# Patient Record
Sex: Female | Born: 1965 | Race: White | Hispanic: No | Marital: Married | Smoking: Current every day smoker
Health system: Southern US, Community
[De-identification: ages and names within clinical notes are randomized; demographics above are authoritative.]

## PROBLEM LIST (undated history)

## (undated) DIAGNOSIS — R011 Cardiac murmur, unspecified: Secondary | ICD-10-CM

## (undated) DIAGNOSIS — M545 Low back pain, unspecified: Secondary | ICD-10-CM

## (undated) DIAGNOSIS — N390 Urinary tract infection, site not specified: Secondary | ICD-10-CM

## (undated) DIAGNOSIS — Z9889 Other specified postprocedural states: Secondary | ICD-10-CM

## (undated) DIAGNOSIS — E119 Type 2 diabetes mellitus without complications: Secondary | ICD-10-CM

## (undated) DIAGNOSIS — F319 Bipolar disorder, unspecified: Secondary | ICD-10-CM

## (undated) DIAGNOSIS — R87619 Unspecified abnormal cytological findings in specimens from cervix uteri: Secondary | ICD-10-CM

## (undated) DIAGNOSIS — N183 Chronic kidney disease, stage 3 unspecified: Secondary | ICD-10-CM

## (undated) DIAGNOSIS — N200 Calculus of kidney: Secondary | ICD-10-CM

## (undated) DIAGNOSIS — K219 Gastro-esophageal reflux disease without esophagitis: Secondary | ICD-10-CM

## (undated) DIAGNOSIS — R531 Weakness: Secondary | ICD-10-CM

## (undated) DIAGNOSIS — K8020 Calculus of gallbladder without cholecystitis without obstruction: Secondary | ICD-10-CM

## (undated) DIAGNOSIS — Z8759 Personal history of other complications of pregnancy, childbirth and the puerperium: Secondary | ICD-10-CM

## (undated) DIAGNOSIS — G609 Hereditary and idiopathic neuropathy, unspecified: Secondary | ICD-10-CM

## (undated) DIAGNOSIS — Z8632 Personal history of gestational diabetes: Secondary | ICD-10-CM

## (undated) DIAGNOSIS — G43909 Migraine, unspecified, not intractable, without status migrainosus: Secondary | ICD-10-CM

## (undated) DIAGNOSIS — L97929 Non-pressure chronic ulcer of unspecified part of left lower leg with unspecified severity: Secondary | ICD-10-CM

## (undated) DIAGNOSIS — M1990 Unspecified osteoarthritis, unspecified site: Secondary | ICD-10-CM

## (undated) DIAGNOSIS — I10 Essential (primary) hypertension: Secondary | ICD-10-CM

## (undated) DIAGNOSIS — F419 Anxiety disorder, unspecified: Secondary | ICD-10-CM

## (undated) DIAGNOSIS — L97509 Non-pressure chronic ulcer of other part of unspecified foot with unspecified severity: Secondary | ICD-10-CM

## (undated) DIAGNOSIS — N3090 Cystitis, unspecified without hematuria: Secondary | ICD-10-CM

## (undated) DIAGNOSIS — B370 Candidal stomatitis: Secondary | ICD-10-CM

## (undated) DIAGNOSIS — F329 Major depressive disorder, single episode, unspecified: Secondary | ICD-10-CM

## (undated) DIAGNOSIS — R319 Hematuria, unspecified: Secondary | ICD-10-CM

## (undated) DIAGNOSIS — H269 Unspecified cataract: Secondary | ICD-10-CM

## (undated) DIAGNOSIS — Z8619 Personal history of other infectious and parasitic diseases: Secondary | ICD-10-CM

## (undated) DIAGNOSIS — E669 Obesity, unspecified: Secondary | ICD-10-CM

## (undated) DIAGNOSIS — R112 Nausea with vomiting, unspecified: Secondary | ICD-10-CM

## (undated) DIAGNOSIS — IMO0002 Reserved for concepts with insufficient information to code with codable children: Secondary | ICD-10-CM

## (undated) DIAGNOSIS — E785 Hyperlipidemia, unspecified: Secondary | ICD-10-CM

## (undated) DIAGNOSIS — Z87442 Personal history of urinary calculi: Secondary | ICD-10-CM

## (undated) DIAGNOSIS — K5730 Diverticulosis of large intestine without perforation or abscess without bleeding: Secondary | ICD-10-CM

## (undated) DIAGNOSIS — Z9289 Personal history of other medical treatment: Secondary | ICD-10-CM

## (undated) DIAGNOSIS — G4730 Sleep apnea, unspecified: Secondary | ICD-10-CM

## (undated) DIAGNOSIS — F32A Depression, unspecified: Secondary | ICD-10-CM

## (undated) DIAGNOSIS — I639 Cerebral infarction, unspecified: Secondary | ICD-10-CM

## (undated) DIAGNOSIS — A630 Anogenital (venereal) warts: Secondary | ICD-10-CM

## (undated) DIAGNOSIS — N179 Acute kidney failure, unspecified: Secondary | ICD-10-CM

## (undated) DIAGNOSIS — J189 Pneumonia, unspecified organism: Secondary | ICD-10-CM

## (undated) HISTORY — PX: CERVICAL CONE BIOPSY: SUR198

## (undated) HISTORY — PX: WISDOM TOOTH EXTRACTION: SHX21

## (undated) HISTORY — DX: Anogenital (venereal) warts: A63.0

## (undated) HISTORY — PX: ECTOPIC PREGNANCY SURGERY: SHX613

## (undated) HISTORY — DX: Unspecified abnormal cytological findings in specimens from cervix uteri: R87.619

## (undated) HISTORY — DX: Anxiety disorder, unspecified: F41.9

## (undated) HISTORY — PX: OVARIAN CYST REMOVAL: SHX89

## (undated) HISTORY — DX: Essential (primary) hypertension: I10

## (undated) HISTORY — PX: DILATION AND CURETTAGE OF UTERUS: SHX78

## (undated) HISTORY — PX: UNILATERAL SALPINGECTOMY: SHX6160

## (undated) HISTORY — DX: Hyperlipidemia, unspecified: E78.5

## (undated) HISTORY — DX: Reserved for concepts with insufficient information to code with codable children: IMO0002

---
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
## 1997-05-07 ENCOUNTER — Ambulatory Visit (HOSPITAL_COMMUNITY): Admission: RE | Admit: 1997-05-07 | Discharge: 1997-05-07 | Payer: Self-pay | Admitting: Obstetrics and Gynecology

## 1997-06-08 ENCOUNTER — Other Ambulatory Visit: Admission: RE | Admit: 1997-06-08 | Discharge: 1997-06-08 | Payer: Self-pay | Admitting: Obstetrics and Gynecology

## 1997-07-27 ENCOUNTER — Observation Stay (HOSPITAL_COMMUNITY): Admission: AD | Admit: 1997-07-27 | Discharge: 1997-07-27 | Payer: Self-pay | Admitting: Obstetrics and Gynecology

## 1997-10-11 ENCOUNTER — Ambulatory Visit (HOSPITAL_COMMUNITY): Admission: RE | Admit: 1997-10-11 | Discharge: 1997-10-11 | Payer: Self-pay | Admitting: Obstetrics and Gynecology

## 1997-10-23 ENCOUNTER — Encounter: Admission: RE | Admit: 1997-10-23 | Discharge: 1998-01-21 | Payer: Self-pay | Admitting: Obstetrics and Gynecology

## 1997-12-22 ENCOUNTER — Inpatient Hospital Stay: Admission: AD | Admit: 1997-12-22 | Discharge: 1997-12-22 | Payer: Self-pay | Admitting: Obstetrics and Gynecology

## 1997-12-25 ENCOUNTER — Inpatient Hospital Stay (HOSPITAL_COMMUNITY): Admission: AD | Admit: 1997-12-25 | Discharge: 1997-12-28 | Payer: Self-pay | Admitting: Obstetrics and Gynecology

## 1997-12-28 ENCOUNTER — Encounter (HOSPITAL_COMMUNITY): Admission: RE | Admit: 1997-12-28 | Discharge: 1998-03-28 | Payer: Self-pay | Admitting: Obstetrics and Gynecology

## 1998-02-11 ENCOUNTER — Other Ambulatory Visit: Admission: RE | Admit: 1998-02-11 | Discharge: 1998-02-11 | Payer: Self-pay | Admitting: Obstetrics and Gynecology

## 2000-02-25 ENCOUNTER — Other Ambulatory Visit: Admission: RE | Admit: 2000-02-25 | Discharge: 2000-02-25 | Payer: Self-pay | Admitting: Obstetrics and Gynecology

## 2000-04-08 ENCOUNTER — Inpatient Hospital Stay (HOSPITAL_COMMUNITY): Admission: AD | Admit: 2000-04-08 | Discharge: 2000-04-12 | Payer: Self-pay | Admitting: Obstetrics and Gynecology

## 2000-04-10 ENCOUNTER — Encounter: Payer: Self-pay | Admitting: Obstetrics and Gynecology

## 2000-04-11 ENCOUNTER — Encounter: Payer: Self-pay | Admitting: Pulmonary Disease

## 2000-10-28 ENCOUNTER — Other Ambulatory Visit: Admission: RE | Admit: 2000-10-28 | Discharge: 2000-10-28 | Payer: Self-pay | Admitting: Obstetrics and Gynecology

## 2001-03-08 ENCOUNTER — Encounter: Admission: RE | Admit: 2001-03-08 | Discharge: 2001-06-06 | Payer: Self-pay | Admitting: Obstetrics and Gynecology

## 2001-04-05 ENCOUNTER — Encounter: Payer: Self-pay | Admitting: Obstetrics and Gynecology

## 2001-04-05 ENCOUNTER — Inpatient Hospital Stay (HOSPITAL_COMMUNITY): Admission: AD | Admit: 2001-04-05 | Discharge: 2001-04-05 | Payer: Self-pay | Admitting: Obstetrics and Gynecology

## 2001-04-21 ENCOUNTER — Inpatient Hospital Stay (HOSPITAL_COMMUNITY): Admission: AD | Admit: 2001-04-21 | Discharge: 2001-04-24 | Payer: Self-pay | Admitting: Obstetrics and Gynecology

## 2001-04-21 ENCOUNTER — Encounter (INDEPENDENT_AMBULATORY_CARE_PROVIDER_SITE_OTHER): Payer: Self-pay

## 2001-06-14 ENCOUNTER — Other Ambulatory Visit: Admission: RE | Admit: 2001-06-14 | Discharge: 2001-06-14 | Payer: Self-pay | Admitting: Obstetrics and Gynecology

## 2001-06-17 ENCOUNTER — Inpatient Hospital Stay (HOSPITAL_COMMUNITY): Admission: AD | Admit: 2001-06-17 | Discharge: 2001-06-17 | Payer: Self-pay | Admitting: Obstetrics & Gynecology

## 2003-05-28 ENCOUNTER — Ambulatory Visit (HOSPITAL_COMMUNITY): Admission: RE | Admit: 2003-05-28 | Discharge: 2003-05-28 | Payer: Self-pay | Admitting: Family Medicine

## 2003-05-28 IMAGING — CR DG ANKLE COMPLETE 3+V*L*
2 series · 2 of 2 positions shown · non-contrast
Comparison: none

CLINICAL DATA: Status post fall with pain along dorsal surface of foot and ankle.
 LEFT FOOT 3 VIEWS  - [DATE]
 No acute bony abnormality.  Specifically, no evidence for fracture, subluxation, or dislocation.  Soft tissues are intact.
 IMPRESSION
 No acute bony abnormality.
 LEFT ANKLE 3 VIEWS ? [DATE]
 There is an area of cortical thickening noted within the distal lateral aspect of the left tibia.  This could represent a stress reaction or possibly an ossifying fibroma.  This has a benign appearance.  No acute bony abnormality.  Specifically no evidence of fracture, subluxation, or dislocation about the ankle.
 1.  Sclerotic area with cortical thickening in the distal lateral left tibia most compatible with a benign process such as stress reaction or ossifying fibroma or other benign condition.
 2.  No acute bony abnormality.

[view not recorded (1 of 2)]
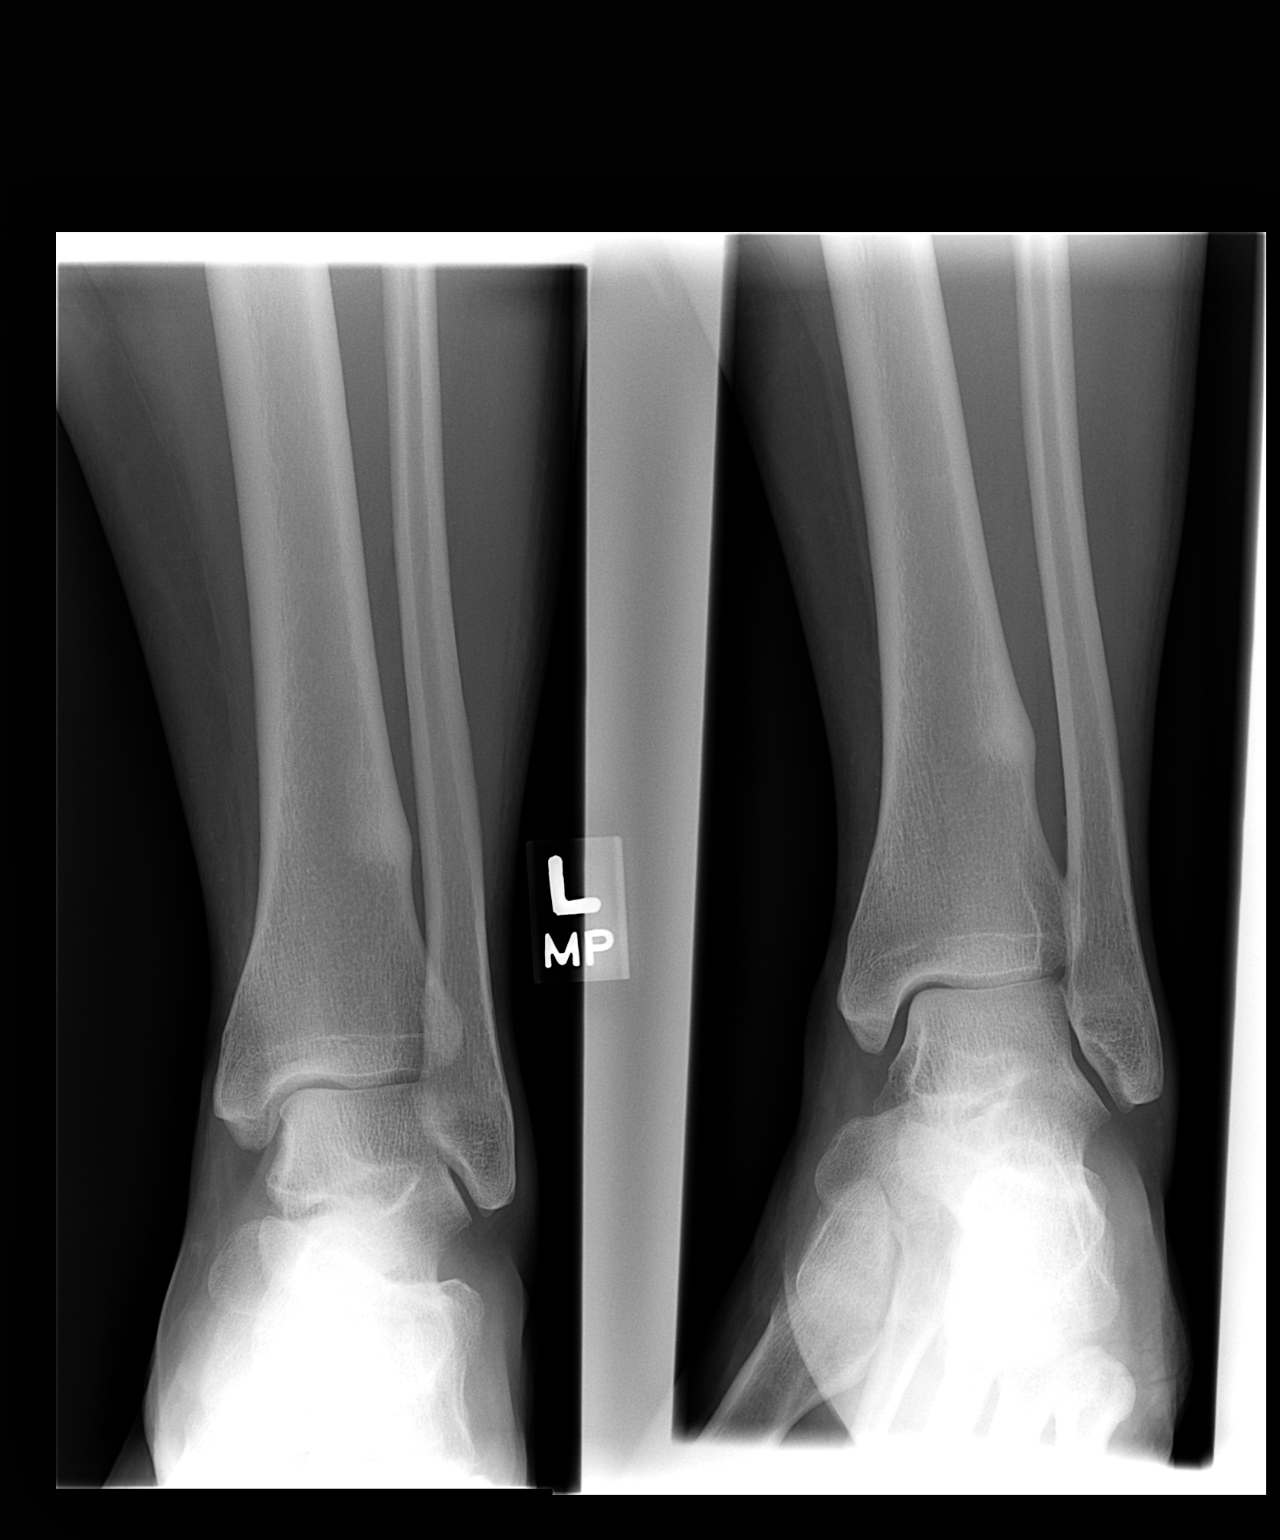

[view not recorded (2 of 2)]
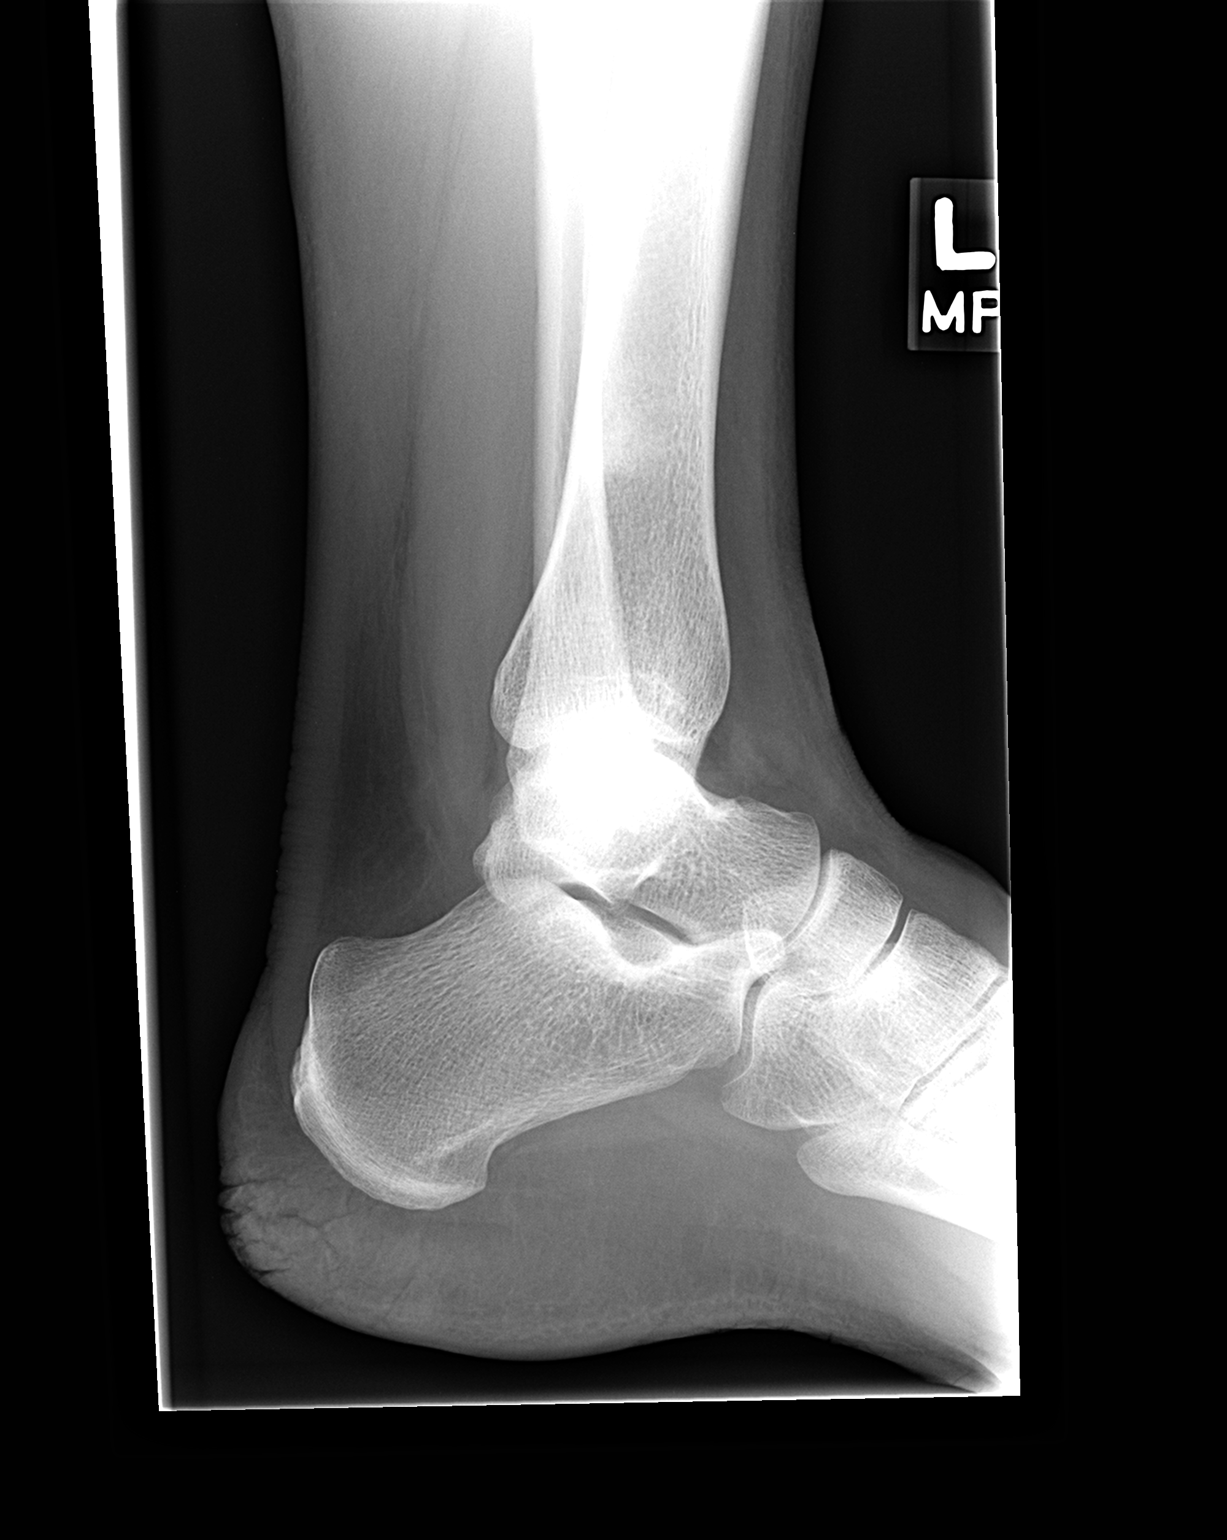

[2 of 2 positions shown; findings below may reference images not displayed]

## 2003-05-28 IMAGING — CR DG FOOT COMPLETE 3+V*L*
2 series · 2 of 2 positions shown · non-contrast
Comparison: none

CLINICAL DATA: Status post fall with pain along dorsal surface of foot and ankle.
 LEFT FOOT 3 VIEWS  - [DATE]
 No acute bony abnormality.  Specifically, no evidence for fracture, subluxation, or dislocation.  Soft tissues are intact.
 IMPRESSION
 No acute bony abnormality.
 LEFT ANKLE 3 VIEWS ? [DATE]
 There is an area of cortical thickening noted within the distal lateral aspect of the left tibia.  This could represent a stress reaction or possibly an ossifying fibroma.  This has a benign appearance.  No acute bony abnormality.  Specifically no evidence of fracture, subluxation, or dislocation about the ankle.
 1.  Sclerotic area with cortical thickening in the distal lateral left tibia most compatible with a benign process such as stress reaction or ossifying fibroma or other benign condition.
 2.  No acute bony abnormality.

[view not recorded (1 of 2)]
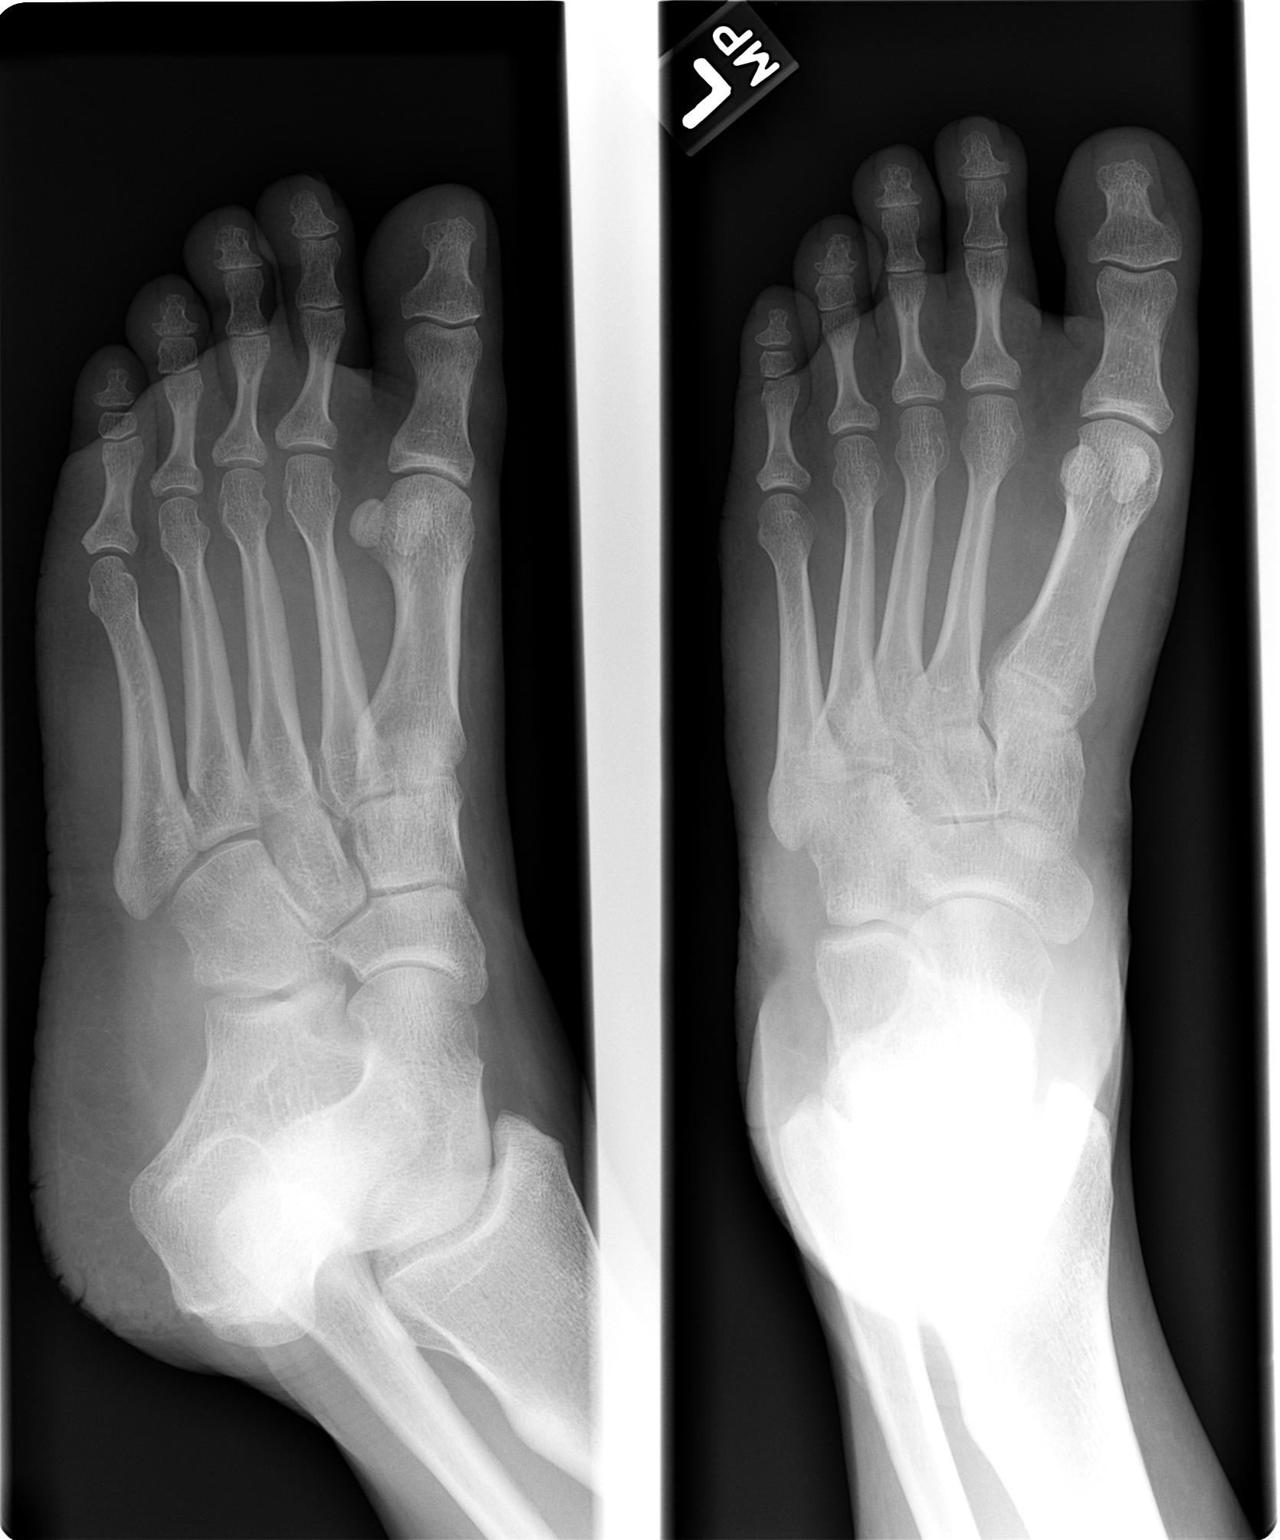

[view not recorded (2 of 2)]
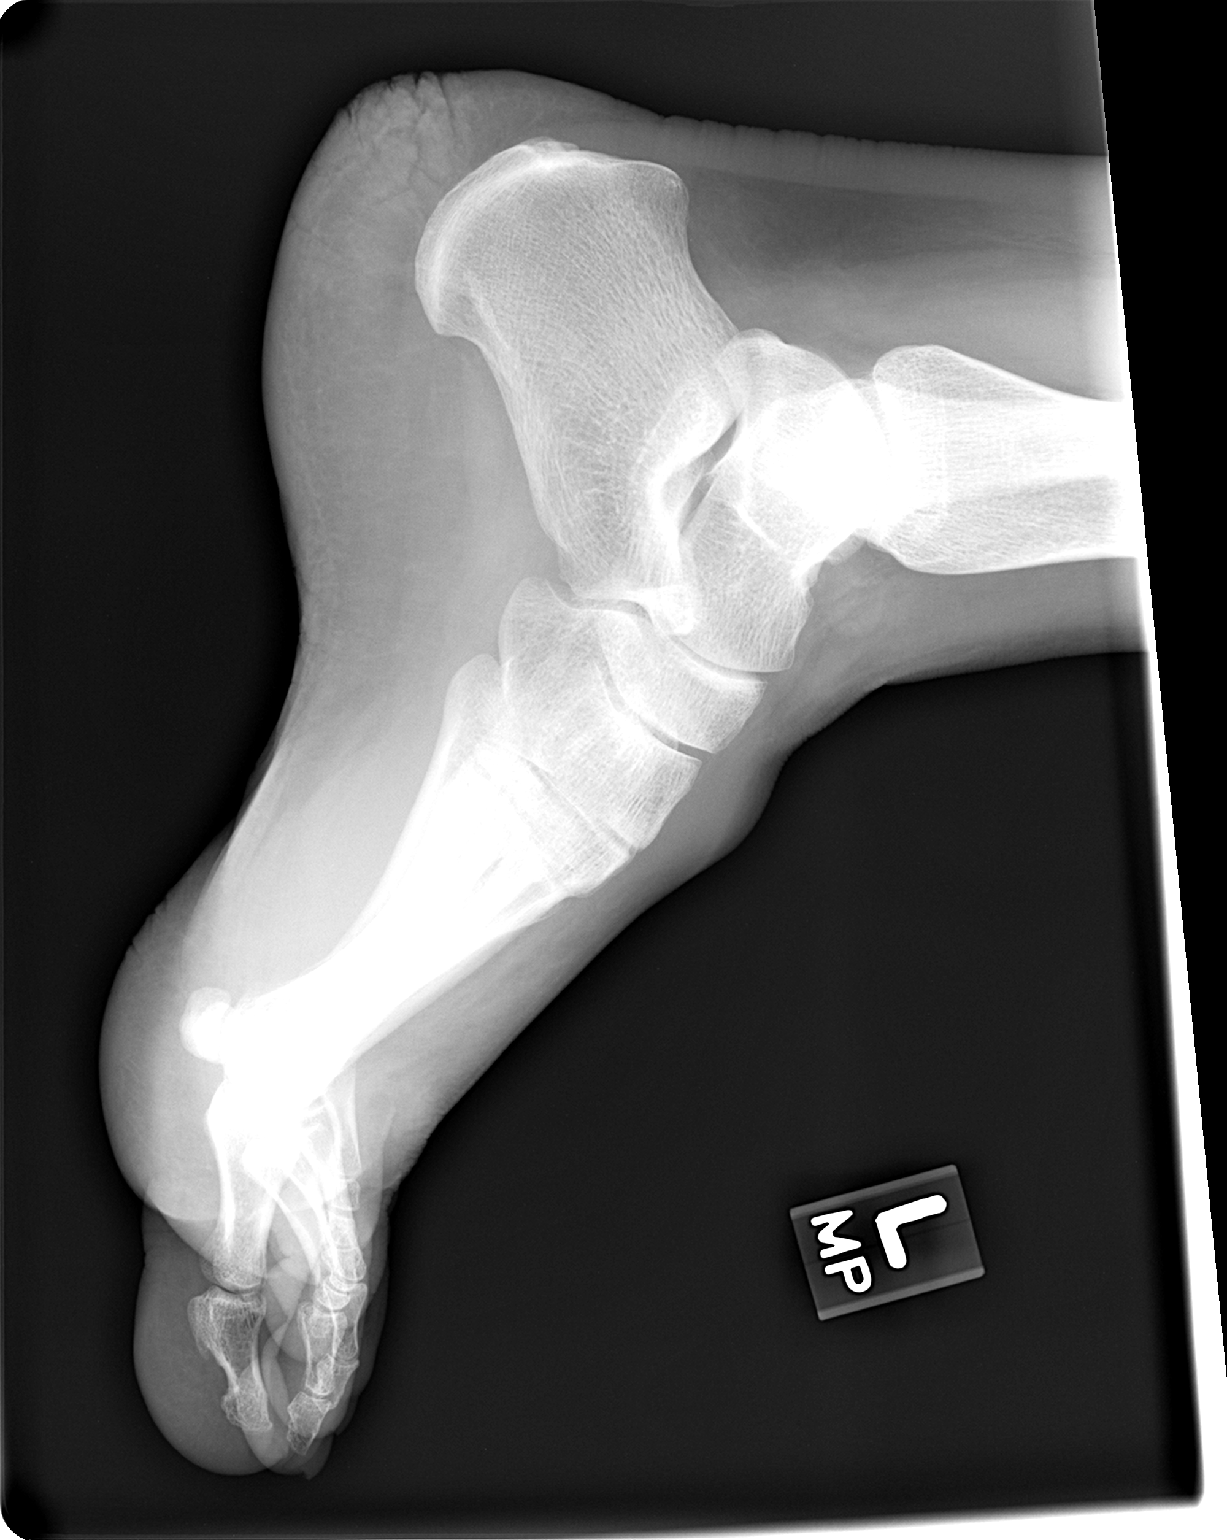

[2 of 2 positions shown; findings below may reference images not displayed]

## 2003-10-17 ENCOUNTER — Ambulatory Visit (HOSPITAL_COMMUNITY): Admission: RE | Admit: 2003-10-17 | Discharge: 2003-10-17 | Payer: Self-pay | Admitting: Obstetrics and Gynecology

## 2003-10-17 IMAGING — US US OB TRANSVAGINAL MODIFY
1 series · 14 of 26 positions shown · non-contrast
Comparison: none

CLINICAL DATA: LMP [DATE] P3 AB2 Ectopic 1.  Assess for fetal life.
 EARLY OBSTETRICAL ULTRASOUND WITH TRANSVAGINAL:
 Transabdominal and transvaginal scanning of the pelvis was performed. Study is difficult due to patient?s large size.
 There is an intrauterine gestational sac containing a single living embryo with a regular heart rate of 147 bpm.  By crown rump length, the gestation is estimated at 7 weeks 3 days.  This yields sonographic EDC of [DATE].  It was difficult to clearly define the yolk sac.  No subchorionic hemorrhage is noted.
 Left ovary is normal.  Right ovary could not be seen by either scanning route.  
 IMPRESSION
 Single living intrauterine embryo with mean gestational age of 7 weeks 3 days and sonographic EDC of [DATE].  
 Normal left ovary.  Right ovary not seen.

[Series 1: unknown · 0.41mm/px · 14 of 26 slices shown]
[im 1/26]
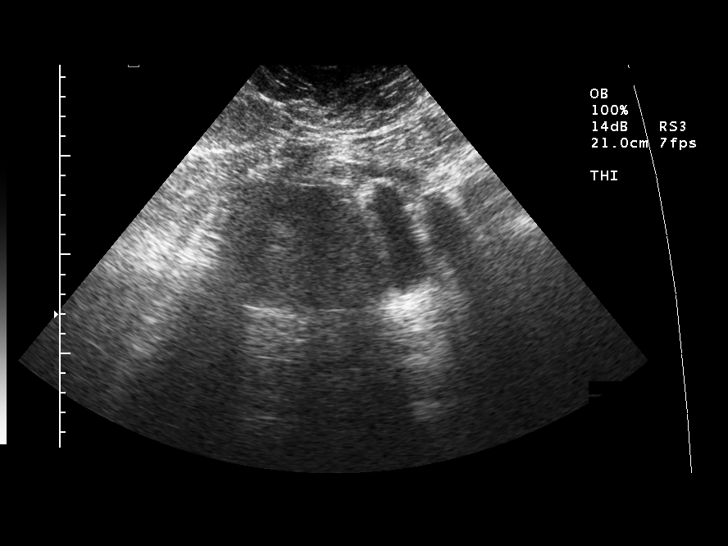
[im 3/26]
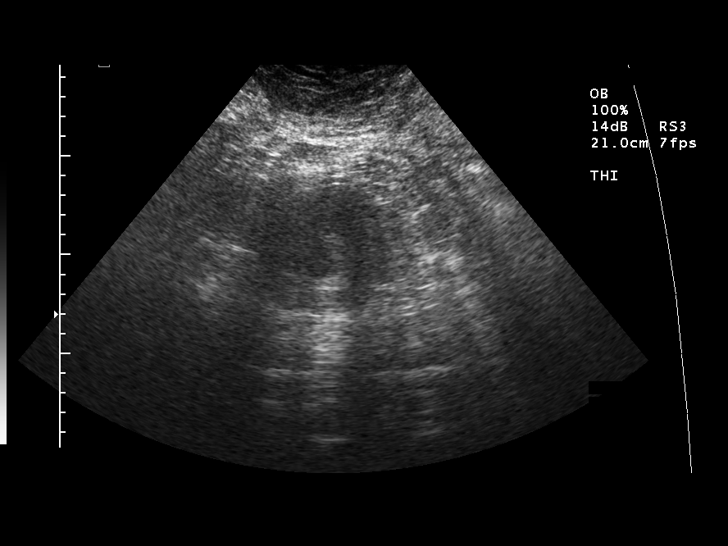
[im 5/26]
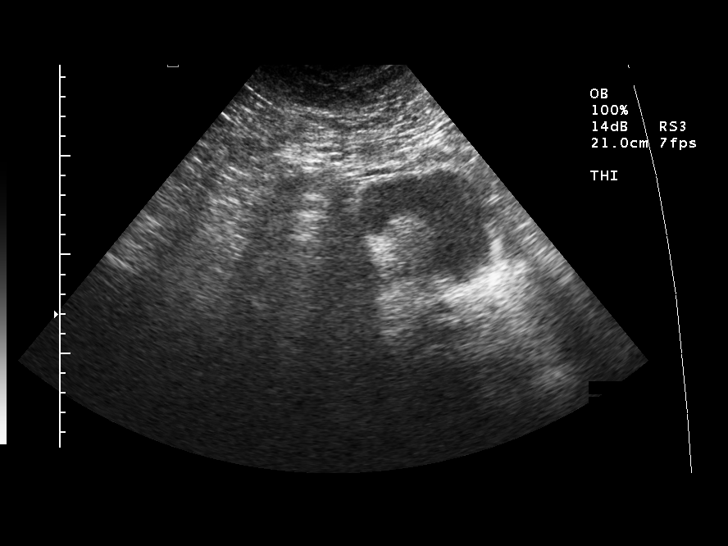
[im 7/26]
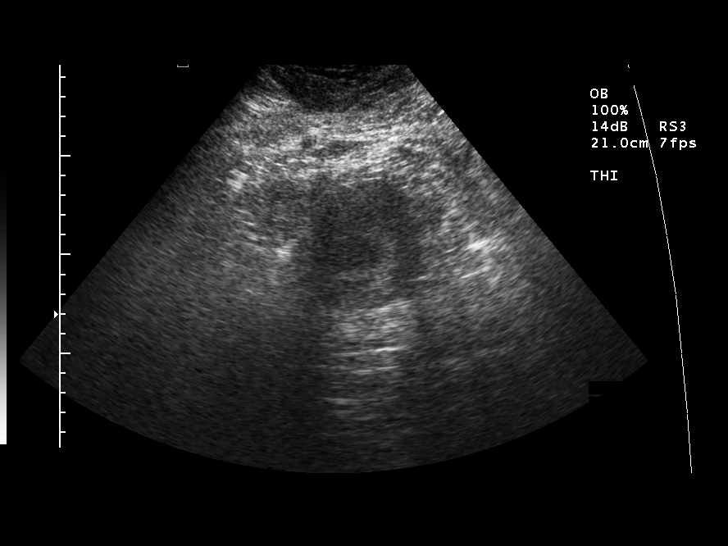
[im 9/26]
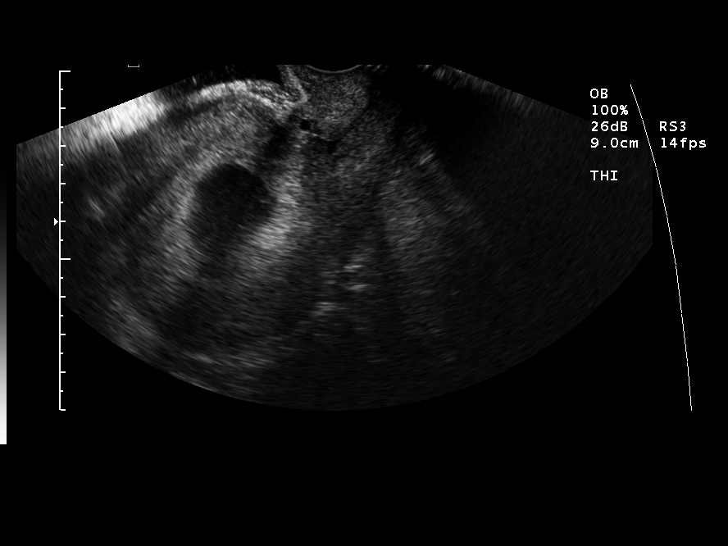
[im 11/26]
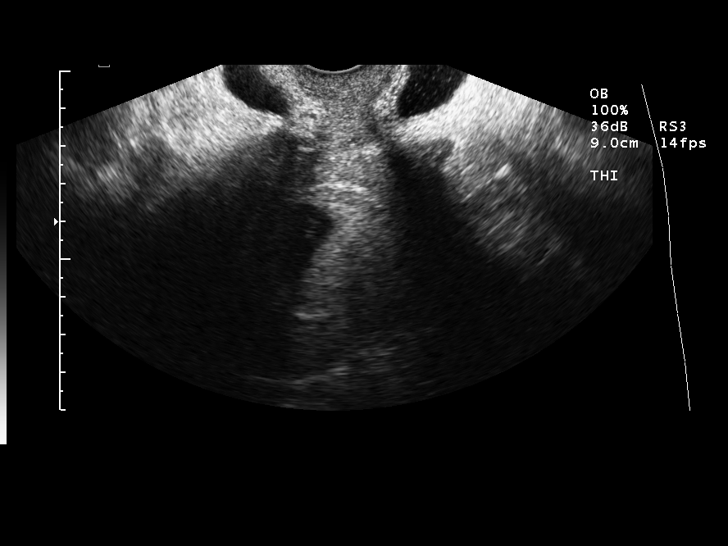
[im 13/26]
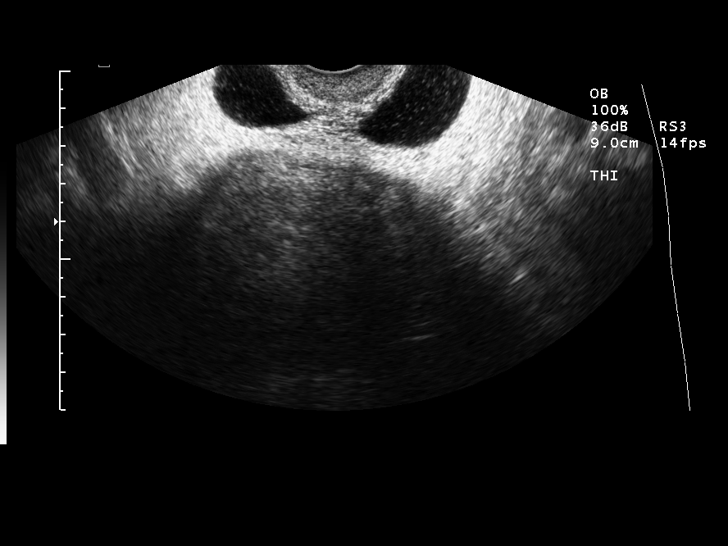
[im 14/26]
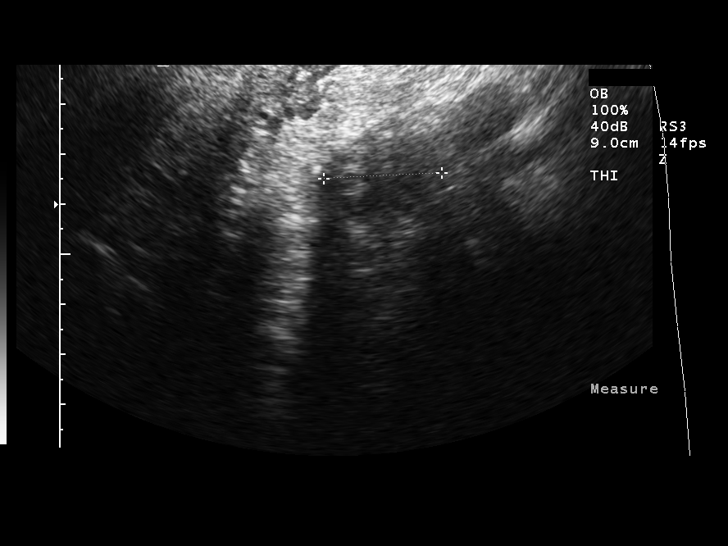
[im 16/26]
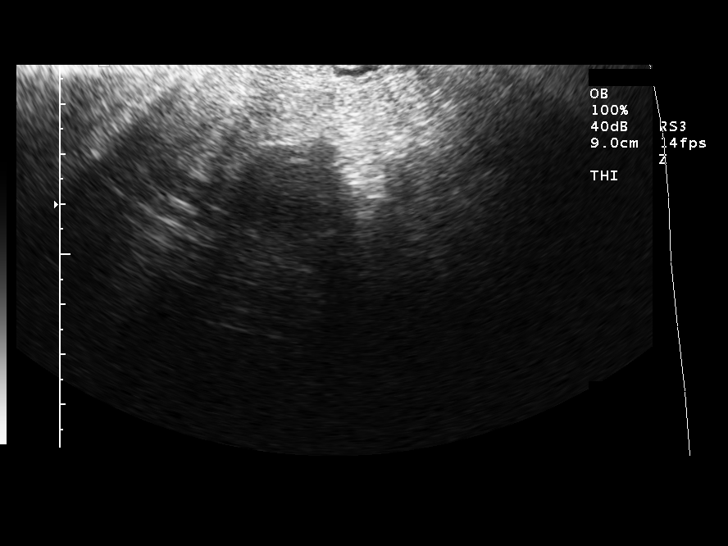
[im 18/26]
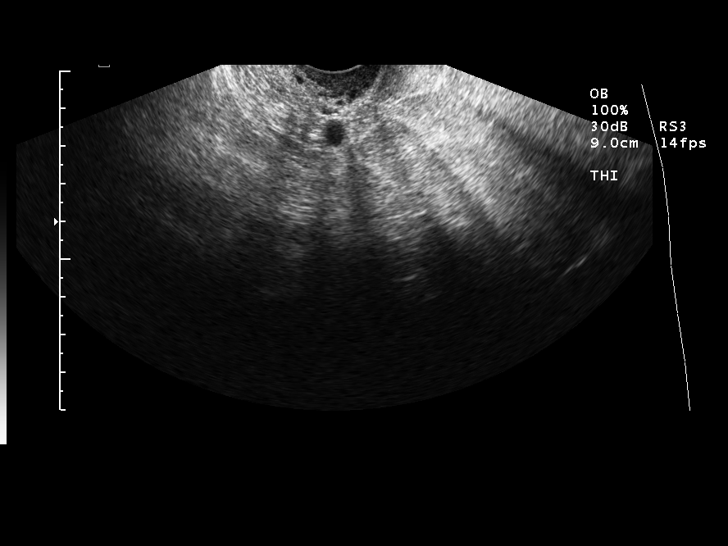
[im 20/26]
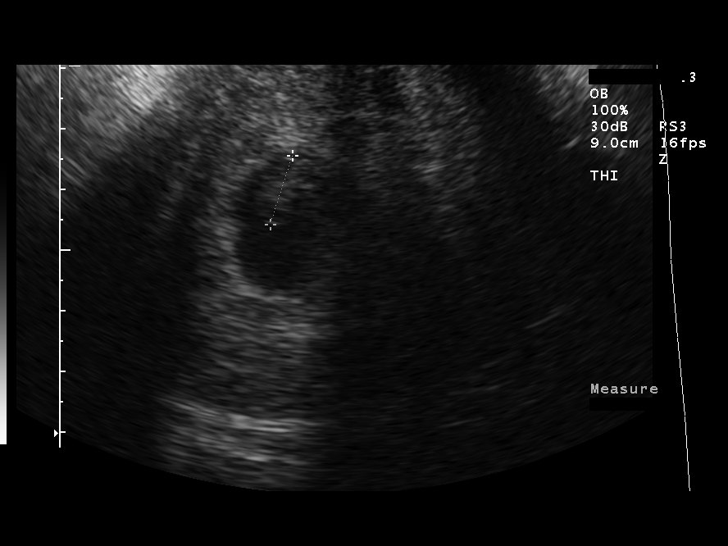
[im 22/26]
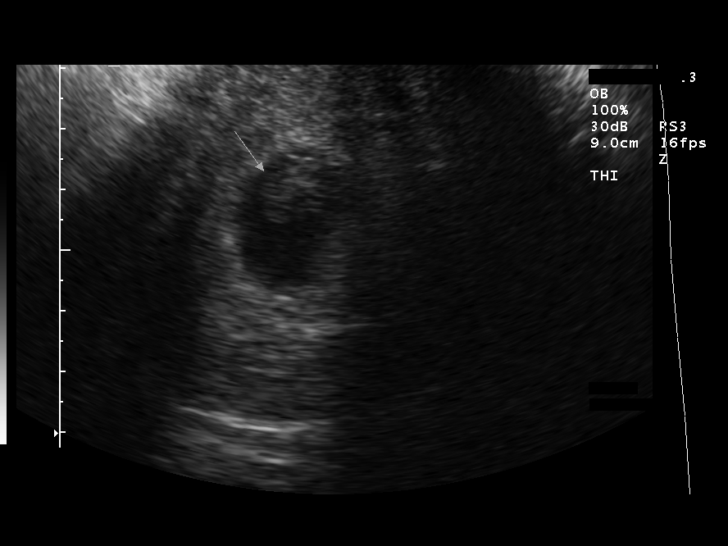
[im 24/26]
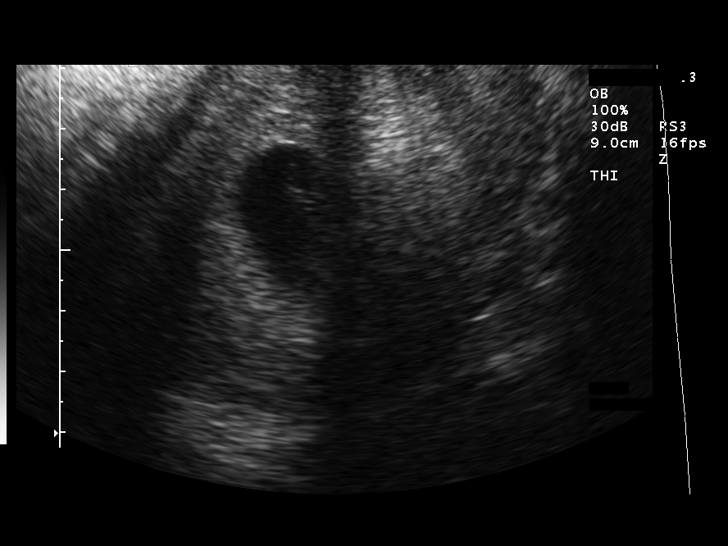
[im 26/26]
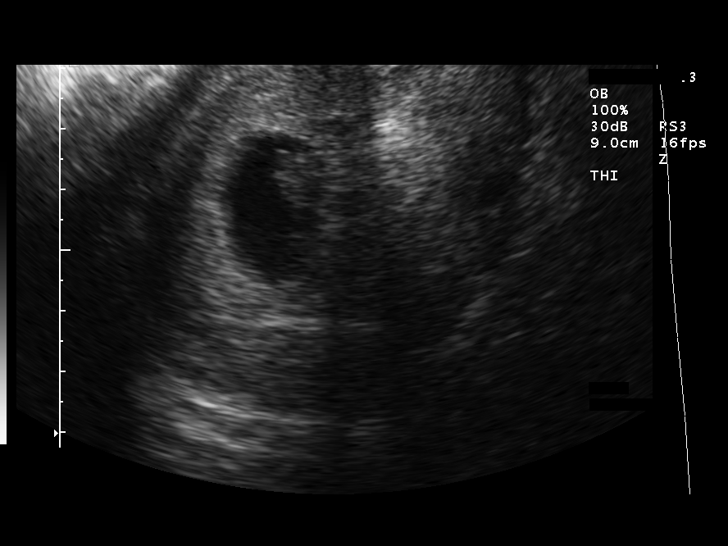

[14 of 26 positions shown; findings below may reference images not displayed]

## 2003-10-31 ENCOUNTER — Encounter: Admission: RE | Admit: 2003-10-31 | Discharge: 2003-10-31 | Payer: Self-pay | Admitting: *Deleted

## 2003-11-14 ENCOUNTER — Encounter: Admission: RE | Admit: 2003-11-14 | Discharge: 2003-11-14 | Payer: Self-pay | Admitting: *Deleted

## 2003-11-14 ENCOUNTER — Encounter (INDEPENDENT_AMBULATORY_CARE_PROVIDER_SITE_OTHER): Payer: Self-pay | Admitting: *Deleted

## 2003-11-15 ENCOUNTER — Encounter: Admission: RE | Admit: 2003-11-15 | Discharge: 2003-11-15 | Payer: Self-pay | Admitting: Family Medicine

## 2003-11-28 ENCOUNTER — Ambulatory Visit: Payer: Self-pay | Admitting: *Deleted

## 2003-12-05 ENCOUNTER — Ambulatory Visit: Payer: Self-pay | Admitting: Family Medicine

## 2003-12-19 ENCOUNTER — Ambulatory Visit: Payer: Self-pay | Admitting: Obstetrics & Gynecology

## 2003-12-24 ENCOUNTER — Inpatient Hospital Stay (HOSPITAL_COMMUNITY): Admission: AD | Admit: 2003-12-24 | Discharge: 2003-12-24 | Payer: Self-pay | Admitting: Family Medicine

## 2003-12-31 ENCOUNTER — Inpatient Hospital Stay (HOSPITAL_COMMUNITY): Admission: AD | Admit: 2003-12-31 | Discharge: 2003-12-31 | Payer: Self-pay | Admitting: *Deleted

## 2004-01-01 ENCOUNTER — Encounter: Admission: RE | Admit: 2004-01-01 | Discharge: 2004-01-01 | Payer: Self-pay | Admitting: *Deleted

## 2004-01-02 ENCOUNTER — Inpatient Hospital Stay (HOSPITAL_COMMUNITY): Admission: RE | Admit: 2004-01-02 | Discharge: 2004-01-06 | Payer: Self-pay | Admitting: Family Medicine

## 2004-01-02 ENCOUNTER — Ambulatory Visit: Payer: Self-pay | Admitting: Obstetrics and Gynecology

## 2004-01-02 IMAGING — US US OB DETAIL+14 WK
1 series · 13 of 28 positions shown · non-contrast
Comparison: none

CLINICAL DATA: 18 week 3 day assigned gestational age by early ultrasound.  Advanced maternal age.  History of cervical incompetence.

[Series 1: us ob detail+14 wk · 0.47mm/px · 13 of 68 slices shown]
[im 3/68]
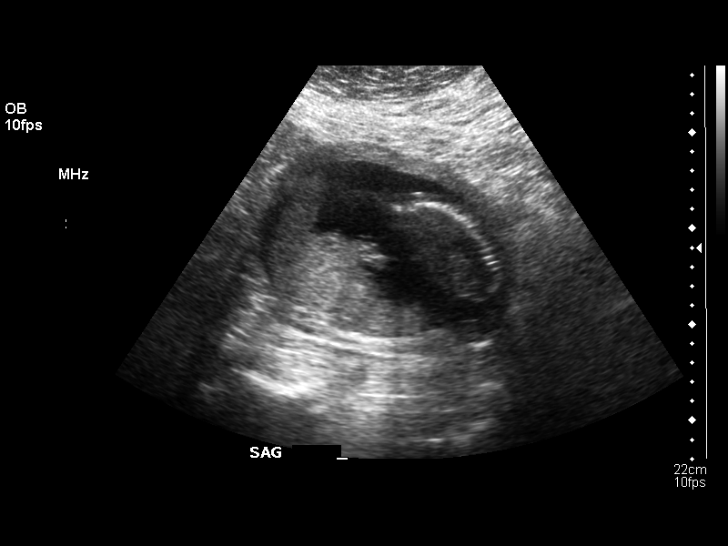
[im 8/68]
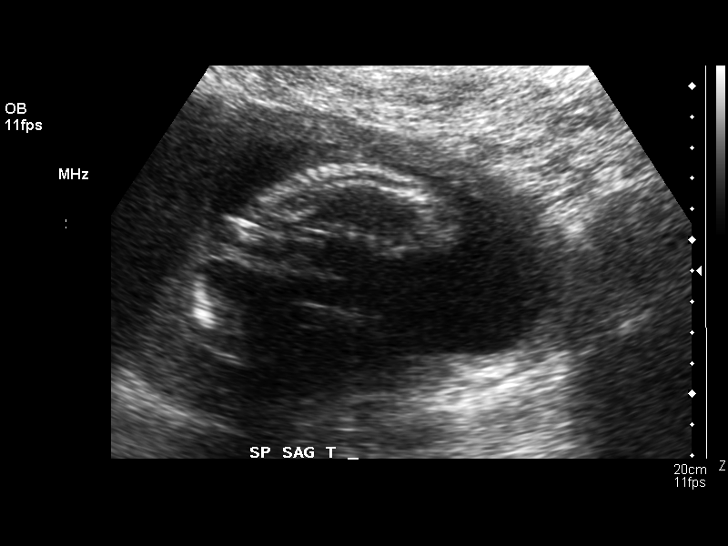
[im 13/68]
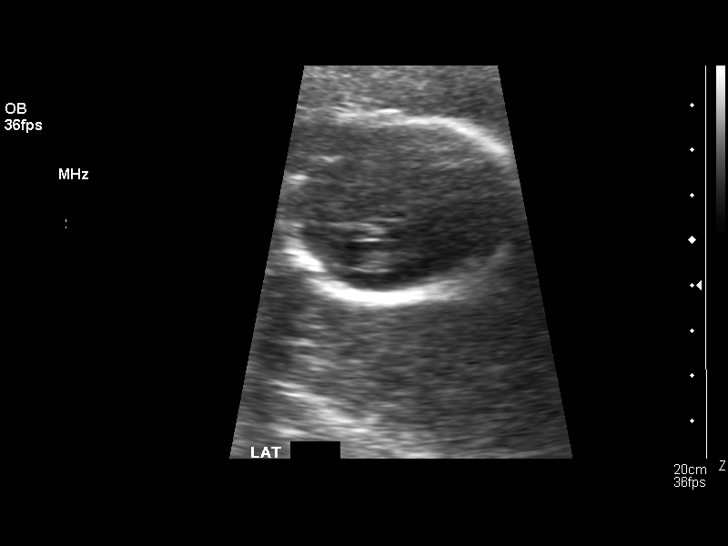
[im 18/68]
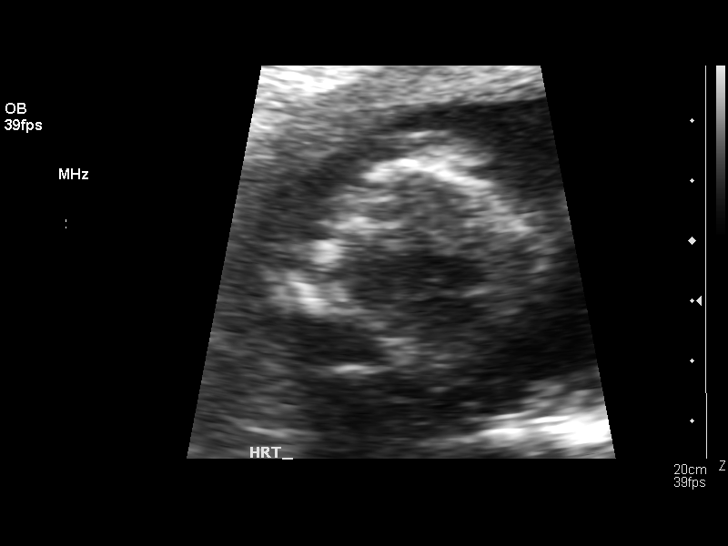
[im 23/68]
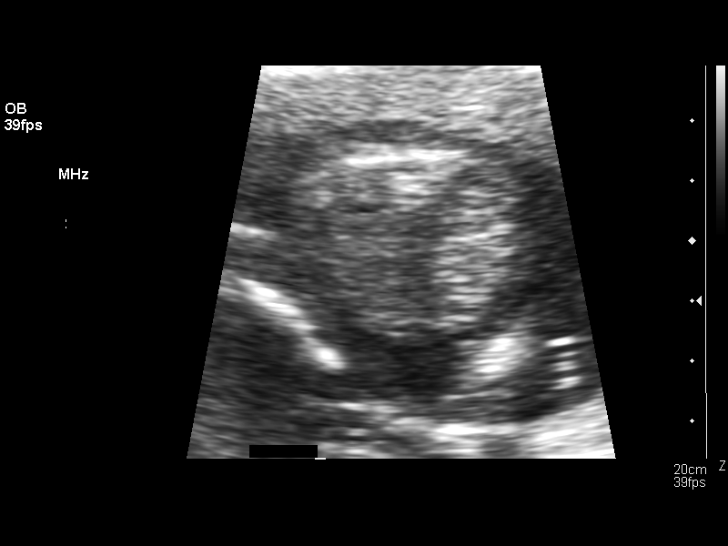
[im 28/68]
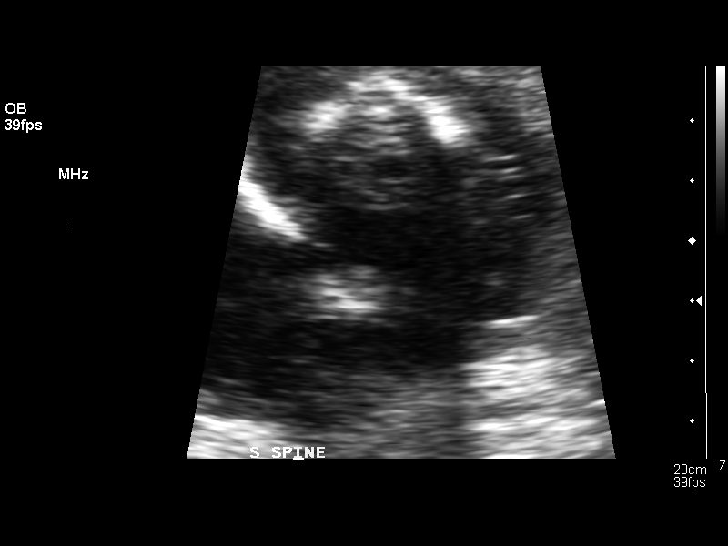
[im 35/68]
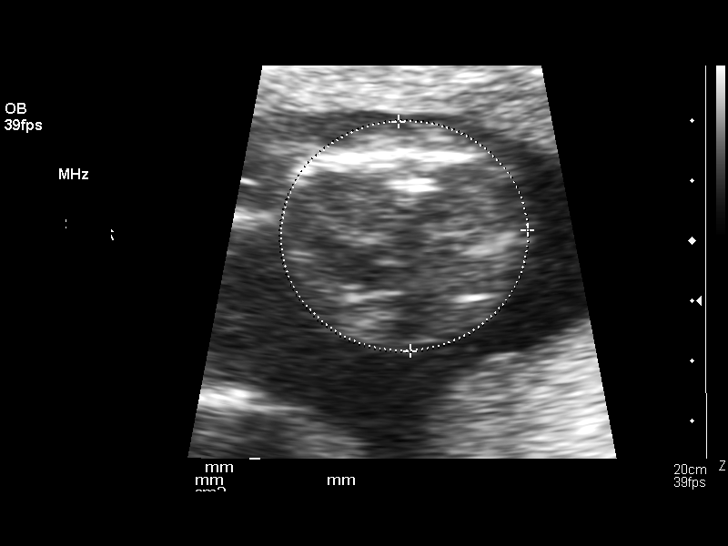
[im 40/68]
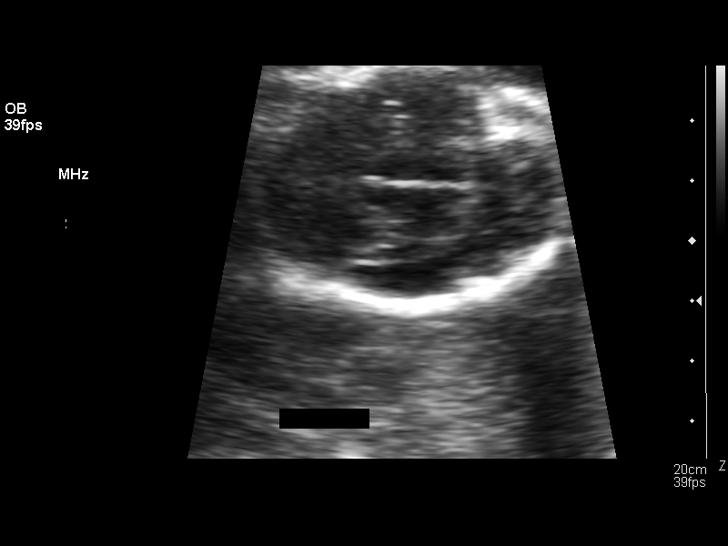
[im 45/68]
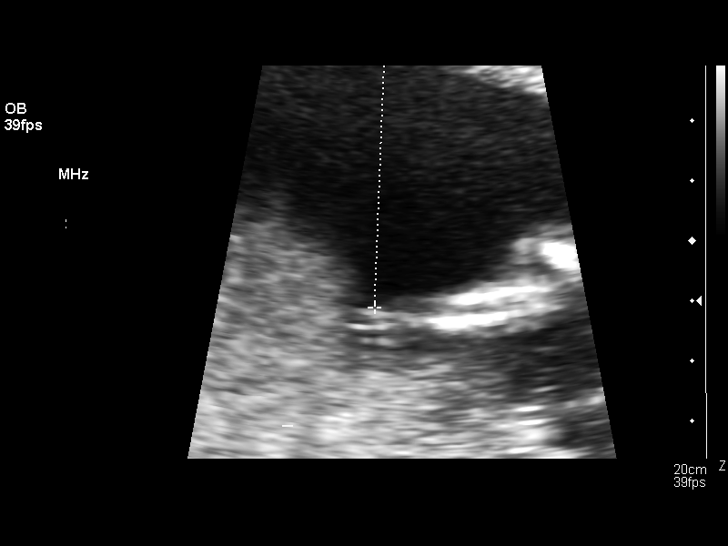
[im 50/68]
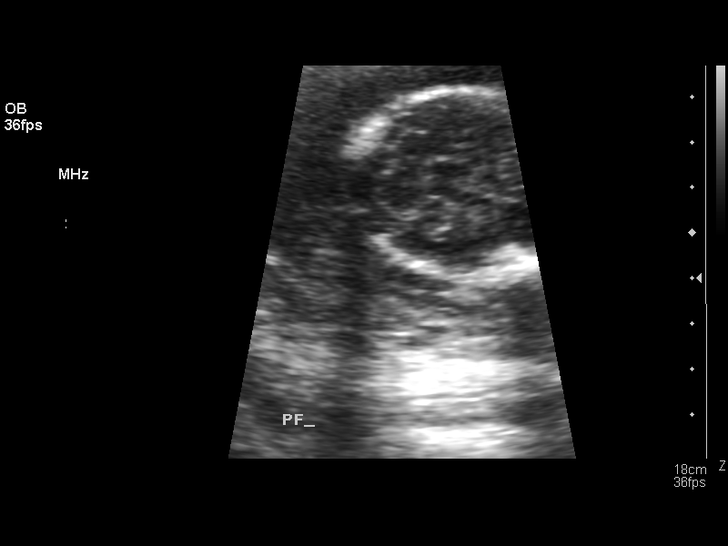
[im 55/68]
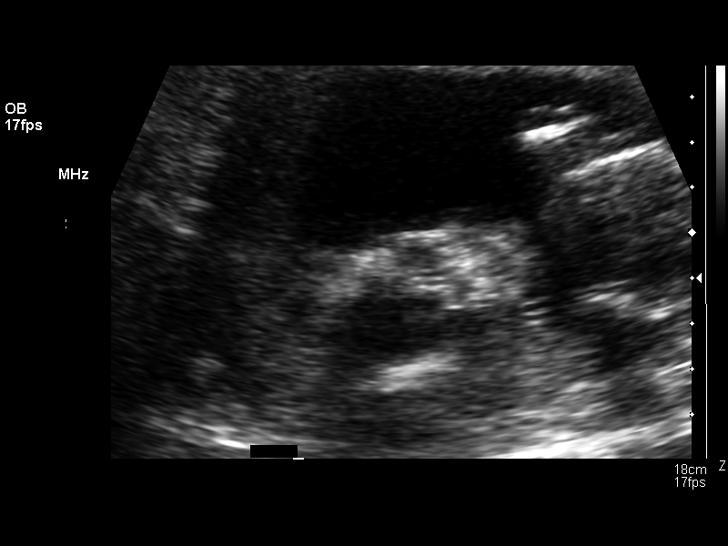
[im 60/68]
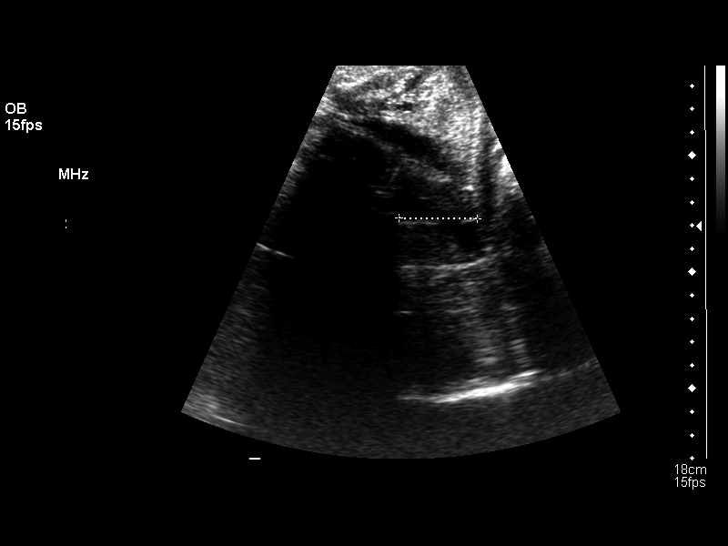
[im 65/68]
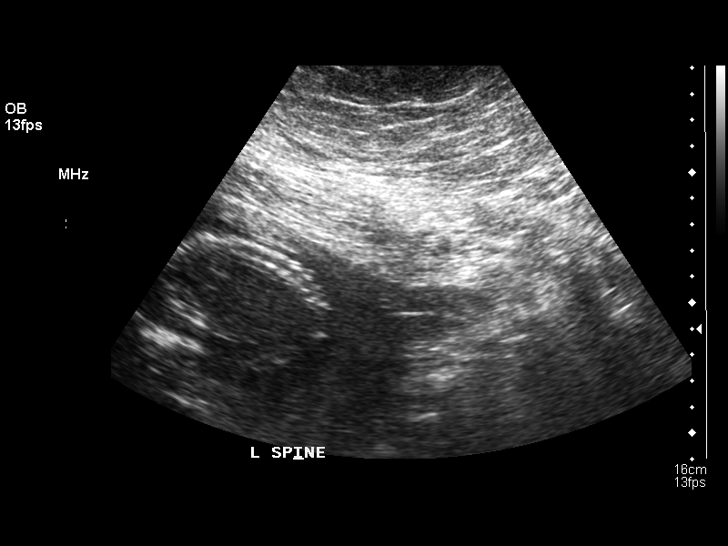

[13 of 28 positions shown; findings below may reference images not displayed]

DETAILED OBSTETRICAL ULTRASOUND WITH TRANSVAGINAL:

 Number of Fetuses:  1
 Heart Rate:  168
 Movement:  Yes
 Breathing:  No
 Presentation:  Breech
 Placental Location:  Fundal, posterior
 Grade:  I
 Previa:  No
 Amniotic Fluid (Subjective):  Normal
 Amniotic Fluid (Objective):  4.3 cm Vertical pocket 

 FETAL BIOMETRY
 BPD:  4.1 cm   18 w 3 d
 HC:  15.6 cm   18 w 4 d
 AC:  12.7 cm  18 w 2 d
 FL:   3.4 cm  20 w 5 d
 HL:   2.9 cm   19 w 2 d

 MEAN GA:  19 w 0 d

 FETAL ANATOMY
 Lateral Ventricles:  Visualized 
 Thalami/CSP:  Visualized 
 Posterior Fossa:  Visualized 
 Nuchal Region:  Visualized 
 Spine:  Limited
 4 Chamber Heart on Left:  Not visualized 
 Stomach on Left:  Visualized 
 3 Vessel Cord:  Visualized 
 Cord Insertion Site:  Visualized 
 Kidneys:  Visualized 
 Bladder:  Visualized 
 Extremities:  Visualized 

 ADDITIONAL ANATOMY VISUALIZED:  Orbits and diaphragm.  

 Evaluation limited by:  Maternal habitus and fetal position.

 MATERNAL FINDINGS
 Cervix:  Cervix is open, with bulging membranes extending into the vagina.
IMPRESSION: Assigned gestational age by early ultrasound is currently 18 weeks 3 days.  Appropriate fetal growth.  
 Limited anatomic evaluation possible due to maternal habitus.  No fetal anatomic abnormality identified.
 Cervix is open with bulging membranes noted.  This report was called to the clinic, and patient was taken to [facility] immediately following this study.  

 </u12:p>

## 2004-01-08 ENCOUNTER — Inpatient Hospital Stay (HOSPITAL_COMMUNITY): Admission: AD | Admit: 2004-01-08 | Discharge: 2004-01-08 | Payer: Self-pay | Admitting: *Deleted

## 2004-01-10 ENCOUNTER — Ambulatory Visit: Payer: Self-pay | Admitting: Family Medicine

## 2004-01-15 ENCOUNTER — Ambulatory Visit: Payer: Self-pay | Admitting: *Deleted

## 2004-01-15 ENCOUNTER — Ambulatory Visit (HOSPITAL_COMMUNITY): Admission: RE | Admit: 2004-01-15 | Discharge: 2004-01-15 | Payer: Self-pay | Admitting: *Deleted

## 2004-01-15 IMAGING — US US OB FOLLOW-UP
1 series · 13 of 28 positions shown · non-contrast
Comparison: [DATE].

CLINICAL DATA: Reassess anatomy.  

 OBSTETRICAL ULTRASOUND RE-EVALUATION WITH TRANSVAGINAL:

[Series 1: us ob follow-up · 0.41mm/px · 13 of 32 slices shown]
[im 2/32]
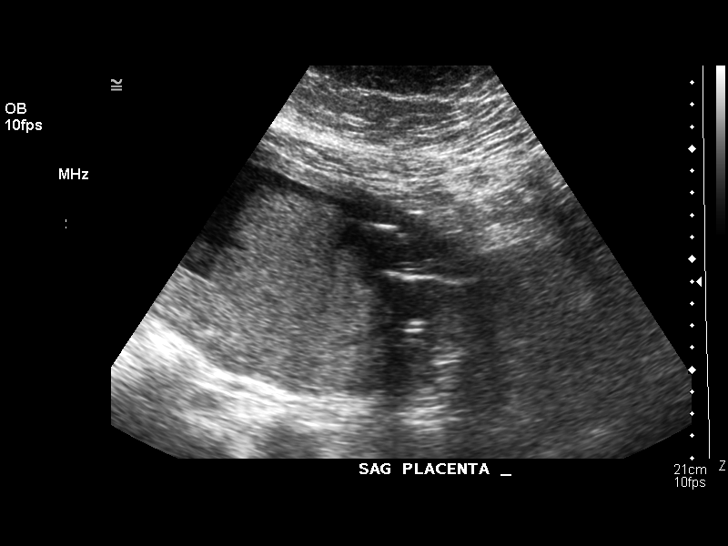
[im 4/32]
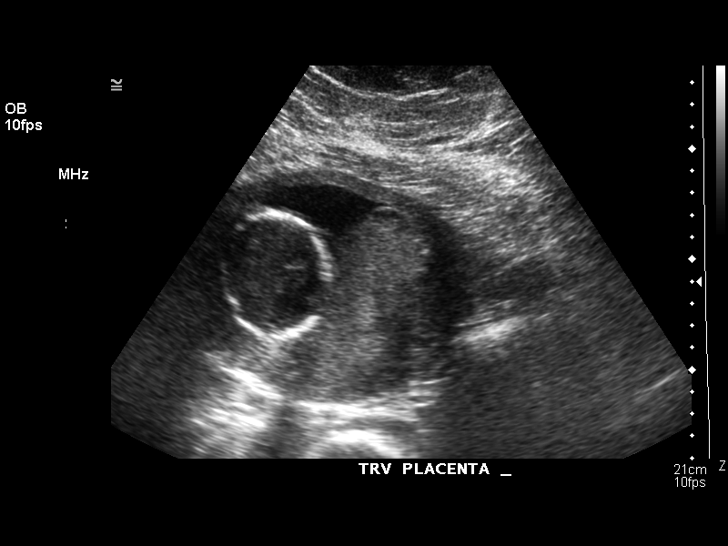
[im 6/32]
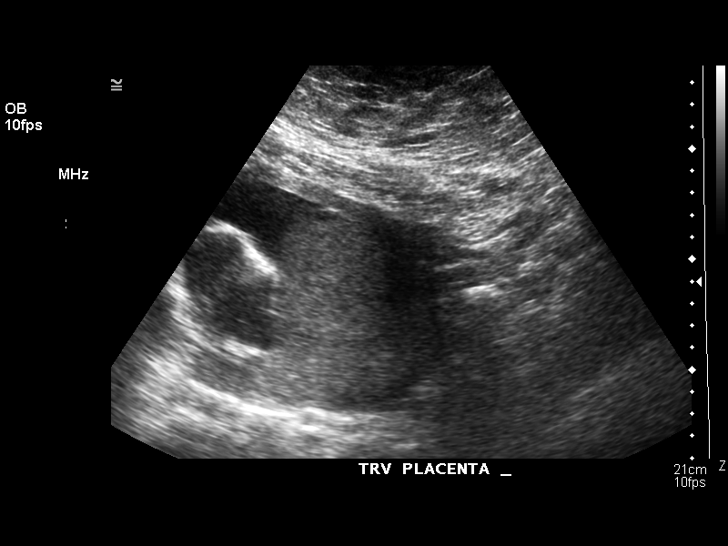
[im 9/32]
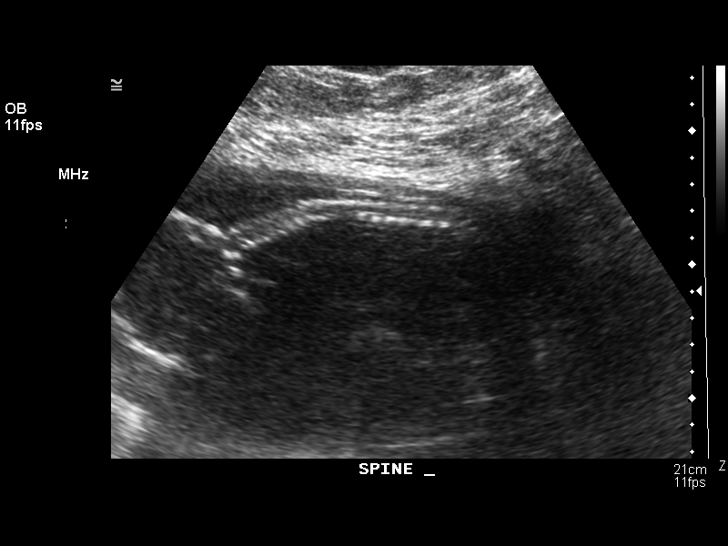
[im 11/32]
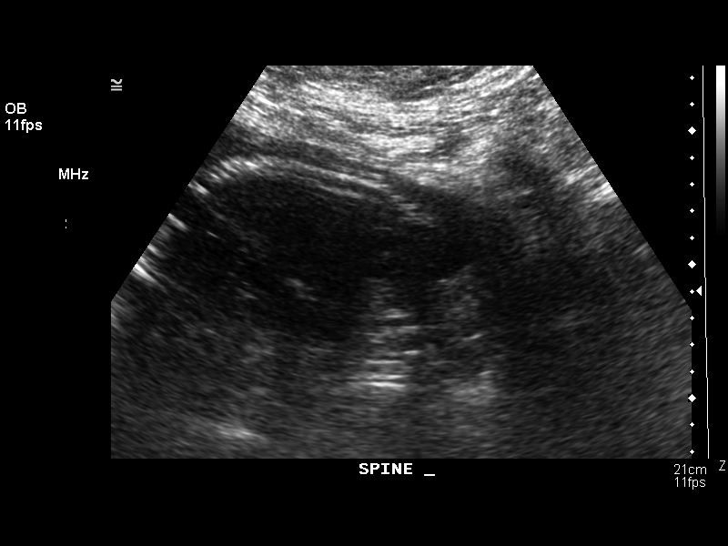
[im 13/32]
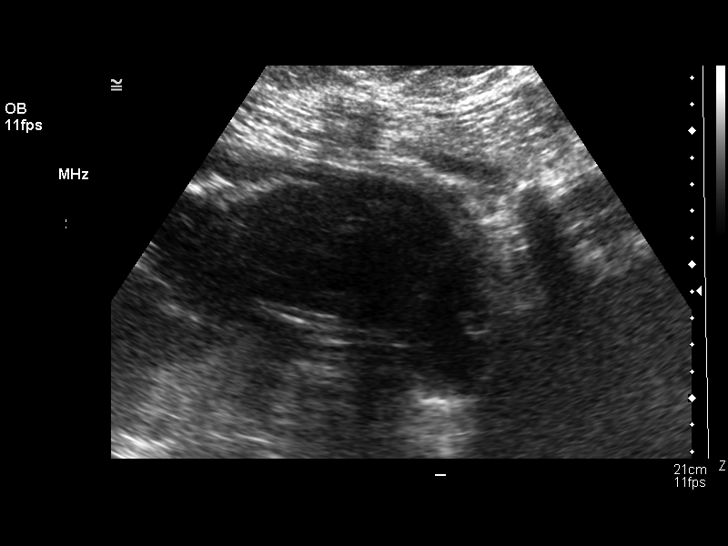
[im 17/32]
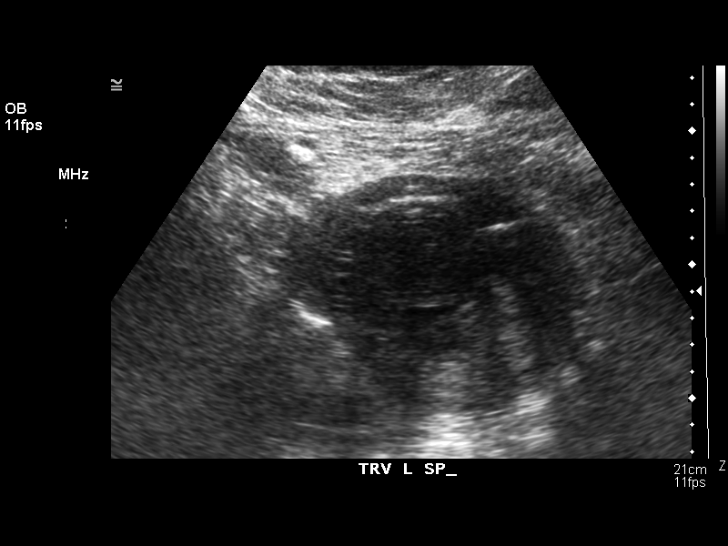
[im 19/32]
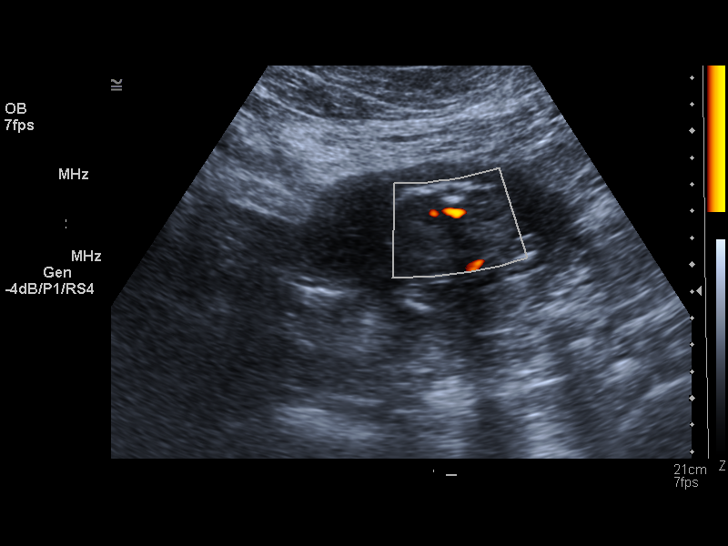
[im 21/32]
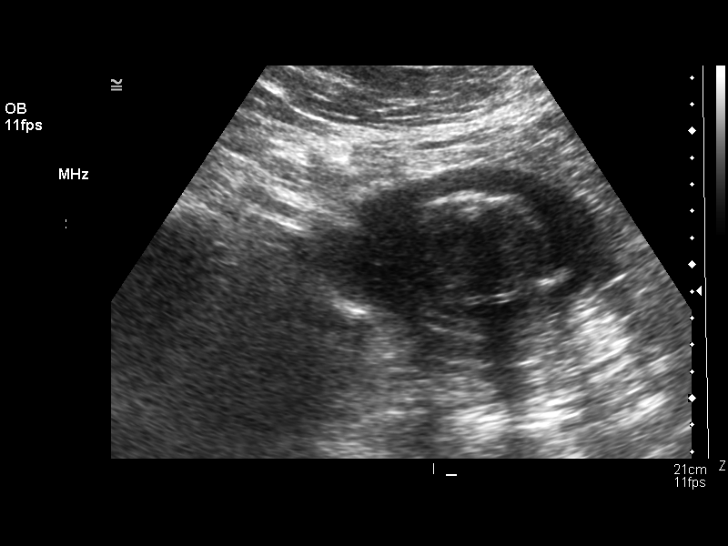
[im 23/32]
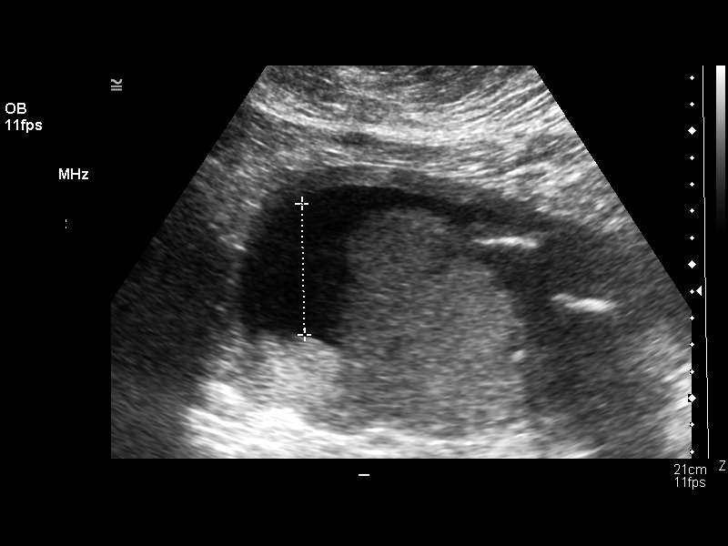
[im 26/32]
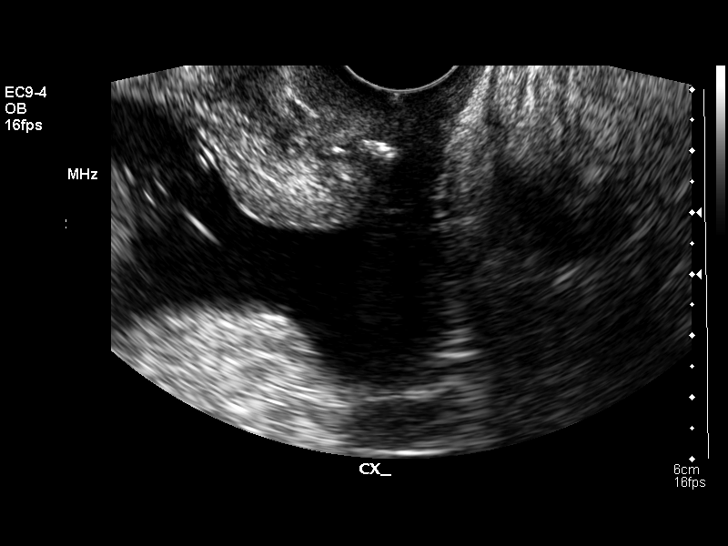
[im 28/32]
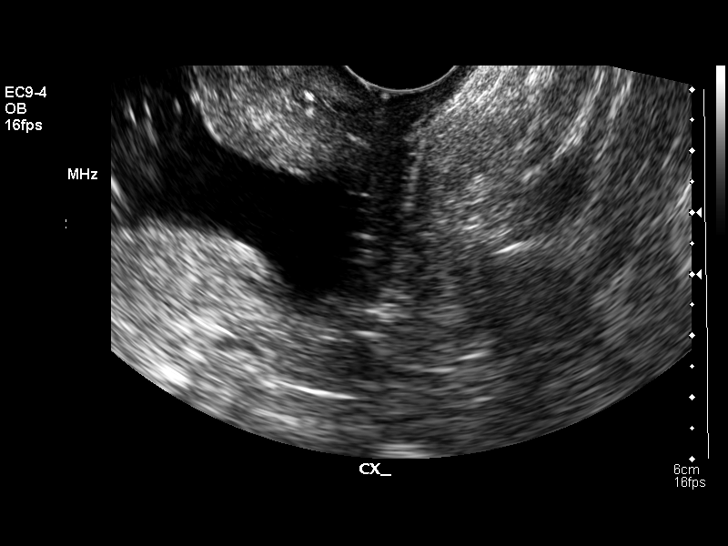
[im 30/32]
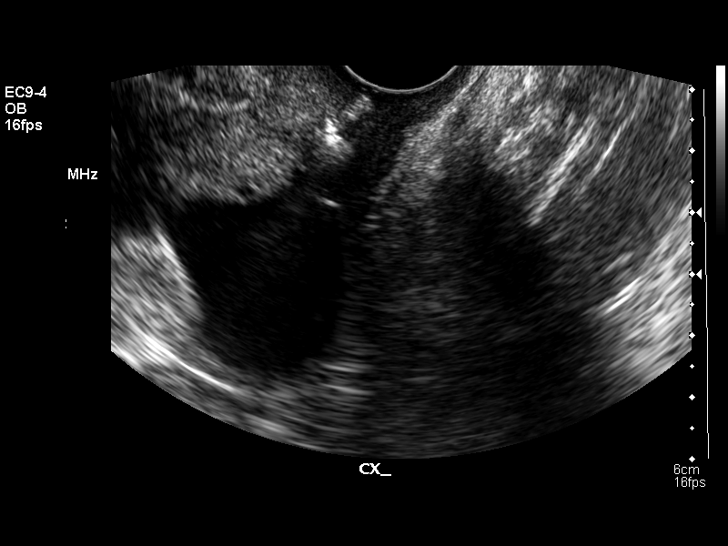

[13 of 28 positions shown; findings below may reference images not displayed]

Number of Fetuses:  1
 Heart Rate:  139
 Movement:  Yes
 Breathing:  No
 Presentation:  Breech
 Placental Location:  Fundal, posterior
 Grade:  I
 Previa:  No
 Amniotic Fluid (subjective):  Normal
 Amniotic Fluid (objective):  4.9 cm Vertical pocket 
 Fetal measurements were not requested on today’s exam.
 FETAL ANATOMY
 Lateral Ventricles:  Previously seen 
 Thalami/CSP:  Previously seen 
 Posterior Fossa:  Previously seen 
 Nuchal Region:  Previously seen 
 Spine:  Limited/previously limited
 4 Chamber Heart on Left:  Not visualized 
 Stomach on Left:  Visualized 
 3 Vessel Cord:  Previously seen 
 Cord Insertion Site:  Previously seen 
 Kidneys:  Visualized 
 Bladder:  Visualized 
 Extremities:  Previously seen 

 MATERNAL FINDINGS
 Cervix:  0 cm Transvaginally
IMPRESSION: Single living intrauterine fetus in breech presentation with subjectively normal amniotic fluid volume.  
 Assessment of fetal anatomy is limited by the maternal habitus.  
 Cervix is again noted to be open with membranes bulging into the vagina. 
 A preliminary copy of the report was returned with the patient to the clinic immediately after the study was completed.

## 2004-01-16 ENCOUNTER — Observation Stay (HOSPITAL_COMMUNITY): Admission: AD | Admit: 2004-01-16 | Discharge: 2004-01-16 | Payer: Self-pay | Admitting: *Deleted

## 2004-01-16 ENCOUNTER — Ambulatory Visit: Payer: Self-pay | Admitting: Family Medicine

## 2004-01-16 ENCOUNTER — Encounter (INDEPENDENT_AMBULATORY_CARE_PROVIDER_SITE_OTHER): Payer: Self-pay | Admitting: Specialist

## 2004-03-13 ENCOUNTER — Ambulatory Visit: Payer: Self-pay | Admitting: Obstetrics and Gynecology

## 2004-03-20 ENCOUNTER — Ambulatory Visit: Payer: Self-pay | Admitting: Obstetrics and Gynecology

## 2004-08-01 ENCOUNTER — Ambulatory Visit: Payer: Self-pay | Admitting: Family Medicine

## 2004-08-05 ENCOUNTER — Ambulatory Visit: Payer: Self-pay | Admitting: Family Medicine

## 2007-05-17 ENCOUNTER — Ambulatory Visit (HOSPITAL_COMMUNITY): Admission: RE | Admit: 2007-05-17 | Discharge: 2007-05-17 | Payer: Self-pay | Admitting: Obstetrics & Gynecology

## 2007-05-17 IMAGING — US US TRANSVAGINAL NON-OB
1 series · 18 of 25 positions shown · non-contrast
Comparison: none

CLINICAL DATA: Spotting; IUD placed four years ago.
TRANSVAGINAL PELVIC ULTRASOUND:
TECHNIQUE: Transvaginal ultrasound examination of the pelvis was performed including evaluation of the uterus, ovaries, adnexal regions, and pelvic cul-de-sac.

[Series 1: us transvaginal non-ob · 18 of 42 slices shown]
[im 1/42]
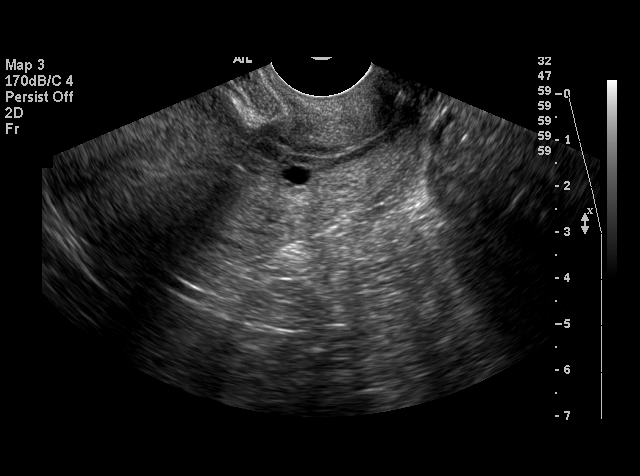
[im 4/42]
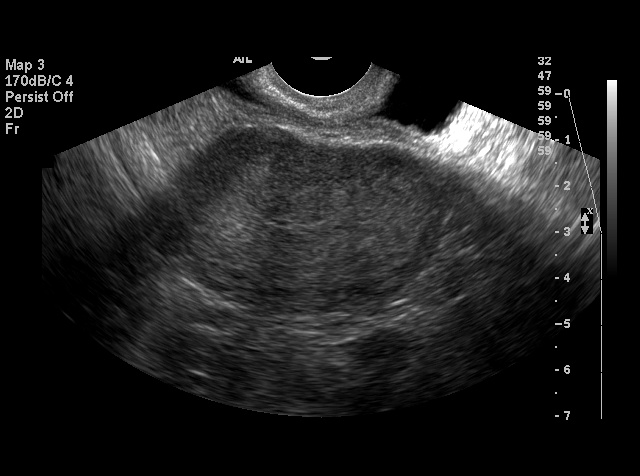
[im 6/42]
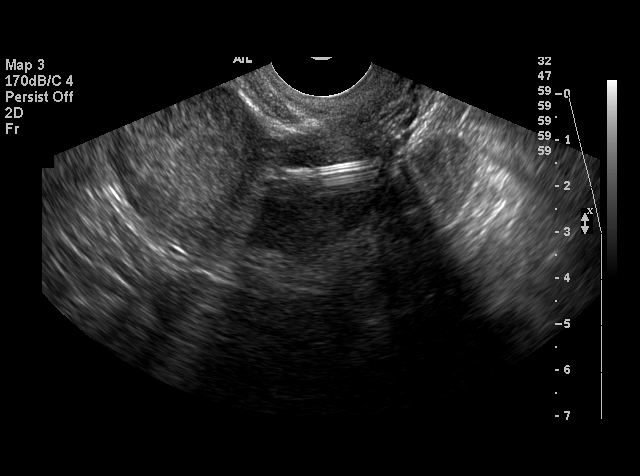
[im 7/42]
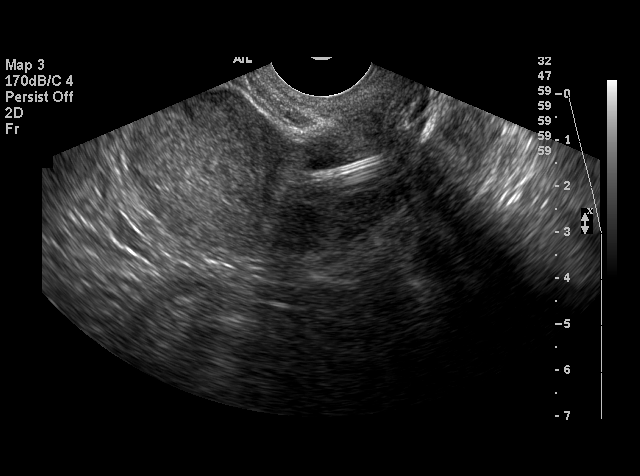
[im 11/42]
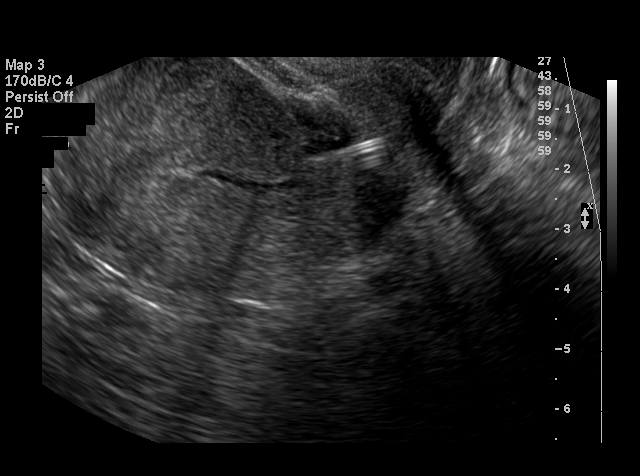
[im 12/42]
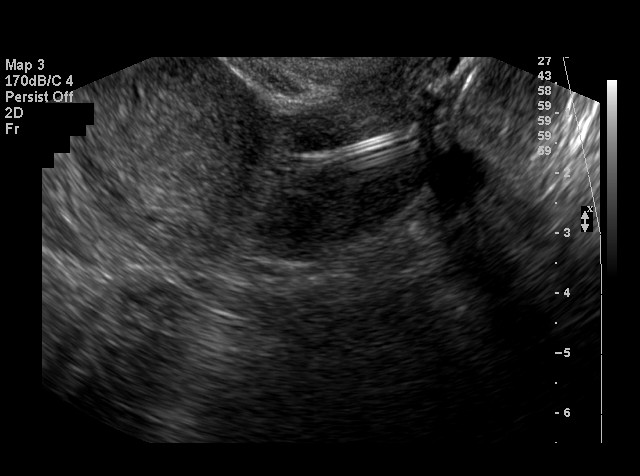
[im 16/42]
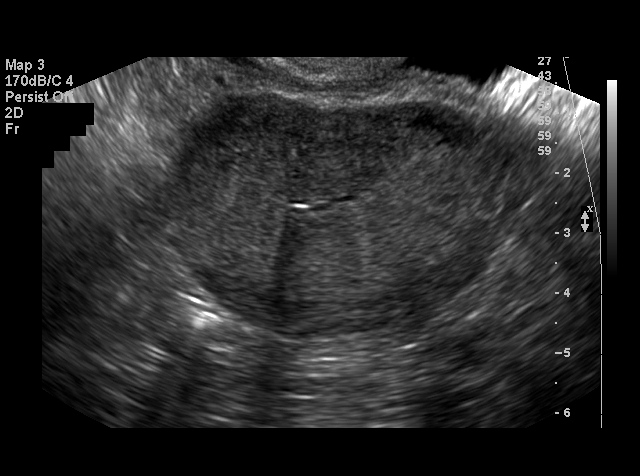
[im 18/42]
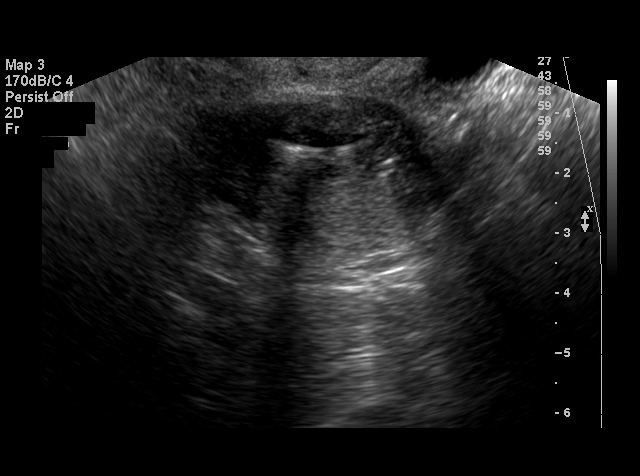
[im 19/42]
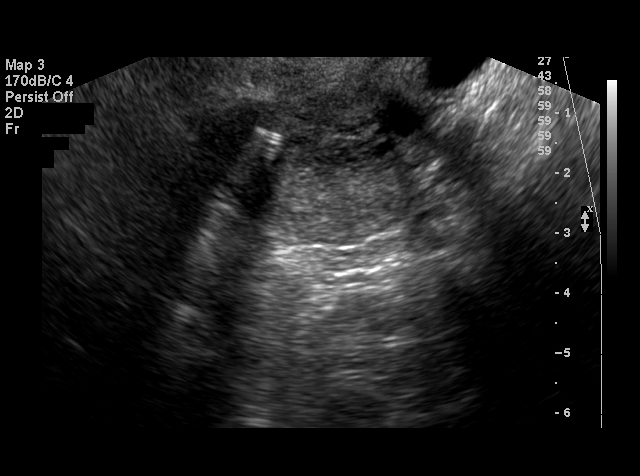
[im 23/42]
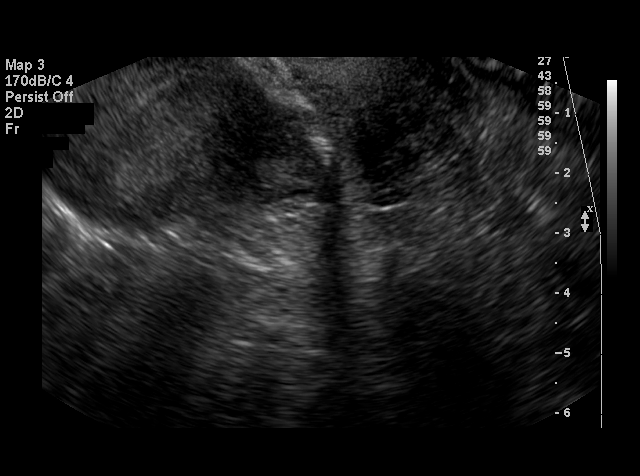
[im 24/42]
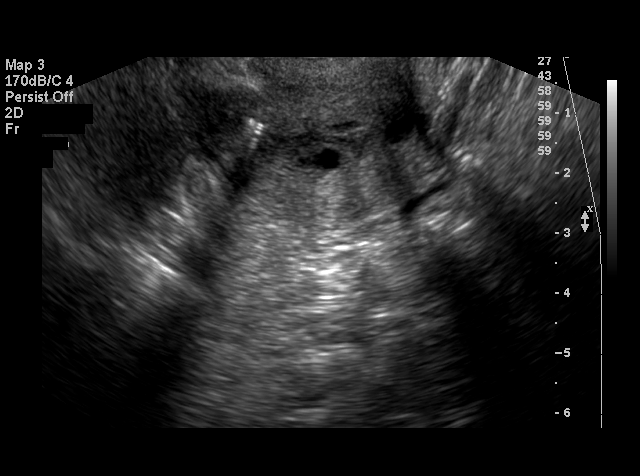
[im 26/42]
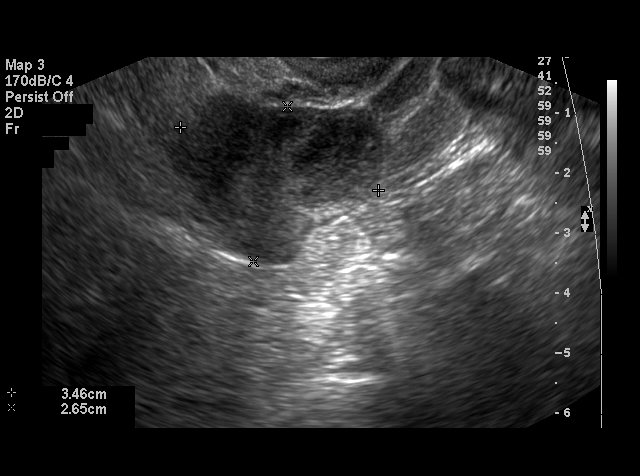
[im 30/42]
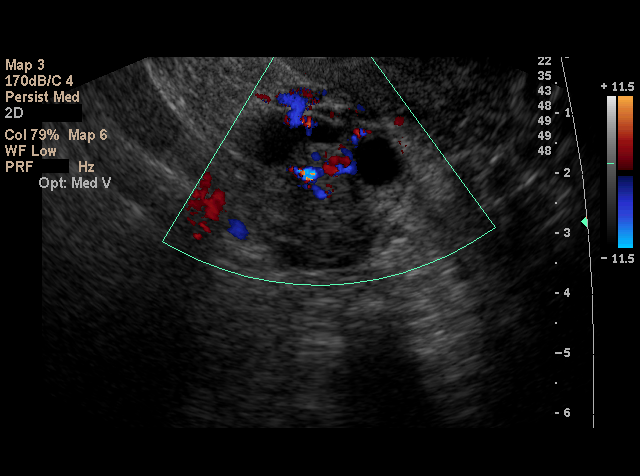
[im 31/42]
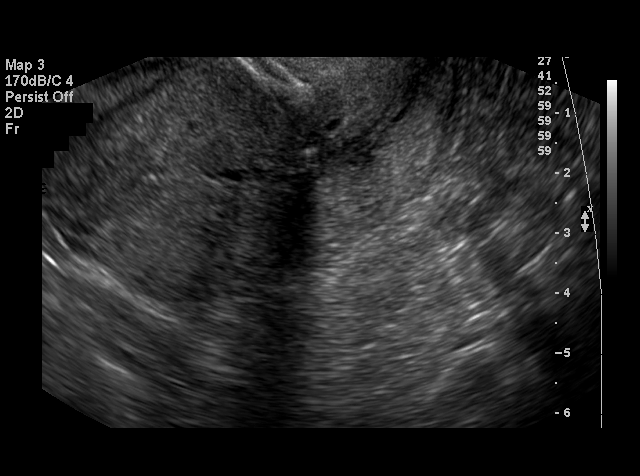
[im 35/42]
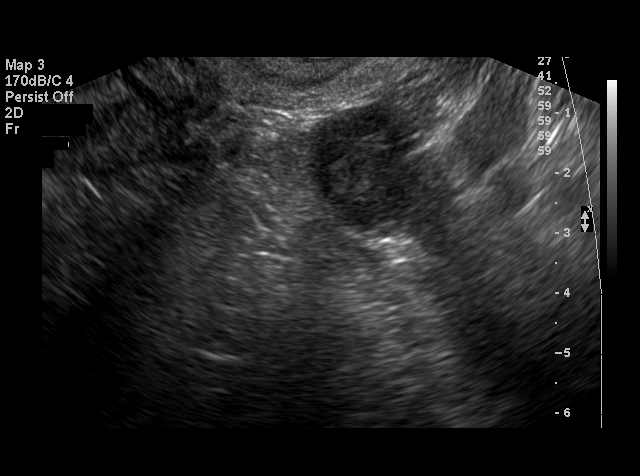
[im 36/42]
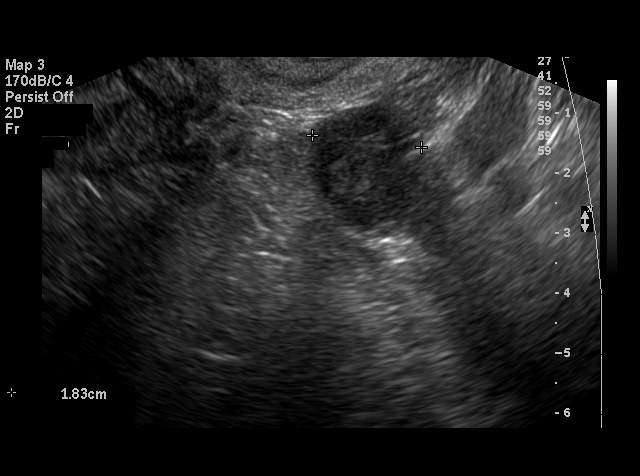
[im 38/42]
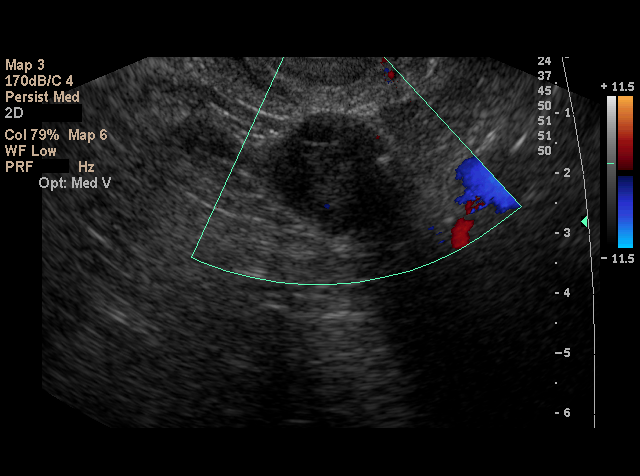
[im 42/42]
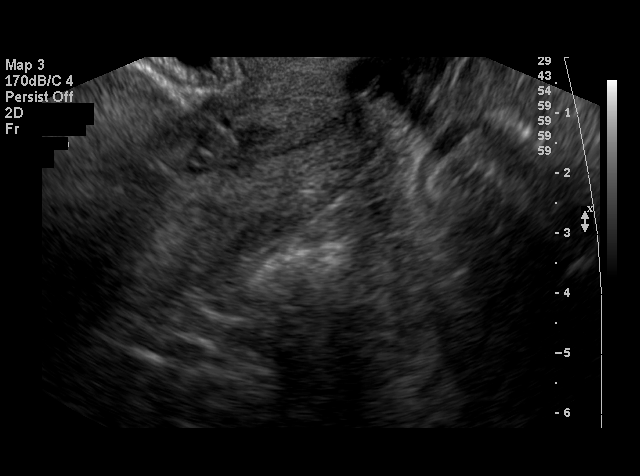

[18 of 25 positions shown; findings below may reference images not displayed]

FINDINGS: The uterus is normal in size and echotexture.  The uterine dimensions are 8.2 x 4.1 x 6.1 cm.  The endometrial stripe is thin and homogeneous measuring 2-3 mm in width.  The IUD is located at the lower uterine segment to the right of midline and is significantly indenting the endometrium or may possibly be within the myometrium of the right lower uterine segment.  The IUD does not appear to be perforated through the entire myometrium, however.
Both ovaries have a normal size and appearance.  The right ovary measures 3.5 x 2.7 x 2.8 cm, and the left ovary measures 2.6 x 1.8 x 1.8 cm.  No adnexal masses or free pelvic fluid are identified.
IMPRESSION: The IUD is at the lower uterine segment to the right of midline.  The inferior tip of the IUD is either indenting the endometrium or may possibly be within the myometrium but is not perforated through the subserosal myometrium.

## 2007-08-19 IMAGING — CT CT HEAD W/O CM
2 of 7 series · 7 of 40 positions shown, 8 images · non-contrast
Comparison: NONE

CLINICAL DATA: Persistent dizziness times 2 weeks.  Nausea. 

CT HEAD WITHOUT INTRAVENOUS CONTRAST
TECHNIQUE: Axial 5-mm-thick slices were obtained through the 
posterior fossa and 5-mm-thick slices were obtained through the 
remaining portion of the head without intravenous contrast.

[Series 4: — · axial · 0.16mm/px · z∈[-453,-417]mm · 4 of 176 slices shown, 5 images]
[im 22/176  brain]
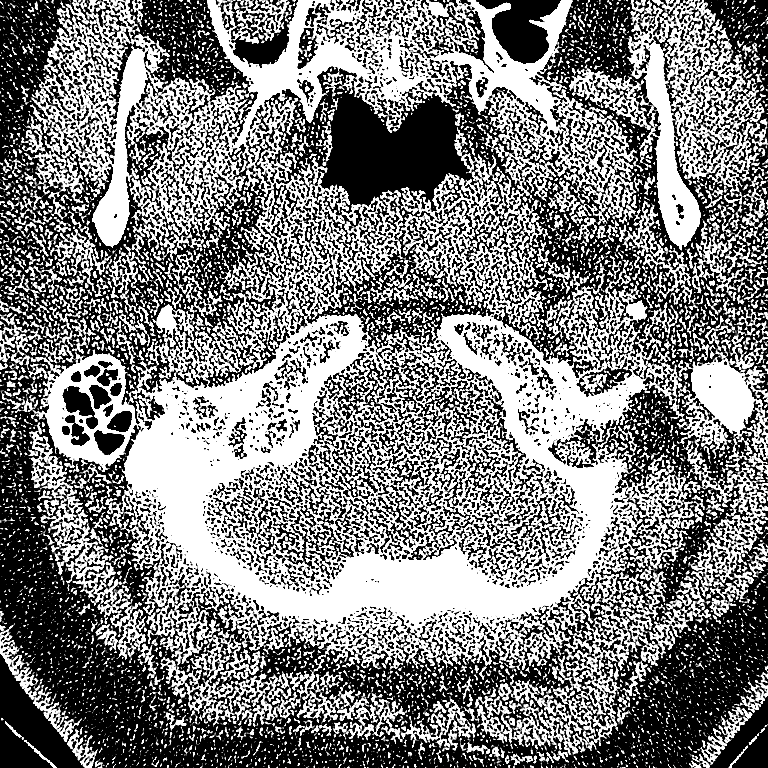
[im 22/176  bone]
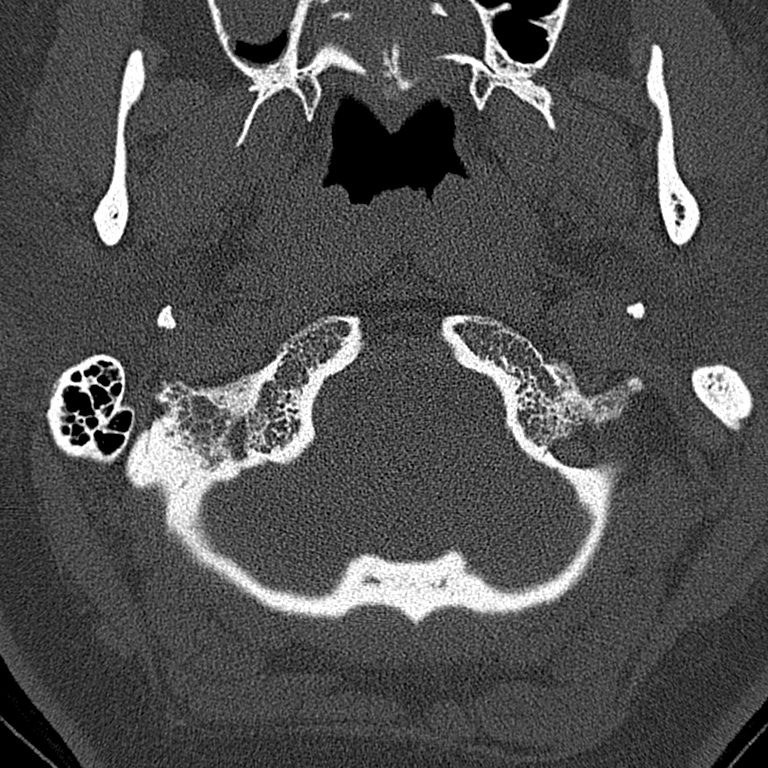
[im 66/176  brain]
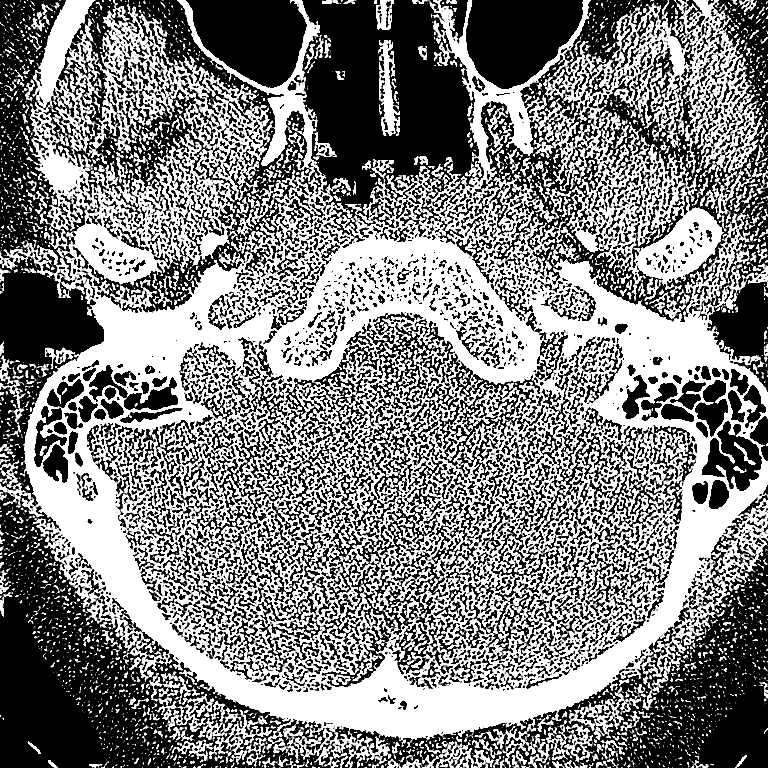
[im 110/176  brain]
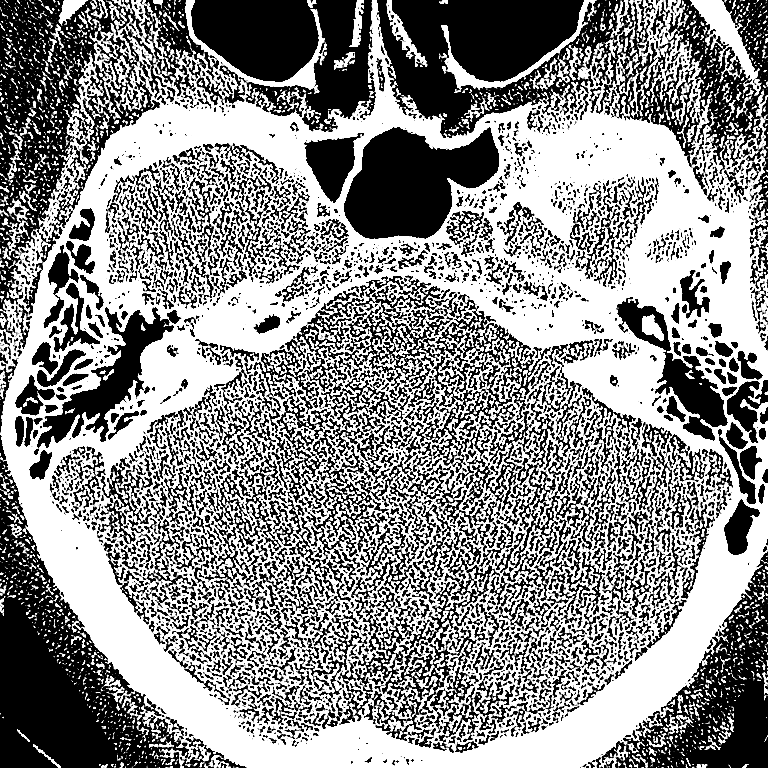
[im 154/176  brain]
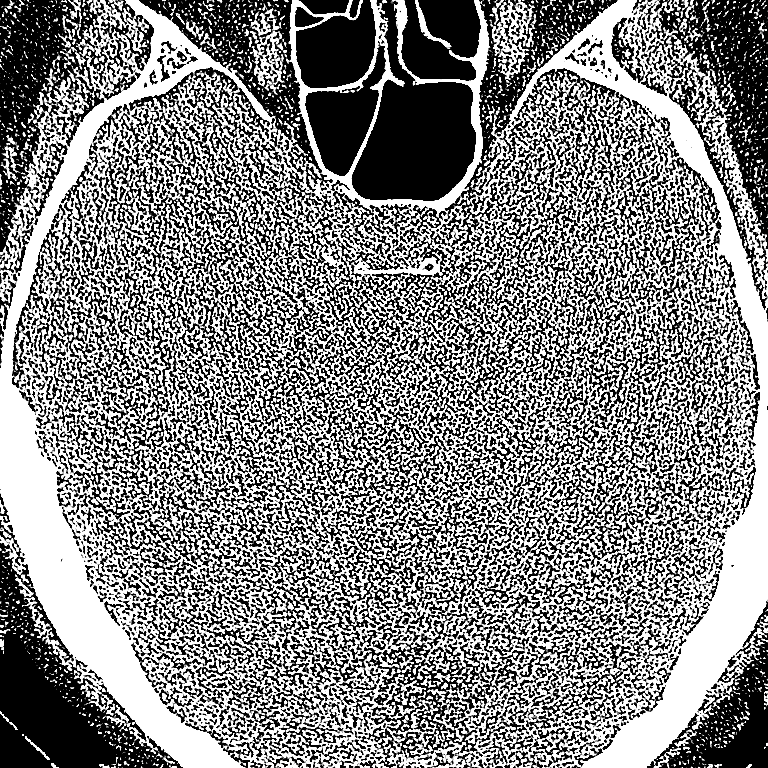

[right coronal · coronal · 0.13mm/px · 3 of 52 slices shown]
[im 11/52  brain]
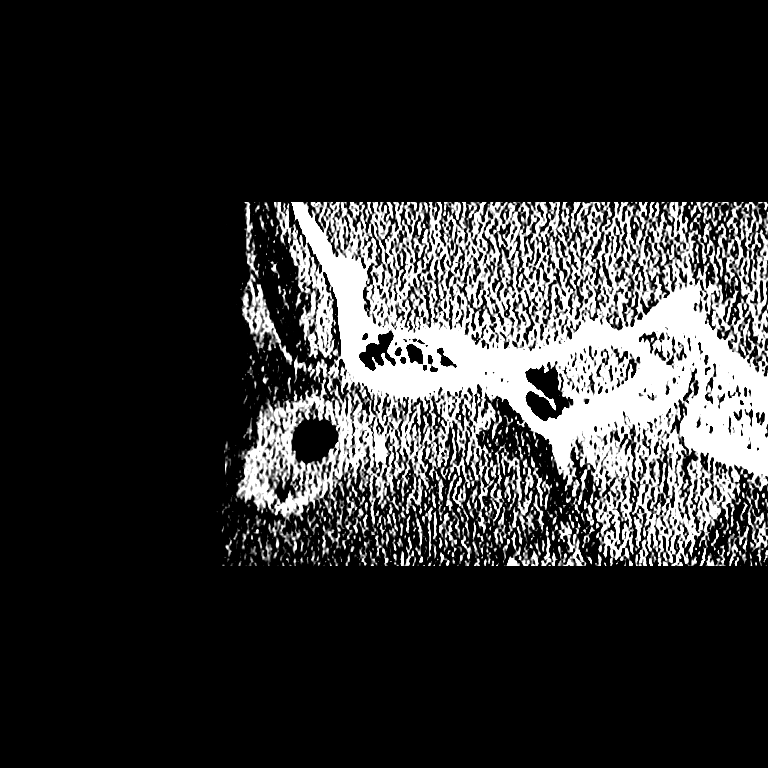
[im 21/52  brain]
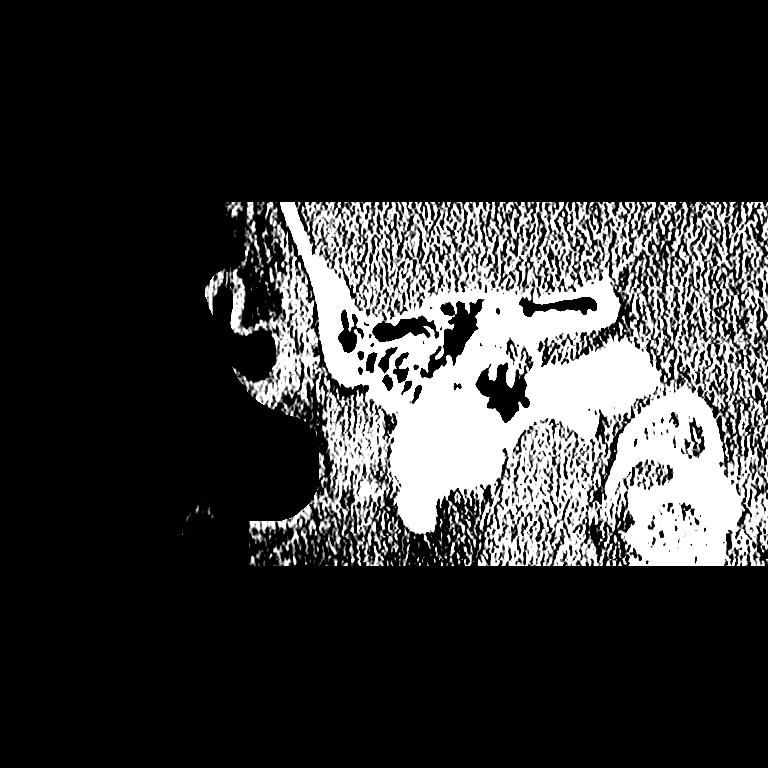
[im 31/52  brain]
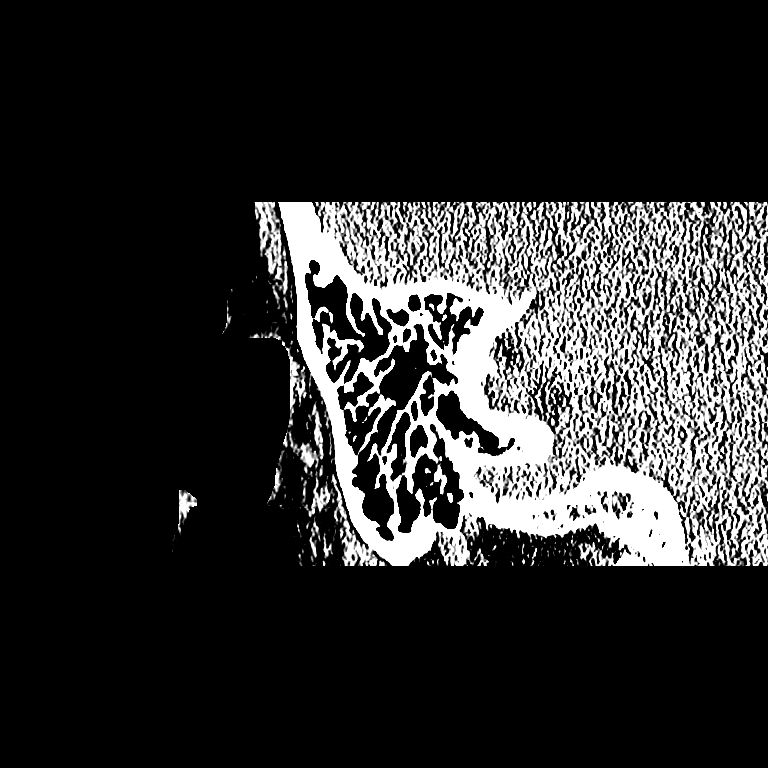

[7 of 40 positions shown; findings below may reference images not displayed]

FINDINGS: The visualized portions of the paranasal sinuses, 
orbits, and mastoids are unremarkable in appearance. The fourth, 
third and both lateral ventricles are identified.  There is no 
evidence of midline shift or mass effect infratentorially or 
supratentorially. No areas of abnormal radiolucency or 
radiodensity are identified. There is no evidence of subarachnoid 
or intracerebral hemorrhage.  There is no evidence of subdural or 
epidural hematoma. The calvarium is intact.
IMPRESSION: Normal unenhanced CT of the head. KLEVER Date:  [DATE] DAS  JLM

## 2010-03-31 ENCOUNTER — Emergency Department (HOSPITAL_COMMUNITY)
Admission: EM | Admit: 2010-03-31 | Discharge: 2010-03-31 | Payer: Self-pay | Source: Home / Self Care | Admitting: Emergency Medicine

## 2010-04-07 LAB — CBC
HCT: 47.8 % — ABNORMAL HIGH (ref 36.0–46.0)
Hemoglobin: 16.6 g/dL — ABNORMAL HIGH (ref 12.0–15.0)
MCH: 33.6 pg (ref 26.0–34.0)
MCHC: 34.7 g/dL (ref 30.0–36.0)
MCV: 96.8 fL (ref 78.0–100.0)
Platelets: 321 10*3/uL (ref 150–400)
RBC: 4.94 MIL/uL (ref 3.87–5.11)
RDW: 13.9 % (ref 11.5–15.5)
WBC: 14.7 10*3/uL — ABNORMAL HIGH (ref 4.0–10.5)

## 2010-04-07 LAB — URINALYSIS, ROUTINE W REFLEX MICROSCOPIC
Bilirubin Urine: NEGATIVE
Ketones, ur: NEGATIVE mg/dL
Nitrite: POSITIVE — AB
Protein, ur: 30 mg/dL — AB
Specific Gravity, Urine: 1.019 (ref 1.005–1.030)
Urine Glucose, Fasting: NEGATIVE mg/dL
Urobilinogen, UA: 0.2 mg/dL (ref 0.0–1.0)
pH: 6 (ref 5.0–8.0)

## 2010-04-07 LAB — POCT CARDIAC MARKERS
CKMB, poc: <1 ng/mL — ABNORMAL LOW (ref 1.0–8.0)
Myoglobin, poc: 59.6 ng/mL (ref 12–200)
Troponin i, poc: <0.05 ng/mL (ref 0.00–0.09)

## 2010-04-07 LAB — POCT I-STAT, CHEM 8
BUN: 16 mg/dL (ref 6–23)
Calcium, Ion: 1.13 mmol/L (ref 1.12–1.32)
Chloride: 105 mEq/L (ref 96–112)
Creatinine, Ser: 1 mg/dL (ref 0.4–1.2)
Glucose, Bld: 159 mg/dL — ABNORMAL HIGH (ref 70–99)
HCT: 50 % — ABNORMAL HIGH (ref 36.0–46.0)
Hemoglobin: 17 g/dL — ABNORMAL HIGH (ref 12.0–15.0)
Potassium: 3.2 mEq/L — ABNORMAL LOW (ref 3.5–5.1)
Sodium: 138 mEq/L (ref 135–145)
TCO2: 25 mmol/L (ref 0–100)

## 2010-04-07 LAB — DIFFERENTIAL
Basophils Absolute: 0 10*3/uL (ref 0.0–0.1)
Basophils Relative: 0 % (ref 0–1)
Eosinophils Absolute: 0.1 10*3/uL (ref 0.0–0.7)
Eosinophils Relative: 1 % (ref 0–5)
Lymphocytes Relative: 12 % (ref 12–46)
Lymphs Abs: 1.7 10*3/uL (ref 0.7–4.0)
Monocytes Absolute: 0.8 10*3/uL (ref 0.1–1.0)
Monocytes Relative: 5 % (ref 3–12)
Neutro Abs: 12.1 10*3/uL — ABNORMAL HIGH (ref 1.7–7.7)
Neutrophils Relative %: 82 % — ABNORMAL HIGH (ref 43–77)

## 2010-04-07 LAB — URINE MICROSCOPIC-ADD ON

## 2010-04-12 ENCOUNTER — Encounter: Payer: Self-pay | Admitting: *Deleted
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
## 2010-08-08 NOTE — Discharge Summary (Signed)
[facility]  Patient:    [name], Pamela Carney Visit Number: [id number] MRN: [id number]          Service Type: OBS Location: 910A 9108 01 Attending Physician:  Frederich Balding Dictated by:   Danie Chandler, R.N. Admit Date:  04/21/2001 Discharge Date: 04/24/2001                             Discharge Summary  ADMITTING DIAGNOSES:          1. Intrauterine pregnancy at [redacted] weeks gestation.                               2. Spontaneous rupture of membranes.                               3. Prior cesarean section x2, for repeat.                               4. History of positive group B beta strep.                               5. Insulin-dependent diabetes.  DISCHARGE DIAGNOSES:          1. Intrauterine pregnancy at [redacted] weeks gestation.                               2. Spontaneous rupture of membranes.                               3. Prior cesarean section x2, for repeat.                               4. History of positive group B strep.                               5. Insulin-dependent diabetes.  PROCEDURE:                    On April 21, 2001, repeat low transverse cesarean section.  REASON FOR ADMISSION:         Please see dictated H&P.  HOSPITAL COURSE:              The patient was taken to the operating room and underwent the above-named procedure without complication.  This was productive of a viable female infant with Apgars of 8 at one minute and 9 at 5 minutes and an arterial cord pH of 7.31.  Postoperatively, on day #1, the patient had good return of bowel function and was tolerating a regular diet.  Her hemoglobin was 9.9, hematocrit 28.9, and white blood cell count 12.4.  The patients blood sugars were within normal limits.  On postoperative day #2, she was ambulating well without difficulty and had good pain control.  She was discharged home on postoperative day #3.  CONDITION ON DISCHARGE:       Good.  DIET:  Regular as tolerated.  ACTIVITY:                     No heavy lifting, no driving, no vaginal entry. She is to follow up in the office in 1-2 weeks for incision check and she is to call for temperature greater than 100.0 degrees, persistent nausea or vomiting, heavy vaginal bleeding, and/or redness or drainage from the incision site.  DISCHARGE MEDICATIONS:        1. Prenatal vitamin 1 p.o. q.d.                               2. Darvocet-N 100 one to two every 6 hours                                  as needed for pain.                               3. Keflex 500 mg 1 four times a day for                                  7 days with no refill. Dictated by:   Danie Chandler, R.N. Attending Physician:  Frederich Balding DD:  05/10/01 TD:  05/10/01 Job: [id number]

## 2010-08-08 NOTE — Op Note (Signed)
NAME:  Pamela Carney, Pamela Carney              ACCOUNT NO.:  [id number]   MEDICAL RECORD NO.:  [id number]          PATIENT TYPE:  WOC   LOCATION:  WOC                          FACILITY:  [facility]   PHYSICIAN:  Phil D. Okey Dupre, M.D.     DATE OF BIRTH:  [birth year]   DATE OF PROCEDURE:  01/04/2004  DATE OF DISCHARGE:                                 OPERATIVE REPORT   PROCEDURE:  Cervical cerclage (werm procedure).   PREOPERATIVE DIAGNOSES:  Rescue cerclage, [redacted] weeks gestation.   POSTOPERATIVE DIAGNOSES:  Rescue cerclage, [redacted] weeks gestation.   ANESTHESIA:  Spinal.   SURGEON:  Phil D. Rose, M.D.   ESTIMATED BLOOD LOSS:  10 mL.   POSTOPERATIVE CONDITION:  Satisfactory.   FINDINGS:  It was extremely difficult to visualize the cervix in this  patient no matter what retractors we used.  The anterior lip of the cervix  was able to be grasped with ring forceps and the membranes were easily seen  up in the cervix for an expanse of about 2 cm. The posterior lip of the  cervix was very difficult to visualize and grab and we had to manipulate  enormously with retractors in order to be able to see what looked like the  posterior lip which also grasped with the ring forceps.  Because of the  difficulty in visualization, I could not clearly grasp the lateral areas of  the cervix in order to do either a McDonald or a Shirodkar so elected to do  a werm procedure.   DESCRIPTION OF PROCEDURE:  Under satisfactory spinal anesthesia, the patient  in the dorsal lithotomy position, perineum and vagina prepped and draped in  the usual sterile manner. Findings were as above. A weighted speculum was  placed in the posterior fourchette of the vagina and Deaver's were used on  the left and right side of the vagina in order to try and best visualize the  cervix which was very difficult. The anterior lip of the cervix was able to  be clearly seen when the anterior bladder was held up by a Deaver retractor  and grasped with a  ring forceps. Because of our difficulty in visualization  and manipulation because of the small amount of space to work in, I decided  to do a werm procedure in which we took a #1 double silk suture, came  through the lateral wall at 3 o'clock approximately 1 to 1 1/2 cm from the  distal end of the cervix and completely crossed and out at approximately 9  o'clock. The needle was reset and we went in at 9 o'clock and out at 3  o'clock. These were held along and the same type of procedure was done with  the same suture going from 6 o'clock to 12 just at the reflection of the  bladder, back in at 12 and out at 6.  The lateral sutures were tied first,  the posteriors were then tied.  Palpation revealed a  closed cervix, however, could not visually determine the length of the  cervix. There was no bleeding during the procedure. The  patient was  transferred to the recovery room in satisfactory condition having tolerated  the procedure well.      PDR/MEDQ  D:  01/04/2004  T:  01/04/2004  Job:  [id number]

## 2010-08-08 NOTE — H&P (Signed)
[facility]  Patient:    Pamela Carney, Pamela Carney Visit Number: [id number] MRN: [id number]          Service Type: OBS Location: 910B 9198 01 Attending Physician:  Frederich Balding Dictated by:   Juluis Mire, M.D. Admit Date:  04/21/2001                           History and Physical  HISTORY OF PRESENT ILLNESS:   The patient is a 45 year old gravida 6, para 2-1-2-2, married white female, whose estimated date of confinement is May 26, 2001, by ultrasounds.  This gives her an estimated gestational age of [redacted] weeks.  The patient is admitted at the present time with spontaneous rupture of membranes.  In relation to the present admission, the patient reported the onset of vaginal leakage today.  She presented to triage, where exam confirmed gross rupture of membranes.  She has had two prior cesarean sections and is for repeat cesarean section.  She has also had a history of positive group B Strep and previous chorioamnionitis.  Finally, her prenatal course was complicated by insulin dependent diabetes.  She is on 30 units of NPH each evening with fairly good control.  With the rupture of membranes because of our concern about infection at 35 weeks being greater than the risk of lung immaturity, we are going to proceed with delivery in the form of a repeat cesarean section at her request at this time.  She was desirous of tubal, but we are going to forego this at this point.  ALLERGIES:                    The patient has a LATEX sensitivity that causes a rash but no known drug allergies.  MEDICATIONS:                  1. Prenatal vitamins.                               2. NPH insulin 30 units each evening.  For past medical history, family history, and social history, please see prenatal records.  REVIEW OF SYSTEMS:            Noncontributory.  PHYSICAL EXAMINATION:  VITAL SIGNS:                  The patient is afebrile, stable vital  signs.  LUNGS:                        Clear.  CARDIAC:                      Regular rate, grade 2/6 systolic ejection murmur.  No clicks or gallops.  ABDOMEN:                      Exam confirms a gravid uterus.  PELVIC:                       Exam by speculum revealed gross rupture of membranes.  Cervix looks to be closed.  EXTREMITIES:                  Trace edema.  NEUROLOGIC:  Grossly within normal limits.  Deep tendon reflexes 2+, no clonus.  IMPRESSION:                   1. Intrauterine pregnancy at 35 weeks with                                  spontaneous rupture of membranes.                               2. Prior cesarean sections, desirous of repeat.                               3. History of positive group B Strep screen with                                  previous history of chorioamnionitis early                                  in pregnancy.                               4. Insulin dependent diabetes.  PLAN:                         After discussions and options we will proceed with the cesarean section at this point.  She will be given antibiotics preoperatively.  She does understand the potential risk of fetal pulmonary immaturity and the need to go to the neonatal intensive care unit for management.  We have explained to her that we believe that this risk is potentially less than the risk of subsequent infection from ruptured membranes, particularly in view of her history and group B Strep.  She does understand the risks of surgery, having discussed, including the risk of anesthesia, the risk of infection, the risk of hemorrhage could necessitate transfusion with the risk of _________ hepatitis, risk of injury to adjacent organs, including bladder, bowel, or ureters, could require further exploratory surgery, and the risk of being _________ and pulmonary embolus. The patient expressed understanding of indications and risks. Dictated by:   Juluis Mire, M.D. Attending Physician:  Frederich Balding DD:  04/21/01 TD:  04/21/01 Job: [id number]

## 2010-08-08 NOTE — Discharge Summary (Signed)
NAME:  Pamela Carney, Pamela Carney              ACCOUNT NO.:  [id number]   MEDICAL RECORD NO.:  [id number]          PATIENT TYPE:  INP   LOCATION:  9315                          FACILITY:  [facility]   PHYSICIAN:  Phil D. Okey Dupre, M.D.     DATE OF BIRTH:  [birth year]   DATE OF ADMISSION:  01/02/2004  DATE OF DISCHARGE:  01/06/2004                                 DISCHARGE SUMMARY   HISTORY OF PRESENT ILLNESS:  This is a 45 year old gravida 7 para 2-3-1-3  who was admitted on January 02, 2004 after routine ultrasound revealed a  shortened cervix that was indeed so short it was unable to be measured.  The  patient does have a history of an early loss without any contractions.  She  also has a history of several preterm deliveries.  She was admitted and  placed on antibiotics for several days, and on January 04, 2004 it was  planned to do a cerclage.   HOSPITAL COURSE:  The patient did undergo a cerclage by Elinor Dodge, M.D. on  January 04, 2004.  It was uncomplicated.  She stayed in the hospital on  bedrest until January 06, 2004.  She is having no complications and no pain,  there is no bleeding, and on postoperative day #2 she is eating and drinking  and ambulating to the bathroom without any difficulty.  She is discharged  home in good condition.   DISCHARGE INSTRUCTIONS:  1.  Rest about the house as much as possible.  2.  She is to continue her prenatal vitamins.  3.  She is also to be seen in the high risk clinic this week - she is to      call to make an appointment.      [place]/MEDQ  D:  01/06/2004  T:  01/07/2004  Job:  [id number]

## 2010-08-08 NOTE — Op Note (Signed)
[facility]  Patient:    Pamela Carney, Pamela Carney Visit Number: [id number] MRN: [id number]          Service Type: OBS Location: *N Attending Physician:  Soledad Gerlach Dictated by:   Juluis Mire, M.D. Proc. Date: 04/21/01                             Operative Report  PREOPERATIVE DIAGNOSES:       1. Intrauterine pregnancy at 35 weeks.                               2. Spontaneous rupture of membranes.                               3. Prior cesarean sections times two for repeat.                               4. History of positive group B beta                                  streptococcus.                               5. Insulin-dependent diabetes.  POSTOPERATIVE DIAGNOSES:      1. Intrauterine pregnancy at 35 weeks.                               2. Spontaneous rupture of membranes.                               3. Prior cesarean sections times two for repeat.                               4. History of positive group B beta                                  streptococcus.                               5. Insulin-dependent diabetes.  OPERATION:                    Low transverse cesarean section.  SURGEON:                      Juluis Mire, M.D.  ANESTHESIA:                   Epidural.  ESTIMATED BLOOD LOSS:         800 cc.  PACKS AND DRAINS:             None.  INTRAOPERATIVE BLOOD REPLACEMENT:                  None.  COMPLICATIONS:  None.  INDICATIONS:                  Are noted in the History and Physical.  DESCRIPTION OF PROCEDURE:     The patient was taken to the OR and placed in the supine position with a left lateral tilt.  After a satisfactory level of epidural anesthesia was obtained, the abdomen was prepped out with Betadine, and draped as a sterile field.  A low transverse skin incision was made with the knife and carried through the subcutaneous tissue.  The fascia was entered sharply __________ and the  fascia entered laterally.  The fascia was taken off the muscles superiorly and inferiorly.  The rectus muscles were separated in the midline.  The omentum was densely adherent to the upper part of the incision.  We were able to identify an area of preperitoneum.  We incised through that and extended it superiorly and inferiorly, not including the omentum.  A Balfour retractor was put in place.  A bladder flap was then developed.  A low transverse uterine incision was developed with the knife and extended laterally using ___________.  The infant was presenting in the vertex presentation.  Fluid was clear.  The infant was delivered with elevation and fundal pressure.  The infant was a viable female who weighed 4 pounds and 15 ounces.  Apgars were 8/9.  Umbilical cord pH was 7.31.  The placenta was then delivered manually.  At this point in time, we exteriorized the uterus.  The left tube and ovary was surgically absent.  The right tube and ovary were unremarkable.  The uterus was returned to the abdominal cavity.  At this point in time, the uterus was closed with a running locking suture of 0 chromic using two layer closure technique.  Hemostasis was excellent.  We thoroughly irrigated the pelvis.  We had good hemostasis.  Some of the omental adhesions were taken down.  However, these continued to extend superiorly.  We freed them up enough to make sure there was no loops, and that we were able to bring the muscles together.  At this point in time, the muscles were reapproximated in the midline with a running suture of 3-0 Vicryl.  The fascia was closed with a running suture of 0 PDS.  The subcutaneous was closed with a running suture of 3-0 Vicryl.  The skin was closed with staples.  Sponge, instrument, and needle count was correct by the circulating nurse x2.  Urine output was clear at the time of closure.  The patient did tolerate the procedure well and was returned to the  recovery room in excellent condition. Dictated by:   Juluis Mire, M.D. Attending Physician:  Soledad Gerlach DD:  04/21/01 TD:  04/22/01 Job: [id number]

## 2012-12-01 ENCOUNTER — Ambulatory Visit: Payer: Self-pay | Admitting: Obstetrics

## 2012-12-12 ENCOUNTER — Ambulatory Visit: Payer: Self-pay | Admitting: Obstetrics

## 2012-12-27 ENCOUNTER — Encounter: Payer: Self-pay | Admitting: Obstetrics

## 2012-12-27 ENCOUNTER — Ambulatory Visit (INDEPENDENT_AMBULATORY_CARE_PROVIDER_SITE_OTHER): Payer: 59

## 2012-12-27 ENCOUNTER — Other Ambulatory Visit: Payer: Self-pay | Admitting: Obstetrics

## 2012-12-27 ENCOUNTER — Ambulatory Visit (INDEPENDENT_AMBULATORY_CARE_PROVIDER_SITE_OTHER): Payer: 59 | Admitting: Obstetrics

## 2012-12-27 VITALS — BP 189/104 | HR 76 | Temp 97.1°F | Wt 252.0 lb

## 2012-12-27 DIAGNOSIS — Z30431 Encounter for routine checking of intrauterine contraceptive device: Secondary | ICD-10-CM

## 2012-12-27 DIAGNOSIS — R52 Pain, unspecified: Secondary | ICD-10-CM

## 2012-12-27 DIAGNOSIS — IMO0002 Reserved for concepts with insufficient information to code with codable children: Secondary | ICD-10-CM

## 2012-12-27 MED ORDER — IBUPROFEN 800 MG PO TABS: 800.0000 mg | ORAL_TABLET | Freq: Three times a day (TID) | ORAL | Status: DC | PRN

## 2012-12-27 NOTE — Progress Notes (Signed)
IUD Removal Procedure Note  Pre-operative Diagnosis: IUD Removal  Post-operative Diagnosis: Failed Attempted IUD Removal.  IUD String Not Assessable   Indications: 5 year expiration  Procedure Details  Urine pregnancy test was not done.  The risks (including infection, bleeding, pain, and uterine perforation) and benefits of the procedure were explained to the patient and Written informed consent was obtained.    Cervix cleansed with Betadine. String not visible.  Attempted to grasp string with IUD Hook and was not successful.    Condition: Stable  Complications: None  Plan:  Ultrasound was ordered and the patient will follow up with Dr. Tamela Oddi.

## 2012-12-28 ENCOUNTER — Encounter: Payer: Self-pay | Admitting: Obstetrics & Gynecology

## 2012-12-28 ENCOUNTER — Ambulatory Visit (INDEPENDENT_AMBULATORY_CARE_PROVIDER_SITE_OTHER): Payer: 59 | Admitting: Obstetrics & Gynecology

## 2012-12-28 VITALS — BP 152/99 | HR 88 | Temp 96.7°F | Wt 252.0 lb

## 2012-12-28 DIAGNOSIS — Z30430 Encounter for insertion of intrauterine contraceptive device: Secondary | ICD-10-CM

## 2012-12-28 DIAGNOSIS — IMO0001 Reserved for inherently not codable concepts without codable children: Secondary | ICD-10-CM | POA: Insufficient documentation

## 2012-12-28 DIAGNOSIS — N951 Menopausal and female climacteric states: Secondary | ICD-10-CM

## 2012-12-28 DIAGNOSIS — Z30432 Encounter for removal of intrauterine contraceptive device: Secondary | ICD-10-CM

## 2012-12-28 DIAGNOSIS — Z30433 Encounter for removal and reinsertion of intrauterine contraceptive device: Secondary | ICD-10-CM

## 2012-12-28 DIAGNOSIS — Z30431 Encounter for routine checking of intrauterine contraceptive device: Secondary | ICD-10-CM | POA: Insufficient documentation

## 2012-12-28 MED ORDER — ESTRADIOL 0.1 MG/24HR TD PTWK: 1.0000 | MEDICATED_PATCH | TRANSDERMAL | Status: DC

## 2012-12-28 MED ORDER — LEVONORGESTREL 20 MCG/24HR IU IUD
INTRAUTERINE_SYSTEM | Freq: Once | INTRAUTERINE | Status: AC
  Administered 2012-12-28: 17:00:00 via INTRAUTERINE

## 2012-12-28 NOTE — Progress Notes (Deleted)
Subjective:     Pamela Carney is a 47 y.o. female here for a routine exam.  Current complaints: problem visit for IUD. Pt was in office yesterday for an IUD removal. Pt was unable to have it removed. Pt had an ultrasound to check for placement.  Personal health questionnaire reviewed: yes.   Gynecologic History No LMP recorded. Patient is not currently having periods (Reason: IUD). Contraception: IUD  Obstetric History OB History  Gravida Para Term Preterm AB SAB TAB Ectopic Multiple Living  7 3  3 2 1  1  3     # Outcome Date GA Lbr Len/2nd Weight Sex Delivery Anes PTL Lv  7 PRE 01/16/04 [redacted]w[redacted]d    SVD   N     Comments: Still Birth  6 PRE 04/21/01 [redacted]w[redacted]d    LTCS   Y  5 PRE 03/2000 [redacted]w[redacted]d    SVD   N     Comments: Still Birth  4 GRA 12/25/97     LTCS   Y  3 ECT 1999        N     Comments: Salipingectomy  2 SAB 1997        N  1 GRA 07/12/89     LTCS   Y       The following portions of the patient's history were reviewed and updated as appropriate: allergies, current medications, past family history, past medical history, past social history, past surgical history and problem list.  Review of Systems {ros; complete:30496}    Objective:    {exam; complete:18323}    Assessment:    Healthy female exam.    Plan:    {plan:19193}

## 2012-12-28 NOTE — Progress Notes (Signed)
IUD Insertion Procedure Note  Pre-operative Diagnosis: Retained IUD  Post-operative Diagnosis: same  Indications: contraception  Procedure Details  Urine pregnancy test was not done.  The risks (including infection, bleeding, pain, and uterine perforation) and benefits of the procedure were explained to the patient and Written informed consent was obtained.    Cervix cleansed with Betadine. A paracervical block was performed with 1% lidocaine.  The cervix was dilated with Prat dilators.  The IUD was removed with an IUD hook intact.  Uterus sounded to 7 cm. IUD inserted without difficulty. String visible and trimmed. Patient tolerated procedure well.  IUD Information: Mirena.  Condition: Stable  Complications: None  Plan:  The patient was advised to call for any fever or for prolonged or severe pain or bleeding. She was advised to use OTC analgesics as needed for mild to moderate pain.

## 2012-12-29 ENCOUNTER — Encounter: Payer: Self-pay | Admitting: Obstetrics

## 2012-12-29 DIAGNOSIS — N951 Menopausal and female climacteric states: Secondary | ICD-10-CM | POA: Insufficient documentation

## 2012-12-29 NOTE — Patient Instructions (Signed)
Perimenopause Perimenopause is the time when your body begins to move into the menopause (no menstrual period for 12 straight months). It is a natural process. Perimenopause can begin 2 to 8 years before the menopause and usually lasts for one year after the menopause. During this time, your ovaries may or may not produce an egg. The ovaries vary in their production of estrogen and progesterone hormones each month. This can cause irregular menstrual periods, difficulty in getting pregnant, vaginal bleeding between periods and uncomfortable symptoms. CAUSES  Irregular production of the ovarian hormones, estrogen and progesterone, and not ovulating every month.  Other causes include:  Tumor of the pituitary gland in the brain.  Medical disease that affects the ovaries.  Radiation treatment.  Chemotherapy.  Unknown causes.  Heavy smoking and excessive alcohol intake can bring on perimenopause sooner. SYMPTOMS   Hot flashes.  Night sweats.  Irregular menstrual periods.  Decrease sex drive.  Vaginal dryness.  Headaches.  Mood swings.  Depression.  Memory problems.  Irritability.  Tiredness.  Weight gain.  Trouble getting pregnant.  The beginning of losing bone cells (osteoporosis).  The beginning of hardening of the arteries (atherosclerosis). DIAGNOSIS  Your caregiver will make a diagnosis by analyzing your age, menstrual history and your symptoms. They will do a physical exam noting any changes in your body, especially your female organs. Female hormone tests may or may not be helpful depending on the amount and when you produce the female hormones. However, other hormone tests may be helpful (ex. thyroid hormone) to rule out other problems. TREATMENT  The decision to treat during the perimenopause should be made by you and your caregiver depending on how the symptoms are affecting you and your life style. There are various treatments available such as:  Treating  individual symptoms with a specific medication for that symptom (ex. tranquilizer for depression).  Herbal medications that can help specific symptoms.  Counseling.  Group therapy.  No treatment. HOME CARE INSTRUCTIONS   Before seeing your caregiver, make a list of your menstrual periods (when the occur, how heavy they are, how long between periods and how long they last), your symptoms and when they started.  Take the medication as recommended by your caregiver.  Sleep and rest.  Exercise.  Eat a diet that contains calcium (good for your bones) and soy (acts like estrogen hormone).  Do not smoke.  Avoid alcoholic beverages.  Taking vitamin E may help in certain cases.  Take calcium and vitamin D supplements to help prevent bone loss.  Group therapy is sometimes helpful.  Acupuncture may help in some cases. SEEK MEDICAL CARE IF:   You have any of the above and want to know if it is perimenopause.  You want advice and treatment for any of your symptoms mentioned above.  You need a referral to a specialist (gynecologist, psychiatrist or psychologist). SEEK IMMEDIATE MEDICAL CARE IF:   You have vaginal bleeding.  Your period lasts longer than 8 days.  You periods are recurring sooner than 21 days.  You have bleeding after intercourse.  You have severe depression.  You have pain when you urinate.  You have severe headaches.  You develop vision problems. Document Released: 04/16/2004 Document Revised: 06/01/2011 Document Reviewed: 01/05/2008 [employer] Patient Information 2014 [employer].  

## 2013-01-10 ENCOUNTER — Ambulatory Visit: Payer: 59 | Admitting: Family Medicine

## 2013-08-20 ENCOUNTER — Emergency Department (HOSPITAL_BASED_OUTPATIENT_CLINIC_OR_DEPARTMENT_OTHER)
Admission: EM | Admit: 2013-08-20 | Discharge: 2013-08-20 | Disposition: A | Discharge status: Home / Self Care | Payer: 59 | Attending: Emergency Medicine | Admitting: Emergency Medicine

## 2013-08-20 ENCOUNTER — Encounter (HOSPITAL_BASED_OUTPATIENT_CLINIC_OR_DEPARTMENT_OTHER): Payer: Self-pay | Admitting: Emergency Medicine

## 2013-08-20 DIAGNOSIS — Z79899 Other long term (current) drug therapy: Secondary | ICD-10-CM | POA: Insufficient documentation

## 2013-08-20 DIAGNOSIS — B353 Tinea pedis: Secondary | ICD-10-CM | POA: Insufficient documentation

## 2013-08-20 DIAGNOSIS — L089 Local infection of the skin and subcutaneous tissue, unspecified: Secondary | ICD-10-CM

## 2013-08-20 DIAGNOSIS — F411 Generalized anxiety disorder: Secondary | ICD-10-CM | POA: Insufficient documentation

## 2013-08-20 DIAGNOSIS — I10 Essential (primary) hypertension: Secondary | ICD-10-CM | POA: Insufficient documentation

## 2013-08-20 DIAGNOSIS — F319 Bipolar disorder, unspecified: Secondary | ICD-10-CM | POA: Insufficient documentation

## 2013-08-20 DIAGNOSIS — F17200 Nicotine dependence, unspecified, uncomplicated: Secondary | ICD-10-CM | POA: Insufficient documentation

## 2013-08-20 HISTORY — DX: Bipolar disorder, unspecified: F31.9

## 2013-08-20 LAB — CBG MONITORING, ED: Glucose-Capillary: 105 mg/dL — ABNORMAL HIGH (ref 70–99)

## 2013-08-20 MED ORDER — FLUCONAZOLE 150 MG PO TABS: 150.0000 mg | ORAL_TABLET | Freq: Every day | ORAL | Status: DC

## 2013-08-20 MED ORDER — HYDROCODONE-ACETAMINOPHEN 5-325 MG PO TABS: 2.0000 | ORAL_TABLET | ORAL | Status: DC | PRN

## 2013-08-20 MED ORDER — CEPHALEXIN 500 MG PO CAPS: 500.0000 mg | ORAL_CAPSULE | Freq: Four times a day (QID) | ORAL | Status: DC

## 2013-08-20 MED ORDER — CLOTRIMAZOLE 1 % EX CREA: TOPICAL_CREAM | CUTANEOUS | Status: DC

## 2013-08-20 NOTE — ED Provider Notes (Signed)
Medical screening examination/treatment/procedure(s) were performed by non-physician practitioner and as supervising physician I was immediately available for consultation/collaboration.   EKG Interpretation None        [place], DO 08/20/13 1516

## 2013-08-20 NOTE — ED Notes (Signed)
Past week noticed that her lower legs were feeling numbness and tingling. States that she can't walk more than a few minutes without her legs becoming painful. Swollen, worse on left than the right

## 2013-08-20 NOTE — ED Provider Notes (Signed)
CSN: [id number]     Arrival date & time 08/20/13  1425 History   First MD Initiated Contact with Patient 08/20/13 1448     Chief Complaint  Patient presents with  . Leg Swelling     (Consider location/radiation/quality/duration/timing/severity/associated sxs/prior Treatment) Patient is a 48 y.o. female presenting with leg pain. The history is provided by the patient. No language interpreter was used.  Leg Pain Location:  Leg Time since incident:  1 week Injury: no   Leg location:  L lower leg and R lower leg Pain details:    Quality:  Aching   Radiates to:  Does not radiate   Severity:  Moderate Dislocation: no   Prior injury to area:  No Relieved by:  Nothing Worsened by:  Nothing tried Ineffective treatments:  None tried Associated symptoms: itching   Risk factors: no recent illness   Pt complains of redness to foot, rash, swelling and tingling to both feet.    Past Medical History  Diagnosis Date  . Hypertension   . Abnormal Pap smear   . Genital warts     Hx of genital  . Anxiety   . Bipolar 1 disorder    Past Surgical History  Procedure Laterality Date  . Cesarean section    . Cervical cone biopsy    . Ovarian cyst removal    . Dilation and curettage of uterus    . Unilateral salpingectomy      Pt unsure of which fallopian tube was removed   Family History  Problem Relation Age of Onset  . Diabetes Paternal Grandfather   . COPD Paternal Grandfather   . Heart disease Paternal Grandfather   . Cancer Paternal Grandmother   . Heart disease Paternal Grandmother    History  Substance Use Topics  . Smoking status: Current Every Day Smoker -- 1.00 packs/day for 32 years  . Smokeless tobacco: Never Used  . Alcohol Use: No   OB History   Grav Para Term Preterm Abortions TAB SAB Ect Mult Living   7 3  3 2  1 1  3      Review of Systems  Musculoskeletal: Positive for joint swelling.  Skin: Positive for itching, rash and wound.  All other systems reviewed  and are negative.     Allergies  Sulfa antibiotics  Home Medications   Prior to Admission medications   Medication Sig Start Date End Date Taking? Authorizing Provider  divalproex (DEPAKOTE ER) 500 MG 24 hr tablet Take 2,000 mg by mouth daily.   Yes Historical Provider, MD  lamoTRIgine (LAMICTAL) 100 MG tablet Take 100 mg by mouth daily.   Yes Historical Provider, MD  ALPRAZolam Duanne Moron) 0.5 MG tablet Take 0.5 mg by mouth 4 (four) times daily.    Historical Provider, MD  bisoprolol-hydrochlorothiazide [facility]) 10-6.25 MG per tablet Take 1 tablet by mouth daily.    Historical Provider, MD  butalbital-acetaminophen-caffeine (FIORICET WITH CODEINE) 50-325-40-30 MG per capsule Take 2 capsules by mouth 3 (three) times daily.    Historical Provider, MD  estradiol (CLIMARA) 0.1 mg/24hr patch Place 1 patch (0.1 mg total) onto the skin once a week. 12/28/12   Lahoma Crocker, MD  ibuprofen (ADVIL,MOTRIN) 800 MG tablet Take 1 tablet (800 mg total) by mouth every 8 (eight) hours as needed for pain. 12/27/12   Shelly Bombard, MD  losartan (COZAAR) 25 MG tablet Take 25 mg by mouth daily.    Historical Provider, MD  PARoxetine (PAXIL) 10 MG tablet Take  5 mg by mouth every morning.    Historical Provider, MD   Pulse 78  Temp(Src) 97.5 F (36.4 C) (Oral)  Resp 18  Ht 5\' 2"  (1.575 m)  Wt 250 lb (113.399 kg)  BMI 45.71 kg/m2  SpO2 97% Physical Exam  Vitals reviewed. Constitutional: She is oriented to person, place, and time. She appears well-developed and well-nourished.  Musculoskeletal: She exhibits edema.  Neurological: She is alert and oriented to person, place, and time. She has normal reflexes.  Skin: Skin is warm. Rash noted.  Fungal infection between toes, erythema up legs,    Psychiatric: She has a normal mood and affect.    ED Course  Procedures (including critical care time) Labs Review Labs Reviewed  CBG MONITORING, ED - Abnormal; Notable for the following:    Glucose-Capillary  105 (*)    All other components within normal limits    Imaging Review No results found.   EKG Interpretation None      MDM   Final diagnoses:  Fungal infection of foot  Skin infection    Pt appears to have a fungal infection with secondary bacterial infection.  Fungal infection also to toes.   I will treat with keflex and lotrimin.   Pt referred to her primary care and to Dr. Paulla Dolly for evaluation/    Fransico Meadow, PA-C 08/20/13 1511

## 2013-08-20 NOTE — Discharge Instructions (Signed)
Athlete's Foot  Athlete's foot is a skin infection caused by a fungus. Athlete's foot is often seen between or under the toes. It can also be seen on the bottom of the foot. Athlete's foot can spread to other people by sharing towels or shower stalls. HOME CARE  Only take medicines as told by your doctor. Do not use steroid creams.  Wash your feet daily. Dry your feet well, especially between the toes.  Change your socks every day. Wear cotton or wool socks.  Change your socks 2 to 3 times a day in hot weather.  Wear sandals or canvas tennis shoes with good airflow.  If you have blisters, soak your feet in a solution as told by your doctor. Do this for 20 to 30 minutes, 2 times a day. Dry your feet well after you soak them.  Do not share towels.  Wear sandals when you use shared locker rooms or showers. GET HELP RIGHT AWAY IF:   You have a fever.  Your foot is puffy (swollen), sore, warm, or red.  You are not getting better after 7 days of treatment.  You still have athlete's foot after 30 days.  You have problems caused by your medicine. MAKE SURE YOU:   Understand these instructions.  Will watch your condition.  Will get help right away if you are not doing well or get worse. Document Released: 08/26/2007 Document Revised: 06/01/2011 Document Reviewed: 12/26/2010 [facility] Patient Information 2014 [place]. Cellulitis Cellulitis is an infection of the skin and the tissue beneath it. The infected area is usually red and tender. Cellulitis occurs most often in the arms and lower legs.  CAUSES  Cellulitis is caused by bacteria that enter the skin through cracks or cuts in the skin. The most common types of bacteria that cause cellulitis are Staphylococcus and Streptococcus. SYMPTOMS   Redness and warmth.  Swelling.  Tenderness or pain.  Fever. DIAGNOSIS  Your caregiver can usually determine what is wrong based on a physical exam. Blood tests may also be  done. TREATMENT  Treatment usually involves taking an antibiotic medicine. HOME CARE INSTRUCTIONS   Take your antibiotics as directed. Finish them even if you start to feel better.  Keep the infected arm or leg elevated to reduce swelling.  Apply a warm cloth to the affected area up to 4 times per day to relieve pain.  Only take over-the-counter or prescription medicines for pain, discomfort, or fever as directed by your caregiver.  Keep all follow-up appointments as directed by your caregiver. SEEK MEDICAL CARE IF:   You notice red streaks coming from the infected area.  Your red area gets larger or turns dark in color.  Your bone or joint underneath the infected area becomes painful after the skin has healed.  Your infection returns in the same area or another area.  You notice a swollen bump in the infected area.  You develop new symptoms. SEEK IMMEDIATE MEDICAL CARE IF:   You have a fever.  You feel very sleepy.  You develop vomiting or diarrhea.  You have a general ill feeling (malaise) with muscle aches and pains. MAKE SURE YOU:   Understand these instructions.  Will watch your condition.  Will get help right away if you are not doing well or get worse. Document Released: 12/17/2004 Document Revised: 09/08/2011 Document Reviewed: 05/25/2011 [facility] Patient Information 2014 [place].

## 2013-10-05 ENCOUNTER — Encounter (INDEPENDENT_AMBULATORY_CARE_PROVIDER_SITE_OTHER): Payer: Self-pay

## 2013-10-05 ENCOUNTER — Ambulatory Visit (INDEPENDENT_AMBULATORY_CARE_PROVIDER_SITE_OTHER): Payer: 59 | Admitting: Neurology

## 2013-10-05 ENCOUNTER — Encounter: Payer: Self-pay | Admitting: Neurology

## 2013-10-05 VITALS — BP 126/74 | HR 71 | Temp 97.3°F | Ht 62.0 in | Wt 258.0 lb

## 2013-10-05 DIAGNOSIS — G2581 Restless legs syndrome: Secondary | ICD-10-CM

## 2013-10-05 DIAGNOSIS — R0683 Snoring: Secondary | ICD-10-CM

## 2013-10-05 DIAGNOSIS — G4761 Periodic limb movement disorder: Secondary | ICD-10-CM

## 2013-10-05 DIAGNOSIS — G4440 Drug-induced headache, not elsewhere classified, not intractable: Secondary | ICD-10-CM

## 2013-10-05 DIAGNOSIS — G44209 Tension-type headache, unspecified, not intractable: Secondary | ICD-10-CM

## 2013-10-05 DIAGNOSIS — F1920 Other psychoactive substance dependence, uncomplicated: Secondary | ICD-10-CM

## 2013-10-05 DIAGNOSIS — R0609 Other forms of dyspnea: Secondary | ICD-10-CM

## 2013-10-05 DIAGNOSIS — R400 Somnolence: Secondary | ICD-10-CM

## 2013-10-05 DIAGNOSIS — G4710 Hypersomnia, unspecified: Secondary | ICD-10-CM

## 2013-10-05 DIAGNOSIS — R0989 Other specified symptoms and signs involving the circulatory and respiratory systems: Secondary | ICD-10-CM

## 2013-10-05 DIAGNOSIS — G43109 Migraine with aura, not intractable, without status migrainosus: Secondary | ICD-10-CM

## 2013-10-05 NOTE — Progress Notes (Signed)
Subjective:    Patient ID: Pamela Carney is a 48 y.o. female.  HPI    Star Age, MD, PhD [facility] [address], [address] [place], [place] [address]  Dear Dr. Marin Comment,   I saw your patient, Pamela Carney, upon your kind request in my neurologic clinic today for initial consultation of her headaches. The patient is unaccompanied today. As you know, Pamela Carney is a 48 year old right-handed woman with an underlying medical history bipolar, anxiety, hypertension, and obesity, who reports recurrent headaches for the past many years. She recently saw you on 08/25/2013 and eye examination showed mild cataracts in both eyes and visual acuity of 20/25 bilaterally with correction. She has had recurrent HAs since age 74. She has EEG. She seen Dr. Candis Schatz for her Bipolar and per Dr. Rex Kras has been taking Xanax 0.5 mg 4 times. She has been taking fioricet w codeine 2 pills 2-3 times a day. She reports a visual aura at times. She has a stabbing, burning and throbbing HA, she also has a pressure like sensation in the head, and often a tightness in her neck. She has been on Depakote ER 1500 mg qHS alternating with 2000 mg qHS.  She takes Motrin 200 mg 12 to 16 pills. She has never had a brain MRI or CTH. Of note, she reports snoring, morning HAs, EDS and her ESS is 20/24. She has never had a sleep study. She has never fallen asleep while driving. She wakes up from her own snoring. Her husband snores loudly. She has nocturia once at least. She takes vicodin 1 pill 2-3 times a day. She has leg swelling and went to the ER on 5/31 for L leg swelling and was diagnosed with a fungal infection of her leg. She has LBP and her L knee pops. She has been overweight for years.  The patient denies prior TIA or stroke symptoms, such as sudden onset of one sided weakness, numbness, tingling, slurring of speech or droopy face, hearing loss, tinnitus, diplopia or visual field cut or  monocular loss of vision.  She drinks 3-4 cans of soda per day. She smokes, she does not drink alcohol.  She is a restless sleeper, endorses some RLS symptoms and cannot lay still and twitches in her sleep and moves her legs.  Her mother died at 72. She had DM, COPD, cancer, heart d/s, kidney failure, and OSA (had CPAP).  Her MGM had addition to pain pills and drank alcohol excessively, and died of a stroke at 31.  Her father died at 40 from cancer.    Her Past Medical History Is Significant For: Past Medical History  Diagnosis Date  . Hypertension   . Abnormal Pap smear   . Genital warts     Hx of genital  . Anxiety   . Bipolar 1 disorder     Her Past Surgical History Is Significant For: Past Surgical History  Procedure Laterality Date  . Cesarean section    . Cervical cone biopsy    . Ovarian cyst removal    . Dilation and curettage of uterus    . Unilateral salpingectomy      Pt unsure of which fallopian tube was removed    Her Family History Is Significant For: Family History  Problem Relation Age of Onset  . Diabetes Paternal Grandfather   . COPD Paternal Grandfather   . Heart disease Paternal Grandfather   . Cancer Paternal Grandmother   . Heart  disease Paternal Grandmother   . Cancer Father   . Heart failure Mother     Her Social History Is Significant For: History   Social History  . Marital Status: Married    Spouse Name: N/A    Number of Children: N/A  . Years of Education: N/A   Social History Main Topics  . Smoking status: Current Every Day Smoker -- 1.00 packs/day for 32 years  . Smokeless tobacco: Never Used  . Alcohol Use: No  . Drug Use: No  . Sexual Activity: Yes    Birth Control/ Protection: IUD   Other Topics Concern  . None   Social History Narrative  . None    Her Allergies Are:  Allergies  Allergen Reactions  . Sulfa Antibiotics Hives and Swelling  :   Her Current Medications Are:  Outpatient Encounter Prescriptions as of  10/05/2013  Medication Sig  . ALPRAZolam (XANAX) 0.5 MG tablet Take 0.5 mg by mouth 4 (four) times daily.  . bisoprolol-hydrochlorothiazide (ZIAC) 10-6.25 MG per tablet Take 1 tablet by mouth daily.  . butalbital-acetaminophen-caffeine (FIORICET WITH CODEINE) 50-325-40-30 MG per capsule Take 2 capsules by mouth 3 (three) times daily.  . divalproex (DEPAKOTE ER) 500 MG 24 hr tablet Take 2,000 mg by mouth daily.  . fluconazole (DIFLUCAN) 150 MG tablet Take 1 tablet (150 mg total) by mouth daily.  Marland Kitchen HYDROcodone-acetaminophen (NORCO/VICODIN) 5-325 MG per tablet Take 2 tablets by mouth every 4 (four) hours as needed.  Marland Kitchen ibuprofen (ADVIL,MOTRIN) 800 MG tablet Take 1 tablet (800 mg total) by mouth every 8 (eight) hours as needed for pain.  Marland Kitchen lamoTRIgine (LAMICTAL) 100 MG tablet Take 100 mg by mouth daily.  Marland Kitchen losartan (COZAAR) 25 MG tablet Take 25 mg by mouth daily.  . [DISCONTINUED] cephALEXin (KEFLEX) 500 MG capsule Take 1 capsule (500 mg total) by mouth 4 (four) times daily.  . [DISCONTINUED] clotrimazole (LOTRIMIN) 1 % cream Apply to affected area 2 times daily  . [DISCONTINUED] estradiol (CLIMARA) 0.1 mg/24hr patch Place 1 patch (0.1 mg total) onto the skin once a week.  . [DISCONTINUED] PARoxetine (PAXIL) 10 MG tablet Take 5 mg by mouth every morning.  :  Review of Systems:  Out of a complete 14 point review of systems, all are reviewed and negative with the exception of these symptoms as listed below:  Review of Systems  Constitutional: Positive for activity change (disinterest) and fatigue.  HENT: Positive for tinnitus.   Eyes: Negative.   Respiratory: Positive for shortness of breath.   Cardiovascular: Positive for palpitations and leg swelling.  Gastrointestinal: Positive for diarrhea.  Endocrine: Positive for cold intolerance, heat intolerance and polydipsia.       Flushing  Genitourinary: Positive for difficulty urinating.  Musculoskeletal: Positive for arthralgias and myalgias.        Muscle cramps  Skin: Positive for rash (feet).  Allergic/Immunologic: Positive for immunocompromised state (frequent infections).  Neurological: Positive for dizziness, tremors, weakness, numbness and headaches.  Hematological: Bruises/bleeds easily.  Psychiatric/Behavioral: Positive for sleep disturbance (insomnia, snoring, eds) and dysphoric mood. The patient is nervous/anxious.     Objective:  Neurologic Exam  Physical Exam Physical Examination:   Filed Vitals:   10/05/13 1411  BP: 126/74  Pulse: 71  Temp: 97.3 F (36.3 C)    General Examination: The patient is a very pleasant 48 y.o. female in no acute distress. She is morbidly obese.    HEENT: Normocephalic, atraumatic, pupils are equal, round and reactive to light and  accommodation. Funduscopic exam is normal with sharp disc margins noted. Extraocular tracking is good without limitation to gaze excursion or nystagmus noted. Normal smooth pursuit is noted. Hearing is grossly intact. Tympanic membranes are clear bilaterally. Face is symmetric with normal facial animation and normal facial sensation. Speech is clear with no dysarthria noted. There is no hypophonia. There is no lip, neck/head, jaw or voice tremor. Neck is supple with full range of passive and active motion. There are no carotid bruits on auscultation. Oropharynx exam reveals: moderate mouth dryness, no teeth in the upper jaw and adequate dental hygiene in the lower jaw. There is marked airway crowding, due to narrow airway, swollen uvula and tonsils of 2+, R>L. Mallampati is class II. Tongue protrudes centrally and palate elevates symmetrically. Neck size is 17.25 inches.  Chest: Clear to auscultation without wheezing, rhonchi or crackles noted.  Heart: S1+S2+0, regular and normal without murmurs, rubs or gallops noted.   Abdomen: Soft, non-tender and non-distended with normal bowel sounds appreciated on auscultation.  Extremities: There is 2+ pitting edema in the  left distal lower extremity and trace in the R ankle. Pedal pulses are intact.  Skin: Warm and dry without trophic changes noted. There are no varicose veins. She has a patchy rash in the distal L leg  Musculoskeletal: exam reveals no obvious joint deformities, tenderness or joint swelling or erythema.   Neurologically:  Mental status: The patient is awake, alert and oriented in all 4 spheres. Her immediate and remote memory, attention, language skills and fund of knowledge are appropriate. There is no evidence of aphasia, agnosia, apraxia or anomia. Speech is clear with normal prosody and enunciation. Thought process is linear. Mood is normal and affect is normal.  Cranial nerves II - XII are as described above under HEENT exam. In addition: shoulder shrug is normal with equal shoulder height noted. Motor exam: Normal bulk, strength and tone is noted. There is no drift, tremor or rebound. Romberg is negative. Reflexes are trace throughout. Babinski: Toes are flexor bilaterally. Fine motor skills and coordination: intact with normal finger taps, normal hand movements, normal rapid alternating patting, normal foot taps and normal foot agility.  Cerebellar testing: No dysmetria or intention tremor on finger to nose testing. Heel to shin is unremarkable bilaterally. There is no truncal or gait ataxia.  Sensory exam: intact to light touch, pinprick, vibration, temperature sense in the upper and lower extremities with mild decrease to PP in the L dorsum of foot and R lateral distal leg.  Gait, station and balance: She stands easily. No veering to one side is noted. No leaning to one side is noted. Posture is age-appropriate and stance is narrow based. Gait shows normal stride length and normal pace. No problems turning are noted. She turns en bloc. Tandem walk is unremarkable. Intact toe and heel stance is noted.               Assessment and Plan:   In summary, Pamela Carney is a very pleasant 48  y.o.-year old female underlying medical history bipolar, anxiety, hypertension, and obesity, who reports recurrent headaches for the past many years. She likely has a combination of migrainous HAs, tension HAs and medication overuse HAs. I am particularly concerned about the amount of ibuprofen she is taking and her daily Fiorecet with Codeine use and daily vicodin use. She sees Dr. Rex Kras every 3 months for a UDS, as I understand.  I had a long chat with the patient about my  findings and the diagnosis of mixed headaches, the prognosis and treatment options. We talked about medical treatments and non-pharmacological approaches. We talked about smoking cessation and maintaining a healthy lifestyle in general. I encouraged the patient to eat healthy, exercise daily and keep well hydrated, to keep a scheduled bedtime and wake time routine, to not skip any meals and eat healthy snacks in between meals and to have protein with every meal. I advised the patient about common headache triggers: sleep deprivation, dehydration, overheating, stress, hypoglycemia or skipping meals and blood sugar fluctuations, excessive pain medications or excessive alcohol use or caffeine withdrawal. Some people have food triggers such as aged cheese, orange juice or chocolate, especially dark chocolate, or MSG (monosodium glutamate). She is to try to avoid these headache triggers as much possible. It may be helpful to keep a headache diary to figure out what makes Her headaches worse or brings them on and what alleviates them. Some people report headache onset after exercise but studies have shown that regular exercise may actually prevent headaches from coming. If She has exercise-induced headaches, She is advised to drink plenty of fluid before and after exercising and that to not overdo it and to not overheat. In addition, her history and physical exam is highly concerning for obstructive sleep apnea (OSA), due to snoring, EDS and  recurrent HAs, morbid obesity and tight airway. I also talked to the patient about the diagnosis of OSA, its prognosis and treatment options. We talked about medical treatments, surgical interventions and non-pharmacological approaches. I explained in particular the risks and ramifications of untreated moderate to severe OSA, especially with respect to developing cardiovascular disease down the Road, including congestive heart failure, difficult to treat hypertension, cardiac arrhythmias, or stroke. Even type 2 diabetes has, in part, been linked to untreated OSA. Symptoms of untreated OSA include daytime sleepiness, memory problems, mood irritability and mood disorder such as depression and anxiety, lack of energy, as well as recurrent headaches, especially morning headaches. We talked about smoking cessation and trying to maintain a healthy lifestyle in general, as well as the importance of weight control. I encouraged the patient to eat healthy, exercise daily and keep well hydrated, to keep a scheduled bedtime and wake time routine, to not skip any meals and eat healthy snacks in between meals. I advised the patient not to drive when feeling sleepy. I recommended the following at this time: sleep study with potential positive airway pressure titration and MRI brain and blood work. I explained the sleep test procedure to the patient and also outlined possible surgical and non-surgical treatment options of OSA, including the use of a custom-made dental device (which would require a referral to a specialist dentist or oral surgeon), upper airway surgical options, such as pillar implants, radiofrequency surgery, tongue base surgery, and UPPP (which would involve a referral to an ENT surgeon). Rarely, jaw surgery such as mandibular advancement may be considered.  I also explained the CPAP treatment option to the patient, who indicated that she would be willing to try CPAP if the need arises. I explained the  importance of being compliant with PAP treatment, not only for insurance purposes but primarily to improve Her symptoms, and for the patient's long term health benefit, including to reduce Her cardiovascular risks. I answered all her questions today and the patient was in agreement. I would like to see her back after the sleep study is completed and encouraged her to call with any interim questions, concerns, problems or updates.  Thank you very much for allowing me to participate in the care of this nice patient. If I can be of any further assistance to you please do not hesitate to call me at [phone].  Sincerely,   Star Age, MD, PhD

## 2013-10-05 NOTE — Patient Instructions (Signed)
I am particularly concerned about your daily use of pain killers in particular the overuse of ibuprofen. Talk to your primary care physician about ways to reduce her pain medications. We will do a sleep study to look for signs of obstructive sleep apnea as it can be a contributor to daytime sleepiness and recurrent headaches. Based on your symptoms and your exam I believe you are at risk for obstructive sleep apnea or OSA, and I think we should proceed with a sleep study to determine whether you do or do not have OSA and how severe it is. If you have more than mild OSA, I want you to consider treatment with CPAP. Please remember, the risks and ramifications of moderate to severe obstructive sleep apnea or OSA are: Cardiovascular disease, including congestive heart failure, stroke, difficult to control hypertension, arrhythmias, and even type 2 diabetes has been linked to untreated OSA. Sleep apnea causes disruption of sleep and sleep deprivation in most cases, which, in turn, can cause recurrent headaches, problems with memory, mood, concentration, focus, and vigilance. Most people with untreated sleep apnea report excessive daytime sleepiness, which can affect their ability to drive. Please do not drive if you feel sleepy.  I will see you back after your sleep study to go over the test results and where to go from there. We will call you after your sleep study and to set up an appointment at the time.  I will also order a brain MRI without contrast.  Please remember, common headache triggers are: sleep deprivation, dehydration, overheating, stress, hypoglycemia or skipping meals and blood sugar fluctuations, excessive pain medications or excessive alcohol use or caffeine withdrawal. Some people have food triggers such as aged cheese, orange juice or chocolate, especially dark chocolate, or MSG (monosodium glutamate). Try to avoid these headache triggers as much possible. It may be helpful to keep a headache  diary to figure out what makes your headaches worse or brings them on and what alleviates them. Some people report headache onset after exercise but studies have shown that regular exercise may actually prevent headaches from coming. If you have exercise-induced headaches, please make sure that you drink plenty of fluid before and after exercising and that you do not over do it and do not overheat.

## 2013-10-06 LAB — CBC WITH DIFFERENTIAL
Basophils Absolute: 0 10*3/uL (ref 0.0–0.2)
Basos: 0 %
EOS ABS: 0.1 10*3/uL (ref 0.0–0.4)
Eos: 1 %
HCT: 46.1 % (ref 34.0–46.6)
Hemoglobin: 16 g/dL — ABNORMAL HIGH (ref 11.1–15.9)
IMMATURE GRANS (ABS): 0 10*3/uL (ref 0.0–0.1)
Immature Granulocytes: 0 %
LYMPHS ABS: 2.8 10*3/uL (ref 0.7–3.1)
Lymphs: 37 %
MCH: 33.6 pg — AB (ref 26.6–33.0)
MCHC: 34.7 g/dL (ref 31.5–35.7)
MCV: 97 fL (ref 79–97)
MONOS ABS: 0.6 10*3/uL (ref 0.1–0.9)
Monocytes: 8 %
Neutrophils Absolute: 4.2 10*3/uL (ref 1.4–7.0)
Neutrophils Relative %: 54 %
PLATELETS: 262 10*3/uL (ref 150–379)
RBC: 4.76 x10E6/uL (ref 3.77–5.28)
RDW: 13.8 % (ref 12.3–15.4)
WBC: 7.8 10*3/uL (ref 3.4–10.8)

## 2013-10-06 LAB — COMPREHENSIVE METABOLIC PANEL
A/G RATIO: 1.6 (ref 1.1–2.5)
ALK PHOS: 93 IU/L (ref 39–117)
ALT: 41 IU/L — ABNORMAL HIGH (ref 0–32)
AST: 55 IU/L — ABNORMAL HIGH (ref 0–40)
Albumin: 4.4 g/dL (ref 3.5–5.5)
BUN/Creatinine Ratio: 18 (ref 9–23)
BUN: 23 mg/dL (ref 6–24)
CO2: 23 mmol/L (ref 18–29)
CREATININE: 1.28 mg/dL — AB (ref 0.57–1.00)
Calcium: 9.9 mg/dL (ref 8.7–10.2)
Chloride: 96 mmol/L — ABNORMAL LOW (ref 97–108)
GFR, EST AFRICAN AMERICAN: 58 mL/min/{1.73_m2} — AB (ref >59)
GFR, EST NON AFRICAN AMERICAN: 50 mL/min/{1.73_m2} — AB (ref >59)
GLOBULIN, TOTAL: 2.8 g/dL (ref 1.5–4.5)
Glucose: 98 mg/dL (ref 65–99)
Potassium: 5.2 mmol/L (ref 3.5–5.2)
SODIUM: 139 mmol/L (ref 134–144)
Total Protein: 7.2 g/dL (ref 6.0–8.5)

## 2013-10-09 NOTE — Progress Notes (Signed)
Quick Note:  Please advise Pamela Carney that there were some mild abnormalities in her blood work. Her kidney function shows mild or borderline degree of impairment. This may need to be followed by her primary care physician. In addition, her liver enzymes were mildly elevated which again is probably best followed by her primary care physician or her liver doctor. She had a borderline increase in hemoglobin and all in all I wouldn't recommend that she followup with her primary care physician on these tests. Star Age, MD, PhD [facility] ([facility]  ______

## 2013-10-17 ENCOUNTER — Encounter: Payer: Self-pay | Admitting: *Deleted

## 2013-10-17 NOTE — Progress Notes (Signed)
Quick Note:  I have attempted to call this pt 10-11-13 and then again 10-17-13. I have LM both times. I will send letter/ with copy of results of her labs and Dr. Guadelupe Sabin comments. ______

## 2013-12-27 ENCOUNTER — Encounter (HOSPITAL_BASED_OUTPATIENT_CLINIC_OR_DEPARTMENT_OTHER): Payer: 59 | Attending: General Surgery

## 2013-12-27 DIAGNOSIS — L97529 Non-pressure chronic ulcer of other part of left foot with unspecified severity: Secondary | ICD-10-CM | POA: Diagnosis not present

## 2013-12-27 DIAGNOSIS — L97929 Non-pressure chronic ulcer of unspecified part of left lower leg with unspecified severity: Secondary | ICD-10-CM | POA: Diagnosis not present

## 2013-12-27 DIAGNOSIS — I87332 Chronic venous hypertension (idiopathic) with ulcer and inflammation of left lower extremity: Secondary | ICD-10-CM | POA: Diagnosis not present

## 2013-12-28 NOTE — Progress Notes (Signed)
[facility]  NAME:  Pamela Carney, Pamela Carney              ACCOUNT NO.:  [id number]  MEDICAL RECORD NO.:  [id number]      DATE OF BIRTH:  Jan 10, 1966  PHYSICIAN:  Judene Companion, M.D.      VISIT DATE:  12/27/2013                                  OFFICE VISIT   A 48 year old morbidly obese female who comes to [place] because of venous ulcers on her left leg.  These are involved with venous hypertension and inflammation.  They are of significant size being about, one of them 7 cm in diameter and the other 1 cm in diameter.  The smaller one is on the left anterior and a larger one is left medial on her left lower extremity.  The patient has a history of being bipolar.  Her medicines include , Xanax, Lasix, Depakote, Diflucan, Cozaar, and   She will be treated with an Unna boot, and we will put silver alginate on the wound and see her back here in a week.  DIAGNOSIS:  Venous stasis ulcers with hypertension and inflammation, left leg; morbid obesity; hypertension; and bipolar state.     Judene Companion, M.D.     PP/MEDQ  D:  12/27/2013  T:  12/28/2013  Job:  [id number]

## 2014-01-02 ENCOUNTER — Ambulatory Visit: Payer: 59 | Admitting: Family

## 2014-01-03 DIAGNOSIS — L97929 Non-pressure chronic ulcer of unspecified part of left lower leg with unspecified severity: Secondary | ICD-10-CM | POA: Diagnosis not present

## 2014-01-03 DIAGNOSIS — L97529 Non-pressure chronic ulcer of other part of left foot with unspecified severity: Secondary | ICD-10-CM | POA: Diagnosis not present

## 2014-01-03 DIAGNOSIS — I87332 Chronic venous hypertension (idiopathic) with ulcer and inflammation of left lower extremity: Secondary | ICD-10-CM | POA: Diagnosis not present

## 2014-01-09 ENCOUNTER — Ambulatory Visit: Payer: 59 | Admitting: Family

## 2014-01-10 DIAGNOSIS — L97529 Non-pressure chronic ulcer of other part of left foot with unspecified severity: Secondary | ICD-10-CM | POA: Diagnosis not present

## 2014-01-10 DIAGNOSIS — I87332 Chronic venous hypertension (idiopathic) with ulcer and inflammation of left lower extremity: Secondary | ICD-10-CM | POA: Diagnosis not present

## 2014-01-10 DIAGNOSIS — L97929 Non-pressure chronic ulcer of unspecified part of left lower leg with unspecified severity: Secondary | ICD-10-CM | POA: Diagnosis not present

## 2014-01-11 ENCOUNTER — Other Ambulatory Visit (HOSPITAL_BASED_OUTPATIENT_CLINIC_OR_DEPARTMENT_OTHER): Payer: Self-pay | Admitting: General Surgery

## 2014-01-11 ENCOUNTER — Ambulatory Visit (HOSPITAL_COMMUNITY)
Admission: RE | Admit: 2014-01-11 | Discharge: 2014-01-11 | Disposition: A | Discharge status: Home / Self Care | Payer: 59 | Source: Ambulatory Visit | Attending: Vascular Surgery | Admitting: Vascular Surgery

## 2014-01-11 ENCOUNTER — Ambulatory Visit (INDEPENDENT_AMBULATORY_CARE_PROVIDER_SITE_OTHER)
Admission: RE | Admit: 2014-01-11 | Discharge: 2014-01-11 | Disposition: A | Discharge status: Home / Self Care | Payer: 59 | Source: Ambulatory Visit | Attending: Vascular Surgery | Admitting: Vascular Surgery

## 2014-01-11 DIAGNOSIS — L97929 Non-pressure chronic ulcer of unspecified part of left lower leg with unspecified severity: Secondary | ICD-10-CM | POA: Insufficient documentation

## 2014-01-11 DIAGNOSIS — Z720 Tobacco use: Secondary | ICD-10-CM | POA: Insufficient documentation

## 2014-01-11 DIAGNOSIS — I10 Essential (primary) hypertension: Secondary | ICD-10-CM | POA: Diagnosis not present

## 2014-01-12 DIAGNOSIS — I87332 Chronic venous hypertension (idiopathic) with ulcer and inflammation of left lower extremity: Secondary | ICD-10-CM | POA: Diagnosis not present

## 2014-01-16 ENCOUNTER — Ambulatory Visit: Payer: 59 | Admitting: Family

## 2014-01-16 DIAGNOSIS — Z0289 Encounter for other administrative examinations: Secondary | ICD-10-CM

## 2014-01-19 DIAGNOSIS — I87332 Chronic venous hypertension (idiopathic) with ulcer and inflammation of left lower extremity: Secondary | ICD-10-CM | POA: Diagnosis not present

## 2014-01-19 DIAGNOSIS — L97929 Non-pressure chronic ulcer of unspecified part of left lower leg with unspecified severity: Secondary | ICD-10-CM | POA: Diagnosis not present

## 2014-01-19 DIAGNOSIS — L97529 Non-pressure chronic ulcer of other part of left foot with unspecified severity: Secondary | ICD-10-CM | POA: Diagnosis not present

## 2014-01-22 ENCOUNTER — Encounter: Payer: Self-pay | Admitting: Neurology

## 2014-01-30 ENCOUNTER — Encounter: Payer: Self-pay | Admitting: Vascular Surgery

## 2014-01-31 ENCOUNTER — Encounter: Payer: Self-pay | Admitting: Vascular Surgery

## 2014-01-31 ENCOUNTER — Ambulatory Visit (INDEPENDENT_AMBULATORY_CARE_PROVIDER_SITE_OTHER): Payer: 59 | Admitting: Vascular Surgery

## 2014-01-31 VITALS — BP 204/86 | HR 56 | Ht 62.0 in | Wt 262.0 lb

## 2014-01-31 DIAGNOSIS — G5793 Unspecified mononeuropathy of bilateral lower limbs: Secondary | ICD-10-CM | POA: Insufficient documentation

## 2014-01-31 DIAGNOSIS — G5791 Unspecified mononeuropathy of right lower limb: Secondary | ICD-10-CM

## 2014-01-31 DIAGNOSIS — G5792 Unspecified mononeuropathy of left lower limb: Secondary | ICD-10-CM

## 2014-01-31 NOTE — Progress Notes (Signed)
[facility] NOTE   MRN : [id number]  Reason for Consult: Left foot ulcers Referring Physician: Dr. Judene Companion  History of Present Illness: this 48 y/o female is here secondary to chronic left foot and leg ulcers of unknown cause.  She states it took over 6 months for these wounds to heal.  She has burning and pain in both lower extremities.  She denies any intermittent claudication or rest pain.  She denies sx consistent with chronic venous insufficiency.  Past medical history includes: Hypertension treated with Beta blocker and Cozaar.  She denise hyperlipidemia and DM.   She has a 35 year history of tobacco use.       Current Outpatient Prescriptions  Medication Sig Dispense Refill  . ALPRAZolam (XANAX) 0.5 MG tablet Take 0.5 mg by mouth 4 (four) times daily.    . bisoprolol-hydrochlorothiazide (ZIAC) 10-6.25 MG per tablet Take 1 tablet by mouth daily.    . butalbital-acetaminophen-caffeine (FIORICET WITH CODEINE) 50-325-40-30 MG per capsule Take 2 capsules by mouth 3 (three) times daily.    . divalproex (DEPAKOTE ER) 500 MG 24 hr tablet Take 2,000 mg by mouth daily.    . fluconazole (DIFLUCAN) 150 MG tablet Take 1 tablet (150 mg total) by mouth daily. 2 tablet 1  . HYDROcodone-acetaminophen (NORCO/VICODIN) 5-325 MG per tablet Take 2 tablets by mouth every 4 (four) hours as needed. 20 tablet 0  . ibuprofen (ADVIL,MOTRIN) 800 MG tablet Take 1 tablet (800 mg total) by mouth every 8 (eight) hours as needed for pain. 30 tablet 5  . lamoTRIgine (LAMICTAL) 100 MG tablet Take 100 mg by mouth daily.    Marland Kitchen losartan (COZAAR) 25 MG tablet Take 25 mg by mouth daily.     No current facility-administered medications for this visit.    Pt meds include: Statin :No Betablocker: Yes ASA: No Other anticoagulants/antiplatelets: none  Past Medical History  Diagnosis Date  . Hypertension   . Abnormal Pap smear   . Genital warts     Hx of genital  .  Anxiety   . Bipolar 1 disorder   . Atrial fibrillation     Past Surgical History  Procedure Laterality Date  . Cesarean section    . Cervical cone biopsy    . Ovarian cyst removal    . Dilation and curettage of uterus    . Unilateral salpingectomy      Pt unsure of which fallopian tube was removed    Social History History  Substance Use Topics  . Smoking status: Current Every Day Smoker -- 1.00 packs/day for 32 years    Types: Cigarettes  . Smokeless tobacco: Never Used  . Alcohol Use: No    Family History Family History  Problem Relation Age of Onset  . Diabetes Paternal Grandfather   . COPD Paternal Grandfather   . Heart disease Paternal Grandfather   . Cancer Paternal Grandmother   . Heart disease Paternal Grandmother   . Cancer Father   . Hypertension Father   . Heart attack Father   . Heart failure Mother   . Diabetes Mother   . Heart disease Mother   . Hyperlipidemia Mother   . Hypertension Mother   . Heart attack Mother   . Peripheral vascular disease Mother   . Hypertension Sister   . Heart attack Sister     Allergies  Allergen Reactions  . Sulfa Antibiotics Hives and Swelling     REVIEW OF SYSTEMS  General: [ ]  Weight loss, [ ]  Fever, [ ]  chills [x] obese Neurologic: [ ]  Dizziness, [ ]  Blackouts, [ ]  Seizure [ ]  Stroke, [ ]  "Mini stroke", [ ]  Slurred speech, [ ]  Temporary blindness; [ ]  weakness in arms or legs, [ ]  Hoarseness [ ]  Dysphagia Cardiac: [ ]  Chest pain/pressure, [ ]  Shortness of breath at rest [ ]  Shortness of breath with exertion, [ ]  Atrial fibrillation or irregular heartbeat  Vascular: [ ]  Pain in legs with walking, [ ]  Pain in legs at rest, [ ]  Pain in legs at night,  [ ]  Non-healing ulcer, [ ]  Blood clot in vein/DVT,   Pulmonary: [ ]  Home oxygen, [ ]  Productive cough, [ ]  Coughing up blood, [ ]  Asthma,  [ ]  Wheezing [ ]  COPD Musculoskeletal:  [ ]  Arthritis, [ ]  Low back pain, [ ]  Joint pain Hematologic: [ ]  Easy Bruising, [ ]   Anemia; [ ]  Hepatitis Gastrointestinal: [ ]  Blood in stool, [ ]  Gastroesophageal Reflux/heartburn, Urinary: [ ]  chronic Kidney disease, [ ]  on HD - [ ]  MWF or [ ]  TTHS, [ ]  Burning with urination, [ ]  Difficulty urinating Skin: [x ] Rashes, [ ]  Wounds Psychological: [x ] Anxiety, [x ] Depression[x]  bipolar   Physical Examination Filed Vitals:   01/31/14 1515  BP: 204/86  Pulse: 56  Height: 5\' 2"  (1.575 m)  Weight: 262 lb (118.842 kg)  SpO2: 95%   Body mass index is 47.91 kg/(m^2).  General:  WDWN in NAD Obese  Gait: Normal HENT: WNL Eyes: Pupils equal Pulmonary: normal non-labored breathing , without Rales, rhonchi,  wheezing Cardiac: RRR, without  Murmurs, rubs or gallops;No carotid bruits Abdomen: soft, NT, no masses Skin: no rashes, ulcers noted;  no Gangrene , no cellulitis; no open wounds; areas of hyperpigmentation is distal L lower leg Vascular Exam/Pulses:Palpable radial, femoral, Dp/PT bilaterally Musculoskeletal: no muscle wasting or atrophy; no edema  Neurologic: A&O X 3; Appropriate Affect ; SENSATION: normal; MOTOR FUNCTION: 5/5 Symmetric; Speech is fluent/normal Psychiatric: Judgment intact, Mood & affect appropriate for pt's clinical situation Lymph : No Cervical, Axillary, or Inguinal lymphadenopathy     Non-Invasive Vascular Imaging:  ABI right triphasic flow 0.92 Left triphasic flow 0.85  Venous duplex no reflux, normal return without DVT  ASSESSMENT/PLAN:  Idiopathic Peripheral Neuropathy  Her arterial and venous systems are normal.  She may benefit from Neurontin or Lyrica to help with the symptoms of burning and pain in her legs.  She will f/u PRN.   She was seen in our office toad in conjunction with Dr. Cloyde Reams, [facility] 01/31/2014 3:39 PM  Addendum  I have independently interviewed and examined the patient, and I agree with the physician assistant's findings.  BLE venous insufficiency duplex revealed normal venous valvular function.   Her BLE ABI are also completely normal.  She has neuropathic sx in both legs but her arterial and venous structures do not account her sx.  Adele Barthel, MD [facility] [place] Office: [phone] Pager: [phone]  01/31/2014, 4:02 PM

## 2014-02-09 ENCOUNTER — Encounter: Payer: 59 | Admitting: Vascular Surgery

## 2017-12-23 ENCOUNTER — Inpatient Hospital Stay (HOSPITAL_COMMUNITY)
Admission: EM | Admit: 2017-12-23 | Discharge: 2017-12-29 | DRG: 871 | Disposition: A | Discharge status: Home Health | Payer: 59 | Attending: Internal Medicine | Admitting: Internal Medicine

## 2017-12-23 ENCOUNTER — Other Ambulatory Visit: Payer: Self-pay

## 2017-12-23 ENCOUNTER — Emergency Department (HOSPITAL_COMMUNITY): Payer: 59

## 2017-12-23 ENCOUNTER — Encounter (HOSPITAL_COMMUNITY): Payer: Self-pay

## 2017-12-23 DIAGNOSIS — R6521 Severe sepsis with septic shock: Secondary | ICD-10-CM | POA: Diagnosis not present

## 2017-12-23 DIAGNOSIS — N3001 Acute cystitis with hematuria: Secondary | ICD-10-CM | POA: Diagnosis present

## 2017-12-23 DIAGNOSIS — Z91040 Latex allergy status: Secondary | ICD-10-CM

## 2017-12-23 DIAGNOSIS — Z791 Long term (current) use of non-steroidal anti-inflammatories (NSAID): Secondary | ICD-10-CM

## 2017-12-23 DIAGNOSIS — K5730 Diverticulosis of large intestine without perforation or abscess without bleeding: Secondary | ICD-10-CM

## 2017-12-23 DIAGNOSIS — F319 Bipolar disorder, unspecified: Secondary | ICD-10-CM | POA: Diagnosis present

## 2017-12-23 DIAGNOSIS — G43909 Migraine, unspecified, not intractable, without status migrainosus: Secondary | ICD-10-CM | POA: Diagnosis present

## 2017-12-23 DIAGNOSIS — Z882 Allergy status to sulfonamides status: Secondary | ICD-10-CM

## 2017-12-23 DIAGNOSIS — T502X5A Adverse effect of carbonic-anhydrase inhibitors, benzothiadiazides and other diuretics, initial encounter: Secondary | ICD-10-CM | POA: Diagnosis present

## 2017-12-23 DIAGNOSIS — R6520 Severe sepsis without septic shock: Secondary | ICD-10-CM

## 2017-12-23 DIAGNOSIS — F17210 Nicotine dependence, cigarettes, uncomplicated: Secondary | ICD-10-CM | POA: Diagnosis present

## 2017-12-23 DIAGNOSIS — Z809 Family history of malignant neoplasm, unspecified: Secondary | ICD-10-CM

## 2017-12-23 DIAGNOSIS — A4151 Sepsis due to Escherichia coli [E. coli]: Secondary | ICD-10-CM | POA: Diagnosis not present

## 2017-12-23 DIAGNOSIS — K8020 Calculus of gallbladder without cholecystitis without obstruction: Secondary | ICD-10-CM | POA: Diagnosis present

## 2017-12-23 DIAGNOSIS — T8389XA Other specified complication of genitourinary prosthetic devices, implants and grafts, initial encounter: Secondary | ICD-10-CM | POA: Diagnosis present

## 2017-12-23 DIAGNOSIS — E872 Acidosis, unspecified: Secondary | ICD-10-CM | POA: Diagnosis present

## 2017-12-23 DIAGNOSIS — F419 Anxiety disorder, unspecified: Secondary | ICD-10-CM | POA: Diagnosis present

## 2017-12-23 DIAGNOSIS — N200 Calculus of kidney: Secondary | ICD-10-CM | POA: Diagnosis present

## 2017-12-23 DIAGNOSIS — A419 Sepsis, unspecified organism: Secondary | ICD-10-CM

## 2017-12-23 DIAGNOSIS — N390 Urinary tract infection, site not specified: Secondary | ICD-10-CM | POA: Diagnosis present

## 2017-12-23 DIAGNOSIS — Z90721 Acquired absence of ovaries, unilateral: Secondary | ICD-10-CM

## 2017-12-23 DIAGNOSIS — N179 Acute kidney failure, unspecified: Secondary | ICD-10-CM | POA: Diagnosis present

## 2017-12-23 DIAGNOSIS — E162 Hypoglycemia, unspecified: Secondary | ICD-10-CM | POA: Diagnosis not present

## 2017-12-23 DIAGNOSIS — T465X5A Adverse effect of other antihypertensive drugs, initial encounter: Secondary | ICD-10-CM | POA: Diagnosis present

## 2017-12-23 DIAGNOSIS — T39391A Poisoning by other nonsteroidal anti-inflammatory drugs [NSAID], accidental (unintentional), initial encounter: Secondary | ICD-10-CM | POA: Diagnosis present

## 2017-12-23 DIAGNOSIS — N183 Chronic kidney disease, stage 3 unspecified: Secondary | ICD-10-CM

## 2017-12-23 DIAGNOSIS — Y768 Miscellaneous obstetric and gynecological devices associated with adverse incidents, not elsewhere classified: Secondary | ICD-10-CM | POA: Diagnosis present

## 2017-12-23 DIAGNOSIS — Z825 Family history of asthma and other chronic lower respiratory diseases: Secondary | ICD-10-CM

## 2017-12-23 DIAGNOSIS — Z833 Family history of diabetes mellitus: Secondary | ICD-10-CM

## 2017-12-23 DIAGNOSIS — Z91048 Other nonmedicinal substance allergy status: Secondary | ICD-10-CM

## 2017-12-23 DIAGNOSIS — Z6841 Body Mass Index (BMI) 40.0 and over, adult: Secondary | ICD-10-CM

## 2017-12-23 DIAGNOSIS — X58XXXA Exposure to other specified factors, initial encounter: Secondary | ICD-10-CM | POA: Diagnosis present

## 2017-12-23 DIAGNOSIS — I4891 Unspecified atrial fibrillation: Secondary | ICD-10-CM | POA: Diagnosis present

## 2017-12-23 DIAGNOSIS — E876 Hypokalemia: Secondary | ICD-10-CM | POA: Diagnosis not present

## 2017-12-23 DIAGNOSIS — R011 Cardiac murmur, unspecified: Secondary | ICD-10-CM | POA: Diagnosis present

## 2017-12-23 DIAGNOSIS — N119 Chronic tubulo-interstitial nephritis, unspecified: Secondary | ICD-10-CM | POA: Diagnosis present

## 2017-12-23 DIAGNOSIS — B370 Candidal stomatitis: Secondary | ICD-10-CM | POA: Diagnosis present

## 2017-12-23 DIAGNOSIS — I129 Hypertensive chronic kidney disease with stage 1 through stage 4 chronic kidney disease, or unspecified chronic kidney disease: Secondary | ICD-10-CM | POA: Diagnosis present

## 2017-12-23 DIAGNOSIS — R531 Weakness: Secondary | ICD-10-CM

## 2017-12-23 DIAGNOSIS — Z79899 Other long term (current) drug therapy: Secondary | ICD-10-CM

## 2017-12-23 DIAGNOSIS — N139 Obstructive and reflux uropathy, unspecified: Secondary | ICD-10-CM | POA: Diagnosis present

## 2017-12-23 DIAGNOSIS — Z8249 Family history of ischemic heart disease and other diseases of the circulatory system: Secondary | ICD-10-CM

## 2017-12-23 DIAGNOSIS — Z8349 Family history of other endocrine, nutritional and metabolic diseases: Secondary | ICD-10-CM

## 2017-12-23 DIAGNOSIS — G629 Polyneuropathy, unspecified: Secondary | ICD-10-CM | POA: Diagnosis present

## 2017-12-23 HISTORY — DX: Calculus of kidney: N20.0

## 2017-12-23 HISTORY — DX: Diverticulosis of large intestine without perforation or abscess without bleeding: K57.30

## 2017-12-23 HISTORY — DX: Calculus of gallbladder without cholecystitis without obstruction: K80.20

## 2017-12-23 LAB — CBC WITH DIFFERENTIAL/PLATELET
Abs Immature Granulocytes: 0.5 10*3/uL — ABNORMAL HIGH (ref 0.0–0.1)
BASOS ABS: 0.1 10*3/uL (ref 0.0–0.1)
BASOS PCT: 1 %
EOS PCT: 1 %
Eosinophils Absolute: 0.1 10*3/uL (ref 0.0–0.7)
HCT: 45.9 % (ref 36.0–46.0)
Hemoglobin: 15.2 g/dL — ABNORMAL HIGH (ref 12.0–15.0)
Immature Granulocytes: 2 %
Lymphocytes Relative: 7 %
Lymphs Abs: 1.3 10*3/uL (ref 0.7–4.0)
MCH: 31.1 pg (ref 26.0–34.0)
MCHC: 33.1 g/dL (ref 30.0–36.0)
MCV: 94.1 fL (ref 78.0–100.0)
MONO ABS: 0.8 10*3/uL (ref 0.1–1.0)
Monocytes Relative: 4 %
NEUTROS ABS: 17.3 10*3/uL — AB (ref 1.7–7.7)
Neutrophils Relative %: 85 %
Platelets: 381 10*3/uL (ref 150–400)
RBC: 4.88 MIL/uL (ref 3.87–5.11)
RDW: 15.7 % — AB (ref 11.5–15.5)
WBC: 20.2 10*3/uL — ABNORMAL HIGH (ref 4.0–10.5)

## 2017-12-23 LAB — COMPREHENSIVE METABOLIC PANEL
ALT: 17 U/L (ref 0–44)
AST: 19 U/L (ref 15–41)
Albumin: 3.1 g/dL — ABNORMAL LOW (ref 3.5–5.0)
Alkaline Phosphatase: 89 U/L (ref 38–126)
BILIRUBIN TOTAL: 1.5 mg/dL — AB (ref 0.3–1.2)
BUN: 84 mg/dL — AB (ref 6–20)
CO2: <7 mmol/L — ABNORMAL LOW (ref 22–32)
Calcium: 8 mg/dL — ABNORMAL LOW (ref 8.9–10.3)
Chloride: 100 mmol/L (ref 98–111)
Creatinine, Ser: 5.15 mg/dL — ABNORMAL HIGH (ref 0.44–1.00)
GFR calc Af Amer: 10 mL/min — ABNORMAL LOW (ref >60)
GFR calc non Af Amer: 9 mL/min — ABNORMAL LOW (ref >60)
Glucose, Bld: 129 mg/dL — ABNORMAL HIGH (ref 70–99)
POTASSIUM: 4.5 mmol/L (ref 3.5–5.1)
Sodium: 130 mmol/L — ABNORMAL LOW (ref 135–145)
TOTAL PROTEIN: 6.6 g/dL (ref 6.5–8.1)

## 2017-12-23 LAB — URINALYSIS, ROUTINE W REFLEX MICROSCOPIC
Bilirubin Urine: NEGATIVE
Glucose, UA: 50 mg/dL — AB
Ketones, ur: NEGATIVE mg/dL
Nitrite: NEGATIVE
PROTEIN: 100 mg/dL — AB
RBC / HPF: >50 RBC/hpf — ABNORMAL HIGH (ref 0–5)
Specific Gravity, Urine: 1.014 (ref 1.005–1.030)
pH: 5 (ref 5.0–8.0)

## 2017-12-23 LAB — PROTIME-INR
INR: 1.38
Prothrombin Time: 16.9 seconds — ABNORMAL HIGH (ref 11.4–15.2)

## 2017-12-23 LAB — I-STAT CG4 LACTIC ACID, ED: LACTIC ACID, VENOUS: 0.73 mmol/L (ref 0.5–1.9)

## 2017-12-23 IMAGING — CT CT RENAL STONE PROTOCOL
2 of 4 series · 16 of 46 positions shown, 18 images · non-contrast
Comparison: [DATE]

CLINICAL DATA: Dysuria, weakness

EXAM:
CT ABDOMEN AND PELVIS WITHOUT CONTRAST
TECHNIQUE: Multidetector CT imaging of the abdomen and pelvis was performed
following the standard protocol without IV contrast.

[Series 3: stone study 5.0 i30f 2 · axial · 0.98mm/px · z∈[+786,+1206]mm · 13 of 92 slices shown, 15 images]
[im 4/92  soft-tissue]
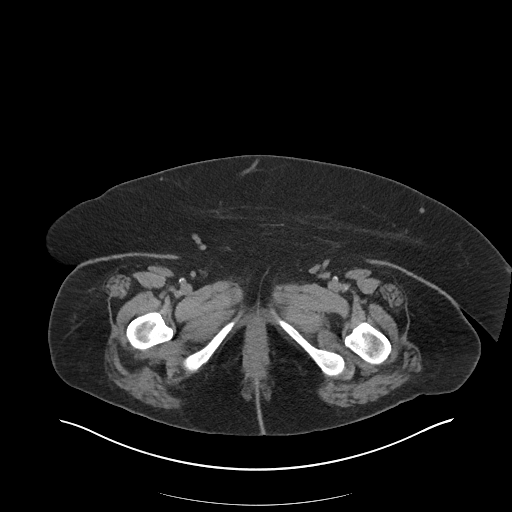
[im 4/92  bone]
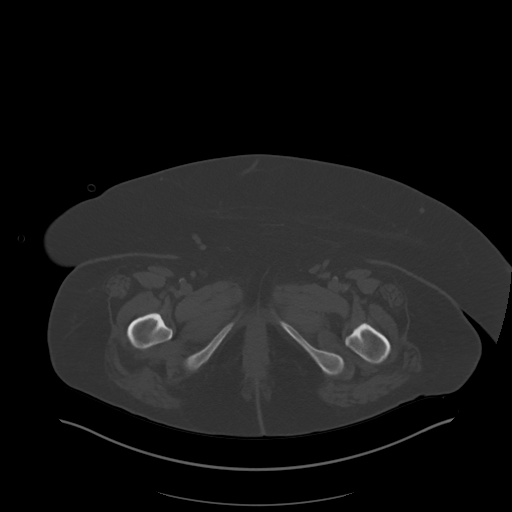
[im 12/92  soft-tissue]
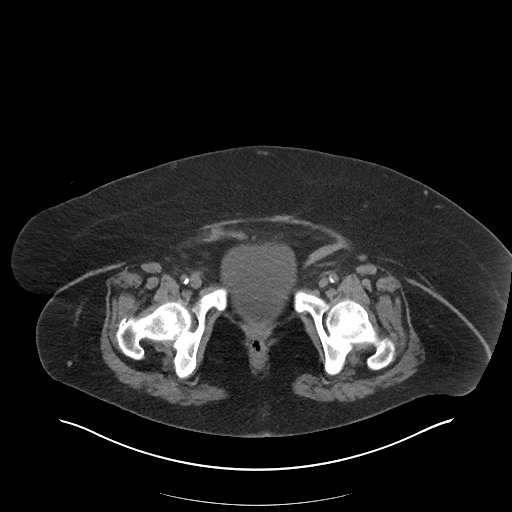
[im 19/92  soft-tissue]
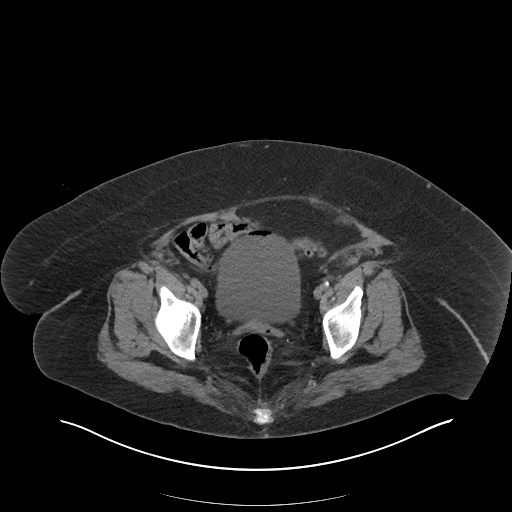
[im 27/92  soft-tissue]
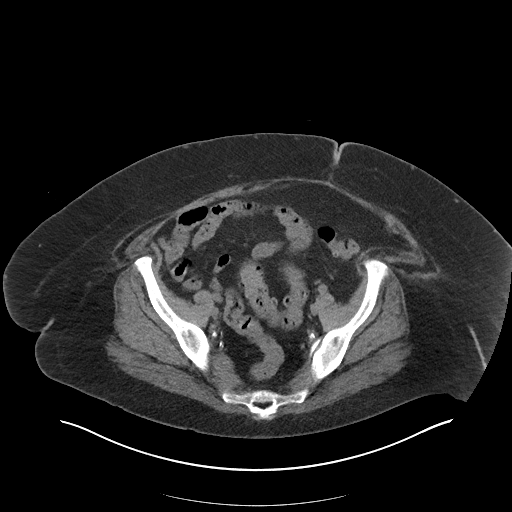
[im 31/92  soft-tissue]
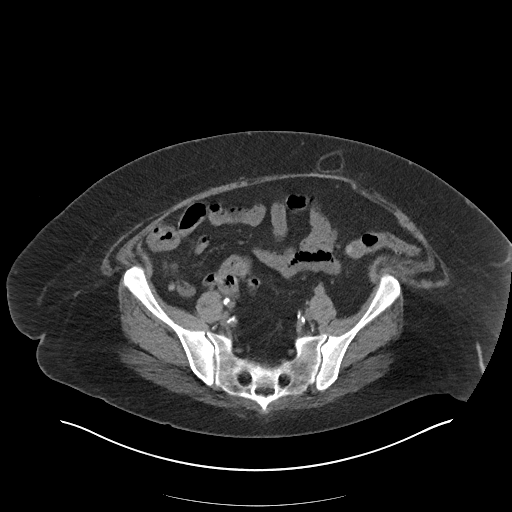
[im 38/92  soft-tissue]
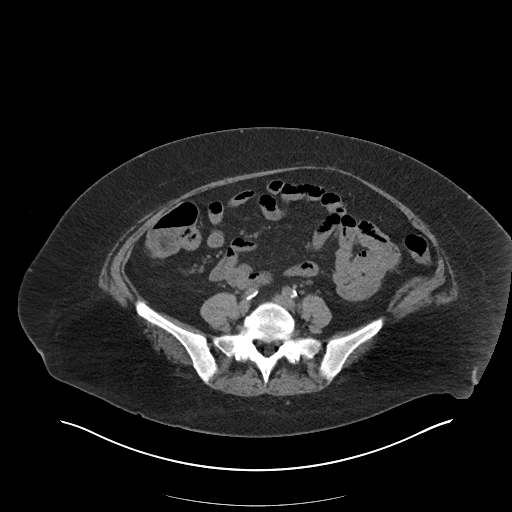
[im 46/92  soft-tissue]
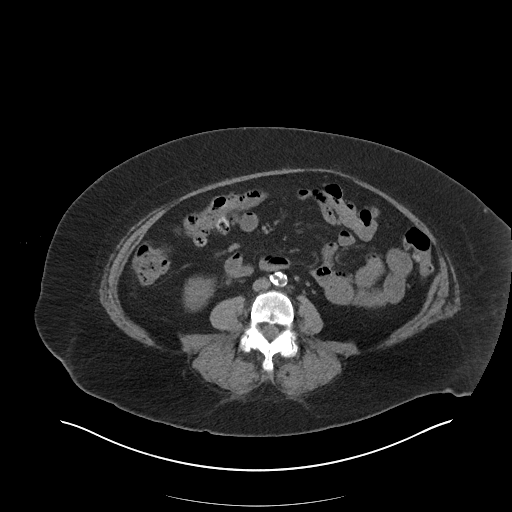
[im 54/92  soft-tissue]
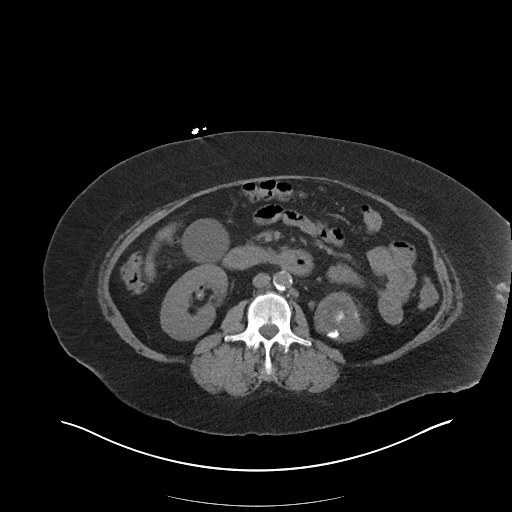
[im 61/92  soft-tissue]
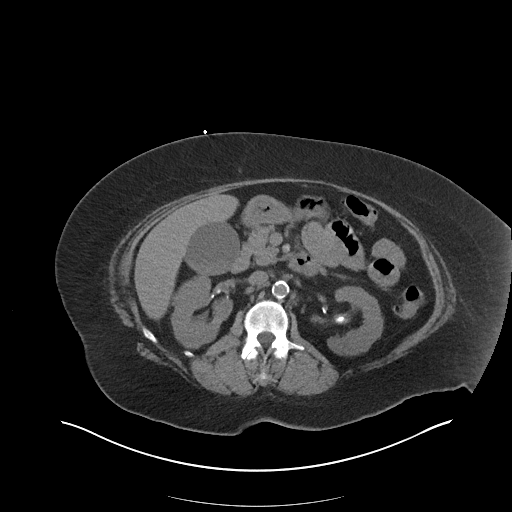
[im 61/92  bone]
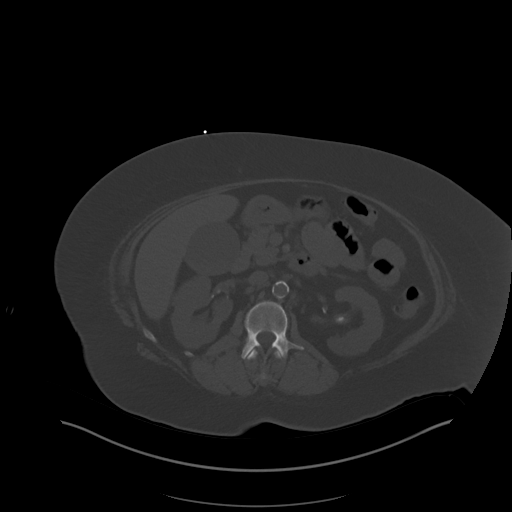
[im 65/92  soft-tissue]
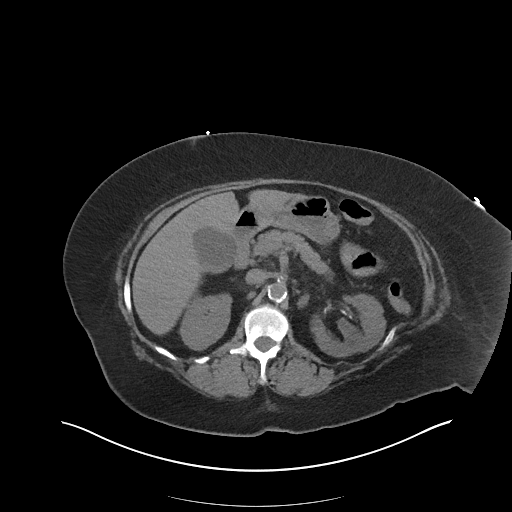
[im 73/92  soft-tissue]
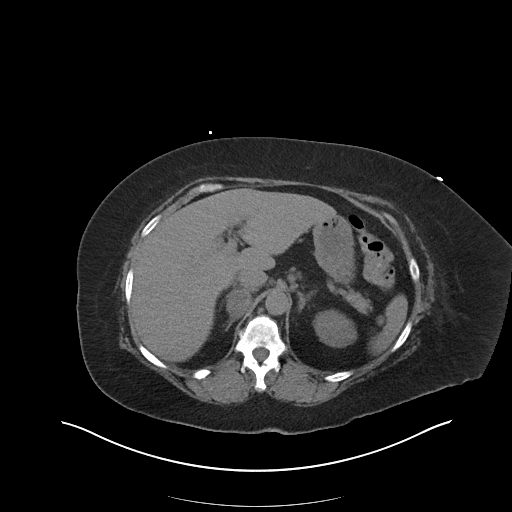
[im 80/92  soft-tissue]
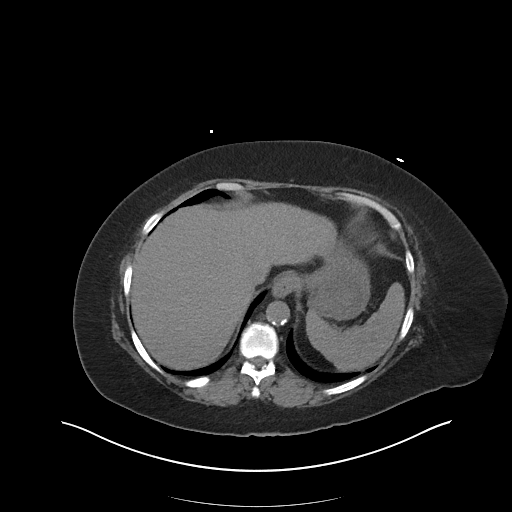
[im 88/92  soft-tissue]
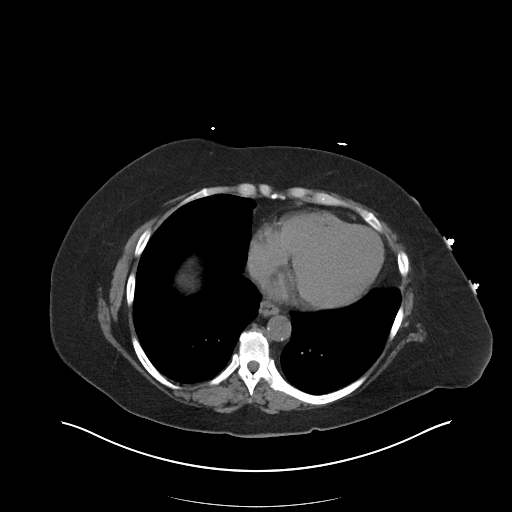

[Series 6: coronal soft tissue · coronal · 0.90mm/px · 3 of 101 slices shown]
[im 34/101  soft-tissue]
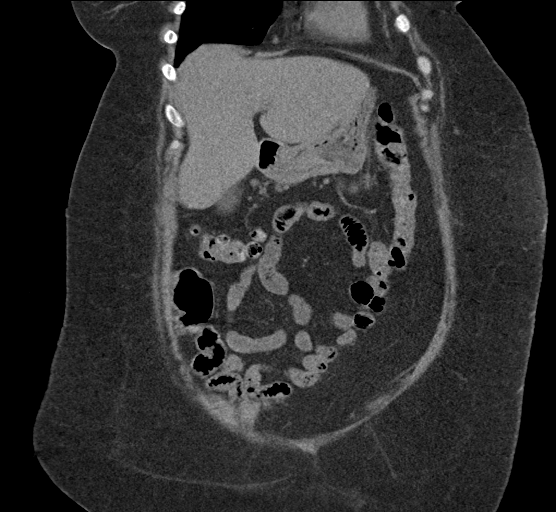
[im 45/101  soft-tissue]
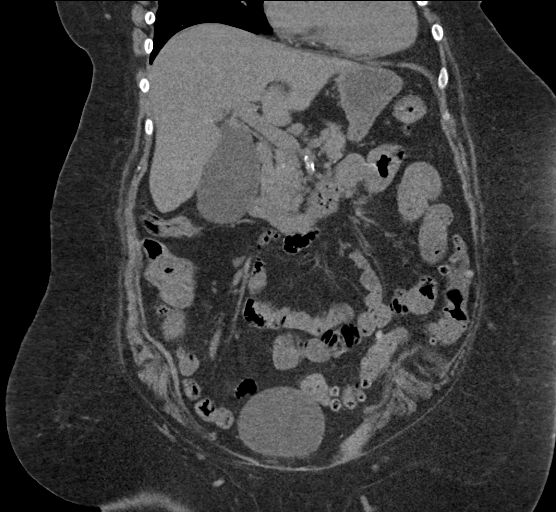
[im 56/101  soft-tissue]
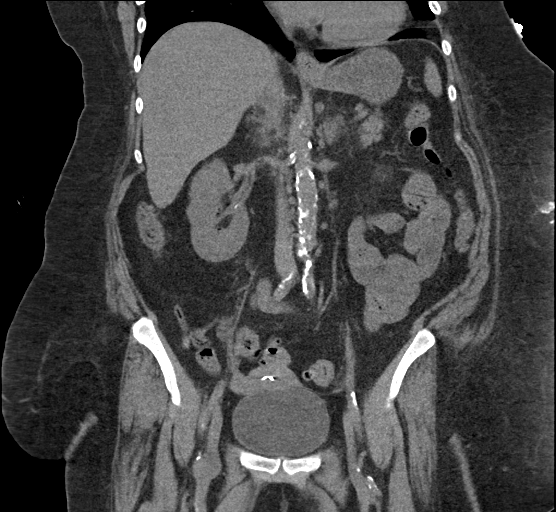

[16 of 46 positions shown; findings below may reference images not displayed]

FINDINGS: Lower chest: Lung bases are clear.

Hepatobiliary: Mild hepatic steatosis.

Layering tiny gallstones (series 3/image 29), without associated
inflammatory changes. No intrahepatic or extrahepatic ductal
dilatation.

Pancreas: Within normal limits.

Spleen: Within normal limits.

Adrenals/Urinary Tract: 2.9 cm right adrenal adenoma (series 3/image
19), previously 2.2 cm. Left adrenal gland is within normal limits.

Right kidney is notable for a 5 mm nonobstructing right lower pole
renal calculus (series 3/image 41). Vascular calcifications. No
hydronephrosis.

Left kidney is notable for multiple calculi, including a 2.2 cm
lower pole partial staghorn calculus and a 12 mm calculus in the
left proximal collecting system. Layering calcifications in a left
lower pole calyceal diverticulum (series 3/image 39). No
hydronephrosis.

Bladder is notable for nondependent gas.

Stomach/Bowel: Stomach is within normal limits.

No evidence of bowel obstruction.

Normal appendix (series 3/image 58).

Sigmoid diverticulosis, without evidence of diverticulitis.

Vascular/Lymphatic: No evidence of abdominal aortic aneurysm.

Prominent para-aortic nodes measuring up to 10 mm short axis (series
3/image 34), at the upper limits of normal.

Reproductive: Uterine IUD, mildly obliqued, with the right arm
possibly embedded in the myometrium (series 3/image 69).

Bilateral ovaries are unremarkable.

Other: No abdominopelvic ascites.

Tiny fat containing periumbilical hernia (series 3/image 62).

Musculoskeletal: Mild degenerative changes of the visualized
thoracolumbar spine.
IMPRESSION: Multiple bilateral nonobstructing renal calculi, including a
dominant 2.2 cm lower pole partial staghorn calculus on the left. No
hydronephrosis.

Nondependent gas in the bladder, correlate for cystitis.

No evidence of bowel obstruction. Normal appendix. Sigmoid
diverticulosis, without evidence of diverticulitis.

Cholelithiasis, without associated inflammatory changes.

Uterine IUD may be mildly obliqued, with its right arm possibly
embedded in the myometrium.

## 2017-12-23 IMAGING — CR DG CHEST 2V
2 series · 2 of 2 positions shown · non-contrast
Comparison: None.

CLINICAL DATA: Dysuria, weakness

EXAM:
CHEST - 2 VIEW

[chest lat]
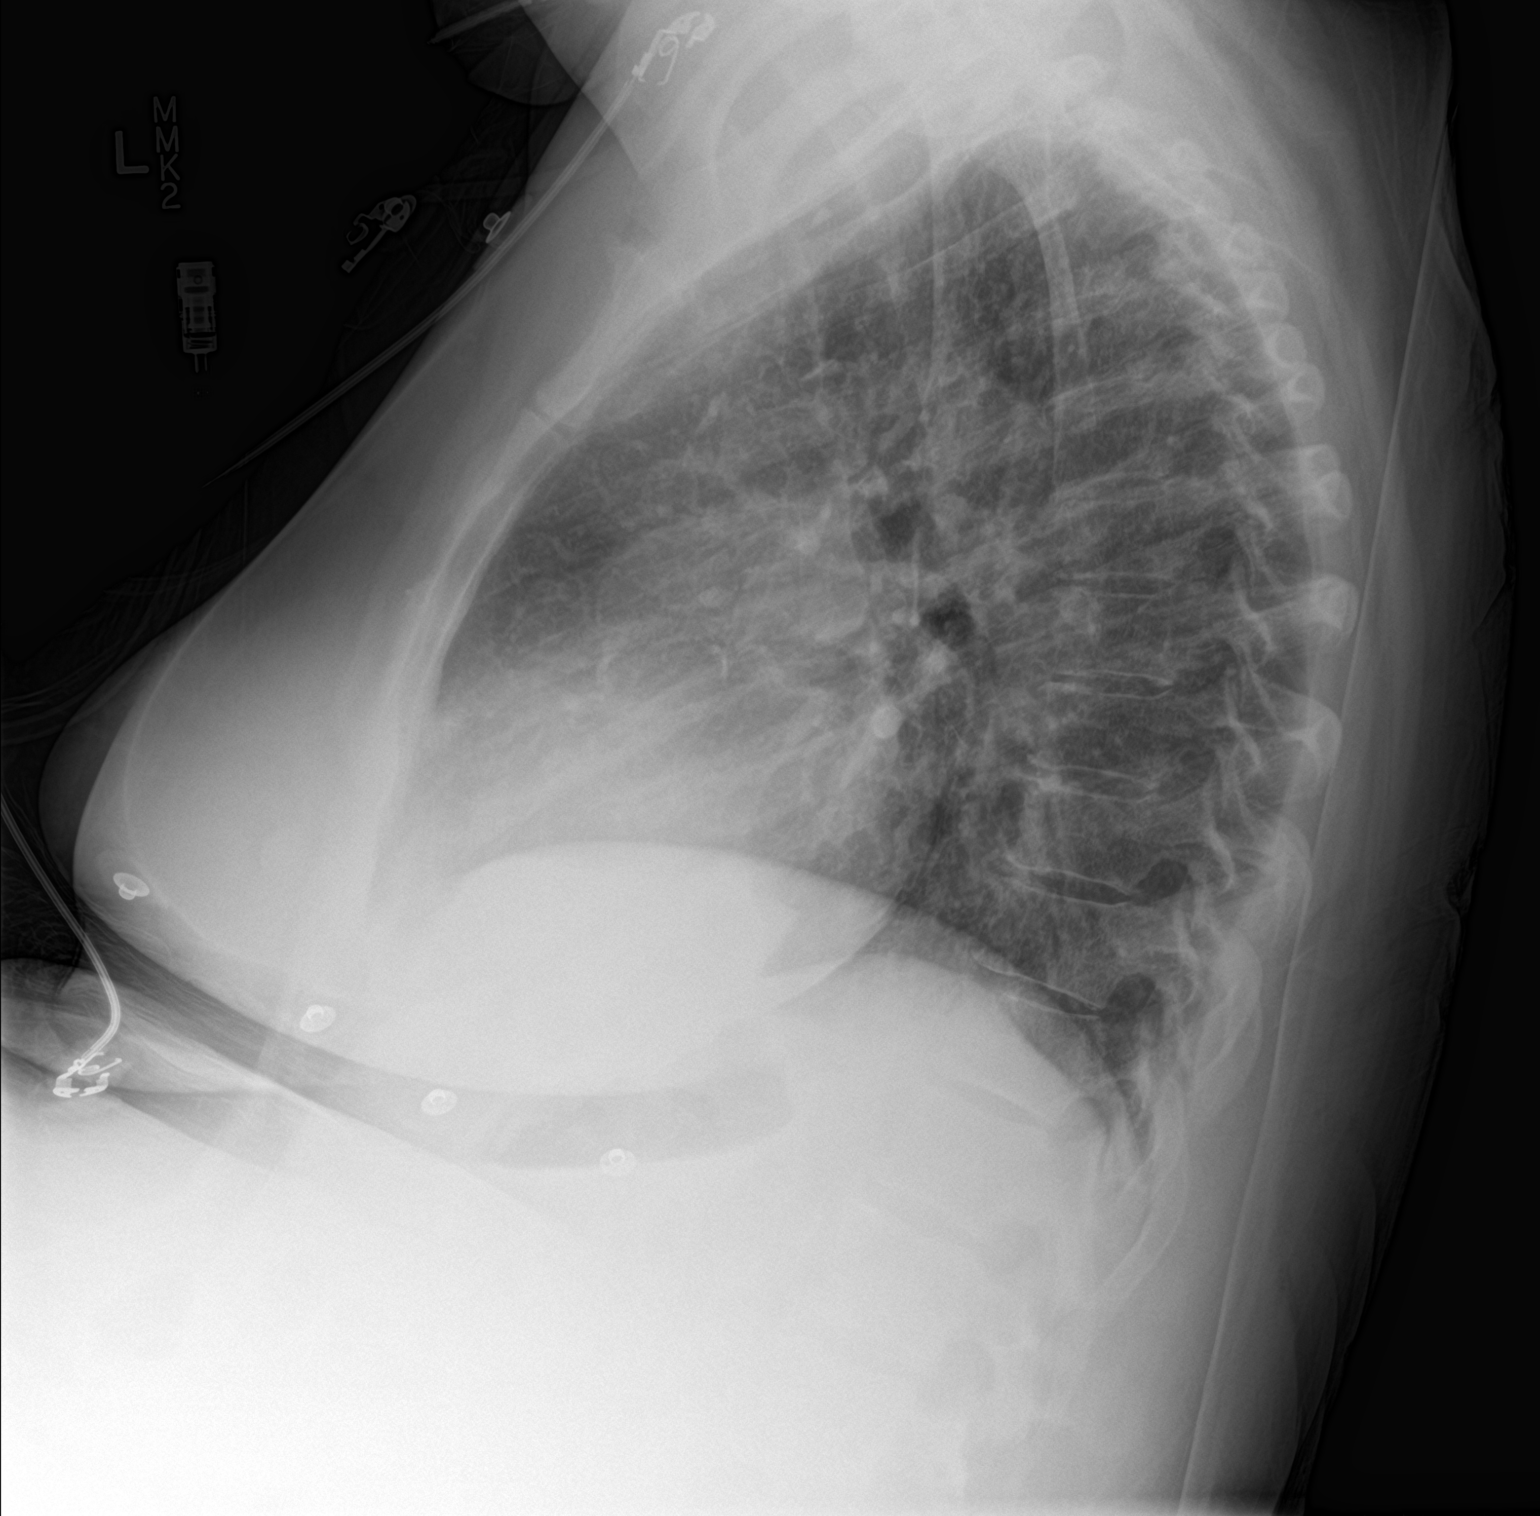

[chest ap]
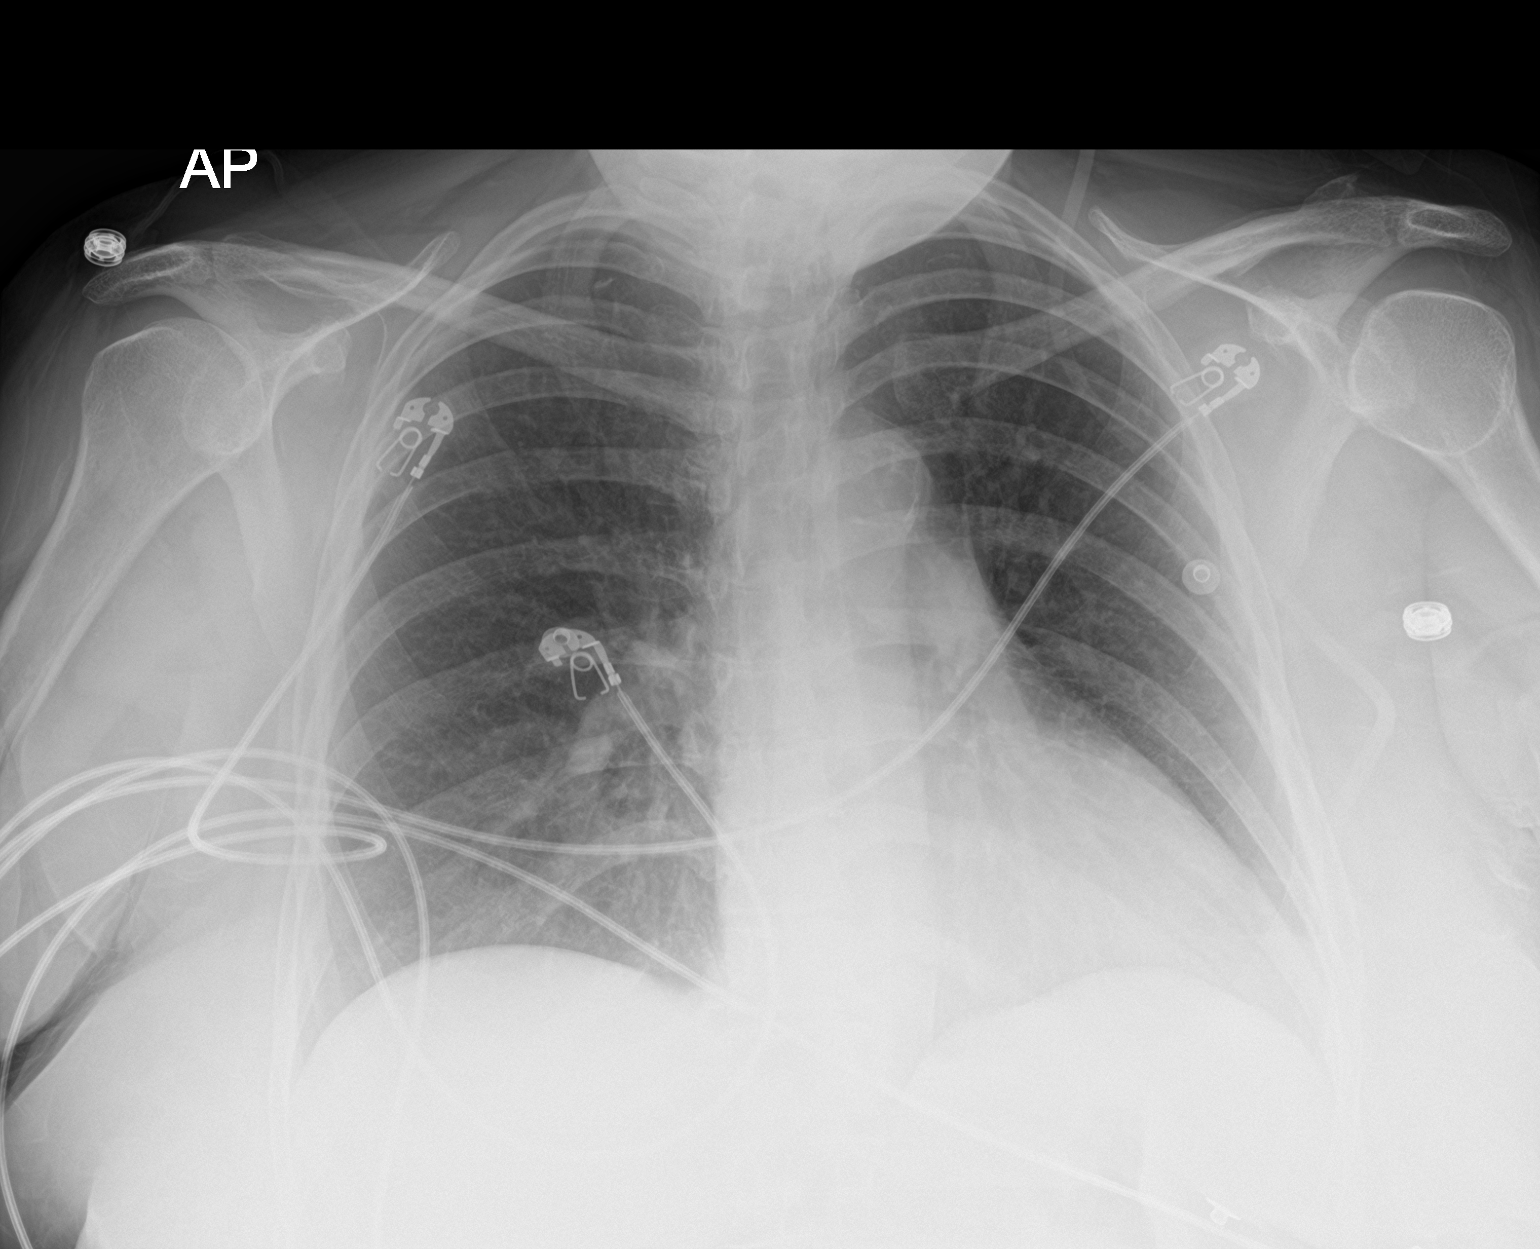

[2 of 2 positions shown; findings below may reference images not displayed]

FINDINGS: Lungs are clear.  No pleural effusion or pneumothorax.

The heart is normal in size.

Visualized osseous structures are within normal limits.
IMPRESSION: Normal chest radiographs.

## 2017-12-23 MED ORDER — SODIUM CHLORIDE 0.9 % IV BOLUS (SEPSIS): 1000.0000 mL | Freq: Once | INTRAVENOUS | Status: DC

## 2017-12-23 MED ORDER — SODIUM CHLORIDE 0.9 % IV SOLN
1.0000 g | Freq: Once | INTRAVENOUS | Status: AC
  Administered 2017-12-24: 1 g via INTRAVENOUS
  Filled 2017-12-23: qty 10

## 2017-12-23 MED ORDER — SODIUM CHLORIDE 0.9 % IV BOLUS (SEPSIS)
1000.0000 mL | Freq: Once | INTRAVENOUS | Status: AC
  Administered 2017-12-24: 1000 mL via INTRAVENOUS

## 2017-12-23 MED ORDER — SODIUM CHLORIDE 0.9 % IV BOLUS
1000.0000 mL | Freq: Once | INTRAVENOUS | Status: AC
  Administered 2017-12-23: 1000 mL via INTRAVENOUS

## 2017-12-23 MED ORDER — MORPHINE SULFATE (PF) 4 MG/ML IV SOLN
4.0000 mg | Freq: Once | INTRAVENOUS | Status: DC
Start: 2017-12-23 — End: 2017-12-25

## 2017-12-23 MED ORDER — ONDANSETRON HCL 4 MG/2ML IJ SOLN: 4.0000 mg | Freq: Once | INTRAMUSCULAR | Status: DC

## 2017-12-23 NOTE — ED Triage Notes (Signed)
Pt BIB [facility] for eval of dysuria x 2 months. Pt reports she called 911 today d/t weakness. States she has not been evaluated for dysuria- has hx of UTI states this feels the same. Pt reports 1 month ago she started passing blood "down there" but is unsure of where it is coming from. Pt reports she feels too weak to walk at this time. States she was note evaluated for above sx because "she can take a lot"

## 2017-12-23 NOTE — ED Provider Notes (Signed)
[facility] EMERGENCY DEPARTMENT Provider Note   CSN: [id number] Arrival date & time: 12/23/17  2113     History   Chief Complaint Chief Complaint  Patient presents with  . Vaginal Bleeding  . Urinary Tract Infection    HPI Pamela Carney is a 52 y.o. female.  Pt presents to the ED today with dysuria.  Pt said sx have been going on for 2 months.  She said she's been losing blood in her urine.  She has been very weak.  No fever.  EMS gave 1L NS.     Past Medical History:  Diagnosis Date  . Abnormal Pap smear   . Anxiety   . Atrial fibrillation ([place])   . Bipolar 1 disorder ([place])   . Genital warts    Hx of genital  . Hypertension     Patient Active Problem List   Diagnosis Date Noted  . Acute lower UTI 12/24/2017  . Neuropathy involving both lower extremities 01/31/2014  . Perimenopausal 12/29/2012  . Family planning, IUD (intrauterine device) check/reinsertion/removal 12/28/2012  . Pain 12/27/2012    Past Surgical History:  Procedure Laterality Date  . CERVICAL CONE BIOPSY    . CESAREAN SECTION    . DILATION AND CURETTAGE OF UTERUS    . OVARIAN CYST REMOVAL    . UNILATERAL SALPINGECTOMY     Pt unsure of which fallopian tube was removed     OB History    Gravida  7   Para  3   Term      Preterm  3   AB  2   Living  3     SAB  1   TAB      Ectopic  1   Multiple      Live Births  5            Home Medications    Prior to Admission medications   Medication Sig Start Date End Date Taking? Authorizing Provider  ibuprofen (ADVIL,MOTRIN) 200 MG tablet Take 1,000 mg by mouth every 4 (four) hours as needed (FOR PAIN).    Yes [provider]  Multiple Vitamins-Minerals (ONE-A-DAY WOMENS 50+ ADVANTAGE) TABS Take 1 tablet by mouth daily.   Yes [provider]  ALPRAZolam Duanne Moron) 0.5 MG tablet Take 0.5 mg by mouth 4 (four) times daily.    [provider]  bisoprolol-hydrochlorothiazide (ZIAC)  10-6.25 MG per tablet Take 1 tablet by mouth daily.    [provider]  butalbital-acetaminophen-caffeine (FIORICET WITH CODEINE) 50-325-40-30 MG per capsule Take 2 capsules by mouth 3 (three) times daily.    [provider]  divalproex (DEPAKOTE ER) 500 MG 24 hr tablet Take 2,000 mg by mouth daily.    [provider]  fluconazole (DIFLUCAN) 150 MG tablet Take 1 tablet (150 mg total) by mouth daily. Patient not taking: Reported on 12/23/2017 08/20/13   Fransico Meadow, PA-C  HYDROcodone-acetaminophen (NORCO/VICODIN) 5-325 MG per tablet Take 2 tablets by mouth every 4 (four) hours as needed. Patient not taking: Reported on 12/23/2017 08/20/13   Fransico Meadow, PA-C  ibuprofen (ADVIL,MOTRIN) 800 MG tablet Take 1 tablet (800 mg total) by mouth every 8 (eight) hours as needed for pain. Patient not taking: Reported on 12/23/2017 12/27/12   Shelly Bombard, MD  lamoTRIgine (LAMICTAL) 100 MG tablet Take 100 mg by mouth daily.    [provider]  losartan (COZAAR) 25 MG tablet Take 25 mg by mouth daily.  [provider]    Family History Family History  Problem Relation Age of Onset  . Diabetes Paternal Grandfather   . COPD Paternal Grandfather   . Heart disease Paternal Grandfather   . Cancer Paternal Grandmother   . Heart disease Paternal Grandmother   . Cancer Father   . Hypertension Father   . Heart attack Father   . Heart failure Mother   . Diabetes Mother   . Heart disease Mother   . Hyperlipidemia Mother   . Hypertension Mother   . Heart attack Mother   . Peripheral vascular disease Mother   . Hypertension Sister   . Heart attack Sister     Social History Social History   Tobacco Use  . Smoking status: Current Every Day Smoker    Packs/day: 1.00    Years: 32.00    Pack years: 32.00    Types: Cigarettes  . Smokeless tobacco: Never Used  Substance Use Topics  . Alcohol use: No    Alcohol/week: 0.0 standard drinks  . Drug use: No      Allergies   Sulfa antibiotics; Latex; and Tape   Review of Systems Review of Systems  Genitourinary: Positive for dysuria and hematuria.  All other systems reviewed and are negative.    Physical Exam Updated Vital Signs BP 103/79   Pulse 76   Temp 98.4 F (36.9 C) (Oral)   Resp 17   Ht 5\' 3"  (1.6 m)   Wt 124.7 kg   SpO2 100%   BMI 48.71 kg/m   Physical Exam  Constitutional: She is oriented to person, place, and time. She appears well-developed and well-nourished.  HENT:  Head: Normocephalic and atraumatic.  Right Ear: External ear normal.  Left Ear: External ear normal.  Nose: Nose normal.  Mouth/Throat: Oropharynx is clear and moist.  Eyes: Pupils are equal, round, and reactive to light. Conjunctivae and EOM are normal.  Neck: Normal range of motion. Neck supple.  Cardiovascular: Normal rate, regular rhythm, normal heart sounds and intact distal pulses.  Pulmonary/Chest: Effort normal and breath sounds normal.  Abdominal: Soft. Bowel sounds are normal.  Musculoskeletal: Normal range of motion.  Neurological: She is alert and oriented to person, place, and time.  Skin: Skin is warm and dry. Capillary refill takes less than 2 seconds.  Psychiatric: She has a normal mood and affect. Her behavior is normal. Judgment and thought content normal.  Nursing note and vitals reviewed.    ED Treatments / Results  Labs (all labs ordered are listed, but only abnormal results are displayed) Labs Reviewed  COMPREHENSIVE METABOLIC PANEL - Abnormal; Notable for the following components:      Result Value   Sodium 130 (*)    CO2 <7 (*)    Glucose, Bld 129 (*)    BUN 84 (*)    Creatinine, Ser 5.15 (*)    Calcium 8.0 (*)    Albumin 3.1 (*)    Total Bilirubin 1.5 (*)    GFR calc non Af Amer 9 (*)    GFR calc Af Amer 10 (*)    All other components within normal limits  CBC WITH DIFFERENTIAL/PLATELET - Abnormal; Notable for the following components:   WBC 20.2 (*)     Hemoglobin 15.2 (*)    RDW 15.7 (*)    Neutro Abs 17.3 (*)    Abs Immature Granulocytes 0.5 (*)    All other components within normal limits  PROTIME-INR - Abnormal; Notable for the following  components:   Prothrombin Time 16.9 (*)    All other components within normal limits  URINALYSIS, ROUTINE W REFLEX MICROSCOPIC - Abnormal; Notable for the following components:   APPearance TURBID (*)    Glucose, UA 50 (*)    Hgb urine dipstick LARGE (*)    Protein, ur 100 (*)    Leukocytes, UA LARGE (*)    RBC / HPF >50 (*)    WBC, UA >50 (*)    Bacteria, UA MANY (*)    All other components within normal limits  CULTURE, BLOOD (ROUTINE X 2)  CULTURE, BLOOD (ROUTINE X 2)  URINE CULTURE  CULTURE, BLOOD (ROUTINE X 2)  BLOOD GAS, ARTERIAL  I-STAT CG4 LACTIC ACID, ED  I-STAT BETA HCG BLOOD, ED ([facility], [facility], [facility] ONLY)  I-STAT CG4 LACTIC ACID, ED  I-STAT ARTERIAL BLOOD GAS, ED    EKG None  Radiology Dg Chest 2 View  Result Date: 12/23/2017 CLINICAL DATA:  Dysuria, weakness EXAM: CHEST - 2 VIEW COMPARISON:  None. FINDINGS: Lungs are clear.  No pleural effusion or pneumothorax. The heart is normal in size. Visualized osseous structures are within normal limits. IMPRESSION: Normal chest radiographs. Electronically Signed   By: Julian Hy M.D.   On: 12/23/2017 22:06   Ct Renal Stone Study  Result Date: 12/23/2017 CLINICAL DATA:  Dysuria, weakness EXAM: CT ABDOMEN AND PELVIS WITHOUT CONTRAST TECHNIQUE: Multidetector CT imaging of the abdomen and pelvis was performed following the standard protocol without IV contrast. COMPARISON:  12/18/2013 FINDINGS: Lower chest: Lung bases are clear. Hepatobiliary: Mild hepatic steatosis. Layering tiny gallstones (series 3/image 29), without associated inflammatory changes. No intrahepatic or extrahepatic ductal dilatation. Pancreas: Within normal limits. Spleen: Within normal limits. Adrenals/Urinary Tract: 2.9 cm right adrenal adenoma (series 3/image 19),  previously 2.2 cm. Left adrenal gland is within normal limits. Right kidney is notable for a 5 mm nonobstructing right lower pole renal calculus (series 3/image 41). Vascular calcifications. No hydronephrosis. 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 Layering calcifications in a left lower pole calyceal diverticulum (series 3/image 39). No hydronephrosis. Bladder is notable for nondependent gas. Stomach/Bowel: Stomach is within normal limits. No evidence of bowel obstruction. Normal appendix (series 3/image 58). Sigmoid diverticulosis, without evidence of diverticulitis. Vascular/Lymphatic: No evidence of abdominal aortic aneurysm. Prominent para-aortic nodes measuring up to 10 mm short axis (series 3/image 34), at the upper limits of normal. Reproductive: Uterine IUD, mildly obliqued, with the right arm possibly embedded in the myometrium (series 3/image 69). Bilateral ovaries are unremarkable. Other: No abdominopelvic ascites. Tiny fat containing periumbilical hernia (series 3/image 62). Musculoskeletal: Mild degenerative changes of the visualized thoracolumbar spine. 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 No hydronephrosis. Nondependent gas in the bladder, correlate for cystitis. No evidence of bowel obstruction. Normal appendix. Sigmoid diverticulosis, without evidence of diverticulitis. Cholelithiasis, without associated inflammatory changes. Uterine IUD may be mildly obliqued, with its right arm possibly embedded in the myometrium. Electronically Signed   By: Julian Hy M.D.   On: 12/23/2017 23:57    Procedures Procedures (including critical care time)  Medications Ordered in ED Medications  morphine 4 MG/ML injection 4 mg (0 mg Intravenous Hold 12/23/17 2234)  ondansetron  (ZOFRAN) injection 4 mg (has no administration in time range)  cefTRIAXone (ROCEPHIN) 1 g in sodium chloride 0.9 %  100 mL IVPB (1 g Intravenous New Bag/Given 12/24/17 0002)  sodium chloride 0.9 % bolus 1,000 mL (1,000 mLs Intravenous New Bag/Given 12/24/17 0001)    And  sodium chloride 0.9 % bolus 1,000 mL (1,000 mLs Intravenous New Bag/Given 12/24/17 0004)  sodium bicarbonate 150 mEq in sterile water 1,000 mL infusion (has no administration in time range)  sodium chloride 0.9 % bolus 1,000 mL (0 mLs Intravenous Stopped 12/23/17 2316)     Initial Impression / Assessment and Plan / ED Course  I have reviewed the triage vital signs and the nursing notes.  Pertinent labs & imaging results that were available during my care of the patient were reviewed by me and considered in my medical decision making (see chart for details).  CRITICAL CARE Performed by: Isla Pence   Total critical care time: 30 minutes  Critical care time was exclusive of separately billable procedures and treating other patients.  Critical care was necessary to treat or prevent imminent or life-threatening deterioration.  Critical care was time spent personally by me on the following activities: development of treatment plan with patient and/or surrogate as well as nursing, discussions with consultants, evaluation of patient's response to treatment, examination of patient, obtaining history from patient or surrogate, ordering and performing treatments and interventions, ordering and review of laboratory studies, ordering and review of radiographic studies, pulse oximetry and re-evaluation of patient's condition.     Code sepsis called.  Pt given sepsis fluids and IV rocephin.  BP improved, but is still a little low.  CT scan does not show any obstructive stones.  Pt does have aki as well.  Pt requested foley, so this is placed temporarily.  Etiology of low bicarb is unclear, abg ordered.  Dr. Alcario Drought asked to do  bicarb drip, so that was ordered.  Pt d/w Dr. Alcario Drought (triad) who will admit.     Final Clinical Impressions(s) / ED Diagnoses   Final diagnoses:  AKI (acute kidney injury) ([place])  Acute cystitis with hematuria  Sepsis with acute renal failure without septic shock, due to unspecified organism, unspecified acute renal failure type [facility])    ED Discharge Orders    None       Isla Pence, MD 12/24/17 0017

## 2017-12-24 ENCOUNTER — Encounter (HOSPITAL_COMMUNITY): Payer: Self-pay | Admitting: *Deleted

## 2017-12-24 ENCOUNTER — Other Ambulatory Visit: Payer: Self-pay

## 2017-12-24 DIAGNOSIS — N390 Urinary tract infection, site not specified: Secondary | ICD-10-CM

## 2017-12-24 DIAGNOSIS — R531 Weakness: Secondary | ICD-10-CM

## 2017-12-24 DIAGNOSIS — N183 Chronic kidney disease, stage 3 (moderate): Secondary | ICD-10-CM | POA: Diagnosis present

## 2017-12-24 DIAGNOSIS — E872 Acidosis, unspecified: Secondary | ICD-10-CM | POA: Diagnosis present

## 2017-12-24 DIAGNOSIS — Y768 Miscellaneous obstetric and gynecological devices associated with adverse incidents, not elsewhere classified: Secondary | ICD-10-CM | POA: Diagnosis present

## 2017-12-24 DIAGNOSIS — E876 Hypokalemia: Secondary | ICD-10-CM

## 2017-12-24 DIAGNOSIS — T465X5A Adverse effect of other antihypertensive drugs, initial encounter: Secondary | ICD-10-CM | POA: Diagnosis present

## 2017-12-24 DIAGNOSIS — R6521 Severe sepsis with septic shock: Secondary | ICD-10-CM | POA: Diagnosis not present

## 2017-12-24 DIAGNOSIS — N119 Chronic tubulo-interstitial nephritis, unspecified: Secondary | ICD-10-CM | POA: Diagnosis present

## 2017-12-24 DIAGNOSIS — Z90721 Acquired absence of ovaries, unilateral: Secondary | ICD-10-CM | POA: Diagnosis not present

## 2017-12-24 DIAGNOSIS — A419 Sepsis, unspecified organism: Secondary | ICD-10-CM

## 2017-12-24 DIAGNOSIS — N200 Calculus of kidney: Secondary | ICD-10-CM | POA: Diagnosis present

## 2017-12-24 DIAGNOSIS — A4151 Sepsis due to Escherichia coli [E. coli]: Secondary | ICD-10-CM | POA: Diagnosis present

## 2017-12-24 DIAGNOSIS — B370 Candidal stomatitis: Secondary | ICD-10-CM | POA: Diagnosis present

## 2017-12-24 DIAGNOSIS — Z6841 Body Mass Index (BMI) 40.0 and over, adult: Secondary | ICD-10-CM | POA: Diagnosis not present

## 2017-12-24 DIAGNOSIS — G629 Polyneuropathy, unspecified: Secondary | ICD-10-CM | POA: Diagnosis present

## 2017-12-24 DIAGNOSIS — I10 Essential (primary) hypertension: Secondary | ICD-10-CM | POA: Diagnosis not present

## 2017-12-24 DIAGNOSIS — R6520 Severe sepsis without septic shock: Secondary | ICD-10-CM

## 2017-12-24 DIAGNOSIS — F419 Anxiety disorder, unspecified: Secondary | ICD-10-CM | POA: Diagnosis present

## 2017-12-24 DIAGNOSIS — N179 Acute kidney failure, unspecified: Secondary | ICD-10-CM | POA: Diagnosis present

## 2017-12-24 DIAGNOSIS — Z882 Allergy status to sulfonamides status: Secondary | ICD-10-CM | POA: Diagnosis not present

## 2017-12-24 DIAGNOSIS — G43909 Migraine, unspecified, not intractable, without status migrainosus: Secondary | ICD-10-CM | POA: Diagnosis present

## 2017-12-24 DIAGNOSIS — F319 Bipolar disorder, unspecified: Secondary | ICD-10-CM | POA: Diagnosis present

## 2017-12-24 DIAGNOSIS — Z91040 Latex allergy status: Secondary | ICD-10-CM | POA: Diagnosis not present

## 2017-12-24 DIAGNOSIS — N3001 Acute cystitis with hematuria: Secondary | ICD-10-CM

## 2017-12-24 DIAGNOSIS — T8389XA Other specified complication of genitourinary prosthetic devices, implants and grafts, initial encounter: Secondary | ICD-10-CM | POA: Diagnosis present

## 2017-12-24 DIAGNOSIS — Z91048 Other nonmedicinal substance allergy status: Secondary | ICD-10-CM | POA: Diagnosis not present

## 2017-12-24 DIAGNOSIS — X58XXXA Exposure to other specified factors, initial encounter: Secondary | ICD-10-CM | POA: Diagnosis present

## 2017-12-24 DIAGNOSIS — T502X5A Adverse effect of carbonic-anhydrase inhibitors, benzothiadiazides and other diuretics, initial encounter: Secondary | ICD-10-CM | POA: Diagnosis present

## 2017-12-24 DIAGNOSIS — I4891 Unspecified atrial fibrillation: Secondary | ICD-10-CM | POA: Diagnosis present

## 2017-12-24 DIAGNOSIS — I129 Hypertensive chronic kidney disease with stage 1 through stage 4 chronic kidney disease, or unspecified chronic kidney disease: Secondary | ICD-10-CM | POA: Diagnosis present

## 2017-12-24 LAB — BASIC METABOLIC PANEL
ANION GAP: 19 — AB (ref 5–15)
Anion gap: 25 — ABNORMAL HIGH (ref 5–15)
Anion gap: 21 — ABNORMAL HIGH (ref 5–15)
Anion gap: 19 — ABNORMAL HIGH (ref 5–15)
BUN: 74 mg/dL — AB (ref 6–20)
BUN: 76 mg/dL — AB (ref 6–20)
BUN: 74 mg/dL — AB (ref 6–20)
BUN: 70 mg/dL — AB (ref 6–20)
CALCIUM: 6.8 mg/dL — AB (ref 8.9–10.3)
CALCIUM: 6.8 mg/dL — AB (ref 8.9–10.3)
CALCIUM: 6 mg/dL — AB (ref 8.9–10.3)
CO2: 8 mmol/L — ABNORMAL LOW (ref 22–32)
CO2: 9 mmol/L — ABNORMAL LOW (ref 22–32)
CO2: 8 mmol/L — ABNORMAL LOW (ref 22–32)
CO2: 9 mmol/L — ABNORMAL LOW (ref 22–32)
CREATININE: 3.6 mg/dL — AB (ref 0.44–1.00)
CREATININE: 3.3 mg/dL — AB (ref 0.44–1.00)
Calcium: 6.6 mg/dL — ABNORMAL LOW (ref 8.9–10.3)
Chloride: 110 mmol/L (ref 98–111)
Chloride: 104 mmol/L (ref 98–111)
Chloride: 108 mmol/L (ref 98–111)
Chloride: 112 mmol/L — ABNORMAL HIGH (ref 98–111)
Creatinine, Ser: 3.89 mg/dL — ABNORMAL HIGH (ref 0.44–1.00)
Creatinine, Ser: 3.92 mg/dL — ABNORMAL HIGH (ref 0.44–1.00)
GFR calc Af Amer: 14 mL/min — ABNORMAL LOW (ref >60)
GFR calc Af Amer: 14 mL/min — ABNORMAL LOW (ref >60)
GFR calc Af Amer: 16 mL/min — ABNORMAL LOW (ref >60)
GFR calc Af Amer: 18 mL/min — ABNORMAL LOW (ref >60)
GFR calc non Af Amer: 12 mL/min — ABNORMAL LOW (ref >60)
GFR calc non Af Amer: 12 mL/min — ABNORMAL LOW (ref >60)
GFR, EST NON AFRICAN AMERICAN: 14 mL/min — AB (ref >60)
GFR, EST NON AFRICAN AMERICAN: 15 mL/min — AB (ref >60)
GLUCOSE: 74 mg/dL (ref 70–99)
GLUCOSE: 61 mg/dL — AB (ref 70–99)
Glucose, Bld: 83 mg/dL (ref 70–99)
Glucose, Bld: 79 mg/dL (ref 70–99)
POTASSIUM: 2.3 mmol/L — AB (ref 3.5–5.1)
POTASSIUM: 2.5 mmol/L — AB (ref 3.5–5.1)
POTASSIUM: 2.7 mmol/L — AB (ref 3.5–5.1)
POTASSIUM: 2.7 mmol/L — AB (ref 3.5–5.1)
SODIUM: 137 mmol/L (ref 135–145)
SODIUM: 138 mmol/L (ref 135–145)
SODIUM: 137 mmol/L (ref 135–145)
SODIUM: 140 mmol/L (ref 135–145)

## 2017-12-24 LAB — BLOOD GAS, ARTERIAL
ACID-BASE DEFICIT: 26.7 mmol/L — AB (ref 0.0–2.0)
Acid-base deficit: 19.3 mmol/L — ABNORMAL HIGH (ref 0.0–2.0)
BICARBONATE: 7.4 mmol/L — AB (ref 20.0–28.0)
Bicarbonate: 2.4 mmol/L — ABNORMAL LOW (ref 20.0–28.0)
Drawn by: 406621
FIO2: 21
O2 SAT: 98.3 %
O2 SAT: 97.3 %
PATIENT TEMPERATURE: 98.6
PATIENT TEMPERATURE: 98.6
PO2 ART: 104 mmHg (ref 83.0–108.0)
pCO2 arterial: 19.6 mmHg — CL (ref 32.0–48.0)
pH, Arterial: 7.066 — CL (ref 7.350–7.450)
pH, Arterial: 7.201 — ABNORMAL LOW (ref 7.350–7.450)
pO2, Arterial: 196 mmHg — ABNORMAL HIGH (ref 83.0–108.0)

## 2017-12-24 LAB — BETA-HYDROXYBUTYRIC ACID: Beta-Hydroxybutyric Acid: 1.16 mmol/L — ABNORMAL HIGH (ref 0.05–0.27)

## 2017-12-24 LAB — PROCALCITONIN: Procalcitonin: 0.24 ng/mL

## 2017-12-24 LAB — GLUCOSE, CAPILLARY
GLUCOSE-CAPILLARY: 153 mg/dL — AB (ref 70–99)
GLUCOSE-CAPILLARY: 66 mg/dL — AB (ref 70–99)
Glucose-Capillary: 90 mg/dL (ref 70–99)
Glucose-Capillary: 108 mg/dL — ABNORMAL HIGH (ref 70–99)

## 2017-12-24 LAB — VOLATILES,BLD-ACETONE,ETHANOL,ISOPROP,METHANOL
Acetone, blood: NEGATIVE % (ref 0.000–0.010)
Ethanol, blood: NEGATIVE % (ref 0.000–0.010)
ISOPROPANOL, BLOOD: NEGATIVE % (ref 0.000–0.010)
METHANOL, BLOOD: NEGATIVE % (ref 0.000–0.010)

## 2017-12-24 LAB — CBC
HEMATOCRIT: 36.3 % (ref 36.0–46.0)
HEMOGLOBIN: 12.3 g/dL (ref 12.0–15.0)
MCH: 30.9 pg (ref 26.0–34.0)
MCHC: 33.9 g/dL (ref 30.0–36.0)
MCV: 91.2 fL (ref 78.0–100.0)
Platelets: 304 10*3/uL (ref 150–400)
RBC: 3.98 MIL/uL (ref 3.87–5.11)
RDW: 15.2 % (ref 11.5–15.5)
WBC: 17.2 10*3/uL — ABNORMAL HIGH (ref 4.0–10.5)

## 2017-12-24 LAB — MRSA PCR SCREENING: MRSA by PCR: NEGATIVE

## 2017-12-24 LAB — CORTISOL: CORTISOL PLASMA: 17.8 ug/dL

## 2017-12-24 LAB — T4, FREE: FREE T4: 0.6 ng/dL — AB (ref 0.82–1.77)

## 2017-12-24 LAB — MAGNESIUM: Magnesium: 2 mg/dL (ref 1.7–2.4)

## 2017-12-24 LAB — SALICYLATE LEVEL: Salicylate Lvl: <7 mg/dL (ref 2.8–30.0)

## 2017-12-24 LAB — HIV ANTIBODY (ROUTINE TESTING W REFLEX): HIV Screen 4th Generation wRfx: NONREACTIVE

## 2017-12-24 LAB — LACTIC ACID, PLASMA: Lactic Acid, Venous: 0.8 mmol/L (ref 0.5–1.9)

## 2017-12-24 LAB — TSH: TSH: 0.21 u[IU]/mL — ABNORMAL LOW (ref 0.350–4.500)

## 2017-12-24 MED ORDER — NYSTATIN 100000 UNIT/ML MT SUSP
5.0000 mL | Freq: Four times a day (QID) | OROMUCOSAL | Status: DC
  Administered 2017-12-24 – 2017-12-29 (×21): 500000 [IU] via ORAL
  Filled 2017-12-24 (×24): qty 5

## 2017-12-24 MED ORDER — SODIUM CHLORIDE 0.9 % IV BOLUS
500.0000 mL | Freq: Once | INTRAVENOUS | Status: AC
  Administered 2017-12-24: 500 mL via INTRAVENOUS

## 2017-12-24 MED ORDER — SODIUM CHLORIDE 0.9 % IV BOLUS
1000.0000 mL | Freq: Once | INTRAVENOUS | Status: AC
  Administered 2017-12-24: 1000 mL via INTRAVENOUS

## 2017-12-24 MED ORDER — SODIUM CHLORIDE 0.9 % IV SOLN: 1.0000 g | INTRAVENOUS | Status: DC

## 2017-12-24 MED ORDER — ONDANSETRON HCL 4 MG/2ML IJ SOLN
4.0000 mg | Freq: Four times a day (QID) | INTRAMUSCULAR | Status: DC | PRN
  Administered 2017-12-24 – 2017-12-26 (×2): 4 mg via INTRAVENOUS
  Filled 2017-12-24 (×2): qty 2

## 2017-12-24 MED ORDER — DEXTROSE 50 % IV SOLN: 25.0000 mL | Freq: Once | INTRAVENOUS | Status: AC

## 2017-12-24 MED ORDER — DEXTROSE 50 % IV SOLN
INTRAVENOUS | Status: AC
  Administered 2017-12-24: 25 mL
  Filled 2017-12-24: qty 50

## 2017-12-24 MED ORDER — SODIUM CHLORIDE 0.9 % IV SOLN
1.0000 g | INTRAVENOUS | Status: DC
  Administered 2017-12-24 – 2017-12-25 (×2): 1 g via INTRAVENOUS
  Filled 2017-12-24 (×2): qty 1

## 2017-12-24 MED ORDER — STERILE WATER FOR INJECTION IV SOLN
INTRAVENOUS | Status: DC
  Administered 2017-12-24 (×3): via INTRAVENOUS
  Filled 2017-12-24 (×4): qty 850

## 2017-12-24 MED ORDER — CIPROFLOXACIN IN D5W 400 MG/200ML IV SOLN
400.0000 mg | INTRAVENOUS | Status: AC
  Administered 2017-12-25 (×2): 400 mg via INTRAVENOUS
  Filled 2017-12-24: qty 200

## 2017-12-24 MED ORDER — SODIUM CHLORIDE 0.9 % IV SOLN
10.0000 mg | INTRAVENOUS | Status: DC
  Administered 2017-12-24: 10 mg via INTRAVENOUS
  Filled 2017-12-24: qty 1

## 2017-12-24 MED ORDER — ONDANSETRON HCL 4 MG PO TABS: 4.0000 mg | ORAL_TABLET | Freq: Four times a day (QID) | ORAL | Status: DC | PRN

## 2017-12-24 MED ORDER — ACETAMINOPHEN 650 MG RE SUPP: 650.0000 mg | Freq: Four times a day (QID) | RECTAL | Status: DC | PRN

## 2017-12-24 MED ORDER — STERILE WATER FOR INJECTION IV SOLN: INTRAVENOUS | Status: DC

## 2017-12-24 MED ORDER — HEPARIN SODIUM (PORCINE) 5000 UNIT/ML IJ SOLN
5000.0000 [IU] | Freq: Three times a day (TID) | INTRAMUSCULAR | Status: DC
  Administered 2017-12-24: 5000 [IU] via SUBCUTANEOUS

## 2017-12-24 MED ORDER — ORAL CARE MOUTH RINSE
15.0000 mL | Freq: Two times a day (BID) | OROMUCOSAL | Status: DC
  Administered 2017-12-25 – 2017-12-26 (×2): 15 mL via OROMUCOSAL

## 2017-12-24 MED ORDER — POTASSIUM CHLORIDE 10 MEQ/100ML IV SOLN: 10.0000 meq | INTRAVENOUS | Status: DC

## 2017-12-24 MED ORDER — SODIUM BICARBONATE 8.4 % IV SOLN
INTRAVENOUS | Status: DC
  Administered 2017-12-24 – 2017-12-25 (×2): via INTRAVENOUS
  Filled 2017-12-24 (×3): qty 150

## 2017-12-24 MED ORDER — POTASSIUM CHLORIDE 10 MEQ/100ML IV SOLN
10.0000 meq | Freq: Once | INTRAVENOUS | Status: AC
  Administered 2017-12-24: 10 meq via INTRAVENOUS
  Filled 2017-12-24: qty 100

## 2017-12-24 MED ORDER — HYDROCORTISONE NA SUCCINATE PF 100 MG IJ SOLR
50.0000 mg | Freq: Four times a day (QID) | INTRAMUSCULAR | Status: DC
  Administered 2017-12-24 – 2017-12-25 (×4): 50 mg via INTRAVENOUS
  Filled 2017-12-24 (×5): qty 2

## 2017-12-24 MED ORDER — INSULIN ASPART 100 UNIT/ML ~~LOC~~ SOLN
1.0000 [IU] | SUBCUTANEOUS | Status: DC
  Administered 2017-12-25 (×2): 1 [IU] via SUBCUTANEOUS
  Administered 2017-12-25: 2 [IU] via SUBCUTANEOUS

## 2017-12-24 MED ORDER — DEXTROSE 50 % IV SOLN
25.0000 mL | Freq: Once | INTRAVENOUS | Status: AC
  Administered 2017-12-24: 25 mL via INTRAVENOUS

## 2017-12-24 MED ORDER — ACETAMINOPHEN 325 MG PO TABS: 650.0000 mg | ORAL_TABLET | Freq: Four times a day (QID) | ORAL | Status: DC | PRN

## 2017-12-24 MED ORDER — POTASSIUM CHLORIDE CRYS ER 20 MEQ PO TBCR
40.0000 meq | EXTENDED_RELEASE_TABLET | ORAL | Status: AC
  Administered 2017-12-24 (×2): 40 meq via ORAL
  Filled 2017-12-24 (×2): qty 2

## 2017-12-24 MED ORDER — POTASSIUM CHLORIDE 10 MEQ/100ML IV SOLN
10.0000 meq | INTRAVENOUS | Status: AC
  Administered 2017-12-24 (×5): 10 meq via INTRAVENOUS
  Filled 2017-12-24 (×5): qty 100

## 2017-12-24 MED ORDER — SODIUM CHLORIDE 0.9 % IV SOLN
2.0000 g | INTRAVENOUS | Status: DC
  Filled 2017-12-24: qty 20

## 2017-12-24 MED ORDER — CALCIUM CARBONATE ANTACID 500 MG PO CHEW
800.0000 mg | CHEWABLE_TABLET | Freq: Three times a day (TID) | ORAL | Status: AC
  Administered 2017-12-24 – 2017-12-26 (×6): 800 mg via ORAL
  Filled 2017-12-24 (×6): qty 4

## 2017-12-24 MED ORDER — SODIUM BICARBONATE 8.4 % IV SOLN
50.0000 meq | Freq: Once | INTRAVENOUS | Status: AC
  Administered 2017-12-24: 50 meq via INTRAVENOUS
  Filled 2017-12-24: qty 50

## 2017-12-24 MED ORDER — CHLORHEXIDINE GLUCONATE 0.12 % MT SOLN
15.0000 mL | Freq: Two times a day (BID) | OROMUCOSAL | Status: DC
  Administered 2017-12-24 – 2017-12-27 (×4): 15 mL via OROMUCOSAL
  Filled 2017-12-24 (×3): qty 15

## 2017-12-24 NOTE — Progress Notes (Addendum)
Report given to [facility]. Patient to transfer to [phone]

## 2017-12-24 NOTE — Progress Notes (Signed)
   12/24/17 0857  Provider Notification  Provider Name/Title Dr. Sarajane Jews  Date Provider Notified 12/24/17  Time Provider Notified [phone]  Notification Type Page  Notification Reason Other (Comment);Change in status (K = 2.5, B/P = 78/52, N.O for bolus per renal. ABG Pending)  Response Other (Comment) (MD to eval)  Date of Provider Response 12/24/17  Time of Provider Response [phone]

## 2017-12-24 NOTE — Progress Notes (Signed)
Patient transferred to room 2M13.  Attempt to call spouse for notification, but the line is busy x 2. Will try again later. Dorthey Sawyer, RN

## 2017-12-24 NOTE — Progress Notes (Signed)
1448 Upon transfer to the floor PT pressure was 71/57 map of 64. Dr Nelda Marseille notified. No new orders placed. PT alert and oriented, closely monitoring.

## 2017-12-24 NOTE — Consult Note (Signed)
Reason for Consult: Acute Renal Failure, Left > Right Bilateral Renal Stones, Bacteruria.   Referring Physician: Murray Hodgkins MD  Pamela Carney is an 52 y.o. female.   HPI:   1 -  Acute Renal Failure  - Cr 5.1 up from baseline <1.5 by ER labs. Admits to supratheraputic NSAID use for migraines.   2 -  Left > Right Bilateral Renal Stones - RLP 61m non-obstrucing and left 139mupper infundibulum (some upper pole hydro), 3cm LLP partial staghorn by CT 12/2017 on eval abdominal pain and hematuria.   3 - Bacteruria - many bacteria on UA from ER 12/2017, some left upper pole focal obstruction from stone. WBC 20k. UCX 10/4 penidng. Placed on empiric rocephin.  4 - Hematuria -  New hematuria 2019. Large bilateral stones as per above.   PMH sig for biplor, migrain, morbid obesity. No PCP in years.   Today "ShHaneefahis seen in consultation for above.    Past Medical History:  Diagnosis Date  . Abnormal Pap smear   . Anxiety   . Atrial fibrillation ([place]  . Bipolar 1 disorder (HCG[place]  . Genital warts    Hx of genital  . Hypertension     Past Surgical History:  Procedure Laterality Date  . CERVICAL CONE BIOPSY    . CESAREAN SECTION    . DILATION AND CURETTAGE OF UTERUS    . OVARIAN CYST REMOVAL    . UNILATERAL SALPINGECTOMY     Pt unsure of which fallopian tube was removed    Family History  Problem Relation Age of Onset  . Diabetes Paternal Grandfather   . COPD Paternal Grandfather   . Heart disease Paternal Grandfather   . Cancer Paternal Grandmother   . Heart disease Paternal Grandmother   . Cancer Father   . Hypertension Father   . Heart attack Father   . Heart failure Mother   . Diabetes Mother   . Heart disease Mother   . Hyperlipidemia Mother   . Hypertension Mother   . Heart attack Mother   . Peripheral vascular disease Mother   . Hypertension Sister   . Heart attack Sister     Social History:  reports that she has been smoking cigarettes. She has a  32.00 pack-year smoking history. She has never used smokeless tobacco. She reports that she does not drink alcohol or use drugs.  Allergies:  Allergies  Allergen Reactions  . Sulfa Antibiotics Hives and Swelling    Swelling site not recalled  . Latex Rash  . Tape Rash    Not tolerated well    Medications: I have reviewed the patient's current medications.  Results for orders placed or performed during the hospital encounter of 12/23/17 (from the past 48 hour(s))  Comprehensive metabolic panel     Status: Abnormal   Collection Time: 12/23/17  9:23 PM  Result Value Ref Range   Sodium 130 (L) 135 - 145 mmol/L   Potassium 4.5 3.5 - 5.1 mmol/L    Comment: SPECIMEN HEMOLYZED. HEMOLYSIS MAY AFFECT INTEGRITY OF RESULTS.   Chloride 100 98 - 111 mmol/L   CO2 <7 (L) 22 - 32 mmol/L   Glucose, Bld 129 (H) 70 - 99 mg/dL   BUN 84 (H) 6 - 20 mg/dL   Creatinine, Ser 5.15 (H) 0.44 - 1.00 mg/dL   Calcium 8.0 (L) 8.9 - 10.3 mg/dL   Total Protein 6.6 6.5 - 8.1 g/dL   Albumin 3.1 (L) 3.5 - 5.0  g/dL   AST 19 15 - 41 U/L   ALT 17 0 - 44 U/L   Alkaline Phosphatase 89 38 - 126 U/L   Total Bilirubin 1.5 (H) 0.3 - 1.2 mg/dL   GFR calc non Af Amer 9 (L) >60 mL/min   GFR calc Af Amer 10 (L) >60 mL/min    Comment: (NOTE) The eGFR has been calculated using the CKD EPI equation. This calculation has not been validated in all clinical situations. eGFR's persistently <60 mL/min signify possible Chronic Kidney Disease.    Anion gap NOT CALCULATED 5 - 15    Comment: Performed at [facility] [address] [place], [place] [address]  CBC with Differential     Status: Abnormal   Collection Time: 12/23/17  9:23 PM  Result Value Ref Range   WBC 20.2 (H) 4.0 - 10.5 K/uL   RBC 4.88 3.87 - 5.11 MIL/uL   Hemoglobin 15.2 (H) 12.0 - 15.0 g/dL   HCT 45.9 36.0 - 46.0 %   MCV 94.1 78.0 - 100.0 fL   MCH 31.1 26.0 - 34.0 pg   MCHC 33.1 30.0 - 36.0 g/dL   RDW 15.7 (H) 11.5 - 15.5 %   Platelets 381 150 -  400 K/uL   Neutrophils Relative % 85 %   Neutro Abs 17.3 (H) 1.7 - 7.7 K/uL   Lymphocytes Relative 7 %   Lymphs Abs 1.3 0.7 - 4.0 K/uL   Monocytes Relative 4 %   Monocytes Absolute 0.8 0.1 - 1.0 K/uL   Eosinophils Relative 1 %   Eosinophils Absolute 0.1 0.0 - 0.7 K/uL   Basophils Relative 1 %   Basophils Absolute 0.1 0.0 - 0.1 K/uL   Immature Granulocytes 2 %   Abs Immature Granulocytes 0.5 (H) 0.0 - 0.1 K/uL    Comment: Performed at [facility] [address] [place], [place] [address]  Protime-INR     Status: Abnormal   Collection Time: 12/23/17  9:23 PM  Result Value Ref Range   Prothrombin Time 16.9 (H) 11.4 - 15.2 seconds   INR 1.38     Comment: Performed at [facility] [address], [place], [place] [address]  Urinalysis, Routine w reflex microscopic     Status: Abnormal   Collection Time: 12/23/17  9:45 PM  Result Value Ref Range   Color, Urine YELLOW YELLOW   APPearance TURBID (A) CLEAR   Specific Gravity, Urine 1.014 1.005 - 1.030   pH 5.0 5.0 - 8.0   Glucose, UA 50 (A) NEGATIVE mg/dL   Hgb urine dipstick LARGE (A) NEGATIVE   Bilirubin Urine NEGATIVE NEGATIVE   Ketones, ur NEGATIVE NEGATIVE mg/dL   Protein, ur 100 (A) NEGATIVE mg/dL   Nitrite NEGATIVE NEGATIVE   Leukocytes, UA LARGE (A) NEGATIVE   RBC / HPF >50 (H) 0 - 5 RBC/hpf   WBC, UA >50 (H) 0 - 5 WBC/hpf   Bacteria, UA MANY (A) NONE SEEN   WBC Clumps PRESENT    Mucus PRESENT     Comment: Performed at [facility] [address] [place], [place] [address]  Culture, blood (Routine x 2)     Status: None (Preliminary result)   Collection Time: 12/23/17  9:46 PM  Result Value Ref Range   Specimen Description BLOOD BLOOD RIGHT FOREARM    Special Requests      BOTTLES DRAWN AEROBIC AND ANAEROBIC Blood Culture results may not be optimal due to an  inadequate volume of blood received in culture bottles   Culture      NO GROWTH < 12 HOURS Performed at [facility] [address], [place], [place] [address]    Report Status PENDING   I-Stat CG4 Lactic Acid, ED     Status: None   Collection Time: 12/23/17  9:56 PM  Result Value Ref Range   Lactic Acid, Venous 0.73 0.5 - 1.9 mmol/L  Culture, blood (Routine x 2)     Status: None (Preliminary result)   Collection Time: 12/23/17 10:55 PM  Result Value Ref Range   Specimen Description BLOOD RIGHT HAND    Special Requests      BOTTLES DRAWN AEROBIC ONLY Blood Culture results may not be optimal due to an inadequate volume of blood received in culture bottles   Culture      NO GROWTH < 12 HOURS Performed at [facility] [address] [place], [place] [address]    Report Status PENDING   Blood gas, arterial     Status: Abnormal   Collection Time: 12/24/17  1:26 AM  Result Value Ref Range   pH, Arterial 7.066 (LL) 7.350 - 7.450    Comment: CRITICAL RESULT CALLED TO, READ BACK BY AND VERIFIED WITH: WHITLEY STONENAUER RRT AT 0136 BY TIM SNIDER RCP,RRT ON 12/24/2017    pCO2 arterial BELOW REPORTABLE RANGE 32.0 - 48.0 mmHg    Comment: CRITICAL RESULT CALLED TO, READ BACK BY AND VERIFIED WITH: WHITLEY STOCKENAUER RRT AT 0137 BY TIM SNIDER RCP,RRT ON 12/24/2017    pO2, Arterial 196 (H) 83.0 - 108.0 mmHg   Bicarbonate 2.4 (L) 20.0 - 28.0 mmol/L   Acid-base deficit 26.7 (H) 0.0 - 2.0 mmol/L   O2 Saturation 98.3 %   Patient temperature 98.6    Collection site RIGHT RADIAL    Drawn by [facility] STOCKENAUER    Sample type ARTERIAL DRAW    Allens test (pass/fail) PASS PASS  Blood culture (routine x 2)     Status: None (Preliminary result)   Collection Time: 12/24/17  5:00 AM  Result Value Ref Range   Specimen Description BLOOD LEFT ARM    Special Requests      BOTTLES DRAWN AEROBIC ONLY Blood Culture results may not be optimal due to an inadequate volume of blood received in culture bottles   Culture      NO GROWTH <12 HOURS Performed at [facility] [address], [place], [place] [address]    Report  Status PENDING   Beta-hydroxybutyric acid     Status: Abnormal   Collection Time: 12/24/17  5:00 AM  Result Value Ref Range   Beta-Hydroxybutyric Acid 1.16 (H) 0.05 - 0.27 mmol/L    Comment: Performed at [facility] [address] [place], [place] [address]  CBC     Status: Abnormal   Collection Time: 12/24/17  5:00 AM  Result Value Ref Range   WBC 17.2 (H) 4.0 - 10.5 K/uL   RBC 3.98 3.87 - 5.11 MIL/uL   Hemoglobin 12.3 12.0 - 15.0 g/dL   HCT 36.3 36.0 - 46.0 %   MCV 91.2 78.0 - 100.0 fL   MCH 30.9 26.0 - 34.0 pg   MCHC 33.9 30.0 - 36.0 g/dL   RDW 15.2 11.5 - 15.5 %   Platelets 304 150 - 400 K/uL    Comment: Performed at [facility], [place] [address], [place], [place] [address]  Salicylate  level     Status: None   Collection Time: 12/24/17  5:00 AM  Result Value Ref Range   Salicylate Lvl <3.2 2.8 - 30.0 mg/dL    Comment: Performed at [facility], [place] [address], [place], [place] [address]  Volatiles,Blood (acetone,ethanol,isoprop,methanol)     Status: None   Collection Time: 12/24/17  5:00 AM  Result Value Ref Range   Acetone, blood Negative 0.000 - 0.010 %    Comment: (NOTE)                                Detection Limit = 0.010 This test was developed and its performance characteristics determined by [facility]. It has not been cleared or approved by the Food and Drug Administration.    Ethanol, blood Negative 0.000 - 0.010 %    Comment:                                 Detection Limit = 0.010   Isopropanol, blood Negative 0.000 - 0.010 %    Comment:                                 Detection Limit = 0.010   Methanol, blood Negative 0.000 - 0.010 %    Comment: (NOTE)                                Detection Limit = 0.010 Performed At: [facility] [place], [place] [id number] Rush Farmer MD SN:[id number]   Basic metabolic panel     Status: Abnormal   Collection Time: 12/24/17  5:00 AM  Result Value Ref Range   Sodium 137 135  - 145 mmol/L    Comment: DELTA CHECK NOTED   Potassium 2.3 (LL) 3.5 - 5.1 mmol/L    Comment: CRITICAL RESULT CALLED TO, READ BACK BY AND VERIFIED WITH: CASTRO,E RN 12/24/2017 0620 JORDANS    Chloride 110 98 - 111 mmol/L   CO2 8 (L) 22 - 32 mmol/L   Glucose, Bld 83 70 - 99 mg/dL   BUN 74 (H) 6 - 20 mg/dL   Creatinine, Ser 3.89 (H) 0.44 - 1.00 mg/dL   Calcium 6.8 (L) 8.9 - 10.3 mg/dL   GFR calc non Af Amer 12 (L) >60 mL/min   GFR calc Af Amer 14 (L) >60 mL/min    Comment: (NOTE) The eGFR has been calculated using the CKD EPI equation. This calculation has not been validated in all clinical situations. eGFR's persistently <60 mL/min signify possible Chronic Kidney Disease.    Anion gap 19 (H) 5 - 15    Comment: Performed at [facility], [place] [address] [place], [place] [address]  Basic metabolic panel     Status: Abnormal   Collection Time: 12/24/17  7:08 AM  Result Value Ref Range   Sodium 138 135 - 145 mmol/L   Potassium 2.5 (LL) 3.5 - 5.1 mmol/L    Comment: CRITICAL RESULT CALLED TO, READ BACK BY AND VERIFIED WITH: NARAMDAS,J RN @ 2409 12/24/17 LEONARD,A    Chloride 104 98 - 111 mmol/L   CO2 9 (L) 22 - 32 mmol/L   Glucose, Bld 79 70 - 99 mg/dL   BUN 76 (  H) 6 - 20 mg/dL   Creatinine, Ser 3.92 (H) 0.44 - 1.00 mg/dL   Calcium 6.8 (L) 8.9 - 10.3 mg/dL   GFR calc non Af Amer 12 (L) >60 mL/min   GFR calc Af Amer 14 (L) >60 mL/min    Comment: (NOTE) The eGFR has been calculated using the CKD EPI equation. This calculation has not been validated in all clinical situations. eGFR's persistently <60 mL/min signify possible Chronic Kidney Disease.    Anion gap 25 (H) 5 - 15    Comment: Performed at [facility], [place] [address] [place], [place] [address]  Blood gas, arterial     Status: Abnormal   Collection Time: 12/24/17  9:01 AM  Result Value Ref Range   FIO2 21.00    pH, Arterial 7.201 (L) 7.350 - 7.450   pCO2 arterial 19.6 (LL) 32.0 - 48.0 mmHg    Comment:  CRITICAL RESULT CALLED TO, READ BACK BY AND VERIFIED WITH:  ASHLEY DICKENS, RRT AT 0906 BY L KELLEY RRT RCP ON 12/24/17    pO2, Arterial 104 83.0 - 108.0 mmHg   Bicarbonate 7.4 (L) 20.0 - 28.0 mmol/L   Acid-base deficit 19.3 (H) 0.0 - 2.0 mmol/L   O2 Saturation 97.3 %   Patient temperature 98.6    Collection site RIGHT RADIAL    Drawn by [id number]    Sample type ARTERIAL DRAW    Allens test (pass/fail) PASS PASS  Basic metabolic panel     Status: Abnormal   Collection Time: 12/24/17  9:42 AM  Result Value Ref Range   Sodium 137 135 - 145 mmol/L   Potassium 2.7 (LL) 3.5 - 5.1 mmol/L    Comment: CRITICAL RESULT CALLED TO, READ BACK BY AND VERIFIED WITH: K.MOORE,RN 12/24/17 1033 DAVISB    Chloride 108 98 - 111 mmol/L   CO2 8 (L) 22 - 32 mmol/L   Glucose, Bld 74 70 - 99 mg/dL   BUN 74 (H) 6 - 20 mg/dL   Creatinine, Ser 3.60 (H) 0.44 - 1.00 mg/dL   Calcium 6.6 (L) 8.9 - 10.3 mg/dL   GFR calc non Af Amer 14 (L) >60 mL/min   GFR calc Af Amer 16 (L) >60 mL/min    Comment: (NOTE) The eGFR has been calculated using the CKD EPI equation. This calculation has not been validated in all clinical situations. eGFR's persistently <60 mL/min signify possible Chronic Kidney Disease.    Anion gap 21 (H) 5 - 15    Comment: Performed at [facility], [place] [address], [place], [place] [address]    Dg Chest 2 View  Result Date: 12/23/2017 CLINICAL DATA:  Dysuria, weakness EXAM: CHEST - 2 VIEW COMPARISON:  None. FINDINGS: Lungs are clear.  No pleural effusion or pneumothorax. The heart is normal in size. Visualized osseous structures are within normal limits. IMPRESSION: Normal chest radiographs. Electronically Signed   By: Julian Hy M.D.   On: 12/23/2017 22:06   Ct Renal Stone Study  Result Date: 12/23/2017 CLINICAL DATA:  Dysuria, weakness EXAM: CT ABDOMEN AND PELVIS WITHOUT CONTRAST TECHNIQUE: Multidetector CT imaging of the abdomen and pelvis was performed following the standard  protocol without IV contrast. COMPARISON:  12/18/2013 FINDINGS: Lower chest: Lung bases are clear. Hepatobiliary: Mild hepatic steatosis. Layering tiny gallstones (series 3/image 29), without associated inflammatory changes. No intrahepatic or extrahepatic ductal dilatation. Pancreas: Within normal limits. Spleen: Within normal limits. Adrenals/Urinary Tract: 2.9 cm right adrenal adenoma (series 3/image 19), previously 2.2  cm. Left adrenal gland is within normal limits. Right kidney is notable for a 5 mm nonobstructing right lower pole renal calculus (series 3/image 41). Vascular calcifications. No hydronephrosis. 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 Layering calcifications in a left lower pole calyceal diverticulum (series 3/image 39). No hydronephrosis. Bladder is notable for nondependent gas. Stomach/Bowel: Stomach is within normal limits. No evidence of bowel obstruction. Normal appendix (series 3/image 58). Sigmoid diverticulosis, without evidence of diverticulitis. Vascular/Lymphatic: No evidence of abdominal aortic aneurysm. Prominent para-aortic nodes measuring up to 10 mm short axis (series 3/image 34), at the upper limits of normal. Reproductive: Uterine IUD, mildly obliqued, with the right arm possibly embedded in the myometrium (series 3/image 69). Bilateral ovaries are unremarkable. Other: No abdominopelvic ascites. Tiny fat containing periumbilical hernia (series 3/image 62). Musculoskeletal: Mild degenerative changes of the visualized thoracolumbar spine. 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 No hydronephrosis. Nondependent gas in the bladder, correlate for cystitis. No evidence of bowel obstruction. Normal appendix. Sigmoid diverticulosis, without evidence of diverticulitis.  Cholelithiasis, without associated inflammatory changes. Uterine IUD may be mildly obliqued, with its right arm possibly embedded in the myometrium. Electronically Signed   By: Julian Hy M.D.   On: 12/23/2017 23:57    Review of Systems  Constitutional: Positive for malaise/fatigue. Negative for fever.  HENT: Negative.   Respiratory: Negative.   Cardiovascular: Negative.   Gastrointestinal: Positive for abdominal pain and nausea.  Genitourinary: Positive for hematuria and urgency.  Musculoskeletal: Negative.   Skin: Negative.   Neurological: Negative.   Endo/Heme/Allergies: Negative.   Psychiatric/Behavioral: Negative.    Blood pressure (!) 54/17, pulse 72, temperature 97.9 F (36.6 C), temperature source Oral, resp. rate 18, height 5' 3" (1.6 m), weight 124.7 kg, SpO2 100 %. Physical Exam  Constitutional: She appears well-developed.  Some moon fascies  HENT:  Head: Normocephalic.  Eyes: Pupils are equal, round, and reactive to light.  Neck: Normal range of motion.  Cardiovascular: Normal rate.  Respiratory: Effort normal.  GI: Soft.  Morbid truncal obesity.   Genitourinary:  Genitourinary Comments: No CVAT at present  Musculoskeletal: Normal range of motion.  Neurological: She is alert.  Skin: Skin is warm.  Psychiatric: She has a normal mood and affect.    Assessment/Plan:  1 -  Acute Renal Failure  - multifactorial but mostly likely NSAID toxicity + some mild pre-renal hypotension. Minimal overall nephron obstruction by imaging, but may be playing minor role.   2 -  Left > Right Bilateral Renal Stones - Very large stones, will need staged bilateral combination left percutaneous / right retrograde surgeyr for goal of stone free in leective setting after clears infecitous parameters.  Rec left upper pole nephrostomy tube this admission to allow for renal decompression in setting of ARF, focal upper pole hydro / infection, and this will allow site for future renal  access for stone surgery.   3 - Bacteruria - many bacteria on UA from ER 12/2017, some left upper pole focal obstruction from stone. WBC 20k. UCX 10/4 penidng. Placed on empiric rocephin.  4 - Hematuria - likely from stones. She will get elective cysto as part of stone surgery to complete eval.  Please call me directly with questions anytime.   Paytan Recine 12/24/2017, 1:03 PM

## 2017-12-24 NOTE — Progress Notes (Addendum)
PROGRESS NOTE  Pamela Carney MOQ:[id number] DOB: 11-15-1965 DOA: 12/23/2017 PCP: Tamsen Roers, MD  Brief Narrative: 52 year old woman PMH migraine headaches with excessive and daily ibuprofen use presented with dysuria onset 2 months prior, intermittent hematuria, low back pain, generalized weakness.  Found to have acute kidney injury, anion gap metabolic acidosis, bilateral nephrolithiasis, cystitis  Assessment/Plan Acute kidney injury on CKD stage III with severe anion gap metabolic acidosis, complicated by excessive daily ibuprofen use, hydrochlorothiazide, losartan.  Nonoliguric.  Beta hydroxybutyrate was slightly elevated but nondiagnostic.  Euglycemic.  No evidence of DKA. --Acidosis thought to be multifactorial including starvation ketoacidosis and acute kidney injury.  Continue IV bicarbonate --Keep n.p.o. in case patient needs dialysis catheter, however urine output is adequate, ABG has improved, and hopefully can be managed medically, I discussed with Dr. Johnney Ou. --Every 4 BMP  Hypotension, sepsis secondary to UTI, however may be more related to hypovolemia as she does not clinically have features to suggest sepsis and lactic acid was within normal limits. --Bolus normal saline, upgrade to stepdown level of care for closer hemodynamic monitoring --Continue empiric antibiotics, follow-up culture data  UTI, sepsis with hematuria complicated by staghorn calculus, nonobstructing --continue ceftriaxone --discussed with Dr. Tresa Moore urology, he suggests left-sided nephrostomy tube, she will need this also for eventual percutaneous intervention as well. He does not believe that this is urgent/emergent, but suggests it be done when able this admission--he will see in consult and order  Severe hypokalemia --Check magnesium level.  Replete potassium.  Serial BMP.  Oral candidiasis --nystatin --screen HIV  Generalized weakness secondary to metabolic derangement as above.  There is no  reason to suspect cord syndrome.  Her strength in her lower extremities is intact and she has no reported sensory changes.  There is no indication to proceed with further evaluation. --PT eval tomorrow  Atrial fibrillation?  Not on anticoagulation.  Continue bisoprolol.  Not clear whether she truly has this diagnosis.  Anxiety, bipolar 1 disorder --Appears stable.  Resume Depakote and Lamictal when condition is stabilized  Essential hypertension --Hold bisoprolol, hydrochlorothiazide, losartan  Possible oblique IUD, this can be followed up in the outpatient setting  EARLY SKIN breakdown sacrum, present on admission --wound care consult  Critical care time 55 minutes, discussion with specialist as above, management of metabolic acidosis, renal failure, sepsis.  ADDENDUM SBP 60-70s, confirmed with manual, both arms; pt awake and alert, follows commands. She does not appear toxic. Other vitals stable. Patient with no complaints. Lungs clear. Perfusion hands and feet intact, but somewhat pale with sluggish capillary refill. Currently on 7th liter IVF. Good UOP. I've consulted PCCM, I think pt will require vasopressor and transfer to ICU.  DVT prophylaxis: SCDs Code Status: Full Family Communication: none Disposition Plan: home    Murray Hodgkins, MD  [facility] Direct contact: [phone] --Via amion app OR  --[email or web address]; password [email or web address]  7PM-7AM contact night coverage as above 12/24/2017, 10:20 AM  LOS: 0 days   Consultants:  Nephrology   Urology   Procedures:    Antimicrobials:  Ceftriaxone 10/3 >  Interval history/Subjective: Feels pain around catheter site. No other complaints except nausea.  Objective: Vitals:  Vitals:   12/24/17 0833 12/24/17 0841  BP: (!) 75/54 (!) 78/52  Pulse: 82   Resp: 18   Temp: 98.2 F (36.8 C)   SpO2: 98%     Exam:  Constitutional:  . Appears calm and mildly uncomfortable Eyes:  . pupils and irises appear  normal . Normal lids  ENMT:  . grossly normal hearing  . Oromucosa appears to have thrush, there is a white exudate on the tongue and buccal mucosa. Respiratory:  . CTA bilaterally, no w/r/r.  . Respiratory effort normal.  Cardiovascular:  . RRR, no m/r/g . No LE extremity edema   Abdomen:  . Soft, mild generalized tenderness Musculoskeletal:  . Digits/nails BUE: no clubbing, cyanosis, petechiae, infection . RUE, LUE, RLE, LLE   o strength and tone normal, no atrophy, no abnormal movements Skin:  . There is some early skin breakdown perirectally, examined with RN Kami at bedside Psychiatric:  . Mental status o Mood, affect appropriate   I have personally reviewed the following:   Data: . Urine output 2150 . Repeat ABG seven-point 2/19/104 . Potassium 2.5, BUN 76, creatinine 3.92 . WBC 17.2 . Salicylate level negative, beta hydroxybutyrate level equivocal . Urinalysis with large hemoglobin, leukocyte esterase . Chest x-ray independently reviewed no acute disease . CT abdomen pelvis reviewed, multiple bilateral nonobstructing renal calculi including 2.2 cm partial staghorn calculus on the left no hydronephrosis.  Scheduled Meds: .  morphine injection  4 mg Intravenous Once  . nystatin  5 mL Oral QID  . ondansetron (ZOFRAN) IV  4 mg Intravenous Once  . sodium bicarbonate  50 mEq Intravenous Once   Continuous Infusions: . [START ON 12/25/2017] cefTRIAXone (ROCEPHIN)  IV    . potassium chloride 10 mEq (12/24/17 1009)  . potassium chloride    .  sodium bicarbonate (isotonic) infusion in sterile water Stopped (12/24/17 0908)    Principal Problem:   Acute kidney failure (HCC) Active Problems:   Acute lower UTI   Metabolic acidosis   Chronic interstitial nephritis   Generalized weakness   Sepsis (HCC)   Hypokalemia   Oral candidiasis   LOS: 0 days

## 2017-12-24 NOTE — Consult Note (Signed)
[place] Nurse wound consult note Reason for Consult:Buttocks and perineal area moisture damage, sub pannus moisture, bilateral groin moisture left greater than right,dried peeling skin partial thickness area benealth right breast. Wound type: MASD, Intertriginous  Pressure Injury POA: NA Measurement:Groin skin damage 25cm x 10cm, sub pannus 10cm x 5cm and below right breast 3cm x 5cm x 0.1cm. Perineal and buttocks area not measured.  Wound bed: skin intact, just discolored from moisture. Drainage (amount, consistency, odor) none Periwound:moist but intact Dressing procedure/placement/frequency:I have provided nurses with orders for InterDry Ag+ for bilateral groins, and sub abdominal pannus. Purple Top Barrier ointment to perineal area BID, and foam to area beneath right breast. We will not follow, but will remain available to this patient, to nursing, and the medical and/or surgical teams.  Please re-consult if we need to assist further.  Fara Olden, RN-C, WTA-C, [place] [facility]

## 2017-12-24 NOTE — Progress Notes (Signed)
Received report from ED RN,Annie. Room ready for patient. Muhannad Bignell, Wonda Cheng, Therapist, sports

## 2017-12-24 NOTE — Consult Note (Addendum)
NAME:  Pamela Carney, MRN:  [id number], DOB:  May 07, 1965, LOS: 0 ADMISSION DATE:  12/23/2017, CONSULTATION DATE:  10/4 REFERRING MD:  Sarajane Jews, CHIEF COMPLAINT:  hypotension   Brief History   52 year old female admitted 10/4 for renal failure, UTI, and metabolic acidosis. Course complicated by profound hypotension.   Past Medical History  Migraine  Significant Hospital Events   10/4 admit.  Consults: date of consult/date signed off & final recs:  Urology 10/4 > for nephrolithiasis recommend nephrostomy and eventual stent.  PCCM 10/4   Procedures (surgical and bedside):  IR nephrostomy 10/5 >  Significant Diagnostic Tests:  CT renal 10/4 > Multiple bilateral nonobstructing renal calculi, including a dominant 2.2 cm lower pole partial staghorn calculus on the left. No Hydronephrosis. Nondependent gas in the bladder, correlate for cystitis. Cholelithiasis, without associated inflammatory changes. Uterine IUD may be mildly obliqued, with its right arm possibly embedded in the myometrium.  Micro Data:  Blood 10/3 > Urine 10/3 >  Antimicrobials:  CTX 10/3 >  Subjective:  Generally feels bad. No specific complaints.   Objective   Blood pressure (!) 74/55, pulse 76, temperature 97.9 F (36.6 C), temperature source Oral, resp. rate 18, height 5\' 3"  (1.6 m), weight 124.7 kg, SpO2 100 %.        Intake/Output Summary (Last 24 hours) at 12/24/2017 1353 Last data filed at 12/24/2017 1130 Gross per 24 hour  Intake 3826.07 ml  Output 3325 ml  Net 501.07 ml   Filed Weights   12/23/17 2121  Weight: 124.7 kg    Examination: General: obese middle aged female in NAD HENT: [place]/AT, PERRL, no JVD Lungs: Clear bilateral breath sounds Cardiovascular: RRR, no MRG Abdomen: Soft, diffusely tender. Non-distended Extremities: No acute deformity or ROM limitation Neuro: alert, oriented, non-focal.  GU: Clear yellow urine in foley bag.   Resolved Hospital Problem list     Assessment &  Plan:   Hypotension: etiology not entirely clear. Likely secondary to sepsis in the setting of urinary tract infection, however, lactic acid has been normal. She is making adequate urine and is mentating well.   - s/p 7L IVF in the last 16 hours. Hold further IVF resuscitation - Transfer to ICU for closer monitoring - Repeat lactic acid pending.  - Monitor urine output.   Sepsis: secondary to urinary tract infection.  - Continue empiric antibiotics.  - Follow cultures  Acute renal failure: multifactorial. She has been consuming a significant amount of NSAIDs for abdominal pain. She has also had no appetite and has had very poor PO intake for several weeks by her own account. CT renal demonstrates bilateral nephrolithiasis, with no clear obstruction and no hydronephrosis.  - s/p 7 L IVF hydration - Urology consulted and requesting nephrostomy tube placement - IR consulted for nephrostomy - Follow BMP - Hope she can ultimately avoid HD  Anion gap acidosis: ddx includes ketoacidosis from poor PO intake, lactic acidosis (repeat pending), acute renal failure. Urine Ketones negative on admission. Beta-hydroxybutyrate mildly elevated. Lactic normal on admission.  - Sodium bicarbonate infusion due to severity of acidosis (continue) - Follow BMP  Hypokalemia Hypocalcemia - replace K - replace Ca - check mag  Hypertension - holding bisoprolol, HCTZ, losartan    Disposition / Summary of Today's Plan 12/24/17   Transfer to ICU May need pressors if mental status declines.  Continue antibiotics For nephrostomy tube placement.     Diet: NPO Pain/Anxiety/Delirium protocol (if indicated): na VAP protocol (if indicated): na  DVT prophylaxis: SCDs GI prophylaxis: now dose pepcid with respect to renal failure.  Hyperglycemia protocol: on chemistry Mobility:BR Code Status: Full Family Communication: Patient updated.   Labs   CBC: Recent Labs  Lab 12/23/17 2123 12/24/17 0500  WBC  20.2* 17.2*  NEUTROABS 17.3*  --   HGB 15.2* 12.3  HCT 45.9 36.3  MCV 94.1 91.2  PLT 381 546    Basic Metabolic Panel: Recent Labs  Lab 12/23/17 2123 12/24/17 0500 12/24/17 0708 12/24/17 0942  NA 130* 137 138 137  K 4.5 2.3* 2.5* 2.7*  CL 100 110 104 108  CO2 <7* 8* 9* 8*  GLUCOSE 129* 83 79 74  BUN 84* 74* 76* 74*  CREATININE 5.15* 3.89* 3.92* 3.60*  CALCIUM 8.0* 6.8* 6.8* 6.6*   GFR: Estimated Creatinine Clearance: 23.7 mL/min (A) (by C-G formula based on SCr of 3.6 mg/dL (H)). Recent Labs  Lab 12/23/17 2123 12/23/17 2156 12/24/17 0500  WBC 20.2*  --  17.2*  LATICACIDVEN  --  0.73  --     Liver Function Tests: Recent Labs  Lab 12/23/17 2123  AST 19  ALT 17  ALKPHOS 89  BILITOT 1.5*  PROT 6.6  ALBUMIN 3.1*   No results for input(s): LIPASE, AMYLASE in the last 168 hours. No results for input(s): AMMONIA in the last 168 hours.  ABG    Component Value Date/Time   PHART 7.201 (L) 12/24/2017 0901   PCO2ART 19.6 (LL) 12/24/2017 0901   PO2ART 104 12/24/2017 0901   HCO3 7.4 (L) 12/24/2017 0901   TCO2 25 03/31/2010 0259   ACIDBASEDEF 19.3 (H) 12/24/2017 0901   O2SAT 97.3 12/24/2017 0901     Coagulation Profile: Recent Labs  Lab 12/23/17 2123  INR 1.38    Cardiac Enzymes: No results for input(s): CKTOTAL, CKMB, CKMBINDEX, TROPONINI in the last 168 hours.  HbA1C: No results found for: HGBA1C  CBG: No results for input(s): GLUCAP in the last 168 hours.  Admitting History of Present Illness.   52 year old female with past medical history as below, which is significant for hypertension, atrial fibrillation(not on anticoagulation), bipolar disorder, and anxiety.  She was in her usual state of health until about 2 months prior to presentation when she started to develop dysuria.  She then began to notice intermittent hematuria accompanied with lower abdominal pain and low back pain, for which she would take excessive amounts of NSAIDs. Then on 10/4 she  developed generalized weakness and had difficulty walking.  Work-up in the emergency department discovered kidney injury and elevated white count.  She was started on empiric antibiotics for suspected UTI.  Renal CT demonstrated bilateral nephrolithiasis, however, no obstruction or hydronephrosis was described.  She was also hypotensive requiring 3 L of normal saline in the emergency department.  Blood pressure remained borderline and she was admitted to hospitalist service.  However, over the course of the day she became progressively hypotensive despite a total of 7 L of crystalloid resuscitation.  PCCM was consulted for shock.  Review of Systems:   Bolds are positive  Constitutional: weight loss, gain, night sweats, Fevers, chills, fatigue .  HEENT: headaches, Sore throat, sneezing, nasal congestion, post nasal drip, Difficulty swallowing, Tooth/dental problems, visual complaints visual changes, ear ache CV:  chest pain, radiates:,Orthopnea, PND, swelling in lower extremities, dizziness, palpitations, syncope.  GI  heartburn, indigestion, abdominal pain, nausea, vomiting, diarrhea, change in bowel habits, loss of appetite, bloody stools.   Resp: cough, productive: , hemoptysis, dyspnea, chest pain, pleuritic.  Skin: rash or itching or icterus GU: dysuria, change in color of urine, urgency or frequency. flank pain, hematuria  MS: joint pain or swelling. decreased range of motion  Psych: change in mood or affect. depression or anxiety.  Neuro: difficulty with speech, weakness, numbness, ataxia    Past Medical History  She,  has a past medical history of Abnormal Pap smear, Anxiety, Atrial fibrillation ([place]), Bipolar 1 disorder ([place]), Genital warts, and Hypertension.   Surgical History    Past Surgical History:  Procedure Laterality Date  . CERVICAL CONE BIOPSY    . CESAREAN SECTION    . DILATION AND CURETTAGE OF UTERUS    . OVARIAN CYST REMOVAL    . UNILATERAL SALPINGECTOMY     Pt unsure  of which fallopian tube was removed     Social History   Social History   Socioeconomic History  . Marital status: Married    Spouse name: Not on file  . Number of children: Not on file  . Years of education: Not on file  . Highest education level: Not on file  Occupational History  . Not on file  Social Needs  . Financial resource strain: Not on file  . Food insecurity:    Worry: Not on file    Inability: Not on file  . Transportation needs:    Medical: Not on file    Non-medical: Not on file  Tobacco Use  . Smoking status: Current Every Day Smoker    Packs/day: 1.00    Years: 32.00    Pack years: 32.00    Types: Cigarettes  . Smokeless tobacco: Never Used  Substance and Sexual Activity  . Alcohol use: No    Alcohol/week: 0.0 standard drinks  . Drug use: No  . Sexual activity: Yes    Birth control/protection: IUD  Lifestyle  . Physical activity:    Days per week: Not on file    Minutes per session: Not on file  . Stress: Not on file  Relationships  . Social connections:    Talks on phone: Not on file    Gets together: Not on file    Attends religious service: Not on file    Active member of club or organization: Not on file    Attends meetings of clubs or organizations: Not on file    Relationship status: Not on file  . Intimate partner violence:    Fear of current or ex partner: Not on file    Emotionally abused: Not on file    Physically abused: Not on file    Forced sexual activity: Not on file  Other Topics Concern  . Not on file  Social History Narrative  . Not on file  ,  reports that she has been smoking cigarettes. She has a 32.00 pack-year smoking history. She has never used smokeless tobacco. She reports that she does not drink alcohol or use drugs.   Family History   Her family history includes COPD in her paternal grandfather; Cancer in her father and paternal grandmother; Diabetes in her mother and paternal grandfather; Heart attack in her  father, mother, and sister; Heart disease in her mother, paternal grandfather, and paternal grandmother; Heart failure in her mother; Hyperlipidemia in her mother; Hypertension in her father, mother, and sister; Peripheral vascular disease in her mother.   Allergies Allergies  Allergen Reactions  . Sulfa Antibiotics Hives and Swelling    Swelling site not recalled  . Latex Rash  . Tape  Rash    Not tolerated well     Home Medications  Prior to Admission medications   Medication Sig Start Date End Date Taking? Authorizing Provider  ibuprofen (ADVIL,MOTRIN) 200 MG tablet Take 1,000 mg by mouth every 4 (four) hours as needed (FOR PAIN).    Yes [provider]  Multiple Vitamins-Minerals (ONE-A-DAY WOMENS 50+ ADVANTAGE) TABS Take 1 tablet by mouth daily.   Yes [provider]  ALPRAZolam Duanne Moron) 0.5 MG tablet Take 0.5 mg by mouth 4 (four) times daily.    [provider]  bisoprolol-hydrochlorothiazide (ZIAC) 10-6.25 MG per tablet Take 1 tablet by mouth daily.    [provider]  butalbital-acetaminophen-caffeine (FIORICET WITH CODEINE) 50-325-40-30 MG per capsule Take 2 capsules by mouth 3 (three) times daily.    [provider]  divalproex (DEPAKOTE ER) 500 MG 24 hr tablet Take 2,000 mg by mouth daily.    [provider]  lamoTRIgine (LAMICTAL) 100 MG tablet Take 100 mg by mouth daily.    [provider]  losartan (COZAAR) 25 MG tablet Take 25 mg by mouth daily.    [provider]      Georgann Housekeeper, AGACNP-BC [place] Pager [phone] or [phone]  12/24/2017 1:53 PM  Attending Note:  52 year old female presenting in septic shock from a urinary source with stones.  PCCM called for patient being hypotensive.  On exam, she is alert and oriented but SBP is in the 70's with clear lungs.  I reviewed CXR myself, no acute disease noted.  Discussed with [facility]-MD.  Will transfer patient to the ICU,  start peripheral neo, change to zosyn with adjustment for renal dose, check cortisol level and start stress dose steroids.  Hold of central line placement for now.  PCCM will assume care.  The patient is critically ill with multiple organ systems failure and requires high complexity decision making for assessment and support, frequent evaluation and titration of therapies, application of advanced monitoring technologies and extensive interpretation of multiple databases.   Critical Care Time devoted to patient care services described in this note is  45  Minutes. This time reflects time of care of this signee Dr Jennet Maduro. This critical care time does not reflect procedure time, or teaching time or supervisory time of PA/NP/Med student/Med Resident etc but could involve care discussion time.  Rush Farmer, M.D. [facility] Pulmonary/Critical Care Medicine. Pager: [phone]. After hours pager: [phone].

## 2017-12-24 NOTE — Progress Notes (Signed)
Pharmacy Antibiotic Note  Pamela Carney is a 52 y.o. female admitted on 12/23/2017 with UTI.  Pharmacy has been consulted for Ceftazidime dosing.  Plan: Ceftazidime 1 gram IV q24hr Follow Scr, cultures and sensitivities  Height: 5' 2.99" (160 cm) Weight: 230 lb 13.2 oz (104.7 kg) IBW/kg (Calculated) : 52.38  Temp (24hrs), Avg:98.1 F (36.7 C), Min:97.8 F (36.6 C), Max:98.6 F (37 C)  Recent Labs  Lab 12/23/17 2123 12/23/17 2156 12/24/17 0500 12/24/17 0708 12/24/17 0942 12/24/17 1324  WBC 20.2*  --  17.2*  --   --   --   CREATININE 5.15*  --  3.89* 3.92* 3.60* 3.30*  LATICACIDVEN  --  0.73  --   --   --  0.8    Estimated Creatinine Clearance: 23.3 mL/min (A) (by C-G formula based on SCr of 3.3 mg/dL (H)).    Allergies  Allergen Reactions  . Sulfa Antibiotics Hives and Swelling    Swelling site not recalled  . Latex Rash  . Tape Rash    Not tolerated well    Antimicrobials this admission: 10/4 Ceftaz >>  10/4 Rocephin >> 10/4 10/4 Cipro >> 10/4   Microbiology results: 10/3 BCx: pending 10/3 UCx: pending    Thank you for allowing pharmacy to be a part of this patient's care.  Alanda Slim, PharmD, [facility] Clinical Pharmacist Please see AMION for all Pharmacists' Contact Phone Numbers 12/24/2017, 4:16 PM

## 2017-12-24 NOTE — Progress Notes (Signed)
Patient's Critical ABG values were RBV by Murray Hodgkins, MD at [place] on 12/24/2017 by Otho Ket, RRT

## 2017-12-24 NOTE — H&P (Signed)
History and Physical    Pamela Carney:[id number] DOB: 1965-05-05 DOA: 12/23/2017  PCP: Tamsen Roers, MD  Patient coming from: Home  I have personally briefly reviewed patient's old medical records in [place]  Chief Complaint: Dysuria, hematuria  HPI: Pamela Carney is a 52 y.o. female with medical history significant of chronic migraine headaches.  Patient reports she began to develop dysuria 2 months ago.  1 month ago she began to have intermittent hematuria, B low back pain, and generalized weakness described as "unsteady gait" when she walked. Today she developed generalized weakness to the point where she couldn't walk and called 911.  She specifically denies pain in legs, denies saddle anesthesia.  Does have some distal "neuropathy" in toes.  She does have urinary hesitancy when she starts urinating but doesn't describe severe retention.   ED Course: WBC 20.2k, Creat 5.1 up from 1.2 in 2015.  Sodium 130, Cl 100, CO2 < 7, BUN 84.  BGL 129.  Lactic acid 0.73.  UA shows 50 glucose, 100 protein, no keytones, large HGB and LE.  Many bacteria, >50 RBC and WBC both per HPF.  CT scan (non-contrast of course) showed B nephrolithiasis, no obstruction nor hydronephrosis.  Did show gas in bladder and suggested cystitis.  400cc UOP with foley placement.  Patient put on rocephin, got 3L NS thus far in ED.  BP initially on the soft side but is now running 269S systolic.  Patient admits to taking "too much" ibuprofen.  Specifically she has been taking ibuprofen for migraines since age 73, 1-2 years ago she stopped all other migraine medications and has been taking 1gm ibuprofen Q4H she reports up to recently.   Review of Systems: As per HPI otherwise 10 point review of systems negative.   Past Medical History:  Diagnosis Date  . Abnormal Pap smear   . Anxiety   . Atrial fibrillation ([place])   . Bipolar 1 disorder ([place])   . Genital warts    Hx of genital  . Hypertension      Past Surgical History:  Procedure Laterality Date  . CERVICAL CONE BIOPSY    . CESAREAN SECTION    . DILATION AND CURETTAGE OF UTERUS    . OVARIAN CYST REMOVAL    . UNILATERAL SALPINGECTOMY     Pt unsure of which fallopian tube was removed     reports that she has been smoking cigarettes. She has a 32.00 pack-year smoking history. She has never used smokeless tobacco. She reports that she does not drink alcohol or use drugs.  Allergies  Allergen Reactions  . Sulfa Antibiotics Hives and Swelling    Swelling site not recalled  . Latex Rash  . Tape Rash    Not tolerated well    Family History  Problem Relation Age of Onset  . Diabetes Paternal Grandfather   . COPD Paternal Grandfather   . Heart disease Paternal Grandfather   . Cancer Paternal Grandmother   . Heart disease Paternal Grandmother   . Cancer Father   . Hypertension Father   . Heart attack Father   . Heart failure Mother   . Diabetes Mother   . Heart disease Mother   . Hyperlipidemia Mother   . Hypertension Mother   . Heart attack Mother   . Peripheral vascular disease Mother   . Hypertension Sister   . Heart attack Sister      Prior to Admission medications   Medication Sig Start Date End  Date Taking? Authorizing Provider  ibuprofen (ADVIL,MOTRIN) 200 MG tablet Take 1,000 mg by mouth every 4 (four) hours as needed (FOR PAIN).    Yes [provider]  Multiple Vitamins-Minerals (ONE-A-DAY WOMENS 50+ ADVANTAGE) TABS Take 1 tablet by mouth daily.   Yes [provider]  ALPRAZolam Duanne Moron) 0.5 MG tablet Take 0.5 mg by mouth 4 (four) times daily.    [provider]  bisoprolol-hydrochlorothiazide (ZIAC) 10-6.25 MG per tablet Take 1 tablet by mouth daily.    [provider]  butalbital-acetaminophen-caffeine (FIORICET WITH CODEINE) 50-325-40-30 MG per capsule Take 2 capsules by mouth 3 (three) times daily.    [provider]  divalproex (DEPAKOTE ER) 500 MG 24 hr  tablet Take 2,000 mg by mouth daily.    [provider]  lamoTRIgine (LAMICTAL) 100 MG tablet Take 100 mg by mouth daily.    [provider]  losartan (COZAAR) 25 MG tablet Take 25 mg by mouth daily.    [provider]    Physical Exam: Vitals:   12/23/17 2230 12/24/17 0000 12/24/17 0015 12/24/17 0030  BP: 103/79 (!) 137/96 113/73 (!) 104/58  Pulse: 76 85 86 80  Resp: 17 19 18 16   Temp:      TempSrc:      SpO2: 100% 99% 97% 99%  Weight:      Height:        Constitutional: NAD, calm, comfortable Eyes: PERRL, lids and conjunctivae normal ENMT: Mucous membranes are moist. Posterior pharynx clear of any exudate or lesions.Normal dentition.  Neck: normal, supple, no masses, no thyromegaly Respiratory: clear to auscultation bilaterally, no wheezing, no crackles. Normal respiratory effort. No accessory muscle use.  Cardiovascular: Regular rate and rhythm, no murmurs / rubs / gallops. No extremity edema. 2+ pedal pulses. No carotid bruits.  Abdomen: no tenderness, no masses palpated. No hepatosplenomegaly. Bowel sounds positive.  Musculoskeletal: no clubbing / cyanosis. No joint deformity upper and lower extremities. Good ROM, no contractures. Normal muscle tone.  Skin: no rashes, lesions, ulcers. No induration Neurologic: CN 2-12 grossly intact. Sensation intact, DTR normal. Strength 5/5 in all 4.  Psychiatric: Normal judgment and insight. Alert and oriented x 3. Normal mood.    Labs on Admission: I have personally reviewed following labs and imaging studies  CBC: Recent Labs  Lab 12/23/17 2123  WBC 20.2*  NEUTROABS 17.3*  HGB 15.2*  HCT 45.9  MCV 94.1  PLT 681   Basic Metabolic Panel: Recent Labs  Lab 12/23/17 2123  NA 130*  K 4.5  CL 100  CO2 <7*  GLUCOSE 129*  BUN 84*  CREATININE 5.15*  CALCIUM 8.0*   GFR: Estimated Creatinine Clearance: 16.6 mL/min (A) (by C-G formula based on SCr of 5.15 mg/dL (H)). Liver Function Tests: Recent Labs   Lab 12/23/17 2123  AST 19  ALT 17  ALKPHOS 89  BILITOT 1.5*  PROT 6.6  ALBUMIN 3.1*   No results for input(s): LIPASE, AMYLASE in the last 168 hours. No results for input(s): AMMONIA in the last 168 hours. Coagulation Profile: Recent Labs  Lab 12/23/17 2123  INR 1.38   Cardiac Enzymes: No results for input(s): CKTOTAL, CKMB, CKMBINDEX, TROPONINI in the last 168 hours. BNP (last 3 results) No results for input(s): PROBNP in the last 8760 hours. HbA1C: No results for input(s): HGBA1C in the last 72 hours. CBG: No results for input(s): GLUCAP in the last 168 hours. Lipid Profile: No results for input(s): CHOL, HDL, LDLCALC, TRIG, CHOLHDL, LDLDIRECT in  the last 72 hours. Thyroid Function Tests: No results for input(s): TSH, T4TOTAL, FREET4, T3FREE, THYROIDAB in the last 72 hours. Anemia Panel: No results for input(s): VITAMINB12, FOLATE, FERRITIN, TIBC, IRON, RETICCTPCT in the last 72 hours. Urine analysis:    Component Value Date/Time   COLORURINE YELLOW 12/23/2017 2145   APPEARANCEUR TURBID (A) 12/23/2017 2145   LABSPEC 1.014 12/23/2017 2145   PHURINE 5.0 12/23/2017 2145   GLUCOSEU 50 (A) 12/23/2017 2145   HGBUR LARGE (A) 12/23/2017 2145   BILIRUBINUR NEGATIVE 12/23/2017 2145   [place] NEGATIVE 12/23/2017 2145   PROTEINUR 100 (A) 12/23/2017 2145   UROBILINOGEN 0.2 03/31/2010 0246   NITRITE NEGATIVE 12/23/2017 2145   LEUKOCYTESUR LARGE (A) 12/23/2017 2145    Radiological Exams on Admission: Dg Chest 2 View  Result Date: 12/23/2017 CLINICAL DATA:  Dysuria, weakness EXAM: CHEST - 2 VIEW COMPARISON:  None. FINDINGS: Lungs are clear.  No pleural effusion or pneumothorax. The heart is normal in size. Visualized osseous structures are within normal limits. IMPRESSION: Normal chest radiographs. Electronically Signed   By: Julian Hy M.D.   On: 12/23/2017 22:06   Ct Renal Stone Study  Result Date: 12/23/2017 CLINICAL DATA:  Dysuria, weakness EXAM: CT ABDOMEN AND  PELVIS WITHOUT CONTRAST TECHNIQUE: Multidetector CT imaging of the abdomen and pelvis was performed following the standard protocol without IV contrast. COMPARISON:  12/18/2013 FINDINGS: Lower chest: Lung bases are clear. Hepatobiliary: Mild hepatic steatosis. Layering tiny gallstones (series 3/image 29), without associated inflammatory changes. No intrahepatic or extrahepatic ductal dilatation. Pancreas: Within normal limits. Spleen: Within normal limits. Adrenals/Urinary Tract: 2.9 cm right adrenal adenoma (series 3/image 19), previously 2.2 cm. Left adrenal gland is within normal limits. Right kidney is notable for a 5 mm nonobstructing right lower pole renal calculus (series 3/image 41). Vascular calcifications. No hydronephrosis. 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 Layering calcifications in a left lower pole calyceal diverticulum (series 3/image 39). No hydronephrosis. Bladder is notable for nondependent gas. Stomach/Bowel: Stomach is within normal limits. No evidence of bowel obstruction. Normal appendix (series 3/image 58). Sigmoid diverticulosis, without evidence of diverticulitis. Vascular/Lymphatic: No evidence of abdominal aortic aneurysm. Prominent para-aortic nodes measuring up to 10 mm short axis (series 3/image 34), at the upper limits of normal. Reproductive: Uterine IUD, mildly obliqued, with the right arm possibly embedded in the myometrium (series 3/image 69). Bilateral ovaries are unremarkable. Other: No abdominopelvic ascites. Tiny fat containing periumbilical hernia (series 3/image 62). Musculoskeletal: Mild degenerative changes of the visualized thoracolumbar spine. 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 No hydronephrosis. Nondependent gas in the bladder, correlate for  cystitis. No evidence of bowel obstruction. Normal appendix. Sigmoid diverticulosis, without evidence of diverticulitis. Cholelithiasis, without associated inflammatory changes. Uterine IUD may be mildly obliqued, with its right arm possibly embedded in the myometrium. Electronically Signed   By: Julian Hy M.D.   On: 12/23/2017 23:57    EKG: Independently reviewed.  Assessment/Plan Principal Problem:   Acute kidney failure (HCC) Active Problems:   Acute lower UTI   Metabolic acidosis   Chronic interstitial nephritis   Generalized weakness    1. AKF - worried that we are looking at large component of chronic interstitial nephritis rather than the usual ATN here given the lack of lactic acidosis, the low bicarb, etc 1. Spoke with Dr.  Sanford, he is apparently going to come and consult on patient tonight. 2. Urine lytes 3. Strict intake and output 4. IVF: 3L NS bolus in ED, will switch to isotonic bicarb at 125 cc/hr 5. Repeat BMP in AM 6. Stop Ibuprofen 2. UTI - 1. Rocephin 2. Culture pending 3. Metabolic acidosis - 1. ABG pending 2. Lactic acid level only 0.7 so this has been ruled out 3. No keytones in urine, BGL 129, doesn't smell like keytones, ketoacidosis seems unlikely; will check beta-hydroxybuteric acid level to exclude this however. 4. In essence, I am concerned that I am looking at an RTA due to chronic interstitial nephritis (nephrology consulting as above). 4. Generalized weakness - 1. Suspect this secondary to metabolic issues as described above 2. Cord syndromes such as cauda equina were considered, but seem less likely given: 1. Lack of urinary retention / neurogenic bladder with only 400cc UOP on foley placement 2. Lack of BLE pain  3. Lack of BLE sensory changes or saddle anesthesia 4. None the less, if no improvement with treatment of metabolic derangements overnight, may wish to consider MRI tomorrow to prove this. 5. Oblique IUD - 1. Curb side OB/GYN  in AM to see if theres anything they need to do for this.  DVT prophylaxis: Heparin [place] Code Status: Full Family Communication: Family at bedside Disposition Plan: Home after admit Consults called: Dr. Joelyn Oms Admission status: Admit to inpatient - IP status for acute kidney failure with creat nearly 5 times baseline in this patient who cannot ambulate!   Etta Quill DO [facility] Pager [phone] Only works nights!  If 7AM-7PM, please contact the primary day team physician taking care of patient  [email or web address] Password [email or web address]  12/24/2017, 12:49 AM

## 2017-12-24 NOTE — Consult Note (Signed)
Chief Complaint: Patient was seen in consultation today for Left percutaneous nephrostomy tube placement Chief Complaint  Patient presents with  . Vaginal Bleeding  . Urinary Tract Infection   at the request of Dr Tresa Moore  Supervising Physician: Jacqulynn Cadet  Patient Status: [facility] - In-pt  History of Present Illness: ITSEL OPFER is a 52 y.o. 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 No hydronephrosis. Nondependent gas in the bladder, correlate for cystitis.  Urology has reviewed imaging and chart-- requesting L PCN- non emergently   Dr Vernard Gambles has reviewed imaging and has approved procedure for 10/5  Past Medical History:  Diagnosis Date  . Abnormal Pap smear   . Anxiety   . Atrial fibrillation ([place])   . Bipolar 1 disorder ([place])   . Genital warts    Hx of genital  . Hypertension     Past Surgical History:  Procedure Laterality Date  . CERVICAL CONE BIOPSY    . CESAREAN SECTION    . DILATION AND CURETTAGE OF UTERUS    . OVARIAN CYST REMOVAL    . UNILATERAL SALPINGECTOMY     Pt unsure of which fallopian tube was removed    Allergies: Sulfa antibiotics; Latex; and Tape  Medications: Prior to Admission medications   Medication Sig Start Date End Date Taking? Authorizing Provider  ibuprofen (ADVIL,MOTRIN) 200 MG tablet Take 1,000 mg by mouth every 4 (four) hours as needed (FOR PAIN).    Yes [provider]  Multiple Vitamins-Minerals (ONE-A-DAY WOMENS 50+ ADVANTAGE) TABS Take 1 tablet by mouth daily.   Yes [provider]  ALPRAZolam Duanne Moron) 0.5 MG tablet Take 0.5 mg by mouth 4 (four) times daily.    [provider]  bisoprolol-hydrochlorothiazide (ZIAC)  10-6.25 MG per tablet Take 1 tablet by mouth daily.    [provider]  butalbital-acetaminophen-caffeine (FIORICET WITH CODEINE) 50-325-40-30 MG per capsule Take 2 capsules by mouth 3 (three) times daily.    [provider]  divalproex (DEPAKOTE ER) 500 MG 24 hr tablet Take 2,000 mg by mouth daily.    [provider]  lamoTRIgine (LAMICTAL) 100 MG tablet Take 100 mg by mouth daily.    [provider]  losartan (COZAAR) 25 MG tablet Take 25 mg by mouth daily.    [provider]     Family History  Problem Relation Age of Onset  . Diabetes Paternal Grandfather   . COPD Paternal Grandfather   . Heart disease Paternal Grandfather   . Cancer Paternal Grandmother   . Heart disease Paternal Grandmother   . Cancer Father   . Hypertension Father   . Heart attack Father   . Heart failure Mother   . Diabetes Mother   . Heart disease Mother   . Hyperlipidemia Mother   . Hypertension Mother   . Heart attack Mother   . Peripheral vascular disease Mother   . Hypertension Sister   . Heart attack Sister     Social History   Socioeconomic History  . Marital status: Married    Spouse name: Not on file  . Number of children: Not on file  . Years of education: Not on file  . Highest education level: Not on file  Occupational History  . Not on file  Social Needs  . Financial resource strain: Not on file  . Food insecurity:    Worry: Not on file    Inability: Not on file  . Transportation needs:    Medical: Not on file    Non-medical: Not on file  Tobacco Use  . Smoking status: Current Every Day Smoker    Packs/day: 1.00    Years: 32.00    Pack years: 32.00    Types: Cigarettes  . Smokeless tobacco: Never Used  Substance and Sexual Activity  . Alcohol use: No    Alcohol/week: 0.0 standard drinks  . Drug use: No  . Sexual activity: Yes    Birth control/protection: IUD  Lifestyle  . Physical activity:    Days per week: Not on file     Minutes per session: Not on file  . Stress: Not on file  Relationships  . Social connections:    Talks on phone: Not on file    Gets together: Not on file    Attends religious service: Not on file    Active member of club or organization: Not on file    Attends meetings of clubs or organizations: Not on file    Relationship status: Not on file  Other Topics Concern  . Not on file  Social History Narrative  . Not on file    Review of Systems: A 12 point ROS discussed and pertinent positives are indicated in the HPI above.  All other systems are negative.  Review of Systems  Constitutional: Positive for activity change, appetite change and fatigue. Negative for fever.  Respiratory: Positive for shortness of breath.   Cardiovascular: Negative for chest pain.  Gastrointestinal: Positive for abdominal pain.  Skin: Positive for pallor.  Neurological: Positive for weakness.  Psychiatric/Behavioral: Negative for behavioral problems and confusion.    Vital Signs: BP (!) 74/55   Pulse 76   Temp 97.9 F (36.6 C) (Oral)   Resp 18   Ht 5' 3"  (1.6 m)   Wt 275 lb (124.7 kg)   SpO2 100%   BMI 48.71 kg/m   Physical Exam  Constitutional: She is oriented to person, place, and time.  Cardiovascular: Normal rate, regular rhythm and normal heart sounds.  Pulmonary/Chest: Effort normal and breath sounds normal.  Abdominal: Soft. Bowel sounds are normal.  Musculoskeletal: Normal range of motion.  Neurological: She is alert and oriented to person, place, and time.  Skin: Skin is warm. There is pallor.  Psychiatric: She has a normal mood and affect. Her behavior is normal. Judgment and thought content normal.  Vitals reviewed.   Imaging: Dg Chest 2 View  Result Date: 12/23/2017 CLINICAL DATA:  Dysuria, weakness EXAM: CHEST - 2 VIEW COMPARISON:  None. FINDINGS: Lungs are clear.  No pleural effusion or pneumothorax. The heart is normal in size. Visualized osseous structures are within  normal limits. IMPRESSION: Normal chest radiographs. Electronically Signed   By: Julian Hy M.D.   On: 12/23/2017 22:06   Ct Renal Stone Study  Result Date: 12/23/2017 CLINICAL DATA:  Dysuria, weakness EXAM: CT ABDOMEN AND PELVIS WITHOUT CONTRAST TECHNIQUE: Multidetector CT imaging of the abdomen and pelvis was performed following the standard protocol without IV contrast. COMPARISON:  12/18/2013 FINDINGS: Lower chest: Lung bases are clear. Hepatobiliary: Mild hepatic steatosis. Layering tiny gallstones (series 3/image 29), without associated inflammatory changes. No intrahepatic or extrahepatic ductal dilatation. Pancreas: Within normal limits. Spleen: Within normal limits. Adrenals/Urinary Tract: 2.9 cm right adrenal adenoma (series 3/image 19), previously 2.2  cm. Left adrenal gland is within normal limits. Right kidney is notable for a 5 mm nonobstructing right lower pole renal calculus (series 3/image 41). Vascular calcifications. No hydronephrosis. 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 Layering calcifications in a left lower pole calyceal diverticulum (series 3/image 39). No hydronephrosis. Bladder is notable for nondependent gas. Stomach/Bowel: Stomach is within normal limits. No evidence of bowel obstruction. Normal appendix (series 3/image 58). Sigmoid diverticulosis, without evidence of diverticulitis. Vascular/Lymphatic: No evidence of abdominal aortic aneurysm. Prominent para-aortic nodes measuring up to 10 mm short axis (series 3/image 34), at the upper limits of normal. Reproductive: Uterine IUD, mildly obliqued, with the right arm possibly embedded in the myometrium (series 3/image 69). Bilateral ovaries are unremarkable. Other: No abdominopelvic ascites. Tiny fat containing periumbilical hernia (series 3/image 62). Musculoskeletal: Mild degenerative changes of the  visualized thoracolumbar spine. 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 No hydronephrosis. Nondependent gas in the bladder, correlate for cystitis. No evidence of bowel obstruction. Normal appendix. Sigmoid diverticulosis, without evidence of diverticulitis. Cholelithiasis, without associated inflammatory changes. Uterine IUD may be mildly obliqued, with its right arm possibly embedded in the myometrium. Electronically Signed   By: Julian Hy M.D.   On: 12/23/2017 23:57    Labs:  CBC: Recent Labs    12/23/17 2123 12/24/17 0500  WBC 20.2* 17.2*  HGB 15.2* 12.3  HCT 45.9 36.3  PLT 381 304    COAGS: Recent Labs    12/23/17 2123  INR 1.38    BMP: Recent Labs    12/23/17 2123 12/24/17 0500 12/24/17 0708 12/24/17 0942  NA 130* 137 138 137  K 4.5 2.3* 2.5* 2.7*  CL 100 110 104 108  CO2 <7* 8* 9* 8*  GLUCOSE 129* 83 79 74  BUN 84* 74* 76* 74*  CALCIUM 8.0* 6.8* 6.8* 6.6*  CREATININE 5.15* 3.89* 3.92* 3.60*  GFRNONAA 9* 12* 12* 14*  GFRAA 10* 14* 14* 16*    LIVER FUNCTION TESTS: Recent Labs    12/23/17 2123  BILITOT 1.5*  AST 19  ALT 17  ALKPHOS 89  PROT 6.6  ALBUMIN 3.1*    TUMOR MARKERS: No results for input(s): AFPTM, CEA, CA199, CHROMGRNA in the last 8760 hours.  Assessment and Plan:  Nephrolithiasis Hypotension Sepsis Request for Left Percutaneous nephrostomy placement for possible future intervention Risks and benefits of Left PCN were discussed with the patient including, but not limited to, infection, bleeding, significant bleeding causing loss or decrease in renal function or damage to adjacent structures.   All of the patient's questions were answered, patient is agreeable to proceed.  Consent signed and in chart.  Thank you for this interesting consult.  I greatly enjoyed meeting ZYRA PARRILLO and look forward to participating in their  care.  A copy of this report was sent to the requesting provider on this date.  Electronically Signed: Estevan Kersh A, PA-C 12/24/2017, 1:50 PM   I spent a total of 40 Minutes    in face to face in clinical consultation, greater than 50% of which was counseling/coordinating care for L PCN

## 2017-12-24 NOTE — Progress Notes (Signed)
New Admission Note:  Arrival Method: Stretcher Mental Orientation: Alert and oriented x 4 Telemetry: Box 22 NSR Assessment: Completed Skin: Warm and dry. MSAD to buttocks and folds, BLE dry and flaky  IV: NSL X 2 Pain: Denies  Tubes: Foley cath  Safety Measures: Safety Fall Prevention Plan initiated.  Admission: Completed 5 M  Orientation: Patient has been orientated to the room, unit and the staff. Welcome booklet given.  Family: Dtr  Orders have been reviewed and implemented. Will continue to monitor the patient. Call light has been placed within reach and bed alarm has been activated.   Sima Matas BSN, RN  Phone Number: [phone]

## 2017-12-24 NOTE — Consult Note (Signed)
Pamela Carney Admit Date: 12/23/2017 12/24/2017 Pamela Carney Requesting Physician: Alcario Drought  Reason for Consult: Acute on chronic renal failure, acidosis HPI:  52 year old female who presented to the emergency room with chief complaints diffuse pain, fatigue, malaise, headaches,  chronic dysuria and hematuria with progressive weakness and unsteady gait.  PMH Incudes:  Chronic migraine headaches   Bipolar disorder on lamotrigine, divalproex  Hypertension on bisoprolol/HCTZ, losartan  Hx/o nephrolithiasis, saw urology in past and told needs procedure but pt did not f/u  To treat her headaches, patient endorses taking heavy doses of ibuprofen for years, reporting 1 g 4 times a day.  Says in past week she took entire bottle 500 200mg  pills.   Emergency work-up revealed acute on chronic renal failure with a creatinine of 5.1 compared to 1.3 approximately 4 years ago.  Significant metabolic acidosis with arterial pH of 7.066, PaCO2 below reportable range, and serum bicarbonate less than 7 with an uncalculable but elevated anion gap.  Urine analysis with 2+ proteinuria, heavy hematuria hematuria and pyuria.  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  No hydronephrosis bilaterally.  Gas was present in the bladder. WBC elevated, no eosinophilia.   UA without ketones.  Venous lactic acid is not elevated. Denies use of aspirin, tylenol.  Denies taking in antifreeze or other liquids.    Treatment thus far consist of ceftriaxone to treat potential GU infection, initiation of sodium bicarbonate 150 mEq at 125 mL/h, and patient was given 2 L of normal saline bolus.   Creatinine, Ser (mg/dL)  Date Value  12/23/2017 5.15 (H)  10/05/2013 1.28 (H)  03/31/2010 1.0  ] I/Os:  ROS Balance of 12 systems is negative w/ exceptions as above  PMH  Past Medical  History:  Diagnosis Date  . Abnormal Pap smear   . Anxiety   . Atrial fibrillation ([place])   . Bipolar 1 disorder ([place])   . Genital warts    Hx of genital  . Hypertension    PSH  Past Surgical History:  Procedure Laterality Date  . CERVICAL CONE BIOPSY    . CESAREAN SECTION    . DILATION AND CURETTAGE OF UTERUS    . OVARIAN CYST REMOVAL    . UNILATERAL SALPINGECTOMY     Pt unsure of which fallopian tube was removed   FH  Family History  Problem Relation Age of Onset  . Diabetes Paternal Grandfather   . COPD Paternal Grandfather   . Heart disease Paternal Grandfather   . Cancer Paternal Grandmother   . Heart disease Paternal Grandmother   . Cancer Father   . Hypertension Father   . Heart attack Father   . Heart failure Mother   . Diabetes Mother   . Heart disease Mother   . Hyperlipidemia Mother   . Hypertension Mother   . Heart attack Mother   . Peripheral vascular disease Mother   . Hypertension Sister   . Heart attack Sister    [name]  reports that she has been smoking cigarettes. She has a 32.00 pack-year smoking history. She has never used smokeless tobacco. She reports that she does not drink alcohol or use drugs. Allergies  Allergies  Allergen Reactions  . Sulfa Antibiotics Hives and Swelling    Swelling site not recalled  . Latex Rash  . Tape Rash    Not tolerated well  Home medications Prior to Admission medications   Medication Sig Start Date End Date Taking? Authorizing Provider  ibuprofen (ADVIL,MOTRIN) 200 MG tablet Take 1,000 mg by mouth every 4 (four) hours as needed (FOR PAIN).    Yes [provider]  Multiple Vitamins-Minerals (ONE-A-DAY WOMENS 50+ ADVANTAGE) TABS Take 1 tablet by mouth daily.   Yes [provider]  ALPRAZolam Duanne Moron) 0.5 MG tablet Take 0.5 mg by mouth 4 (four) times daily.    [provider]  bisoprolol-hydrochlorothiazide (ZIAC) 10-6.25 MG per tablet Take 1 tablet by mouth daily.    [provider]  butalbital-acetaminophen-caffeine (FIORICET WITH CODEINE) 50-325-40-30 MG per capsule Take 2 capsules by mouth 3 (three) times daily.    [provider]  divalproex (DEPAKOTE ER) 500 MG 24 hr tablet Take 2,000 mg by mouth daily.    [provider]  lamoTRIgine (LAMICTAL) 100 MG tablet Take 100 mg by mouth daily.    [provider]  losartan (COZAAR) 25 MG tablet Take 25 mg by mouth daily.    [provider]    Current Medications Scheduled Meds: . heparin  5,000 Units Subcutaneous Q8H  .  morphine injection  4 mg Intravenous Once  . ondansetron (ZOFRAN) IV  4 mg Intravenous Once   Continuous Infusions: .  sodium bicarbonate (isotonic) infusion in sterile water 125 mL/hr at 12/24/17 0103   PRN Meds:.acetaminophen **OR** acetaminophen, ondansetron **OR** ondansetron (ZOFRAN) IV  CBC Recent Labs  Lab 12/23/17 2123  WBC 20.2*  NEUTROABS 17.3*  HGB 15.2*  HCT 45.9  MCV 94.1  PLT 621   Basic Metabolic Panel Recent Labs  Lab 12/23/17 2123  NA 130*  K 4.5  CL 100  CO2 <7*  GLUCOSE 129*  BUN 84*  CREATININE 5.15*  CALCIUM 8.0*    Physical Exam  Blood pressure 101/73, pulse 77, temperature 98.4 F (36.9 C), temperature source Oral, resp. rate 15, height 5\' 3"  (1.6 m), weight 124.7 kg, SpO2 99 %. GEN: chronically ill appearing, NAD ENT: NCAT, poor dentition EYES: EOMI CV: RRR PULM: CTAB ABD: protuberant, s/nt, quiet BS SKIN: no rashes/lesions, several tattos EXT:No LEE NEURO: AAO x3, no asterixus   Assessment 80F with AoCKD3 and severe inc AG metabolic acidosis  1. AoCKD3 likely from acute and chronic NSAID use (either hemodynamic ATN and/or AIN type)   2. Severe metabolic acidosis with inc AG, lactae WNL, urine ketones negative. ? If related #1 (but worry about another cause as well) 3. B/l nephrolithiasis including large L sided stones 4. Chronic heavy NSAID use 5. Migraines 6. BPAD  Plan 1. Cont NaHCO3 159mEq @  166mL/hr 2. Give 1 amp NaHCO3 as well 3. Check salicylates, serum osmolality, toxic alcohols 4. q4h BMP for now 5. If does not improve rapidly will need HD to correct acidosis 6. NPO for possible HD cath placement 7. Stop all NSAIDs 8. Likely will need urology involvement for staghorn stone, with infection 9. Hold ARB, all BP meds for now   Pearson Grippe MD [phone] pgr 12/24/2017, 1:54 AM

## 2017-12-24 NOTE — Progress Notes (Signed)
eLink Physician-Brief Progress Note Patient Name: Pamela Carney DOB: Feb 12, 1966 MRN: [id number]   Date of Service  12/24/2017  HPI/Events of Note  Hypotension with BP of 76/57 (65) in the setting of AKI but UOP of greater than 2 liters since 701 this AM. Also with hypoglycemia on sterile water bicarb infusion.  eICU Interventions  Plan: 500 cc bolus for NS for BP support Change base of bicarb infusion to D5W Continue to monitor BP and Blood sugar     Intervention Category Intermediate Interventions: Hypotension - evaluation and management;Other:  [name] 12/24/2017, 8:35 PM

## 2017-12-24 NOTE — Progress Notes (Signed)
[facility] Progress Note   Subjective:   C/o pain at Foley site, thirst and mild dizziness.   I/Os since admission 3.8 / 2.1 UOP 2.05L  Objective Vitals:   12/24/17 0157 12/24/17 0549 12/24/17 0833 12/24/17 0841  BP: (!) 117/52 (!) 82/58 (!) 75/54 (!) 78/52  Pulse: 75 77 82   Resp: 20 20 18    Temp: 97.8 F (36.6 C) 98.6 F (37 C) 98.2 F (36.8 C)   TempSrc: Oral Oral Oral   SpO2: 100% 98% 98%   Weight:      Height:       Physical Exam General: chronically ill appearing, nontoxic but a bit lethargic CV: RRR, II/VI syst murmur, thready radial pulse; Manual BP during exam 78/52.  Lungs: clear  Abdomen: soft, nontender Extremities: no edema Skin: dry, no rashes, warm and not mottled Neuro: oriented, conversant GU: foley draining cloudy urine  Additional Objective Labs: Basic Metabolic Panel: Recent Labs  Lab 12/23/17 2123 12/24/17 0500 12/24/17 0708  NA 130* 137 138  K 4.5 2.3* 2.5*  CL 100 110 104  CO2 <7* 8* 9*  GLUCOSE 129* 83 79  BUN 84* 74* 76*  CREATININE 5.15* 3.89* 3.92*  CALCIUM 8.0* 6.8* 6.8*   Liver Function Tests: Recent Labs  Lab 12/23/17 2123  AST 19  ALT 17  ALKPHOS 89  BILITOT 1.5*  PROT 6.6  ALBUMIN 3.1*   No results for input(s): LIPASE, AMYLASE in the last 168 hours. CBC: Recent Labs  Lab 12/23/17 2123 12/24/17 0500  WBC 20.2* 17.2*  NEUTROABS 17.3*  --   HGB 15.2* 12.3  HCT 45.9 36.3  MCV 94.1 91.2  PLT 381 304   Blood Culture    Component Value Date/Time   SDES BLOOD LEFT ARM 12/24/2017 0500   SPECREQUEST  12/24/2017 0500    BOTTLES DRAWN AEROBIC ONLY Blood Culture results may not be optimal due to an inadequate volume of blood received in culture bottles   CULT  12/24/2017 0500    NO GROWTH <12 HOURS Performed at [facility] [address] [place], [place] [address]    REPTSTATUS PENDING 12/24/2017 0500    Cardiac Enzymes: No results for input(s): CKTOTAL, CKMB, CKMBINDEX, TROPONINI in  the last 168 hours. CBG: No results for input(s): GLUCAP in the last 168 hours. Iron Studies: No results for input(s): IRON, TIBC, TRANSFERRIN, FERRITIN in the last 72 hours. @lablastinr3 @ Studies/Results: Dg Chest 2 View  Result Date: 12/23/2017 CLINICAL DATA:  Dysuria, weakness EXAM: CHEST - 2 VIEW COMPARISON:  None. FINDINGS: Lungs are clear.  No pleural effusion or pneumothorax. The heart is normal in size. Visualized osseous structures are within normal limits. IMPRESSION: Normal chest radiographs. Electronically Signed   By: Julian Hy M.D.   On: 12/23/2017 22:06   Ct Renal Stone Study  Result Date: 12/23/2017 CLINICAL DATA:  Dysuria, weakness EXAM: CT ABDOMEN AND PELVIS WITHOUT CONTRAST TECHNIQUE: Multidetector CT imaging of the abdomen and pelvis was performed following the standard protocol without IV contrast. COMPARISON:  12/18/2013 FINDINGS: Lower chest: Lung bases are clear. Hepatobiliary: Mild hepatic steatosis. Layering tiny gallstones (series 3/image 29), without associated inflammatory changes. No intrahepatic or extrahepatic ductal dilatation. Pancreas: Within normal limits. Spleen: Within normal limits. Adrenals/Urinary Tract: 2.9 cm right adrenal adenoma (series 3/image 19), previously 2.2 cm. Left adrenal gland is within normal limits. Right kidney is notable for a 5 mm nonobstructing right lower pole renal calculus (series 3/image 41). Vascular calcifications. No hydronephrosis. Left  kidney is notable for multiple calculi, including a 2.2 cm lower pole partial staghorn calculus and a 12 mm calculus in the left proximal collecting system. Layering calcifications in a left lower pole calyceal diverticulum (series 3/image 39). No hydronephrosis. Bladder is notable for nondependent gas. Stomach/Bowel: Stomach is within normal limits. No evidence of bowel obstruction. Normal appendix (series 3/image 58). Sigmoid diverticulosis, without evidence of diverticulitis.  Vascular/Lymphatic: No evidence of abdominal aortic aneurysm. Prominent para-aortic nodes measuring up to 10 mm short axis (series 3/image 34), at the upper limits of normal. Reproductive: Uterine IUD, mildly obliqued, with the right arm possibly embedded in the myometrium (series 3/image 69). Bilateral ovaries are unremarkable. Other: No abdominopelvic ascites. Tiny fat containing periumbilical hernia (series 3/image 62). Musculoskeletal: Mild degenerative changes of the visualized thoracolumbar spine. 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 No hydronephrosis. Nondependent gas in the bladder, correlate for cystitis. No evidence of bowel obstruction. Normal appendix. Sigmoid diverticulosis, without evidence of diverticulitis. Cholelithiasis, without associated inflammatory changes. Uterine IUD may be mildly obliqued, with its right arm possibly embedded in the myometrium. Electronically Signed   By: Julian Hy M.D.   On: 12/23/2017 23:57   Medications: . potassium chloride 10 mEq (12/24/17 0639)  . potassium chloride    .  sodium bicarbonate (isotonic) infusion in sterile water 125 mL/hr at 12/24/17 0103  . sodium chloride     . heparin  5,000 Units Subcutaneous Q8H  .  morphine injection  4 mg Intravenous Once  . ondansetron (ZOFRAN) IV  4 mg Intravenous Once    Dialysis Orders:  Assessment/Plan: 51F with AoCKD3 and severe inc AG metabolic acidosis  1. AoCKD3 likely from acute and chronic NSAID use (either hemodynamic ATN and/or AIN type), nonoliguric, cr improved from 5.15 on admission 21:30 to 3.9 07:00.  2. Hypotension suspect hypovolemia + sepsis 3. Severe metabolic acidosis with inc AG, lactae WNL, urine ketones negative. Suspect AG multifactorial with starvation ketoacidosis (elevated beta hydroxybutyric acid) + AKI.  Improving slowly with aggressive inteventions (bicarb up from  undetectable to 9) 4. B/l nephrolithiasis including large L sided stones 5. Chronic heavy NSAID use 6. Migraines 7. BPAD 8. Severe hypokalemia  Discussed in detail with RN at bedside this morning.  Will follow up with primary MD after ABG returns.   1.  NS 1L bolus now for hypotension 2.  ABG now - if still profound acidosis will give another amp bicarb 3.  Continue NaHCO3 185mEq @ 1100mL/hr 4.  Consider broadening antibiotics in light of worsening clinical status 5.  Continue NPO in case dialysis catheter required, but with her maintaining UOP at this point I think it's reasonable to continue to medically manage this issue.  Will follow closely.  6.  Continue q4h BMPs for now 7.  Likely will need urology involvement for staghorn stone, with infection 8.  Hold ARB, all BP meds for now   9.  Agree with KCl repletion IV for now  Jannifer Hick MD 12/24/2017, 8:45 AM  [place] Pager: [phone]

## 2017-12-24 NOTE — Significant Event (Signed)
Rapid Response Event Note  Overview: Time Called: 1101 Arrival Time: 1106 Event Type: Hypotension  Initial Focused Assessment: Per notes AKI and UTI Per patient she has been feeling poorly for weeks and hasn't been eating or drinking much.  She also states she stopped taking prescribed medication about 3 years ago.  Takes ibuprofen daily for pain. She is alert and oriented, but feels very weak and is dizzy when sitting upright in the bed, some nausea.  She endorsed abdominal pain Pink tinged urine BP 82/44  SR 71  RR 18  O2 sats 100% on RA  Interventions: Bicarb gtt 164meq/1L at 125cc/hr 1 amp Bicarb given IVP Replacing Potassium  (10 Meq/100cc) per hour 1L NS bolus Foley with uop 925cc over last 2 1/5 hours  BP rechecked post 500cc  72/47 SR 71   BP checked automatic and manual bilaterally: correlates.  Dr Sarajane Jews notified of pt status and VS  1230: BP post 1L NS 73/24 1245: BP 54/17  Pt sleeping 1300: BP 68/26  Pt awake and talking,  Continues to be alert and oriented, feeling weak and tired.  1L NS bolus  BP 74/55  SR 73  1400 BP 60/50  HR 67   1L NS bolus Foley with 350cc uop  Dr Nelda Marseille at bedside to assess patient.  Plan transfer to ICU  1440  Patient transported to 2M10 staff at bedside. Total 4L NS given since 1130 am    Plan of Care (if not transferred):  Event Summary: Name of Physician Notified: Sarajane Jews at      at    Outcome: [phone])  Event End Time: [place]  Raliegh Ip

## 2017-12-25 ENCOUNTER — Encounter (HOSPITAL_COMMUNITY): Payer: Self-pay | Admitting: Interventional Radiology

## 2017-12-25 ENCOUNTER — Inpatient Hospital Stay (HOSPITAL_COMMUNITY): Payer: 59

## 2017-12-25 DIAGNOSIS — N3001 Acute cystitis with hematuria: Secondary | ICD-10-CM

## 2017-12-25 HISTORY — PX: IR NEPHROSTOMY PLACEMENT LEFT: IMG6063

## 2017-12-25 LAB — BASIC METABOLIC PANEL
ANION GAP: 15 (ref 5–15)
ANION GAP: 10 (ref 5–15)
BUN: 57 mg/dL — ABNORMAL HIGH (ref 6–20)
BUN: 47 mg/dL — AB (ref 6–20)
CO2: 15 mmol/L — AB (ref 22–32)
CO2: 19 mmol/L — ABNORMAL LOW (ref 22–32)
Calcium: 6.3 mg/dL — CL (ref 8.9–10.3)
Calcium: 6.4 mg/dL — CL (ref 8.9–10.3)
Chloride: 111 mmol/L (ref 98–111)
Chloride: 111 mmol/L (ref 98–111)
Creatinine, Ser: 2.35 mg/dL — ABNORMAL HIGH (ref 0.44–1.00)
Creatinine, Ser: 1.86 mg/dL — ABNORMAL HIGH (ref 0.44–1.00)
GFR calc Af Amer: 26 mL/min — ABNORMAL LOW (ref >60)
GFR calc non Af Amer: 23 mL/min — ABNORMAL LOW (ref >60)
GFR, EST AFRICAN AMERICAN: 35 mL/min — AB (ref >60)
GFR, EST NON AFRICAN AMERICAN: 30 mL/min — AB (ref >60)
GLUCOSE: 170 mg/dL — AB (ref 70–99)
Glucose, Bld: 263 mg/dL — ABNORMAL HIGH (ref 70–99)
POTASSIUM: 2.4 mmol/L — AB (ref 3.5–5.1)
POTASSIUM: 2.3 mmol/L — AB (ref 3.5–5.1)
SODIUM: 140 mmol/L (ref 135–145)
Sodium: 141 mmol/L (ref 135–145)

## 2017-12-25 LAB — CBC
HEMATOCRIT: 30.5 % — AB (ref 36.0–46.0)
Hemoglobin: 10.6 g/dL — ABNORMAL LOW (ref 12.0–15.0)
MCH: 30.9 pg (ref 26.0–34.0)
MCHC: 34.8 g/dL (ref 30.0–36.0)
MCV: 88.9 fL (ref 78.0–100.0)
Platelets: 315 10*3/uL (ref 150–400)
RBC: 3.43 MIL/uL — AB (ref 3.87–5.11)
RDW: 15.2 % (ref 11.5–15.5)
WBC: 9.5 10*3/uL (ref 4.0–10.5)

## 2017-12-25 LAB — GLUCOSE, CAPILLARY
GLUCOSE-CAPILLARY: 160 mg/dL — AB (ref 70–99)
GLUCOSE-CAPILLARY: 148 mg/dL — AB (ref 70–99)
GLUCOSE-CAPILLARY: 281 mg/dL — AB (ref 70–99)
GLUCOSE-CAPILLARY: 143 mg/dL — AB (ref 70–99)
Glucose-Capillary: 121 mg/dL — ABNORMAL HIGH (ref 70–99)
Glucose-Capillary: 137 mg/dL — ABNORMAL HIGH (ref 70–99)

## 2017-12-25 LAB — PROCALCITONIN: Procalcitonin: 0.17 ng/mL

## 2017-12-25 LAB — MAGNESIUM: Magnesium: 1.9 mg/dL (ref 1.7–2.4)

## 2017-12-25 IMAGING — US IR NEPHROSTOMY PLACEMENT LEFT
5 series · 8 of 8 positions shown · non-contrast
Comparison: none

CLINICAL DATA: Left nephrolithiasis

EXAM:
LEFT PERCUTANEOUS NEPHROSTOMY CATHETER PLACEMENT UNDER ULTRASOUND
AND FLUOROSCOPIC GUIDANCE
FLUOROSCOPY TIME:  2.6 minute; 327 [UN] DAP
TECHNIQUE: The procedure, risks (including but not limited to bleeding,
infection, organ damage ), Benefits, and alternatives were explained
to the patient. Questions regarding the procedure were encouraged
and answered. The patient understands and consents to the procedure.

[Series 1: ir nephrostomy placement left · 3 of 3 slices shown]
[im 1/3]
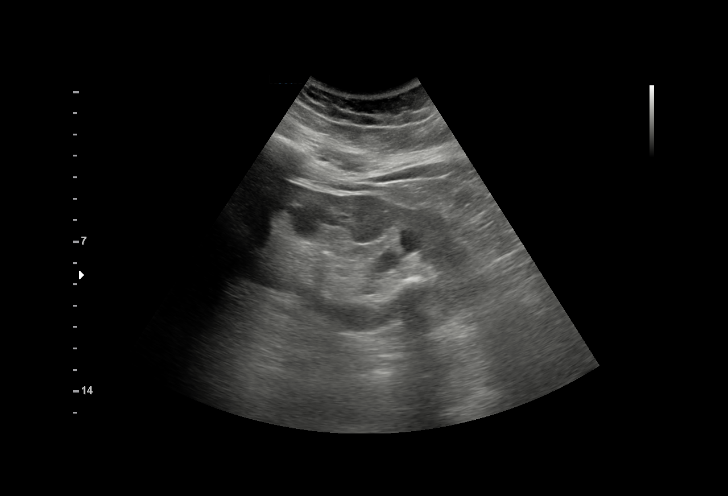
[im 2/3]
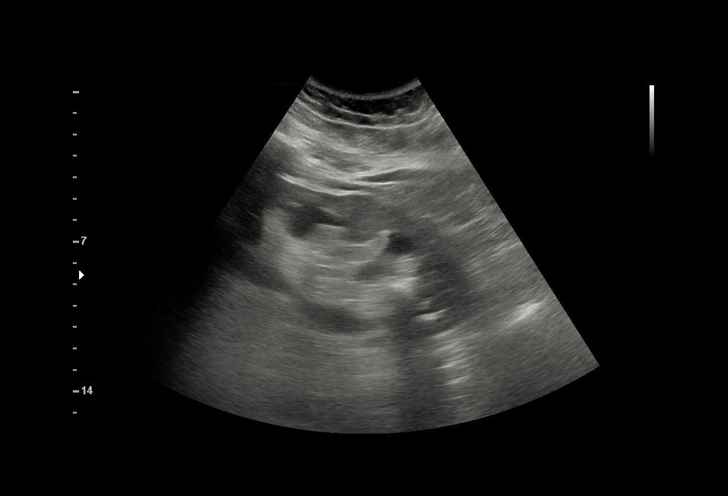
[im 3/3]
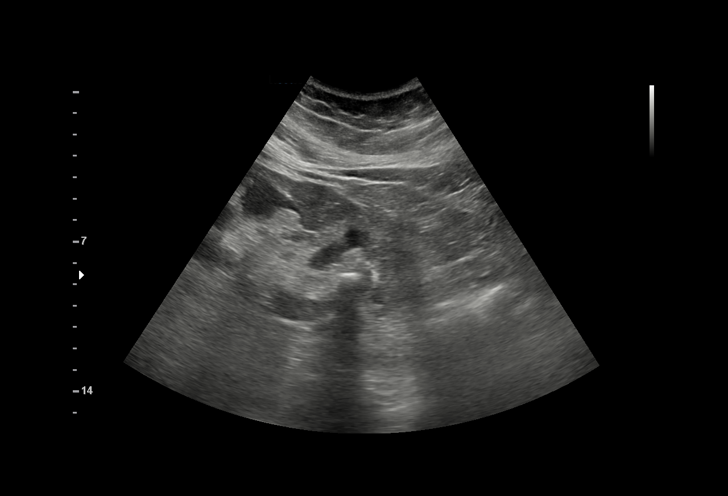

[Series 1: fl (-) angio · 1 of 1 slices shown (1 of 4)]
[im 1/1]
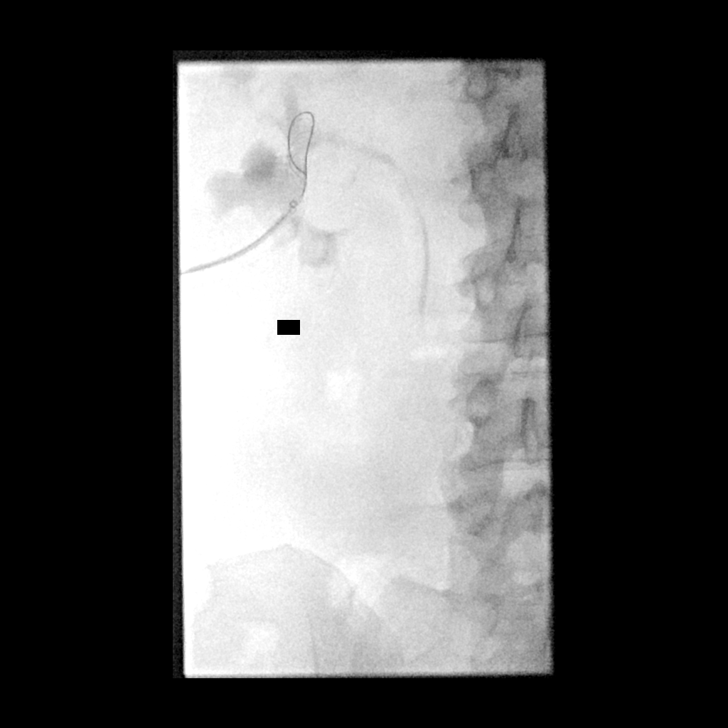

[Series 2: fl (-) angio · 1 of 1 slices shown (2 of 4)]
[im 1/1]
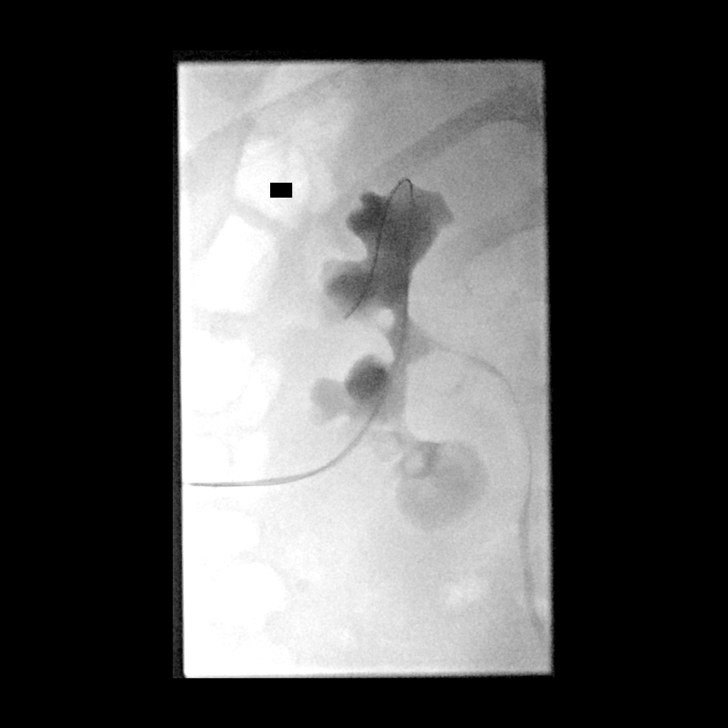

[Series 3: fl (-) angio · 1 of 1 slices shown (3 of 4)]
[im 1/1]
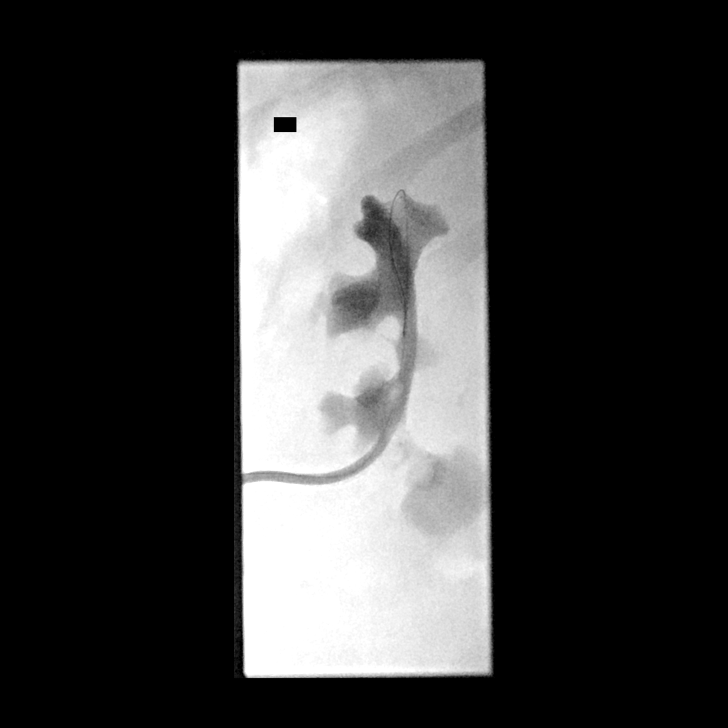

[Series 4: fl (-) angio · 2 of 2 slices shown (4 of 4)]
[im 1/2]
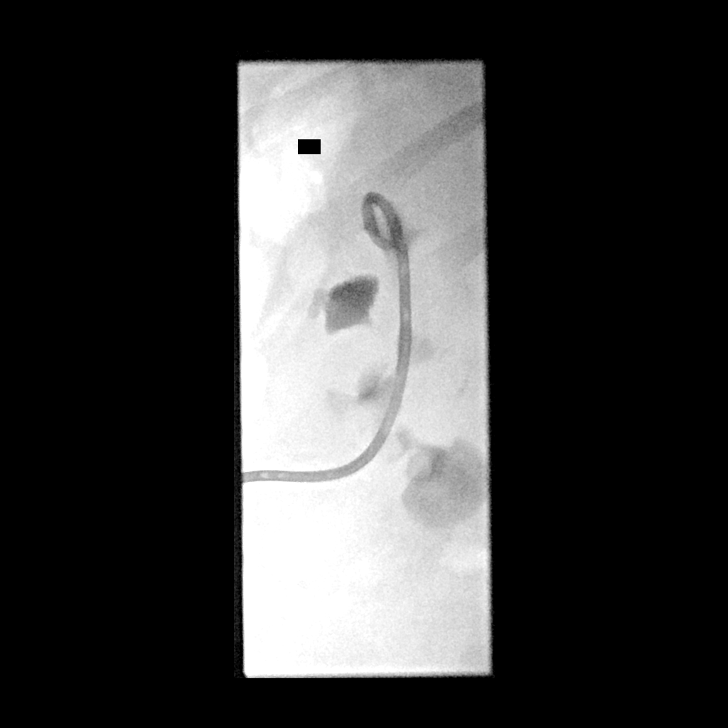
[im 2/2]
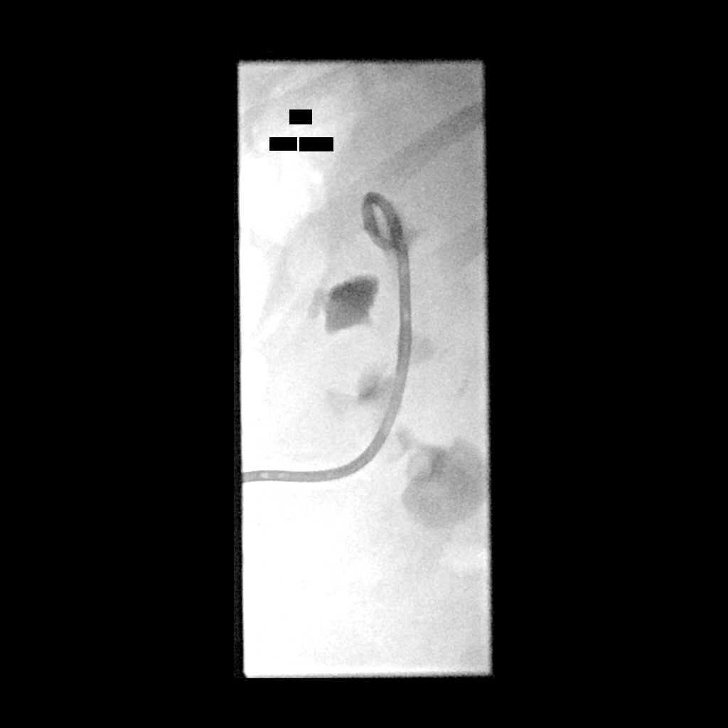

[8 of 8 positions shown; findings below may reference images not displayed]

Survey ultrasound of the left flank was performed, the kidney
localized, and an appropriate skin entry site determined. Leftflank
region prepped with Betadine, draped in usual sterile fashion,
infiltrated locally with 1% lidocaine.

Intravenous Fentanyl and Versed were administered as conscious
sedation during continuous monitoring of the patient's level of
consciousness and physiological / cardiorespiratory status by the
radiology RN, with a total moderate sedation time of 17 minutes.
Under real-time ultrasound guidance, a 21-gauge trocar needle was
advanced into a posterior lower pole calyx. Ultrasound image
documentation was saved. Cloudy urine spontaneously returned through
the needle. Needle was exchanged over a guidewire for transitional
dilator. Contrast injection confirmed appropriate positioning. A
small central renal collecting system was identified. Large lower
pole calyceal diverticulum corresponding to CT findings, containing
calculi. Proximal left ureter patent and nondilated. Catheter was
exchanged over a guidewire for a 10 AMIR SHAH pigtail
catheter, formed centrally within the upper pole moiety of the renal
collecting system. Contrast injection confirms appropriate
positioning and patency. Catheter secured externally with 0 Prolene
suture and StatLock, and placed to external drain bag. Urine sample
sent for Gram stain and culture.

COMPLICATIONS:
COMPLICATIONS
none
IMPRESSION: 1. Technically successful left percutaneous nephrostomy catheter
placement.
2. Left nephrolithiasis with attenuated central renal collecting
system.

## 2017-12-25 MED ORDER — POTASSIUM CHLORIDE 10 MEQ/100ML IV SOLN
10.0000 meq | INTRAVENOUS | Status: AC
  Administered 2017-12-25 – 2017-12-26 (×3): 10 meq via INTRAVENOUS
  Filled 2017-12-25 (×4): qty 100

## 2017-12-25 MED ORDER — AMLODIPINE BESYLATE 5 MG PO TABS
5.0000 mg | ORAL_TABLET | Freq: Every day | ORAL | Status: DC
  Administered 2017-12-25 – 2017-12-26 (×2): 5 mg via ORAL
  Filled 2017-12-25 (×2): qty 1

## 2017-12-25 MED ORDER — POTASSIUM CHLORIDE CRYS ER 20 MEQ PO TBCR
40.0000 meq | EXTENDED_RELEASE_TABLET | ORAL | Status: AC
  Administered 2017-12-25 (×2): 40 meq via ORAL
  Filled 2017-12-25 (×2): qty 2

## 2017-12-25 MED ORDER — SODIUM CHLORIDE 0.9 % IV SOLN: INTRAVENOUS | Status: DC | PRN

## 2017-12-25 MED ORDER — INSULIN ASPART 100 UNIT/ML ~~LOC~~ SOLN
0.0000 [IU] | Freq: Three times a day (TID) | SUBCUTANEOUS | Status: DC
  Administered 2017-12-25: 11 [IU] via SUBCUTANEOUS
  Administered 2017-12-26: 4 [IU] via SUBCUTANEOUS
  Administered 2017-12-26: 3 [IU] via SUBCUTANEOUS
  Administered 2017-12-26: 4 [IU] via SUBCUTANEOUS

## 2017-12-25 MED ORDER — HYDROCORTISONE NA SUCCINATE PF 100 MG IJ SOLR
50.0000 mg | Freq: Three times a day (TID) | INTRAMUSCULAR | Status: DC
  Administered 2017-12-25 – 2017-12-26 (×2): 50 mg via INTRAVENOUS
  Filled 2017-12-25 (×2): qty 2

## 2017-12-25 MED ORDER — MAGNESIUM SULFATE 2 GM/50ML IV SOLN
2.0000 g | Freq: Once | INTRAVENOUS | Status: AC
  Administered 2017-12-25: 2 g via INTRAVENOUS
  Filled 2017-12-25: qty 50

## 2017-12-25 MED ORDER — SODIUM CHLORIDE 0.9% FLUSH
5.0000 mL | Freq: Three times a day (TID) | INTRAVENOUS | Status: DC
  Administered 2017-12-27 – 2017-12-29 (×5): 5 mL

## 2017-12-25 MED ORDER — MIDAZOLAM HCL 2 MG/2ML IJ SOLN
INTRAMUSCULAR | Status: AC
  Filled 2017-12-25: qty 4

## 2017-12-25 MED ORDER — LIDOCAINE HCL 1 % IJ SOLN
INTRAMUSCULAR | Status: AC
  Filled 2017-12-25: qty 20

## 2017-12-25 MED ORDER — CIPROFLOXACIN IN D5W 400 MG/200ML IV SOLN
INTRAVENOUS | Status: AC
  Administered 2017-12-25: 400 mg via INTRAVENOUS
  Filled 2017-12-25: qty 200

## 2017-12-25 MED ORDER — POTASSIUM CHLORIDE 20 MEQ/15ML (10%) PO SOLN
40.0000 meq | Freq: Once | ORAL | Status: DC
  Filled 2017-12-25: qty 30

## 2017-12-25 MED ORDER — FENTANYL CITRATE (PF) 100 MCG/2ML IJ SOLN
INTRAMUSCULAR | Status: AC
  Filled 2017-12-25: qty 2

## 2017-12-25 MED ORDER — POTASSIUM CHLORIDE 10 MEQ/100ML IV SOLN
INTRAVENOUS | Status: AC
  Administered 2017-12-25: 10 meq
  Filled 2017-12-25: qty 100

## 2017-12-25 MED ORDER — SODIUM CHLORIDE 0.9 % IV SOLN
INTRAVENOUS | Status: DC
  Administered 2017-12-25 – 2017-12-26 (×3): via INTRAVENOUS

## 2017-12-25 MED ORDER — HYDROCODONE-ACETAMINOPHEN 5-325 MG PO TABS
1.0000 | ORAL_TABLET | ORAL | Status: DC | PRN
  Administered 2017-12-25: 2 via ORAL
  Administered 2017-12-25: 1 via ORAL
  Administered 2017-12-26 – 2017-12-28 (×4): 2 via ORAL
  Filled 2017-12-25: qty 2
  Filled 2017-12-25: qty 1
  Filled 2017-12-25 (×2): qty 2
  Filled 2017-12-25 (×2): qty 1
  Filled 2017-12-25 (×2): qty 2

## 2017-12-25 MED ORDER — IOPAMIDOL (ISOVUE-300) INJECTION 61%
INTRAVENOUS | Status: AC
  Administered 2017-12-25: 15 mL
  Filled 2017-12-25: qty 50

## 2017-12-25 MED ORDER — LIDOCAINE HCL (PF) 1 % IJ SOLN
INTRAMUSCULAR | Status: AC | PRN
  Administered 2017-12-25: 5 mL

## 2017-12-25 MED ORDER — MIDAZOLAM HCL 2 MG/2ML IJ SOLN
INTRAMUSCULAR | Status: AC | PRN
  Administered 2017-12-25 (×2): 1 mg via INTRAVENOUS

## 2017-12-25 MED ORDER — SODIUM CHLORIDE 0.9 % IV SOLN
1.0000 g | Freq: Two times a day (BID) | INTRAVENOUS | Status: DC
  Administered 2017-12-26: 1 g via INTRAVENOUS
  Filled 2017-12-25 (×3): qty 1

## 2017-12-25 MED ORDER — SODIUM BICARBONATE 650 MG PO TABS
650.0000 mg | ORAL_TABLET | Freq: Three times a day (TID) | ORAL | Status: DC
  Administered 2017-12-25 – 2017-12-26 (×2): 650 mg via ORAL
  Filled 2017-12-25 (×2): qty 1

## 2017-12-25 MED ORDER — INSULIN ASPART 100 UNIT/ML ~~LOC~~ SOLN: 0.0000 [IU] | Freq: Every day | SUBCUTANEOUS | Status: DC

## 2017-12-25 MED ORDER — FENTANYL CITRATE (PF) 100 MCG/2ML IJ SOLN
INTRAMUSCULAR | Status: AC | PRN
  Administered 2017-12-25 (×2): 50 ug via INTRAVENOUS

## 2017-12-25 MED ORDER — HEPARIN SODIUM (PORCINE) 5000 UNIT/ML IJ SOLN
5000.0000 [IU] | Freq: Three times a day (TID) | INTRAMUSCULAR | Status: DC
  Administered 2017-12-25 – 2017-12-29 (×13): 5000 [IU] via SUBCUTANEOUS
  Filled 2017-12-25 (×11): qty 1

## 2017-12-25 NOTE — Procedures (Signed)
  Procedure: Left perc nephrostomy 15f    EBL:   minimal Complications:  none immediate  See full dictation in [facility].  Dillard Cannon MD Main # [phone] Pager  [phone]

## 2017-12-25 NOTE — Progress Notes (Signed)
RT unable to placed arterial line.  2 RTS attempted twice on each side and was unsuccessful.  RN made aware.  RT will continue to monitor.

## 2017-12-25 NOTE — Procedures (Signed)
Arterial Catheter Insertion Procedure Note SHIRELL STRUTHERS [id number] 1966/01/12  Procedure: Insertion of Arterial Catheter  Indications: Blood pressure monitoring and Frequent blood sampling  Procedure Details Consent: Risks of procedure as well as the alternatives and risks of each were explained to the (patient/caregiver).  Consent for procedure obtained. Time Out: Verified patient identification, verified procedure, site/side was marked, verified correct patient position, special equipment/implants available, medications/allergies/relevent history reviewed, required imaging and test results available.  Performed  Maximum sterile technique was used including antiseptics, cap, gloves, gown, hand hygiene, mask and sheet. Skin prep: Chlorhexidine; local anesthetic administered Attempt made to left radial with ultrasound without success, proceeded to femoral approach.  53 gauge catheter was inserted into right femoral artery using the Seldinger technique. ULTRASOUND GUIDANCE USED: YES Evaluation Blood flow good; BP tracing good. Complications: No apparent complications.  Hayden Pedro, AGACNP-BC [facility]  Pgr: [phone]  PCCM Pgr: [phone]

## 2017-12-25 NOTE — Progress Notes (Addendum)
[facility] Progress Note   Subjective:  Moved to ICU yesterday due to refractory hypotension but lactate never elevated.  Did not require pressor therapy and now hypertensive on A line this morning.   She had perc nephrostomy tube placed L kidney this AM.   Urine culture with E coli. Blood cultures NGTD.   Objective Vitals:   12/25/17 1130 12/25/17 1200 12/25/17 1230 12/25/17 1300  BP:      Pulse: 60 (!) 56 (!) 58 (!) 55  Resp:   13 17  Temp:      TempSrc:      SpO2: 94% 93% 96% 96%  Weight:      Height:       Physical Exam General: chronically ill appearing, nontoxic but a bit lethargic CV: RRR, II/VI syst murmur Lungs: clear  Abdomen: soft, nontender Extremities: no edema Skin: dry, no rashes, warm and not mottled Neuro: oriented, conversant GU: foley draining cloudy urine  Additional Objective Labs: Basic Metabolic Panel: Recent Labs  Lab 12/24/17 0942 12/24/17 1324 12/25/17 0322  NA 137 140 141  K 2.7* 2.7* 2.4*  CL 108 112* 111  CO2 8* 9* 15*  GLUCOSE 74 61* 170*  BUN 74* 70* 57*  CREATININE 3.60* 3.30* 2.35*  CALCIUM 6.6* 6.0* 6.3*   Liver Function Tests: Recent Labs  Lab 12/23/17 2123  AST 19  ALT 17  ALKPHOS 89  BILITOT 1.5*  PROT 6.6  ALBUMIN 3.1*   No results for input(s): LIPASE, AMYLASE in the last 168 hours. CBC: Recent Labs  Lab 12/23/17 2123 12/24/17 0500 12/25/17 0322  WBC 20.2* 17.2* 9.5  NEUTROABS 17.3*  --   --   HGB 15.2* 12.3 10.6*  HCT 45.9 36.3 30.5*  MCV 94.1 91.2 88.9  PLT 381 304 315   Blood Culture    Component Value Date/Time   SDES BLOOD LEFT ARM 12/24/2017 0500   SPECREQUEST  12/24/2017 0500    BOTTLES DRAWN AEROBIC ONLY Blood Culture results may not be optimal due to an inadequate volume of blood received in culture bottles   CULT  12/24/2017 0500    NO GROWTH < 12 HOURS Performed at [facility] [address] [place], [place] [address]    REPTSTATUS PENDING 12/24/2017 0500     Cardiac Enzymes: No results for input(s): CKTOTAL, CKMB, CKMBINDEX, TROPONINI in the last 168 hours. CBG: Recent Labs  Lab 12/24/17 2037 12/24/17 2331 12/25/17 0329 12/25/17 0719 12/25/17 1123  GLUCAP 108* 153* 160* 148* 121*   Iron Studies: No results for input(s): IRON, TIBC, TRANSFERRIN, FERRITIN in the last 72 hours. @lablastinr3 @ Studies/Results: Dg Chest 2 View  Result Date: 12/23/2017 CLINICAL DATA:  Dysuria, weakness EXAM: CHEST - 2 VIEW COMPARISON:  None. FINDINGS: Lungs are clear.  No pleural effusion or pneumothorax. The heart is normal in size. Visualized osseous structures are within normal limits. IMPRESSION: Normal chest radiographs. Electronically Signed   By: Julian Hy M.D.   On: 12/23/2017 22:06   Ct Renal Stone Study  Result Date: 12/23/2017 CLINICAL DATA:  Dysuria, weakness EXAM: CT ABDOMEN AND PELVIS WITHOUT CONTRAST TECHNIQUE: Multidetector CT imaging of the abdomen and pelvis was performed following the standard protocol without IV contrast. COMPARISON:  12/18/2013 FINDINGS: Lower chest: Lung bases are clear. Hepatobiliary: Mild hepatic steatosis. Layering tiny gallstones (series 3/image 29), without associated inflammatory changes. No intrahepatic or extrahepatic ductal dilatation. Pancreas: Within normal limits. Spleen: Within normal limits. Adrenals/Urinary Tract: 2.9 cm right adrenal adenoma (series  3/image 19), previously 2.2 cm. Left adrenal gland is within normal limits. Right kidney is notable for a 5 mm nonobstructing right lower pole renal calculus (series 3/image 41). Vascular calcifications. No hydronephrosis. 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 Layering calcifications in a left lower pole calyceal diverticulum (series 3/image 39). No hydronephrosis. Bladder is notable for nondependent gas. Stomach/Bowel: Stomach is within  normal limits. No evidence of bowel obstruction. Normal appendix (series 3/image 58). Sigmoid diverticulosis, without evidence of diverticulitis. Vascular/Lymphatic: No evidence of abdominal aortic aneurysm. Prominent para-aortic nodes measuring up to 10 mm short axis (series 3/image 34), at the upper limits of normal. Reproductive: Uterine IUD, mildly obliqued, with the right arm possibly embedded in the myometrium (series 3/image 69). Bilateral ovaries are unremarkable. Other: No abdominopelvic ascites. Tiny fat containing periumbilical hernia (series 3/image 62). Musculoskeletal: Mild degenerative changes of the visualized thoracolumbar spine. 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 No hydronephrosis. Nondependent gas in the bladder, correlate for cystitis. No evidence of bowel obstruction. Normal appendix. Sigmoid diverticulosis, without evidence of diverticulitis. Cholelithiasis, without associated inflammatory changes. Uterine IUD may be mildly obliqued, with its right arm possibly embedded in the myometrium. Electronically Signed   By: Julian Hy M.D.   On: 12/23/2017 23:57   Ir Nephrostomy Placement Left  Result Date: 12/25/2017 CLINICAL DATA:  Left nephrolithiasis EXAM: LEFT PERCUTANEOUS NEPHROSTOMY CATHETER PLACEMENT UNDER ULTRASOUND AND FLUOROSCOPIC GUIDANCE FLUOROSCOPY TIME:  2.6 minute; 327 uGym2 DAP TECHNIQUE: The procedure, risks (including but not limited to bleeding, infection, organ damage ), Benefits, and alternatives were explained to the patient. Questions regarding the procedure were encouraged and answered. The patient understands and consents to the procedure. Survey ultrasound of the left flank was performed, the kidney localized, and an appropriate skin entry site determined. Leftflank region prepped with Betadine, draped in usual sterile fashion, infiltrated locally with 1%  lidocaine. Intravenous Fentanyl and Versed were administered as conscious sedation during continuous monitoring of the patient's level of consciousness and physiological / cardiorespiratory status by the radiology RN, with a total moderate sedation time of 17 minutes. Under real-time ultrasound guidance, a 21-gauge trocar needle was advanced into a posterior lower pole calyx. Ultrasound image documentation was saved. Cloudy urine spontaneously returned through the needle. Needle was exchanged over a guidewire for transitional dilator. Contrast injection confirmed appropriate positioning. A small central renal collecting system was identified. Large lower pole calyceal diverticulum corresponding to CT findings, containing calculi. Proximal left ureter patent and nondilated. Catheter was exchanged over a guidewire for a 10 French Dawson Mueller pigtail catheter, formed centrally within the upper pole moiety of the renal collecting system. Contrast injection confirms appropriate positioning and patency. Catheter secured externally with 0 Prolene suture and StatLock, and placed to external drain bag. Urine sample sent for Gram stain and culture. COMPLICATIONS: COMPLICATIONS none IMPRESSION: 1. Technically successful left percutaneous nephrostomy catheter placement. 2. Left nephrolithiasis with attenuated central renal collecting system. Electronically Signed   By: Lucrezia Europe M.D.   On: 12/25/2017 10:30   Medications: . sodium chloride    . sodium chloride 125 mL/hr at 12/25/17 1001  . cefTAZidime (FORTAZ)  IV Stopped (12/24/17 1804)   . amLODipine  5 mg Oral Daily  . calcium carbonate  800 mg of elemental calcium Oral TID  . chlorhexidine  15 mL  Mouth Rinse BID  . heparin injection (subcutaneous)  5,000 Units Subcutaneous Q8H  . hydrocortisone sodium succinate  50 mg Intravenous Q6H  . insulin aspart  1-3 Units Subcutaneous Q4H  . lidocaine      . mouth rinse  15 mL Mouth Rinse q12n4p  .  morphine  injection  4 mg Intravenous Once  . nystatin  5 mL Oral QID  . ondansetron (ZOFRAN) IV  4 mg Intravenous Once  . potassium chloride  40 mEq Oral Once  . sodium chloride flush  5 mL Intracatheter Q8H    Dialysis Orders:  Assessment:  67F with AoCKD3 and severe inc AG metabolic acidosis  1. AoCKD3 likely from acute and chronic NSAID use (either hemodynamic ATN and/or AIN type), nonoliguric, cr improved from 5.15 on admission to 2.35 today 2. Hypotension suspect hypovolemia + sepsis (E coli UTI) 3. Severe metabolic acidosis with inc AG, lactae WNL, urine ketones negative. Suspect AG multifactorial with starvation ketoacidosis (elevated beta hydroxybutyric acid) + AKI.  Improving slowly with aggressive inteventions (bicarb up from undetectable to 15) 4. B/l nephrolithiasis including large L sided stones s/p perc neph drain today 5. Chronic heavy NSAID use (1g q4h) 6. Migraines 7. BPAD 8. Severe hypokalemia   Plan:  1.  No indications for dialysis  2.  KCl being repleted, she will likely require a good bit more to fully replete.  3.  Ok with D/C IV bicarb, started na bicarb 1300 TID for now.   4.  Continue to hold RAAS inhibition in setting of AKI.  (losartan 25 daily home med) 5.  Will need migraine management plan prior to discharge that does not include NSAIDs.   Jannifer Hick MD 12/25/2017, 1:04 PM  [facility] Pager: [phone]

## 2017-12-25 NOTE — Progress Notes (Signed)
Pt w/ questionable EKG (lead placement vs electrolytes vs cardiac changes). 12 lead assessed at bs by MD; changes viewed to be consistent w/ electrolyte levels. Interventions in place. IR RN informed of all before tx for px. VS remain stable.

## 2017-12-25 NOTE — Progress Notes (Signed)
[facility] Progress Note Patient Name: Pamela Carney DOB: May 26, 1965 MRN: [id number]   Date of Service  12/25/2017  HPI/Events of Note  Hypkalemia  eICU Interventions  Potassium replaced     Intervention Category Intermediate Interventions: Electrolyte abnormality - evaluation and management  Reginaldo Hazard 12/25/2017, 6:00 AM

## 2017-12-25 NOTE — Progress Notes (Signed)
Subjective/Chief Complaint:  1 -  Acute Renal Failure  - Cr 5.1 up from baseline <1.5 by ER labs. Admits to supratheraputic NSAID use for migraines. Trending dwon with hydration / hold NSAIDS.   2 -  Left > Right Bilateral Renal Stones - RLP 69mm non-obstrucing and left 64mm upper infundibulum (some upper pole hydro), 3cm LLP partial staghorn by CT 12/2017 on eval abdominal pain and hematuria. Left neph tube placed 12/2017.   3 - Bacteruria - many bacteria on UA from ER 12/2017, some left upper pole focal obstruction from stone. WBC 20k. UCX 10/4 e. Coli / penidng. Placed on empiric rocephin.  4 - Hematuria -  New hematuria 2019. Large bilateral stones as per above.   PMH sig for biplor, migrain, morbid obesity. No PCP in years.   Today "Aaliyha" is seen is stable. Recived left neph tube earlier which appears in good position with curl traversing area of obstructing stone.     Objective: Vital signs in last 24 hours: Temp:  [97.8 F (36.6 C)-98.3 F (36.8 C)] 97.8 F (36.6 C) (10/05 1124) Pulse Rate:  [53-76] 55 (10/05 1300) Resp:  [11-20] 17 (10/05 1300) BP: (60-165)/(31-74) 165/74 (10/05 0954) SpO2:  [93 %-100 %] 96 % (10/05 1300) Arterial Line BP: (110-169)/(32-64) 110/32 (10/05 1300) Weight:  [104.7 kg] 104.7 kg (10/04 1448) Last BM Date: 12/24/17  Intake/Output from previous day: 10/04 0701 - 10/05 0700 In: 2962.9 [I.V.:2569.5; IV Piggyback:393.4] Out: 3580 [Urine:3580] Intake/Output this shift: Total I/O In: 742.1 [P.O.:60; I.V.:232.2; Other:250; IV Piggyback:199.9] Out: -   Physical Exam  Constitutional: She appears well-developed.  Some moon fascies  HENT:  Head: Normocephalic.  Eyes: Pupils are equal, round, and reactive to light.  Neck: Normal range of motion.  Cardiovascular: Normal rate.  Respiratory: Effort normal.  GI: Soft.  Morbid truncal obesity.   Genitourinary:  Genitourinary Comments: No CVAT at present. Left neph tube in place with  light pink urine. No hematomas.  Musculoskeletal: Normal range of motion.  Neurological: She is alert.  Skin: Skin is warm.  Psychiatric: She has a normal mood and affect.   Lab Results:  Recent Labs    12/24/17 0500 12/25/17 0322  WBC 17.2* 9.5  HGB 12.3 10.6*  HCT 36.3 30.5*  PLT 304 315   BMET Recent Labs    12/24/17 1324 12/25/17 0322  NA 140 141  K 2.7* 2.4*  CL 112* 111  CO2 9* 15*  GLUCOSE 61* 170*  BUN 70* 57*  CREATININE 3.30* 2.35*  CALCIUM 6.0* 6.3*   PT/INR Recent Labs    12/23/17 2123  LABPROT 16.9*  INR 1.38   ABG Recent Labs    12/24/17 0126 12/24/17 0901  PHART 7.066* 7.201*  HCO3 2.4* 7.4*    Studies/Results: Dg Chest 2 View  Result Date: 12/23/2017 CLINICAL DATA:  Dysuria, weakness EXAM: CHEST - 2 VIEW COMPARISON:  None. FINDINGS: Lungs are clear.  No pleural effusion or pneumothorax. The heart is normal in size. Visualized osseous structures are within normal limits. IMPRESSION: Normal chest radiographs. Electronically Signed   By: Julian Hy M.D.   On: 12/23/2017 22:06   Ct Renal Stone Study  Result Date: 12/23/2017 CLINICAL DATA:  Dysuria, weakness EXAM: CT ABDOMEN AND PELVIS WITHOUT CONTRAST TECHNIQUE: Multidetector CT imaging of the abdomen and pelvis was performed following the standard protocol without IV contrast. COMPARISON:  12/18/2013 FINDINGS: Lower chest: Lung bases are clear. Hepatobiliary: Mild hepatic steatosis. Layering tiny gallstones (series 3/image 29), without  associated inflammatory changes. No intrahepatic or extrahepatic ductal dilatation. Pancreas: Within normal limits. Spleen: Within normal limits. Adrenals/Urinary Tract: 2.9 cm right adrenal adenoma (series 3/image 19), previously 2.2 cm. Left adrenal gland is within normal limits. Right kidney is notable for a 5 mm nonobstructing right lower pole renal calculus (series 3/image 41). Vascular calcifications. No hydronephrosis. 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 Layering calcifications in a left lower pole calyceal diverticulum (series 3/image 39). No hydronephrosis. Bladder is notable for nondependent gas. Stomach/Bowel: Stomach is within normal limits. No evidence of bowel obstruction. Normal appendix (series 3/image 58). Sigmoid diverticulosis, without evidence of diverticulitis. Vascular/Lymphatic: No evidence of abdominal aortic aneurysm. Prominent para-aortic nodes measuring up to 10 mm short axis (series 3/image 34), at the upper limits of normal. Reproductive: Uterine IUD, mildly obliqued, with the right arm possibly embedded in the myometrium (series 3/image 69). Bilateral ovaries are unremarkable. Other: No abdominopelvic ascites. Tiny fat containing periumbilical hernia (series 3/image 62). Musculoskeletal: Mild degenerative changes of the visualized thoracolumbar spine. 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 No hydronephrosis. Nondependent gas in the bladder, correlate for cystitis. No evidence of bowel obstruction. Normal appendix. Sigmoid diverticulosis, without evidence of diverticulitis. Cholelithiasis, without associated inflammatory changes. Uterine IUD may be mildly obliqued, with its right arm possibly embedded in the myometrium. Electronically Signed   By: Julian Hy M.D.   On: 12/23/2017 23:57   Ir Nephrostomy Placement Left  Result Date: 12/25/2017 CLINICAL DATA:  Left nephrolithiasis EXAM: LEFT PERCUTANEOUS NEPHROSTOMY CATHETER PLACEMENT UNDER ULTRASOUND AND FLUOROSCOPIC GUIDANCE FLUOROSCOPY TIME:  2.6 minute; 327 uGym2 DAP TECHNIQUE: The procedure, risks (including but not limited to bleeding, infection, organ damage ), Benefits, and alternatives were explained to the patient. Questions regarding the procedure were  encouraged and answered. The patient understands and consents to the procedure. Survey ultrasound of the left flank was performed, the kidney localized, and an appropriate skin entry site determined. Leftflank region prepped with Betadine, draped in usual sterile fashion, infiltrated locally with 1% lidocaine. Intravenous Fentanyl and Versed were administered as conscious sedation during continuous monitoring of the patient's level of consciousness and physiological / cardiorespiratory status by the radiology RN, with a total moderate sedation time of 17 minutes. Under real-time ultrasound guidance, a 21-gauge trocar needle was advanced into a posterior lower pole calyx. Ultrasound image documentation was saved. Cloudy urine spontaneously returned through the needle. Needle was exchanged over a guidewire for transitional dilator. Contrast injection confirmed appropriate positioning. A small central renal collecting system was identified. Large lower pole calyceal diverticulum corresponding to CT findings, containing calculi. Proximal left ureter patent and nondilated. Catheter was exchanged over a guidewire for a 10 French Dawson Mueller pigtail catheter, formed centrally within the upper pole moiety of the renal collecting system. Contrast injection confirms appropriate positioning and patency. Catheter secured externally with 0 Prolene suture and StatLock, and placed to external drain bag. Urine sample sent for Gram stain and culture. COMPLICATIONS: COMPLICATIONS none IMPRESSION: 1. Technically successful left percutaneous nephrostomy catheter placement. 2. Left nephrolithiasis with attenuated central renal collecting system. Electronically Signed   By: Lucrezia Europe M.D.   On: 12/25/2017 10:30    Anti-infectives: Anti-infectives (From admission, onward)   Start     Dose/Rate Route Frequency Ordered Stop   12/25/17 0600  ciprofloxacin (CIPRO) IVPB  400 mg     400 mg 200 mL/hr over 60 Minutes Intravenous To  Radiology 12/24/17 1409 12/25/17 1010   12/25/17 0000  cefTRIAXone (ROCEPHIN) 1 g in sodium chloride 0.9 % 100 mL IVPB  Status:  Discontinued     1 g 200 mL/hr over 30 Minutes Intravenous Every 24 hours 12/24/17 0957 12/24/17 1017   12/25/17 0000  cefTRIAXone (ROCEPHIN) 2 g in sodium chloride 0.9 % 100 mL IVPB  Status:  Discontinued     2 g 200 mL/hr over 30 Minutes Intravenous Every 24 hours 12/24/17 1017 12/24/17 1550   12/24/17 1630  cefTAZidime (FORTAZ) 1 g in sodium chloride 0.9 % 100 mL IVPB     1 g 200 mL/hr over 30 Minutes Intravenous Every 24 hours 12/24/17 1613     12/23/17 2245  cefTRIAXone (ROCEPHIN) 1 g in sodium chloride 0.9 % 100 mL IVPB     1 g 200 mL/hr over 30 Minutes Intravenous  Once 12/23/17 2235 12/24/17 0032      Assessment/Plan:  1 -  Acute Renal Failure  - multifactorial but mostly likely NSAID toxicity + some mild pre-renal hypotension. Minimal overall nephron obstruction by imaging, but may be playing minor role. This is rapidly improving.   2 -  Left > Right Bilateral Renal Stones - Very large stones, will need staged bilateral combination left percutaneous / right retrograde surgeyr for goal of stone free in leective setting after clears infecitous parameters. Now s/p neph tube this admission.   3 - Bacteruria - agree with rocephin pending C/S data.   4 - Hematuria - likely from stones. She will get elective cysto as part of stone surgery to complete eval.  Please call me directly with questions anytime.   [facility], Coen Miyasato 12/25/2017

## 2017-12-25 NOTE — Progress Notes (Signed)
Follow up - Critical Care Medicine Note  Patient Details:    52 year old female admitted 10/4 for renal failure, UTI, and metabolic acidosis. Course complicated by profound hypotension.   Lines, Airways, Drains: Arterial Line 12/25/17 Right Femoral (Active)  Site Assessment Clean;Dry 12/25/2017  8:00 AM  Line Status Pulsatile blood flow 12/25/2017  8:00 AM  Art Line Waveform Appropriate;Square wave test performed 12/25/2017  8:00 AM  Art Line Interventions Zeroed and calibrated;Leveled;Connections checked and tightened;Flushed per protocol 12/25/2017  6:00 AM  Color/Movement/Sensation Capillary refill less than 3 sec 12/25/2017  8:00 AM  Dressing Type Transparent;Occlusive 12/25/2017  8:00 AM  Dressing Status Clean;Dry;Intact;Antimicrobial disc in place 12/25/2017  8:00 AM     Nephrostomy Left 10.2 Fr. (Active)  Dressing Status Clean;Dry;Intact 12/25/2017  9:00 AM  Dressing Type Dry dressing;Split gauze 12/25/2017  9:00 AM     Urethral Catheter A OLEARY RN Straight-tip;Non-latex 16 Fr. (Active)  Indication for Insertion or Continuance of Catheter Bladder outlet obstruction / other urologic reason 12/25/2017  4:00 AM  Site Assessment Clean;Intact 12/25/2017  4:00 AM  Catheter Maintenance Bag below level of bladder 12/25/2017  4:00 AM  Collection Container Standard drainage bag 12/25/2017  4:00 AM  Securement Method Securing device (Describe) 12/25/2017  4:00 AM  Urinary Catheter Interventions Unclamped 12/24/2017  2:48 PM  Input (mL) 250 mL 12/25/2017  9:10 AM  Output (mL) 125 mL 12/25/2017  6:00 AM    Anti-infectives:  Anti-infectives (From admission, onward)   Start     Dose/Rate Route Frequency Ordered Stop   12/25/17 0600  ciprofloxacin (CIPRO) IVPB 400 mg     400 mg 200 mL/hr over 60 Minutes Intravenous To Radiology 12/24/17 1409 12/25/17 1010   12/25/17 0000  cefTRIAXone (ROCEPHIN) 1 g in sodium chloride 0.9 % 100 mL IVPB  Status:  Discontinued     1 g 200 mL/hr over 30 Minutes Intravenous  Every 24 hours 12/24/17 0957 12/24/17 1017   12/25/17 0000  cefTRIAXone (ROCEPHIN) 2 g in sodium chloride 0.9 % 100 mL IVPB  Status:  Discontinued     2 g 200 mL/hr over 30 Minutes Intravenous Every 24 hours 12/24/17 1017 12/24/17 1550   12/24/17 1630  cefTAZidime (FORTAZ) 1 g in sodium chloride 0.9 % 100 mL IVPB     1 g 200 mL/hr over 30 Minutes Intravenous Every 24 hours 12/24/17 1613     12/23/17 2245  cefTRIAXone (ROCEPHIN) 1 g in sodium chloride 0.9 % 100 mL IVPB     1 g 200 mL/hr over 30 Minutes Intravenous  Once 12/23/17 2235 12/24/17 0032      Microbiology: Results for orders placed or performed during the hospital encounter of 12/23/17  Urine culture     Status: Abnormal (Preliminary result)   Collection Time: 12/23/17  9:28 PM  Result Value Ref Range Status   Specimen Description URINE, RANDOM  Final   Special Requests   Final    NONE Performed at [facility], [address], [place], [place] [address]    Culture >=100,000 COLONIES/mL GRAM NEGATIVE RODS (A)  Final   Report Status PENDING  Incomplete  Culture, blood (Routine x 2)     Status: None (Preliminary result)   Collection Time: 12/23/17  9:46 PM  Result Value Ref Range Status   Specimen Description BLOOD BLOOD RIGHT FOREARM  Final   Special Requests   Final    BOTTLES DRAWN AEROBIC AND ANAEROBIC Blood Culture results may not be optimal  due to an inadequate volume of blood received in culture bottles   Culture   Final    NO GROWTH < 24 HOURS Performed at [facility] [address] [place], [place] [address]    Report Status PENDING  Incomplete  Culture, blood (Routine x 2)     Status: None (Preliminary result)   Collection Time: 12/23/17 10:55 PM  Result Value Ref Range Status   Specimen Description BLOOD RIGHT HAND  Final   Special Requests   Final    BOTTLES DRAWN AEROBIC ONLY Blood Culture results may not be optimal due to an inadequate volume of blood received in culture bottles   Culture    Final    NO GROWTH < 24 HOURS Performed at [facility], [place] [address] [place], [place] [address]    Report Status PENDING  Incomplete  Blood culture (routine x 2)     Status: None (Preliminary result)   Collection Time: 12/24/17  5:00 AM  Result Value Ref Range Status   Specimen Description BLOOD LEFT ARM  Final   Special Requests   Final    BOTTLES DRAWN AEROBIC ONLY Blood Culture results may not be optimal due to an inadequate volume of blood received in culture bottles   Culture   Final    NO GROWTH < 12 HOURS Performed at [facility], [place] [address] [place], [place] [address]    Report Status PENDING  Incomplete  MRSA PCR Screening     Status: None   Collection Time: 12/24/17  3:10 PM  Result Value Ref Range Status   MRSA by PCR NEGATIVE NEGATIVE Final    Comment:        The GeneXpert MRSA Assay (FDA approved for NASAL specimens only), is one component of a comprehensive MRSA colonization surveillance program. It is not intended to diagnose MRSA infection nor to guide or monitor treatment for MRSA infections. 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 No Hydronephrosis. Nondependent gas in the bladder, correlate for cystitis. Cholelithiasis, without associated inflammatory changes. Uterine IUD may be mildly obliqued, with its right arm possibly embedded in the myometrium.  Consults: CCM IR Urology  Subjective:    Overnight Issues:  No acute events overnight. Pt mentation well. Says she feels better overall, but still feels "funny." Denies CP, SOB. Endorses  some abdominal back pain.   Objective:  Vital signs for last 24 hours: Temp:  [97.9 F (36.6 C)-98.3 F (36.8 C)] 98.3 F (36.8 C) (10/05 0722) Pulse Rate:  [53-76] 65 (10/05 0954) Resp:  [11-20] 16 (10/05 0954) BP: (54-165)/(17-74) 165/74 (10/05 0954) SpO2:  [95 %-100 %] 99 % (10/05 0954) Arterial Line BP: (134-154)/(42-55) 136/45 (10/05 0800) Weight:  [104.7 kg] 104.7 kg (10/04 1448)  Hemodynamic parameters for last 24 hours:    Intake/Output from previous day: 10/04 0701 - 10/05 0700 In: 2962.9 [I.V.:2569.5; IV Piggyback:393.4] Out: 3580 [Urine:3580]  Intake/Output this shift: Total I/O In: 742.1 [P.O.:60; I.V.:232.2; Other:250; IV Piggyback:199.9] Out: -   Vent settings for last 24 hours:    Physical Exam:  General: alert and no  respiratory distress Neuro: alert, oriented and nonfocal exam HEENT/Neck: no JVD Resp: clear to auscultation bilaterally and NWOB CVS: regular rate and rhythm, S1, S2 normal, no murmur, click, rub or gallop GI: soft, nontender, BS WNL, no r/g Skin: no rash Extremities: no edema, no erythema, pulses WNL  Assessment/Plan:   Sepsis: secondary to urinary tract infection.  - Day 2 Ceftazidime, Cipro IV  Hypotension (resolved).  Hypertension.   Difficulty reading pressures with cuffAline shows hypertension. Reliable wavefrom - hypotenson secondary to sepsis - start amlodipine 5 mg  Acute renal failure: Multifactorial to NSAID overdose, poor PO intake, bilateral nephrolithiasis, sepsis. S/p L nephrostomy tube - Cr improving s/p aggressive hydration - Urology recommendations - Follow BMP  Anion gap acidosis (improved): ddx includes ketoacidosis from poor PO intake, AKI - Follow BMP - d/c HCO3-  Hypokalemia - replete - recheck BMP @ 1500   LOS: 1 day   Bonnita Hollow 12/25/2017

## 2017-12-25 NOTE — Progress Notes (Signed)
Contacted [facility], made [place], RN aware that pt will require Femoral a-line as peripheral attempts by multiple RRT's unsuccessful.  Awaiting new order, will continue to monitor.

## 2017-12-26 DIAGNOSIS — I10 Essential (primary) hypertension: Secondary | ICD-10-CM

## 2017-12-26 LAB — GLUCOSE, CAPILLARY
GLUCOSE-CAPILLARY: 168 mg/dL — AB (ref 70–99)
Glucose-Capillary: 131 mg/dL — ABNORMAL HIGH (ref 70–99)
Glucose-Capillary: 117 mg/dL — ABNORMAL HIGH (ref 70–99)
Glucose-Capillary: 148 mg/dL — ABNORMAL HIGH (ref 70–99)

## 2017-12-26 LAB — BASIC METABOLIC PANEL
ANION GAP: 7 (ref 5–15)
Anion gap: 8 (ref 5–15)
BUN: 40 mg/dL — AB (ref 6–20)
BUN: 32 mg/dL — ABNORMAL HIGH (ref 6–20)
CALCIUM: 7.4 mg/dL — AB (ref 8.9–10.3)
CHLORIDE: 111 mmol/L (ref 98–111)
CO2: 19 mmol/L — ABNORMAL LOW (ref 22–32)
CO2: 22 mmol/L (ref 22–32)
CREATININE: 1.14 mg/dL — AB (ref 0.44–1.00)
Calcium: 8.1 mg/dL — ABNORMAL LOW (ref 8.9–10.3)
Chloride: 114 mmol/L — ABNORMAL HIGH (ref 98–111)
Creatinine, Ser: 1.35 mg/dL — ABNORMAL HIGH (ref 0.44–1.00)
GFR calc Af Amer: 52 mL/min — ABNORMAL LOW (ref >60)
GFR calc Af Amer: >60 mL/min (ref >60)
GFR calc non Af Amer: 55 mL/min — ABNORMAL LOW (ref >60)
GFR, EST NON AFRICAN AMERICAN: 45 mL/min — AB (ref >60)
GLUCOSE: 147 mg/dL — AB (ref 70–99)
Glucose, Bld: 111 mg/dL — ABNORMAL HIGH (ref 70–99)
Potassium: 2.8 mmol/L — ABNORMAL LOW (ref 3.5–5.1)
Potassium: 2.2 mmol/L — CL (ref 3.5–5.1)
Sodium: 141 mmol/L (ref 135–145)
Sodium: 140 mmol/L (ref 135–145)

## 2017-12-26 LAB — URINE CULTURE

## 2017-12-26 LAB — CBC
HEMATOCRIT: 28.7 % — AB (ref 36.0–46.0)
Hemoglobin: 10.1 g/dL — ABNORMAL LOW (ref 12.0–15.0)
MCH: 31.6 pg (ref 26.0–34.0)
MCHC: 35.2 g/dL (ref 30.0–36.0)
MCV: 89.7 fL (ref 78.0–100.0)
Platelets: 300 10*3/uL (ref 150–400)
RBC: 3.2 MIL/uL — ABNORMAL LOW (ref 3.87–5.11)
RDW: 15.5 % (ref 11.5–15.5)
WBC: 11.7 10*3/uL — ABNORMAL HIGH (ref 4.0–10.5)

## 2017-12-26 LAB — PROCALCITONIN: Procalcitonin: <0.1 ng/mL

## 2017-12-26 LAB — MAGNESIUM: MAGNESIUM: 2.4 mg/dL (ref 1.7–2.4)

## 2017-12-26 LAB — PHOSPHORUS: PHOSPHORUS: 2.1 mg/dL — AB (ref 2.5–4.6)

## 2017-12-26 MED ORDER — POTASSIUM CHLORIDE CRYS ER 10 MEQ PO TBCR
EXTENDED_RELEASE_TABLET | ORAL | Status: AC
  Filled 2017-12-26: qty 4

## 2017-12-26 MED ORDER — FUROSEMIDE 10 MG/ML IJ SOLN
40.0000 mg | Freq: Once | INTRAMUSCULAR | Status: AC
  Administered 2017-12-26: 40 mg via INTRAVENOUS

## 2017-12-26 MED ORDER — LABETALOL HCL 5 MG/ML IV SOLN
INTRAVENOUS | Status: AC
  Filled 2017-12-26: qty 4

## 2017-12-26 MED ORDER — FUROSEMIDE 10 MG/ML IJ SOLN
INTRAMUSCULAR | Status: AC
  Filled 2017-12-26: qty 4

## 2017-12-26 MED ORDER — SODIUM BICARBONATE 650 MG PO TABS
1300.0000 mg | ORAL_TABLET | Freq: Three times a day (TID) | ORAL | Status: DC
Start: 2017-12-26 — End: 2017-12-29
  Administered 2017-12-26 – 2017-12-29 (×10): 1300 mg via ORAL
  Filled 2017-12-26 (×10): qty 2

## 2017-12-26 MED ORDER — POTASSIUM CHLORIDE CRYS ER 10 MEQ PO TBCR
40.0000 meq | EXTENDED_RELEASE_TABLET | ORAL | Status: AC
  Administered 2017-12-26 (×2): 40 meq via ORAL
  Filled 2017-12-26: qty 4

## 2017-12-26 MED ORDER — SODIUM CHLORIDE 0.9 % IV SOLN
1.0000 g | INTRAVENOUS | Status: DC
  Administered 2017-12-26 – 2017-12-29 (×3): 1 g via INTRAVENOUS
  Filled 2017-12-26 (×5): qty 10

## 2017-12-26 MED ORDER — POTASSIUM CHLORIDE CRYS ER 20 MEQ PO TBCR
40.0000 meq | EXTENDED_RELEASE_TABLET | ORAL | Status: AC
  Administered 2017-12-26 (×2): 40 meq via ORAL
  Filled 2017-12-26 (×2): qty 2

## 2017-12-26 MED ORDER — POTASSIUM CHLORIDE 10 MEQ/100ML IV SOLN
10.0000 meq | INTRAVENOUS | Status: AC
  Administered 2017-12-26 (×4): 10 meq via INTRAVENOUS
  Filled 2017-12-26 (×4): qty 100

## 2017-12-26 MED ORDER — AMLODIPINE BESYLATE 5 MG PO TABS
5.0000 mg | ORAL_TABLET | Freq: Once | ORAL | Status: AC
  Administered 2017-12-26: 5 mg via ORAL
  Filled 2017-12-26: qty 1

## 2017-12-26 MED ORDER — POTASSIUM CHLORIDE CRYS ER 10 MEQ PO TBCR
EXTENDED_RELEASE_TABLET | ORAL | Status: AC
  Filled 2017-12-26: qty 1

## 2017-12-26 MED ORDER — POTASSIUM CHLORIDE 10 MEQ/100ML IV SOLN
10.0000 meq | INTRAVENOUS | Status: AC
  Administered 2017-12-26 (×2): 10 meq via INTRAVENOUS
  Filled 2017-12-26: qty 100

## 2017-12-26 MED ORDER — AMLODIPINE BESYLATE 10 MG PO TABS
10.0000 mg | ORAL_TABLET | Freq: Every day | ORAL | Status: DC
  Administered 2017-12-26: 5 mg via ORAL
  Filled 2017-12-26 (×2): qty 1

## 2017-12-26 MED ORDER — LABETALOL HCL 5 MG/ML IV SOLN
10.0000 mg | INTRAVENOUS | Status: DC | PRN
  Administered 2017-12-26 – 2017-12-27 (×4): 10 mg via INTRAVENOUS
  Filled 2017-12-26 (×2): qty 4

## 2017-12-26 MED ORDER — HYDRALAZINE HCL 20 MG/ML IJ SOLN
10.0000 mg | INTRAMUSCULAR | Status: DC | PRN
  Administered 2017-12-26 (×2): 10 mg via INTRAVENOUS
  Filled 2017-12-26 (×3): qty 1

## 2017-12-26 NOTE — Progress Notes (Signed)
[facility] Progress Note   Subjective:   Remains in ICU.  Now hypertensive per femoral A line.    Objective Vitals:   12/26/17 0730 12/26/17 0745 12/26/17 0800 12/26/17 0815  BP:      Pulse: 67 65 66 (!) 59  Resp: 13 12 13 15   Temp:      TempSrc:      SpO2: 93% 94% 93% 92%  Weight:      Height:       Physical Exam General: chronically ill appearing, comfortable CV: RRR, II/VI syst murmur Lungs: clear  Abdomen: soft, nontender Extremities: no edema Skin: dry, no rashes, warm Neuro: oriented, conversant GU: foley draining bloody urine.  L urostomy dressing c/d/i  Additional Objective Labs: Basic Metabolic Panel: Recent Labs  Lab 12/25/17 0322 12/25/17 1639 12/26/17 0538  NA 141 140 141  K 2.4* 2.3* 2.8*  CL 111 111 114*  CO2 15* 19* 19*  GLUCOSE 170* 263* 111*  BUN 57* 47* 40*  CREATININE 2.35* 1.86* 1.35*  CALCIUM 6.3* 6.4* 7.4*  PHOS  --   --  2.1*   Liver Function Tests: Recent Labs  Lab 12/23/17 2123  AST 19  ALT 17  ALKPHOS 89  BILITOT 1.5*  PROT 6.6  ALBUMIN 3.1*   No results for input(s): LIPASE, AMYLASE in the last 168 hours. CBC: Recent Labs  Lab 12/23/17 2123 12/24/17 0500 12/25/17 0322 12/26/17 0538  WBC 20.2* 17.2* 9.5 11.7*  NEUTROABS 17.3*  --   --   --   HGB 15.2* 12.3 10.6* 10.1*  HCT 45.9 36.3 30.5* 28.7*  MCV 94.1 91.2 88.9 89.7  PLT 381 304 315 300   Blood Culture    Component Value Date/Time   SDES BLOOD LEFT ARM 12/24/2017 0500   SPECREQUEST  12/24/2017 0500    BOTTLES DRAWN AEROBIC ONLY Blood Culture results may not be optimal due to an inadequate volume of blood received in culture bottles   CULT  12/24/2017 0500    NO GROWTH 1 DAY Performed at [facility] [address], [place], [place] [address]    REPTSTATUS PENDING 12/24/2017 0500    Cardiac Enzymes: No results for input(s): CKTOTAL, CKMB, CKMBINDEX, TROPONINI in the last 168 hours. CBG: Recent Labs  Lab 12/25/17 1123  12/25/17 1516 12/25/17 1800 12/25/17 2204 12/26/17 0820  GLUCAP 121* 137* 281* 143* 131*   Iron Studies: No results for input(s): IRON, TIBC, TRANSFERRIN, FERRITIN in the last 72 hours. @lablastinr3 @ Studies/Results: Ir Nephrostomy Placement Left  Result Date: 12/25/2017 CLINICAL DATA:  Left nephrolithiasis EXAM: LEFT PERCUTANEOUS NEPHROSTOMY CATHETER PLACEMENT UNDER ULTRASOUND AND FLUOROSCOPIC GUIDANCE FLUOROSCOPY TIME:  2.6 minute; 327 uGym2 DAP TECHNIQUE: The procedure, risks (including but not limited to bleeding, infection, organ damage ), Benefits, and alternatives were explained to the patient. Questions regarding the procedure were encouraged and answered. The patient understands and consents to the procedure. Survey ultrasound of the left flank was performed, the kidney localized, and an appropriate skin entry site determined. Leftflank region prepped with Betadine, draped in usual sterile fashion, infiltrated locally with 1% lidocaine. Intravenous Fentanyl and Versed were administered as conscious sedation during continuous monitoring of the patient's level of consciousness and physiological / cardiorespiratory status by the radiology RN, with a total moderate sedation time of 17 minutes. Under real-time ultrasound guidance, a 21-gauge trocar needle was advanced into a posterior lower pole calyx. Ultrasound image documentation was saved. Cloudy urine spontaneously returned through the needle. Needle was exchanged  over a guidewire for transitional dilator. Contrast injection confirmed appropriate positioning. A small central renal collecting system was identified. Large lower pole calyceal diverticulum corresponding to CT findings, containing calculi. Proximal left ureter patent and nondilated. Catheter was exchanged over a guidewire for a 10 French Dawson Mueller pigtail catheter, formed centrally within the upper pole moiety of the renal collecting system. Contrast injection confirms  appropriate positioning and patency. Catheter secured externally with 0 Prolene suture and StatLock, and placed to external drain bag. Urine sample sent for Gram stain and culture. COMPLICATIONS: COMPLICATIONS none IMPRESSION: 1. Technically successful left percutaneous nephrostomy catheter placement. 2. Left nephrolithiasis with attenuated central renal collecting system. Electronically Signed   By: Lucrezia Europe M.D.   On: 12/25/2017 10:30   Medications: . sodium chloride 30 mL/hr (12/26/17 0820)  . cefTAZidime (FORTAZ)  IV 1 g (12/26/17 0540)   . [START ON 12/27/2017] amLODipine  10 mg Oral Daily  . calcium carbonate  800 mg of elemental calcium Oral TID  . chlorhexidine  15 mL Mouth Rinse BID  . heparin injection (subcutaneous)  5,000 Units Subcutaneous Q8H  . insulin aspart  0-20 Units Subcutaneous TID WC  . insulin aspart  0-5 Units Subcutaneous QHS  . labetalol      . mouth rinse  15 mL Mouth Rinse q12n4p  . nystatin  5 mL Oral QID  . potassium chloride  40 mEq Oral Once  . potassium chloride  40 mEq Oral Q4H  . sodium bicarbonate  650 mg Oral TID  . sodium chloride flush  5 mL Intracatheter Q8H    Dialysis Orders:  Assessment:  71F with AoCKD3 and severe inc AG metabolic acidosis  1. AoCKD3 likely from acute and chronic NSAID use (either hemodynamic ATN and/or AIN type), nonoliguric, cr improved from 5.15 on admission to 1.4 today 2. Hypotension suspect hypovolemia + sepsis (E coli UTI) now corrected with hypertension 3. Severe metabolic acidosis with inc AG, lactae WNL, urine ketones negative. Suspect AG multifactorial with starvation ketoacidosis (elevated beta hydroxybutyric acid) + AKI.  Improving slowly with aggressive inteventions.  Query RTA but cannot make diagnosis in acute setting 4. B/l nephrolithiasis including large L sided stones s/p perc neph drain 10/5 5. Chronic heavy NSAID use (1g q4h) 6. Migraines 7. BPAD 8. Severe hypokalemia   Plan:  1.  Maintaining  volume status with oral intake, hold IVF 2.  KCl being repleted, she will likely require a good bit more to fully replete.  3.  Continue oral na bicarb 1300 TID for now.   4.  Continue to hold RAAS inhibition in setting of AKI.  (losartan 25 daily home med).  Amlodipine 10mg  was initiated today.  5.  Will need migraine management plan prior to discharge that does not include NSAIDs.  6.  Urology following for definitive stone management - planned in elective setting later once acute issues have resolved. 6.  Nephrology outpatient follow up may be needed, will follow clinically inpatient for now.  Jannifer Hick MD 12/26/2017, 8:28 AM  [facility] Pager: [phone]

## 2017-12-26 NOTE — Progress Notes (Signed)
Follow up - Critical Care Medicine Note  Patient Details:    52 year old female admitted 10/4 for renal failure, UTI, and metabolic acidosis. Course complicated by profound hypotension.   Lines, Airways, Drains: Arterial Line 12/25/17 Right Femoral (Active)  Site Assessment Clean;Dry 12/25/2017  8:00 AM  Line Status Pulsatile blood flow 12/25/2017  8:00 AM  Art Line Waveform Appropriate;Square wave test performed 12/25/2017  8:00 AM  Art Line Interventions Zeroed and calibrated;Leveled;Connections checked and tightened;Flushed per protocol 12/25/2017  6:00 AM  Color/Movement/Sensation Capillary refill less than 3 sec 12/25/2017  8:00 AM  Dressing Type Transparent;Occlusive 12/25/2017  8:00 AM  Dressing Status Clean;Dry;Intact;Antimicrobial disc in place 12/25/2017  8:00 AM     Nephrostomy Left 10.2 Fr. (Active)  Dressing Status Clean;Dry;Intact 12/25/2017  9:00 AM  Dressing Type Dry dressing;Split gauze 12/25/2017  9:00 AM     Urethral Catheter A OLEARY RN Straight-tip;Non-latex 16 Fr. (Active)  Indication for Insertion or Continuance of Catheter Bladder outlet obstruction / other urologic reason 12/25/2017  4:00 AM  Site Assessment Clean;Intact 12/25/2017  4:00 AM  Catheter Maintenance Bag below level of bladder 12/25/2017  4:00 AM  Collection Container Standard drainage bag 12/25/2017  4:00 AM  Securement Method Securing device (Describe) 12/25/2017  4:00 AM  Urinary Catheter Interventions Unclamped 12/24/2017  2:48 PM  Input (mL) 250 mL 12/25/2017  9:10 AM  Output (mL) 125 mL 12/25/2017  6:00 AM    Anti-infectives:  Anti-infectives (From admission, onward)   Start     Dose/Rate Route Frequency Ordered Stop   12/26/17 1700  cefTRIAXone (ROCEPHIN) 1 g in sodium chloride 0.9 % 100 mL IVPB     1 g 200 mL/hr over 30 Minutes Intravenous Every 24 hours 12/26/17 1023 12/31/17 1659   12/26/17 0400  cefTAZidime (FORTAZ) 1 g in sodium chloride 0.9 % 100 mL IVPB  Status:  Discontinued     1 g 200 mL/hr  over 30 Minutes Intravenous Every 12 hours 12/25/17 2011 12/26/17 1023   12/25/17 0600  ciprofloxacin (CIPRO) IVPB 400 mg     400 mg 200 mL/hr over 60 Minutes Intravenous To Radiology 12/24/17 1409 12/25/17 1010   12/25/17 0000  cefTRIAXone (ROCEPHIN) 1 g in sodium chloride 0.9 % 100 mL IVPB  Status:  Discontinued     1 g 200 mL/hr over 30 Minutes Intravenous Every 24 hours 12/24/17 0957 12/24/17 1017   12/25/17 0000  cefTRIAXone (ROCEPHIN) 2 g in sodium chloride 0.9 % 100 mL IVPB  Status:  Discontinued     2 g 200 mL/hr over 30 Minutes Intravenous Every 24 hours 12/24/17 1017 12/24/17 1550   12/24/17 1630  cefTAZidime (FORTAZ) 1 g in sodium chloride 0.9 % 100 mL IVPB  Status:  Discontinued     1 g 200 mL/hr over 30 Minutes Intravenous Every 24 hours 12/24/17 1613 12/25/17 2011   12/23/17 2245  cefTRIAXone (ROCEPHIN) 1 g in sodium chloride 0.9 % 100 mL IVPB     1 g 200 mL/hr over 30 Minutes Intravenous  Once 12/23/17 2235 12/24/17 0032      Microbiology: Results for orders placed or performed during the hospital encounter of 12/23/17  Urine culture     Status: Abnormal   Collection Time: 12/23/17  9:28 PM  Result Value Ref Range Status   Specimen Description URINE, RANDOM  Final   Special Requests   Final    NONE Performed at [facility], [place] [address] [place], [place] [address]  Culture >=100,000 COLONIES/mL ESCHERICHIA COLI (A)  Final   Report Status 12/26/2017 FINAL  Final   Organism ID, Bacteria ESCHERICHIA COLI (A)  Final      Susceptibility   Escherichia coli - MIC*    AMPICILLIN >=32 RESISTANT Resistant     CEFAZOLIN <=4 SENSITIVE Sensitive     CEFTRIAXONE <=1 SENSITIVE Sensitive     CIPROFLOXACIN >=4 RESISTANT Resistant     GENTAMICIN <=1 SENSITIVE Sensitive     IMIPENEM <=0.25 SENSITIVE Sensitive     NITROFURANTOIN <=16 SENSITIVE Sensitive     TRIMETH/SULFA <=20 SENSITIVE Sensitive     AMPICILLIN/SULBACTAM 16 INTERMEDIATE Intermediate     PIP/TAZO <=4  SENSITIVE Sensitive     Extended ESBL NEGATIVE Sensitive     * >=100,000 COLONIES/mL ESCHERICHIA COLI  Culture, blood (Routine x 2)     Status: None (Preliminary result)   Collection Time: 12/23/17  9:46 PM  Result Value Ref Range Status   Specimen Description BLOOD BLOOD RIGHT FOREARM  Final   Special Requests   Final    BOTTLES DRAWN AEROBIC AND ANAEROBIC Blood Culture results may not be optimal due to an inadequate volume of blood received in culture bottles   Culture   Final    NO GROWTH 2 DAYS Performed at [facility] [address] [place], [place] [address]    Report Status PENDING  Incomplete  Culture, blood (Routine x 2)     Status: None (Preliminary result)   Collection Time: 12/23/17 10:55 PM  Result Value Ref Range Status   Specimen Description BLOOD RIGHT HAND  Final   Special Requests   Final    BOTTLES DRAWN AEROBIC ONLY Blood Culture results may not be optimal due to an inadequate volume of blood received in culture bottles   Culture   Final    NO GROWTH 1 DAY Performed at [facility], [place] [address] [place], [place] [address]    Report Status PENDING  Incomplete  Blood culture (routine x 2)     Status: None (Preliminary result)   Collection Time: 12/24/17  5:00 AM  Result Value Ref Range Status   Specimen Description BLOOD LEFT ARM  Final   Special Requests   Final    BOTTLES DRAWN AEROBIC ONLY Blood Culture results may not be optimal due to an inadequate volume of blood received in culture bottles   Culture   Final    NO GROWTH 1 DAY Performed at [facility], [place] [address], [place], [place] [address]    Report Status PENDING  Incomplete  MRSA PCR Screening     Status: None   Collection Time: 12/24/17  3:10 PM  Result Value Ref Range Status   MRSA by PCR NEGATIVE NEGATIVE Final    Comment:        The GeneXpert MRSA Assay (FDA approved for NASAL specimens only), is one component of a comprehensive MRSA colonization surveillance  program. It is not intended to diagnose MRSA infection nor to guide or monitor treatment for MRSA infections. Performed at [facility], [place] [address], [place], [place] [address]   Urine Culture     Status: Abnormal (Preliminary result)   Collection Time: 12/25/17  9:44 AM  Result Value Ref Range Status   Specimen Description KIDNEY LEFT NEPHROSTOMY  Final   Special Requests   Final    NONE Performed at [facility], [place] [address], [place], [place] [address]  Culture >=100,000 COLONIES/mL GRAM NEGATIVE RODS (A)  Final   Report Status PENDING  Incomplete    Best Practice/Protocols:  VTE Prophylaxis: Heparin (SQ) Sepsis (date>>>10/05)  Events: 10/04 > admitted for sepsis 10/05 IR nephrostomy   Procedure: 10/04 Aline placed 10/05 Left Nephrostomy tube   Studies: CT renal 10/4 > Multiple bilateral nonobstructing renal calculi, including a 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 No Hydronephrosis. Nondependent gas in the bladder, correlate for cystitis. Cholelithiasis, without associated inflammatory changes. Uterine IUD may be mildly obliqued, with its right arm possibly embedded in the myometrium.  Consults: CCM IR Urology  Subjective:    Overnight Issues:  No acute events overnight. Patient states that she is hungry. No other complaints.   Objective:  Vital signs for last 24 hours: Temp:  [97.2 F (36.2 C)-98.3 F (36.8 C)] 98.2 F (36.8 C) (10/06 1121) Pulse Rate:  [51-74] 64 (10/06 1120) Resp:  [11-28] 12 (10/06 1120) BP: (101-206)/(67-85) 195/75 (10/06 1100) SpO2:  [91 %-98 %] 94 % (10/06 1120) Arterial Line BP: (110-225)/(32-76) 186/58 (10/06 1120)  Hemodynamic parameters for last 24 hours:    Intake/Output from previous day: 10/05 0701 - 10/06 0700 In: 1636.8 [P.O.:480; I.V.:356.9; IV Piggyback:799.9] Out: 2015 [Urine:2015]  Intake/Output this shift: Total I/O In: 455 [P.O.:300; I.V.:125; Other:30] Out:  4431 [Urine:1650]  Vent settings for last 24 hours:    Physical Exam:  General: alert and no respiratory distress Neuro: alert, oriented and nonfocal exam HEENT/Neck: no JVD Resp: clear to auscultation bilaterally and NWOB CVS: regular rate and rhythm, S1, S2 normal, no murmur, click, rub or gallop GI: soft, nontender, BS WNL, no r/g Skin: no rash Extremities: no edema, no erythema, pulses WNL  Assessment/Plan:   Sepsis: secondary to urinary tract infection.  - narrow abx to Ceftriaxone, Cipro  Hypertension.   Aline shows hypertension. Reliable wavefrom.  - increase amlodipone 10 mg - IV Lasix 40 mg x1 - prn labetalol, hydral  Acute renal failure: Multifactorial to NSAID overdose, poor PO intake, bilateral nephrolithiasis, sepsis. S/p L nephrostomy tube - Cr improving s/p aggressive hydration - Urology recommendations - Follow BMP  Anion gap acidosis (improved): ddx includes ketoacidosis from poor PO intake, AKI - Follow BMP - d/c HCO3-  Hypokalemia Persistent. Mg wnl.  - replete Kdur 40 mgx2 - recheck BMP @ 1600  Dispo: Stable for transfer to floor.    LOS: 2 days   Bonnita Hollow 12/26/2017

## 2017-12-26 NOTE — Progress Notes (Addendum)
Called [facility], informed nurse Gretchen pt sbp remains 170s by femoral a-line, pt does not appear anxious. Gretchen to inform Dr Deterding.

## 2017-12-26 NOTE — Progress Notes (Addendum)
Elzie Rings, [facility] nurse informed pt reports "decreased sensation hurting and numbness both feet". BDPs absent by doppler confirmed with staff nurse Anderson [place], 1+ bilateral popliteal pulses palpable, lt popliteal pulse  > rt popliteal pulse, equal color and warmth BLE. Femoral a-line unremarkable. Will continue to monitor vascular pulses.

## 2017-12-26 NOTE — Progress Notes (Signed)
Chief Complaint: Patient was seen today for follow up (L)PCN   Supervising Physician: Arne Cleveland  Patient Status: [facility] - In-pt  Subjective: S/p (L)PCN yesterday for obstructive uropathy and (L)hydro She's feeling a little better this am. C/o mild soreness at PCN site  Objective: Physical Exam: BP 133/79   Pulse (!) 59   Temp 97.7 F (36.5 C) (Oral)   Resp 15   Ht 5' 2.99" (1.6 m)   Wt 104.7 kg   SpO2 92%   BMI 40.90 kg/m  (L)PCN intact, site clean, dry, minimally tender. Good clear UOP   Current Facility-Administered Medications:  .  0.9 %  sodium chloride infusion, , Intravenous, Continuous, Bonnita Hollow, MD, Last Rate: 30 mL/hr at 12/26/17 0820, 30 mL/hr at 12/26/17 0820 .  acetaminophen (TYLENOL) tablet 650 mg, 650 mg, Oral, Q6H PRN **OR** acetaminophen (TYLENOL) suppository 650 mg, 650 mg, Rectal, Q6H PRN, Samuella Cota, MD .  Derrill Memo ON 12/27/2017] amLODipine (NORVASC) tablet 10 mg, 10 mg, Oral, Daily, Sood, Vineet, MD .  amLODipine (NORVASC) tablet 5 mg, 5 mg, Oral, Once, Chesley Mires, MD .  calcium carbonate (TUMS - dosed in mg elemental calcium) chewable tablet 800 mg of elemental calcium, 800 mg of elemental calcium, Oral, TID, Samuella Cota, MD, 800 mg of elemental calcium at 12/25/17 2259 .  cefTRIAXone (ROCEPHIN) 1 g in sodium chloride 0.9 % 100 mL IVPB, 1 g, Intravenous, Q24H, Josephine Igo B, MD .  chlorhexidine (PERIDEX) 0.12 % solution 15 mL, 15 mL, Mouth Rinse, BID, Rush Farmer, MD, 15 mL at 12/25/17 1045 .  heparin injection 5,000 Units, 5,000 Units, Subcutaneous, Q8H, Rush Farmer, MD, 5,000 Units at 12/26/17 0555 .  hydrALAZINE (APRESOLINE) injection 10 mg, 10 mg, Intravenous, Q4H PRN, Deterding, Guadelupe Sabin, MD, 10 mg at 12/26/17 0610 .  HYDROcodone-acetaminophen (NORCO/VICODIN) 5-325 MG per tablet 1-2 tablet, 1-2 tablet, Oral, Q4H PRN, Arne Cleveland, MD, 2 tablet at 12/26/17 0704 .  insulin aspart (novoLOG) injection 0-20  Units, 0-20 Units, Subcutaneous, TID WC, Chesley Mires, MD, 3 Units at 12/26/17 0854 .  insulin aspart (novoLOG) injection 0-5 Units, 0-5 Units, Subcutaneous, QHS, Sood, Vineet, MD .  labetalol (NORMODYNE,TRANDATE) injection 10 mg, 10 mg, Intravenous, Q4H PRN, Chesley Mires, MD, 10 mg at 12/26/17 0807 .  MEDLINE mouth rinse, 15 mL, Mouth Rinse, q12n4p, Rush Farmer, MD, 15 mL at 12/25/17 1642 .  nystatin (MYCOSTATIN) 100000 UNIT/ML suspension 500,000 Units, 5 mL, Oral, QID, Samuella Cota, MD, 500,000 Units at 12/25/17 2301 .  ondansetron (ZOFRAN) tablet 4 mg, 4 mg, Oral, Q6H PRN **OR** ondansetron (ZOFRAN) injection 4 mg, 4 mg, Intravenous, Q6H PRN, Samuella Cota, MD, 4 mg at 12/24/17 0911 .  potassium chloride (K-DUR,KLOR-CON) CR tablet 40 mEq, 40 mEq, Oral, Q4H, Sood, Vineet, MD, 40 mEq at 12/26/17 0808 .  sodium bicarbonate tablet 1,300 mg, 1,300 mg, Oral, TID, Justin Mend, MD .  sodium chloride flush (NS) 0.9 % injection 5 mL, 5 mL, Intracatheter, Q8H, Arne Cleveland, MD  Labs: CBC Recent Labs    12/25/17 0322 12/26/17 0538  WBC 9.5 11.7*  HGB 10.6* 10.1*  HCT 30.5* 28.7*  PLT 315 300   BMET Recent Labs    12/25/17 1639 12/26/17 0538  NA 140 141  K 2.3* 2.8*  CL 111 114*  CO2 19* 19*  GLUCOSE 263* 111*  BUN 47* 40*  CREATININE 1.86* 1.35*  CALCIUM 6.4* 7.4*   LFT Recent Labs  12/23/17 2123  PROT 6.6  ALBUMIN 3.1*  AST 19  ALT 17  ALKPHOS 89  BILITOT 1.5*   PT/INR Recent Labs    12/23/17 2123  LABPROT 16.9*  INR 1.38     Studies/Results: Ir Nephrostomy Placement Left  Result Date: 12/25/2017 CLINICAL DATA:  Left nephrolithiasis EXAM: LEFT PERCUTANEOUS NEPHROSTOMY CATHETER PLACEMENT UNDER ULTRASOUND AND FLUOROSCOPIC GUIDANCE FLUOROSCOPY TIME:  2.6 minute; 327 uGym2 DAP TECHNIQUE: The procedure, risks (including but not limited to bleeding, infection, organ damage ), Benefits, and alternatives were explained to the patient. Questions  regarding the procedure were encouraged and answered. The patient understands and consents to the procedure. Survey ultrasound of the left flank was performed, the kidney localized, and an appropriate skin entry site determined. Leftflank region prepped with Betadine, draped in usual sterile fashion, infiltrated locally with 1% lidocaine. Intravenous Fentanyl and Versed were administered as conscious sedation during continuous monitoring of the patient's level of consciousness and physiological / cardiorespiratory status by the radiology RN, with a total moderate sedation time of 17 minutes. Under real-time ultrasound guidance, a 21-gauge trocar needle was advanced into a posterior lower pole calyx. Ultrasound image documentation was saved. Cloudy urine spontaneously returned through the needle. Needle was exchanged over a guidewire for transitional dilator. Contrast injection confirmed appropriate positioning. A small central renal collecting system was identified. Large lower pole calyceal diverticulum corresponding to CT findings, containing calculi. Proximal left ureter patent and nondilated. Catheter was exchanged over a guidewire for a 10 French Dawson Mueller pigtail catheter, formed centrally within the upper pole moiety of the renal collecting system. Contrast injection confirms appropriate positioning and patency. Catheter secured externally with 0 Prolene suture and StatLock, and placed to external drain bag. Urine sample sent for Gram stain and culture. COMPLICATIONS: COMPLICATIONS none IMPRESSION: 1. Technically successful left percutaneous nephrostomy catheter placement. 2. Left nephrolithiasis with attenuated central renal collecting system. Electronically Signed   By: Lucrezia Europe M.D.   On: 12/25/2017 10:30    Assessment/Plan: (L)hydro from obstructing stone. S/p (L)PCN Good clear UOP, Cr down to 1.35    LOS: 2 days   I spent a total of 15 minutes in face to face in clinical consultation,  greater than 50% of which was counseling/coordinating care for (L)PCN  Ascencion Dike PA-C 12/26/2017 10:28 AM

## 2017-12-26 NOTE — Progress Notes (Addendum)
Louretta Parma, NP in department, informed NP pt reports "decreased sensation numbness and hurting in both feet". Vascular assessment by this rn unchanged from earlier assessment. NP to bedside to assess sensation, pt full sensation remains intact. Femoral a-line remains unremarkable. NP aware sbp 170s by a-line, NP states will order prn med for bp.

## 2017-12-27 LAB — BASIC METABOLIC PANEL
Anion gap: 9 (ref 5–15)
BUN: 29 mg/dL — ABNORMAL HIGH (ref 6–20)
CALCIUM: 8.6 mg/dL — AB (ref 8.9–10.3)
CO2: 21 mmol/L — ABNORMAL LOW (ref 22–32)
Chloride: 113 mmol/L — ABNORMAL HIGH (ref 98–111)
Creatinine, Ser: 1.11 mg/dL — ABNORMAL HIGH (ref 0.44–1.00)
GFR calc Af Amer: >60 mL/min (ref >60)
GFR, EST NON AFRICAN AMERICAN: 56 mL/min — AB (ref >60)
GLUCOSE: 120 mg/dL — AB (ref 70–99)
Potassium: 3 mmol/L — ABNORMAL LOW (ref 3.5–5.1)
Sodium: 143 mmol/L (ref 135–145)

## 2017-12-27 LAB — CBC
HCT: 29.4 % — ABNORMAL LOW (ref 36.0–46.0)
Hemoglobin: 10 g/dL — ABNORMAL LOW (ref 12.0–15.0)
MCH: 31.2 pg (ref 26.0–34.0)
MCHC: 34 g/dL (ref 30.0–36.0)
MCV: 91.6 fL (ref 78.0–100.0)
PLATELETS: 306 10*3/uL (ref 150–400)
RBC: 3.21 MIL/uL — AB (ref 3.87–5.11)
RDW: 15.9 % — ABNORMAL HIGH (ref 11.5–15.5)
WBC: 10.9 10*3/uL — AB (ref 4.0–10.5)

## 2017-12-27 LAB — GLUCOSE, CAPILLARY
GLUCOSE-CAPILLARY: 59 mg/dL — AB (ref 70–99)
GLUCOSE-CAPILLARY: 114 mg/dL — AB (ref 70–99)

## 2017-12-27 MED ORDER — AMLODIPINE BESYLATE 5 MG PO TABS: 5.0000 mg | ORAL_TABLET | Freq: Every day | ORAL | Status: DC

## 2017-12-27 MED ORDER — AMLODIPINE BESYLATE 5 MG PO TABS
5.0000 mg | ORAL_TABLET | Freq: Every day | ORAL | Status: DC
  Administered 2017-12-28 – 2017-12-29 (×2): 5 mg via ORAL
  Filled 2017-12-27 (×2): qty 1

## 2017-12-27 MED ORDER — POTASSIUM CHLORIDE CRYS ER 20 MEQ PO TBCR
30.0000 meq | EXTENDED_RELEASE_TABLET | ORAL | Status: DC
  Administered 2017-12-27: 30 meq via ORAL
  Filled 2017-12-27: qty 1

## 2017-12-27 MED ORDER — POTASSIUM CHLORIDE CRYS ER 20 MEQ PO TBCR
30.0000 meq | EXTENDED_RELEASE_TABLET | ORAL | Status: AC
  Administered 2017-12-27 (×2): 30 meq via ORAL
  Filled 2017-12-27 (×2): qty 1

## 2017-12-27 NOTE — Progress Notes (Signed)
[place] TEAM 1 - Stepdown/ICU TEAM  NIKKO QUAST  FXT:[id number] DOB: 02/26/1966 DOA: 12/23/2017 PCP: Tamsen Roers, MD    Brief Narrative:  52 year old female admitted 10/4 for renal failure, UTI, and metabolic acidosis. Course complicated by profound hypotension.  Significant Events: 10/4 admitted for sepsis 10/5 IR nephrostomy   Subjective: Flat affect. Denies nausea, vomiting, cp, sob, abdom pain. Reports very poor appetite. Has not yet been up/out of bed to a signif degree.   Assessment & Plan:  Sepsis secondary to complicated E coli urinary tract infection Cont rocephin IV for now - clinically making progress   Uncontrolled Hypertension  Adjust medical tx - follow trend - further titration of meds likely to be required   Acute renal failure on CKD3 Multifactorial: NSAID overdose, poor PO intake, bilateral nephrolithiasis, sepsis - s/p L nephrostomy tube - crt steadily improving and now near normal - will f/u w/ Nephrology as outpt  Recent Labs  Lab 12/25/17 0322 12/25/17 1639 12/26/17 0538 12/26/17 1509 12/27/17 0440  CREATININE 2.35* 1.86* 1.35* 1.14* 1.11*    B nephrolithiasis w/ large L stone  S/p perc neph drain 10/5 - definitive stone tx planned in elective setting later once acute issues have resolved - Urology following   Chronic heavy NSAID use (1g Q4hrs) - Migraines  Will need new tx regimen to avoid abuse of NSAIDs - no HA at this time   Refractory Hypokalemia Mg has been normal - cont to replace and follow   Obesity - Body mass index is 40.63 kg/m.   DVT prophylaxis: SQ heparin  Code Status: FULL CODE Family Communication: no family present at time of exam  Disposition Plan: stable for tele bed - begin PT/OT - follow lytes   Consultants:  PCCM Urology  Nephrology   Antimicrobials:  Rocephin 10/6 > Ceftaz 10/4 > 10/5 Cipro 10/4 > 10/5   Objective: Blood pressure 118/86, pulse 61, temperature 98.2 F (36.8 C), temperature  source Oral, resp. rate 14, height 5' 2.99" (1.6 m), weight 104 kg, SpO2 90 %.  Intake/Output Summary (Last 24 hours) at 12/27/2017 0843 Last data filed at 12/27/2017 0800 Gross per 24 hour  Intake 540 ml  Output 5975 ml  Net -5435 ml   Filed Weights   12/23/17 2121 12/24/17 1448 12/26/17 0204  Weight: 124.7 kg 104.7 kg 104 kg    Examination: General: No acute respiratory distress Lungs: Clear to auscultation bilaterally without wheezes or crackles Cardiovascular: Regular rate and rhythm without murmur gallop or rub normal S1 and S2 Abdomen: Nontender, nondistended, soft, bowel sounds positive, no rebound, no ascites, no appreciable mass Extremities: No significant cyanosis, clubbing, or edema bilateral lower extremities  CBC: Recent Labs  Lab 12/23/17 2123  12/25/17 0322 12/26/17 0538 12/27/17 0440  WBC 20.2*   < > 9.5 11.7* 10.9*  NEUTROABS 17.3*  --   --   --   --   HGB 15.2*   < > 10.6* 10.1* 10.0*  HCT 45.9   < > 30.5* 28.7* 29.4*  MCV 94.1   < > 88.9 89.7 91.6  PLT 381   < > 315 300 306   < > = values in this interval not displayed.   Basic Metabolic Panel: Recent Labs  Lab 12/24/17 1324 12/25/17 0322  12/26/17 0538 12/26/17 1509 12/27/17 0440  NA 140 141   < > 141 140 143  K 2.7* 2.4*   < > 2.8* 2.2* 3.0*  CL 112* 111   < >  114* 111 113*  CO2 9* 15*   < > 19* 22 21*  GLUCOSE 61* 170*   < > 111* 147* 120*  BUN 70* 57*   < > 40* 32* 29*  CREATININE 3.30* 2.35*   < > 1.35* 1.14* 1.11*  CALCIUM 6.0* 6.3*   < > 7.4* 8.1* 8.6*  MG 2.0 1.9  --  2.4  --   --   PHOS  --   --   --  2.1*  --   --    < > = values in this interval not displayed.   GFR: Estimated Creatinine Clearance: 69.1 mL/min (A) (by C-G formula based on SCr of 1.11 mg/dL (H)).  Liver Function Tests: Recent Labs  Lab 12/23/17 2123  AST 19  ALT 17  ALKPHOS 89  BILITOT 1.5*  PROT 6.6  ALBUMIN 3.1*    Coagulation Profile: Recent Labs  Lab 12/23/17 2123  INR 1.38    CBG: Recent  Labs  Lab 12/26/17 0820 12/26/17 1208 12/26/17 1744 12/26/17 2233 12/27/17 0835  GLUCAP 131* 168* 117* 148* 114*    Recent Results (from the past 240 hour(s))  Urine culture     Status: Abnormal   Collection Time: 12/23/17  9:28 PM  Result Value Ref Range Status   Specimen Description URINE, RANDOM  Final   Special Requests   Final    NONE Performed at [facility], [place] [address] [place], [place] [address]    Culture >=100,000 COLONIES/mL ESCHERICHIA COLI (A)  Final   Report Status 12/26/2017 FINAL  Final   Organism ID, Bacteria ESCHERICHIA COLI (A)  Final      Susceptibility   Escherichia coli - MIC*    AMPICILLIN >=32 RESISTANT Resistant     CEFAZOLIN <=4 SENSITIVE Sensitive     CEFTRIAXONE <=1 SENSITIVE Sensitive     CIPROFLOXACIN >=4 RESISTANT Resistant     GENTAMICIN <=1 SENSITIVE Sensitive     IMIPENEM <=0.25 SENSITIVE Sensitive     NITROFURANTOIN <=16 SENSITIVE Sensitive     TRIMETH/SULFA <=20 SENSITIVE Sensitive     AMPICILLIN/SULBACTAM 16 INTERMEDIATE Intermediate     PIP/TAZO <=4 SENSITIVE Sensitive     Extended ESBL NEGATIVE Sensitive     * >=100,000 COLONIES/mL ESCHERICHIA COLI  Culture, blood (Routine x 2)     Status: None (Preliminary result)   Collection Time: 12/23/17  9:46 PM  Result Value Ref Range Status   Specimen Description BLOOD BLOOD RIGHT FOREARM  Final   Special Requests   Final    BOTTLES DRAWN AEROBIC AND ANAEROBIC Blood Culture results may not be optimal due to an inadequate volume of blood received in culture bottles   Culture   Final    NO GROWTH 4 DAYS Performed at [facility] [address], [place], [place] [address]    Report Status PENDING  Incomplete  Culture, blood (Routine x 2)     Status: None (Preliminary result)   Collection Time: 12/23/17 10:55 PM  Result Value Ref Range Status   Specimen Description BLOOD RIGHT HAND  Final   Special Requests   Final    BOTTLES DRAWN AEROBIC ONLY Blood Culture results may  not be optimal due to an inadequate volume of blood received in culture bottles   Culture   Final    NO GROWTH 3 DAYS Performed at [facility], [place] [address] [place], [place] [address]    Report Status PENDING  Incomplete  Blood  culture (routine x 2)     Status: None (Preliminary result)   Collection Time: 12/24/17  5:00 AM  Result Value Ref Range Status   Specimen Description BLOOD LEFT ARM  Final   Special Requests   Final    BOTTLES DRAWN AEROBIC ONLY Blood Culture results may not be optimal due to an inadequate volume of blood received in culture bottles   Culture   Final    NO GROWTH 3 DAYS Performed at [facility], [place] [address], [place], [place] [address]    Report Status PENDING  Incomplete  MRSA PCR Screening     Status: None   Collection Time: 12/24/17  3:10 PM  Result Value Ref Range Status   MRSA by PCR NEGATIVE NEGATIVE Final    Comment:        The GeneXpert MRSA Assay (FDA approved for NASAL specimens only), is one component of a comprehensive MRSA colonization surveillance program. It is not intended to diagnose MRSA infection nor to guide or monitor treatment for MRSA infections. Performed at [facility], [place] [address] [place], [place] [address]   Urine Culture     Status: Abnormal (Preliminary result)   Collection Time: 12/25/17  9:44 AM  Result Value Ref Range Status   Specimen Description KIDNEY LEFT NEPHROSTOMY  Final   Special Requests NONE  Final   Culture (A)  Final    >=100,000 COLONIES/mL ESCHERICHIA COLI CULTURE REINCUBATED FOR BETTER GROWTH Performed at [facility], [place] [address] [place], [place] [address]    Report Status PENDING  Incomplete     Scheduled Meds: . amLODipine  10 mg Oral Daily  . chlorhexidine  15 mL Mouth Rinse BID  . heparin injection (subcutaneous)  5,000 Units Subcutaneous Q8H  . insulin aspart  0-20 Units Subcutaneous TID WC  . insulin aspart  0-5 Units Subcutaneous QHS  . mouth  rinse  15 mL Mouth Rinse q12n4p  . nystatin  5 mL Oral QID  . potassium chloride  30 mEq Oral Q4H  . sodium bicarbonate  1,300 mg Oral TID  . sodium chloride flush  5 mL Intracatheter Q8H   Continuous Infusions: . sodium chloride 10 mL/hr at 12/26/17 1800  . cefTRIAXone (ROCEPHIN)  IV 1 g (12/26/17 1708)     LOS: 3 days   Cherene Altes, MD [facility] Office  [phone] Pager - Text Page per Amion  If 7PM-7AM, please contact night-coverage per Amion 12/27/2017, 8:43 AM

## 2017-12-27 NOTE — Progress Notes (Signed)
[facility] Progress Note   Subjective:   BPs up Afebrile 5.9L UOP yesterda UCx was E coli on Ceftriaxone Hb 10 K 3.0, replaced already Pt no c/o  Objective Vitals:   12/27/17 0546 12/27/17 0600 12/27/17 0700 12/27/17 0744  BP: 118/86     Pulse: 61 64 61   Resp: 18 16 14    Temp:    98.2 F (36.8 C)  TempSrc:    Oral  SpO2: 90% 91% 90%   Weight:      Height:       Physical Exam General: chronically ill appearing, comfortable CV: RRR, II/VI syst murmur Lungs: clear  Abdomen: soft, nontender Extremities: no edema Skin: dry, no rashes, warm Neuro: oriented, conversant GU: foley draining bloody urine.  L urostomy dressing c/d/i  Additional Objective Labs: Basic Metabolic Panel: Recent Labs  Lab 12/26/17 0538 12/26/17 1509 12/27/17 0440  NA 141 140 143  K 2.8* 2.2* 3.0*  CL 114* 111 113*  CO2 19* 22 21*  GLUCOSE 111* 147* 120*  BUN 40* 32* 29*  CREATININE 1.35* 1.14* 1.11*  CALCIUM 7.4* 8.1* 8.6*  PHOS 2.1*  --   --    Liver Function Tests: Recent Labs  Lab 12/23/17 2123  AST 19  ALT 17  ALKPHOS 89  BILITOT 1.5*  PROT 6.6  ALBUMIN 3.1*   No results for input(s): LIPASE, AMYLASE in the last 168 hours. CBC: Recent Labs  Lab 12/23/17 2123 12/24/17 0500 12/25/17 0322 12/26/17 0538 12/27/17 0440  WBC 20.2* 17.2* 9.5 11.7* 10.9*  NEUTROABS 17.3*  --   --   --   --   HGB 15.2* 12.3 10.6* 10.1* 10.0*  HCT 45.9 36.3 30.5* 28.7* 29.4*  MCV 94.1 91.2 88.9 89.7 91.6  PLT 381 304 315 300 306   Blood Culture    Component Value Date/Time   SDES KIDNEY LEFT NEPHROSTOMY 12/25/2017 0944   SPECREQUEST NONE 12/25/2017 0944   CULT (A) 12/25/2017 0944    >=100,000 COLONIES/mL ESCHERICHIA COLI CULTURE REINCUBATED FOR BETTER GROWTH Performed at [facility] [address], [place], [place] [address]    REPTSTATUS PENDING 12/25/2017 5427    Cardiac Enzymes: No results for input(s): CKTOTAL, CKMB, CKMBINDEX, TROPONINI in the last  168 hours. CBG: Recent Labs  Lab 12/26/17 0820 12/26/17 1208 12/26/17 1744 12/26/17 2233 12/27/17 0835  GLUCAP 131* 168* 117* 148* 114*   Iron Studies: No results for input(s): IRON, TIBC, TRANSFERRIN, FERRITIN in the last 72 hours. @lablastinr3 @ Studies/Results: Ir Nephrostomy Placement Left  Result Date: 12/25/2017 CLINICAL DATA:  Left nephrolithiasis EXAM: LEFT PERCUTANEOUS NEPHROSTOMY CATHETER PLACEMENT UNDER ULTRASOUND AND FLUOROSCOPIC GUIDANCE FLUOROSCOPY TIME:  2.6 minute; 327 uGym2 DAP TECHNIQUE: The procedure, risks (including but not limited to bleeding, infection, organ damage ), Benefits, and alternatives were explained to the patient. Questions regarding the procedure were encouraged and answered. The patient understands and consents to the procedure. Survey ultrasound of the left flank was performed, the kidney localized, and an appropriate skin entry site determined. Leftflank region prepped with Betadine, draped in usual sterile fashion, infiltrated locally with 1% lidocaine. Intravenous Fentanyl and Versed were administered as conscious sedation during continuous monitoring of the patient's level of consciousness and physiological / cardiorespiratory status by the radiology RN, with a total moderate sedation time of 17 minutes. Under real-time ultrasound guidance, a 21-gauge trocar needle was advanced into a posterior lower pole calyx. Ultrasound image documentation was saved. Cloudy urine spontaneously returned through the needle. Needle was  exchanged over a guidewire for transitional dilator. Contrast injection confirmed appropriate positioning. A small central renal collecting system was identified. Large lower pole calyceal diverticulum corresponding to CT findings, containing calculi. Proximal left ureter patent and nondilated. Catheter was exchanged over a guidewire for a 10 French Dawson Mueller pigtail catheter, formed centrally within the upper pole moiety of the renal  collecting system. Contrast injection confirms appropriate positioning and patency. Catheter secured externally with 0 Prolene suture and StatLock, and placed to external drain bag. Urine sample sent for Gram stain and culture. COMPLICATIONS: COMPLICATIONS none IMPRESSION: 1. Technically successful left percutaneous nephrostomy catheter placement. 2. Left nephrolithiasis with attenuated central renal collecting system. Electronically Signed   By: Lucrezia Europe M.D.   On: 12/25/2017 10:30   Medications: . sodium chloride 10 mL/hr at 12/26/17 1800  . cefTRIAXone (ROCEPHIN)  IV 1 g (12/26/17 1708)   . amLODipine  10 mg Oral Daily  . chlorhexidine  15 mL Mouth Rinse BID  . heparin injection (subcutaneous)  5,000 Units Subcutaneous Q8H  . insulin aspart  0-20 Units Subcutaneous TID WC  . insulin aspart  0-5 Units Subcutaneous QHS  . mouth rinse  15 mL Mouth Rinse q12n4p  . nystatin  5 mL Oral QID  . potassium chloride  30 mEq Oral Q4H  . sodium bicarbonate  1,300 mg Oral TID  . sodium chloride flush  5 mL Intracatheter Q8H    Dialysis Orders:  Assessment:  80F with AoCKD3 and severe inc AG metabolic acidosis  1. Resolved AoCKD3 likely from acute and chronic NSAID use (either hemodynamic ATN and/or AIN type), 2. Hypotension suspect hypovolemia + sepsis (E coli UTI); resolved 3. Resolved severe metabolic acidosis with inc AG, lactae WNL, urine ketones negative. Suspect AG multifactorial with starvation ketoacidosis (elevated beta hydroxybutyric acid) + AKI. Serum HCO3 > 20 past 2 days 4. B/l nephrolithiasis including large L sided stones s/p perc neph drain 10/5 5. E coli UTI with sepsis 6. Chronic heavy NSAID use (1g q4h) 7. Migraines 8. BPAD 9. Severe hypokalemia, K improved to 3.0 this AM   Plan:  1.  Continue oral na bicarb 1300 TID for now.   2.  Cont K repletion 3. Will need migraine management plan prior to discharge that does not include NSAIDs.  4. Urology following for  definitive stone management - planned in elective setting later once acute issues have resolved. 5.  Will sign off for now.  Please call with any questions or concerns.  Pt does need follow up with nephrology and I will arrange to follw up with me and have labs   Doran Stabler 12/27/2017, 8:47 AM  [employer]

## 2017-12-27 NOTE — Evaluation (Signed)
Physical Therapy Evaluation Patient Details Name: Pamela Carney MRN: [id number] DOB: [birth year] Today's Date: 12/27/2017   History of Present Illness  52 yo admitted with sepsis due to complicated UTI with AKF, metabolic acidosis, 26/8 nephrostomy. PMHx: chronic migraine with NSAID use, HTN, AFib, bipolar  Clinical Impression  Pt pleasant on arrival, reporting has not moved much over last two months since all of this has started. Pt with increased time for processing and responses with trouble recalling home layout. Question accuracy of historian account, may benefit to discuss with family home set up and what cognitive baseline is. Pt with min guard for all mobility, with ambulation of 11ft. Pt with decreased strength, endurance, balance (see PT problem list below). Pt will benefit acutely from skilled therapy to maximize functional mobility, independence, safety and activity tolerance. After pt mobilized, nurse discussed pt was suppose to be on bed rest due to femoral artery line pull for at least an hour, but nurse unaware of what time line was pulled. No active orders or notes in file alerting to bedrest status. Pt returned to bed after session, bandaging check with no active bleeding observed.       Follow Up Recommendations Supervision/Assistance - 24 hour;SNF    Equipment Recommendations  Rolling walker with 5" wheels    Recommendations for Other Services       Precautions / Restrictions Precautions Precautions: Fall      Mobility  Bed Mobility Overal bed mobility: Needs Assistance Bed Mobility: Supine to Sit;Sit to Supine     Supine to sit: Supervision Sit to supine: Min guard   General bed mobility comments: with rail, increased time and cues for use of rail with HOB 20degrees  Transfers Overall transfer level: Needs assistance   Transfers: Sit to/from Stand;Stand Pivot Transfers Sit to Stand: Min guard Stand pivot transfers: Min assist       General transfer  comment: cues for hand placement with increased time. min assist without AD to pivot from chair to bed  Ambulation/Gait Ambulation/Gait assistance: Min guard Gait Distance (Feet): 18 Feet Assistive device: Rolling walker (2 wheeled) Gait Pattern/deviations: Step-through pattern;Decreased stride length;Trunk flexed   Gait velocity interpretation: <1.8 ft/sec, indicate of risk for recurrent falls General Gait Details: chair follow with pt fatiguing quickly with encouragement to maximize distance  Stairs            Wheelchair Mobility    Modified Rankin (Stroke Patients Only)       Balance Overall balance assessment: Needs assistance   Sitting balance-Leahy Scale: Fair     Standing balance support: Bilateral upper extremity supported Standing balance-Leahy Scale: Poor                               Pertinent Vitals/Pain Pain Assessment: 0-10 Pain Score: 8  Pain Location: drain insertion  Pain Descriptors / Indicators: Sharp;Discomfort;Grimacing Pain Intervention(s): Limited activity within patient's tolerance;Repositioned;Monitored during session    Home Living Family/patient expects to be discharged to:: Private residence Living Arrangements: Spouse/significant other;Children Available Help at Discharge: Family;Available PRN/intermittently Type of Home: House Home Access: Stairs to enter Entrance Stairs-Rails: (cannot recall, thinks has 1 ) Entrance Stairs-Number of Steps: 3 Home Layout: Two level;Bed/bath upstairs;Able to live on main level with bedroom/bathroom(sleeping in recliner downstairs ) Home Equipment: None Additional Comments: pt sleeps in recliner, for the last 2 months has not been moving more than a few feet in the house. She states before  that she was walking in the community. Spouse works 12 hrs shifts, oldest daughter works, youngest is 32 and in school    Prior Function Level of Independence: Independent         Comments: limited  last two months, pt's daughter does grocery shopping, but is started new job and will be gone for long periods of time.      Hand Dominance        Extremity/Trunk Assessment   Upper Extremity Assessment Upper Extremity Assessment: Generalized weakness    Lower Extremity Assessment Lower Extremity Assessment: Generalized weakness    Cervical / Trunk Assessment Cervical / Trunk Assessment: Kyphotic  Communication   Communication: No difficulties  Cognition Arousal/Alertness: Awake/alert Behavior During Therapy: Flat affect Overall Cognitive Status: Impaired/Different from baseline Area of Impairment: Memory;Problem solving                     Memory: Decreased short-term memory       Problem Solving: Slow processing;Requires verbal cues General Comments: some response to questions were not appropraite, trouble remembering home layout during intake.       General Comments      Exercises     Assessment/Plan    PT Assessment Patient needs continued PT services  PT Problem List Decreased strength;Decreased mobility;Decreased safety awareness;Decreased activity tolerance;Decreased balance;Decreased knowledge of use of DME;Decreased knowledge of precautions;Decreased cognition       PT Treatment Interventions Gait training;Therapeutic activities;Stair training;Therapeutic exercise;DME instruction;Functional mobility training;Balance training;Patient/family education    PT Goals (Current goals can be found in the Care Plan section)  Acute Rehab PT Goals Patient Stated Goal: go to the bathroom and move more PT Goal Formulation: With patient Time For Goal Achievement: 01/10/18 Potential to Achieve Goals: Fair    Frequency Min 3X/week   Barriers to discharge Decreased caregiver support      Co-evaluation               AM-PAC PT "6 Clicks" Daily Activity  Outcome Measure Difficulty turning over in bed (including adjusting bedclothes, sheets and  blankets)?: A Little Difficulty moving from lying on back to sitting on the side of the bed? : Unable Difficulty sitting down on and standing up from a chair with arms (e.g., wheelchair, bedside commode, etc,.)?: A Little Help needed moving to and from a bed to chair (including a wheelchair)?: A Little Help needed walking in hospital room?: A Little Help needed climbing 3-5 steps with a railing? : A Lot 6 Click Score: 15    End of Session Equipment Utilized During Treatment: Gait belt Activity Tolerance: Patient limited by fatigue Patient left: in bed;with call bell/phone within reach;with bed alarm set Nurse Communication: Mobility status PT Visit Diagnosis: Other abnormalities of gait and mobility (R26.89);Muscle weakness (generalized) (M62.81)    Time: 6301-6010 PT Time Calculation (min) (ACUTE ONLY): 37 min   Charges:   PT Evaluation $PT Eval Moderate Complexity: 1 Mod PT Treatments $Therapeutic Activity: 8-22 mins        Samuella Bruin, [place]  Acute Rehab [phone]   Samuella Bruin 12/27/2017, 2:03 PM

## 2017-12-27 NOTE — Progress Notes (Signed)
Admission note:  Arrival Method: Patient arrived from 19M. Mental Orientation: Alert and oriented x 3, disoriented to situation. Telemetry: 5M21, NSR Assessment: see doc flow sheets. IV: Right forearm, SL. Pain: 8/10, vicodin given Tubes: Foley and nephrostomy stent. Safety Measures: bed in low position, call bell and phone within reach. Fall Prevention Safety Plan: reviewed the plan, verbalized understanding. Admission Screening: completed. 6700 Orientation: Patient has been oriented to the unit, staff and to the room.

## 2017-12-27 NOTE — Progress Notes (Signed)
[facility] ADULT ICU REPLACEMENT PROTOCOL FOR AM LAB REPLACEMENT ONLY  The patient does apply for the [facility] Adult ICU Electrolyte Replacment Protocol based on the criteria listed below:   1. Is GFR >/= 40 ml/min? Yes.    Patient's GFR today is >60 2. Is urine output >/= 0.5 ml/kg/hr for the last 6 hours? Yes.   Patient's UOP is 1.2 ml/kg/hr 3. Is BUN < 60 mg/dL? Yes.    Patient's BUN today is 29 4. Abnormal electrolyte(s): k 3.0 5. Ordered repletion with: protocol 6. If a panic level lab has been reported, has the CCM MD in charge been notified? No..   Physician:    Ronda Fairly A 12/27/2017 6:01 AM

## 2017-12-27 NOTE — Progress Notes (Signed)
Referring Physician(s): Dr. Tresa Moore  Supervising Physician: Marybelle Killings  Patient Status:  [name] - In-pt  Chief Complaint: Follow-up left nephrostomy placed 10/5 by Dr. Vernard Gambles  Subjective: 52 y/o F with past medical history significant for migraines presented to [facility] ED on 10/3 with complaints of dysuria and hematuria x 2 months as well as new onset generalized weakness. 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 She was found to have a UTI, renal failure and metabolic acidosis for which she was admitted for further evaluation and management.  Urology was consulted on 10/4 who requested left percutaneous nephrostomy placement in IR for renal decompression as well as future access for stone surgery. Left PCN was placed by Dr. Vernard Gambles on 09/2 without complication.  Patient reports she feels ok, states she is somewhat better but still feels "weird." She reports poor appetite, no BMs, denies abdominal pain, flank pain, n/v. She states she constantly feels like she needs to urinate.  Allergies: Sulfa antibiotics; Latex; and Tape  Medications: Prior to Admission medications   Medication Sig Start Date End Date Taking? Authorizing Provider  ibuprofen (ADVIL,MOTRIN) 200 MG tablet Take 1,000 mg by mouth every 4 (four) hours as needed (FOR PAIN).    Yes [provider]  Multiple Vitamins-Minerals (ONE-A-DAY WOMENS 50+ ADVANTAGE) TABS Take 1 tablet by mouth daily.   Yes [provider]  ALPRAZolam Duanne Moron) 0.5 MG tablet Take 0.5 mg by mouth 4 (four) times daily.    [provider]  bisoprolol-hydrochlorothiazide (ZIAC) 10-6.25 MG per tablet Take 1 tablet by mouth daily.    [provider]  butalbital-acetaminophen-caffeine (FIORICET WITH CODEINE) 50-325-40-30 MG per capsule Take 2 capsules by mouth 3 (three) times daily.    [provider]    divalproex (DEPAKOTE ER) 500 MG 24 hr tablet Take 2,000 mg by mouth daily.    [provider]  lamoTRIgine (LAMICTAL) 100 MG tablet Take 100 mg by mouth daily.    [provider]  losartan (COZAAR) 25 MG tablet Take 25 mg by mouth daily.    [provider]     Vital Signs: BP (!) 145/86 (BP Location: Right Arm)   Pulse 67   Temp 98.3 F (36.8 C) (Oral)   Resp 18   Ht 5' 2.99" (1.6 m)   Wt 229 lb 4.5 oz (104 kg)   SpO2 (!) 88%   BMI 40.63 kg/m   Physical Exam  Constitutional: No distress.  HENT:  Head: Normocephalic.  Cardiovascular: Normal rate, regular rhythm and normal heart sounds.  Pulmonary/Chest: Effort normal and breath sounds normal.  Abdominal: Soft. She exhibits no distension. There is no tenderness.  Neurological: She is alert.  Skin: Skin is warm and dry. She is not diaphoretic.  Left PCN insertion site clean, dry, intact - no pain on palpation, erythema, edema, bleeding or discharge. Clear light yellow urine in gravity bag.  Psychiatric: She has a normal mood and affect. Her behavior is normal. Judgment and thought content normal.  Nursing note and vitals reviewed.   Imaging: Dg Chest 2 View  Result Date: 12/23/2017 CLINICAL DATA:  Dysuria, weakness EXAM: CHEST - 2 VIEW COMPARISON:  None. FINDINGS: Lungs are clear.  No pleural effusion or pneumothorax. The heart is normal in size. Visualized osseous structures are within normal limits. IMPRESSION: Normal chest radiographs. Electronically Signed   By: Henderson Newcomer.D.  On: 12/23/2017 22:06   Ct Renal Stone Study  Result Date: 12/23/2017 CLINICAL DATA:  Dysuria, weakness EXAM: CT ABDOMEN AND PELVIS WITHOUT CONTRAST TECHNIQUE: Multidetector CT imaging of the abdomen and pelvis was performed following the standard protocol without IV contrast. COMPARISON:  12/18/2013 FINDINGS: Lower chest: Lung bases are clear. Hepatobiliary: Mild hepatic steatosis. Layering tiny gallstones (series  3/image 29), without associated inflammatory changes. No intrahepatic or extrahepatic ductal dilatation. Pancreas: Within normal limits. Spleen: Within normal limits. Adrenals/Urinary Tract: 2.9 cm right adrenal adenoma (series 3/image 19), previously 2.2 cm. Left adrenal gland is within normal limits. Right kidney is notable for a 5 mm nonobstructing right lower pole renal calculus (series 3/image 41). Vascular calcifications. No hydronephrosis. 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 Layering calcifications in a left lower pole calyceal diverticulum (series 3/image 39). No hydronephrosis. Bladder is notable for nondependent gas. Stomach/Bowel: Stomach is within normal limits. No evidence of bowel obstruction. Normal appendix (series 3/image 58). Sigmoid diverticulosis, without evidence of diverticulitis. Vascular/Lymphatic: No evidence of abdominal aortic aneurysm. Prominent para-aortic nodes measuring up to 10 mm short axis (series 3/image 34), at the upper limits of normal. Reproductive: Uterine IUD, mildly obliqued, with the right arm possibly embedded in the myometrium (series 3/image 69). Bilateral ovaries are unremarkable. Other: No abdominopelvic ascites. Tiny fat containing periumbilical hernia (series 3/image 62). Musculoskeletal: Mild degenerative changes of the visualized thoracolumbar spine. 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 No hydronephrosis. Nondependent gas in the bladder, correlate for cystitis. No evidence of bowel obstruction. Normal appendix. Sigmoid diverticulosis, without evidence of diverticulitis. Cholelithiasis, without associated inflammatory changes. Uterine IUD may be mildly obliqued, with its right arm possibly embedded in the myometrium. Electronically Signed    By: Julian Hy M.D.   On: 12/23/2017 23:57   Ir Nephrostomy Placement Left  Result Date: 12/25/2017 CLINICAL DATA:  Left nephrolithiasis EXAM: LEFT PERCUTANEOUS NEPHROSTOMY CATHETER PLACEMENT UNDER ULTRASOUND AND FLUOROSCOPIC GUIDANCE FLUOROSCOPY TIME:  2.6 minute; 327 uGym2 DAP TECHNIQUE: The procedure, risks (including but not limited to bleeding, infection, organ damage ), Benefits, and alternatives were explained to the patient. Questions regarding the procedure were encouraged and answered. The patient understands and consents to the procedure. Survey ultrasound of the left flank was performed, the kidney localized, and an appropriate skin entry site determined. Leftflank region prepped with Betadine, draped in usual sterile fashion, infiltrated locally with 1% lidocaine. Intravenous Fentanyl and Versed were administered as conscious sedation during continuous monitoring of the patient's level of consciousness and physiological / cardiorespiratory status by the radiology RN, with a total moderate sedation time of 17 minutes. Under real-time ultrasound guidance, a 21-gauge trocar needle was advanced into a posterior lower pole calyx. Ultrasound image documentation was saved. Cloudy urine spontaneously returned through the needle. Needle was exchanged over a guidewire for transitional dilator. Contrast injection confirmed appropriate positioning. A small central renal collecting system was identified. Large lower pole calyceal diverticulum corresponding to CT findings, containing calculi. Proximal left ureter patent and nondilated. Catheter was exchanged over a guidewire for a 10 French Dawson Mueller pigtail catheter, formed centrally within the upper pole moiety of the renal collecting system. Contrast injection confirms appropriate positioning and patency. Catheter secured externally with 0 Prolene suture and StatLock, and placed to external drain bag. Urine sample sent for Gram stain and  culture.  COMPLICATIONS: COMPLICATIONS none IMPRESSION: 1. Technically successful left percutaneous nephrostomy catheter placement. 2. Left nephrolithiasis with attenuated central renal collecting system. Electronically Signed   By: Lucrezia Europe M.D.   On: 12/25/2017 10:30    Labs:  CBC: Recent Labs    12/24/17 0500 12/25/17 0322 12/26/17 0538 12/27/17 0440  WBC 17.2* 9.5 11.7* 10.9*  HGB 12.3 10.6* 10.1* 10.0*  HCT 36.3 30.5* 28.7* 29.4*  PLT 304 315 300 306    COAGS: Recent Labs    12/23/17 2123  INR 1.38    BMP: Recent Labs    12/25/17 1639 12/26/17 0538 12/26/17 1509 12/27/17 0440  NA 140 141 140 143  K 2.3* 2.8* 2.2* 3.0*  CL 111 114* 111 113*  CO2 19* 19* 22 21*  GLUCOSE 263* 111* 147* 120*  BUN 47* 40* 32* 29*  CALCIUM 6.4* 7.4* 8.1* 8.6*  CREATININE 1.86* 1.35* 1.14* 1.11*  GFRNONAA 30* 45* 55* 56*  GFRAA 35* 52* >60 >60    LIVER FUNCTION TESTS: Recent Labs    12/23/17 2123  BILITOT 1.5*  AST 19  ALT 17  ALKPHOS 89  PROT 6.6  ALBUMIN 3.1*    Assessment and Plan:  AKI with UTI, sepsis and metabolic acidosis - request from urology for left PCN placement for decompression and future access for stone surgery which patient is to follow up with as an outpatient. Patient reports some improvement in symptoms, denies pain at PCN insertion site. 1900 cc OP from PCN recorded in last 24 hours, clear urine in gravity bag on exam today. She is afebrile, WBC improving now 10.9, creatinine improved to 1.11 as well.   IR to follow. Continue current management - d/c per admitting service.  Please call IR with questions or concerns.   Electronically Signed: Joaquim Nam, PA-C 12/27/2017, 2:32 PM   I spent a total of 25 Minutes at the the patient's bedside AND on the patient's hospital floor or unit, greater than 50% of which was counseling/coordinating care for left PCN.

## 2017-12-28 DIAGNOSIS — N183 Chronic kidney disease, stage 3 unspecified: Secondary | ICD-10-CM

## 2017-12-28 DIAGNOSIS — N200 Calculus of kidney: Secondary | ICD-10-CM

## 2017-12-28 DIAGNOSIS — N179 Acute kidney failure, unspecified: Secondary | ICD-10-CM

## 2017-12-28 HISTORY — DX: Acute kidney failure, unspecified: N17.9

## 2017-12-28 LAB — CBC
HCT: 32.4 % — ABNORMAL LOW (ref 36.0–46.0)
Hemoglobin: 10.6 g/dL — ABNORMAL LOW (ref 12.0–15.0)
MCH: 30.4 pg (ref 26.0–34.0)
MCHC: 32.7 g/dL (ref 30.0–36.0)
MCV: 92.8 fL (ref 80.0–100.0)
Platelets: 317 10*3/uL (ref 150–400)
RBC: 3.49 MIL/uL — ABNORMAL LOW (ref 3.87–5.11)
RDW: 16.1 % — ABNORMAL HIGH (ref 11.5–15.5)
WBC: 13.2 10*3/uL — ABNORMAL HIGH (ref 4.0–10.5)

## 2017-12-28 LAB — BASIC METABOLIC PANEL
Anion gap: 7 (ref 5–15)
BUN: 18 mg/dL (ref 6–20)
CALCIUM: 8.9 mg/dL (ref 8.9–10.3)
CO2: 25 mmol/L (ref 22–32)
Chloride: 109 mmol/L (ref 98–111)
Creatinine, Ser: 0.84 mg/dL (ref 0.44–1.00)
GFR calc Af Amer: >60 mL/min (ref >60)
GLUCOSE: 116 mg/dL — AB (ref 70–99)
Potassium: 3 mmol/L — ABNORMAL LOW (ref 3.5–5.1)
Sodium: 141 mmol/L (ref 135–145)

## 2017-12-28 LAB — HEMOGLOBIN A1C
HEMOGLOBIN A1C: 11.3 % — AB (ref 4.8–5.6)
Mean Plasma Glucose: 277.61 mg/dL

## 2017-12-28 LAB — CULTURE, BLOOD (ROUTINE X 2): Culture: NO GROWTH

## 2017-12-28 LAB — URINE CULTURE: Culture: >=100000 — AB

## 2017-12-28 LAB — MAGNESIUM: Magnesium: 1.6 mg/dL — ABNORMAL LOW (ref 1.7–2.4)

## 2017-12-28 MED ORDER — LAMOTRIGINE 100 MG PO TABS
100.0000 mg | ORAL_TABLET | Freq: Every day | ORAL | Status: DC
  Administered 2017-12-28 – 2017-12-29 (×2): 100 mg via ORAL
  Filled 2017-12-28 (×2): qty 1

## 2017-12-28 MED ORDER — SODIUM CHLORIDE 0.9 % IV SOLN
INTRAVENOUS | Status: DC | PRN
  Administered 2017-12-28: 10 mL via INTRAVENOUS

## 2017-12-28 MED ORDER — MAGNESIUM SULFATE 2 GM/50ML IV SOLN
2.0000 g | Freq: Once | INTRAVENOUS | Status: AC
  Administered 2017-12-28: 2 g via INTRAVENOUS
  Filled 2017-12-28: qty 50

## 2017-12-28 MED ORDER — DIVALPROEX SODIUM ER 500 MG PO TB24
2000.0000 mg | ORAL_TABLET | Freq: Every day | ORAL | Status: DC
  Administered 2017-12-28 – 2017-12-29 (×2): 2000 mg via ORAL
  Filled 2017-12-28 (×2): qty 4

## 2017-12-28 MED ORDER — POTASSIUM CHLORIDE CRYS ER 20 MEQ PO TBCR: 30.0000 meq | EXTENDED_RELEASE_TABLET | ORAL | Status: DC

## 2017-12-28 MED ORDER — POTASSIUM CHLORIDE CRYS ER 20 MEQ PO TBCR
40.0000 meq | EXTENDED_RELEASE_TABLET | ORAL | Status: AC
  Administered 2017-12-28 (×2): 40 meq via ORAL
  Filled 2017-12-28 (×2): qty 2

## 2017-12-28 MED ORDER — BISOPROLOL FUMARATE 5 MG PO TABS
10.0000 mg | ORAL_TABLET | Freq: Every day | ORAL | Status: DC
  Administered 2017-12-28 – 2017-12-29 (×2): 10 mg via ORAL
  Filled 2017-12-28 (×2): qty 2

## 2017-12-28 NOTE — Care Management Note (Addendum)
Case Management Note  Patient Details  Name: Pamela Carney MRN: [id number] Date of Birth: Nov 19, 1965  Subjective/Objective:  Pt presented for AKI- PTA from home with support of husband. Per patient husband works. OT recommendations for home with OT services. Pt states she can utilize [facility] RN for medication management. Pt will need orders for [facility] RN, OT- Agency List provided and patient chose [facility]. Referral made to [name] with [facility] to see what co pay will be for services.  [name] will make patient aware to see if patient can afford co pay.  Pt will need DME 3n1, RW and tub bench. [facility] will make patient aware of cost.         Action/Plan: CM will continue to monitor for additional needs.    Expected Discharge Date:              Expected Discharge Plan:  [place]  In-House Referral:  NA  Discharge planning Services  CM Consult  Post Acute Care Choice:  Durable Medical Equipment, Home Health Choice offered to:  Patient  DME Arranged:  3-N-1, Tub bench, Walker rolling DME Agency:  [place] Arranged:  RN, Disease Management, OT [place] Agency:  [place]  Status of Service:  Completed, signed off  If discussed at [place] of Stay Meetings, dates discussed:    Additional Comments: 1459 12-29-17 [name], RN,BSN [phone] CM did make the patient aware that she can call the 1-800 number on the [place] and they can get her a PCP in network. No further needs from CM at this time.    [place] 12-29-17 [name], RN, BSN [phone] Per [name] with [facility] patient will not have a co pay for [facility]. CM will continue to monitor for additional needs.  [name], RN 12/28/2017, 3:43 PM

## 2017-12-28 NOTE — Evaluation (Signed)
Occupational Therapy Evaluation Patient Details Name: Pamela Carney MRN: [id number] DOB: May 26, 1965 Today's Date: 12/28/2017    History of Present Illness 52 yo admitted with sepsis due to complicated UTI with AKF, metabolic acidosis, 17/5 nephrostomy. PMHx: chronic migraine with NSAID use, HTN, AFib, bipolar   Clinical Impression   Pt admitted with above. She demonstrates the below listed deficits and will benefit from continued OT to maximize safety and independence with BADLs.  Pt presents to OT with generalized weakness, decreased activity tolerance, impaired balance, impaired safety awareness,impaired judgement, as well as impaired problem solving (unsure of pt baseline).  She currently requires min - mod A for UB ADLs, and mod - max A for LB ADLs.  Min A required for functional transfers.  Pt reports she lives with spouse and 2 teenage daughters who will be at work and/or school during day at time of discharge.  Recommend she have 24 hour assist initially, and recommend HHOT.  Will follow acutely.       Follow Up Recommendations  Home health OT;Supervision/Assistance - 24 hour    Equipment Recommendations  3 in 1 bedside commode;Tub/shower bench    Recommendations for Other Services       Precautions / Restrictions Precautions Precautions: Fall      Mobility Bed Mobility Overal bed mobility: Needs Assistance Bed Mobility: Sit to Supine     Supine to sit: Min guard     General bed mobility comments: increased time   Transfers Overall transfer level: Needs assistance Equipment used: Rolling walker (2 wheeled) Transfers: Sit to/from [facility] Sit to Stand: Min guard Stand pivot transfers: Min assist       General transfer comment: assist to maneuver RW and assist to steady     Balance Overall balance assessment: Needs assistance Sitting-balance support: Feet supported Sitting balance-Leahy Scale: Fair     Standing balance support:  Bilateral upper extremity supported Standing balance-Leahy Scale: Poor Standing balance comment: reliant on UE support                            ADL either performed or assessed with clinical judgement   ADL Overall ADL's : Needs assistance/impaired Eating/Feeding: Independent   Grooming: Wash/dry hands;Wash/dry face;Oral care;Brushing hair;Set up;Sitting   Upper Body Bathing: Moderate assistance;Sitting Upper Body Bathing Details (indicate cue type and reason): assist to bathe under pannus and lower abdomen  Lower Body Bathing: Maximal assistance;Sit to/from stand   Upper Body Dressing : Minimal assistance;Sitting   Lower Body Dressing: Total assistance;Sit to/from stand   Toilet Transfer: Minimal Interior and spatial designer Details (indicate cue type and reason): verbal cues for hand placement and walker safety as well as assist maneuvering RW  Toileting- Clothing Manipulation and Hygiene: Total assistance;Sit to/from stand       Functional mobility during ADLs: Minimal assistance;Rolling walker       Vision         Perception     Praxis      Pertinent Vitals/Pain Pain Assessment: Faces Faces Pain Scale: Hurts little more Pain Location: drain insertion; reports groin pain with bed mobiltiy (groin pain better with standing), and nausea Pain Descriptors / Indicators: Discomfort;Grimacing Pain Intervention(s): Monitored during session     Hand Dominance     Extremity/Trunk Assessment Upper Extremity Assessment Upper Extremity Assessment: Generalized weakness   Lower Extremity Assessment Lower Extremity Assessment: Defer to PT evaluation   Cervical / Trunk Assessment Cervical / Trunk Assessment:  Other exceptions(pt with large pannus, flexed trunk )   Communication Communication Communication: No difficulties   Cognition Arousal/Alertness: Awake/alert Behavior During Therapy: WFL for tasks assessed/performed;Flat affect Overall Cognitive  Status: Impaired/Different from baseline Area of Impairment: Attention;Problem solving                   Current Attention Level: Selective         Problem Solving: Slow processing;Difficulty sequencing;Requires verbal cues General Comments: Pt is slow to process.  requires min verbal cues for problem solving.  Self distracts frequently    General Comments       Exercises     Shoulder Instructions      Home Living Family/patient expects to be discharged to:: Private residence Living Arrangements: Spouse/significant other;Children Available Help at Discharge: Family;Available PRN/intermittently Type of Home: House Home Access: Stairs to enter Entrance Stairs-Number of Steps: 3   Home Layout: Two level;Bed/bath upstairs;Able to live on main level with bedroom/bathroom Alternate Level Stairs-Number of Steps: flight  Alternate Level Stairs-Rails: None Bathroom Shower/Tub: Teacher, early years/pre: Standard     Home Equipment: None   Additional Comments: pt sleeps in recliner, for the last 2 months has not been moving more than a few feet in the house. She states before that she was walking in the community. Spouse works 12 hrs shifts, oldest daughter works, youngest is 71 and in school      Prior Functioning/Environment Level of Independence: Independent        Comments: limited last two months, pt's daughter does grocery shopping, but is started new job and will be gone for long periods of time.         OT Problem List: Decreased strength;Decreased activity tolerance;Impaired balance (sitting and/or standing);Decreased cognition;Decreased safety awareness;Decreased knowledge of use of DME or AE;Obesity;Pain      OT Treatment/Interventions: Self-care/ADL training;Therapeutic exercise;Energy conservation;DME and/or AE instruction;Therapeutic activities;Cognitive remediation/compensation;Patient/family education;Balance training    OT Goals(Current  goals can be found in the care plan section) Acute Rehab OT Goals Patient Stated Goal: to be able to take care of self  OT Goal Formulation: With patient Time For Goal Achievement: 01/11/18 Potential to Achieve Goals: Good ADL Goals Pt Will Perform Grooming: with supervision;standing Pt Will Perform Upper Body Bathing: with set-up;with supervision;sitting Pt Will Perform Lower Body Bathing: with min guard assist;sit to/from stand;with adaptive equipment Pt Will Perform Upper Body Dressing: with set-up;with supervision;sitting Pt Will Perform Lower Body Dressing: with min guard assist;sit to/from stand;with adaptive equipment Pt Will Transfer to Toilet: with min guard assist;ambulating;regular height toilet;bedside commode;grab bars Pt Will Perform Toileting - Clothing Manipulation and hygiene: with min guard assist;sit to/from stand Pt Will Perform Tub/Shower Transfer: Tub transfer;with min guard assist;ambulating;tub bench;rolling walker Pt/caregiver will Perform Home Exercise Program: Increased strength;Right Upper extremity;Left upper extremity;With theraband;With written HEP provided;Independently  OT Frequency: Min 2X/week   Barriers to D/C: Decreased caregiver support          Co-evaluation              AM-PAC PT "6 Clicks" Daily Activity     Outcome Measure Help from another person eating meals?: None Help from another person taking care of personal grooming?: A Little Help from another person toileting, which includes using toliet, bedpan, or urinal?: A Lot Help from another person bathing (including washing, rinsing, drying)?: A Lot Help from another person to put on and taking off regular upper body clothing?: A Little Help from another person to  put on and taking off regular lower body clothing?: Total 6 Click Score: 15   End of Session Equipment Utilized During Treatment: Rolling walker Nurse Communication: Mobility status  Activity Tolerance: Patient limited by  fatigue Patient left: in bed;with call bell/phone within reach  OT Visit Diagnosis: Unsteadiness on feet (R26.81)                Time: 1349-1406 OT Time Calculation (min): 17 min Charges:  OT General Charges $OT Visit: 1 Visit OT Evaluation $OT Eval Moderate Complexity: 1 Mod  Lucille Passy, OTR/L [place] Pager [phone] Office [phone]   Lucille Passy M 12/28/2017, 2:21 PM

## 2017-12-28 NOTE — Progress Notes (Signed)
PROGRESS NOTE  DOT SPLINTER BJY:[id number] DOB: [birth year] DOA: 12/23/2017 PCP: Tamsen Roers, MD  Brief Narrative: 52 year old woman PMH migraine headaches with excessive and daily ibuprofen use presented with dysuria onset 2 months prior, intermittent hematuria, low back pain, generalized weakness.  Found to have acute kidney injury, anion gap metabolic acidosis, bilateral nephrolithiasis, cystitis.  She became hypotensive and was transferred to the ICU.  Fortunately she responded to conservative management with IV fluids and antibiotics, she underwent percutaneous placement of a nephrostomy tube managed by IR.  Plan is for outpatient follow-up with urology for definitive treatment of stone.  Acute kidney injury resolved and she will follow-up with nephrology as an outpatient.  She continues to be treated for complicated UTI with hematuria.  She has been evaluated by physical therapy, she may need skilled nurse facility or may be on the go home in the next 48 hours.  Assessment/Plan Sepsis secondary to complicated UTI E. coli, with hematuria. --Sensitive to cephalosporins.  Acute kidney injury on CKD stage III with severe anion gap metabolic acidosis on admission, complicated by excessive daily ibuprofen use, hydrochlorothiazide, losartan.   --Resolved, secondary to acute and chronic NSAID use.  Nephrology has signed off but has arranged outpatient follow-up with Dr. Joelyn Oms.  Complicated staghorn calculus, nonobstructing, in the left --Status post percutaneous nephrostomy 10/5 --We will need outpatient follow-up with urology for definitive stone treatment  Migraine headaches.  Absolutely no NSAIDs on discharge.  Generalized weakness secondary to metabolic derangement as above --Continue PT may need skilled nursing facility versus home with home health.  Anxiety, bipolar 1 disorder --Stable.  Resume Depakote and Lamictal.  Essential hypertension --Hold hydrochlorothiazide, losartan.   Can resume bisoprolol 10 mg daily.  Possible oblique IUD, this can be followed up in the outpatient setting  EARLY SKIN breakdown sacrum, present on admission --wound care consulted  Essential hypertension.  Cuff measurements unreliable in the past.  DVT prophylaxis: heparin SQ Code Status: Full Family Communication: none Disposition Plan: home    Murray Hodgkins, MD  [facility] Direct contact: [phone] --Via amion app OR  --[email or web address]; password [email or web address]  7PM-7AM contact night coverage as above 12/28/2017, 2:52 PM  LOS: 4 days   Consultants:  Nephrology   Urology   Procedures:  10/5 percutaneous nephrostomy tube placement  Antimicrobials:  Ceftriaxone 10/3 >  Interval history/Subjective: Feels a lot better.  Breathing fine.  No complaints other than nausea.  Able to eat.  Objective: Vitals:  Vitals:   12/28/17 0506 12/28/17 0834  BP: (!) 152/89 (!) 156/101  Pulse: 77 74  Resp: 18 18  Temp: 98.3 F (36.8 C) 98.1 F (36.7 C)  SpO2: 91% 91%    Exam: Constitutional:   . Appears calm and comfortable Respiratory:  . CTA bilaterally, no w/r/r.  . Respiratory effort normal.  Cardiovascular:  . RRR, no m/r/g . No LE extremity edema   Psychiatric:  . Mental status o Mood, affect appropriate  I have personally reviewed the following:   Data: . I/O o UOP: 3650  CBG: CBG stable, no lows  Labs: Potassium 3.0, remainder BMP unremarkable.  Magnesium 1.6.  WBC slightly elevated, 13.2.  Hemoglobin stable 10.6.  Hemoglobin A1c 11.3.  Scheduled Meds: . amLODipine  5 mg Oral Daily  . bisoprolol  10 mg Oral Daily  . divalproex  2,000 mg Oral Daily  . heparin injection (subcutaneous)  5,000 Units Subcutaneous Q8H  . lamoTRIgine  100 mg Oral Daily  . nystatin  5 mL Oral  QID  . potassium chloride  40 mEq Oral Q4H  . sodium bicarbonate  1,300 mg Oral TID  . sodium chloride flush  5 mL Intracatheter Q8H   Continuous Infusions: . sodium chloride  10 mL/hr at 12/26/17 1800  . sodium chloride 10 mL (12/28/17 0821)  . cefTRIAXone (ROCEPHIN)  IV 1 g (12/28/17 0823)  . magnesium sulfate 1 - 4 g bolus IVPB      Principal Problem:   Sepsis ([place]) Active Problems:   Chronic interstitial nephritis   Generalized weakness   Hypokalemia   Oral candidiasis   Acute cystitis with hematuria   CKD (chronic kidney disease), stage III ([facility])   Staghorn calculus   LOS: 4 days

## 2017-12-28 NOTE — Progress Notes (Signed)
Physical Therapy Treatment Patient Details Name: Pamela Carney MRN: [id number] DOB: 1965-06-16 Today's Date: 12/28/2017    History of Present Illness 52 yo admitted with sepsis due to complicated UTI with AKF, metabolic acidosis, 44/3 nephrostomy. PMHx: chronic migraine with NSAID use, HTN, AFib, bipolar    PT Comments    Continuing work on functional mobility and activity tolerance;  Limited by dizziness and nausea this session, likely due to being in bed majority of the day; Checked BPs which were sable sitting EOB and post walk; Will continue to follow to work on functional mobility and activity tolerance; Encouraged pt to eat meals out of bed and in the chair   Follow Up Recommendations  Supervision/Assistance - 24 hour;SNF     Equipment Recommendations  Rolling walker with 5" wheels    Recommendations for Other Services       Precautions / Restrictions Precautions Precautions: Fall    Mobility  Bed Mobility Overal bed mobility: Needs Assistance Bed Mobility: Supine to Sit     Supine to sit: Min assist     General bed mobility comments: Min handheld assist to pull to sit; slow moving, adn reported discomfort in groin  Transfers Overall transfer level: Needs assistance   Transfers: Sit to/from Stand Sit to Stand: Min guard         General transfer comment: cues for hand placement with increased time.   Ambulation/Gait Ambulation/Gait assistance: Min guard(second person for chair push) Gait Distance (Feet): 12 Feet Assistive device: Rolling walker (2 wheeled) Gait Pattern/deviations: Step-through pattern;Decreased stride length;Trunk flexed     General Gait Details: chair follow with pt fatiguing quickly with encouragement to maximize distance; Amb distance limited by nausea   Stairs             Wheelchair Mobility    Modified Rankin (Stroke Patients Only)       Balance     Sitting balance-Leahy Scale: Fair       Standing  balance-Leahy Scale: Poor                              Cognition Arousal/Alertness: Awake/alert Behavior During Therapy: Flat affect Overall Cognitive Status: Within Functional Limits for tasks assessed(for simple mobility tasks)                                 General Comments: Seems improved from last PT session      Exercises      General Comments        Pertinent Vitals/Pain Pain Assessment: Faces Faces Pain Scale: Hurts even more Pain Location: drain insertion; reports groin pain with bed mobiltiy (groin pain better with standing), and nausea Pain Descriptors / Indicators: Discomfort;Grimacing Pain Intervention(s): Monitored during session;Other (comment)(Notified RN of nausea)    Home Living                      Prior Function            PT Goals (current goals can now be found in the care plan section) Acute Rehab PT Goals Patient Stated Goal: Did not state, but agreeable to to try to walk PT Goal Formulation: With patient Time For Goal Achievement: 01/10/18 Potential to Achieve Goals: Fair Progress towards PT goals: Not progressing toward goals - comment(limited by nausea today)    Frequency    Min 3X/week  PT Plan Current plan remains appropriate    Co-evaluation              AM-PAC PT "6 Clicks" Daily Activity  Outcome Measure  Difficulty turning over in bed (including adjusting bedclothes, sheets and blankets)?: A Little Difficulty moving from lying on back to sitting on the side of the bed? : Unable Difficulty sitting down on and standing up from a chair with arms (e.g., wheelchair, bedside commode, etc,.)?: A Little Help needed moving to and from a bed to chair (including a wheelchair)?: A Little Help needed walking in hospital room?: A Little Help needed climbing 3-5 steps with a railing? : A Lot 6 Click Score: 15    End of Session Equipment Utilized During Treatment: Gait belt Activity  Tolerance: Patient limited by fatigue;Other (comment)(and nausea) Patient left: in chair;with call bell/phone within reach Nurse Communication: Mobility status PT Visit Diagnosis: Other abnormalities of gait and mobility (R26.89);Muscle weakness (generalized) (M62.81)     Time: 4158-3094 PT Time Calculation (min) (ACUTE ONLY): 14 min  Charges:  $Gait Training: 8-22 mins                     Roney Marion, [place] Pager [phone] Office [place] 12/28/2017, 1:48 PM

## 2017-12-29 LAB — CBC
HEMATOCRIT: 33 % — AB (ref 36.0–46.0)
HEMOGLOBIN: 10.6 g/dL — AB (ref 12.0–15.0)
MCH: 30.2 pg (ref 26.0–34.0)
MCHC: 32.1 g/dL (ref 30.0–36.0)
MCV: 94 fL (ref 80.0–100.0)
NRBC: 0.4 % — AB (ref 0.0–0.2)
Platelets: 315 10*3/uL (ref 150–400)
RBC: 3.51 MIL/uL — ABNORMAL LOW (ref 3.87–5.11)
RDW: 15.7 % — ABNORMAL HIGH (ref 11.5–15.5)
WBC: 12.6 10*3/uL — AB (ref 4.0–10.5)

## 2017-12-29 LAB — CULTURE, BLOOD (ROUTINE X 2)
CULTURE: NO GROWTH
Culture: NO GROWTH

## 2017-12-29 LAB — BASIC METABOLIC PANEL
Anion gap: 7 (ref 5–15)
BUN: 14 mg/dL (ref 6–20)
CHLORIDE: 104 mmol/L (ref 98–111)
CO2: 29 mmol/L (ref 22–32)
Calcium: 8.8 mg/dL — ABNORMAL LOW (ref 8.9–10.3)
Creatinine, Ser: 0.79 mg/dL (ref 0.44–1.00)
GFR calc non Af Amer: >60 mL/min (ref >60)
Glucose, Bld: 129 mg/dL — ABNORMAL HIGH (ref 70–99)
POTASSIUM: 3.1 mmol/L — AB (ref 3.5–5.1)
SODIUM: 140 mmol/L (ref 135–145)

## 2017-12-29 LAB — MAGNESIUM: Magnesium: 1.8 mg/dL (ref 1.7–2.4)

## 2017-12-29 MED ORDER — POLYETHYLENE GLYCOL 3350 17 G PO PACK
17.0000 g | PACK | Freq: Every day | ORAL | Status: DC
  Administered 2017-12-29: 17 g via ORAL
  Filled 2017-12-29: qty 1

## 2017-12-29 MED ORDER — LAMOTRIGINE 100 MG PO TABS: 100.0000 mg | ORAL_TABLET | Freq: Every day | ORAL | 0 refills | Status: DC

## 2017-12-29 MED ORDER — CEPHALEXIN 500 MG PO CAPS: 500.0000 mg | ORAL_CAPSULE | Freq: Two times a day (BID) | ORAL | 0 refills | Status: AC

## 2017-12-29 MED ORDER — CEPHALEXIN 500 MG PO CAPS
500.0000 mg | ORAL_CAPSULE | Freq: Two times a day (BID) | ORAL | Status: DC
  Administered 2017-12-29: 500 mg via ORAL
  Filled 2017-12-29: qty 1

## 2017-12-29 MED ORDER — DIVALPROEX SODIUM ER 500 MG PO TB24: 1000.0000 mg | ORAL_TABLET | Freq: Every day | ORAL | 0 refills | Status: DC

## 2017-12-29 MED ORDER — DIVALPROEX SODIUM ER 500 MG PO TB24: 2000.0000 mg | ORAL_TABLET | Freq: Every day | ORAL | 0 refills | Status: DC

## 2017-12-29 MED ORDER — POTASSIUM CHLORIDE CRYS ER 20 MEQ PO TBCR
40.0000 meq | EXTENDED_RELEASE_TABLET | Freq: Once | ORAL | Status: AC
  Administered 2017-12-29: 40 meq via ORAL
  Filled 2017-12-29: qty 2

## 2017-12-29 MED ORDER — POLYETHYLENE GLYCOL 3350 17 G PO PACK: 17.0000 g | PACK | Freq: Every day | ORAL | 0 refills | Status: DC

## 2017-12-29 MED ORDER — SODIUM BICARBONATE 650 MG PO TABS: 650.0000 mg | ORAL_TABLET | Freq: Two times a day (BID) | ORAL | 0 refills | Status: DC

## 2017-12-29 MED ORDER — NYSTATIN 100000 UNIT/ML MT SUSP: 5.0000 mL | Freq: Four times a day (QID) | OROMUCOSAL | 0 refills | Status: DC

## 2017-12-29 MED ORDER — AMLODIPINE BESYLATE 5 MG PO TABS: 5.0000 mg | ORAL_TABLET | Freq: Every day | ORAL | 0 refills | Status: DC

## 2017-12-29 MED ORDER — BISOPROLOL FUMARATE 10 MG PO TABS: 10.0000 mg | ORAL_TABLET | Freq: Every day | ORAL | 0 refills | Status: DC

## 2017-12-29 NOTE — Discharge Summary (Signed)
Physician Discharge Summary  Pamela Carney TFT:[id number] DOB: 10/06/1965 DOA: 12/23/2017  PCP: Tamsen Roers, MD  Admit date: 12/23/2017 Discharge date: 12/29/2017  Admitted From: Home  Disposition:  Home   Recommendations for Outpatient Follow-up:  1. Follow up with PCP in 1-2 weeks 2. Please obtain BMP/CBC in one week 3. Follow up with Dr Tresa Moore for further care of kidney stone.  4. Follow up with nephrology   Home Health: yes  Discharge Condition: stable.  CODE STATUS: full code.  Diet recommendation: Heart Healthy  Brief/Interim Summary: Brief Narrative: 52 year old woman PMH migraine headaches with excessive and daily ibuprofen use presented with dysuria onset 2 months prior, intermittent hematuria, low back pain, generalized weakness.  Found to have acute kidney injury, anion gap metabolic acidosis, bilateral nephrolithiasis, cystitis.  She became hypotensive and was transferred to the ICU.  Fortunately she responded to conservative management with IV fluids and antibiotics, she underwent percutaneous placement of a nephrostomy tube managed by IR.  Plan is for outpatient follow-up with urology for definitive treatment of stone.  Acute kidney injury resolved and she will follow-up with nephrology as an outpatient.  She continues to be treated for complicated UTI with hematuria.  She has been evaluated by physical therapy, she may need skilled nurse facility or may be on the go home in the next 48 hours.  Assessment/Plan Sepsis secondary to complicated UTI E. coli, with hematuria. --Sensitive to cephalosporins. --discussed with Dr Tresa Moore will treat for total of 14 days.  Discharge on keflex.   Acute kidney injury on CKD stage III with severe anion gap metabolic acidosis on admission, complicated by excessive daily ibuprofen use, hydrochlorothiazide, losartan.   --Resolved, secondary to acute and chronic NSAID use.  Nephrology has signed off but has arranged outpatient follow-up  with Dr. Joelyn Oms.  Complicated staghorn calculus, nonobstructing, in the left --Status post percutaneous nephrostomy 10/5 --We will need outpatient follow-up with urology for definitive stone treatment  Migraine headaches.  Absolutely no NSAIDs on discharge.  Generalized weakness secondary to metabolic derangement as above --Continue PT may need skilled nursing facility versus home with home health.  Anxiety, bipolar 1 disorder --Stable.  Resume Depakote and Lamictal.  Essential hypertension --Hold hydrochlorothiazide, losartan.  Can resume bisoprolol 10 mg daily.  Possible oblique IUD, this can be followed up in the outpatient setting  EARLY SKIN breakdown sacrum, present on admission --wound care consulted   Hypokalemia; replete k   Discharge Diagnoses:  Principal Problem:   Sepsis ([place]) Active Problems:   Chronic interstitial nephritis   Generalized weakness   Hypokalemia   Oral candidiasis   Acute cystitis with hematuria   CKD (chronic kidney disease), stage III ([place])   Staghorn calculus    Discharge Instructions  Discharge Instructions    Diet - low sodium heart healthy   Complete by:  As directed    Increase activity slowly   Complete by:  As directed      Allergies as of 12/29/2017      Reactions   Sulfa Antibiotics Hives, Swelling   Swelling site not recalled   Latex Rash   Tape Rash   Not tolerated well      Medication List    STOP taking these medications   butalbital-acetaminophen-caffeine 50-325-40-30 MG capsule Commonly known as:  FIORICET WITH CODEINE   ibuprofen 200 MG tablet Commonly known as:  ADVIL,MOTRIN   losartan 25 MG tablet Commonly known as:  COZAAR   XANAX 0.5 MG tablet Generic drug:  ALPRAZolam   ZIAC 10-6.25 MG tablet Generic drug:  bisoprolol-hydrochlorothiazide     TAKE these medications   amLODipine 5 MG tablet Commonly known as:  NORVASC Take 1 tablet (5 mg total) by mouth daily. Start taking on:   12/30/2017   bisoprolol 10 MG tablet Commonly known as:  ZEBETA Take 1 tablet (10 mg total) by mouth daily. Start taking on:  12/30/2017   cephALEXin 500 MG capsule Commonly known as:  KEFLEX Take 1 capsule (500 mg total) by mouth every 12 (twelve) hours for 7 days.   divalproex 500 MG 24 hr tablet Commonly known as:  DEPAKOTE ER Take 4 tablets (2,000 mg total) by mouth daily.   lamoTRIgine 100 MG tablet Commonly known as:  LAMICTAL Take 1 tablet (100 mg total) by mouth daily.   nystatin 100000 UNIT/ML suspension Commonly known as:  MYCOSTATIN Take 5 mLs (500,000 Units total) by mouth 4 (four) times daily.   ONE-A-DAY WOMENS 50+ ADVANTAGE Tabs Take 1 tablet by mouth daily.   polyethylene glycol packet Commonly known as:  MIRALAX / GLYCOLAX Take 17 g by mouth daily. Start taking on:  12/30/2017   sodium bicarbonate 650 MG tablet Take 1 tablet (650 mg total) by mouth 2 (two) times daily.            Durable Medical Equipment  (From admission, onward)         Start     Ordered   12/29/17 1452  For home use only DME Tub bench  Once     12/29/17 1451   12/29/17 1451  For home use only DME 3 n 1  Once     12/29/17 1451         Follow-up Information    Rexene Agent, MD Follow up.   Specialty:  Nephrology Why:  We will call with appointment Contact information: [place] [id number] [phone]        [facility], [place] Follow up.   Specialty:  Home Health Services Why:  Registered Nurse, Occupational Therapy Contact information: [address] [place] [address] [place] Follow up.   Why:  3n1, Tub bench, [place] Contact information: [place] [address] [phone]          Allergies  Allergen Reactions  . Sulfa Antibiotics Hives and Swelling    Swelling site not recalled  . Latex Rash  . Tape Rash    Not tolerated well     Consultations: Nephrology Urology   Procedures/Studies: Dg Chest 2 View  Result Date: 12/23/2017 CLINICAL DATA:  Dysuria, weakness EXAM: CHEST - 2 VIEW COMPARISON:  None. FINDINGS: Lungs are clear.  No pleural effusion or pneumothorax. The heart is normal in size. Visualized osseous structures are within normal limits. IMPRESSION: Normal chest radiographs. Electronically Signed   By: Julian Hy M.D.   On: 12/23/2017 22:06   Ct Renal Stone Study  Result Date: 12/23/2017 CLINICAL DATA:  Dysuria, weakness EXAM: CT ABDOMEN AND PELVIS WITHOUT CONTRAST TECHNIQUE: Multidetector CT imaging of the abdomen and pelvis was performed following the standard protocol without IV contrast. COMPARISON:  12/18/2013 FINDINGS: Lower chest: Lung bases are clear. Hepatobiliary: Mild hepatic steatosis. Layering tiny gallstones (series 3/image 29), without associated inflammatory changes. No intrahepatic or extrahepatic ductal dilatation. Pancreas: Within normal limits. Spleen: Within normal limits. Adrenals/Urinary Tract: 2.9 cm right adrenal adenoma (series 3/image 19),  previously 2.2 cm. Left adrenal gland is within normal limits. Right kidney is notable for a 5 mm nonobstructing right lower pole renal calculus (series 3/image 41). Vascular calcifications. No hydronephrosis. 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 Layering calcifications in a left lower pole calyceal diverticulum (series 3/image 39). No hydronephrosis. Bladder is notable for nondependent gas. Stomach/Bowel: Stomach is within normal limits. No evidence of bowel obstruction. Normal appendix (series 3/image 58). Sigmoid diverticulosis, without evidence of diverticulitis. Vascular/Lymphatic: No evidence of abdominal aortic aneurysm. Prominent para-aortic nodes measuring up to 10 mm short axis (series 3/image 34), at the upper  limits of normal. Reproductive: Uterine IUD, mildly obliqued, with the right arm possibly embedded in the myometrium (series 3/image 69). Bilateral ovaries are unremarkable. Other: No abdominopelvic ascites. Tiny fat containing periumbilical hernia (series 3/image 62). Musculoskeletal: Mild degenerative changes of the visualized thoracolumbar spine. 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 No hydronephrosis. Nondependent gas in the bladder, correlate for cystitis. No evidence of bowel obstruction. Normal appendix. Sigmoid diverticulosis, without evidence of diverticulitis. Cholelithiasis, without associated inflammatory changes. Uterine IUD may be mildly obliqued, with its right arm possibly embedded in the myometrium. Electronically Signed   By: Julian Hy M.D.   On: 12/23/2017 23:57   Ir Nephrostomy Placement Left  Result Date: 12/25/2017 CLINICAL DATA:  Left nephrolithiasis EXAM: LEFT PERCUTANEOUS NEPHROSTOMY CATHETER PLACEMENT UNDER ULTRASOUND AND FLUOROSCOPIC GUIDANCE FLUOROSCOPY TIME:  2.6 minute; 327 uGym2 DAP TECHNIQUE: The procedure, risks (including but not limited to bleeding, infection, organ damage ), Benefits, and alternatives were explained to the patient. Questions regarding the procedure were encouraged and answered. The patient understands and consents to the procedure. Survey ultrasound of the left flank was performed, the kidney localized, and an appropriate skin entry site determined. Leftflank region prepped with Betadine, draped in usual sterile fashion, infiltrated locally with 1% lidocaine. Intravenous Fentanyl and Versed were administered as conscious sedation during continuous monitoring of the patient's level of consciousness and physiological / cardiorespiratory status by the radiology RN, with a total moderate sedation time of 17 minutes. Under real-time ultrasound guidance, a  21-gauge trocar needle was advanced into a posterior lower pole calyx. Ultrasound image documentation was saved. Cloudy urine spontaneously returned through the needle. Needle was exchanged over a guidewire for transitional dilator. Contrast injection confirmed appropriate positioning. A small central renal collecting system was identified. Large lower pole calyceal diverticulum corresponding to CT findings, containing calculi. Proximal left ureter patent and nondilated. Catheter was exchanged over a guidewire for a 10 French Dawson Mueller pigtail catheter, formed centrally within the upper pole moiety of the renal collecting system. Contrast injection confirms appropriate positioning and patency. Catheter secured externally with 0 Prolene suture and StatLock, and placed to external drain bag. Urine sample sent for Gram stain and culture. COMPLICATIONS: COMPLICATIONS none IMPRESSION: 1. Technically successful left percutaneous nephrostomy catheter placement. 2. Left nephrolithiasis with attenuated central renal collecting system. Electronically Signed   By: Lucrezia Europe M.D.   On: 12/25/2017 10:30     Subjective: Feeling well, no new complaints   Discharge Exam: Vitals:   12/29/17 0518 12/29/17 0808  BP: 126/73 117/81  Pulse: 62 65  Resp: 18 18  Temp: 98.4 F (36.9 C) 98.2 F (36.8 C)  SpO2: 92% 92%   Vitals:   12/28/17 1702 12/28/17  2033 12/29/17 0518 12/29/17 0808  BP: 120/81 107/65 126/73 117/81  Pulse: 79 66 62 65  Resp: 18 19 18 18   Temp: 98.1 F (36.7 C) 98.4 F (36.9 C) 98.4 F (36.9 C) 98.2 F (36.8 C)  TempSrc: Oral Oral Oral Oral  SpO2: 92% 93% 92% 92%  Weight:      Height:        General: Pt is alert, awake, not in acute distress Cardiovascular: RRR, S1/S2 +, no rubs, no gallops Respiratory: CTA bilaterally, no wheezing, no rhonchi Abdominal: Soft, NT, ND, bowel sounds + Extremities: no edema, no cyanosis    The results of significant diagnostics from this  hospitalization (including imaging, microbiology, ancillary and laboratory) are listed below for reference.     Microbiology: Recent Results (from the past 240 hour(s))  Urine culture     Status: Abnormal   Collection Time: 12/23/17  9:28 PM  Result Value Ref Range Status   Specimen Description URINE, RANDOM  Final   Special Requests   Final    NONE Performed at [facility], [place] [address], [place], [place] [address]    Culture >=100,000 COLONIES/mL ESCHERICHIA COLI (A)  Final   Report Status 12/26/2017 FINAL  Final   Organism ID, Bacteria ESCHERICHIA COLI (A)  Final      Susceptibility   Escherichia coli - MIC*    AMPICILLIN >=32 RESISTANT Resistant     CEFAZOLIN <=4 SENSITIVE Sensitive     CEFTRIAXONE <=1 SENSITIVE Sensitive     CIPROFLOXACIN >=4 RESISTANT Resistant     GENTAMICIN <=1 SENSITIVE Sensitive     IMIPENEM <=0.25 SENSITIVE Sensitive     NITROFURANTOIN <=16 SENSITIVE Sensitive     TRIMETH/SULFA <=20 SENSITIVE Sensitive     AMPICILLIN/SULBACTAM 16 INTERMEDIATE Intermediate     PIP/TAZO <=4 SENSITIVE Sensitive     Extended ESBL NEGATIVE Sensitive     * >=100,000 COLONIES/mL ESCHERICHIA COLI  Culture, blood (Routine x 2)     Status: None   Collection Time: 12/23/17  9:46 PM  Result Value Ref Range Status   Specimen Description BLOOD BLOOD RIGHT FOREARM  Final   Special Requests   Final    BOTTLES DRAWN AEROBIC AND ANAEROBIC Blood Culture results may not be optimal due to an inadequate volume of blood received in culture bottles   Culture   Final    NO GROWTH 5 DAYS Performed at [place] [address] [place], [place] [address]    Report Status 12/28/2017 FINAL  Final  Culture, blood (Routine x 2)     Status: None   Collection Time: 12/23/17 10:55 PM  Result Value Ref Range Status   Specimen Description BLOOD RIGHT HAND  Final   Special Requests   Final    BOTTLES DRAWN AEROBIC ONLY Blood Culture results may not be optimal due to an inadequate  volume of blood received in culture bottles   Culture   Final    NO GROWTH 5 DAYS Performed at [facility], [place] [address] [place], [place] [address]    Report Status 12/29/2017 FINAL  Final  Blood culture (routine x 2)     Status: None   Collection Time: 12/24/17  5:00 AM  Result Value Ref Range Status   Specimen Description BLOOD LEFT ARM  Final   Special Requests   Final    BOTTLES DRAWN AEROBIC ONLY Blood Culture results may not be optimal due to an inadequate volume of blood  received in culture bottles   Culture   Final    NO GROWTH 5 DAYS Performed at [facility], [place] [address], [place], [place] [address]    Report Status 12/29/2017 FINAL  Final  MRSA PCR Screening     Status: None   Collection Time: 12/24/17  3:10 PM  Result Value Ref Range Status   MRSA by PCR NEGATIVE NEGATIVE Final    Comment:        The GeneXpert MRSA Assay (FDA approved for NASAL specimens only), is one component of a comprehensive MRSA colonization surveillance program. It is not intended to diagnose MRSA infection nor to guide or monitor treatment for MRSA infections. Performed at [facility], [place] [address], [place], [place] [address]   Urine Culture     Status: Abnormal   Collection Time: 12/25/17  9:44 AM  Result Value Ref Range Status   Specimen Description KIDNEY LEFT NEPHROSTOMY  Final   Special Requests   Final    NONE Performed at [facility], [place] [address], [place], [place] [address]    Culture >=100,000 COLONIES/mL ESCHERICHIA COLI (A)  Final   Report Status 12/28/2017 FINAL  Final   Organism ID, Bacteria ESCHERICHIA COLI (A)  Final      Susceptibility   Escherichia coli - MIC*    AMPICILLIN >=32 RESISTANT Resistant     CEFAZOLIN <=4 SENSITIVE Sensitive     CEFEPIME <=1 SENSITIVE Sensitive     CEFTAZIDIME <=1 SENSITIVE Sensitive     CEFTRIAXONE <=1 SENSITIVE Sensitive     CIPROFLOXACIN >=4 RESISTANT Resistant     GENTAMICIN <=1 SENSITIVE  Sensitive     IMIPENEM <=0.25 SENSITIVE Sensitive     TRIMETH/SULFA <=20 SENSITIVE Sensitive     AMPICILLIN/SULBACTAM 16 INTERMEDIATE Intermediate     PIP/TAZO <=4 SENSITIVE Sensitive     Extended ESBL NEGATIVE Sensitive     * >=100,000 COLONIES/mL ESCHERICHIA COLI     Labs: BNP (last 3 results) No results for input(s): BNP in the last 8760 hours. Basic Metabolic Panel: Recent Labs  Lab 12/24/17 1324 12/25/17 0322  12/26/17 0538 12/26/17 1509 12/27/17 0440 12/28/17 0533 12/29/17 0809  NA 140 141   < > 141 140 143 141 140  K 2.7* 2.4*   < > 2.8* 2.2* 3.0* 3.0* 3.1*  CL 112* 111   < > 114* 111 113* 109 104  CO2 9* 15*   < > 19* 22 21* 25 29  GLUCOSE 61* 170*   < > 111* 147* 120* 116* 129*  BUN 70* 57*   < > 40* 32* 29* 18 14  CREATININE 3.30* 2.35*   < > 1.35* 1.14* 1.11* 0.84 0.79  CALCIUM 6.0* 6.3*   < > 7.4* 8.1* 8.6* 8.9 8.8*  MG 2.0 1.9  --  2.4  --   --  1.6* 1.8  PHOS  --   --   --  2.1*  --   --   --   --    < > = values in this interval not displayed.   Liver Function Tests: Recent Labs  Lab 12/23/17 2123  AST 19  ALT 17  ALKPHOS 89  BILITOT 1.5*  PROT 6.6  ALBUMIN 3.1*   No results for input(s): LIPASE, AMYLASE in the last 168 hours. No results for input(s): AMMONIA in the last 168 hours. CBC: Recent Labs  Lab 12/23/17 2123  12/25/17 0322 12/26/17 0538 12/27/17 0440 12/28/17 0533 12/29/17  0809  WBC 20.2*   < > 9.5 11.7* 10.9* 13.2* 12.6*  NEUTROABS 17.3*  --   --   --   --   --   --   HGB 15.2*   < > 10.6* 10.1* 10.0* 10.6* 10.6*  HCT 45.9   < > 30.5* 28.7* 29.4* 32.4* 33.0*  MCV 94.1   < > 88.9 89.7 91.6 92.8 94.0  PLT 381   < > 315 300 306 317 315   < > = values in this interval not displayed.   Cardiac Enzymes: No results for input(s): CKTOTAL, CKMB, CKMBINDEX, TROPONINI in the last 168 hours. BNP: Invalid input(s): POCBNP CBG: Recent Labs  Lab 12/26/17 0820 12/26/17 1208 12/26/17 1744 12/26/17 2233 12/27/17 0835  GLUCAP 131* 168*  117* 148* 114*   D-Dimer No results for input(s): DDIMER in the last 72 hours. Hgb A1c Recent Labs    12/28/17 0533  HGBA1C 11.3*   Lipid Profile No results for input(s): CHOL, HDL, LDLCALC, TRIG, CHOLHDL, LDLDIRECT in the last 72 hours. Thyroid function studies No results for input(s): TSH, T4TOTAL, T3FREE, THYROIDAB in the last 72 hours.  Invalid input(s): FREET3 Anemia work up No results for input(s): VITAMINB12, FOLATE, FERRITIN, TIBC, IRON, RETICCTPCT in the last 72 hours. Urinalysis    Component Value Date/Time   COLORURINE YELLOW 12/23/2017 2145   APPEARANCEUR TURBID (A) 12/23/2017 2145   LABSPEC 1.014 12/23/2017 2145   PHURINE 5.0 12/23/2017 2145   GLUCOSEU 50 (A) 12/23/2017 2145   HGBUR LARGE (A) 12/23/2017 2145   BILIRUBINUR NEGATIVE 12/23/2017 2145   [place] NEGATIVE 12/23/2017 2145   PROTEINUR 100 (A) 12/23/2017 2145   UROBILINOGEN 0.2 03/31/2010 0246   NITRITE NEGATIVE 12/23/2017 2145   LEUKOCYTESUR LARGE (A) 12/23/2017 2145   Sepsis Labs Invalid input(s): PROCALCITONIN,  WBC,  LACTICIDVEN Microbiology Recent Results (from the past 240 hour(s))  Urine culture     Status: Abnormal   Collection Time: 12/23/17  9:28 PM  Result Value Ref Range Status   Specimen Description URINE, RANDOM  Final   Special Requests   Final    NONE Performed at [facility], [place] [address], [place], [place] [address]    Culture >=100,000 COLONIES/mL ESCHERICHIA COLI (A)  Final   Report Status 12/26/2017 FINAL  Final   Organism ID, Bacteria ESCHERICHIA COLI (A)  Final      Susceptibility   Escherichia coli - MIC*    AMPICILLIN >=32 RESISTANT Resistant     CEFAZOLIN <=4 SENSITIVE Sensitive     CEFTRIAXONE <=1 SENSITIVE Sensitive     CIPROFLOXACIN >=4 RESISTANT Resistant     GENTAMICIN <=1 SENSITIVE Sensitive     IMIPENEM <=0.25 SENSITIVE Sensitive     NITROFURANTOIN <=16 SENSITIVE Sensitive     TRIMETH/SULFA <=20 SENSITIVE Sensitive     AMPICILLIN/SULBACTAM 16  INTERMEDIATE Intermediate     PIP/TAZO <=4 SENSITIVE Sensitive     Extended ESBL NEGATIVE Sensitive     * >=100,000 COLONIES/mL ESCHERICHIA COLI  Culture, blood (Routine x 2)     Status: None   Collection Time: 12/23/17  9:46 PM  Result Value Ref Range Status   Specimen Description BLOOD BLOOD RIGHT FOREARM  Final   Special Requests   Final    BOTTLES DRAWN AEROBIC AND ANAEROBIC Blood Culture results may not be optimal due to an inadequate volume of blood received in culture bottles   Culture   Final    NO GROWTH 5 DAYS Performed at [facility], [place] [address], [place], [place] [address]    Report Status 12/28/2017 FINAL  Final  Culture, blood (Routine x 2)     Status: None   Collection Time: 12/23/17 10:55 PM  Result Value Ref Range Status   Specimen Description BLOOD RIGHT HAND  Final   Special Requests   Final    BOTTLES DRAWN AEROBIC ONLY Blood Culture results may not be optimal due to an inadequate volume of blood received in culture bottles   Culture   Final    NO GROWTH 5 DAYS Performed at [facility], [place] [address] [place], [place] [address]    Report Status 12/29/2017 FINAL  Final  Blood culture (routine x 2)     Status: None   Collection Time: 12/24/17  5:00 AM  Result Value Ref Range Status   Specimen Description BLOOD LEFT ARM  Final   Special Requests   Final    BOTTLES DRAWN AEROBIC ONLY Blood Culture results may not be optimal due to an inadequate volume of blood received in culture bottles   Culture   Final    NO GROWTH 5 DAYS Performed at [facility], [place] [address], [place], [place] [address]    Report Status 12/29/2017 FINAL  Final  MRSA PCR Screening     Status: None   Collection Time: 12/24/17  3:10 PM  Result Value Ref Range Status   MRSA by PCR NEGATIVE NEGATIVE Final    Comment:        The GeneXpert MRSA Assay (FDA approved for NASAL specimens only), is one component of a comprehensive MRSA colonization surveillance program.  It is not intended to diagnose MRSA infection nor to guide or monitor treatment for MRSA infections. Performed at [facility], [place] [address], [place], [place] [address]   Urine Culture     Status: Abnormal   Collection Time: 12/25/17  9:44 AM  Result Value Ref Range Status   Specimen Description KIDNEY LEFT NEPHROSTOMY  Final   Special Requests   Final    NONE Performed at [facility], [place] [address] [place], [place] [address]    Culture >=100,000 COLONIES/mL ESCHERICHIA COLI (A)  Final   Report Status 12/28/2017 FINAL  Final   Organism ID, Bacteria ESCHERICHIA COLI (A)  Final      Susceptibility   Escherichia coli - MIC*    AMPICILLIN >=32 RESISTANT Resistant     CEFAZOLIN <=4 SENSITIVE Sensitive     CEFEPIME <=1 SENSITIVE Sensitive     CEFTAZIDIME <=1 SENSITIVE Sensitive     CEFTRIAXONE <=1 SENSITIVE Sensitive     CIPROFLOXACIN >=4 RESISTANT Resistant     GENTAMICIN <=1 SENSITIVE Sensitive     IMIPENEM <=0.25 SENSITIVE Sensitive     TRIMETH/SULFA <=20 SENSITIVE Sensitive     AMPICILLIN/SULBACTAM 16 INTERMEDIATE Intermediate     PIP/TAZO <=4 SENSITIVE Sensitive     Extended ESBL NEGATIVE Sensitive     * >=100,000 COLONIES/mL ESCHERICHIA COLI     Time coordinating discharge: 35 minutes   SIGNED:   Elmarie Shiley, MD  [facility] 12/29/2017, 4:10 PM Pager [phone]  If 7PM-7AM, please contact night-coverage [email or web address] Password [email or web address]

## 2017-12-29 NOTE — Progress Notes (Signed)
Patient Discharge: Disposition: Patient discharged to home. Education: Reviewed medications, prescriptions, follow-up appointments and discharge instructions, verbalized understanding. IV: Discontinued IV before discharge. Telemetry: Discontinued Tele before discharge. Transportation: Patient escorted out of the unit in w/c accompanied by the husband. Belongings: Patient took all her belongings with her.

## 2017-12-29 NOTE — Clinical Social Work Note (Signed)
CSW received consult for nursing home placement. CSW reviewed nurse case managers note completed today and also talked with nurse case manager and patient will discharge home with [facility] services. CSW signing off as no SW intervention services needed as d/c plan is home.  Dorene Bruni Givens, MSW, LCSW Licensed Clinical Social Worker [place] [phone]

## 2017-12-29 NOTE — Progress Notes (Signed)
Inpatient Diabetes Program Recommendations  AACE/ADA: New Consensus Statement on Inpatient Glycemic Control (2015)  Target Ranges:  Prepandial:   less than 140 mg/dL      Peak postprandial:   less than 180 mg/dL (1-2 hours)      Critically ill patients:  140 - 180 mg/dL   Lab Results  Component Value Date   GLUCAP 114 (H) 12/27/2017   HGBA1C 11.3 (H) 12/28/2017    Review of Glycemic Control Results for Pamela Carney, Pamela Carney (MRN [id number]) as of 12/29/2017 11:00  Ref. Range 12/27/2017 04:40 12/28/2017 05:33 12/29/2017 08:09  Glucose Latest Ref Range: 70 - 99 mg/dL 120 (H) 116 (H) 129 (H)   Diabetes history: none Outpatient Diabetes medications: none Current orders for Inpatient glycemic control: none  Inpatient Diabetes Program Recommendations:    Noted HgBA1C of 11.3% on 12/28/17. This does not correlate with current blood sugars even in the setting of Solucortef during inpatient admission, as an average blood glucose with an 11.3% A1C typically averages 260 mg/dL. Hemoglobin has been stable, indicating accuracy.  Could consider repeating A1C or ordering a Fructosamine for comparison. Would definitely suggest patient follow up with PCP regarding A1C. No additional recommendations at this time.   Thanks, Bronson Curb, MSN, RNC-OB Diabetes Coordinator [phone] (8a-5p)

## 2018-01-07 ENCOUNTER — Other Ambulatory Visit: Payer: Self-pay | Admitting: Urology

## 2018-01-11 ENCOUNTER — Ambulatory Visit: Payer: 59 | Admitting: Family

## 2018-01-25 NOTE — Progress Notes (Deleted)
Subjective:    Patient ID: Pamela Carney, female    DOB: [birth year], 52 y.o.   MRN: [id number]  HPI:  Pamela Carney is here to establish as a new pt.  She is a pleasant 52 year old female. PMH: CKD,   Patient Care Team    Relationship Specialty Notifications Start End  Tamsen Roers, MD PCP - General Family Medicine  10/05/13     Patient Active Problem List   Diagnosis Date Noted  . CKD (chronic kidney disease), stage III ([place]) 12/28/2017  . 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 Acute cystitis with hematuria   . Chronic interstitial nephritis 12/24/2017  . Generalized weakness 12/24/2017  . Sepsis ([place]) 12/24/2017  . Hypokalemia 12/24/2017  . Oral candidiasis 12/24/2017  . Neuropathy involving both lower extremities 01/31/2014  . Perimenopausal 12/29/2012  . Family planning, IUD (intrauterine device) check/reinsertion/removal 12/28/2012  . Pain 12/27/2012     Past Medical History:  Diagnosis Date  . Abnormal Pap smear   . Anxiety   . Atrial fibrillation ([place])   . Bipolar 1 disorder ([place])   . Genital warts    Hx of genital  . Hypertension      Past Surgical History:  Procedure Laterality Date  . CERVICAL CONE BIOPSY    . CESAREAN SECTION    . DILATION AND CURETTAGE OF UTERUS    . IR NEPHROSTOMY PLACEMENT LEFT  12/25/2017  . OVARIAN CYST REMOVAL    . UNILATERAL SALPINGECTOMY     Pt unsure of which fallopian tube was removed     Family History  Problem Relation Age of Onset  . Diabetes Paternal Grandfather   . COPD Paternal Grandfather   . Heart disease Paternal Grandfather   . Cancer Paternal Grandmother   . Heart disease Paternal Grandmother   . Cancer Father   . Hypertension Father   . Heart attack Father   . Heart failure Mother   . Diabetes Mother   . Heart disease Mother   . Hyperlipidemia Mother   . Hypertension Mother   . Heart attack Mother   . Peripheral vascular disease Mother   . Hypertension Sister   . Heart attack Sister      Social  History   Substance and Sexual Activity  Drug Use No     Social History   Substance and Sexual Activity  Alcohol Use No  . Alcohol/week: 0.0 standard drinks     Social History   Tobacco Use  Smoking Status Current Every Day Smoker  . Packs/day: 1.00  . Years: 32.00  . Pack years: 32.00  . Types: Cigarettes  Smokeless Tobacco Never Used     Outpatient Encounter Medications as of 01/27/2018  Medication Sig  . amLODipine (NORVASC) 5 MG tablet Take 1 tablet (5 mg total) by mouth daily.  . bisoprolol (ZEBETA) 10 MG tablet Take 1 tablet (10 mg total) by mouth daily.  . divalproex (DEPAKOTE ER) 500 MG 24 hr tablet Take 4 tablets (2,000 mg total) by mouth daily.  Marland Kitchen lamoTRIgine (LAMICTAL) 100 MG tablet Take 1 tablet (100 mg total) by mouth daily.  . Multiple Vitamins-Minerals (ONE-A-DAY WOMENS 50+ ADVANTAGE) TABS Take 1 tablet by mouth daily.  Marland Kitchen nystatin (MYCOSTATIN) 100000 UNIT/ML suspension Take 5 mLs (500,000 Units total) by mouth 4 (four) times daily.  . polyethylene glycol (MIRALAX / GLYCOLAX) packet Take 17 g by mouth daily.  . sodium bicarbonate 650 MG tablet Take 1 tablet (650 mg total) by mouth 2 (two)  times daily.   No facility-administered encounter medications on file as of 01/27/2018.     Allergies: Sulfa antibiotics; Latex; and Tape  There is no height or weight on file to calculate BMI.  There were no vitals taken for this visit.     Review of Systems     Objective:   Physical Exam        Assessment & Plan:  No diagnosis found.  No problem-specific Assessment & Plan notes found for this encounter.    FOLLOW-UP:  No follow-ups on file.

## 2018-01-27 ENCOUNTER — Ambulatory Visit: Payer: 59 | Admitting: Adult Health

## 2018-01-27 ENCOUNTER — Encounter (HOSPITAL_COMMUNITY): Payer: Self-pay

## 2018-01-27 NOTE — Patient Instructions (Addendum)
Your procedure is scheduled on: Friday, Nov. 15, 2019   Surgery Time:  11:30AM-2:30PM   Report to [facility]  Entrance    Report to admitting at 9:30AM   Call this number if you have problems the morning of surgery [phone]   Bring CPAP mask and tubing day of surgery   Do not eat food or drink liquids :After Midnight.   Brush your teeth the morning of surgery.   Do NOT smoke after Midnight   Take these medicines the morning of surgery with A SIP OF WATER: Amlodipine                              You may not have any metal on your body including hair pins, jewelry, and body piercings             Do not wear make-up, lotions, powders, perfumes/cologne, or deodorant             Do not wear nail polish.  Do not shave  48 hours prior to surgery.                Do not bring valuables to the hospital. [place].   Contacts, dentures or bridgework may not be worn into surgery.   Leave suitcase in the car. After surgery it may be brought to your room.    Special Instructions: Bring a copy of your healthcare power of attorney and living will documents         the day of surgery if you haven't scanned them in before.              Please read over the following fact sheets you were given:  [facility] - Preparing for Surgery Before surgery, you can play an important role.  Because skin is not sterile, your skin needs to be as free of germs as possible.  You can reduce the number of germs on your skin by washing with CHG (chlorahexidine gluconate) soap before surgery.  CHG is an antiseptic cleaner which kills germs and bonds with the skin to continue killing germs even after washing. Please DO NOT use if you have an allergy to CHG or antibacterial soaps.  If your skin becomes reddened/irritated stop using the CHG and inform your nurse when you arrive at Short Stay. Do not shave (including legs and underarms) for at  least 48 hours prior to the first CHG shower.  You may shave your face/neck.  Please follow these instructions carefully:  1.  Shower with CHG Soap the night before surgery and the  morning of surgery.  2.  If you choose to wash your hair, wash your hair first as usual with your normal  shampoo.  3.  After you shampoo, rinse your hair and body thoroughly to remove the shampoo.                             4.  Use CHG as you would any other liquid soap.  You can apply chg directly to the skin and wash.  Gently with a scrungie or clean washcloth.  5.  Apply the CHG Soap to your body ONLY FROM THE NECK DOWN.   Do   not use on face/ open  Wound or open sores. Avoid contact with eyes, ears mouth and   genitals (private parts).                       Wash face,  Genitals (private parts) with your normal soap.             6.  Wash thoroughly, paying special attention to the area where your    surgery  will be performed.  7.  Thoroughly rinse your body with warm water from the neck down.  8.  DO NOT shower/wash with your normal soap after using and rinsing off the CHG Soap.                9.  Pat yourself dry with a clean towel.            10.  Wear clean pajamas.            11.  Place clean sheets on your bed the night of your first shower and do not  sleep with pets. Day of Surgery : Do not apply any lotions/deodorants the morning of surgery.  Please wear clean clothes to the hospital/surgery center.  FAILURE TO FOLLOW THESE INSTRUCTIONS MAY RESULT IN THE CANCELLATION OF YOUR SURGERY  PATIENT SIGNATURE_________________________________  NURSE SIGNATURE__________________________________  ________________________________________________________________________   Adam Phenix  An incentive spirometer is a tool that can help keep your lungs clear and active. This tool measures how well you are filling your lungs with each breath. Taking long deep breaths may help  reverse or decrease the chance of developing breathing (pulmonary) problems (especially infection) following:  A long period of time when you are unable to move or be active. BEFORE THE PROCEDURE   If the spirometer includes an indicator to show your best effort, your nurse or respiratory therapist will set it to a desired goal.  If possible, sit up straight or lean slightly forward. Try not to slouch.  Hold the incentive spirometer in an upright position. INSTRUCTIONS FOR USE  1. Sit on the edge of your bed if possible, or sit up as far as you can in bed or on a chair. 2. Hold the incentive spirometer in an upright position. 3. Breathe out normally. 4. Place the mouthpiece in your mouth and seal your lips tightly around it. 5. Breathe in slowly and as deeply as possible, raising the piston or the ball toward the top of the column. 6. Hold your breath for 3-5 seconds or for as long as possible. Allow the piston or ball to fall to the bottom of the column. 7. Remove the mouthpiece from your mouth and breathe out normally. 8. Rest for a few seconds and repeat Steps 1 through 7 at least 10 times every 1-2 hours when you are awake. Take your time and take a few normal breaths between deep breaths. 9. The spirometer may include an indicator to show your best effort. Use the indicator as a goal to work toward during each repetition. 10. After each set of 10 deep breaths, practice coughing to be sure your lungs are clear. If you have an incision (the cut made at the time of surgery), support your incision when coughing by placing a pillow or rolled up towels firmly against it. Once you are able to get out of bed, walk around indoors and cough well. You may stop using the incentive spirometer when instructed by your caregiver.  RISKS AND COMPLICATIONS  Take your time  so you do not get dizzy or light-headed.  If you are in pain, you may need to take or ask for pain medication before doing incentive  spirometry. It is harder to take a deep breath if you are having pain. AFTER USE  Rest and breathe slowly and easily.  It can be helpful to keep track of a log of your progress. Your caregiver can provide you with a simple table to help with this. If you are using the spirometer at home, follow these instructions: [place] IF:   You are having difficultly using the spirometer.  You have trouble using the spirometer as often as instructed.  Your pain medication is not giving enough relief while using the spirometer.  You develop fever of 100.5 F (38.1 C) or higher. SEEK IMMEDIATE MEDICAL CARE IF:   You cough up bloody sputum that had not been present before.  You develop fever of 102 F (38.9 C) or greater.  You develop worsening pain at or near the incision site. MAKE SURE YOU:   Understand these instructions.  Will watch your condition.  Will get help right away if you are not doing well or get worse. Document Released: 07/20/2006 Document Revised: 06/01/2011 Document Reviewed: 09/20/2006 [facility] Patient Information 2014 [place], [place].   ________________________________________________________________________

## 2018-01-27 NOTE — Pre-Procedure Instructions (Signed)
The following are in [place] 12/30/2017 Hgb A1C (11.3) 12/28/2017 CXR 12/23/2017

## 2018-01-28 ENCOUNTER — Other Ambulatory Visit: Payer: Self-pay

## 2018-01-28 ENCOUNTER — Encounter (HOSPITAL_COMMUNITY)
Admission: RE | Admit: 2018-01-28 | Discharge: 2018-01-28 | Disposition: A | Discharge status: Home / Self Care | Payer: 59 | Source: Ambulatory Visit | Attending: Urology | Admitting: Urology

## 2018-01-28 ENCOUNTER — Encounter (HOSPITAL_COMMUNITY): Payer: Self-pay

## 2018-01-28 DIAGNOSIS — Z01812 Encounter for preprocedural laboratory examination: Secondary | ICD-10-CM | POA: Insufficient documentation

## 2018-01-28 HISTORY — DX: Chronic kidney disease, stage 3 unspecified: N18.30

## 2018-01-28 HISTORY — DX: Cardiac murmur, unspecified: R01.1

## 2018-01-28 HISTORY — DX: Other specified postprocedural states: Z98.890

## 2018-01-28 HISTORY — DX: Personal history of urinary calculi: Z87.442

## 2018-01-28 HISTORY — DX: Hereditary and idiopathic neuropathy, unspecified: G60.9

## 2018-01-28 HISTORY — DX: Urinary tract infection, site not specified: N39.0

## 2018-01-28 HISTORY — DX: Calculus of gallbladder without cholecystitis without obstruction: K80.20

## 2018-01-28 HISTORY — DX: Candidal stomatitis: B37.0

## 2018-01-28 HISTORY — DX: Weakness: R53.1

## 2018-01-28 HISTORY — DX: Unspecified cataract: H26.9

## 2018-01-28 HISTORY — DX: Personal history of other complications of pregnancy, childbirth and the puerperium: Z87.59

## 2018-01-28 HISTORY — DX: Personal history of other medical treatment: Z92.89

## 2018-01-28 HISTORY — DX: Low back pain, unspecified: M54.50

## 2018-01-28 HISTORY — DX: Non-pressure chronic ulcer of other part of unspecified foot with unspecified severity: L97.509

## 2018-01-28 HISTORY — DX: Chronic kidney disease, stage 3 (moderate): N18.3

## 2018-01-28 HISTORY — DX: Acute kidney failure, unspecified: N17.9

## 2018-01-28 HISTORY — DX: Migraine, unspecified, not intractable, without status migrainosus: G43.909

## 2018-01-28 HISTORY — DX: Diverticulosis of large intestine without perforation or abscess without bleeding: K57.30

## 2018-01-28 HISTORY — DX: Obesity, unspecified: E66.9

## 2018-01-28 HISTORY — DX: Non-pressure chronic ulcer of unspecified part of left lower leg with unspecified severity: L97.929

## 2018-01-28 HISTORY — DX: Calculus of kidney: N20.0

## 2018-01-28 HISTORY — DX: Gastro-esophageal reflux disease without esophagitis: K21.9

## 2018-01-28 HISTORY — DX: Cystitis, unspecified without hematuria: N30.90

## 2018-01-28 HISTORY — DX: Nausea with vomiting, unspecified: R11.2

## 2018-01-28 HISTORY — DX: Personal history of gestational diabetes: Z86.32

## 2018-01-28 HISTORY — DX: Pneumonia, unspecified organism: J18.9

## 2018-01-28 HISTORY — DX: Unspecified osteoarthritis, unspecified site: M19.90

## 2018-01-28 HISTORY — DX: Personal history of other infectious and parasitic diseases: Z86.19

## 2018-01-28 HISTORY — DX: Low back pain: M54.5

## 2018-01-28 HISTORY — DX: Hematuria, unspecified: R31.9

## 2018-01-28 LAB — CBC
HEMATOCRIT: 40 % (ref 36.0–46.0)
HEMOGLOBIN: 12.4 g/dL (ref 12.0–15.0)
MCH: 32.1 pg (ref 26.0–34.0)
MCHC: 31 g/dL (ref 30.0–36.0)
MCV: 103.6 fL — AB (ref 80.0–100.0)
PLATELETS: 303 10*3/uL (ref 150–400)
RBC: 3.86 MIL/uL — AB (ref 3.87–5.11)
RDW: 14.9 % (ref 11.5–15.5)
WBC: 11.5 10*3/uL — ABNORMAL HIGH (ref 4.0–10.5)
nRBC: 0 % (ref 0.0–0.2)

## 2018-01-28 LAB — BASIC METABOLIC PANEL
Anion gap: 10 (ref 5–15)
BUN: 18 mg/dL (ref 6–20)
CHLORIDE: 107 mmol/L (ref 98–111)
CO2: 24 mmol/L (ref 22–32)
Calcium: 9.6 mg/dL (ref 8.9–10.3)
Creatinine, Ser: 0.95 mg/dL (ref 0.44–1.00)
GFR calc Af Amer: >60 mL/min (ref >60)
GFR calc non Af Amer: >60 mL/min (ref >60)
GLUCOSE: 149 mg/dL — AB (ref 70–99)
POTASSIUM: 3.7 mmol/L (ref 3.5–5.1)
Sodium: 141 mmol/L (ref 135–145)

## 2018-01-28 NOTE — Pre-Procedure Instructions (Signed)
CBC and BMP results 01/28/2018 sent to Dr. Tresa Moore via epic.

## 2018-02-01 ENCOUNTER — Telehealth: Payer: Self-pay | Admitting: Family Medicine

## 2018-02-01 ENCOUNTER — Encounter: Payer: Self-pay | Admitting: Adult Health

## 2018-02-01 ENCOUNTER — Ambulatory Visit (INDEPENDENT_AMBULATORY_CARE_PROVIDER_SITE_OTHER): Payer: 59 | Admitting: Adult Health

## 2018-02-01 VITALS — BP 106/74 | HR 80 | Temp 98.3°F | Ht 62.0 in | Wt 227.0 lb

## 2018-02-01 DIAGNOSIS — F319 Bipolar disorder, unspecified: Secondary | ICD-10-CM

## 2018-02-01 DIAGNOSIS — Z1239 Encounter for other screening for malignant neoplasm of breast: Secondary | ICD-10-CM | POA: Diagnosis not present

## 2018-02-01 DIAGNOSIS — E1169 Type 2 diabetes mellitus with other specified complication: Secondary | ICD-10-CM | POA: Insufficient documentation

## 2018-02-01 DIAGNOSIS — Z1159 Encounter for screening for other viral diseases: Secondary | ICD-10-CM | POA: Diagnosis not present

## 2018-02-01 DIAGNOSIS — Z9189 Other specified personal risk factors, not elsewhere classified: Secondary | ICD-10-CM

## 2018-02-01 DIAGNOSIS — Z975 Presence of (intrauterine) contraceptive device: Secondary | ICD-10-CM | POA: Diagnosis not present

## 2018-02-01 DIAGNOSIS — E119 Type 2 diabetes mellitus without complications: Secondary | ICD-10-CM

## 2018-02-01 DIAGNOSIS — Z0000 Encounter for general adult medical examination without abnormal findings: Secondary | ICD-10-CM

## 2018-02-01 DIAGNOSIS — E669 Obesity, unspecified: Secondary | ICD-10-CM | POA: Insufficient documentation

## 2018-02-01 MED ORDER — INSULIN GLARGINE 100 UNIT/ML ~~LOC~~ SOLN: 20.0000 [IU] | Freq: Every day | SUBCUTANEOUS | 2 refills | Status: DC

## 2018-02-01 MED ORDER — METFORMIN HCL 500 MG PO TABS: ORAL_TABLET | ORAL | 1 refills | Status: DC

## 2018-02-01 NOTE — Assessment & Plan Note (Signed)
Referral to Psychiatry placed.

## 2018-02-01 NOTE — Assessment & Plan Note (Signed)
OB/GYB referral placed

## 2018-02-01 NOTE — Telephone Encounter (Signed)
Patient called back states Provider wanted her to check w/ [employer] to see which glucose meter they approve -- per patient ( One touch or Contour ) .  --Forwarding message to medical assistant.  -glh

## 2018-02-01 NOTE — Patient Instructions (Addendum)
Diabetes Mellitus and Nutrition When you have diabetes (diabetes mellitus), it is very important to have healthy eating habits because your blood sugar (glucose) levels are greatly affected by what you eat and drink. Eating healthy foods in the appropriate amounts, at about the same times every day, can help you:  Control your blood glucose.  Lower your risk of heart disease.  Improve your blood pressure.  Reach or maintain a healthy weight.  Every person with diabetes is different, and each person has different needs for a meal plan. Your health care provider may recommend that you work with a diet and nutrition specialist (dietitian) to make a meal plan that is best for you. Your meal plan may vary depending on factors such as:  The calories you need.  The medicines you take.  Your weight.  Your blood glucose, blood pressure, and cholesterol levels.  Your activity level.  Other health conditions you have, such as heart or kidney disease.  How do carbohydrates affect me? Carbohydrates affect your blood glucose level more than any other type of food. Eating carbohydrates naturally increases the amount of glucose in your blood. Carbohydrate counting is a method for keeping track of how many carbohydrates you eat. Counting carbohydrates is important to keep your blood glucose at a healthy level, especially if you use insulin or take certain oral diabetes medicines. It is important to know how many carbohydrates you can safely have in each meal. This is different for every person. Your dietitian can help you calculate how many carbohydrates you should have at each meal and for snack. Foods that contain carbohydrates include:  Bread, cereal, rice, pasta, and crackers.  Potatoes and corn.  Peas, beans, and lentils.  Milk and yogurt.  Fruit and juice.  Desserts, such as cakes, cookies, ice cream, and candy.  How does alcohol affect me? Alcohol can cause a sudden decrease in blood  glucose (hypoglycemia), especially if you use insulin or take certain oral diabetes medicines. Hypoglycemia can be a life-threatening condition. Symptoms of hypoglycemia (sleepiness, dizziness, and confusion) are similar to symptoms of having too much alcohol. If your health care provider says that alcohol is safe for you, follow these guidelines:  Limit alcohol intake to no more than 1 drink per day for nonpregnant women and 2 drinks per day for men. One drink equals 12 oz of beer, 5 oz of wine, or 1 oz of hard liquor.  Do not drink on an empty stomach.  Keep yourself hydrated with water, diet soda, or unsweetened iced tea.  Keep in mind that regular soda, juice, and other mixers may contain a lot of sugar and must be counted as carbohydrates.  What are tips for following this plan? Reading food labels  Start by checking the serving size on the label. The amount of calories, carbohydrates, fats, and other nutrients listed on the label are based on one serving of the food. Many foods contain more than one serving per package.  Check the total grams (g) of carbohydrates in one serving. You can calculate the number of servings of carbohydrates in one serving by dividing the total carbohydrates by 15. For example, if a food has 30 g of total carbohydrates, it would be equal to 2 servings of carbohydrates.  Check the number of grams (g) of saturated and trans fats in one serving. Choose foods that have low or no amount of these fats.  Check the number of milligrams (mg) of sodium in one serving. Most people   should limit total sodium intake to less than 2,300 mg per day.  Always check the nutrition information of foods labeled as "low-fat" or "nonfat". These foods may be higher in added sugar or refined carbohydrates and should be avoided.  Talk to your dietitian to identify your daily goals for nutrients listed on the label. Shopping  Avoid buying canned, premade, or processed foods. These  foods tend to be high in fat, sodium, and added sugar.  Shop around the outside edge of the grocery store. This includes fresh fruits and vegetables, bulk grains, fresh meats, and fresh dairy. Cooking  Use low-heat cooking methods, such as baking, instead of high-heat cooking methods like deep frying.  Cook using healthy oils, such as olive, canola, or sunflower oil.  Avoid cooking with butter, cream, or high-fat meats. Meal planning  Eat meals and snacks regularly, preferably at the same times every day. Avoid going long periods of time without eating.  Eat foods high in fiber, such as fresh fruits, vegetables, beans, and whole grains. Talk to your dietitian about how many servings of carbohydrates you can eat at each meal.  Eat 4-6 ounces of lean protein each day, such as lean meat, chicken, fish, eggs, or tofu. 1 ounce is equal to 1 ounce of meat, chicken, or fish, 1 egg, or 1/4 cup of tofu.  Eat some foods each day that contain healthy fats, such as avocado, nuts, seeds, and fish. Lifestyle   Check your blood glucose regularly.  Exercise at least 30 minutes 5 or more days each week, or as told by your health care provider.  Take medicines as told by your health care provider.  Do not use any products that contain nicotine or tobacco, such as cigarettes and e-cigarettes. If you need help quitting, ask your health care provider.  Work with a Social worker or diabetes educator to identify strategies to manage stress and any emotional and social challenges. What are some questions to ask my health care provider?  Do I need to meet with a diabetes educator?  Do I need to meet with a dietitian?  What number can I call if I have questions?  When are the best times to check my blood glucose? Where to find more information:  American Diabetes Association: diabetes.org/food-and-fitness/food  Academy of Nutrition and Dietetics:  PokerClues.dk  [employer] of Diabetes and Digestive and Kidney Diseases (NIH): ContactWire.be Summary  A healthy meal plan will help you control your blood glucose and maintain a healthy lifestyle.  Working with a diet and nutrition specialist (dietitian) can help you make a meal plan that is best for you.  Keep in mind that carbohydrates and alcohol have immediate effects on your blood glucose levels. It is important to count carbohydrates and to use alcohol carefully. This information is not intended to replace advice given to you by your health care provider. Make sure you discuss any questions you have with your health care provider. Document Released: 12/04/2004 Document Revised: 04/13/2016 Document Reviewed: 04/13/2016 Elsevier Interactive Patient Education  2018 [employer].   Carbohydrate Counting for Diabetes Mellitus, Adult Carbohydrate counting is a method for keeping track of how many carbohydrates you eat. Eating carbohydrates naturally increases the amount of sugar (glucose) in the blood. Counting how many carbohydrates you eat helps keep your blood glucose within normal limits, which helps you manage your diabetes (diabetes mellitus). It is important to know how many carbohydrates you can safely have in each meal. This is different for every person.  A diet and nutrition specialist (registered dietitian) can help you make a meal plan and calculate how many carbohydrates you should have at each meal and snack. Carbohydrates are found in the following foods: Grains, such as breads and cereals. Dried beans and soy products. Starchy vegetables, such as potatoes, peas, and corn. Fruit and fruit juices. Milk and yogurt. Sweets and snack foods, such as cake, cookies, candy, chips, and soft drinks.  How do I count carbohydrates? There are two ways to  count carbohydrates in food. You can use either of the methods or a combination of both. Reading "Nutrition Facts" on packaged food The "Nutrition Facts" list is included on the labels of almost all packaged foods and beverages in the [place] It includes: The serving size. Information about nutrients in each serving, including the grams (g) of carbohydrate per serving.  To use the "Nutrition Facts": Decide how many servings you will have. Multiply the number of servings by the number of carbohydrates per serving. The resulting number is the total amount of carbohydrates that you will be having.  Learning standard serving sizes of other foods When you eat foods containing carbohydrates that are not packaged or do not include "Nutrition Facts" on the label, you need to measure the servings in order to count the amount of carbohydrates: Measure the foods that you will eat with a food scale or measuring cup, if needed. Decide how many standard-size servings you will eat. Multiply the number of servings by 15. Most carbohydrate-rich foods have about 15 g of carbohydrates per serving. For example, if you eat 8 oz (170 g) of strawberries, you will have eaten 2 servings and 30 g of carbohydrates (2 servings x 15 g = 30 g). For foods that have more than one food mixed, such as soups and casseroles, you must count the carbohydrates in each food that is included.  The following list contains standard serving sizes of common carbohydrate-rich foods. Each of these servings has about 15 g of carbohydrates:  hamburger bun or  English muffin.  oz (15 mL) syrup.  oz (14 g) jelly. 1 slice of bread. 1 six-inch tortilla. 3 oz (85 g) cooked rice or pasta. 4 oz (113 g) cooked dried beans. 4 oz (113 g) starchy vegetable, such as peas, corn, or potatoes. 4 oz (113 g) hot cereal. 4 oz (113 g) mashed potatoes or  of a large baked potato. 4 oz (113 g) canned or frozen fruit. 4 oz (120 mL) fruit juice. 4-6  crackers. 6 chicken nuggets. 6 oz (170 g) unsweetened dry cereal. 6 oz (170 g) plain fat-free yogurt or yogurt sweetened with artificial sweeteners. 8 oz (240 mL) milk. 8 oz (170 g) fresh fruit or one small piece of fruit. 24 oz (680 g) popped popcorn.  Example of carbohydrate counting Sample meal 3 oz (85 g) chicken breast. 6 oz (170 g) brown rice. 4 oz (113 g) corn. 8 oz (240 mL) milk. 8 oz (170 g) strawberries with sugar-free whipped topping. Carbohydrate calculation Identify the foods that contain carbohydrates: Rice. Corn. Milk. Strawberries. Calculate how many servings you have of each food: 2 servings rice. 1 serving corn. 1 serving milk. 1 serving strawberries. Multiply each number of servings by 15 g: 2 servings rice x 15 g = 30 g. 1 serving corn x 15 g = 15 g. 1 serving milk x 15 g = 15 g. 1 serving strawberries x 15 g = 15 g. Add together all of the amounts to find  the total grams of carbohydrates eaten: 30 g + 15 g + 15 g + 15 g = 75 g of carbohydrates total. This information is not intended to replace advice given to you by your health care provider. Make sure you discuss any questions you have with your health care provider. Document Released: 03/09/2005 Document Revised: 09/27/2015 Document Reviewed: 08/21/2015 Elsevier Interactive Patient Education  2018 [employer].  Type 2 Diabetes Mellitus, Self Care, Adult When you have type 2 diabetes (type 2 diabetes mellitus), you must keep your blood sugar (glucose) under control. You can do this with:  Nutrition.  Exercise.  Lifestyle changes.  Medicines or insulin, if needed.  Support from your doctors and others.  How do I manage my blood sugar?  Check your blood sugar level every day, as often as told.  Call your doctor if your blood sugar is above your goal numbers for 2 tests in a row.  Have your A1c (hemoglobin A1c) level checked at least two times a year. Have it checked more often if your  doctor tells you to. Your doctor will set treatment goals for you. Generally, you should have these blood sugar levels:  Before meals (preprandial): 80-130 mg/dL (4.4-7.2 mmol/L).  After meals (postprandial): lower than 180 mg/dL (10 mmol/L).  A1c level: less than 7%.  What do I need to know about high blood sugar? High blood sugar is called hyperglycemia. Know the signs of high blood sugar. Signs may include:  Feeling: ? Thirsty. ? Hungry. ? Very tired.  Needing to pee (urinate) more than usual.  Blurry vision.  What do I need to know about low blood sugar? Low blood sugar is called hypoglycemia. This is when blood sugar is at or below 70 mg/dL (3.9 mmol/L). Symptoms may include:  Feeling: ? Hungry. ? Worried or nervous (anxious). ? Sweaty and clammy. ? Confused. ? Dizzy. ? Sleepy. ? Sick to your stomach (nauseous).  Having: ? A fast heartbeat (palpitations). ? A headache. ? A change in your vision. ? Jerky movements that you cannot control (seizure). ? Nightmares. ? Tingling or no feeling (numbness) around the mouth, lips, or tongue.  Having trouble with: ? Talking. ? Paying attention (concentrating). ? Moving (coordination). ? Sleeping.  Shaking.  Passing out (fainting).  Getting upset easily (irritability).  Treating low blood sugar  To treat low blood sugar, eat or drink something sugary right away. If you can think clearly and swallow safely, follow the 15:15 rule:  Take 15 grams of a fast-acting carb (carbohydrate). Some fast-acting carbs are: ? 1 tube of glucose gel. ? 3 sugar tablets (glucose pills). ? 6-8 pieces of hard candy. ? 4 oz (120 mL) of fruit juice. ? 4 oz (120 mL) regular (not diet) soda.  Check your blood sugar 15 minutes after you take the carb.  If your blood sugar is still at or below 70 mg/dL (3.9 mmol/L), take 15 grams of a carb again.  If your blood sugar does not go above 70 mg/dL (3.9 mmol/L) after 3 tries, get help  right away.  After your blood sugar goes back to normal, eat a meal or a snack within 1 hour.  Treating very low blood sugar If your blood sugar is at or below 54 mg/dL (3 mmol/L), you have very low blood sugar (severe hypoglycemia). This is an emergency. Do not wait to see if the symptoms will go away. Get medical help right away. Call your local emergency services (911 in the [place]  Do not drive yourself to the hospital. If you have very low blood sugar and you cannot eat or drink, you may need a glucagon shot (injection). A family member or friend should learn how to check your blood sugar and how to give you a glucagon shot. Ask your doctor if you need to have a glucagon shot kit at home. What else is important to manage my diabetes? Medicine Follow these instructions about insulin and diabetes medicines:  Take them as told by your doctor.  Adjust them as told by your doctor.  Do not run out of them.  Having diabetes can raise your risk for other long-term conditions. These include heart or kidney disease. Your doctor may prescribe medicines to help prevent problems from diabetes. Food   Make healthy food choices. These include: ? Chicken, fish, egg whites, and beans. ? Oats, whole wheat, bulgur, brown rice, quinoa, and millet. ? Fresh fruits and vegetables. ? Low-fat dairy products. ? Nuts, avocado, olive oil, and canola oil.  Make a food plan with a specialist (dietitian).  Follow instructions from your doctor about what you cannot eat or drink.  Drink enough fluid to keep your pee (urine) clear or pale yellow.  Eat healthy snacks between healthy meals.  Keep track of carbs that you eat. Read food labels. Learn food serving sizes.  Follow your sick day plan when you cannot eat or drink normally. Make this plan with your doctor so it is ready to use. Activity  Exercise at least 3 times a week.  Do not go more than 2 days without exercising.  Talk with your doctor  before you start a new exercise. Your doctor may need to adjust your insulin, medicines, or food. Lifestyle   Do not use any tobacco products. These include cigarettes, chewing tobacco, and e-cigarettes.If you need help quitting, ask your doctor.  Ask your doctor how much alcohol is safe for you.  Learn to deal with stress. If you need help with this, ask your doctor. Body care  Stay up to date with your shots (immunizations).  Have your eyes and feet checked by a doctor as often as told.  Check your skin and feet every day. Check for cuts, bruises, redness, blisters, or sores.  Brush your teeth and gums two times a day.  Floss at least one time a day.  Go to the dentist least one time every 6 months.  Stay at a healthy weight. General instructions   Take over-the-counter and prescription medicines only as told by your doctor.  Share your diabetes care plan with: ? Your work or school. ? People you live with.  Check your pee (urine) for ketones: ? When you are sick. ? As told by your doctor.  Carry a card or wear jewelry that says that you have diabetes.  Ask your doctor: ? Do I need to meet with a diabetes educator? ? Where can I find a support group for people with diabetes?  Keep all follow-up visits as told by your doctor. This is important. Where to find more information: To learn more about diabetes, visit:  American Diabetes Association: www.diabetes.org  American Association of Diabetes Educators: www.diabeteseducator.org/patient-resources  This information is not intended to replace advice given to you by your health care provider. Make sure you discuss any questions you have with your health care provider. Document Released: 07/01/2015 Document Revised: 08/15/2015 Document Reviewed: 04/12/2015 Elsevier Interactive Patient Education  2018 [employer].  Stop Amlodipine. Your A1c is  11.3, which means you have uncontrolled diabetes. Please start  Metformin 544m- 1/2 tablet with dinner for one week 1 full tablet with dinner for one week 1 full tablet with breakfast and with dinner- hold at this dose Start once daily Lantus 20 U injection Please call your insurance company and ask which Glucometer is covered your plan, then please call [facility] we can send in the correct order. Please check you blood sugar fasting each morning and each afternoon. If you experience any low readings <100, please call clinic. Please call clinic with any questions/concerns. Follow-up in 3 weeks, please return fasting so we can obtain additional. [place]

## 2018-02-01 NOTE — Assessment & Plan Note (Signed)
Lab Results  Component Value Date   HGBA1C 11.3 (H) 12/28/2017   Please start Metformin 500mg - 1/2 tablet with dinner for one week 1 full tablet with dinner for one week 1 full tablet with breakfast and with dinner- hold at this dose Start once daily Lantus 20 U injection Please call your insurance company and ask which Glucometer is covered your plan, then please call us so we can send in the correct order. Please check you blood sugar fasting each morning and each afternoon. If you experience any low readings <100, please call clinic. Please call clinic with any questions/concerns.

## 2018-02-01 NOTE — Assessment & Plan Note (Addendum)
Stop Amlodipine- since BP 106/74 and pt exp dizziness with position change  Please call your insurance company and ask which Glucometer is covered your plan, then please call us so we can send in the correct order. Please check you blood sugar fasting each morning and each afternoon. If you experience any low readings <100, please call clinic. Please call clinic with any questions/concerns. Follow-up in 3 weeks, please return fasting so we can obtain additional.

## 2018-02-01 NOTE — Progress Notes (Addendum)
Subjective:    Patient ID: Pamela Carney, female    DOB: 05-24-1965, 52 y.o.   MRN: [id number]  HPI:  Pamela Carney is here to establish as a new pt.  Pamela Carney is a pleasant 52 year old female. PMH: Migraine HA with excessive NSAID use Bipolar- previously on Depakote and Lamictal- urgent referral to Psychiatry placed 12/29/17- Presented to ED with dysuria, found to have acute kidney injury with CKD III, transferred treated in ICU- fluids, IV ABX,  underwent percutaneous placement of a nephrostomy tube managed by IR. Plan is for outpatient follow-up with urology for definitive treatment of stone. Acute kidney injury resolved and Pamela Carney will follow-up with nephrology as an outpatient, to f/u with Dr. Joelyn Oms.  HTN- previously on HCTZ, losartan, bisoprolol Pamela Carney completed course of oral Keflex Pamela Carney has percutaneous nephrolithotomy ureteroscopy/lithitripsy with stent scheduled this Friday 1115/19 and stent exchange 02/07/18 Upon chart review, it was discovered that her A1c was sig elevated Pamela Carney also reports polyuria/polydipsia and 40 lb weight loss over the last yr Pamela Carney was not started on anti-diabetic medications at discharge Pamela Carney reports some dizziness with position changes, BP today 106/74  Lab Results  Component Value Date   HGBA1C 11.3 (H) 12/28/2017   Pamela Carney has not had primary care in >2 years Pamela Carney has not been seen by Behavioral Health >2 years Pamela Carney denies thoughts of harming herself/others and had previously been off all mental health medications for years. Pamela Carney smokes pack/day, >37 years Pamela Carney is married  Patient Care Team    Relationship Specialty Notifications Start End  Angeleena Dueitt, Valetta Fuller D, NP PCP - General Family Medicine  02/01/18   Alexis Frock, MD Consulting Physician Urology  02/01/18     Patient Active Problem List   Diagnosis Date Noted  . New onset type 2 diabetes mellitus ([place]) 02/01/2018  . Bipolar 1 disorder ([place]) 02/01/2018  . IUD (intrauterine device) in place 02/01/2018  .  Obesity 02/01/2018  . Healthcare maintenance 02/01/2018  . CKD (chronic kidney disease), stage III ([place]) 12/28/2017  . 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 Acute cystitis with hematuria   . Chronic interstitial nephritis 12/24/2017  . Generalized weakness 12/24/2017  . Sepsis ([place]) 12/24/2017  . Hypokalemia 12/24/2017  . Oral candidiasis 12/24/2017  . Neuropathy involving both lower extremities 01/31/2014  . Perimenopausal 12/29/2012  . Family planning, IUD (intrauterine device) check/reinsertion/removal 12/28/2012  . Pain 12/27/2012     Past Medical History:  Diagnosis Date  . Abnormal Pap smear   . AKI (acute kidney injury) ([place]) 12/28/2017  . Anxiety   . Arthritis    bil knees, neck  . Bipolar 1 disorder ([place])   . Cataract    Mild  . Cholelithiasis 12/23/2017   noted on CT renal, pt unaware  . Chronic kidney disease (CKD), stage III (moderate) ([facility])   . Cystitis   . Genital warts    Hx of genital  . GERD (gastroesophageal reflux disease)   . Heart murmur    childhood  . Hematuria   . History of blood transfusion   . History of ectopic pregnancy   . History of gestational diabetes   . History of kidney stones   . History of sepsis    after ectopic pregnancy  . Hypertension   . Idiopathic peripheral neuropathy    both feet  . Leg ulcer, left ([place])   . Low back pain   . Migraines   . Obesity   . Oral candidiasis   . Pneumonia   .  PONV (postoperative nausea and vomiting)    prolonged sedation  . Recurrent UTI   . 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 Sigmoid diverticulosis 12/23/2017   noted on CT renal, pt unaware  . Ulcer of foot ([place])    Left  . Weakness      Past Surgical History:  Procedure Laterality Date  . CERVICAL CONE BIOPSY    . CESAREAN SECTION     x3  . DILATION AND CURETTAGE OF UTERUS    . ECTOPIC PREGNANCY SURGERY    . IR NEPHROSTOMY PLACEMENT LEFT   12/25/2017  . OVARIAN CYST REMOVAL    . UNILATERAL SALPINGECTOMY     Pt unsure of which fallopian tube was removed  . WISDOM TOOTH EXTRACTION       Family History  Problem Relation Age of Onset  . Diabetes Paternal Grandfather   . COPD Paternal Grandfather   . Heart disease Paternal Grandfather   . Cancer Paternal Grandfather        bone  . Cancer Paternal Grandmother   . Heart disease Paternal Grandmother   . Cancer Father   . Hypertension Father   . Heart attack Father   . Alcohol abuse Father   . Depression Father   . Heart failure Mother   . Diabetes Mother   . Heart disease Mother   . Hyperlipidemia Mother   . Hypertension Mother   . Heart attack Mother   . Peripheral vascular disease Mother   . Depression Mother   . COPD Mother   . Hypertension Sister   . Heart attack Sister   . Stroke Maternal Grandmother      Social History   Substance and Sexual Activity  Drug Use No     Social History   Substance and Sexual Activity  Alcohol Use No  . Alcohol/week: 0.0 standard drinks     Social History   Tobacco Use  Smoking Status Current Every Day Smoker  . Packs/day: 1.00  . Years: 37.00  . Pack years: 37.00  . Types: Cigarettes  Smokeless Tobacco Never Used     Outpatient Encounter Medications as of 02/01/2018  Medication Sig  . levonorgestrel (MIRENA) 20 MCG/24HR IUD 1 each by Intrauterine route once.  . Multiple Vitamins-Minerals (ONE-A-DAY WOMENS 50+ ADVANTAGE) TABS Take 1 tablet by mouth daily.  . [DISCONTINUED] amLODipine (NORVASC) 5 MG tablet Take 1 tablet (5 mg total) by mouth daily.  . insulin glargine (LANTUS) 100 UNIT/ML injection Inject 0.2 mLs (20 Units total) into the skin daily.  . metFORMIN (GLUCOPHAGE) 500 MG tablet 1/2 tablet with dinner for one week.  1 take one tablet with dinner for one week. 1 tablet with breakfast, one tablet with dinner.  . [DISCONTINUED] acetaminophen (TYLENOL) 500 MG tablet Take 1,000 mg by mouth every 8  (eight) hours as needed for mild pain.  . [DISCONTINUED] bisoprolol (ZEBETA) 10 MG tablet Take 1 tablet (10 mg total) by mouth daily. (Patient not taking: Reported on 01/28/2018)  . [DISCONTINUED] divalproex (DEPAKOTE ER) 500 MG 24 hr tablet Take 4 tablets (2,000 mg total) by mouth daily. (Patient not taking: Reported on 01/28/2018)  . [DISCONTINUED] lamoTRIgine (LAMICTAL) 100 MG tablet Take 1 tablet (100 mg total) by mouth daily. (Patient not taking: Reported on 01/28/2018)  . [DISCONTINUED] nystatin (MYCOSTATIN) 100000 UNIT/ML suspension Take 5 mLs (500,000 Units total) by mouth 4 (four) times daily. (Patient not taking:  Reported on 01/28/2018)  . [DISCONTINUED] OVER THE COUNTER MEDICATION Take 0.5 drops by mouth daily as needed (pain).  . [DISCONTINUED] polyethylene glycol (MIRALAX / GLYCOLAX) packet Take 17 g by mouth daily. (Patient taking differently: Take 17 g by mouth every other day. )  . [DISCONTINUED] sodium bicarbonate 650 MG tablet Take 1 tablet (650 mg total) by mouth 2 (two) times daily. (Patient not taking: Reported on 01/28/2018)   No facility-administered encounter medications on file as of 02/01/2018.     Allergies: Sulfa antibiotics; Latex; and Tape  Body mass index is 41.52 kg/m.  Blood pressure 106/74, pulse 80, temperature 98.3 F (36.8 C), temperature source Oral, height 5\' 2"  (1.575 m), weight 227 lb (103 kg), SpO2 96 %.   Review of Systems  Constitutional: Positive for activity change, appetite change and fatigue. Negative for chills, diaphoresis, fever and unexpected weight change.  Respiratory: Negative for cough, chest tightness, shortness of breath, wheezing and stridor.   Cardiovascular: Negative for chest pain, palpitations and leg swelling.  Gastrointestinal: Negative for abdominal distention, abdominal pain, blood in stool, constipation, diarrhea, nausea and vomiting.  Genitourinary: Positive for difficulty urinating, dysuria and flank pain.  Musculoskeletal:  Positive for arthralgias and back pain.  Skin: Positive for rash and wound. Negative for color change.  Neurological: Positive for dizziness and headaches.       Dizziness with position changes   Hematological: Does not bruise/bleed easily.  Psychiatric/Behavioral: Positive for sleep disturbance. Negative for agitation, behavioral problems, confusion, decreased concentration, dysphoric mood, hallucinations, self-injury and suicidal ideas. The patient is not nervous/anxious and is not hyperactive.        Objective:   Physical Exam  Constitutional: Pamela Carney is oriented to person, place, and time. Pamela Carney appears well-developed and well-nourished. No distress.  HENT:  Head: Normocephalic and atraumatic.  Right Ear: External ear normal.  Left Ear: External ear normal.  Nose: Nose normal.  Mouth/Throat: Oropharynx is clear and moist.  Eyes: Pupils are equal, round, and reactive to light. Conjunctivae and EOM are normal.  Cardiovascular: Normal rate, regular rhythm, normal heart sounds and intact distal pulses.  Pulmonary/Chest: Effort normal and breath sounds normal. No stridor. No respiratory distress. Pamela Carney has no wheezes. Pamela Carney has no rales. Pamela Carney exhibits no tenderness.  Abdominal: Pamela Carney exhibits no distension and no mass. There is no tenderness. There is no rebound and no guarding. No hernia.  Neurological: Pamela Carney is alert and oriented to person, place, and time.  Skin: Skin is warm and dry. Capillary refill takes less than 2 seconds. Pamela Carney is not diaphoretic.     Intact L nephrostomy tube  Psychiatric: Pamela Carney has a normal mood and affect. Her behavior is normal. Judgment and thought content normal.  Nursing note and vitals reviewed.     Assessment & Plan:   1. Screening for breast cancer   2. IUD (intrauterine device) in place   3. Bipolar 1 disorder ([place])   4. Encounter for hepatitis C virus screening test for high risk patient   5. New onset type 2 diabetes mellitus ([place])   6. Healthcare maintenance      New onset type 2 diabetes mellitus ([place]) Lab Results  Component Value Date   HGBA1C 11.3 (H) 12/28/2017   Please start Metformin 500mg - 1/2 tablet with dinner for one week 1 full tablet with dinner for one week 1 full tablet with breakfast and with dinner- hold at this dose Start once daily Lantus 20 U injection Please call your insurance company and ask  which Glucometer is covered your plan, then please call us so we can send in the correct order. Please check you blood sugar fasting each morning and each afternoon. If you experience any low readings <100, please call clinic. Please call clinic with any questions/concerns.   Bipolar 1 disorder [facility]) Referral to Psychiatry placed  IUD (intrauterine device) in place OB/GYB referral placed  Healthcare maintenance Stop Amlodipine- since BP 106/74 and pt exp dizziness with position change  Please call your insurance company and ask which Glucometer is covered your plan, then please call us so we can send in the correct order. Please check you blood sugar fasting each morning and each afternoon. If you experience any low readings <100, please call clinic. Please call clinic with any questions/concerns. Follow-up in 3 weeks, please return fasting so we can obtain additional.  Pt was in the office today for 60+ minutes, I spent >75% of time  in face to face counseling of various medical concerns and in coordination of care  FOLLOW-UP:  Return in about 3 weeks (around 02/22/2018) for Regular Follow Up, Diabetes, Obesity.

## 2018-02-02 MED ORDER — GLUCOSE BLOOD VI STRP: ORAL_STRIP | 3 refills | Status: DC

## 2018-02-02 MED ORDER — ONETOUCH LANCETS MISC: 3 refills | Status: AC

## 2018-02-02 MED ORDER — ONETOUCH VERIO W/DEVICE KIT: PACK | 0 refills | Status: AC

## 2018-02-02 MED ORDER — INSULIN DEGLUDEC 100 UNIT/ML ~~LOC~~ SOPN: 20.0000 [IU] | PEN_INJECTOR | Freq: Every day | SUBCUTANEOUS | 3 refills | Status: DC

## 2018-02-02 NOTE — Addendum Note (Signed)
Addended by: Mina Marble D on: 02/02/2018 04:07 PM   Modules accepted: Orders

## 2018-02-02 NOTE — Telephone Encounter (Signed)
RXs sent to pharmacy.  Pt informed.  Charyl Bigger, CMA

## 2018-02-04 ENCOUNTER — Encounter (HOSPITAL_COMMUNITY)
Admission: RE | Disposition: A | Discharge status: Home / Self Care | Payer: Self-pay | Source: Ambulatory Visit | Attending: Urology

## 2018-02-04 ENCOUNTER — Inpatient Hospital Stay (HOSPITAL_COMMUNITY): Payer: 59

## 2018-02-04 ENCOUNTER — Inpatient Hospital Stay (HOSPITAL_COMMUNITY)
Admission: RE | Admit: 2018-02-04 | Discharge: 2018-02-08 | DRG: 660 | Disposition: A | Discharge status: Home / Self Care | Payer: 59 | Source: Ambulatory Visit | Attending: Urology | Admitting: Urology

## 2018-02-04 ENCOUNTER — Encounter (HOSPITAL_COMMUNITY): Payer: Self-pay

## 2018-02-04 ENCOUNTER — Inpatient Hospital Stay (HOSPITAL_COMMUNITY): Payer: 59 | Admitting: Anesthesiology

## 2018-02-04 ENCOUNTER — Other Ambulatory Visit: Payer: Self-pay

## 2018-02-04 DIAGNOSIS — F419 Anxiety disorder, unspecified: Secondary | ICD-10-CM | POA: Diagnosis present

## 2018-02-04 DIAGNOSIS — N200 Calculus of kidney: Principal | ICD-10-CM | POA: Diagnosis present

## 2018-02-04 DIAGNOSIS — Z9079 Acquired absence of other genital organ(s): Secondary | ICD-10-CM

## 2018-02-04 DIAGNOSIS — G609 Hereditary and idiopathic neuropathy, unspecified: Secondary | ICD-10-CM | POA: Diagnosis present

## 2018-02-04 DIAGNOSIS — Z825 Family history of asthma and other chronic lower respiratory diseases: Secondary | ICD-10-CM

## 2018-02-04 DIAGNOSIS — K8020 Calculus of gallbladder without cholecystitis without obstruction: Secondary | ICD-10-CM | POA: Diagnosis present

## 2018-02-04 DIAGNOSIS — Z833 Family history of diabetes mellitus: Secondary | ICD-10-CM | POA: Diagnosis not present

## 2018-02-04 DIAGNOSIS — K219 Gastro-esophageal reflux disease without esophagitis: Secondary | ICD-10-CM | POA: Diagnosis present

## 2018-02-04 DIAGNOSIS — F17210 Nicotine dependence, cigarettes, uncomplicated: Secondary | ICD-10-CM | POA: Diagnosis present

## 2018-02-04 DIAGNOSIS — Z91040 Latex allergy status: Secondary | ICD-10-CM | POA: Diagnosis not present

## 2018-02-04 DIAGNOSIS — Z87440 Personal history of urinary (tract) infections: Secondary | ICD-10-CM

## 2018-02-04 DIAGNOSIS — Z23 Encounter for immunization: Secondary | ICD-10-CM | POA: Diagnosis not present

## 2018-02-04 DIAGNOSIS — I129 Hypertensive chronic kidney disease with stage 1 through stage 4 chronic kidney disease, or unspecified chronic kidney disease: Secondary | ICD-10-CM | POA: Diagnosis present

## 2018-02-04 DIAGNOSIS — Z882 Allergy status to sulfonamides status: Secondary | ICD-10-CM | POA: Diagnosis not present

## 2018-02-04 DIAGNOSIS — N183 Chronic kidney disease, stage 3 (moderate): Secondary | ICD-10-CM | POA: Diagnosis present

## 2018-02-04 DIAGNOSIS — Z8249 Family history of ischemic heart disease and other diseases of the circulatory system: Secondary | ICD-10-CM

## 2018-02-04 DIAGNOSIS — Z6841 Body Mass Index (BMI) 40.0 and over, adult: Secondary | ICD-10-CM | POA: Diagnosis not present

## 2018-02-04 DIAGNOSIS — F319 Bipolar disorder, unspecified: Secondary | ICD-10-CM | POA: Diagnosis present

## 2018-02-04 DIAGNOSIS — Z91048 Other nonmedicinal substance allergy status: Secondary | ICD-10-CM

## 2018-02-04 DIAGNOSIS — Z87442 Personal history of urinary calculi: Secondary | ICD-10-CM | POA: Diagnosis not present

## 2018-02-04 HISTORY — PX: NEPHROLITHOTOMY: SHX5134

## 2018-02-04 HISTORY — PX: CYSTOSCOPY/URETEROSCOPY/HOLMIUM LASER/STENT PLACEMENT: SHX6546

## 2018-02-04 LAB — HEMOGLOBIN AND HEMATOCRIT, BLOOD
HCT: 41.5 % (ref 36.0–46.0)
Hemoglobin: 12.7 g/dL (ref 12.0–15.0)

## 2018-02-04 LAB — GLUCOSE, CAPILLARY
Glucose-Capillary: 153 mg/dL — ABNORMAL HIGH (ref 70–99)
Glucose-Capillary: 210 mg/dL — ABNORMAL HIGH (ref 70–99)
Glucose-Capillary: 216 mg/dL — ABNORMAL HIGH (ref 70–99)
Glucose-Capillary: 234 mg/dL — ABNORMAL HIGH (ref 70–99)

## 2018-02-04 SURGERY — NEPHROLITHOTOMY PERCUTANEOUS
Anesthesia: General | Laterality: Right

## 2018-02-04 MED ORDER — ROCURONIUM BROMIDE 10 MG/ML (PF) SYRINGE
PREFILLED_SYRINGE | INTRAVENOUS | Status: DC | PRN
  Administered 2018-02-04: 10 mg via INTRAVENOUS
  Administered 2018-02-04: 30 mg via INTRAVENOUS
  Administered 2018-02-04: 50 mg via INTRAVENOUS

## 2018-02-04 MED ORDER — PROMETHAZINE HCL 25 MG/ML IJ SOLN: 6.2500 mg | INTRAMUSCULAR | Status: DC | PRN

## 2018-02-04 MED ORDER — ACETAMINOPHEN 500 MG PO TABS
1000.0000 mg | ORAL_TABLET | Freq: Three times a day (TID) | ORAL | Status: DC
  Administered 2018-02-04 – 2018-02-08 (×11): 1000 mg via ORAL
  Filled 2018-02-04 (×11): qty 2

## 2018-02-04 MED ORDER — SUGAMMADEX SODIUM 500 MG/5ML IV SOLN
INTRAVENOUS | Status: DC | PRN
  Administered 2018-02-04: 300 mg via INTRAVENOUS

## 2018-02-04 MED ORDER — HYDROMORPHONE HCL 1 MG/ML IJ SOLN
0.2500 mg | INTRAMUSCULAR | Status: DC | PRN
  Administered 2018-02-04: 0.5 mg via INTRAVENOUS

## 2018-02-04 MED ORDER — LACTATED RINGERS IV SOLN
INTRAVENOUS | Status: DC
  Administered 2018-02-04: 1000 mL via INTRAVENOUS
  Administered 2018-02-04: 14:00:00 via INTRAVENOUS

## 2018-02-04 MED ORDER — OXYCODONE HCL 5 MG/5ML PO SOLN
5.0000 mg | Freq: Once | ORAL | Status: DC | PRN
  Filled 2018-02-04: qty 5

## 2018-02-04 MED ORDER — ACETAMINOPHEN 10 MG/ML IV SOLN
1000.0000 mg | Freq: Once | INTRAVENOUS | Status: AC
  Administered 2018-02-04: 1000 mg via INTRAVENOUS

## 2018-02-04 MED ORDER — DEXAMETHASONE SODIUM PHOSPHATE 10 MG/ML IJ SOLN
INTRAMUSCULAR | Status: AC
  Filled 2018-02-04: qty 1

## 2018-02-04 MED ORDER — INSULIN ASPART 100 UNIT/ML ~~LOC~~ SOLN
0.0000 [IU] | Freq: Three times a day (TID) | SUBCUTANEOUS | Status: DC
  Administered 2018-02-04: 5 [IU] via SUBCUTANEOUS
  Administered 2018-02-05 (×2): 2 [IU] via SUBCUTANEOUS
  Administered 2018-02-05: 5 [IU] via SUBCUTANEOUS
  Administered 2018-02-06 – 2018-02-08 (×5): 2 [IU] via SUBCUTANEOUS

## 2018-02-04 MED ORDER — MIDAZOLAM HCL 5 MG/5ML IJ SOLN
INTRAMUSCULAR | Status: DC | PRN
  Administered 2018-02-04: 2 mg via INTRAVENOUS

## 2018-02-04 MED ORDER — OXYCODONE HCL 5 MG PO TABS: 5.0000 mg | ORAL_TABLET | Freq: Once | ORAL | Status: DC | PRN

## 2018-02-04 MED ORDER — INSULIN ASPART 100 UNIT/ML ~~LOC~~ SOLN
4.0000 [IU] | Freq: Three times a day (TID) | SUBCUTANEOUS | Status: DC
  Administered 2018-02-05 – 2018-02-08 (×6): 4 [IU] via SUBCUTANEOUS

## 2018-02-04 MED ORDER — ONDANSETRON HCL 4 MG/2ML IJ SOLN
INTRAMUSCULAR | Status: DC | PRN
  Administered 2018-02-04: 4 mg via INTRAVENOUS

## 2018-02-04 MED ORDER — INFLUENZA VAC SPLIT QUAD 0.5 ML IM SUSY
0.5000 mL | PREFILLED_SYRINGE | INTRAMUSCULAR | Status: AC
  Administered 2018-02-05: 0.5 mL via INTRAMUSCULAR
  Filled 2018-02-04: qty 0.5

## 2018-02-04 MED ORDER — ONDANSETRON HCL 4 MG/2ML IJ SOLN
INTRAMUSCULAR | Status: AC
  Filled 2018-02-04: qty 2

## 2018-02-04 MED ORDER — LIDOCAINE 2% (20 MG/ML) 5 ML SYRINGE
INTRAMUSCULAR | Status: DC | PRN
  Administered 2018-02-04: 100 mg via INTRAVENOUS

## 2018-02-04 MED ORDER — ROCURONIUM BROMIDE 10 MG/ML (PF) SYRINGE
PREFILLED_SYRINGE | INTRAVENOUS | Status: AC
  Filled 2018-02-04: qty 10

## 2018-02-04 MED ORDER — HYDROMORPHONE HCL 1 MG/ML IJ SOLN
INTRAMUSCULAR | Status: AC
  Administered 2018-02-04: 0.5 mg via INTRAVENOUS
  Filled 2018-02-04: qty 1

## 2018-02-04 MED ORDER — PHENYLEPHRINE 40 MCG/ML (10ML) SYRINGE FOR IV PUSH (FOR BLOOD PRESSURE SUPPORT)
PREFILLED_SYRINGE | INTRAVENOUS | Status: AC
  Filled 2018-02-04: qty 10

## 2018-02-04 MED ORDER — EPHEDRINE 5 MG/ML INJ
INTRAVENOUS | Status: AC
  Filled 2018-02-04: qty 10

## 2018-02-04 MED ORDER — ENSURE ENLIVE PO LIQD
237.0000 mL | Freq: Two times a day (BID) | ORAL | Status: DC
  Administered 2018-02-05: 237 mL via ORAL

## 2018-02-04 MED ORDER — SODIUM CHLORIDE 0.9 % IV SOLN
INTRAVENOUS | Status: DC
  Administered 2018-02-04 – 2018-02-07 (×5): via INTRAVENOUS

## 2018-02-04 MED ORDER — FENTANYL CITRATE (PF) 100 MCG/2ML IJ SOLN
INTRAMUSCULAR | Status: AC
  Filled 2018-02-04: qty 2

## 2018-02-04 MED ORDER — OXYCODONE HCL 5 MG PO TABS
5.0000 mg | ORAL_TABLET | ORAL | Status: DC | PRN
  Administered 2018-02-05 – 2018-02-08 (×8): 5 mg via ORAL
  Filled 2018-02-04 (×8): qty 1

## 2018-02-04 MED ORDER — 0.9 % SODIUM CHLORIDE (POUR BTL) OPTIME
TOPICAL | Status: DC | PRN
  Administered 2018-02-04: 1000 mL

## 2018-02-04 MED ORDER — SUCCINYLCHOLINE CHLORIDE 200 MG/10ML IV SOSY
PREFILLED_SYRINGE | INTRAVENOUS | Status: AC
  Filled 2018-02-04: qty 10

## 2018-02-04 MED ORDER — SODIUM CHLORIDE 0.9 % IR SOLN
Status: DC | PRN
  Administered 2018-02-04: 21000 mL

## 2018-02-04 MED ORDER — INSULIN ASPART 100 UNIT/ML ~~LOC~~ SOLN: 5.0000 [IU] | Freq: Once | SUBCUTANEOUS | Status: DC

## 2018-02-04 MED ORDER — IOHEXOL 300 MG/ML  SOLN
INTRAMUSCULAR | Status: DC | PRN
  Administered 2018-02-04: 56 mL

## 2018-02-04 MED ORDER — LIDOCAINE 2% (20 MG/ML) 5 ML SYRINGE
INTRAMUSCULAR | Status: AC
  Filled 2018-02-04: qty 5

## 2018-02-04 MED ORDER — PNEUMOCOCCAL VAC POLYVALENT 25 MCG/0.5ML IJ INJ
0.5000 mL | INJECTION | INTRAMUSCULAR | Status: AC
  Administered 2018-02-05: 0.5 mL via INTRAMUSCULAR
  Filled 2018-02-04: qty 0.5

## 2018-02-04 MED ORDER — FENTANYL CITRATE (PF) 100 MCG/2ML IJ SOLN
INTRAMUSCULAR | Status: DC | PRN
  Administered 2018-02-04: 100 ug via INTRAVENOUS
  Administered 2018-02-04 (×4): 50 ug via INTRAVENOUS

## 2018-02-04 MED ORDER — EPHEDRINE SULFATE-NACL 50-0.9 MG/10ML-% IV SOSY
PREFILLED_SYRINGE | INTRAVENOUS | Status: DC | PRN
  Administered 2018-02-04: 15 mg via INTRAVENOUS
  Administered 2018-02-04: 10 mg via INTRAVENOUS
  Administered 2018-02-04: 5 mg via INTRAVENOUS

## 2018-02-04 MED ORDER — INSULIN ASPART 100 UNIT/ML ~~LOC~~ SOLN
SUBCUTANEOUS | Status: AC
  Filled 2018-02-04: qty 1

## 2018-02-04 MED ORDER — INSULIN ASPART 100 UNIT/ML ~~LOC~~ SOLN
0.0000 [IU] | Freq: Every day | SUBCUTANEOUS | Status: DC
  Administered 2018-02-04 – 2018-02-05 (×2): 2 [IU] via SUBCUTANEOUS
  Administered 2018-02-07: 3 [IU] via SUBCUTANEOUS

## 2018-02-04 MED ORDER — SUGAMMADEX SODIUM 500 MG/5ML IV SOLN
INTRAVENOUS | Status: AC
  Filled 2018-02-04: qty 5

## 2018-02-04 MED ORDER — HYDROMORPHONE HCL 1 MG/ML IJ SOLN
0.5000 mg | INTRAMUSCULAR | Status: DC | PRN
  Administered 2018-02-05 – 2018-02-08 (×3): 0.5 mg via INTRAVENOUS
  Filled 2018-02-04 (×3): qty 0.5

## 2018-02-04 MED ORDER — MIDAZOLAM HCL 2 MG/2ML IJ SOLN
INTRAMUSCULAR | Status: AC
  Filled 2018-02-04: qty 2

## 2018-02-04 MED ORDER — GENTAMICIN SULFATE 40 MG/ML IJ SOLN
5.0000 mg/kg | INTRAVENOUS | Status: AC
  Administered 2018-02-04: 360 mg via INTRAVENOUS
  Filled 2018-02-04: qty 9

## 2018-02-04 MED ORDER — PROPOFOL 10 MG/ML IV BOLUS
INTRAVENOUS | Status: DC | PRN
  Administered 2018-02-04: 150 mg via INTRAVENOUS

## 2018-02-04 MED ORDER — DEXAMETHASONE SODIUM PHOSPHATE 10 MG/ML IJ SOLN
INTRAMUSCULAR | Status: DC | PRN
  Administered 2018-02-04: 8 mg via INTRAVENOUS

## 2018-02-04 MED ORDER — ACETAMINOPHEN 10 MG/ML IV SOLN
INTRAVENOUS | Status: AC
  Filled 2018-02-04: qty 100

## 2018-02-04 MED ORDER — SUCCINYLCHOLINE CHLORIDE 200 MG/10ML IV SOSY
PREFILLED_SYRINGE | INTRAVENOUS | Status: DC | PRN
  Administered 2018-02-04: 120 mg via INTRAVENOUS

## 2018-02-04 MED ORDER — HYDROMORPHONE HCL 1 MG/ML IJ SOLN
0.2500 mg | INTRAMUSCULAR | Status: DC | PRN
  Administered 2018-02-04 (×2): 0.5 mg via INTRAVENOUS

## 2018-02-04 MED ORDER — SODIUM CHLORIDE 0.9 % IR SOLN
Status: DC | PRN
  Administered 2018-02-04: 3000 mL via INTRAVESICAL

## 2018-02-04 SURGICAL SUPPLY — 72 items
BAG URINE DRAINAGE (UROLOGICAL SUPPLIES) ×4 IMPLANT
BAG URO CATCHER STRL LF (MISCELLANEOUS) ×4 IMPLANT
BASKET LASER NITINOL 1.9FR (BASKET) ×8 IMPLANT
BASKET ZERO TIP NITINOL 2.4FR (BASKET) IMPLANT
BENZOIN TINCTURE PRP APPL 2/3 (GAUZE/BANDAGES/DRESSINGS) ×4 IMPLANT
BLADE SURG 15 STRL LF DISP TIS (BLADE) ×2 IMPLANT
BLADE SURG 15 STRL SS (BLADE) ×2
CATH FOLEY 2W COUNCIL 20FR 5CC (CATHETERS) IMPLANT
CATH FOLEY 2WAY SLVR  5CC 16FR (CATHETERS)
CATH FOLEY 2WAY SLVR 5CC 16FR (CATHETERS) IMPLANT
CATH IMAGER II 65CM (CATHETERS) ×4 IMPLANT
CATH INTERMIT  6FR 70CM (CATHETERS) ×4 IMPLANT
CATH MULTI PURPOSE 16FR DRAIN (CATHETERS) IMPLANT
CATH ROBINSON RED A/P 20FR (CATHETERS) IMPLANT
CATH ULTRATHANE 14FR (CATHETERS) ×4 IMPLANT
CATH X-FORCE N30 NEPHROSTOMY (TUBING) ×4 IMPLANT
CHLORAPREP W/TINT 26ML (MISCELLANEOUS) ×4 IMPLANT
CLOTH BEACON ORANGE TIMEOUT ST (SAFETY) IMPLANT
COVER SURGICAL LIGHT HANDLE (MISCELLANEOUS) ×4 IMPLANT
COVER WAND RF STERILE (DRAPES) ×4 IMPLANT
DRAPE C-ARM 42X120 X-RAY (DRAPES) ×4 IMPLANT
DRAPE LINGEMAN PERC (DRAPES) ×4 IMPLANT
DRAPE SHEET LG 3/4 BI-LAMINATE (DRAPES) ×4 IMPLANT
DRAPE SURG IRRIG POUCH 19X23 (DRAPES) ×4 IMPLANT
DRSG PAD ABDOMINAL 8X10 ST (GAUZE/BANDAGES/DRESSINGS) IMPLANT
DRSG TEGADERM 8X12 (GAUZE/BANDAGES/DRESSINGS) ×4 IMPLANT
EXTRACTOR STONE 1.7FRX115CM (UROLOGICAL SUPPLIES) IMPLANT
FIBER LASER FLEXIVA 1000 (UROLOGICAL SUPPLIES) IMPLANT
FIBER LASER FLEXIVA 365 (UROLOGICAL SUPPLIES) IMPLANT
FIBER LASER FLEXIVA 550 (UROLOGICAL SUPPLIES) IMPLANT
FIBER LASER TRAC TIP (UROLOGICAL SUPPLIES) ×4 IMPLANT
GAUZE 4X4 16PLY RFD (DISPOSABLE) ×4 IMPLANT
GAUZE SPONGE 4X4 12PLY STRL (GAUZE/BANDAGES/DRESSINGS) IMPLANT
GLOVE BIOGEL M STRL SZ7.5 (GLOVE) IMPLANT
GLOVE SURG SS PI 7.5 STRL IVOR (GLOVE) ×8 IMPLANT
GOWN STRL REUS W/TWL LRG LVL3 (GOWN DISPOSABLE) ×8 IMPLANT
GUIDEWIRE AMPLAZ .035X145 (WIRE) ×8 IMPLANT
GUIDEWIRE ANG ZIPWIRE 038X150 (WIRE) ×12 IMPLANT
GUIDEWIRE STR DUAL SENSOR (WIRE) ×8 IMPLANT
IV NS 1000ML (IV SOLUTION)
IV NS 1000ML BAXH (IV SOLUTION) IMPLANT
IV SET EXTENSION CATH 6 NF (IV SETS) ×4 IMPLANT
KIT BASIN OR (CUSTOM PROCEDURE TRAY) ×4 IMPLANT
MANIFOLD NEPTUNE II (INSTRUMENTS) ×4 IMPLANT
NEEDLE TROCAR 18X15 ECHO (NEEDLE) IMPLANT
NEEDLE TROCAR 18X20 (NEEDLE) ×4 IMPLANT
NS IRRIG 1000ML POUR BTL (IV SOLUTION) ×4 IMPLANT
PACK CYSTO (CUSTOM PROCEDURE TRAY) ×4 IMPLANT
PAD ABD 8X10 STRL (GAUZE/BANDAGES/DRESSINGS) ×8 IMPLANT
PROBE LITHOCLAST ULTRA 3.8X403 (UROLOGICAL SUPPLIES) ×4 IMPLANT
PROBE PNEUMATIC 1.0MMX570MM (UROLOGICAL SUPPLIES) IMPLANT
SET IRRIG Y TYPE TUR BLADDER L (SET/KITS/TRAYS/PACK) ×4 IMPLANT
SHEATH PEELAWAY SET 9 (SHEATH) ×4 IMPLANT
SHEATH URETERAL 12FRX28CM (UROLOGICAL SUPPLIES) ×4 IMPLANT
SHEATH URETERAL 12FRX35CM (MISCELLANEOUS) IMPLANT
SPONGE LAP 4X18 RFD (DISPOSABLE) ×4 IMPLANT
STENT URET 6FRX24 CONTOUR (STENTS) ×4 IMPLANT
SUT SILK 2 0 30  PSL (SUTURE) ×4
SUT SILK 2 0 30 PSL (SUTURE) ×4 IMPLANT
SYR 10ML LL (SYRINGE) ×4 IMPLANT
SYR 20CC LL (SYRINGE) ×8 IMPLANT
SYR 50ML LL SCALE MARK (SYRINGE) ×4 IMPLANT
SYR CONTROL 10ML LL (SYRINGE) ×4 IMPLANT
TOWEL OR 17X26 10 PK STRL BLUE (TOWEL DISPOSABLE) ×4 IMPLANT
TRAY FOLEY METER SIL LF 16FR (CATHETERS) ×4 IMPLANT
TRAY FOLEY MTR SLVR 16FR STAT (SET/KITS/TRAYS/PACK) IMPLANT
TUBE CONNECTING VINYL 14FR 30C (TUBING) ×4 IMPLANT
TUBE FEEDING 8FR 16IN STR KANG (MISCELLANEOUS) ×4 IMPLANT
TUBING CONNECTING 10 (TUBING) ×6 IMPLANT
TUBING CONNECTING 10' (TUBING) ×2
WATER STERILE IRR 1000ML POUR (IV SOLUTION) IMPLANT
WATER STERILE IRR 3000ML UROMA (IV SOLUTION) IMPLANT

## 2018-02-04 NOTE — Transfer of Care (Signed)
Immediate Anesthesia Transfer of Care Note  Patient: Pamela Carney  Procedure(s) Performed: NEPHROLITHOTOMY PERCUTANEOUS (Left ) CYSTOSCOPY/URETEROSCOPY/RETROGRADE PYELOGRAM/HOLMIUM LASER/STENT PLACEMENT (Right )  Patient Location: PACU  Anesthesia Type:General  Level of Consciousness: sedated  Airway & Oxygen Therapy: Patient Spontanous Breathing and Patient connected to face mask oxygen  Post-op Assessment: Report given to RN and Post -op Vital signs reviewed and stable  Post vital signs: Reviewed and stable  Last Vitals:  Vitals Value Taken Time  BP    Temp    Pulse 86 02/04/2018  2:53 PM  Resp 13 02/04/2018  2:53 PM  SpO2 100 % 02/04/2018  2:53 PM  Vitals shown include unvalidated device data.  Last Pain:  Vitals:   02/04/18 0942  TempSrc:   PainSc: 4       Patients Stated Pain Goal: 5 (16/10/96 0454)  Complications: No apparent anesthesia complications

## 2018-02-04 NOTE — Anesthesia Procedure Notes (Signed)
Procedure Name: Intubation Date/Time: 02/04/2018 11:53 AM Performed by: Lind Covert, CRNA Pre-anesthesia Checklist: Patient identified, Emergency Drugs available, Patient being monitored, Timeout performed and Suction available Patient Re-evaluated:Patient Re-evaluated prior to induction Oxygen Delivery Method: Circle system utilized Preoxygenation: Pre-oxygenation with 100% oxygen Induction Type: IV induction Laryngoscope Size: Mac and 3 Grade View: Grade I Tube type: Oral Tube size: 7.0 mm Number of attempts: 1 Airway Equipment and Method: Stylet Placement Confirmation: ETT inserted through vocal cords under direct vision,  positive ETCO2 and breath sounds checked- equal and bilateral Secured at: 22 cm Tube secured with: Tape Dental Injury: Teeth and Oropharynx as per pre-operative assessment

## 2018-02-04 NOTE — H&P (Signed)
Pamela Carney is an 52 y.o. female.    Chief Complaint: Pre-OP 1st stage LEFT PCNL and RIGHT Ureteroscopy  HPI:   1 - Acute Renal Failure - Cr 5.1 up from baseline <1.5 by ER labs. Admits to supratheraputic NSAID use for migraines. Most recent Cr <1.   2 - Left > Right Bilateral Renal Stones - RLP 86mm non-obstrucing and left 39mm upper infundibulum (some upper pole hydro), 3cm LLP partial staghorn by CT 12/2017 on eval abdominal pain and hematuria. Per Dr. Zettie Pho recommendations, she has very large stones and will need staged bilateral combination left percutaneous / right retrograde surgeyr for goal of stone free in elective setting after clears infectious parameters. She did undergo left percutaneous nephrostomy tube placement in the left upper pole during her admission to allow for renal decompression in the setting of ARF with focal upper pole hydro/infection.    3 - Bacteruria - many bacteria on UA from ER 12/2017, some left upper pole focal obstruction from stone. WBC 20k. UCX 10/4 positive for E. Coli. Placed on empiric rocephin, discharged home on Keflex. Most recetn FU UCX negative.   4 - Hematuria - New hematuria 2019. Large bilateral stones as per above. She will get elective cysto as part of stone surgery to complete eval.    PMH sig for bipolar, migraine, morbid obesity. No PCP in years. Hgb A1C during hospital admission noted to be 11.3. She is scheduled to establish care with Dr. Jodi Mourning on 10/22.    Today "Pamela Carney" is seen to proceed with bilateral 1st stage surgery for large stones. Plan is PCNL on Left via existing neph tube access and Right ureteroscopy. NO interval fevers.    Past Medical History:  Diagnosis Date  . Abnormal Pap smear   . AKI (acute kidney injury) ([place]) 12/28/2017  . Anxiety   . Arthritis    bil knees, neck  . Bipolar 1 disorder ([place])   . Cataract    Mild  . Cholelithiasis 12/23/2017   noted on CT renal, pt unaware  . Chronic kidney  disease (CKD), stage III (moderate) (H[facility])   . Cystitis   . Genital warts    Hx of genital  . GERD (gastroesophageal reflux disease)   . Heart murmur    childhood  . Hematuria   . History of blood transfusion   . History of ectopic pregnancy   . History of gestational diabetes   . History of kidney stones   . History of sepsis    after ectopic pregnancy  . Hypertension   . Idiopathic peripheral neuropathy    both feet  . Leg ulcer, left ([place])   . Low back pain   . Migraines   . Obesity   . Oral candidiasis   . Pneumonia   . PONV (postoperative nausea and vomiting)    prolonged sedation  . Recurrent UTI   . 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 Sigmoid diverticulosis 12/23/2017   noted on CT renal, pt unaware  . Ulcer of foot ([place])    Left  . Weakness     Past Surgical History:  Procedure Laterality Date  . CERVICAL CONE BIOPSY    . CESAREAN SECTION     x3  . DILATION AND CURETTAGE OF UTERUS    . ECTOPIC PREGNANCY SURGERY    . IR NEPHROSTOMY PLACEMENT LEFT  12/25/2017  . OVARIAN CYST  REMOVAL    . UNILATERAL SALPINGECTOMY     Pt unsure of which fallopian tube was removed  . WISDOM TOOTH EXTRACTION      Family History  Problem Relation Age of Onset  . Diabetes Paternal Grandfather   . COPD Paternal Grandfather   . Heart disease Paternal Grandfather   . Cancer Paternal Grandfather        bone  . Cancer Paternal Grandmother   . Heart disease Paternal Grandmother   . Cancer Father   . Hypertension Father   . Heart attack Father   . Alcohol abuse Father   . Depression Father   . Heart failure Mother   . Diabetes Mother   . Heart disease Mother   . Hyperlipidemia Mother   . Hypertension Mother   . Heart attack Mother   . Peripheral vascular disease Mother   . Depression Mother   . COPD Mother   . Hypertension Sister   . Heart attack Sister   . Stroke Maternal  Grandmother    Social History:  reports that she has been smoking cigarettes. She has a 37.00 pack-year smoking history. She has never used smokeless tobacco. She reports that she does not drink alcohol or use drugs.  Allergies:  Allergies  Allergen Reactions  . Sulfa Antibiotics Hives and Swelling    Swelling site not recalled  . Latex Rash  . Tape Rash    Not tolerated well    No medications prior to admission.    No results found for this or any previous visit (from the past 48 hour(s)). No results found.  Review of Systems  Constitutional: Negative.  Negative for chills and fever.  HENT: Negative.   Eyes: Negative.   Respiratory: Negative.   Cardiovascular: Negative.   Gastrointestinal: Negative for nausea and vomiting.  Genitourinary: Positive for frequency and urgency.  Musculoskeletal: Negative.   Skin: Negative.   Neurological: Negative.   Endo/Heme/Allergies: Negative.   Psychiatric/Behavioral: Negative.     There were no vitals taken for this visit. Physical Exam  Constitutional: She appears well-developed.  HENT:  Head: Normocephalic.  Eyes: Pupils are equal, round, and reactive to light.  Neck: Normal range of motion.  Cardiovascular: Normal rate.  Respiratory: Effort normal.  GI:  Stable large truncal obesity.   Genitourinary:  Genitourinary Comments: Left neph tube in place with yellow urine that is non-foul.   Neurological: She is alert.  Skin: Skin is warm.  Psychiatric: She has a normal mood and affect.     Assessment/Plan  Proceed as planned with bilateral 1st stage stone surgeyr with goal of stone free. Risks, benefits, alternatives, expected peri-op course discussed previously and reiterated today.   Alexis Frock, MD 02/04/2018, 7:51 AM

## 2018-02-04 NOTE — Progress Notes (Signed)
Dr Sabra Heck at bedside, reviewed vital sign. Pt awake and alert, transfer to floor authorized.

## 2018-02-04 NOTE — Anesthesia Postprocedure Evaluation (Signed)
Anesthesia Post Note  Patient: Pamela Carney  Procedure(s) Performed: NEPHROLITHOTOMY PERCUTANEOUS (Left ) CYSTOSCOPY/URETEROSCOPY/RETROGRADE PYELOGRAM/HOLMIUM LASER/STENT PLACEMENT (Right )     Patient location during evaluation: PACU Anesthesia Type: General Level of consciousness: awake and alert Pain management: pain level controlled Vital Signs Assessment: post-procedure vital signs reviewed and stable Respiratory status: spontaneous breathing, nonlabored ventilation and respiratory function stable Cardiovascular status: blood pressure returned to baseline and stable Postop Assessment: no apparent nausea or vomiting Anesthetic complications: no    Last Vitals:  Vitals:   02/04/18 1600 02/04/18 1615  BP: (!) 92/53 90/65  Pulse: 69 74  Resp: 15 20  Temp:  36.6 C  SpO2: 95% 93%    Last Pain:  Vitals:   02/04/18 1615  TempSrc:   PainSc: 0-No pain                 Lynda Rainwater

## 2018-02-04 NOTE — Brief Op Note (Signed)
02/04/2018  2:42 PM  PATIENT:  Pamela Carney  52 y.o. female  PRE-OPERATIVE DIAGNOSIS:  LEFT GREATER THAN RIGHT RENAL STONES  POST-OPERATIVE DIAGNOSIS:  LEFT GREATER THAN RIGHT RENAL STONES  PROCEDURE:  Procedure(s) with comments: NEPHROLITHOTOMY PERCUTANEOUS (Left) - 3 HRS CYSTOSCOPY/URETEROSCOPY/RETROGRADE PYELOGRAM/HOLMIUM LASER/STENT PLACEMENT (Right)  SURGEON:  Surgeon(s) and Role:    Alexis Frock, MD - Primary  PHYSICIAN ASSISTANT:   ASSISTANTS: none   ANESTHESIA:   general  EBL:  minimal   BLOOD ADMINISTERED:none  DRAINS: 1 - left nephrostomy to gravity, 2 - left nephroureteral stent (capped), 3 - foley to gravity   LOCAL MEDICATIONS USED:  NONE  SPECIMEN:  Source of Specimen:  bilateral renal stone fragments  DISPOSITION OF SPECIMEN:  [facility] for compositional analysis  COUNTS:  YES  TOURNIQUET:  * No tourniquets in log *  DICTATION: .Other Dictation: Dictation Number  [id number]  PLAN OF CARE: Admit to inpatient   PATIENT DISPOSITION:  PACU - hemodynamically stable.   Delay start of Pharmacological VTE agent (>24hrs) due to surgical blood loss or risk of bleeding: yes

## 2018-02-04 NOTE — Anesthesia Preprocedure Evaluation (Signed)
Anesthesia Evaluation  Patient identified by MRN, date of birth, ID band Patient awake    Reviewed: Allergy & Precautions, NPO status , Patient's Chart, lab work & pertinent test results  History of Anesthesia Complications (+) PONV  Airway Mallampati: II  TM Distance: >3 FB Neck ROM: Full    Dental no notable dental hx.    Pulmonary neg pulmonary ROS, Current Smoker,    Pulmonary exam normal breath sounds clear to auscultation       Cardiovascular hypertension, negative cardio ROS Normal cardiovascular exam Rhythm:Regular Rate:Normal     Neuro/Psych  Headaches, Anxiety Bipolar Disorder negative psych ROS   GI/Hepatic Neg liver ROS, GERD  ,  Endo/Other  diabetesMorbid obesity  Renal/GU negative Renal ROS  negative genitourinary   Musculoskeletal  (+) Arthritis , Osteoarthritis,    Abdominal (+) + obese,   Peds negative pediatric ROS (+)  Hematology negative hematology ROS (+)   Anesthesia Other Findings   Reproductive/Obstetrics negative OB ROS                             Anesthesia Physical Anesthesia Plan  ASA: III  Anesthesia Plan: General   Post-op Pain Management:    Induction: Intravenous  PONV Risk Score and Plan: 3 and Ondansetron, Dexamethasone and Midazolam  Airway Management Planned: LMA and Oral ETT  Additional Equipment:   Intra-op Plan:   Post-operative Plan: Extubation in OR  Informed Consent: I have reviewed the patients History and Physical, chart, labs and discussed the procedure including the risks, benefits and alternatives for the proposed anesthesia with the patient or authorized representative who has indicated his/her understanding and acceptance.   Dental advisory given  Plan Discussed with: CRNA  Anesthesia Plan Comments:         Anesthesia Quick Evaluation

## 2018-02-05 ENCOUNTER — Encounter (HOSPITAL_COMMUNITY): Payer: Self-pay | Admitting: Urology

## 2018-02-05 LAB — BASIC METABOLIC PANEL
Anion gap: 9 (ref 5–15)
BUN: 16 mg/dL (ref 6–20)
CHLORIDE: 103 mmol/L (ref 98–111)
CO2: 26 mmol/L (ref 22–32)
Calcium: 9.8 mg/dL (ref 8.9–10.3)
Creatinine, Ser: 0.91 mg/dL (ref 0.44–1.00)
Glucose, Bld: 137 mg/dL — ABNORMAL HIGH (ref 70–99)
Potassium: 4.5 mmol/L (ref 3.5–5.1)
SODIUM: 138 mmol/L (ref 135–145)

## 2018-02-05 LAB — HEMOGLOBIN AND HEMATOCRIT, BLOOD
HEMATOCRIT: 40.6 % (ref 36.0–46.0)
Hemoglobin: 12.5 g/dL (ref 12.0–15.0)

## 2018-02-05 LAB — GLUCOSE, CAPILLARY
GLUCOSE-CAPILLARY: 221 mg/dL — AB (ref 70–99)
GLUCOSE-CAPILLARY: 146 mg/dL — AB (ref 70–99)
Glucose-Capillary: 134 mg/dL — ABNORMAL HIGH (ref 70–99)
Glucose-Capillary: 204 mg/dL — ABNORMAL HIGH (ref 70–99)

## 2018-02-05 MED ORDER — DARIFENACIN HYDROBROMIDE ER 15 MG PO TB24
15.0000 mg | ORAL_TABLET | Freq: Every day | ORAL | Status: DC
  Administered 2018-02-05 – 2018-02-08 (×4): 15 mg via ORAL
  Filled 2018-02-05 (×4): qty 1

## 2018-02-05 NOTE — Plan of Care (Signed)
Pt's questions answered regarding insulin needs. Pt using incentive spirometer regularly. Pt's appetite was good this shift.

## 2018-02-05 NOTE — Progress Notes (Addendum)
Patient ID: Pamela Carney, female   DOB: [birth year], 52 y.o.   MRN: [id number] 1 Day Post-Op  Assessment: Bilateral renal calculi: She underwent first stage of a staged left PCNL yesterday as well as a right ureteroscopy.  Her nephrostomy tube is draining with excellent output and is nearly clear.  Her urine is also clear in her Foley catheter.  Her hemoglobin remained stable.  She is not having any flank pain but seems to be bothered by some stent pain in her bladder.  We discussed possibly removing her catheter but she is elected to maintain catheter drainage for now.  Plan:  1.  Continue Foley catheter drainage with planned second stage left PCNL on 11/18. 2.  Begin Enablex 15 mg  Subjective: Patient reports no significant flank pain.  Her greatest complaint is that of intermittent pain in the bladder with an associated sensation of urgency.  She has a Foley catheter in.  Objective: Vital signs in last 24 hours: Temp:  [97.6 F (36.4 C)-99.1 F (37.3 C)] 98.2 F (36.8 C) (11/16 0552) Pulse Rate:  [69-98] 72 (11/16 0552) Resp:  [13-20] 16 (11/16 0552) BP: (85-141)/(52-98) 141/98 (11/16 0552) SpO2:  [91 %-100 %] 96 % (11/16 0552) Weight:  [103 kg-106.5 kg] 106.5 kg (11/15 1700)  Intake/Output from previous day: 11/15 0701 - 11/16 0700 In: 1720 [P.O.:120; I.V.:1600] Out: 1890 [Urine:1890] Intake/Output this shift: No intake/output data recorded.  Past Medical History:  Diagnosis Date  . Abnormal Pap smear   . AKI (acute kidney injury) ([place]) 12/28/2017  . Anxiety   . Arthritis    bil knees, neck  . Bipolar 1 disorder ([place])   . Cataract    Mild  . Cholelithiasis 12/23/2017   noted on CT renal, pt unaware  . Chronic kidney disease (CKD), stage III (moderate) ([facility])   . Cystitis   . Genital warts    Hx of genital  . GERD (gastroesophageal reflux disease)   . Heart murmur    childhood  . Hematuria   . History of blood transfusion   . History of ectopic pregnancy   .  History of gestational diabetes   . History of kidney stones   . History of sepsis    after ectopic pregnancy  . Hypertension   . Idiopathic peripheral neuropathy    both feet  . Leg ulcer, left ([place])   . Low back pain   . Migraines   . Obesity   . Oral candidiasis   . Pneumonia   . PONV (postoperative nausea and vomiting)    prolonged sedation  . Recurrent UTI   . 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 Sigmoid diverticulosis 12/23/2017   noted on CT renal, pt unaware  . Ulcer of foot ([place])    Left  . Weakness    Current Facility-Administered Medications  Medication Dose Route Frequency Provider Last Rate Last Dose  . 0.9 %  sodium chloride infusion   Intravenous Continuous Alexis Frock, MD 50 mL/hr at 02/04/18 1827    . acetaminophen (TYLENOL) tablet 1,000 mg  1,000 mg Oral Marella Chimes, MD   1,000 mg at 02/05/18 0044  . feeding supplement (ENSURE ENLIVE) (ENSURE ENLIVE) liquid 237 mL  237 mL Oral BID BM Alexis Frock, MD      . HYDROmorphone (DILAUDID) injection 0.5 mg  0.5 mg Intravenous Q2H PRN Alexis Frock, MD   0.5  mg at 02/05/18 0610  . Influenza vac split quadrivalent PF (FLUARIX) injection 0.5 mL  0.5 mL Intramuscular Tomorrow-1000 Alexis Frock, MD      . insulin aspart (novoLOG) injection 0-15 Units  0-15 Units Subcutaneous TID WC Alexis Frock, MD   5 Units at 02/04/18 1830  . insulin aspart (novoLOG) injection 0-5 Units  0-5 Units Subcutaneous QHS Alexis Frock, MD   2 Units at 02/04/18 2332  . insulin aspart (novoLOG) injection 4 Units  4 Units Subcutaneous TID WC Alexis Frock, MD      . oxyCODONE (Oxy IR/ROXICODONE) immediate release tablet 5 mg  5 mg Oral Q4H PRN Alexis Frock, MD      . pneumococcal 23 valent vaccine (PNU-IMMUNE) injection 0.5 mL  0.5 mL Intramuscular Tomorrow-1000 Alexis Frock, MD        Physical Exam:  General: Patient is in no  apparent distress Lungs: Normal respiratory effort, chest expands symmetrically. GI: The abdomen is soft and nontender without mass. Back: Her percutaneous nephrostomy tube dressing is dry and intact.  Her tube is draining nearly clear urine. GU: Foley catheter draining clear urine.    Lab Results: Recent Labs    02/04/18 1522 02/05/18 0437  HGB 12.7 12.5  HCT 41.5 40.6   BMET Recent Labs    02/05/18 0437  NA 138  K 4.5  CL 103  CO2 26  GLUCOSE 137*  BUN 16  CREATININE 0.91  CALCIUM 9.8   No results for input(s): LABPT, INR in the last 72 hours. No results for input(s): LABURIN in the last 72 hours. Results for orders placed or performed during the hospital encounter of 12/23/17  Urine culture     Status: Abnormal   Collection Time: 12/23/17  9:28 PM  Result Value Ref Range Status   Specimen Description URINE, RANDOM  Final   Special Requests   Final    NONE Performed at [facility], [place] [address], [place], [place] [address]    Culture >=100,000 COLONIES/mL ESCHERICHIA COLI (A)  Final   Report Status 12/26/2017 FINAL  Final   Organism ID, Bacteria ESCHERICHIA COLI (A)  Final      Susceptibility   Escherichia coli - MIC*    AMPICILLIN >=32 RESISTANT Resistant     CEFAZOLIN <=4 SENSITIVE Sensitive     CEFTRIAXONE <=1 SENSITIVE Sensitive     CIPROFLOXACIN >=4 RESISTANT Resistant     GENTAMICIN <=1 SENSITIVE Sensitive     IMIPENEM <=0.25 SENSITIVE Sensitive     NITROFURANTOIN <=16 SENSITIVE Sensitive     TRIMETH/SULFA <=20 SENSITIVE Sensitive     AMPICILLIN/SULBACTAM 16 INTERMEDIATE Intermediate     PIP/TAZO <=4 SENSITIVE Sensitive     Extended ESBL NEGATIVE Sensitive     * >=100,000 COLONIES/mL ESCHERICHIA COLI  Culture, blood (Routine x 2)     Status: None   Collection Time: 12/23/17  9:46 PM  Result Value Ref Range Status   Specimen Description BLOOD BLOOD RIGHT FOREARM  Final   Special Requests   Final    BOTTLES DRAWN AEROBIC AND ANAEROBIC Blood  Culture results may not be optimal due to an inadequate volume of blood received in culture bottles   Culture   Final    NO GROWTH 5 DAYS Performed at [facility] [address] [place], [place] [address]    Report Status 12/28/2017 FINAL  Final  Culture, blood (Routine x 2)     Status: None   Collection Time: 12/23/17 10:55  PM  Result Value Ref Range Status   Specimen Description BLOOD RIGHT HAND  Final   Special Requests   Final    BOTTLES DRAWN AEROBIC ONLY Blood Culture results may not be optimal due to an inadequate volume of blood received in culture bottles   Culture   Final    NO GROWTH 5 DAYS Performed at [facility], [place] [address] [place], [place] [address]    Report Status 12/29/2017 FINAL  Final  Blood culture (routine x 2)     Status: None   Collection Time: 12/24/17  5:00 AM  Result Value Ref Range Status   Specimen Description BLOOD LEFT ARM  Final   Special Requests   Final    BOTTLES DRAWN AEROBIC ONLY Blood Culture results may not be optimal due to an inadequate volume of blood received in culture bottles   Culture   Final    NO GROWTH 5 DAYS Performed at [facility], [place] [address] [place], [place] [address]    Report Status 12/29/2017 FINAL  Final  MRSA PCR Screening     Status: None   Collection Time: 12/24/17  3:10 PM  Result Value Ref Range Status   MRSA by PCR NEGATIVE NEGATIVE Final    Comment:        The GeneXpert MRSA Assay (FDA approved for NASAL specimens only), is one component of a comprehensive MRSA colonization surveillance program. It is not intended to diagnose MRSA infection nor to guide or monitor treatment for MRSA infections. Performed at [facility], [place] [address], [place], [place] [address]   Urine Culture     Status: Abnormal   Collection Time: 12/25/17  9:44 AM  Result Value Ref Range Status   Specimen Description KIDNEY LEFT NEPHROSTOMY  Final   Special Requests   Final    NONE Performed at  [facility], [place] [address], [place], [place] [address]    Culture >=100,000 COLONIES/mL ESCHERICHIA COLI (A)  Final   Report Status 12/28/2017 FINAL  Final   Organism ID, Bacteria ESCHERICHIA COLI (A)  Final      Susceptibility   Escherichia coli - MIC*    AMPICILLIN >=32 RESISTANT Resistant     CEFAZOLIN <=4 SENSITIVE Sensitive     CEFEPIME <=1 SENSITIVE Sensitive     CEFTAZIDIME <=1 SENSITIVE Sensitive     CEFTRIAXONE <=1 SENSITIVE Sensitive     CIPROFLOXACIN >=4 RESISTANT Resistant     GENTAMICIN <=1 SENSITIVE Sensitive     IMIPENEM <=0.25 SENSITIVE Sensitive     TRIMETH/SULFA <=20 SENSITIVE Sensitive     AMPICILLIN/SULBACTAM 16 INTERMEDIATE Intermediate     PIP/TAZO <=4 SENSITIVE Sensitive     Extended ESBL NEGATIVE Sensitive     * >=100,000 COLONIES/mL ESCHERICHIA COLI    Studies/Results: Dg C-arm 1-60 Min-no Report  Result Date: 02/04/2018 Fluoroscopy was utilized by the requesting physician.  No radiographic interpretation.   Dg C-arm 1-60 Min-no Report  Result Date: 02/04/2018 Fluoroscopy was utilized by the requesting physician.  No radiographic interpretation.       Bereket Gernert C 02/05/2018, 7:17 AM

## 2018-02-05 NOTE — Op Note (Signed)
NAME: Pamela Carney, Pamela Carney MEDICAL RECORD [id number] ACCOUNT [id number] DATE OF BIRTH:Oct 25, 1965 FACILITY: [facility] LOCATION: [id number] PHYSICIAN:Millisa Giarrusso, MD  OPERATIVE REPORT  DATE OF PROCEDURE:  02/04/2018  PREOPERATIVE DIAGNOSIS:  Left partial staghorn right renal stones, history of urosepsis.  PROCEDURE: 1.  Cystoscopy, right retrograde pyelogram, interpretation. 2.  Right ureteroscopy with laser lithotripsy and exchange of right ureteral stent 6 x 24 Contour, no tether. 3.  Left first-stage percutaneous nephrostolithotomy, stone greater than 2 cm. 4.  Left antegrade nephrostogram with interpretation. 5.  Placement of left nephrostomy tube.  ASSESSMENT:  Bilateral renal stone fragments.  FINDINGS: 1.  Right lower pole renal stone.  Total volume estimated 1 cm. 2.  Very large multifocal, mostly mid and lower pole, left renal stone, volume at least 3 cm. 3.  Complete resolution of all accessible stone fragments larger than 130 mm within the right kidney and ureter following laser lithotripsy and basket extraction. 4.  Successful removal of all accessible stone fragments by rigid nephroscopy technique on the left side. 5.  Successful placement of right ureteral stent proximally in the renal pelvis, distally in the urinary bladder. 6.  Successful replacement of left nephrostomy tube  in the renal pelvis. 7.  Successful placement of left nephroureteral stent, capped.  INDICATIONS:  The patient is a quite obese and comorbid 52 year old lady who was found on workup of acute renal failure and sepsis to have a left partial staghorn stone right intrarenal stone.  She underwent a left nephrostomy tube, intravenous  antibiotics, and has since cleared her infectious parameters.  She is a poorly compliant diabetic.  She is now on treatment for her diabetes.  She presents for definitive management of her stones with a bilateral staged approach, first stage today.  Most  recent urinary culture  is negative.  PROCEDURE IN DETAIL:  The patient being identified, the procedure being right ureteroscopic stimulation and left first-stage percutaneous nephrostolithotomy was confirmed.  Procedure timeout was performed.  Intravenous antibiotics administered.  General  endotracheal anesthesia was induced.  The patient was placed into a low lithotomy position.  A sterile field was created, prepping and draping the patient's vagina, introitus and proximal thighs using iodine.  Cystourethroscopy was performed with a  22-French rigid cystoscope offset lens.  Inspection of the urinary bladder with no diverticula, calcifications, or papillary lesions.  The right ureteral orifice was cannulated with a 6-French renal catheter and right retrograde pyelogram was obtained.  Right retrograde pyelogram demonstrated a single right ureter, single-system right kidney.  There was somewhat high insertion noted.  There was no significant hydronephrosis noted.  A 0.038 ZIPwire was advanced to the lower pole and set aside as a safety  wire.  An 8-French feeding tube was placed in the urinary bladder for pressure release, and a semirigid ureteroscopy was performed of the distal 4/5 of the right ureter alongside a separate sensor working wire.  No mucosal abnormalities were found.  The  semirigid scope was then exchanged for a 12/14, 25 cm ureteral access sheath to the level of the proximal ureter using continuous fluoroscopic guidance.  Flexible digital ureteroscopy was performed in the proximal right ureter and systematic inspection  of the right kidney, including all calices x3.  There was somewhat high insertion noted again at the UPJ without obvious obstruction.  There were multifocal right lower pole renal stones.  These were in acutely angled calix.  These were much too large  for simple basketing.  They were grasped in an escape basket  and repositioned into an upper pole calix to allow for less acute angulation, and  holmium laser energy was applied at 70 setting of 0.3 joules and 30 Hz.  Approximately 50% of the stone volume  was dusted.  The remaining 50% were fragmented into pieces 1-2 mm, which were then sequentially grasped with an escape basket and brought out and set aside.  Following this, there was complete resolution of all accessible stone fragments larger than 130  mm.  The access sheath was removed under continuous vision.  No mucosal abnormalities were found, and a new 6 x 24 Contour-type stent was placed over the safety wire using fluoroscopic guidance.  Good proximal and distal points were noted.  A Foley  catheter was then placed free to straight drain.  The patient was repositioned into a prone position, employing 10 degrees of table flexion to maximize the area between the 12th rib and the iliac crest.  ProneView axillary rolls and sequential  compression devices verified.  She was further fastened to the operative table using tape over foam padding across her supraxiphoid chest and her pelvis.  The in situ left nephrostomy tube was capped.  A sterile field was created by prepping and draping  the patient's entire left flank including nephrostomy tube using chlorhexidine gluconate, and a percutaneous drape was applied.  Initial antegrade nephrostogram was then performed.  Antegrade nephrostogram revealed placement of the left nephrostomy tube into the lower mid calix as anticipated.  No evidence of extravasation.  A 0.038 ZIPwire was advanced via the nephrostomy tube tract to the level of the urinary bladder and exchanged  for a Super Stiff wire via the KMP catheter.  Next, using a dual axial introducer, access was achieved over the Super Stiff wire to the proximal ureter, and a second Super Stiff wire was placed via exchange of the KMP catheter.  Having obtained dual  Super Stiff wire access to the level of the urinary bladder, an incision was made approximately 5 cm in the flank.  The perirenal  fascia was dilated, fluoroscopically guided.  A hemostat and the NephroMax balloon dilation apparatus were carefully  advanced across the lower pole calix, inflated to a pressure of 20 atmospheres, held for 90 seconds, and the sheath was applied across this.  Rigid nephroscopy was then performed.  Rigid nephroscopy revealed excellent sheath placement into the lower mid  area as anticipated.  Some stone was immediately visible and amenable to basketing with forceps.  These were carefully removed.  There were 2 rather large foci of lower pole stone that required lithotripsy using the [facility] ultrasound pneumatic  device, taking exquisite care to avoid any renal perforation, which did not occur.  These 2 foci were controlled using this technique.  Flexible nephroscopy was then performed, and there was complete resolution of all accessible calices of stone with  the sheath in place.  The UPJ was inspected and found to be uninjured.  We achieved the goals of his left first-stage procedure today.  A new 14-French nephrostomy tube was then placed over one of the Super Stiff working wires to the level of the renal  pelvis and deployed.  A second Super Stiff wire was used over which the KMP catheter was advanced to lower urinary bladder, acting as an antegrade nephroureteral stent.  This was capped.  A percutaneous dressing was applied after the nephrostomy tube and  nephroureteral stent were sutured in place using interrupted silk, and the procedure was then terminated.  The patient tolerated the procedure well.  No immediate perioperative complications.  The patient was taken to postanesthesia care in stable  condition with plan for hospital admission and second-stage procedure on the 18th as planned on the left side.  LN/NUANCE  D:02/04/2018 T:02/05/2018 JOB:[id number]

## 2018-02-05 NOTE — Plan of Care (Signed)
Pt had  good OP this shift. Her appetite was good and she was calm and cooperative.

## 2018-02-06 LAB — GLUCOSE, CAPILLARY
GLUCOSE-CAPILLARY: 134 mg/dL — AB (ref 70–99)
GLUCOSE-CAPILLARY: 120 mg/dL — AB (ref 70–99)
Glucose-Capillary: 143 mg/dL — ABNORMAL HIGH (ref 70–99)
Glucose-Capillary: 170 mg/dL — ABNORMAL HIGH (ref 70–99)

## 2018-02-06 MED ORDER — GENTAMICIN SULFATE 40 MG/ML IJ SOLN
5.0000 mg/kg | INTRAVENOUS | Status: AC
  Administered 2018-02-07: 380 mg via INTRAVENOUS
  Filled 2018-02-06: qty 9.5

## 2018-02-06 MED ORDER — CEFAZOLIN SODIUM-DEXTROSE 1-4 GM/50ML-% IV SOLN
1.0000 g | Freq: Three times a day (TID) | INTRAVENOUS | Status: DC
  Administered 2018-02-06 – 2018-02-07 (×3): 1 g via INTRAVENOUS
  Filled 2018-02-06 (×4): qty 50

## 2018-02-06 MED ORDER — CEFAZOLIN SODIUM-DEXTROSE 2-4 GM/100ML-% IV SOLN
2.0000 g | INTRAVENOUS | Status: AC
  Administered 2018-02-07: 2 g via INTRAVENOUS
  Filled 2018-02-06 (×2): qty 100

## 2018-02-06 NOTE — Progress Notes (Addendum)
Patient ID: Pamela Carney, female   DOB: 03/19/1966, 52 y.o.   MRN: [id number] 2 Days Post-Op  Assessment: Obstructing left renal calculi: She is doing well with no complaint of flank pain.  She has excellent output from her nephrostomy tube in her urine is completely clear.  Urine culture proved positive for E. coli.  It was sensitive to Ancef and therefore I have started her on antibiotics today.  She remains afebrile.  She is scheduled for second stage PCNL tomorrow.  Plan:  1.  N.p.o. after midnight. 2.  Begin Ancef 3.  Routine preop orders placed. 4.  Second stage PCNL tomorrow.  Subjective: Patient complains only of continued sensations of urgency and needing to urinate despite having a Foley catheter in.  She otherwise is tolerating her nephrostomy tube.  Objective: Vital signs in last 24 hours: Temp:  [97.5 F (36.4 C)-98.3 F (36.8 C)] 97.8 F (36.6 C) (11/17 0520) Pulse Rate:  [55-71] 68 (11/17 0520) Resp:  [12-16] 16 (11/17 0520) BP: (107-139)/(62-87) 139/87 (11/17 0520) SpO2:  [94 %-97 %] 94 % (11/17 0520)  Intake/Output from previous day: 11/16 0701 - 11/17 0700 In: 905.2 [P.O.:240; I.V.:665.2] Out: 2950 [Urine:2950] Intake/Output this shift: No intake/output data recorded.  Past Medical History:  Diagnosis Date  . Abnormal Pap smear   . AKI (acute kidney injury) ([place]) 12/28/2017  . Anxiety   . Arthritis    bil knees, neck  . Bipolar 1 disorder ([place])   . Cataract    Mild  . Cholelithiasis 12/23/2017   noted on CT renal, pt unaware  . Chronic kidney disease (CKD), stage III (moderate) ([facility])   . Cystitis   . Genital warts    Hx of genital  . GERD (gastroesophageal reflux disease)   . Heart murmur    childhood  . Hematuria   . History of blood transfusion   . History of ectopic pregnancy   . History of gestational diabetes   . History of kidney stones   . History of sepsis    after ectopic pregnancy  . Hypertension   . Idiopathic peripheral  neuropathy    both feet  . Leg ulcer, left ([place])   . Low back pain   . Migraines   . Obesity   . Oral candidiasis   . Pneumonia   . PONV (postoperative nausea and vomiting)    prolonged sedation  . Recurrent UTI   . 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 Sigmoid diverticulosis 12/23/2017   noted on CT renal, pt unaware  . Ulcer of foot ([place])    Left  . Weakness    Current Facility-Administered Medications  Medication Dose Route Frequency Provider Last Rate Last Dose  . 0.9 %  sodium chloride infusion   Intravenous Continuous Alexis Frock, MD   Stopped at 02/05/18 1918  . acetaminophen (TYLENOL) tablet 1,000 mg  1,000 mg Oral Q8H Alexis Frock, MD   1,000 mg at 02/06/18 0032  . darifenacin (ENABLEX) 24 hr tablet 15 mg  15 mg Oral Daily Kathie Rhodes, MD   15 mg at 02/05/18 1135  . feeding supplement (ENSURE ENLIVE) (ENSURE ENLIVE) liquid 237 mL  237 mL Oral BID BM Alexis Frock, MD   237 mL at 02/05/18 1936  . HYDROmorphone (DILAUDID) injection 0.5 mg  0.5 mg Intravenous Q2H PRN Alexis Frock, MD   0.5 mg at 02/05/18 0610  .  insulin aspart (novoLOG) injection 0-15 Units  0-15 Units Subcutaneous TID WC Alexis Frock, MD   2 Units at 02/05/18 1745  . insulin aspart (novoLOG) injection 0-5 Units  0-5 Units Subcutaneous QHS Alexis Frock, MD   2 Units at 02/05/18 2150  . insulin aspart (novoLOG) injection 4 Units  4 Units Subcutaneous TID WC Alexis Frock, MD   4 Units at 02/05/18 1741  . oxyCODONE (Oxy IR/ROXICODONE) immediate release tablet 5 mg  5 mg Oral Q4H PRN Alexis Frock, MD   5 mg at 02/06/18 5009    Physical Exam:  General: Patient is in no apparent distress Lungs: Normal respiratory effort, chest expands symmetrically. GI: The abdomen is soft and nontender without mass.  Back: Nephrostomy tube in place and draining clear urine.    Lab Results: Recent Labs     02/04/18 1522 02/05/18 0437  HGB 12.7 12.5  HCT 41.5 40.6   BMET Recent Labs    02/05/18 0437  NA 138  K 4.5  CL 103  CO2 26  GLUCOSE 137*  BUN 16  CREATININE 0.91  CALCIUM 9.8   No results for input(s): LABPT, INR in the last 72 hours. No results for input(s): LABURIN in the last 72 hours. Results for orders placed or performed during the hospital encounter of 12/23/17  Urine culture     Status: Abnormal   Collection Time: 12/23/17  9:28 PM  Result Value Ref Range Status   Specimen Description URINE, RANDOM  Final   Special Requests   Final    NONE Performed at [facility], [place] [address] [place], [place] [address]    Culture >=100,000 COLONIES/mL ESCHERICHIA COLI (A)  Final   Report Status 12/26/2017 FINAL  Final   Organism ID, Bacteria ESCHERICHIA COLI (A)  Final      Susceptibility   Escherichia coli - MIC*    AMPICILLIN >=32 RESISTANT Resistant     CEFAZOLIN <=4 SENSITIVE Sensitive     CEFTRIAXONE <=1 SENSITIVE Sensitive     CIPROFLOXACIN >=4 RESISTANT Resistant     GENTAMICIN <=1 SENSITIVE Sensitive     IMIPENEM <=0.25 SENSITIVE Sensitive     NITROFURANTOIN <=16 SENSITIVE Sensitive     TRIMETH/SULFA <=20 SENSITIVE Sensitive     AMPICILLIN/SULBACTAM 16 INTERMEDIATE Intermediate     PIP/TAZO <=4 SENSITIVE Sensitive     Extended ESBL NEGATIVE Sensitive     * >=100,000 COLONIES/mL ESCHERICHIA COLI  Culture, blood (Routine x 2)     Status: None   Collection Time: 12/23/17  9:46 PM  Result Value Ref Range Status   Specimen Description BLOOD BLOOD RIGHT FOREARM  Final   Special Requests   Final    BOTTLES DRAWN AEROBIC AND ANAEROBIC Blood Culture results may not be optimal due to an inadequate volume of blood received in culture bottles   Culture   Final    NO GROWTH 5 DAYS Performed at [facility] [address] [place], [place] [address]    Report Status 12/28/2017 FINAL  Final  Culture, blood (Routine x 2)     Status: None   Collection  Time: 12/23/17 10:55 PM  Result Value Ref Range Status   Specimen Description BLOOD RIGHT HAND  Final   Special Requests   Final    BOTTLES DRAWN AEROBIC ONLY Blood Culture results may not be optimal due to an inadequate volume of blood received in culture bottles   Culture   Final    NO GROWTH  5 DAYS Performed at [facility], [place] [address], [place], [place] [id number]    Report Status 12/29/2017 FINAL  Final  Blood culture (routine x 2)     Status: None   Collection Time: 12/24/17  5:00 AM  Result Value Ref Range Status   Specimen Description BLOOD LEFT ARM  Final   Special Requests   Final    BOTTLES DRAWN AEROBIC ONLY Blood Culture results may not be optimal due to an inadequate volume of blood received in culture bottles   Culture   Final    NO GROWTH 5 DAYS Performed at [facility], [place] [address] [place], [place] [address]    Report Status 12/29/2017 FINAL  Final  MRSA PCR Screening     Status: None   Collection Time: 12/24/17  3:10 PM  Result Value Ref Range Status   MRSA by PCR NEGATIVE NEGATIVE Final    Comment:        The GeneXpert MRSA Assay (FDA approved for NASAL specimens only), is one component of a comprehensive MRSA colonization surveillance program. It is not intended to diagnose MRSA infection nor to guide or monitor treatment for MRSA infections. Performed at [facility], [place] [address], [place], [place] [address]   Urine Culture     Status: Abnormal   Collection Time: 12/25/17  9:44 AM  Result Value Ref Range Status   Specimen Description KIDNEY LEFT NEPHROSTOMY  Final   Special Requests   Final    NONE Performed at [facility], [place] [address], [place], [place] [address]    Culture >=100,000 COLONIES/mL ESCHERICHIA COLI (A)  Final   Report Status 12/28/2017 FINAL  Final   Organism ID, Bacteria ESCHERICHIA COLI (A)  Final      Susceptibility   Escherichia coli - MIC*    AMPICILLIN >=32 RESISTANT Resistant      CEFAZOLIN <=4 SENSITIVE Sensitive     CEFEPIME <=1 SENSITIVE Sensitive     CEFTAZIDIME <=1 SENSITIVE Sensitive     CEFTRIAXONE <=1 SENSITIVE Sensitive     CIPROFLOXACIN >=4 RESISTANT Resistant     GENTAMICIN <=1 SENSITIVE Sensitive     IMIPENEM <=0.25 SENSITIVE Sensitive     TRIMETH/SULFA <=20 SENSITIVE Sensitive     AMPICILLIN/SULBACTAM 16 INTERMEDIATE Intermediate     PIP/TAZO <=4 SENSITIVE Sensitive     Extended ESBL NEGATIVE Sensitive     * >=100,000 COLONIES/mL ESCHERICHIA COLI    Studies/Results: No results found.     Altheria Shadoan C 02/06/2018, 8:14 AM

## 2018-02-06 NOTE — Progress Notes (Signed)
Nutrition Brief Note  Patient identified on the Malnutrition Screening Tool (MST) Report  No weight loss over the past year per weight records.   Wt Readings from Last 15 Encounters:  02/04/18 106.5 kg  02/01/18 103 kg  01/28/18 105.8 kg  12/26/17 104 kg  01/31/14 118.8 kg  10/05/13 117 kg  08/20/13 113.4 kg  12/28/12 114.3 kg  12/27/12 114.3 kg    Body mass index is 42.26 kg/m. Patient meets criteria for morbid obesity based on current BMI.   Current diet order is CHO modified.  Labs and medications reviewed.   No nutrition interventions warranted at this time. If nutrition issues arise, please consult RD.   Clayton Bibles, MS, RD, [place] Dietitian Pager: [phone] After Hours Pager: [phone]

## 2018-02-07 ENCOUNTER — Inpatient Hospital Stay (HOSPITAL_COMMUNITY): Payer: 59

## 2018-02-07 ENCOUNTER — Encounter (HOSPITAL_COMMUNITY)
Admission: RE | Disposition: A | Discharge status: Home / Self Care | Payer: Self-pay | Source: Ambulatory Visit | Attending: Urology

## 2018-02-07 ENCOUNTER — Inpatient Hospital Stay (HOSPITAL_COMMUNITY): Admission: RE | Admit: 2018-02-07 | Payer: 59 | Source: Ambulatory Visit | Admitting: Urology

## 2018-02-07 ENCOUNTER — Inpatient Hospital Stay (HOSPITAL_COMMUNITY): Payer: 59 | Admitting: Anesthesiology

## 2018-02-07 ENCOUNTER — Encounter (HOSPITAL_COMMUNITY): Payer: Self-pay | Admitting: Anesthesiology

## 2018-02-07 HISTORY — PX: CYSTOSCOPY/URETEROSCOPY/HOLMIUM LASER/STENT PLACEMENT: SHX6546

## 2018-02-07 HISTORY — PX: NEPHROLITHOTOMY: SHX5134

## 2018-02-07 LAB — GLUCOSE, CAPILLARY
GLUCOSE-CAPILLARY: 122 mg/dL — AB (ref 70–99)
GLUCOSE-CAPILLARY: 127 mg/dL — AB (ref 70–99)
GLUCOSE-CAPILLARY: 257 mg/dL — AB (ref 70–99)
Glucose-Capillary: 176 mg/dL — ABNORMAL HIGH (ref 70–99)

## 2018-02-07 SURGERY — NEPHROLITHOTOMY PERCUTANEOUS
Anesthesia: General | Site: Ureter | Laterality: Right

## 2018-02-07 MED ORDER — ESMOLOL HCL 100 MG/10ML IV SOLN
INTRAVENOUS | Status: DC | PRN
  Administered 2018-02-07: 20 mg via INTRAVENOUS

## 2018-02-07 MED ORDER — LIDOCAINE 2% (20 MG/ML) 5 ML SYRINGE
INTRAMUSCULAR | Status: DC | PRN
  Administered 2018-02-07: 100 mg via INTRAVENOUS

## 2018-02-07 MED ORDER — SODIUM CHLORIDE 0.9 % IV SOLN
INTRAVENOUS | Status: DC | PRN
  Administered 2018-02-07: 16:00:00

## 2018-02-07 MED ORDER — HYDROMORPHONE HCL 1 MG/ML IJ SOLN: 0.2500 mg | INTRAMUSCULAR | Status: DC | PRN

## 2018-02-07 MED ORDER — MIDAZOLAM HCL 5 MG/5ML IJ SOLN
INTRAMUSCULAR | Status: DC | PRN
  Administered 2018-02-07: 2 mg via INTRAVENOUS

## 2018-02-07 MED ORDER — PHENYLEPHRINE HCL 10 MG/ML IJ SOLN
INTRAMUSCULAR | Status: AC
  Filled 2018-02-07: qty 1

## 2018-02-07 MED ORDER — PHENYLEPHRINE 40 MCG/ML (10ML) SYRINGE FOR IV PUSH (FOR BLOOD PRESSURE SUPPORT)
PREFILLED_SYRINGE | INTRAVENOUS | Status: AC
  Filled 2018-02-07: qty 10

## 2018-02-07 MED ORDER — CEPHALEXIN 500 MG PO CAPS: 500.0000 mg | ORAL_CAPSULE | Freq: Two times a day (BID) | ORAL | 0 refills | Status: DC

## 2018-02-07 MED ORDER — SODIUM CHLORIDE 0.9 % IR SOLN
Status: DC | PRN
  Administered 2018-02-07: 1000 mL
  Administered 2018-02-07: 6000 mL

## 2018-02-07 MED ORDER — SUGAMMADEX SODIUM 500 MG/5ML IV SOLN
INTRAVENOUS | Status: DC | PRN
  Administered 2018-02-07: 250 mg via INTRAVENOUS

## 2018-02-07 MED ORDER — INDIGOTINDISULFONATE SODIUM 8 MG/ML IJ SOLN
INTRAMUSCULAR | Status: AC
  Filled 2018-02-07: qty 5

## 2018-02-07 MED ORDER — FENTANYL CITRATE (PF) 100 MCG/2ML IJ SOLN
INTRAMUSCULAR | Status: DC | PRN
  Administered 2018-02-07: 100 ug via INTRAVENOUS
  Administered 2018-02-07 (×3): 50 ug via INTRAVENOUS

## 2018-02-07 MED ORDER — SUGAMMADEX SODIUM 500 MG/5ML IV SOLN
INTRAVENOUS | Status: AC
  Filled 2018-02-07: qty 5

## 2018-02-07 MED ORDER — IOPAMIDOL (ISOVUE-300) INJECTION 61%
INTRAVENOUS | Status: AC
  Filled 2018-02-07: qty 50

## 2018-02-07 MED ORDER — PHENYLEPHRINE 40 MCG/ML (10ML) SYRINGE FOR IV PUSH (FOR BLOOD PRESSURE SUPPORT)
PREFILLED_SYRINGE | INTRAVENOUS | Status: DC | PRN
  Administered 2018-02-07: 80 ug via INTRAVENOUS
  Administered 2018-02-07: 120 ug via INTRAVENOUS
  Administered 2018-02-07 (×2): 80 ug via INTRAVENOUS

## 2018-02-07 MED ORDER — GLYCOPYRROLATE PF 0.2 MG/ML IJ SOSY
PREFILLED_SYRINGE | INTRAMUSCULAR | Status: DC | PRN
  Administered 2018-02-07: .2 mg via INTRAVENOUS

## 2018-02-07 MED ORDER — PROPOFOL 10 MG/ML IV BOLUS
INTRAVENOUS | Status: DC | PRN
  Administered 2018-02-07: 170 mg via INTRAVENOUS

## 2018-02-07 MED ORDER — LACTATED RINGERS IV SOLN
INTRAVENOUS | Status: DC
  Administered 2018-02-07: 15:00:00 via INTRAVENOUS

## 2018-02-07 MED ORDER — PROMETHAZINE HCL 25 MG/ML IJ SOLN: 6.2500 mg | INTRAMUSCULAR | Status: DC | PRN

## 2018-02-07 MED ORDER — ROCURONIUM BROMIDE 10 MG/ML (PF) SYRINGE
PREFILLED_SYRINGE | INTRAVENOUS | Status: DC | PRN
  Administered 2018-02-07: 50 mg via INTRAVENOUS
  Administered 2018-02-07: 10 mg via INTRAVENOUS

## 2018-02-07 MED ORDER — DEXAMETHASONE SODIUM PHOSPHATE 10 MG/ML IJ SOLN
INTRAMUSCULAR | Status: DC | PRN
  Administered 2018-02-07: 10 mg via INTRAVENOUS

## 2018-02-07 MED ORDER — CEFAZOLIN SODIUM-DEXTROSE 1-4 GM/50ML-% IV SOLN
1.0000 g | Freq: Three times a day (TID) | INTRAVENOUS | Status: DC
  Administered 2018-02-08 (×2): 1 g via INTRAVENOUS
  Filled 2018-02-07 (×2): qty 50

## 2018-02-07 MED ORDER — ONDANSETRON HCL 4 MG/2ML IJ SOLN
INTRAMUSCULAR | Status: DC | PRN
  Administered 2018-02-07: 4 mg via INTRAVENOUS

## 2018-02-07 MED ORDER — SODIUM CHLORIDE 0.9 % IV SOLN
INTRAVENOUS | Status: DC | PRN
  Administered 2018-02-07: 50 ug/min via INTRAVENOUS

## 2018-02-07 MED ORDER — OXYCODONE HCL 5 MG PO TABS: 5.0000 mg | ORAL_TABLET | Freq: Four times a day (QID) | ORAL | 0 refills | Status: AC | PRN

## 2018-02-07 SURGICAL SUPPLY — 74 items
BAG URINE DRAINAGE (UROLOGICAL SUPPLIES) ×8 IMPLANT
BAG URO CATCHER STRL LF (MISCELLANEOUS) ×4 IMPLANT
BASKET LASER NITINOL 1.9FR (BASKET) ×4 IMPLANT
BASKET ZERO TIP NITINOL 2.4FR (BASKET) ×4 IMPLANT
BENZOIN TINCTURE PRP APPL 2/3 (GAUZE/BANDAGES/DRESSINGS) ×12 IMPLANT
BLADE SURG 15 STRL LF DISP TIS (BLADE) ×2 IMPLANT
BLADE SURG 15 STRL SS (BLADE) ×2
CATH FOLEY 2W COUNCIL 20FR 5CC (CATHETERS) IMPLANT
CATH FOLEY 2WAY SLVR  5CC 16FR (CATHETERS) ×2
CATH FOLEY 2WAY SLVR 5CC 16FR (CATHETERS) ×2 IMPLANT
CATH IMAGER II 65CM (CATHETERS) ×4 IMPLANT
CATH INTERMIT  6FR 70CM (CATHETERS) ×4 IMPLANT
CATH MULTI PURPOSE 16FR DRAIN (CATHETERS) ×4 IMPLANT
CATH ROBINSON RED A/P 20FR (CATHETERS) IMPLANT
CATH ULTRATHANE 14FR (CATHETERS) ×4 IMPLANT
CATH X-FORCE N30 NEPHROSTOMY (TUBING) ×4 IMPLANT
CHLORAPREP W/TINT 26ML (MISCELLANEOUS) ×8 IMPLANT
CLOTH BEACON ORANGE TIMEOUT ST (SAFETY) ×4 IMPLANT
CONT SPEC 4OZ CLIKSEAL STRL BL (MISCELLANEOUS) ×4 IMPLANT
COVER WAND RF STERILE (DRAPES) ×4 IMPLANT
DRAPE C-ARM 42X120 X-RAY (DRAPES) ×4 IMPLANT
DRAPE LINGEMAN PERC (DRAPES) ×4 IMPLANT
DRAPE SHEET LG 3/4 BI-LAMINATE (DRAPES) ×4 IMPLANT
DRAPE SURG IRRIG POUCH 19X23 (DRAPES) ×4 IMPLANT
DRSG PAD ABDOMINAL 8X10 ST (GAUZE/BANDAGES/DRESSINGS) ×8 IMPLANT
DRSG TEGADERM 4X4.75 (GAUZE/BANDAGES/DRESSINGS) ×4 IMPLANT
DRSG TEGADERM 8X12 (GAUZE/BANDAGES/DRESSINGS) ×8 IMPLANT
EXTRACTOR STONE 1.7FRX115CM (UROLOGICAL SUPPLIES) IMPLANT
FIBER LASER FLEXIVA 1000 (UROLOGICAL SUPPLIES) IMPLANT
FIBER LASER FLEXIVA 365 (UROLOGICAL SUPPLIES) IMPLANT
FIBER LASER FLEXIVA 550 (UROLOGICAL SUPPLIES) IMPLANT
FIBER LASER TRAC TIP (UROLOGICAL SUPPLIES) IMPLANT
GAUZE SPONGE 4X4 12PLY STRL (GAUZE/BANDAGES/DRESSINGS) ×4 IMPLANT
GLOVE BIOGEL M STRL SZ7.5 (GLOVE) IMPLANT
GLOVE SURG SS PI 6.5 STRL IVOR (GLOVE) ×12 IMPLANT
GLOVE SURG SS PI 7.5 STRL IVOR (GLOVE) ×16 IMPLANT
GOWN STRL REUS W/TWL LRG LVL3 (GOWN DISPOSABLE) ×8 IMPLANT
GUIDEWIRE AMPLAZ .035X145 (WIRE) ×8 IMPLANT
GUIDEWIRE ANG ZIPWIRE 038X150 (WIRE) ×8 IMPLANT
GUIDEWIRE STR DUAL SENSOR (WIRE) ×4 IMPLANT
IV NS 1000ML (IV SOLUTION) ×2
IV NS 1000ML BAXH (IV SOLUTION) ×2 IMPLANT
IV SET EXTENSION CATH 6 NF (IV SETS) ×4 IMPLANT
KIT BASIN OR (CUSTOM PROCEDURE TRAY) ×4 IMPLANT
LEGGING LITHOTOMY PAIR STRL (DRAPES) ×4 IMPLANT
MANIFOLD NEPTUNE II (INSTRUMENTS) ×4 IMPLANT
NEEDLE TROCAR 18X15 ECHO (NEEDLE) IMPLANT
NEEDLE TROCAR 18X20 (NEEDLE) IMPLANT
NS IRRIG 1000ML POUR BTL (IV SOLUTION) ×4 IMPLANT
PACK CYSTO (CUSTOM PROCEDURE TRAY) ×4 IMPLANT
PROBE LITHOCLAST ULTRA 3.8X403 (UROLOGICAL SUPPLIES) IMPLANT
PROBE PNEUMATIC 1.0MMX570MM (UROLOGICAL SUPPLIES) ×4 IMPLANT
SET IRRIG Y TYPE TUR BLADDER L (SET/KITS/TRAYS/PACK) ×4 IMPLANT
SHEATH PEELAWAY SET 9 (SHEATH) ×4 IMPLANT
SHEATH URETERAL 12FRX28CM (UROLOGICAL SUPPLIES) IMPLANT
SHEATH URETERAL 12FRX35CM (MISCELLANEOUS) IMPLANT
SPONGE LAP 4X18 RFD (DISPOSABLE) ×4 IMPLANT
SUT SILK 2 0 30  PSL (SUTURE) ×2
SUT SILK 2 0 30 PSL (SUTURE) ×2 IMPLANT
SUT VIC AB 0 CT2 27 (SUTURE) ×4 IMPLANT
SYR 10ML LL (SYRINGE) ×4 IMPLANT
SYR 20CC LL (SYRINGE) ×8 IMPLANT
SYR 50ML LL SCALE MARK (SYRINGE) ×4 IMPLANT
SYR CONTROL 10ML LL (SYRINGE) ×4 IMPLANT
TOWEL OR 17X26 10 PK STRL BLUE (TOWEL DISPOSABLE) ×4 IMPLANT
TRAY FOL W/BAG SLVR 16FR STRL (SET/KITS/TRAYS/PACK) ×2 IMPLANT
TRAY FOLEY MTR SLVR 16FR STAT (SET/KITS/TRAYS/PACK) ×4 IMPLANT
TRAY FOLEY W/BAG SLVR 16FR LF (SET/KITS/TRAYS/PACK) ×2
TUBE CONNECTING VINYL 14FR 30C (TUBING) ×4 IMPLANT
TUBE FEEDING 8FR 16IN STR KANG (MISCELLANEOUS) ×4 IMPLANT
TUBING CONNECTING 10 (TUBING) ×9 IMPLANT
TUBING CONNECTING 10' (TUBING) ×3
WATER STERILE IRR 1000ML POUR (IV SOLUTION) ×4 IMPLANT
WATER STERILE IRR 3000ML UROMA (IV SOLUTION) ×4 IMPLANT

## 2018-02-07 NOTE — Discharge Instructions (Signed)
1 - You may have urinary urgency (bladder spasms) and bloody urine on / off with stent in place. This is normal.  2 - Call MD or go to ER for fever >102, severe pain / nausea / vomiting not relieved by medications, or acute change in medical status  3 - Start the prescribed antibiotic the day prior to your follow-up appointment. Complete the entire prescribed course.

## 2018-02-07 NOTE — Brief Op Note (Signed)
02/07/2018  4:56 PM  PATIENT:  Rosemary Holms  52 y.o. female  PRE-OPERATIVE DIAGNOSIS:  LEFT GREATER THAN RIGHT RENAL STONES  POST-OPERATIVE DIAGNOSIS:  LEFT GREATER THAN RIGHT RENAL STONES  PROCEDURE:  Procedure(s) with comments: SECOND LOOK NEPHROLITHOTOMY PERCUTANEOUS (Left) - 2 HRS CYSTOSCOPY LEFT STENT EXCHANGE (Right)  SURGEON:  Surgeon(s) and Role:    * Alexis Frock, MD - Primary  PHYSICIAN ASSISTANT:   ASSISTANTS: none   ANESTHESIA:   general  EBL:  minimal   BLOOD ADMINISTERED:none  DRAINS: foley to gravity   LOCAL MEDICATIONS USED:  NONE  SPECIMEN:  No Specimen  DISPOSITION OF SPECIMEN:  N/A  COUNTS:  YES  TOURNIQUET:  * No tourniquets in log *  DICTATION: .Other Dictation: Dictation Number [id number]  PLAN OF CARE: Admit to inpatient   PATIENT DISPOSITION:  PACU - hemodynamically stable.   Delay start of Pharmacological VTE agent (>24hrs) due to surgical blood loss or risk of bleeding: yes

## 2018-02-07 NOTE — Transfer of Care (Signed)
Immediate Anesthesia Transfer of Care Note  Patient: Pamela Carney  Procedure(s) Performed: SECOND LOOK NEPHROLITHOTOMY PERCUTANEOUS (Left Renal) CYSTOSCOPY LEFT STENT EXCHANGE (Right Ureter)  Patient Location: PACU  Anesthesia Type:General  Level of Consciousness: awake, alert , oriented and patient cooperative  Airway & Oxygen Therapy: Patient Spontanous Breathing and Patient connected to face mask oxygen  Post-op Assessment: Report given to RN and Post -op Vital signs reviewed and stable  Post vital signs: Reviewed and stable  Last Vitals:  Vitals Value Taken Time  BP 93/53 02/07/2018  5:10 PM  Temp    Pulse 77 02/07/2018  5:14 PM  Resp 20 02/07/2018  5:14 PM  SpO2 92 % 02/07/2018  5:14 PM  Vitals shown include unvalidated device data.  Last Pain:  Vitals:   02/07/18 1316  TempSrc: Oral  PainSc:       Patients Stated Pain Goal: 3 (71/21/97 5883)  Complications: No apparent anesthesia complications

## 2018-02-07 NOTE — Anesthesia Preprocedure Evaluation (Addendum)
Anesthesia Evaluation  Patient identified by MRN, date of birth, ID band Patient awake    Reviewed: Allergy & Precautions, NPO status , Patient's Chart, lab work & pertinent test results  History of Anesthesia Complications (+) PONV and history of anesthetic complications  Airway Mallampati: II  TM Distance: >3 FB Neck ROM: Full    Dental no notable dental hx. (+) Poor Dentition, Dental Advisory Given, Edentulous Upper   Pulmonary Current Smoker,    Pulmonary exam normal        Cardiovascular hypertension, negative cardio ROS Normal cardiovascular exam     Neuro/Psych  Headaches, PSYCHIATRIC DISORDERS Anxiety Bipolar Disorder Neuropathy  Neuromuscular disease    GI/Hepatic Neg liver ROS, GERD  ,  Endo/Other  diabetesMorbid obesity  Renal/GU   negative genitourinary   Musculoskeletal  (+) Arthritis , Osteoarthritis,    Abdominal (+) + obese,   Peds negative pediatric ROS (+)  Hematology negative hematology ROS (+)   Anesthesia Other Findings   Reproductive/Obstetrics negative OB ROS                            Anesthesia Physical  Anesthesia Plan  ASA: III  Anesthesia Plan: General   Post-op Pain Management:    Induction: Intravenous  PONV Risk Score and Plan: Ondansetron, Dexamethasone and Scopolamine patch - Pre-op  Airway Management Planned: Oral ETT  Additional Equipment:   Intra-op Plan:   Post-operative Plan: Extubation in OR  Informed Consent: I have reviewed the patients History and Physical, chart, labs and discussed the procedure including the risks, benefits and alternatives for the proposed anesthesia with the patient or authorized representative who has indicated his/her understanding and acceptance.   Dental advisory given  Plan Discussed with: CRNA and Anesthesiologist  Anesthesia Plan Comments:        Anesthesia Quick Evaluation

## 2018-02-07 NOTE — Progress Notes (Signed)
Pt returns from PACU in stable condition. VSs pt old Nephrostomy site intact with serosanguinous drainage noted. Pt c/o pain, medicated as ordered. Pt foley patent with clear pink tinged urine. SRP, RN

## 2018-02-07 NOTE — Anesthesia Procedure Notes (Signed)
Procedure Name: Intubation Date/Time: 02/07/2018 3:42 PM Performed by: Sharlette Dense, CRNA Patient Re-evaluated:Patient Re-evaluated prior to induction Oxygen Delivery Method: Circle system utilized Preoxygenation: Pre-oxygenation with 100% oxygen Induction Type: IV induction Ventilation: Mask ventilation without difficulty and Oral airway inserted - appropriate to patient size Laryngoscope Size: Miller and 3 Grade View: Grade I Tube type: Oral Tube size: 7.5 mm Number of attempts: 1 Airway Equipment and Method: Stylet Placement Confirmation: ETT inserted through vocal cords under direct vision,  positive ETCO2 and breath sounds checked- equal and bilateral Secured at: 21 cm Tube secured with: Tape Dental Injury: Teeth and Oropharynx as per pre-operative assessment

## 2018-02-07 NOTE — Anesthesia Postprocedure Evaluation (Signed)
Anesthesia Post Note  Patient: Pamela Carney  Procedure(s) Performed: SECOND LOOK NEPHROLITHOTOMY PERCUTANEOUS (Left Renal) CYSTOSCOPY LEFT STENT EXCHANGE (Right Ureter)     Patient location during evaluation: PACU Anesthesia Type: General Level of consciousness: sedated Pain management: pain level controlled Vital Signs Assessment: post-procedure vital signs reviewed and stable Respiratory status: spontaneous breathing and respiratory function stable Cardiovascular status: stable Postop Assessment: no apparent nausea or vomiting Anesthetic complications: no    Last Vitals:  Vitals:   02/07/18 1745 02/07/18 1800  BP: 90/74 90/67  Pulse: 62 72  Resp: 12 12  Temp:    SpO2: 97% 97%    Last Pain:  Vitals:   02/07/18 1745  TempSrc:   PainSc: [place]

## 2018-02-07 NOTE — Progress Notes (Signed)
3 Days Post-Op   Subjective/Chief Complaint:  1 - Left > Right Bilateral Renal Stones - sp right ureteroscopy to stone free and left 1st stage percutaneous surgery on 02/04/18, the day of admission.   Today "Pamela Carney" is  Stable. No fevers overnight. NPO for 2nd stage surgery today.    Objective: Vital signs in last 24 hours: Temp:  [97.8 F (36.6 C)-98.3 F (36.8 C)] 97.8 F (36.6 C) (11/18 0502) Pulse Rate:  [62-70] 62 (11/18 0502) Resp:  [12-18] 12 (11/18 0502) BP: (95-132)/(55-83) 115/77 (11/18 0502) SpO2:  [92 %-95 %] 95 % (11/18 0502) Last BM Date: 02/03/18  Intake/Output from previous day: 11/17 0701 - 11/18 0700 In: 1297.3 [I.V.:1147.3; IV Piggyback:150] Out: 2600 [Urine:2600] Intake/Output this shift: No intake/output data recorded.  General appearance: alert, cooperative and playing on tablet computer Eyes: negative Nose: Nares normal. Septum midline. Mucosa normal. No drainage or sinus tenderness. Throat: no thrush Neck: supple, symmetrical, trachea midline Back: symmetric, no curvature. ROM normal. No CVA tenderness. Resp: non-labored on room air.  Cardio: Nl rate GI: soft, non-tender; bowel sounds normal; no masses,  no organomegaly Pelvic: external genitalia normal and foley in place with medium yellow urine, scant debris, that is non-foul.  Extremities: extremities normal, atraumatic, no cyanosis or edema Lymph nodes: Cervical, supraclavicular, and axillary nodes normal. Neurologic: Grossly normal Incision/Wound: left neph tube in place with medium non-foul yelllw urien. No flank hematomas.   Lab Results:  Recent Labs    02/04/18 1522 02/05/18 0437  HGB 12.7 12.5  HCT 41.5 40.6   BMET Recent Labs    02/05/18 0437  NA 138  K 4.5  CL 103  CO2 26  GLUCOSE 137*  BUN 16  CREATININE 0.91  CALCIUM 9.8   PT/INR No results for input(s): LABPROT, INR in the last 72 hours. ABG No results for input(s): PHART, HCO3 in the last 72 hours.  Invalid  input(s): PCO2, PO2  Studies/Results: No results found.  Anti-infectives: Anti-infectives (From admission, onward)   Start     Dose/Rate Route Frequency Ordered Stop   02/07/18 1500  ceFAZolin (ANCEF) IVPB 2g/100 mL premix     2 g 200 mL/hr over 30 Minutes Intravenous 30 min pre-op 02/06/18 0949     02/07/18 0600  gentamicin (GARAMYCIN) 380 mg in dextrose 5 % 100 mL IVPB     5 mg/kg  76 kg (Order-Specific) 109.5 mL/hr over 60 Minutes Intravenous 30 min pre-op 02/06/18 1125     02/06/18 0900  ceFAZolin (ANCEF) IVPB 1 g/50 mL premix  Status:  Discontinued     1 g 100 mL/hr over 30 Minutes Intravenous Every 8 hours 02/06/18 0819 02/07/18 0635   02/04/18 0907  gentamicin (GARAMYCIN) 360 mg in dextrose 5 % 100 mL IVPB     5 mg/kg  71.3 kg (Adjusted) 109 mL/hr over 60 Minutes Intravenous 30 min pre-op 02/04/18 0907 02/04/18 1200      Assessment/Plan:  Proceed as planned with LEFT 2nd stage percutaneous nephrostolithotomy with goal of stone free. Risks, benefits, alternatives, expected peri-op course with likely DC tomorrow as long as uncomplicated peri-op course discussed.   [facility], Karianne Nogueira 02/07/2018

## 2018-02-08 ENCOUNTER — Encounter (HOSPITAL_COMMUNITY): Payer: Self-pay | Admitting: Urology

## 2018-02-08 LAB — GLUCOSE, CAPILLARY
Glucose-Capillary: 167 mg/dL — ABNORMAL HIGH (ref 70–99)
Glucose-Capillary: 144 mg/dL — ABNORMAL HIGH (ref 70–99)
Glucose-Capillary: 175 mg/dL — ABNORMAL HIGH (ref 70–99)

## 2018-02-08 NOTE — Discharge Summary (Signed)
[facility] Discharge Summary  Admit date: 02/04/2018  Discharge date and time: 02/08/18   Discharge to: Home  Discharge Service: Urology  Discharge Attending Physician:  Alexis Frock, MD  Discharge  Diagnoses: Staghorn renal calculus  Secondary Diagnosis: Active Problems:   Staghorn calculus   OR Procedures: Procedure(s): SECOND LOOK NEPHROLITHOTOMY PERCUTANEOUS CYSTOSCOPY LEFT STENT EXCHANGE 02/07/2018   Ancillary Procedures: None   Discharge Day Services: The patient was seen and examined by the Urology team both in the morning and immediately prior to discharge.  Vital signs and laboratory values were stable and within normal limits.  The physical exam was benign and unchanged and all surgical wounds were examined.  Discharge instructions were explained and all questions answered.  Subjective  No acute events overnight. Pain Controlled. No fever or chills.  Objective Patient Vitals for the past 8 hrs:  BP Temp Temp src Pulse Resp SpO2  02/08/18 0559 125/76 98.1 F (36.7 C) Oral (!) 57 12 96 %  02/08/18 0406 113/65 - - (!) 54 - -  02/08/18 0211 102/63 - - (!) 52 - -  02/08/18 0130 92/66 - - 62 - -  02/08/18 0122 (!) 87/64 98.4 F (36.9 C) Oral (!) 51 18 94 %   No intake/output data recorded.  General Appearance:        No acute distress Lungs:                       Normal work of breathing on room air Heart:                                Regular rate and rhythm Abdomen:                         Soft, non-tender, stable large truncal obesity Extremities:                      Warm and well perfused  Left flank site c/d/i with dry dressing.   Hospital Course:  The patient underwent left PCNL and right ureteral stone extraction on 02/04/18. She subsequently underwent a second-look PCNL on 02/07/2018. No residual stone in the left kidney was visualized during the second look procedure. The patient tolerated the procedure well, was extubated in the OR, and  afterwards was taken to the PACU for routine post-surgical care. When stable the patient was transferred to the floor. The patient did well postoperatively. The patient's diet was slowly advanced and at the time of discharge was tolerating a regular diet. The patient was discharged home 1 Day Post-Op, at which point was tolerating a regular solid diet, was able to void spontaneously, have adequate pain control with P.O. pain medication, and could ambulate without difficulty. The patient will follow up with [place] for post op check.   Condition at Discharge: Improved

## 2018-02-08 NOTE — Op Note (Signed)
NAME: Pamela Carney, [place] MEDICAL RECORD [id number] ACCOUNT [id number] DATE OF BIRTH:1965-10-27 FACILITY: [facility] LOCATION: [facility] PHYSICIAN:Tenesia Escudero, MD  OPERATIVE REPORT  DATE OF PROCEDURE:  02/07/2018  PREOPERATIVE DIAGNOSIS:  History of left partial staghorn kidney stone, right renal stone.  PROCEDURE: 1.  Left 2nd stage percutaneous nephrostolithotomy, stone less than 2 cm. 2.  Left antegrade nephrostogram interpretation. 3.  Exchange of left ureteral stent 6 x 24 Contour, no tether. 4.  Left diagnostic ureteroscopy.  ESTIMATED BLOOD LOSS:  Nil.  COMPLICATIONS:  None.  SPECIMENS:  None.  FINDINGS: 1.  Minimal volume residual left renal stone, mostly papillary tip. 2.  Complete resolution of all stone fragments larger than one-third millimeter within the accessible calices of the kidney and left ureter. 3.  Successful placement of left ureteral stent proximal end renal pelvis, distally in the bladder.  INDICATIONS:  This is a pleasant, but somewhat comorbid, 52 year old lady with history of morbid obesity, poorly compliant diabetes, who was found to have an episode of septic obstructing stone several months ago.  She was temporized with a left  nephrostomy tube placement.  She has a left greater than right stone burden.  I have discussed with management and she wished to proceed with a path to stone free bilaterally.  As such, she underwent a right ureteroscopic stone manipulation and left  first stage percutaneous nephrolithotomy on 02/04/2018 where the majority of left-sided stone burden was addressed in a percutaneous fashion in a right-sided stone burden was felt to be completely addressed.  She now presents for second-stage procedure  on the left to verify stone free status.  Informed consent was then placed in medical record.  DESCRIPTION OF PROCEDURE:  The patient being Pamela Carney, procedure being left second-stage percutaneous nephrolithotomy, possible right  ureteroscopy was confirmed.  Procedure timeout was performed.  Antibiotics administered.  General anesthesia  induced.  The patient was placed into a modified low lithotomy position employing a gel roll out her left flank to allow access to the left flank and nephrostomy area as well as her perineum.  She was further fastened to operative table using 3-inch tape  across her lower left abdomen which retracted her pannus in a rotational fashion to allow better access to the left flank.  Her left arm was brought across her chest and stabilized across her chest using 3 pillows and taping across from padding.  Her  vagina, introitus and proximal thighs were prepped using iodine after the in situ Foley catheter was removed.  Her left flank was pre-prepped using chlorhexidine gluconate including the left nephrostomy and nephroureteral catheter, and a separate  percutaneous drape was applied.  A latex-free Foley catheter was placed free to straight drain.  Attention was directed that a left antegrade nephrostogram.  Initial left antegrade nephrostogram revealed excellent placement of situ nephrostomy tube nephroureteral stent.  There was filling of all affected calices without obvious filling defect.  There was a lower pole focal hydronephrosis as expected.  The in  situ nephrostomy tube was then removed and flexible nephroscopy was performed using a 16-French flexible scope in an antegrade fashion.  Inspection of all accessible calices revealed minimal residual stone volume, only several very small papillary tip  calcifications which were less than one-third millimeter.  This was a quite favorable.  There was some focal lower pole hydronephrosis as expected given prior recent impacted stone.  The area of the UPJ was inspected and found to be uninjured and visibly  intact.  A 0.038  ZIPwire was advanced in an antegrade fashion at the level of the urinary bladder.  A ZIPwire was brought through the nephroureteral  stent, which was then removed.  The ZIPwire acting as a safety wire and antegrade passage of the  flexible digital ureteroscope was performed under continuous fluoroscopic guidance to the level of the urinary bladder and flexible digital ureteroscopy performed the entire length of the right ureter.  No mucosal abnormalities or calcifications were  seen whatsoever.  This is quite favorable and verified stone free status and all accessible calices within the left kidney as well as the ureter.  It was felt that interval stenting would be warranted.  As such, a new 6 x 24 Contour Polaris-type stent  was placed over the safety wire using nephroscopic and fluoroscopic guidance, good proximal and distal plane were noted.  The percutaneous was closed using 5 mL of tissue sealant and interrupted Vicryl at the level of the skin followed by percutaneous  drape, a primary dressing and the procedure was terminated.    The patient tolerated the procedure well.  No immediate complications.  The patient was taken to postanesthesia care in stable condition.  We plan for continued admission and likely discharge tomorrow.  AN/NUANCE  D:02/07/2018 T:02/08/2018 JOB:[id number]

## 2018-02-21 NOTE — Progress Notes (Signed)
Subjective:    Patient ID: Pamela Carney, female    DOB: [birth year], 52 y.o.   MRN: [id number]  HPI: 02/01/18 OV:  Pamela Carney is here to establish as a new pt.  She is a pleasant 52 year old female. PMH: Migraine HA with excessive NSAID use Bipolar- previously on Depakote and Lamictal- urgent referral to Psychiatry placed 12/29/17- Presented to ED with dysuria, found to have acute kidney injury with CKD III, transferred treated in ICU- fluids, IV ABX,  underwent percutaneous placement of a nephrostomy tube managed by IR. Plan is for outpatient follow-up with urology for definitive treatment of stone. Acute kidney injury resolved and she will follow-up with nephrology as an outpatient, to f/u with Dr. Joelyn Oms.  HTN- previously on HCTZ, losartan, bisoprolol She completed course of oral Keflex She has percutaneous nephrolithotomy ureteroscopy/lithitripsy with stent scheduled this Friday 1115/19 and stent exchange 02/07/18 Upon chart review, it was discovered that her A1c was sig elevated She also reports polyuria/polydipsia and 40 lb weight loss over the last yr She was not started on anti-diabetic medications at discharge She reports some dizziness with position changes, BP today 106/74  Lab Results  Component Value Date   HGBA1C 11.3 (H) 12/28/2017   She has not had primary care in >2 years She has not been seen by Behavioral Health >2 years She denies thoughts of harming herself/others and had previously been off all mental health medications for years. She smokes pack/day, >37 years She is married  02/23/18 OV: Pamela Carney is here for f/u: blood pressure and new onset T2D Amlodipine was d/c'd 3 weeks ago, not currently on any anti-hypertensives She reports BP was low Urology earlier in week, SBP in 100s She reports complete resolution of dizziness She was started on Metformin and has titrated up to 549m BID, denies GI upset She has been injecting [place] U nightly She  reports AM BS 110-130s, Post Prandial BS 160-190s She denies episodes of hypogylcemia She underwent nephrolithotomy and cystoscopy with stent exchange 3  weeks ago- and is now followed by [facility] Next f/u with [facility] 04/19/17 She has been reducing sugar/CHO food intake but continues to drink coke and ginger ale. She reports only activity is "walking around my house" She reports bil burning foor pain and "tough/thick toenails" [facility] [place]  Patient Care Team    Relationship Specialty Notifications Start End  DEsaw Grandchild NP PCP - General Family Medicine  02/01/18   MAlexis Frock MD Consulting Physician Urology  02/01/18     Patient Active Problem List   Diagnosis Date Noted  . New onset type 2 diabetes mellitus ([place] 02/01/2018  . Bipolar 1 disorder ([place] 02/01/2018  . IUD (intrauterine device) in place 02/01/2018  . Obesity 02/01/2018  . Healthcare maintenance 02/01/2018  . CKD (chronic kidney disease), stage III ([place] 12/28/2017  . 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 Acute cystitis with hematuria   . Chronic interstitial nephritis 12/24/2017  . Generalized weakness 12/24/2017  . Sepsis ([place] 12/24/2017  . Hypokalemia 12/24/2017  . Oral candidiasis 12/24/2017  . Neuropathy involving both lower extremities 01/31/2014  . Perimenopausal 12/29/2012  . Family planning, IUD (intrauterine device) check/reinsertion/removal 12/28/2012  . Pain 12/27/2012     Past Medical History:  Diagnosis Date  . Abnormal Pap smear   . AKI (acute kidney injury) ([facility] 12/28/2017  . Anxiety   . Arthritis    bil knees, neck  . Bipolar 1 disorder ([place]   .  Cataract    Mild  . Cholelithiasis 12/23/2017   noted on CT renal, pt unaware  . Chronic kidney disease (CKD), stage III (moderate) ([facility])   . Cystitis   . Genital warts    Hx of genital  . GERD (gastroesophageal reflux disease)   . Heart murmur    childhood  . Hematuria   . History of blood transfusion   .  History of ectopic pregnancy   . History of gestational diabetes   . History of kidney stones   . History of sepsis    after ectopic pregnancy  . Hypertension   . Idiopathic peripheral neuropathy    both feet  . Leg ulcer, left ([place])   . Low back pain   . Migraines   . Obesity   . Oral candidiasis   . Pneumonia   . PONV (postoperative nausea and vomiting)    prolonged sedation  . Recurrent UTI   . 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 Sigmoid diverticulosis 12/23/2017   noted on CT renal, pt unaware  . Ulcer of foot ([place])    Left  . Weakness      Past Surgical History:  Procedure Laterality Date  . CERVICAL CONE BIOPSY    . CESAREAN SECTION     x3  . CYSTOSCOPY/URETEROSCOPY/HOLMIUM LASER/STENT PLACEMENT Right 02/04/2018   Procedure: CYSTOSCOPY/URETEROSCOPY/RETROGRADE PYELOGRAM/HOLMIUM LASER/STENT PLACEMENT;  Surgeon: Alexis Frock, MD;  Location: [facility];  Service: Urology;  Laterality: Right;  . CYSTOSCOPY/URETEROSCOPY/HOLMIUM LASER/STENT PLACEMENT Right 02/07/2018   Procedure: CYSTOSCOPY LEFT STENT EXCHANGE;  Surgeon: Alexis Frock, MD;  Location: [facility];  Service: Urology;  Laterality: Right;  . DILATION AND CURETTAGE OF UTERUS    . ECTOPIC PREGNANCY SURGERY    . IR NEPHROSTOMY PLACEMENT LEFT  12/25/2017  . NEPHROLITHOTOMY Left 02/04/2018   Procedure: NEPHROLITHOTOMY PERCUTANEOUS;  Surgeon: Alexis Frock, MD;  Location: [facility];  Service: Urology;  Laterality: Left;  3 HRS  . NEPHROLITHOTOMY Left 02/07/2018   Procedure: SECOND LOOK NEPHROLITHOTOMY PERCUTANEOUS;  Surgeon: Alexis Frock, MD;  Location: [facility];  Service: Urology;  Laterality: Left;  2 HRS  . OVARIAN CYST REMOVAL    . UNILATERAL SALPINGECTOMY     Pt unsure of which fallopian tube was removed  . WISDOM TOOTH EXTRACTION       Family History  Problem Relation Age of Onset  . Diabetes Paternal Grandfather   .  COPD Paternal Grandfather   . Heart disease Paternal Grandfather   . Cancer Paternal Grandfather        bone  . Cancer Paternal Grandmother   . Heart disease Paternal Grandmother   . Cancer Father   . Hypertension Father   . Heart attack Father   . Alcohol abuse Father   . Depression Father   . Heart failure Mother   . Diabetes Mother   . Heart disease Mother   . Hyperlipidemia Mother   . Hypertension Mother   . Heart attack Mother   . Peripheral vascular disease Mother   . Depression Mother   . COPD Mother   . Hypertension Sister   . Heart attack Sister   . Stroke Maternal Grandmother      Social History   Substance and Sexual Activity  Drug Use No     Social History   Substance and Sexual Activity  Alcohol Use No  . Alcohol/week: 0.0 standard drinks  Social History   Tobacco Use  Smoking Status Current Every Day Smoker  . Packs/day: 1.00  . Years: 37.00  . Pack years: 37.00  . Types: Cigarettes  Smokeless Tobacco Never Used     Outpatient Encounter Medications as of 02/23/2018  Medication Sig  . Blood Glucose Monitoring Suppl (ONETOUCH VERIO) w/Device KIT Use to check blood sugars every morning fasting and 2 hours after largest meal  . glucose blood (ONETOUCH VERIO) test strip Use to check blood sugars every morning fasting and 2 hours after largest meal  . insulin degludec (TRESIBA) 100 UNIT/ML SOPN FlexTouch Pen Inject 0.2 mLs (20 Units total) into the skin daily.  Marland Kitchen levonorgestrel (MIRENA) 20 MCG/24HR IUD 1 each by Intrauterine route once.  . metFORMIN (GLUCOPHAGE) 500 MG tablet Take by mouth 2 (two) times daily with a meal.  . Multiple Vitamins-Minerals (ONE-A-DAY WOMENS 50+ ADVANTAGE) TABS Take 1 tablet by mouth daily.  . ONE TOUCH LANCETS MISC Use to check blood sugars every morning fasting and 2 hours after largest meal  . Insulin Pen Needle (PEN NEEDLES) 31G X 8 MM MISC 1 each by Does not apply route daily.  . [DISCONTINUED] cephALEXin  (KEFLEX) 500 MG capsule Take 1 capsule (500 mg total) by mouth 2 (two) times daily for 3 days. Start the day prior to your follow-up appointment.  . [DISCONTINUED] metFORMIN (GLUCOPHAGE) 500 MG tablet 1/2 tablet with dinner for one week.  1 take one tablet with dinner for one week. 1 tablet with breakfast, one tablet with dinner.   No facility-administered encounter medications on file as of 02/23/2018.     Allergies: Sulfa antibiotics; Latex; and Tape  Body mass index is 40.46 kg/m.  Blood pressure 137/80, pulse 87, temperature 98.1 F (36.7 C), temperature source Oral, height 5' 2"  (1.575 m), weight 221 lb 3.2 oz (100.3 kg), SpO2 96 %.  Review of Systems  Constitutional: Positive for fatigue. Negative for activity change, appetite change, chills, diaphoresis, fever and unexpected weight change.  Respiratory: Negative for cough, chest tightness, shortness of breath, wheezing and stridor.   Cardiovascular: Negative for chest pain, palpitations and leg swelling.  Gastrointestinal: Negative for abdominal distention, abdominal pain, blood in stool, constipation, diarrhea, nausea and vomiting.  Genitourinary: Positive for frequency. Negative for difficulty urinating, dysuria and flank pain.  Musculoskeletal: Positive for arthralgias and back pain.  Skin: Negative for color change, rash and wound.  Neurological: Negative for dizziness and headaches.  Hematological: Does not bruise/bleed easily.  Psychiatric/Behavioral: Positive for sleep disturbance. Negative for agitation, behavioral problems, confusion, decreased concentration, dysphoric mood, hallucinations, self-injury and suicidal ideas. The patient is not nervous/anxious and is not hyperactive.        Objective:   Physical Exam  Constitutional: She is oriented to person, place, and time. She appears well-developed and well-nourished. No distress.  HENT:  Head: Normocephalic and atraumatic.  Right Ear: External ear normal.  Left  Ear: External ear normal.  Nose: Nose normal.  Mouth/Throat: Oropharynx is clear and moist.  Eyes: Pupils are equal, round, and reactive to light. Conjunctivae and EOM are normal.  Cardiovascular: Normal rate, regular rhythm, normal heart sounds and intact distal pulses.  Pulmonary/Chest: Effort normal and breath sounds normal. No stridor. No respiratory distress. She has no wheezes. She has no rales. She exhibits no tenderness.  Abdominal: She exhibits no distension and no mass. There is no tenderness. There is no rebound and no guarding. No hernia.  Neurological: She is alert and oriented to person,  place, and time.  Skin: Skin is warm and dry. Capillary refill takes less than 2 seconds. She is not diaphoretic.     Intact L flank linear surgical incision, well approximated with 3 interrupted sutures No drainage noted   Psychiatric: She has a normal mood and affect. Her behavior is normal. Judgment and thought content normal.  Nursing note and vitals reviewed.     Assessment & Plan:   1. New onset type 2 diabetes mellitus ([place])   2. Healthcare maintenance     Healthcare maintenance Continue all medications as directed. Increase water intake and continue to follow Diabetic diet. Increase daily walking. Continue to check blood sugar daily and start checking blood pressure (2-3 times week). If fasting blood sugar consistently <100 or >140, call clinic. If after meal blood sugar consistently >240, call clinic. If your blood pressure is consistently <100/60 or >140/90, call clinic. Great job on your blood pressure, blood sugar log, and weight loss! Podiatry referral placed. Follow-up in 6 weeks, sooner if needed.  New onset type 2 diabetes mellitus ([place]) She was started on Metformin and has titrated up to 553m BID, denies GI upset She has been injecting [place] U nightly She reports AM BS 110-130s, Post Prandial BS 160-190s She denies episodes of hypogylcemia Will recheck A1c  at f/u in 6 weeks Microalbumin- abnormal  If abnormal at recheck will re-start Losartan   If BP rises above goal, will restart losartan  FOLLOW-UP:  Return in about 6 weeks (around 04/06/2018) for Regular Follow Up, Obesity, Diabetes.

## 2018-02-23 ENCOUNTER — Encounter: Payer: Self-pay | Admitting: Adult Health

## 2018-02-23 ENCOUNTER — Ambulatory Visit (INDEPENDENT_AMBULATORY_CARE_PROVIDER_SITE_OTHER): Payer: 59 | Admitting: Adult Health

## 2018-02-23 VITALS — BP 137/80 | HR 87 | Temp 98.1°F | Ht 62.0 in | Wt 221.2 lb

## 2018-02-23 DIAGNOSIS — Z0000 Encounter for general adult medical examination without abnormal findings: Secondary | ICD-10-CM | POA: Diagnosis not present

## 2018-02-23 DIAGNOSIS — E119 Type 2 diabetes mellitus without complications: Secondary | ICD-10-CM | POA: Diagnosis not present

## 2018-02-23 LAB — POCT UA - MICROALBUMIN
Creatinine, POC: 300 mg/dL
Microalbumin Ur, POC: 150 mg/L

## 2018-02-23 MED ORDER — PEN NEEDLES 31G X 8 MM MISC: 1.0000 | Freq: Every day | 3 refills | Status: DC

## 2018-02-23 NOTE — Assessment & Plan Note (Addendum)
She was started on Metformin and has titrated up to 500mg  BID, denies GI upset She has been injecting [place] 20 U nightly She reports AM BS 110-130s, Post Prandial BS 160-190s She denies episodes of hypogylcemia Will recheck A1c at f/u in 6 weeks Microalbumin- abnormal  If abnormal at recheck will re-start Losartan

## 2018-02-23 NOTE — Patient Instructions (Addendum)
Diabetes Mellitus and Nutrition When you have diabetes (diabetes mellitus), it is very important to have healthy eating habits because your blood sugar (glucose) levels are greatly affected by what you eat and drink. Eating healthy foods in the appropriate amounts, at about the same times every day, can help you:  Control your blood glucose.  Lower your risk of heart disease.  Improve your blood pressure.  Reach or maintain a healthy weight.  Every person with diabetes is different, and each person has different needs for a meal plan. Your health care provider may recommend that you work with a diet and nutrition specialist (dietitian) to make a meal plan that is best for you. Your meal plan may vary depending on factors such as:  The calories you need.  The medicines you take.  Your weight.  Your blood glucose, blood pressure, and cholesterol levels.  Your activity level.  Other health conditions you have, such as heart or kidney disease.  How do carbohydrates affect me? Carbohydrates affect your blood glucose level more than any other type of food. Eating carbohydrates naturally increases the amount of glucose in your blood. Carbohydrate counting is a method for keeping track of how many carbohydrates you eat. Counting carbohydrates is important to keep your blood glucose at a healthy level, especially if you use insulin or take certain oral diabetes medicines. It is important to know how many carbohydrates you can safely have in each meal. This is different for every person. Your dietitian can help you calculate how many carbohydrates you should have at each meal and for snack. Foods that contain carbohydrates include:  Bread, cereal, rice, pasta, and crackers.  Potatoes and corn.  Peas, beans, and lentils.  Milk and yogurt.  Fruit and juice.  Desserts, such as cakes, cookies, ice cream, and candy.  How does alcohol affect me? Alcohol can cause a sudden decrease in blood  glucose (hypoglycemia), especially if you use insulin or take certain oral diabetes medicines. Hypoglycemia can be a life-threatening condition. Symptoms of hypoglycemia (sleepiness, dizziness, and confusion) are similar to symptoms of having too much alcohol. If your health care provider says that alcohol is safe for you, follow these guidelines:  Limit alcohol intake to no more than 1 drink per day for nonpregnant women and 2 drinks per day for men. One drink equals 12 oz of beer, 5 oz of wine, or 1 oz of hard liquor.  Do not drink on an empty stomach.  Keep yourself hydrated with water, diet soda, or unsweetened iced tea.  Keep in mind that regular soda, juice, and other mixers may contain a lot of sugar and must be counted as carbohydrates.  What are tips for following this plan? Reading food labels  Start by checking the serving size on the label. The amount of calories, carbohydrates, fats, and other nutrients listed on the label are based on one serving of the food. Many foods contain more than one serving per package.  Check the total grams (g) of carbohydrates in one serving. You can calculate the number of servings of carbohydrates in one serving by dividing the total carbohydrates by 15. For example, if a food has 30 g of total carbohydrates, it would be equal to 2 servings of carbohydrates.  Check the number of grams (g) of saturated and trans fats in one serving. Choose foods that have low or no amount of these fats.  Check the number of milligrams (mg) of sodium in one serving. Most people   should limit total sodium intake to less than 2,300 mg per day.  Always check the nutrition information of foods labeled as "low-fat" or "nonfat". These foods may be higher in added sugar or refined carbohydrates and should be avoided.  Talk to your dietitian to identify your daily goals for nutrients listed on the label. Shopping  Avoid buying canned, premade, or processed foods. These  foods tend to be high in fat, sodium, and added sugar.  Shop around the outside edge of the grocery store. This includes fresh fruits and vegetables, bulk grains, fresh meats, and fresh dairy. Cooking  Use low-heat cooking methods, such as baking, instead of high-heat cooking methods like deep frying.  Cook using healthy oils, such as olive, canola, or sunflower oil.  Avoid cooking with butter, cream, or high-fat meats. Meal planning  Eat meals and snacks regularly, preferably at the same times every day. Avoid going long periods of time without eating.  Eat foods high in fiber, such as fresh fruits, vegetables, beans, and whole grains. Talk to your dietitian about how many servings of carbohydrates you can eat at each meal.  Eat 4-6 ounces of lean protein each day, such as lean meat, chicken, fish, eggs, or tofu. 1 ounce is equal to 1 ounce of meat, chicken, or fish, 1 egg, or 1/4 cup of tofu.  Eat some foods each day that contain healthy fats, such as avocado, nuts, seeds, and fish. Lifestyle   Check your blood glucose regularly.  Exercise at least 30 minutes 5 or more days each week, or as told by your health care provider.  Take medicines as told by your health care provider.  Do not use any products that contain nicotine or tobacco, such as cigarettes and e-cigarettes. If you need help quitting, ask your health care provider.  Work with a Social worker or diabetes educator to identify strategies to manage stress and any emotional and social challenges. What are some questions to ask my health care provider?  Do I need to meet with a diabetes educator?  Do I need to meet with a dietitian?  What number can I call if I have questions?  When are the best times to check my blood glucose? Where to find more information:  American Diabetes Association: diabetes.org/food-and-fitness/food  Academy of Nutrition and Dietetics:  PokerClues.dk  [employer] of Diabetes and Digestive and Kidney Diseases (NIH): ContactWire.be Summary  A healthy meal plan will help you control your blood glucose and maintain a healthy lifestyle.  Working with a diet and nutrition specialist (dietitian) can help you make a meal plan that is best for you.  Keep in mind that carbohydrates and alcohol have immediate effects on your blood glucose levels. It is important to count carbohydrates and to use alcohol carefully. This information is not intended to replace advice given to you by your health care provider. Make sure you discuss any questions you have with your health care provider. Document Released: 12/04/2004 Document Revised: 04/13/2016 Document Reviewed: 04/13/2016 Elsevier Interactive Patient Education  2018 [place] all medications as directed. Increase water intake and continue to follow Diabetic diet. Increase daily walking. Continue to check blood sugar daily and start checking blood pressure (2-3 times week). If fasting blood sugar consistently <100 or >140, call clinic. If after meal blood sugar consistently >240, call clinic. If your blood pressure is consistently <100/60 or >140/90, call clinic. Great job on your blood pressure, blood sugar log, and weight loss! Podiatry referral placed.  Follow-up in 6 weeks, sooner if needed. NICE TO SEE YOU!

## 2018-02-23 NOTE — Assessment & Plan Note (Signed)
Continue all medications as directed. Increase water intake and continue to follow Diabetic diet. Increase daily walking. Continue to check blood sugar daily and start checking blood pressure (2-3 times week). If fasting blood sugar consistently <100 or >140, call clinic. If after meal blood sugar consistently >240, call clinic. If your blood pressure is consistently <100/60 or >140/90, call clinic. Great job on your blood pressure, blood sugar log, and weight loss! Podiatry referral placed. Follow-up in 6 weeks, sooner if needed.

## 2018-04-05 NOTE — Progress Notes (Deleted)
Subjective:    Patient ID: Pamela Carney, female    DOB: June 22, 1965, 53 y.o.   MRN: [id number]  HPI: 02/01/18 OV:  Pamela Carney is here to establish as a new pt.  She is a pleasant 53 year old female. PMH: Migraine HA with excessive NSAID use Bipolar- previously on Depakote and Lamictal- urgent referral to Psychiatry placed 12/29/17- Presented to ED with dysuria, found to have acute kidney injury with CKD III, transferred treated in ICU- fluids, IV ABX,  underwent percutaneous placement of a nephrostomy tube managed by IR. Plan is for outpatient follow-up with urology for definitive treatment of stone. Acute kidney injury resolved and she will follow-up with nephrology as an outpatient, to f/u with Dr. Joelyn Oms.  HTN- previously on HCTZ, losartan, bisoprolol She completed course of oral Keflex She has percutaneous nephrolithotomy ureteroscopy/lithitripsy with stent scheduled this Friday 1115/19 and stent exchange 02/07/18 Upon chart review, it was discovered that her A1c was sig elevated She also reports polyuria/polydipsia and 40 lb weight loss over the last yr She was not started on anti-diabetic medications at discharge She reports some dizziness with position changes, BP today 106/74  Lab Results  Component Value Date   HGBA1C 11.3 (H) 12/28/2017   She has not had primary care in >2 years She has not been seen by Behavioral Health >2 years She denies thoughts of harming herself/others and had previously been off all mental health medications for years. She smokes pack/day, >37 years She is married  02/23/18 OV: Pamela Carney is here for f/u: blood pressure and new onset T2D Amlodipine was d/c'd 3 weeks ago, not currently on any anti-hypertensives She reports BP was low Urology earlier in week, SBP in 100s She reports complete resolution of dizziness She was started on Metformin and has titrated up to 533m BID, denies GI upset She has been injecting [place] U nightly She  reports AM BS 110-130s, Post Prandial BS 160-190s She denies episodes of hypogylcemia She underwent nephrolithotomy and cystoscopy with stent exchange 3  weeks ago- and is now followed by [facility] Next [facility] with [facility]/17 She has been reducing sugar/CHO food intake but continues to drink coke and ginger ale. She reports only activity is "walking around my house" She reports bil burning foor pain and "tough/thick toenails" [facility] [place] 04/06/2018 OV: Ms. [place] for f/u: HTN, T2D    Patient Care Team    Relationship Specialty Notifications Start End  DEsaw Grandchild NP PCP - General Family Medicine  02/01/18   MAlexis Frock MD Consulting Physician Urology  02/01/18     Patient Active Problem List   Diagnosis Date Noted  . New onset type 2 diabetes mellitus ([place] 02/01/2018  . Bipolar 1 disorder ([place] 02/01/2018  . IUD (intrauterine device) in place 02/01/2018  . Obesity 02/01/2018  . Healthcare maintenance 02/01/2018  . CKD (chronic kidney disease), stage III ([place] 12/28/2017  . 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 Acute cystitis with hematuria   . Chronic interstitial nephritis 12/24/2017  . Generalized weakness 12/24/2017  . Sepsis ([place] 12/24/2017  . Hypokalemia 12/24/2017  . Oral candidiasis 12/24/2017  . Neuropathy involving both lower extremities 01/31/2014  . Perimenopausal 12/29/2012  . Family planning, IUD (intrauterine device) check/reinsertion/removal 12/28/2012  . Pain 12/27/2012     Past Medical History:  Diagnosis Date  . Abnormal Pap smear   . AKI (acute kidney injury) ([place] 12/28/2017  . Anxiety   . Arthritis  bil knees, neck  . Bipolar 1 disorder ([place])   . Cataract    Mild  . Cholelithiasis 12/23/2017   noted on CT renal, pt unaware  . Chronic kidney disease (CKD), stage III (moderate) ([facility])   . Cystitis   . Genital warts    Hx of genital  . GERD (gastroesophageal reflux disease)   . Heart murmur     childhood  . Hematuria   . History of blood transfusion   . History of ectopic pregnancy   . History of gestational diabetes   . History of kidney stones   . History of sepsis    after ectopic pregnancy  . Hypertension   . Idiopathic peripheral neuropathy    both feet  . Leg ulcer, left ([place])   . Low back pain   . Migraines   . Obesity   . Oral candidiasis   . Pneumonia   . PONV (postoperative nausea and vomiting)    prolonged sedation  . Recurrent UTI   . 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 Sigmoid diverticulosis 12/23/2017   noted on CT renal, pt unaware  . Ulcer of foot ([place])    Left  . Weakness      Past Surgical History:  Procedure Laterality Date  . CERVICAL CONE BIOPSY    . CESAREAN SECTION     x3  . CYSTOSCOPY/URETEROSCOPY/HOLMIUM LASER/STENT PLACEMENT Right 02/04/2018   Procedure: CYSTOSCOPY/URETEROSCOPY/RETROGRADE PYELOGRAM/HOLMIUM LASER/STENT PLACEMENT;  Surgeon: Alexis Frock, MD;  Location: [facility];  Service: Urology;  Laterality: Right;  . CYSTOSCOPY/URETEROSCOPY/HOLMIUM LASER/STENT PLACEMENT Right 02/07/2018   Procedure: CYSTOSCOPY LEFT STENT EXCHANGE;  Surgeon: Alexis Frock, MD;  Location: [facility];  Service: Urology;  Laterality: Right;  . DILATION AND CURETTAGE OF UTERUS    . ECTOPIC PREGNANCY SURGERY    . IR NEPHROSTOMY PLACEMENT LEFT  12/25/2017  . NEPHROLITHOTOMY Left 02/04/2018   Procedure: NEPHROLITHOTOMY PERCUTANEOUS;  Surgeon: Alexis Frock, MD;  Location: [facility];  Service: Urology;  Laterality: Left;  3 HRS  . NEPHROLITHOTOMY Left 02/07/2018   Procedure: SECOND LOOK NEPHROLITHOTOMY PERCUTANEOUS;  Surgeon: Alexis Frock, MD;  Location: [facility];  Service: Urology;  Laterality: Left;  2 HRS  . OVARIAN CYST REMOVAL    . UNILATERAL SALPINGECTOMY     Pt unsure of which fallopian tube was removed  . WISDOM TOOTH EXTRACTION       Family History    Problem Relation Age of Onset  . Diabetes Paternal Grandfather   . COPD Paternal Grandfather   . Heart disease Paternal Grandfather   . Cancer Paternal Grandfather        bone  . Cancer Paternal Grandmother   . Heart disease Paternal Grandmother   . Cancer Father   . Hypertension Father   . Heart attack Father   . Alcohol abuse Father   . Depression Father   . Heart failure Mother   . Diabetes Mother   . Heart disease Mother   . Hyperlipidemia Mother   . Hypertension Mother   . Heart attack Mother   . Peripheral vascular disease Mother   . Depression Mother   . COPD Mother   . Hypertension Sister   . Heart attack Sister   . Stroke Maternal Grandmother      Social History   Substance and Sexual Activity  Drug Use No     Social History   Substance and Sexual  Activity  Alcohol Use No  . Alcohol/week: 0.0 standard drinks     Social History   Tobacco Use  Smoking Status Current Every Day Smoker  . Packs/day: 1.00  . Years: 37.00  . Pack years: 37.00  . Types: Cigarettes  Smokeless Tobacco Never Used     Outpatient Encounter Medications as of 04/06/2018  Medication Sig  . Blood Glucose Monitoring Suppl (ONETOUCH VERIO) w/Device KIT Use to check blood sugars every morning fasting and 2 hours after largest meal  . glucose blood (ONETOUCH VERIO) test strip Use to check blood sugars every morning fasting and 2 hours after largest meal  . insulin degludec (TRESIBA) 100 UNIT/ML SOPN FlexTouch Pen Inject 0.2 mLs (20 Units total) into the skin daily.  . Insulin Pen Needle (PEN NEEDLES) 31G X 8 MM MISC 1 each by Does not apply route daily.  Marland Kitchen levonorgestrel (MIRENA) 20 MCG/24HR IUD 1 each by Intrauterine route once.  . metFORMIN (GLUCOPHAGE) 500 MG tablet Take by mouth 2 (two) times daily with a meal.  . Multiple Vitamins-Minerals (ONE-A-DAY WOMENS 50+ ADVANTAGE) TABS Take 1 tablet by mouth daily.  . ONE TOUCH LANCETS MISC Use to check blood sugars every morning  fasting and 2 hours after largest meal   No facility-administered encounter medications on file as of 04/06/2018.     Allergies: Sulfa antibiotics; Latex; and Tape  There is no height or weight on file to calculate BMI.  There were no vitals taken for this visit.  Review of Systems  Constitutional: Positive for fatigue. Negative for activity change, appetite change, chills, diaphoresis, fever and unexpected weight change.  Respiratory: Negative for cough, chest tightness, shortness of breath, wheezing and stridor.   Cardiovascular: Negative for chest pain, palpitations and leg swelling.  Gastrointestinal: Negative for abdominal distention, abdominal pain, blood in stool, constipation, diarrhea, nausea and vomiting.  Genitourinary: Positive for frequency. Negative for difficulty urinating, dysuria and flank pain.  Musculoskeletal: Positive for arthralgias and back pain.  Skin: Negative for color change, rash and wound.  Neurological: Negative for dizziness and headaches.  Hematological: Does not bruise/bleed easily.  Psychiatric/Behavioral: Positive for sleep disturbance. Negative for agitation, behavioral problems, confusion, decreased concentration, dysphoric mood, hallucinations, self-injury and suicidal ideas. The patient is not nervous/anxious and is not hyperactive.        Objective:   Physical Exam Vitals signs and nursing note reviewed.  Constitutional:      General: She is not in acute distress.    Appearance: She is well-developed. She is not diaphoretic.  HENT:     Head: Normocephalic and atraumatic.     Right Ear: External ear normal.     Left Ear: External ear normal.     Nose: Nose normal.  Eyes:     Conjunctiva/sclera: Conjunctivae normal.     Pupils: Pupils are equal, round, and reactive to light.  Cardiovascular:     Rate and Rhythm: Normal rate and regular rhythm.     Heart sounds: Normal heart sounds.  Pulmonary:     Effort: Pulmonary effort is normal. No  respiratory distress.     Breath sounds: Normal breath sounds. No stridor. No wheezing or rales.  Chest:     Chest wall: No tenderness.  Abdominal:     General: There is no distension.     Palpations: There is no mass.     Tenderness: There is no abdominal tenderness. There is no guarding or rebound.     Hernia: No hernia is present.  Skin:    General: Skin is warm and dry.     Capillary Refill: Capillary refill takes less than 2 seconds.          Comments: Intact L flank linear surgical incision, well approximated with 3 interrupted sutures No drainage noted   Neurological:     Mental Status: She is alert and oriented to person, place, and time.  Psychiatric:        Behavior: Behavior normal.        Thought Content: Thought content normal.        Judgment: Judgment normal.       Assessment & Plan:   No diagnosis found.  No problem-specific Assessment & Plan notes found for this encounter.  If BP rises above goal, will restart losartan  FOLLOW-UP:  No follow-ups on file.

## 2018-04-06 ENCOUNTER — Ambulatory Visit: Payer: 59 | Admitting: Adult Health

## 2018-05-10 ENCOUNTER — Ambulatory Visit: Payer: 59 | Admitting: Adult Health

## 2018-07-18 ENCOUNTER — Telehealth: Payer: Self-pay | Admitting: Adult Health

## 2018-07-18 NOTE — Telephone Encounter (Signed)
-----   Message from Fonnie Mu, [place] sent at 07/13/2018  2:06 PM EDT ----- Regarding: OV needed Please call pt to schedule f/u with [facility].  She was supposed to have returned in January, 2020.  Thanks!

## 2018-07-18 NOTE — Telephone Encounter (Signed)
Called patient to reschedule missed Jan 2020 appt---- left message to call office to set up.  --Forward FYI to Psychologist, sport and exercise.  --Pamela Carney

## 2018-09-05 ENCOUNTER — Other Ambulatory Visit: Payer: Self-pay | Admitting: Adult Health

## 2018-09-05 ENCOUNTER — Telehealth: Payer: Self-pay

## 2018-09-05 NOTE — Telephone Encounter (Signed)
Please call pt to schedule f/u.  No further refills until pt is seen in office.  Charyl Bigger, CMA

## 2018-10-05 ENCOUNTER — Telehealth: Payer: Self-pay | Admitting: Adult Health

## 2018-10-05 NOTE — Telephone Encounter (Signed)
Called patient to establish provider required [place] for Rx refill--- unable to spk w/ Pt, left message for call back.  ---FYI to medical assistant.  --Dion Body

## 2018-10-11 ENCOUNTER — Ambulatory Visit: Payer: 59 | Admitting: Adult Health

## 2018-12-14 ENCOUNTER — Encounter (HOSPITAL_COMMUNITY): Payer: Self-pay | Admitting: Emergency Medicine

## 2018-12-14 ENCOUNTER — Other Ambulatory Visit: Payer: Self-pay

## 2018-12-14 ENCOUNTER — Emergency Department (HOSPITAL_COMMUNITY): Payer: 59

## 2018-12-14 ENCOUNTER — Inpatient Hospital Stay (HOSPITAL_COMMUNITY)
Admission: EM | Admit: 2018-12-14 | Discharge: 2018-12-25 | DRG: 853 | Disposition: A | Discharge status: Home / Self Care | Payer: 59 | Attending: Internal Medicine | Admitting: Internal Medicine

## 2018-12-14 DIAGNOSIS — Z8249 Family history of ischemic heart disease and other diseases of the circulatory system: Secondary | ICD-10-CM

## 2018-12-14 DIAGNOSIS — E860 Dehydration: Secondary | ICD-10-CM

## 2018-12-14 DIAGNOSIS — I700 Atherosclerosis of aorta: Secondary | ICD-10-CM | POA: Diagnosis present

## 2018-12-14 DIAGNOSIS — B373 Candidiasis of vulva and vagina: Secondary | ICD-10-CM | POA: Diagnosis present

## 2018-12-14 DIAGNOSIS — I6523 Occlusion and stenosis of bilateral carotid arteries: Secondary | ICD-10-CM | POA: Diagnosis present

## 2018-12-14 DIAGNOSIS — E1165 Type 2 diabetes mellitus with hyperglycemia: Secondary | ICD-10-CM | POA: Diagnosis present

## 2018-12-14 DIAGNOSIS — I129 Hypertensive chronic kidney disease with stage 1 through stage 4 chronic kidney disease, or unspecified chronic kidney disease: Secondary | ICD-10-CM | POA: Diagnosis present

## 2018-12-14 DIAGNOSIS — Z23 Encounter for immunization: Secondary | ICD-10-CM

## 2018-12-14 DIAGNOSIS — Z823 Family history of stroke: Secondary | ICD-10-CM

## 2018-12-14 DIAGNOSIS — Z960 Presence of urogenital implants: Secondary | ICD-10-CM

## 2018-12-14 DIAGNOSIS — Z419 Encounter for procedure for purposes other than remedying health state, unspecified: Secondary | ICD-10-CM

## 2018-12-14 DIAGNOSIS — A419 Sepsis, unspecified organism: Secondary | ICD-10-CM

## 2018-12-14 DIAGNOSIS — E1142 Type 2 diabetes mellitus with diabetic polyneuropathy: Secondary | ICD-10-CM | POA: Diagnosis present

## 2018-12-14 DIAGNOSIS — Z794 Long term (current) use of insulin: Secondary | ICD-10-CM

## 2018-12-14 DIAGNOSIS — Z1623 Resistance to quinolones and fluoroquinolones: Secondary | ICD-10-CM | POA: Diagnosis present

## 2018-12-14 DIAGNOSIS — N3289 Other specified disorders of bladder: Secondary | ICD-10-CM | POA: Diagnosis present

## 2018-12-14 DIAGNOSIS — N201 Calculus of ureter: Secondary | ICD-10-CM

## 2018-12-14 DIAGNOSIS — Z20828 Contact with and (suspected) exposure to other viral communicable diseases: Secondary | ICD-10-CM | POA: Diagnosis present

## 2018-12-14 DIAGNOSIS — N183 Chronic kidney disease, stage 3 unspecified: Secondary | ICD-10-CM | POA: Diagnosis present

## 2018-12-14 DIAGNOSIS — Z975 Presence of (intrauterine) contraceptive device: Secondary | ICD-10-CM

## 2018-12-14 DIAGNOSIS — Z91040 Latex allergy status: Secondary | ICD-10-CM

## 2018-12-14 DIAGNOSIS — R6521 Severe sepsis with septic shock: Secondary | ICD-10-CM | POA: Diagnosis present

## 2018-12-14 DIAGNOSIS — Z452 Encounter for adjustment and management of vascular access device: Secondary | ICD-10-CM

## 2018-12-14 DIAGNOSIS — N136 Pyonephrosis: Secondary | ICD-10-CM | POA: Diagnosis present

## 2018-12-14 DIAGNOSIS — H538 Other visual disturbances: Secondary | ICD-10-CM | POA: Diagnosis present

## 2018-12-14 DIAGNOSIS — H53461 Homonymous bilateral field defects, right side: Secondary | ICD-10-CM | POA: Diagnosis present

## 2018-12-14 DIAGNOSIS — Z8349 Family history of other endocrine, nutritional and metabolic diseases: Secondary | ICD-10-CM

## 2018-12-14 DIAGNOSIS — R29702 NIHSS score 2: Secondary | ICD-10-CM | POA: Diagnosis present

## 2018-12-14 DIAGNOSIS — I701 Atherosclerosis of renal artery: Secondary | ICD-10-CM | POA: Diagnosis present

## 2018-12-14 DIAGNOSIS — I639 Cerebral infarction, unspecified: Secondary | ICD-10-CM | POA: Insufficient documentation

## 2018-12-14 DIAGNOSIS — Z833 Family history of diabetes mellitus: Secondary | ICD-10-CM

## 2018-12-14 DIAGNOSIS — I6349 Cerebral infarction due to embolism of other cerebral artery: Secondary | ICD-10-CM

## 2018-12-14 DIAGNOSIS — F17210 Nicotine dependence, cigarettes, uncomplicated: Secondary | ICD-10-CM | POA: Diagnosis present

## 2018-12-14 DIAGNOSIS — A4151 Sepsis due to Escherichia coli [E. coli]: Principal | ICD-10-CM | POA: Diagnosis present

## 2018-12-14 DIAGNOSIS — I6340 Cerebral infarction due to embolism of unspecified cerebral artery: Secondary | ICD-10-CM | POA: Insufficient documentation

## 2018-12-14 DIAGNOSIS — D539 Nutritional anemia, unspecified: Secondary | ICD-10-CM | POA: Diagnosis present

## 2018-12-14 DIAGNOSIS — Z825 Family history of asthma and other chronic lower respiratory diseases: Secondary | ICD-10-CM

## 2018-12-14 DIAGNOSIS — G4733 Obstructive sleep apnea (adult) (pediatric): Secondary | ICD-10-CM | POA: Diagnosis present

## 2018-12-14 DIAGNOSIS — Z6841 Body Mass Index (BMI) 40.0 and over, adult: Secondary | ICD-10-CM

## 2018-12-14 DIAGNOSIS — E1122 Type 2 diabetes mellitus with diabetic chronic kidney disease: Secondary | ICD-10-CM | POA: Diagnosis present

## 2018-12-14 DIAGNOSIS — Z1611 Resistance to penicillins: Secondary | ICD-10-CM | POA: Diagnosis present

## 2018-12-14 DIAGNOSIS — A4181 Sepsis due to Enterococcus: Secondary | ICD-10-CM | POA: Diagnosis present

## 2018-12-14 DIAGNOSIS — N132 Hydronephrosis with renal and ureteral calculous obstruction: Secondary | ICD-10-CM

## 2018-12-14 DIAGNOSIS — K219 Gastro-esophageal reflux disease without esophagitis: Secondary | ICD-10-CM | POA: Diagnosis present

## 2018-12-14 DIAGNOSIS — N179 Acute kidney failure, unspecified: Secondary | ICD-10-CM

## 2018-12-14 DIAGNOSIS — Z882 Allergy status to sulfonamides status: Secondary | ICD-10-CM

## 2018-12-14 DIAGNOSIS — I63432 Cerebral infarction due to embolism of left posterior cerebral artery: Secondary | ICD-10-CM | POA: Diagnosis present

## 2018-12-14 HISTORY — DX: Type 2 diabetes mellitus without complications: E11.9

## 2018-12-14 LAB — CBC
HCT: 42.2 % (ref 36.0–46.0)
Hemoglobin: 14.4 g/dL (ref 12.0–15.0)
MCH: 33.1 pg (ref 26.0–34.0)
MCHC: 34.1 g/dL (ref 30.0–36.0)
MCV: 97 fL (ref 80.0–100.0)
Platelets: 221 10*3/uL (ref 150–400)
RBC: 4.35 MIL/uL (ref 3.87–5.11)
RDW: 14.9 % (ref 11.5–15.5)
WBC: 12.5 10*3/uL — ABNORMAL HIGH (ref 4.0–10.5)
nRBC: 0 % (ref 0.0–0.2)

## 2018-12-14 LAB — URINALYSIS, ROUTINE W REFLEX MICROSCOPIC
Bilirubin Urine: NEGATIVE
Glucose, UA: NEGATIVE mg/dL
Ketones, ur: NEGATIVE mg/dL
Nitrite: NEGATIVE
Protein, ur: 100 mg/dL — AB
Specific Gravity, Urine: 1.016 (ref 1.005–1.030)
pH: 6 (ref 5.0–8.0)

## 2018-12-14 LAB — COMPREHENSIVE METABOLIC PANEL
ALT: 22 U/L (ref 0–44)
AST: 32 U/L (ref 15–41)
Albumin: 2.6 g/dL — ABNORMAL LOW (ref 3.5–5.0)
Alkaline Phosphatase: 109 U/L (ref 38–126)
Anion gap: 15 (ref 5–15)
BUN: 42 mg/dL — ABNORMAL HIGH (ref 6–20)
CO2: 22 mmol/L (ref 22–32)
Calcium: 9.5 mg/dL (ref 8.9–10.3)
Chloride: 92 mmol/L — ABNORMAL LOW (ref 98–111)
Creatinine, Ser: 2.45 mg/dL — ABNORMAL HIGH (ref 0.44–1.00)
GFR calc Af Amer: 25 mL/min — ABNORMAL LOW (ref >60)
GFR calc non Af Amer: 22 mL/min — ABNORMAL LOW (ref >60)
Glucose, Bld: 245 mg/dL — ABNORMAL HIGH (ref 70–99)
Potassium: 5.3 mmol/L — ABNORMAL HIGH (ref 3.5–5.1)
Sodium: 129 mmol/L — ABNORMAL LOW (ref 135–145)
Total Bilirubin: 1.1 mg/dL (ref 0.3–1.2)
Total Protein: 7.1 g/dL (ref 6.5–8.1)

## 2018-12-14 LAB — I-STAT BETA HCG BLOOD, ED (MC, WL, AP ONLY): I-stat hCG, quantitative: 63.1 m[IU]/mL — ABNORMAL HIGH (ref <5)

## 2018-12-14 LAB — LIPASE, BLOOD: Lipase: 25 U/L (ref 11–51)

## 2018-12-14 IMAGING — DX DG CHEST 1V PORT
1 series · 1 of 1 positions shown · non-contrast
Comparison: [DATE]

CLINICAL DATA: Vomiting

EXAM:
PORTABLE CHEST 1 VIEW

[chest]
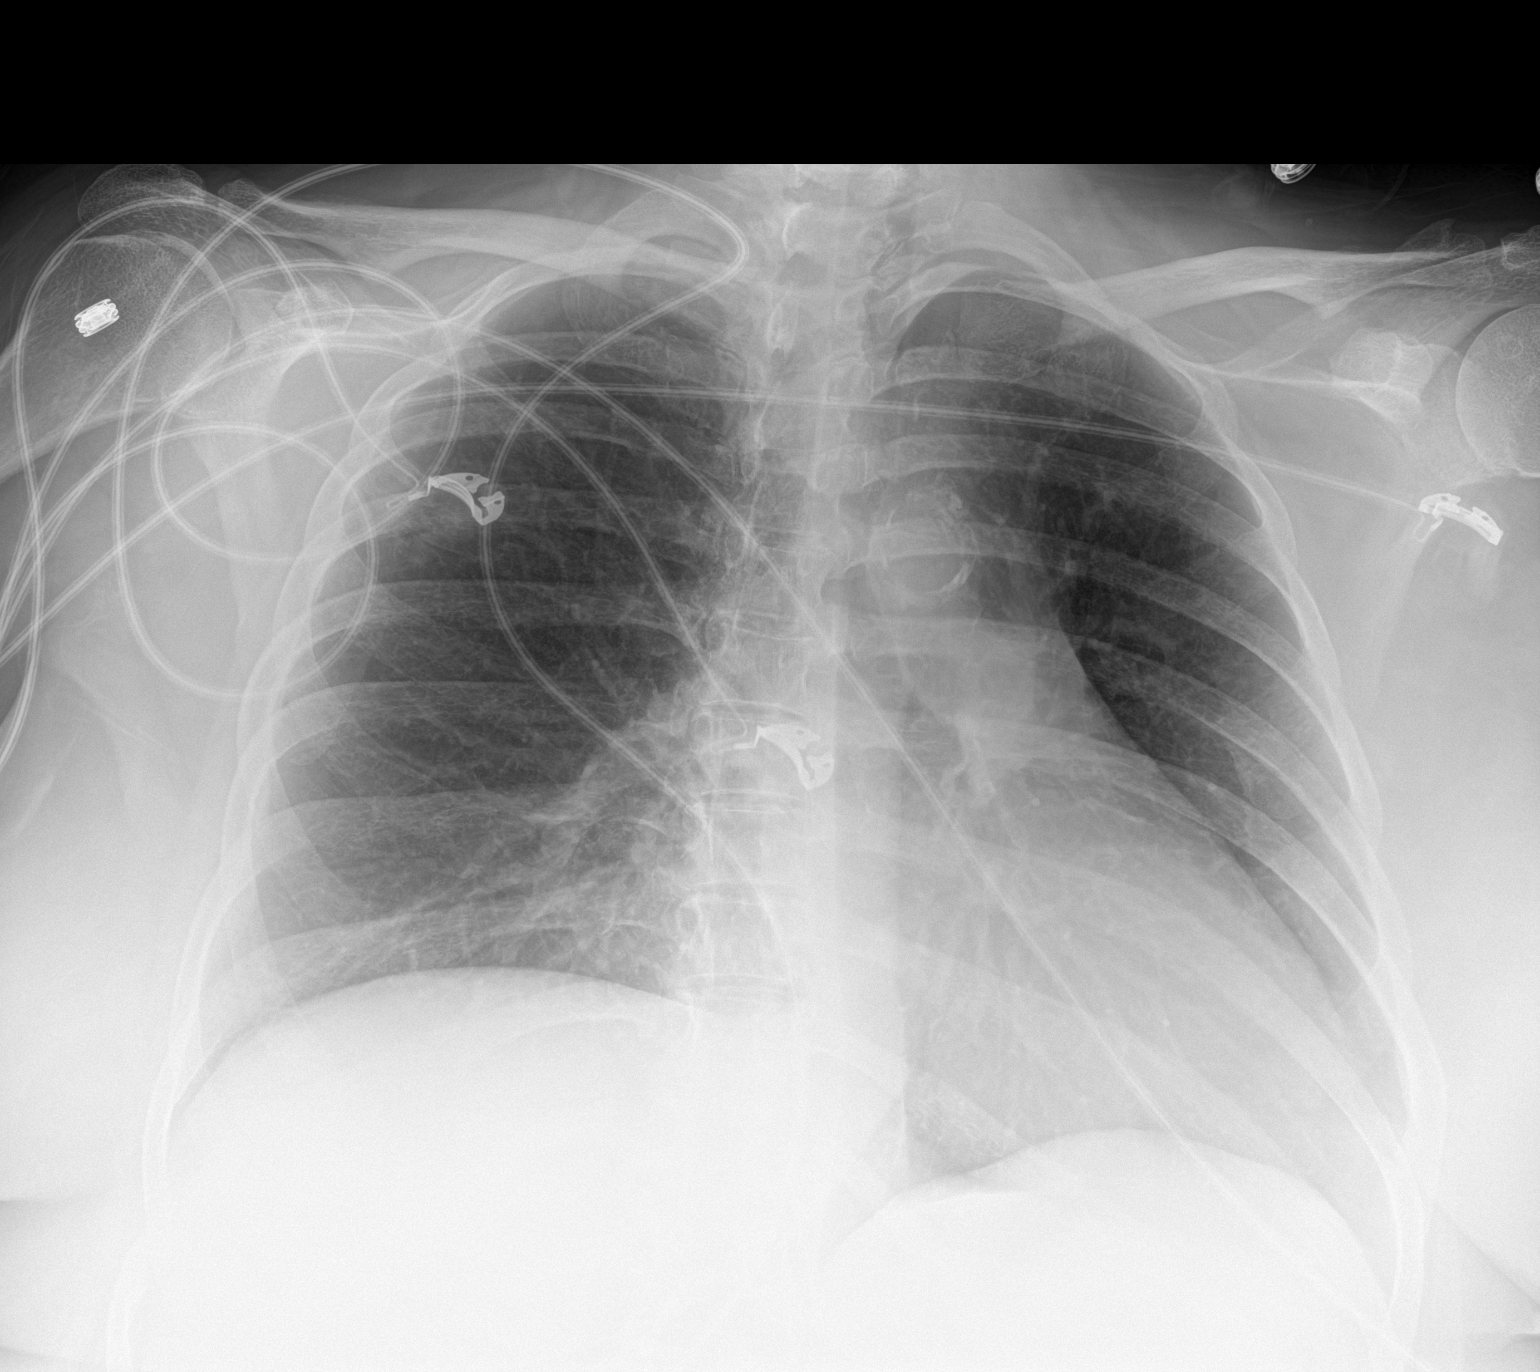

[1 of 1 positions shown; findings below may reference images not displayed]

FINDINGS: Heart is borderline in size. Lungs clear. No effusions or edema.
Aortic atherosclerosis. No acute bony abnormality.
IMPRESSION: Borderline heart size.  No active disease.

## 2018-12-14 MED ORDER — ONDANSETRON 4 MG PO TBDP
4.0000 mg | ORAL_TABLET | Freq: Once | ORAL | Status: AC
  Administered 2018-12-14: 4 mg via ORAL
  Filled 2018-12-14: qty 1

## 2018-12-14 MED ORDER — SODIUM CHLORIDE 0.9% FLUSH: 3.0000 mL | Freq: Once | INTRAVENOUS | Status: DC

## 2018-12-14 MED ORDER — LACTATED RINGERS IV BOLUS (SEPSIS)
1000.0000 mL | Freq: Once | INTRAVENOUS | Status: AC
  Administered 2018-12-15: 1000 mL via INTRAVENOUS

## 2018-12-14 MED ORDER — LACTATED RINGERS IV BOLUS (SEPSIS)
500.0000 mL | Freq: Once | INTRAVENOUS | Status: AC
  Administered 2018-12-15: 500 mL via INTRAVENOUS

## 2018-12-14 MED ORDER — SODIUM CHLORIDE 0.9 % IV SOLN
1.0000 g | INTRAVENOUS | Status: DC
  Administered 2018-12-15: 1 g via INTRAVENOUS
  Filled 2018-12-14 (×2): qty 10

## 2018-12-14 NOTE — ED Triage Notes (Signed)
Patient reports low abdominal pain with emesis , diarrhea , low back pain , fever and headache for several days .

## 2018-12-14 NOTE — ED Notes (Signed)
Called pt for vitals recheck x1 with no response.

## 2018-12-14 NOTE — ED Notes (Signed)
Pt feeling worse. Vitals re-checked. BP ~70/40 range x4 automatic cuffs. Taken on both arms and two different cuff sizes. Verified with manual BP. RN Santiago Glad notified.

## 2018-12-15 ENCOUNTER — Encounter (HOSPITAL_COMMUNITY)
Admission: EM | Disposition: A | Discharge status: Home / Self Care | Payer: Self-pay | Source: Home / Self Care | Attending: Internal Medicine

## 2018-12-15 ENCOUNTER — Emergency Department (HOSPITAL_COMMUNITY): Payer: 59

## 2018-12-15 ENCOUNTER — Inpatient Hospital Stay (HOSPITAL_COMMUNITY): Payer: 59 | Admitting: Certified Registered"

## 2018-12-15 ENCOUNTER — Encounter (HOSPITAL_COMMUNITY): Payer: Self-pay | Admitting: *Deleted

## 2018-12-15 ENCOUNTER — Inpatient Hospital Stay (HOSPITAL_COMMUNITY): Payer: 59

## 2018-12-15 DIAGNOSIS — A4151 Sepsis due to Escherichia coli [E. coli]: Secondary | ICD-10-CM | POA: Diagnosis present

## 2018-12-15 DIAGNOSIS — R29702 NIHSS score 2: Secondary | ICD-10-CM | POA: Diagnosis present

## 2018-12-15 DIAGNOSIS — Z23 Encounter for immunization: Secondary | ICD-10-CM | POA: Diagnosis not present

## 2018-12-15 DIAGNOSIS — H538 Other visual disturbances: Secondary | ICD-10-CM | POA: Diagnosis present

## 2018-12-15 DIAGNOSIS — A419 Sepsis, unspecified organism: Secondary | ICD-10-CM | POA: Diagnosis not present

## 2018-12-15 DIAGNOSIS — G463 Brain stem stroke syndrome: Secondary | ICD-10-CM | POA: Diagnosis not present

## 2018-12-15 DIAGNOSIS — N183 Chronic kidney disease, stage 3 unspecified: Secondary | ICD-10-CM | POA: Insufficient documentation

## 2018-12-15 DIAGNOSIS — Z6841 Body Mass Index (BMI) 40.0 and over, adult: Secondary | ICD-10-CM | POA: Diagnosis not present

## 2018-12-15 DIAGNOSIS — E1122 Type 2 diabetes mellitus with diabetic chronic kidney disease: Secondary | ICD-10-CM | POA: Diagnosis present

## 2018-12-15 DIAGNOSIS — Z1611 Resistance to penicillins: Secondary | ICD-10-CM | POA: Diagnosis present

## 2018-12-15 DIAGNOSIS — F17210 Nicotine dependence, cigarettes, uncomplicated: Secondary | ICD-10-CM | POA: Diagnosis present

## 2018-12-15 DIAGNOSIS — B373 Candidiasis of vulva and vagina: Secondary | ICD-10-CM | POA: Diagnosis present

## 2018-12-15 DIAGNOSIS — R6521 Severe sepsis with septic shock: Secondary | ICD-10-CM

## 2018-12-15 DIAGNOSIS — N132 Hydronephrosis with renal and ureteral calculous obstruction: Secondary | ICD-10-CM | POA: Diagnosis not present

## 2018-12-15 DIAGNOSIS — I63432 Cerebral infarction due to embolism of left posterior cerebral artery: Secondary | ICD-10-CM | POA: Diagnosis present

## 2018-12-15 DIAGNOSIS — Z20828 Contact with and (suspected) exposure to other viral communicable diseases: Secondary | ICD-10-CM | POA: Diagnosis present

## 2018-12-15 DIAGNOSIS — N390 Urinary tract infection, site not specified: Secondary | ICD-10-CM | POA: Diagnosis not present

## 2018-12-15 DIAGNOSIS — I639 Cerebral infarction, unspecified: Secondary | ICD-10-CM | POA: Diagnosis not present

## 2018-12-15 DIAGNOSIS — E1165 Type 2 diabetes mellitus with hyperglycemia: Secondary | ICD-10-CM | POA: Diagnosis present

## 2018-12-15 DIAGNOSIS — H53461 Homonymous bilateral field defects, right side: Secondary | ICD-10-CM | POA: Diagnosis present

## 2018-12-15 DIAGNOSIS — N3289 Other specified disorders of bladder: Secondary | ICD-10-CM | POA: Diagnosis present

## 2018-12-15 DIAGNOSIS — E860 Dehydration: Secondary | ICD-10-CM | POA: Diagnosis present

## 2018-12-15 DIAGNOSIS — R7881 Bacteremia: Secondary | ICD-10-CM | POA: Diagnosis not present

## 2018-12-15 DIAGNOSIS — I6523 Occlusion and stenosis of bilateral carotid arteries: Secondary | ICD-10-CM | POA: Diagnosis not present

## 2018-12-15 DIAGNOSIS — I129 Hypertensive chronic kidney disease with stage 1 through stage 4 chronic kidney disease, or unspecified chronic kidney disease: Secondary | ICD-10-CM | POA: Diagnosis present

## 2018-12-15 DIAGNOSIS — D539 Nutritional anemia, unspecified: Secondary | ICD-10-CM | POA: Diagnosis present

## 2018-12-15 DIAGNOSIS — N136 Pyonephrosis: Secondary | ICD-10-CM | POA: Diagnosis present

## 2018-12-15 DIAGNOSIS — I700 Atherosclerosis of aorta: Secondary | ICD-10-CM | POA: Diagnosis present

## 2018-12-15 DIAGNOSIS — N179 Acute kidney failure, unspecified: Secondary | ICD-10-CM

## 2018-12-15 DIAGNOSIS — Z1623 Resistance to quinolones and fluoroquinolones: Secondary | ICD-10-CM | POA: Diagnosis present

## 2018-12-15 DIAGNOSIS — I6389 Other cerebral infarction: Secondary | ICD-10-CM | POA: Diagnosis not present

## 2018-12-15 HISTORY — PX: CYSTOSCOPY W/ URETERAL STENT PLACEMENT: SHX1429

## 2018-12-15 LAB — CBC
HCT: 39.3 % (ref 36.0–46.0)
Hemoglobin: 12.5 g/dL (ref 12.0–15.0)
MCH: 31.9 pg (ref 26.0–34.0)
MCHC: 31.8 g/dL (ref 30.0–36.0)
MCV: 100.3 fL — ABNORMAL HIGH (ref 80.0–100.0)
Platelets: 187 10*3/uL (ref 150–400)
RBC: 3.92 MIL/uL (ref 3.87–5.11)
RDW: 14.9 % (ref 11.5–15.5)
WBC: 8.6 10*3/uL (ref 4.0–10.5)
nRBC: 0 % (ref 0.0–0.2)

## 2018-12-15 LAB — CBC WITH DIFFERENTIAL/PLATELET
Abs Immature Granulocytes: 0.11 10*3/uL — ABNORMAL HIGH (ref 0.00–0.07)
Basophils Absolute: 0 10*3/uL (ref 0.0–0.1)
Basophils Relative: 0 %
Eosinophils Absolute: 0 10*3/uL (ref 0.0–0.5)
Eosinophils Relative: 0 %
HCT: 34.6 % — ABNORMAL LOW (ref 36.0–46.0)
Hemoglobin: 11.8 g/dL — ABNORMAL LOW (ref 12.0–15.0)
Immature Granulocytes: 1 %
Lymphocytes Relative: 4 %
Lymphs Abs: 0.6 10*3/uL — ABNORMAL LOW (ref 0.7–4.0)
MCH: 32.7 pg (ref 26.0–34.0)
MCHC: 34.1 g/dL (ref 30.0–36.0)
MCV: 95.8 fL (ref 80.0–100.0)
Monocytes Absolute: 0.9 10*3/uL (ref 0.1–1.0)
Monocytes Relative: 7 %
Neutro Abs: 12.4 10*3/uL — ABNORMAL HIGH (ref 1.7–7.7)
Neutrophils Relative %: 88 %
Platelets: 207 10*3/uL (ref 150–400)
RBC: 3.61 MIL/uL — ABNORMAL LOW (ref 3.87–5.11)
RDW: 14.9 % (ref 11.5–15.5)
WBC: 14.1 10*3/uL — ABNORMAL HIGH (ref 4.0–10.5)
nRBC: 0 % (ref 0.0–0.2)

## 2018-12-15 LAB — POCT I-STAT 7, (LYTES, BLD GAS, ICA,H+H)
Acid-base deficit: 1 mmol/L (ref 0.0–2.0)
Bicarbonate: 23.5 mmol/L (ref 20.0–28.0)
Bicarbonate: 23.9 mmol/L (ref 20.0–28.0)
Bicarbonate: 23.6 mmol/L (ref 20.0–28.0)
Calcium, Ion: 1.21 mmol/L (ref 1.15–1.40)
Calcium, Ion: 1.19 mmol/L (ref 1.15–1.40)
Calcium, Ion: 1.18 mmol/L (ref 1.15–1.40)
HCT: 36 % (ref 36.0–46.0)
HCT: 34 % — ABNORMAL LOW (ref 36.0–46.0)
HCT: 34 % — ABNORMAL LOW (ref 36.0–46.0)
Hemoglobin: 12.2 g/dL (ref 12.0–15.0)
Hemoglobin: 11.6 g/dL — ABNORMAL LOW (ref 12.0–15.0)
Hemoglobin: 11.6 g/dL — ABNORMAL LOW (ref 12.0–15.0)
O2 Saturation: 96 %
O2 Saturation: 99 %
O2 Saturation: 94 %
Patient temperature: 100
Patient temperature: 39.5
Patient temperature: 100.6
Potassium: 3.9 mmol/L (ref 3.5–5.1)
Potassium: 4.3 mmol/L (ref 3.5–5.1)
Potassium: 4 mmol/L (ref 3.5–5.1)
Sodium: 133 mmol/L — ABNORMAL LOW (ref 135–145)
Sodium: 133 mmol/L — ABNORMAL LOW (ref 135–145)
Sodium: 134 mmol/L — ABNORMAL LOW (ref 135–145)
TCO2: 24 mmol/L (ref 22–32)
TCO2: 25 mmol/L (ref 22–32)
TCO2: 25 mmol/L (ref 22–32)
pCO2 arterial: 35.4 mmHg (ref 32.0–48.0)
pCO2 arterial: 46.1 mmHg (ref 32.0–48.0)
pCO2 arterial: 37.6 mmHg (ref 32.0–48.0)
pH, Arterial: 7.433 (ref 7.350–7.450)
pH, Arterial: 7.334 — ABNORMAL LOW (ref 7.350–7.450)
pH, Arterial: 7.41 (ref 7.350–7.450)
pO2, Arterial: 80 mmHg — ABNORMAL LOW (ref 83.0–108.0)
pO2, Arterial: 136 mmHg — ABNORMAL HIGH (ref 83.0–108.0)
pO2, Arterial: 73 mmHg — ABNORMAL LOW (ref 83.0–108.0)

## 2018-12-15 LAB — BLOOD CULTURE ID PANEL (REFLEXED)

## 2018-12-15 LAB — LACTIC ACID, PLASMA
Lactic Acid, Venous: 2.2 mmol/L (ref 0.5–1.9)
Lactic Acid, Venous: 3.4 mmol/L (ref 0.5–1.9)

## 2018-12-15 LAB — BASIC METABOLIC PANEL
Anion gap: 12 (ref 5–15)
BUN: 34 mg/dL — ABNORMAL HIGH (ref 6–20)
CO2: 22 mmol/L (ref 22–32)
Calcium: 7.8 mg/dL — ABNORMAL LOW (ref 8.9–10.3)
Chloride: 99 mmol/L (ref 98–111)
Creatinine, Ser: 1.87 mg/dL — ABNORMAL HIGH (ref 0.44–1.00)
GFR calc Af Amer: 35 mL/min — ABNORMAL LOW (ref >60)
GFR calc non Af Amer: 30 mL/min — ABNORMAL LOW (ref >60)
Glucose, Bld: 198 mg/dL — ABNORMAL HIGH (ref 70–99)
Potassium: 3.8 mmol/L (ref 3.5–5.1)
Sodium: 133 mmol/L — ABNORMAL LOW (ref 135–145)

## 2018-12-15 LAB — MAGNESIUM: Magnesium: 2.1 mg/dL (ref 1.7–2.4)

## 2018-12-15 LAB — GLUCOSE, CAPILLARY
Glucose-Capillary: 194 mg/dL — ABNORMAL HIGH (ref 70–99)
Glucose-Capillary: 212 mg/dL — ABNORMAL HIGH (ref 70–99)
Glucose-Capillary: 359 mg/dL — ABNORMAL HIGH (ref 70–99)
Glucose-Capillary: 282 mg/dL — ABNORMAL HIGH (ref 70–99)
Glucose-Capillary: 211 mg/dL — ABNORMAL HIGH (ref 70–99)

## 2018-12-15 LAB — LIPID PANEL
Cholesterol: 183 mg/dL (ref 0–200)
HDL: 26 mg/dL — ABNORMAL LOW (ref >40)
LDL Cholesterol: 99 mg/dL (ref 0–99)
Total CHOL/HDL Ratio: 7 RATIO
Triglycerides: 288 mg/dL — ABNORMAL HIGH (ref <150)
VLDL: 58 mg/dL — ABNORMAL HIGH (ref 0–40)

## 2018-12-15 LAB — MRSA PCR SCREENING: MRSA by PCR: NEGATIVE

## 2018-12-15 LAB — HEMOGLOBIN A1C
Hgb A1c MFr Bld: 8.6 % — ABNORMAL HIGH (ref 4.8–5.6)
Mean Plasma Glucose: 200.12 mg/dL

## 2018-12-15 LAB — PHOSPHORUS: Phosphorus: 3.7 mg/dL (ref 2.5–4.6)

## 2018-12-15 LAB — CBG MONITORING, ED: Glucose-Capillary: 203 mg/dL — ABNORMAL HIGH (ref 70–99)

## 2018-12-15 LAB — PROTIME-INR
INR: 1.1 (ref 0.8–1.2)
Prothrombin Time: 14.4 seconds (ref 11.4–15.2)

## 2018-12-15 LAB — ECHOCARDIOGRAM COMPLETE
Height: 62 in
Weight: 3573.22 oz

## 2018-12-15 LAB — SARS CORONAVIRUS 2 BY RT PCR (HOSPITAL ORDER, PERFORMED IN ~~LOC~~ HOSPITAL LAB): SARS Coronavirus 2: NEGATIVE

## 2018-12-15 LAB — CREATININE, SERUM
Creatinine, Ser: 2.21 mg/dL — ABNORMAL HIGH (ref 0.44–1.00)
GFR calc Af Amer: 29 mL/min — ABNORMAL LOW (ref >60)
GFR calc non Af Amer: 25 mL/min — ABNORMAL LOW (ref >60)

## 2018-12-15 IMAGING — CT CT RENAL STONE PROTOCOL
2 of 4 series · 15 of 46 positions shown, 17 images · non-contrast
Comparison: Prior CT from [DATE].

CLINICAL DATA: Initial evaluation for acute flank pain, recurrent
stone disease suspected.

EXAM:
CT ABDOMEN AND PELVIS WITHOUT CONTRAST
TECHNIQUE: Multidetector CT imaging of the abdomen and pelvis was performed
following the standard protocol without IV contrast.

[Series 3: renal stone 5.0 · axial · 0.98mm/px · z∈[+732,+1137]mm · 12 of 97 slices shown, 14 images]
[im 8/97  soft-tissue]
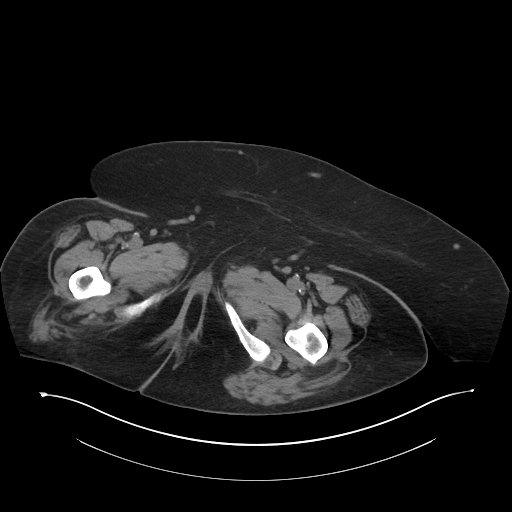
[im 8/97  bone]
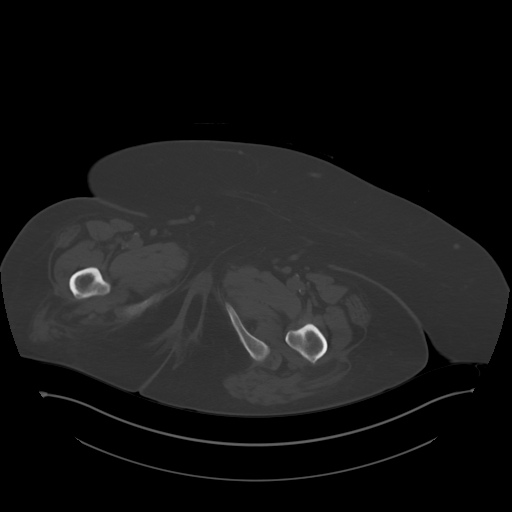
[im 15/97  soft-tissue]
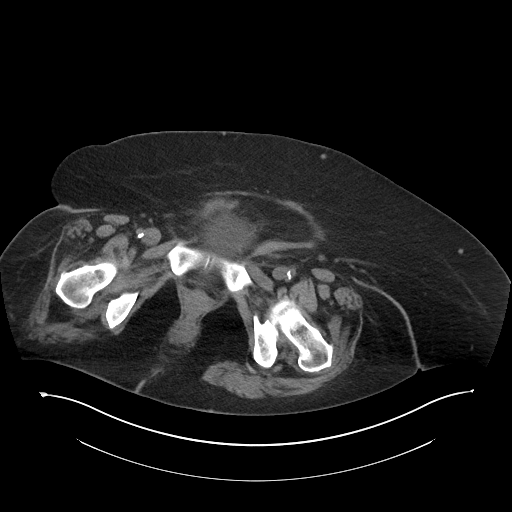
[im 23/97  soft-tissue]
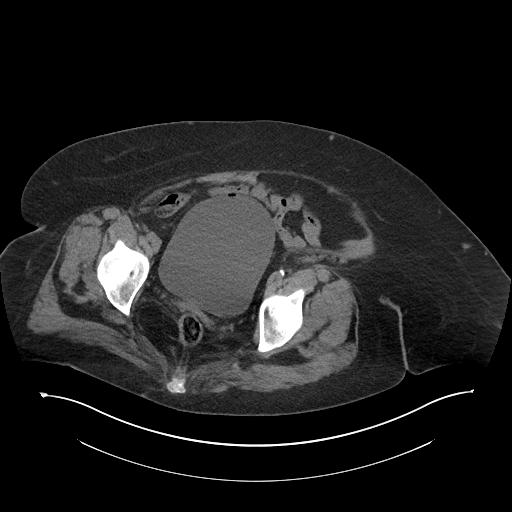
[im 30/97  soft-tissue]
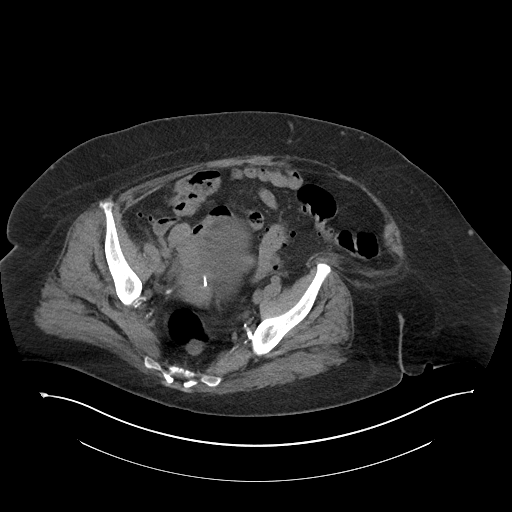
[im 37/97  soft-tissue]
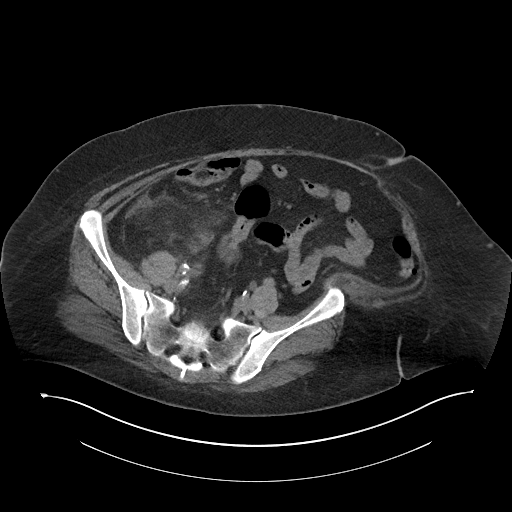
[im 45/97  soft-tissue]
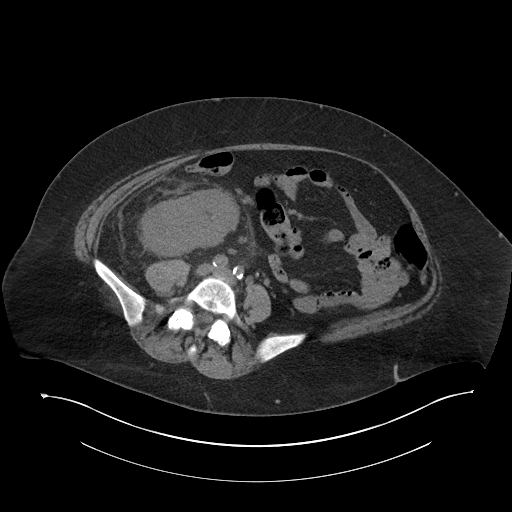
[im 52/97  soft-tissue]
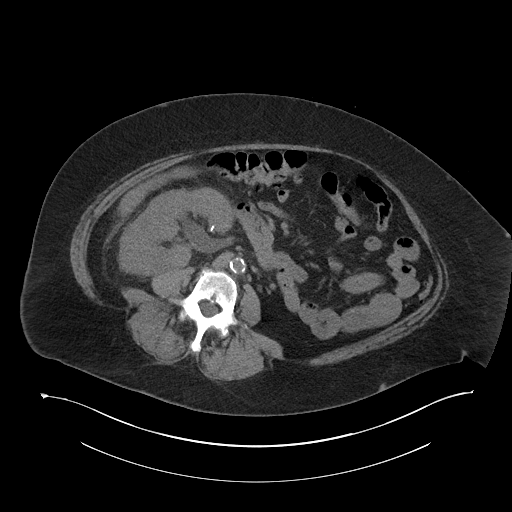
[im 60/97  soft-tissue]
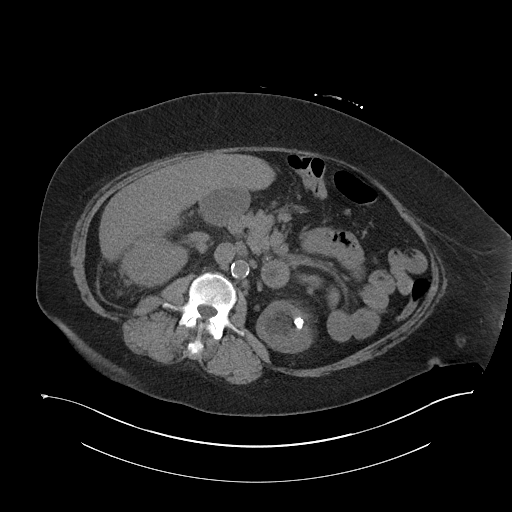
[im 67/97  soft-tissue]
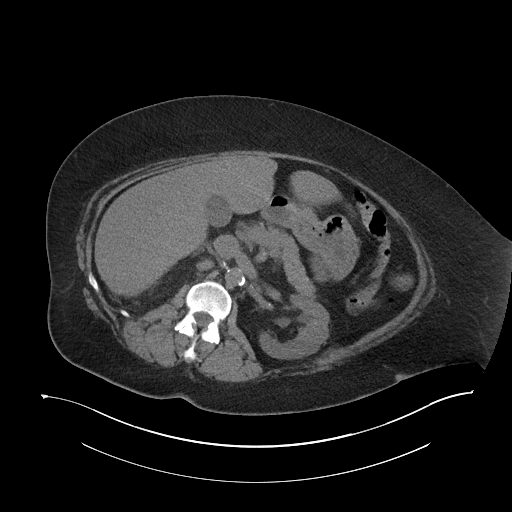
[im 67/97  bone]
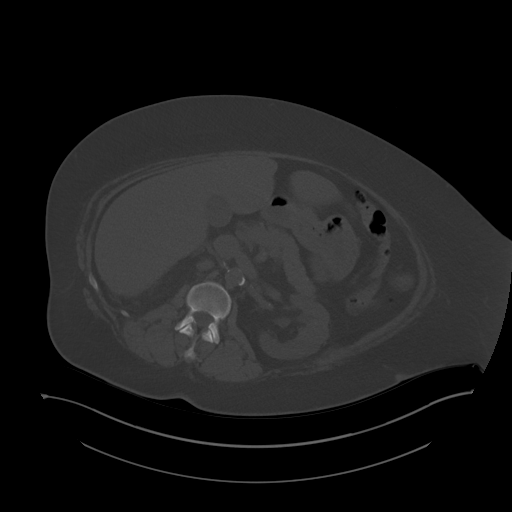
[im 74/97  soft-tissue]
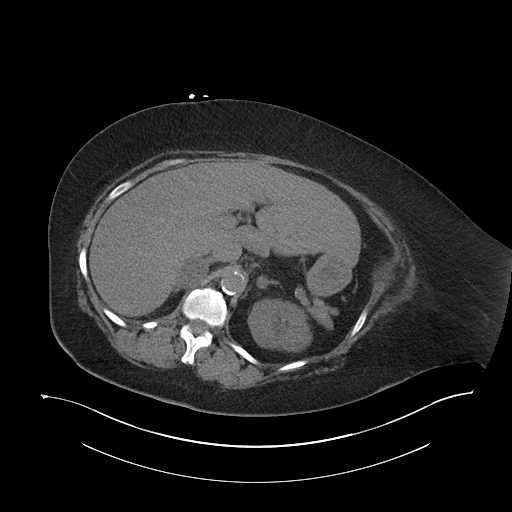
[im 82/97  soft-tissue]
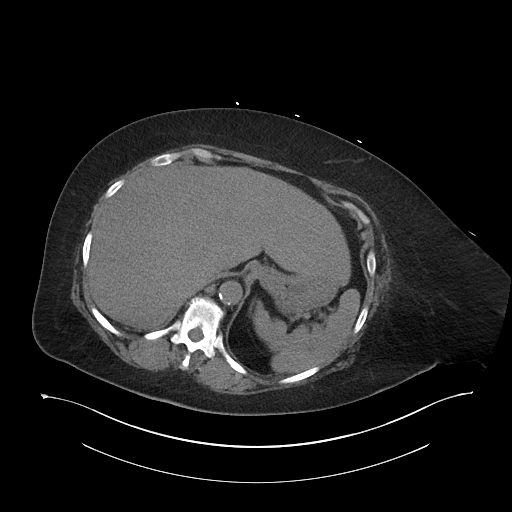
[im 89/97  soft-tissue]
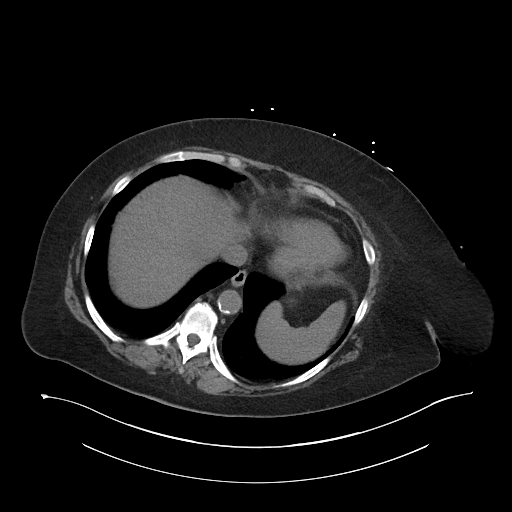

[Series 6: renal stone 3.0 cor · coronal · 0.95mm/px · 3 of 106 slices shown]
[im 36/106  soft-tissue]
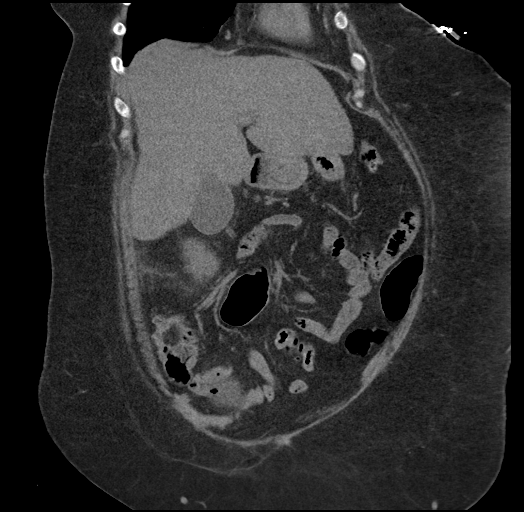
[im 47/106  soft-tissue]
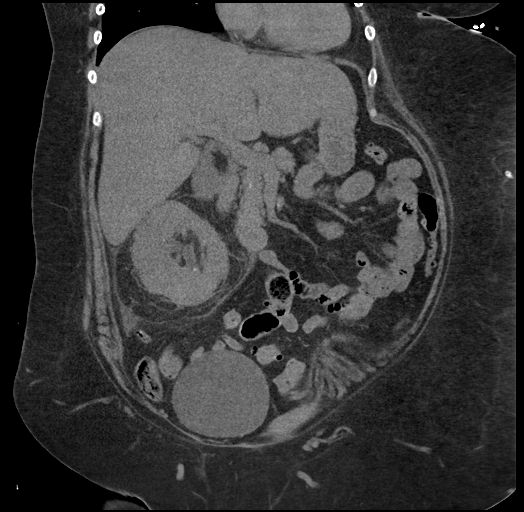
[im 59/106  soft-tissue]
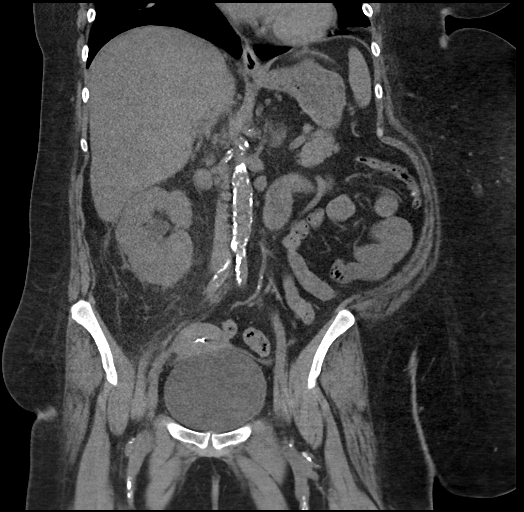

[15 of 46 positions shown; findings below may reference images not displayed]

FINDINGS: Lower chest: Mild scattered subsegmental atelectatic changes present
within the lung bases. Visualized lungs are otherwise clear.

Hepatobiliary: Limited noncontrast evaluation liver is unremarkable.
Gallbladder within normal limits. No biliary dilatation.

Pancreas: Few punctate calcifications seen at the pancreatic head,
suggesting sequelae of chronic pancreatitis. Pancreas otherwise
unremarkable without acute inflammatory changes.

Spleen: Spleen within normal limits.

Adrenals/Urinary Tract: Left adrenal gland normal. 3.1 cm right
adrenal adenoma, similar to previous.

Right kidney asymmetrically enlarged as compared to the left. 10 mm
calcific density overlying the region of the distal right ureter
felt to be most consistent with an obstructive stone. Secondary
moderate right hydroureteronephrosis with prominent perinephric and
periureteral fat stranding. Additional nonobstructive calculi
present within the right kidney, largest of which measures 5-6 mm at
the lower pole.

3 mm nonobstructive calculus present within the interpolar left
kidney. No obstructive calculi seen along the course of the left
renal collecting system with no left-sided hydronephrosis or
hydroureter. Focal cortical scarring with parenchymal calcification
noted at the left kidney. Layering calcification noted within the
calyceal diverticulum at the lower pole.

Partially distended bladder within normal limits. No layering stones
within the bladder lumen.

Stomach/Bowel: Stomach within normal limits. No evidence for bowel
obstruction. Negative appendix. Colonic diverticulosis without
evidence for acute diverticulitis. No acute inflammatory changes
seen about the bowels.

Vascular/Lymphatic: Moderate aorto bi-iliac atherosclerotic disease.
No aneurysm.

Few scattered periaortic and aortocaval nodes noted within the upper
abdomen, measuring up to 13 mm (series 3, image 34, 35, 39), mildly
increased from previous. No other adenopathy.

Reproductive: IUD in place within the uterus. Uterus and ovaries
otherwise unremarkable.

Other: Small fat containing paraumbilical hernia without associated
inflammation. No free air or fluid.

Musculoskeletal: No acute osseous finding. No discrete lytic or
blastic osseous lesions. Multilevel facet arthropathy noted within
the lower lumbar spine.
IMPRESSION: 1. 10 mm obstructive stone within the distal right ureter with
secondary moderate left hydroureteronephrosis.
2. Additional bilateral nonobstructive nephrolithiasis as above.
3. Colonic diverticulosis without evidence for acute diverticulitis.
4. Mildly enlarged periaortic and aortocaval adenopathy within the
upper abdomen, mildly increased from previous. Finding is
indeterminate, but could be reactive in nature. Attention at
follow-up.
5. Moderate aorto bi-iliac atherosclerotic disease.

## 2018-12-15 IMAGING — RF DG ABDOMEN 1V
1 series · 1 of 1 positions shown · non-contrast
Comparison: CT scan of same day.

FLUOROSCOPY TIME:  28 seconds.

CLINICAL DATA: Fluoroscopic guidance during cystoscopy. Ureteral
stone.

EXAM:
ABDOMEN - 1 VIEW; DG C-ARM 1-60 MIN

[Series 1: run · 1 of 1 slices shown]
[im 1/1]
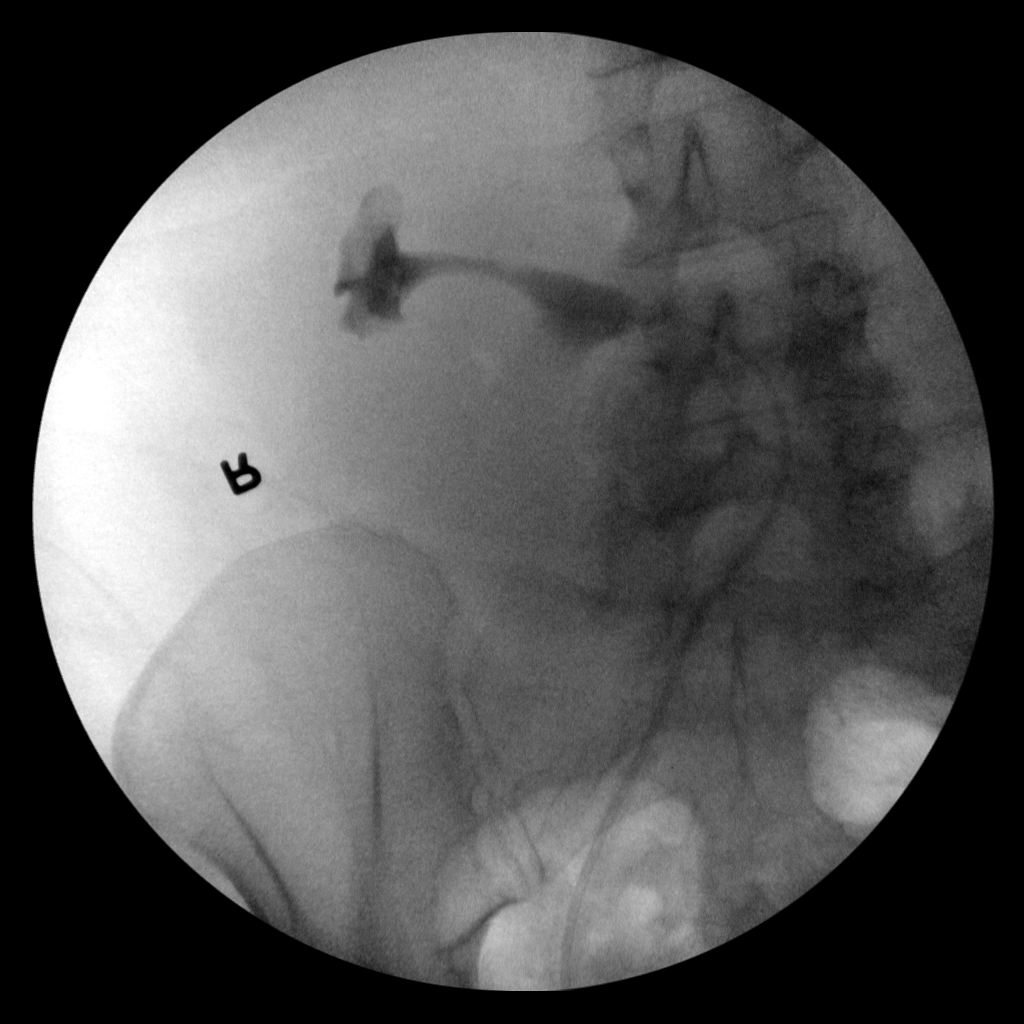

[1 of 1 positions shown; findings below may reference images not displayed]

FINDINGS: Single intraoperative fluoroscopic image demonstrates catheter in
right ureter with injection of contrast into right intrarenal
collecting system. No significant hydronephrosis is noted.
IMPRESSION: Fluoroscopic guidance provided during cystoscopy.

## 2018-12-15 IMAGING — RF DG C-ARM 1-60 MIN
1 series · 1 of 1 positions shown · non-contrast
Comparison: CT scan of same day.

FLUOROSCOPY TIME:  28 seconds.

CLINICAL DATA: Fluoroscopic guidance during cystoscopy. Ureteral
stone.

EXAM:
ABDOMEN - 1 VIEW; DG C-ARM 1-60 MIN

[Series 1: run · 1 of 1 slices shown]
[im 1/1]
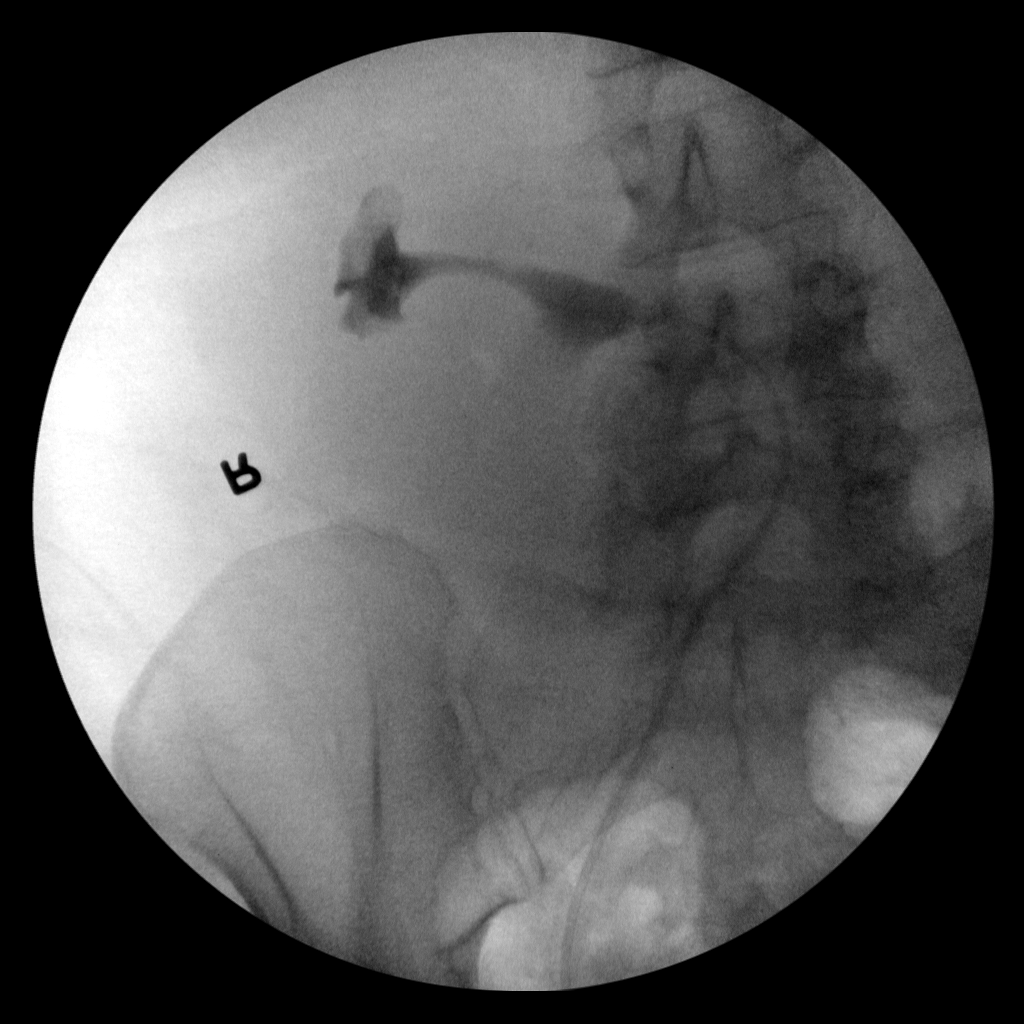

[1 of 1 positions shown; findings below may reference images not displayed]

FINDINGS: Single intraoperative fluoroscopic image demonstrates catheter in
right ureter with injection of contrast into right intrarenal
collecting system. No significant hydronephrosis is noted.
IMPRESSION: Fluoroscopic guidance provided during cystoscopy.

## 2018-12-15 IMAGING — DX DG CHEST 1V
1 series · 1 of 1 positions shown · non-contrast
Comparison: Yesterday

CLINICAL DATA: Central line placement

EXAM:
CHEST  1 VIEW

[chest ap]
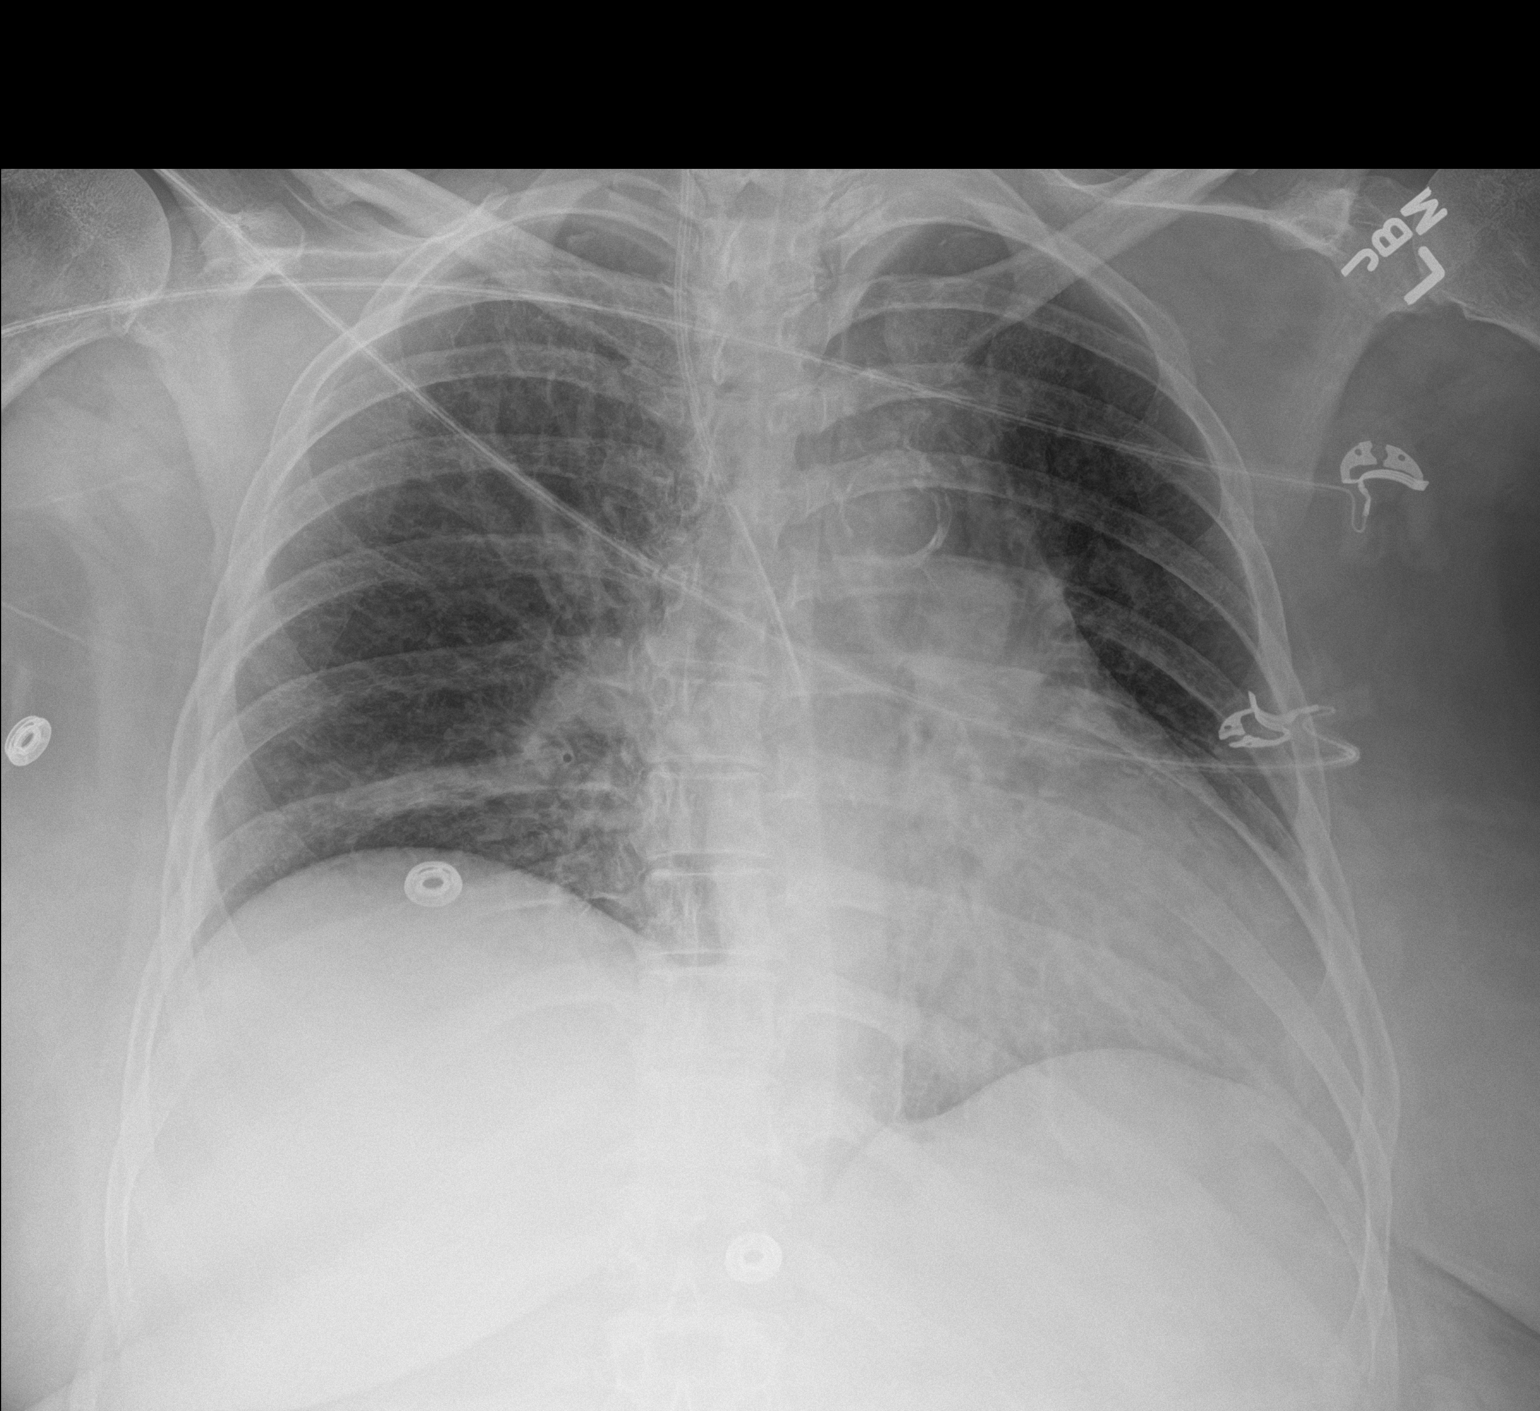

[1 of 1 positions shown; findings below may reference images not displayed]

FINDINGS: Cardiomegaly. Low volume chest with interstitial coarsening. No
Kerley lines, effusion, or pneumothorax. Right IJ line with tip at
the SVC, course distorted by leftward rotation.
IMPRESSION: Right IJ line with tip at the SVC.  No pneumothorax.

Cardiomegaly and vascular congestion.

## 2018-12-15 SURGERY — CYSTOSCOPY, WITH RETROGRADE PYELOGRAM AND URETERAL STENT INSERTION
Anesthesia: General | Site: Ureter | Laterality: Right

## 2018-12-15 MED ORDER — INFLUENZA VAC SPLIT QUAD 0.5 ML IM SUSY
0.5000 mL | PREFILLED_SYRINGE | INTRAMUSCULAR | Status: AC
  Administered 2018-12-17: 09:00:00 0.5 mL via INTRAMUSCULAR
  Filled 2018-12-15: qty 0.5

## 2018-12-15 MED ORDER — MORPHINE SULFATE (PF) 2 MG/ML IV SOLN: 2.0000 mg | INTRAVENOUS | Status: DC | PRN

## 2018-12-15 MED ORDER — INSULIN GLARGINE 100 UNIT/ML ~~LOC~~ SOLN
15.0000 [IU] | Freq: Every day | SUBCUTANEOUS | Status: DC
  Administered 2018-12-15 – 2018-12-24 (×10): 15 [IU] via SUBCUTANEOUS
  Filled 2018-12-15 (×12): qty 0.15

## 2018-12-15 MED ORDER — ACETAMINOPHEN 10 MG/ML IV SOLN: 1000.0000 mg | Freq: Once | INTRAVENOUS | Status: DC | PRN

## 2018-12-15 MED ORDER — SODIUM CHLORIDE 0.9 % IV BOLUS (SEPSIS)
1000.0000 mL | Freq: Once | INTRAVENOUS | Status: AC
  Administered 2018-12-15: 04:00:00 1000 mL via INTRAVENOUS

## 2018-12-15 MED ORDER — DEXAMETHASONE SODIUM PHOSPHATE 10 MG/ML IJ SOLN
INTRAMUSCULAR | Status: DC | PRN
  Administered 2018-12-15: 10 mg via INTRAVENOUS

## 2018-12-15 MED ORDER — PHENYLEPHRINE HCL-NACL 10-0.9 MG/250ML-% IV SOLN
25.0000 ug/min | INTRAVENOUS | Status: DC
  Administered 2018-12-15: 25 ug/min via INTRAVENOUS
  Administered 2018-12-15: 125 ug/min via INTRAVENOUS
  Filled 2018-12-15 (×2): qty 250

## 2018-12-15 MED ORDER — ACETAMINOPHEN 160 MG/5ML PO SOLN: 1000.0000 mg | Freq: Once | ORAL | Status: DC | PRN

## 2018-12-15 MED ORDER — PNEUMOCOCCAL 13-VAL CONJ VACC IM SUSP
0.5000 mL | INTRAMUSCULAR | Status: DC
  Filled 2018-12-15: qty 0.5

## 2018-12-15 MED ORDER — FENTANYL CITRATE (PF) 100 MCG/2ML IJ SOLN: 25.0000 ug | INTRAMUSCULAR | Status: DC | PRN

## 2018-12-15 MED ORDER — LIDOCAINE 2% (20 MG/ML) 5 ML SYRINGE
INTRAMUSCULAR | Status: DC | PRN
  Administered 2018-12-15: 60 mg via INTRAVENOUS

## 2018-12-15 MED ORDER — MIDAZOLAM HCL 2 MG/2ML IJ SOLN
INTRAMUSCULAR | Status: AC
  Filled 2018-12-15: qty 2

## 2018-12-15 MED ORDER — IOHEXOL 300 MG/ML  SOLN
INTRAMUSCULAR | Status: DC | PRN
  Administered 2018-12-15: 50 mL via URETHRAL

## 2018-12-15 MED ORDER — ETOMIDATE 2 MG/ML IV SOLN
INTRAVENOUS | Status: DC | PRN
  Administered 2018-12-15: 12 mg via INTRAVENOUS

## 2018-12-15 MED ORDER — SUFENTANIL CITRATE 50 MCG/ML IV SOLN
INTRAVENOUS | Status: AC
  Filled 2018-12-15: qty 1

## 2018-12-15 MED ORDER — PROPOFOL 10 MG/ML IV BOLUS
INTRAVENOUS | Status: AC
  Filled 2018-12-15: qty 20

## 2018-12-15 MED ORDER — MORPHINE SULFATE (PF) 2 MG/ML IV SOLN
1.0000 mg | INTRAVENOUS | Status: DC | PRN
  Filled 2018-12-15: qty 1

## 2018-12-15 MED ORDER — HEPARIN SODIUM (PORCINE) 5000 UNIT/ML IJ SOLN
5000.0000 [IU] | Freq: Three times a day (TID) | INTRAMUSCULAR | Status: DC
  Administered 2018-12-15 – 2018-12-25 (×29): 5000 [IU] via SUBCUTANEOUS
  Filled 2018-12-15 (×29): qty 1

## 2018-12-15 MED ORDER — ORAL CARE MOUTH RINSE
15.0000 mL | Freq: Two times a day (BID) | OROMUCOSAL | Status: DC
  Administered 2018-12-15 – 2018-12-25 (×16): 15 mL via OROMUCOSAL

## 2018-12-15 MED ORDER — OXYCODONE HCL 5 MG PO TABS: 5.0000 mg | ORAL_TABLET | Freq: Once | ORAL | Status: DC | PRN

## 2018-12-15 MED ORDER — VASOPRESSIN 20 UNIT/ML IV SOLN
INTRAVENOUS | Status: DC | PRN
  Administered 2018-12-15: 1 [IU] via INTRAVENOUS

## 2018-12-15 MED ORDER — ACETAMINOPHEN 325 MG PO TABS
650.0000 mg | ORAL_TABLET | Freq: Four times a day (QID) | ORAL | Status: DC | PRN
  Administered 2018-12-15 – 2018-12-25 (×20): 650 mg via ORAL
  Filled 2018-12-15 (×19): qty 2

## 2018-12-15 MED ORDER — SUCCINYLCHOLINE CHLORIDE 200 MG/10ML IV SOSY
PREFILLED_SYRINGE | INTRAVENOUS | Status: DC | PRN
  Administered 2018-12-15: 110 mg via INTRAVENOUS

## 2018-12-15 MED ORDER — STERILE WATER FOR IRRIGATION IR SOLN
Status: DC | PRN
  Administered 2018-12-15: 100 mL

## 2018-12-15 MED ORDER — SODIUM CHLORIDE 0.9 % IR SOLN
Status: DC | PRN
  Administered 2018-12-15: 3000 mL

## 2018-12-15 MED ORDER — VASOPRESSIN 20 UNIT/ML IV SOLN
INTRAVENOUS | Status: AC
  Filled 2018-12-15: qty 1

## 2018-12-15 MED ORDER — CHLORHEXIDINE GLUCONATE CLOTH 2 % EX PADS
6.0000 | MEDICATED_PAD | Freq: Every day | CUTANEOUS | Status: DC
  Administered 2018-12-15 – 2018-12-22 (×4): 6 via TOPICAL

## 2018-12-15 MED ORDER — INSULIN ASPART 100 UNIT/ML ~~LOC~~ SOLN
0.0000 [IU] | SUBCUTANEOUS | Status: DC
  Administered 2018-12-15 (×2): 5 [IU] via SUBCUTANEOUS
  Administered 2018-12-15: 12:00:00 15 [IU] via SUBCUTANEOUS

## 2018-12-15 MED ORDER — LACTATED RINGERS IV SOLN
INTRAVENOUS | Status: DC
  Administered 2018-12-15: 15:00:00 via INTRAVENOUS
  Administered 2018-12-15: 22:00:00 125 mL/h via INTRAVENOUS
  Administered 2018-12-15 – 2018-12-16 (×2): via INTRAVENOUS
  Administered 2018-12-16: 125 mL/h via INTRAVENOUS
  Administered 2018-12-17 – 2018-12-18 (×4): via INTRAVENOUS

## 2018-12-15 MED ORDER — SODIUM CHLORIDE 0.9 % IV SOLN
INTRAVENOUS | Status: DC | PRN
  Administered 2018-12-15 (×2): via INTRAVENOUS

## 2018-12-15 MED ORDER — MIDAZOLAM HCL 2 MG/2ML IJ SOLN
INTRAMUSCULAR | Status: DC | PRN
  Administered 2018-12-15: 2 mg via INTRAVENOUS

## 2018-12-15 MED ORDER — INSULIN ASPART 100 UNIT/ML ~~LOC~~ SOLN
0.0000 [IU] | SUBCUTANEOUS | Status: DC
  Administered 2018-12-15: 7 [IU] via SUBCUTANEOUS
  Administered 2018-12-15: 11 [IU] via SUBCUTANEOUS
  Administered 2018-12-16: 7 [IU] via SUBCUTANEOUS
  Administered 2018-12-16: 21:00:00 11 [IU] via SUBCUTANEOUS
  Administered 2018-12-16: 4 [IU] via SUBCUTANEOUS
  Administered 2018-12-16: 09:00:00 11 [IU] via SUBCUTANEOUS
  Administered 2018-12-16 (×2): 4 [IU] via SUBCUTANEOUS
  Administered 2018-12-17: 3 [IU] via SUBCUTANEOUS
  Administered 2018-12-17 (×2): 4 [IU] via SUBCUTANEOUS

## 2018-12-15 MED ORDER — ACETAMINOPHEN 500 MG PO TABS: 1000.0000 mg | ORAL_TABLET | Freq: Once | ORAL | Status: DC | PRN

## 2018-12-15 MED ORDER — SODIUM CHLORIDE 0.9 % IV SOLN
2.0000 g | INTRAVENOUS | Status: DC
  Administered 2018-12-15 – 2018-12-19 (×5): 2 g via INTRAVENOUS
  Filled 2018-12-15: qty 20
  Filled 2018-12-15: qty 2
  Filled 2018-12-15 (×2): qty 20
  Filled 2018-12-15: qty 2
  Filled 2018-12-15: qty 20
  Filled 2018-12-15: qty 2

## 2018-12-15 MED ORDER — OXYCODONE HCL 5 MG/5ML PO SOLN: 5.0000 mg | Freq: Once | ORAL | Status: DC | PRN

## 2018-12-15 MED ORDER — ATORVASTATIN CALCIUM 40 MG PO TABS
40.0000 mg | ORAL_TABLET | Freq: Every day | ORAL | Status: DC
  Administered 2018-12-15 – 2018-12-20 (×6): 40 mg via ORAL
  Filled 2018-12-15 (×6): qty 1

## 2018-12-15 MED ORDER — SUFENTANIL CITRATE 50 MCG/ML IV SOLN
INTRAVENOUS | Status: DC | PRN
  Administered 2018-12-15: 10 ug via INTRAVENOUS

## 2018-12-15 MED ORDER — 0.9 % SODIUM CHLORIDE (POUR BTL) OPTIME
TOPICAL | Status: DC | PRN
  Administered 2018-12-15: 1000 mL

## 2018-12-15 MED ORDER — SODIUM CHLORIDE 0.9 % IV SOLN
250.0000 mL | INTRAVENOUS | Status: DC
  Administered 2018-12-15 (×2): 250 mL via INTRAVENOUS

## 2018-12-15 MED ORDER — ONDANSETRON HCL 4 MG/2ML IJ SOLN
4.0000 mg | Freq: Four times a day (QID) | INTRAMUSCULAR | Status: DC | PRN
  Administered 2018-12-15 – 2018-12-21 (×4): 4 mg via INTRAVENOUS
  Filled 2018-12-15 (×6): qty 2

## 2018-12-15 MED ORDER — ONDANSETRON HCL 4 MG/2ML IJ SOLN
INTRAMUSCULAR | Status: DC | PRN
  Administered 2018-12-15: 4 mg via INTRAVENOUS

## 2018-12-15 MED ORDER — NOREPINEPHRINE 4 MG/250ML-% IV SOLN
0.0000 ug/min | INTRAVENOUS | Status: DC
  Administered 2018-12-15: 10:00:00 20 ug/min via INTRAVENOUS
  Administered 2018-12-15: 08:00:00 10 ug/min via INTRAVENOUS
  Administered 2018-12-15: 16:00:00 5 ug/min via INTRAVENOUS
  Filled 2018-12-15 (×3): qty 250

## 2018-12-15 SURGICAL SUPPLY — 41 items
BAG URO CATCHER STRL LF (MISCELLANEOUS) ×3 IMPLANT
BASKET LASER NITINOL 1.9FR (BASKET) IMPLANT
BASKET STNLS GEMINI 4WIRE 3FR (BASKET) IMPLANT
BASKET ZERO TIP NITINOL 2.4FR (BASKET) IMPLANT
CATH INTERMIT  6FR 70CM (CATHETERS) ×3 IMPLANT
CATH URET 5FR 28IN CONE TIP (BALLOONS)
CATH URET 5FR 28IN OPEN ENDED (CATHETERS) IMPLANT
CATH URET 5FR 70CM CONE TIP (BALLOONS) IMPLANT
CATH URET DUAL LUMEN 6-10FR 50 (CATHETERS) IMPLANT
COVER WAND RF STERILE (DRAPES) IMPLANT
ELECT REM PT RETURN 9FT ADLT (ELECTROSURGICAL)
ELECTRODE REM PT RTRN 9FT ADLT (ELECTROSURGICAL) IMPLANT
GLOVE BIO SURGEON STRL SZ7.5 (GLOVE) IMPLANT
GLOVE BIOGEL PI IND STRL 8 (GLOVE) ×1 IMPLANT
GLOVE BIOGEL PI INDICATOR 8 (GLOVE) ×2
GLOVE SURG SS PI 7.5 STRL IVOR (GLOVE) ×9 IMPLANT
GLOVE SURG SS PI 8.0 STRL IVOR (GLOVE) ×3 IMPLANT
GOWN STRL REUS W/ TWL LRG LVL3 (GOWN DISPOSABLE) ×1 IMPLANT
GOWN STRL REUS W/ TWL XL LVL3 (GOWN DISPOSABLE) ×1 IMPLANT
GOWN STRL REUS W/TWL LRG LVL3 (GOWN DISPOSABLE) ×2
GOWN STRL REUS W/TWL XL LVL3 (GOWN DISPOSABLE) ×2
GUIDEWIRE ANG ZIPWIRE 038X150 (WIRE) IMPLANT
GUIDEWIRE STR DUAL SENSOR (WIRE) ×3 IMPLANT
IV NS IRRIG 3000ML ARTHROMATIC (IV SOLUTION) ×3 IMPLANT
KIT BALLN UROMAX 15FX4 (MISCELLANEOUS) IMPLANT
KIT BALLN UROMAX 26 75X4 (MISCELLANEOUS)
KIT TURNOVER KIT B (KITS) ×3 IMPLANT
MANIFOLD NEPTUNE II (INSTRUMENTS) ×3 IMPLANT
NS IRRIG 1000ML POUR BTL (IV SOLUTION) ×3 IMPLANT
PACK CYSTO (CUSTOM PROCEDURE TRAY) ×3 IMPLANT
SET IRRIG Y TYPE TUR BLADDER L (SET/KITS/TRAYS/PACK) ×3 IMPLANT
SHEATH URETERAL 12FRX35CM (MISCELLANEOUS) IMPLANT
SOL PREP POV-IOD 4OZ 10% (MISCELLANEOUS) ×3 IMPLANT
STENT URET 6FRX24 CONTOUR (STENTS) ×3 IMPLANT
SYR CONTROL 10ML LL (SYRINGE) ×3 IMPLANT
SYRINGE IRR TOOMEY STRL 70CC (SYRINGE) ×3 IMPLANT
TRAY FOL W/BAG SLVR 16FR STRL (SET/KITS/TRAYS/PACK) ×1 IMPLANT
TRAY FOLEY W/BAG SLVR 16FR LF (SET/KITS/TRAYS/PACK) ×2
TUBE CONNECTING 12'X1/4 (SUCTIONS) ×1
TUBE CONNECTING 12X1/4 (SUCTIONS) ×2 IMPLANT
WATER STERILE IRR 3000ML UROMA (IV SOLUTION) ×3 IMPLANT

## 2018-12-15 NOTE — Progress Notes (Addendum)
PCCM  Pt seen post Operative POD 0 Cystoscopy, right retrograde ureteropyelogram, fluoroscopic interpretation, right double-J stent placement (24 cm x 6 [place])  Pt noted to have normal distal ureter with a filling defect approximately 3 to 4 cm proximally.  The filling defect did move as the retrograde was performed.  There was proximal hydroureteronephrosis.   She was Extubated post procedure in PACU with no events Pt has an RIJ double lumen and left radial arterial line  On my examination  she is alert and verbal c/o pain around right flank and that she feels hot Neo gtt at increasing doses  ABG  7.334/46/136/23  Plan:  Continue on LR at 125 Start Levophed gtt to be given via cental access ( RIJ double lumen) MAP goal >21mmHg Once BP improves prn analgesia Continue on Ceftriaxone IV Started on PO Tylenol prn fevers  Signed Dr Seward Carol Pulmonary Critical Care Locums

## 2018-12-15 NOTE — Anesthesia Procedure Notes (Signed)
Central Venous Catheter Insertion Performed by: Oleta Mouse, MD, anesthesiologist Start/End9/24/2020 4:51 AM, 12/15/2018 4:58 AM Patient location: OR. Preanesthetic checklist: patient identified, IV checked, site marked, risks and benefits discussed, surgical consent, monitors and equipment checked, pre-op evaluation, timeout performed and anesthesia consent Position: supine Patient sedated Hand hygiene performed , maximum sterile barriers used  and Seldinger technique used Catheter size: 8 Fr Total catheter length 16. Central line was placed.Double lumen Swan type:thermodilution Procedure performed using ultrasound guided technique. Ultrasound Notes:anatomy identified, needle tip was noted to be adjacent to the nerve/plexus identified, no ultrasound evidence of intravascular and/or intraneural injection and image(s) printed for medical record Attempts: 1 Following insertion, line sutured, dressing applied and Biopatch. Post procedure assessment: blood return through all ports, free fluid flow and no air  Patient tolerated the procedure well with no immediate complications.

## 2018-12-15 NOTE — Op Note (Signed)
Preoperative diagnosis: Right distal ureteral calculus with pyelonephrosis/sepsis  Postoperative diagnosis: Same  Principal procedure: Cystoscopy, right retrograde ureteropyelogram, fluoroscopic interpretation, right double-J stent placement (24 cm x 6 [place])  Surgeon: Diona Fanti  Anesthesia: General endotracheal  Complications: None  Specimens: None  Drains: 16 French silicone Foley catheter to bedside bag  Indications: 53 year old female with right distal ureteral stone and subsequent pyelonephrosis.  She has a prior history of calculus disease.  She presented to the emergency room this morning with 1 month history of pain and worsening medical condition.  She has multiple medical comorbidities.  The patient has been vigorously resuscitated in the emergency room.  Urology has been consulted and I have spoken with the patient about urgent stent placement, critical care management as well as eventual stone management after antibiotic treatment and recovery.  She understands this and desires to proceed.  Description of procedure: The patient was properly identified and marked in the holding area.  She had received Rocephin preoperatively.  She was taken to the operating room where general anesthetic was administered.  Anesthesia placed a left radial arterial line.  She was then placed in the dorsolithotomy position.  Genitalia and perineum were prepped and draped.  Timeout was performed.  21 French panendoscope advanced into her bladder.  Mild generalized erythema consistent with cystitis.  Ureteral orifice ease identified and normally placed bilaterally.  Right ureteral orifice was cannulated with a 6 [place] open-ended catheter.  Gentle retrograde ureteropyelogram was performed using Omnipaque.  This revealed a normal distal ureter with a filling defect approximately 3 to 4 cm proximally.  The filling defect did move as the retrograde was performed.  There was proximal hydroureteronephrosis.  No  other filling defects were seen.  Following retrograde, has a six 0.038 inch sensor tip guidewire was advanced through the open-ended catheter with a curl seen in an upper pole calyx.  Open-ended catheter was removed and then a 6 [place] by 24 centimeters contour double-J stent with a tether removed was adequately positioned in the ureter, with proximal and distal curl seen using fluoroscopy and cystoscopy, respectively once a guidewire was removed.  Following this the scope was removed.  16 French silicone Foley catheter was then placed, balloon inflated with 10 cc of water and this was hooked to a bedside bag.  Procedure was terminated.  Anesthesia placed adequate monitoring lines following this.  She was then taken to the PACU.  She tolerated the procedure well.

## 2018-12-15 NOTE — Anesthesia Procedure Notes (Signed)
Arterial Line Insertion Start/End9/24/2020 4:47 AM, 12/15/2018 4:51 AM Performed by: Oleta Mouse, MD, anesthesiologist  Patient location: OR. Preanesthetic checklist: patient identified, IV checked, site marked, risks and benefits discussed, surgical consent, monitors and equipment checked, pre-op evaluation, timeout performed and anesthesia consent Lidocaine 1% used for infiltration Left, radial was placed Catheter size: 20 G Hand hygiene performed  and maximum sterile barriers used   Attempts: 1 Procedure performed without using ultrasound guided technique. Following insertion, dressing applied and Biopatch. Post procedure assessment: normal and unchanged  Patient tolerated the procedure well with no immediate complications.

## 2018-12-15 NOTE — Transfer of Care (Signed)
Immediate Anesthesia Transfer of Care Note  Patient: Pamela Carney  Procedure(s) Performed: CYSTOSCOPY WITH RETROGRADE PYELOGRAM/RIGHT URETERAL STENT PLACEMENT (Right )  Patient Location: PACU  Anesthesia Type:General  Level of Consciousness: awake, alert , oriented and patient cooperative  Airway & Oxygen Therapy: Patient Spontanous Breathing and Patient connected to face mask oxygen  Post-op Assessment: Report given to RN, Post -op Vital signs reviewed and stable and Patient moving all extremities X 4  Post vital signs: Reviewed and stable  Last Vitals:  Vitals Value Taken Time  BP 96/83 12/15/18 0526  Temp 37.3 C 12/15/18 0525  Pulse 108 12/15/18 0534  Resp 23 12/15/18 0534  SpO2 92 % 12/15/18 0534  Vitals shown include unvalidated device data.  Last Pain:  Vitals:   12/15/18 0525  TempSrc:   PainSc: 0-No pain         Complications: No apparent anesthesia complications

## 2018-12-15 NOTE — ED Notes (Signed)
Patient transported to CT scan . 

## 2018-12-15 NOTE — H&P (Signed)
..   NAME:  Pamela Carney, MRN:  [id number], DOB:  [birth year], LOS: 0 ADMISSION DATE:  12/14/2018, CONSULTATION DATE:  12/15/2018 REFERRING MD:  Christy Gentles MD, CHIEF COMPLAINT:  Abdominal pain and vomiting   Brief History   53 yr old F w/ PMHx sig for presents to [facility] w/ abdominal pain and emesis found to have a 11 mm obtsructng stone. S/p 3.5L IVF La 3.5 and SBP in 80 s PCCM asked to admit.  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 Per EDP documentation the patient stated that these symptoms began three days ago and have become progressively worse. She also reports subjective fevers, and a non productive cough.   Seen briefly prior to patient being transferred to OR On IVF toxic appearing c/o abdominal pain. Her symptoms have been going for a month and became progressively worse in the last few days.  Past Medical History  .Marland Kitchen Active Ambulatory Problems    Diagnosis Date Noted  . Pain 12/27/2012  . Family planning, IUD (intrauterine device) check/reinsertion/removal 12/28/2012  . Perimenopausal 12/29/2012  . Neuropathy involving both lower extremities 01/31/2014  . Chronic interstitial nephritis 12/24/2017  . Generalized weakness 12/24/2017  . Sepsis ([place]) 12/24/2017  . Hypokalemia 12/24/2017  . Oral candidiasis 12/24/2017  . Acute cystitis with hematuria   . CKD (chronic kidney disease), stage III ([place]) 12/28/2017  . 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 New onset type 2 diabetes mellitus ([place]) 02/01/2018  . Bipolar 1 disorder ([place]) 02/01/2018  . IUD (intrauterine device) in place 02/01/2018  . Obesity 02/01/2018  . Healthcare maintenance 02/01/2018   Resolved Ambulatory Problems    Diagnosis Date Noted  . Acute lower UTI 12/24/2017  .  Metabolic acidosis 34/91/7915  . Acute kidney failure ([place]) 12/24/2017   Past Medical History:  Diagnosis Date  . Abnormal Pap smear   . AKI (acute kidney injury) ([place]) 12/28/2017  . Anxiety   . Arthritis   . Cataract   . Cholelithiasis 12/23/2017  . Chronic kidney disease (CKD), stage III (moderate) ([facility])   . Cystitis   . Diabetes mellitus without complication ([place])   . Genital warts   . GERD (gastroesophageal reflux disease)   . Heart murmur   . Hematuria   . History of blood transfusion   . History of ectopic pregnancy   . History of gestational diabetes   . History of kidney stones   . History of sepsis   . Hypertension   . Idiopathic peripheral neuropathy   . Leg ulcer, left ([place])   . Low back pain   . Migraines   . Pneumonia   . PONV (postoperative nausea and vomiting)   . Recurrent UTI   . Renal calculi 12/23/2017  . Sigmoid diverticulosis 12/23/2017  . Ulcer of foot ([place])   . Weakness    PREVIOUS CULTURES: October 2019 Component 70moago  Specimen Description KIDNEY LEFT NEPHROSTOMY   Special Requests NONE  [facility] [address], [place] [address]  Culture >=100,000 COLONIES/mL ESCHERICHIA COLIAbnormal    Report Status 12/28/2017 FINAL   Organism ID, Bacteria ESCHERICHIA COLIAbnormal    Resulting Agency [facility]  Susceptibility   Escherichia coli    MIC  AMPICILLIN >=32 RESIST... Resistant    AMPICILLIN/SULBACTAM 16 INTERMED... Intermediate    CEFAZOLIN <=4 SENSITIVE  Sensitive    CEFEPIME <=1 SENSITIVE  Sensitive    CEFTAZIDIME <=1 SENSITIVE  Sensitive    CEFTRIAXONE <=1 SENSITIVE  Sensitive    CIPROFLOXACIN >=4 RESISTANT  Resistant    Extended ESBL NEGATIVE  Sensitive    GENTAMICIN <=1 SENSITIVE  Sensitive    IMIPENEM <=0.25 SENS... Sensitive    PIP/TAZO <=4 SENSITIVE  Sensitive    TRIMETH/SULFA <=20 SENSIT... Sensitive        Consults:  9/24/>>>>UROLOGY 9/24>>>>> PCCM  Procedures:  For  OR>>urgent cystoscopy and double-J stent placement  Significant Diagnostic Tests:  12/15/2018 CT RENAL STONE STUDY IMPRESSION: 1. 10 mm obstructive stone within the distal right ureter with secondary moderate left hydroureteronephrosis. 2. Additional bilateral nonobstructive nephrolithiasis as above. 3. Colonic diverticulosis without evidence for acute diverticulitis. 4. Mildly enlarged periaortic and aortocaval adenopathy within the upper abdomen, mildly increased from previous. Finding is indeterminate, but could be reactive in nature. Attention at follow-up. 5. Moderate aorto bi-iliac atherosclerotic disease.  LA 2.2>>3.4 Na 129 K= 5.3 Cl- 92 CO2 22 Glucose 245 BUN 42 Cr 2.45 AG 15 AST 32 ALT 22 Tbili 1.1 GFR 22 Albumin 2.6  WBC 12.5 Hgb 14.4 Plt 221  UA: cloudy w/ large leuk negative nitrite proteinuria 100 many bacteria   and pyuria WBCs 21-50 w/ clumps  EKG Vent. rate 102 BPM PR interval * ms QRS duration 102 ms QT/QTc 334/435 ms P-R-T axes 81 121 -29 Micro Data:  12/15/2018>>>Blood cultures x 2 12/15/2018>>>Urine culture  Antimicrobials:  12/15/2018>>>Rocephin   Interim history/subjective:  Initially improved BP post 3.5L IVF bolus ( 30cc/kg) however after some time pt SBP in 80s and repeat LA elevated in comparison to previous. Seen by Urology>>for OR Objective   Blood pressure (!) 86/42, pulse 89, temperature 100 F (37.8 C), temperature source Oral, resp. rate 18, height 5' 2"  (1.575 m), weight 86.2 kg, SpO2 98 %.        Intake/Output Summary (Last 24 hours) at 12/15/2018 0323 Last data filed at 12/15/2018 0149 Gross per 24 hour  Intake 3500 ml  Output 0 ml  Net 3500 ml   Filed Weights   12/15/18 0011  Weight: 86.2 kg    Examination: General: ill appearing female laying in bed HENT: normocephalic atraumatic , mask  Lungs: decreased breath sounds at bases no wheezing or appreciable crackles or rhonci Cardiovascular: S1 and S2 increased rate Abdomen:  obese abdomen soft decreased BS RLQ tenderness and tender at right CVA.  Extremities: no edema  Neuro: alert and oriented no focal deficits Skin: dry and intact no rashes   Assessment & Plan:  1. Septic Shock secondary to urinary infection Obstructing calculus in ureter  11 mm right distal ureteral stone with subsequent pyelonephrosis Seen by Urology  Started on empiric abx Ceftriaxone Received 30 cc/kg IVF LA 2.2 >>3.4 Plan: Source control with Urology for urgent cystoscopy and double-J stent placement Continue on Empiric abx with Ceftriaxone IV ( reviewed previous culture and sensitivities from Oct 2019) Continue IVF LR at 125 cc MAP goal >71mHg Neo gtt ordered F/U 2D ECHO to assess LVEF  Trend WBC, fever curve and LA  2. Acute Kidney Injury on CKD stage III Secondary to hypoperfusion in the setting of sepsis  And obstructing ureteral stone w/ subsequent pyelonehprosis Plan: Continue IVF Monitor Is and Os F/u ABG if ph <7.2 will start on bicarb Avoid nephrotoxic meds when  possible   3. IDDM On Tresiba at home with Metformin Plan Hold home meds Continue with ISS Goal BG 140-123m/dl F/u HgbA1c  4. H/O Bipolar Type 1 On review of home meds none for this condition Previously on Depakote Will ask Pharmacy to review  5. Moderate aorto bi-iliac atherosclerotic disease Seen on CT study Plan: Check Lipid panel    Best practice:  Diet: NPO for now Pain/Anxiety/Delirium protocol (if indicated): prn analgesia  VAP protocol (if indicated): not intubated at time of eval DVT prophylaxis: Hep [place] and SCDs GI prophylaxis: not indicated Glucose control: ISS goal BG 140-182mdl Mobility: bedrest Code Status: FULL Family Communication: none at bedside and none listed on Facesheet John McNeil at [phone] AAOx3 so did not call this individual and discuss any details  Disposition: Emergent OR case>>> ICU post  Labs   CBC: Recent Labs  Lab 12/14/18 2058  WBC  12.5*  HGB 14.4  HCT 42.2  MCV 97.0  PLT 22563  Basic Metabolic Panel: Recent Labs  Lab 12/14/18 2058  NA 129*  K 5.3*  CL 92*  CO2 22  GLUCOSE 245*  BUN 42*  CREATININE 2.45*  CALCIUM 9.5   GFR: Estimated Creatinine Clearance: 27.4 mL/min (A) (by C-G formula based on SCr of 2.45 mg/dL (H)). Recent Labs  Lab 12/14/18 2058 12/14/18 2339 12/15/18 0226  WBC 12.5*  --   --   LATICACIDVEN  --  2.2* 3.4*    Liver Function Tests: Recent Labs  Lab 12/14/18 2058  AST 32  ALT 22  ALKPHOS 109  BILITOT 1.1  PROT 7.1  ALBUMIN 2.6*   Recent Labs  Lab 12/14/18 2058  LIPASE 25   No results for input(s): AMMONIA in the last 168 hours.  ABG    Component Value Date/Time   PHART 7.201 (L) 12/24/2017 0901   PCO2ART 19.6 (LL) 12/24/2017 0901   PO2ART 104 12/24/2017 0901   HCO3 7.4 (L) 12/24/2017 0901   TCO2 25 03/31/2010 0259   ACIDBASEDEF 19.3 (H) 12/24/2017 0901   O2SAT 97.3 12/24/2017 0901     Coagulation Profile: No results for input(s): INR, PROTIME in the last 168 hours.  Cardiac Enzymes: No results for input(s): CKTOTAL, CKMB, CKMBINDEX, TROPONINI in the last 168 hours.  HbA1C: Hgb A1c MFr Bld  Date/Time Value Ref Range Status  12/28/2017 05:33 AM 11.3 (H) 4.8 - 5.6 % Final    Comment:    (NOTE) Pre diabetes:          5.7%-6.4% Diabetes:              >6.4% Glycemic control for   <7.0% adults with diabetes     CBG: No results for input(s): GLUCAP in the last 168 hours.  Review of Systems:   ..Marland KitchenMarland Kitchenview of Systems  Constitutional: Positive for fever and malaise/fatigue.  HENT: Negative.   Eyes: Negative.   Respiratory: Positive for cough. Negative for wheezing.   Gastrointestinal: Positive for abdominal pain, nausea and vomiting.  Genitourinary: Positive for dysuria, frequency and urgency.  Musculoskeletal: Positive for back pain.  Skin: Negative.   Neurological: Negative.   Endo/Heme/Allergies: Negative.      Past Medical History  She,   has a past medical history of Abnormal Pap smear, AKI (acute kidney injury) ([place](12/28/2017), Anxiety, Arthritis, Bipolar 1 disorder (HCV[facility] Cataract, Cholelithiasis (12/23/2017), Chronic kidney disease (CKD), stage III (moderate) ([facility] Cystitis, Diabetes mellitus without complication ([place]CK[place] Genital warts, GERD (gastroesophageal reflux disease), Heart murmur, Hematuria, History of  blood transfusion, History of ectopic pregnancy, History of gestational diabetes, History of kidney stones, History of sepsis, Hypertension, Idiopathic peripheral neuropathy, Leg ulcer, left ([place]), Low back pain, Migraines, Obesity, Oral candidiasis, Pneumonia, PONV (postoperative nausea and vomiting), Recurrent UTI, Renal calculi (12/23/2017), Sigmoid diverticulosis (12/23/2017), Ulcer of foot ([place]), and Weakness.   Surgical History    Past Surgical History:  Procedure Laterality Date  . CERVICAL CONE BIOPSY    . CESAREAN SECTION     x3  . CYSTOSCOPY/URETEROSCOPY/HOLMIUM LASER/STENT PLACEMENT Right 02/04/2018   Procedure: CYSTOSCOPY/URETEROSCOPY/RETROGRADE PYELOGRAM/HOLMIUM LASER/STENT PLACEMENT;  Surgeon: Alexis Frock, MD;  Location: [facility];  Service: Urology;  Laterality: Right;  . CYSTOSCOPY/URETEROSCOPY/HOLMIUM LASER/STENT PLACEMENT Right 02/07/2018   Procedure: CYSTOSCOPY LEFT STENT EXCHANGE;  Surgeon: Alexis Frock, MD;  Location: [facility];  Service: Urology;  Laterality: Right;  . DILATION AND CURETTAGE OF UTERUS    . ECTOPIC PREGNANCY SURGERY    . IR NEPHROSTOMY PLACEMENT LEFT  12/25/2017  . NEPHROLITHOTOMY Left 02/04/2018   Procedure: NEPHROLITHOTOMY PERCUTANEOUS;  Surgeon: Alexis Frock, MD;  Location: [facility];  Service: Urology;  Laterality: Left;  3 HRS  . NEPHROLITHOTOMY Left 02/07/2018   Procedure: SECOND LOOK NEPHROLITHOTOMY PERCUTANEOUS;  Surgeon: Alexis Frock, MD;  Location: [facility];  Service: Urology;  Laterality: Left;  2 HRS  . OVARIAN CYST REMOVAL    . UNILATERAL SALPINGECTOMY     Pt  unsure of which fallopian tube was removed  . WISDOM TOOTH EXTRACTION       Social History   reports that she has been smoking cigarettes. She has a 37.00 pack-year smoking history. She has never used smokeless tobacco. She reports that she does not drink alcohol or use drugs.   Family History   Her family history includes Alcohol abuse in her father; COPD in her mother and paternal grandfather; Cancer in her father, paternal grandfather, and paternal grandmother; Depression in her father and mother; Diabetes in her mother and paternal grandfather; Heart attack in her father, mother, and sister; Heart disease in her mother, paternal grandfather, and paternal grandmother; Heart failure in her mother; Hyperlipidemia in her mother; Hypertension in her father, mother, and sister; Peripheral vascular disease in her mother; Stroke in her maternal grandmother.   Allergies Allergies  Allergen Reactions  . Sulfa Antibiotics Hives and Swelling    Swelling site not recalled  . Latex Rash  . Tape Rash    Not tolerated well     Home Medications  Prior to Admission medications   Medication Sig Start Date End Date Taking? Authorizing Provider  Blood Glucose Monitoring Suppl (ONETOUCH VERIO) w/Device KIT Use to check blood sugars every morning fasting and 2 hours after largest meal 02/02/18   Danford, Katy D, NP  glucose blood (ONETOUCH VERIO) test strip Use to check blood sugars every morning fasting and 2 hours after largest meal 02/02/18   Danford, Katy D, NP  insulin degludec (TRESIBA) 100 UNIT/ML SOPN FlexTouch Pen Inject 0.2 mLs (20 Units total) into the skin daily. 02/02/18   Danford, Valetta Fuller D, NP  Insulin Pen Needle (PEN NEEDLES) 31G X 8 MM MISC 1 each by Does not apply route daily. 02/23/18   Danford, Valetta Fuller D, NP  levonorgestrel (MIRENA) 20 MCG/24HR IUD 1 each by Intrauterine route once.    [provider]  metFORMIN (GLUCOPHAGE) 500 MG tablet Take 1 tablet (500 mg total) by mouth 2 (two)  times daily with a meal. PATIENT MUST HAVE OFFICE VISIT PRIOR TO ANY FURTHER REFILLS! 09/05/18  Danford, Berna Spare, NP  Multiple Vitamins-Minerals (ONE-A-DAY WOMENS 50+ ADVANTAGE) TABS Take 1 tablet by mouth daily.    [provider]  ONE TOUCH LANCETS MISC Use to check blood sugars every morning fasting and 2 hours after largest meal 02/02/18   Danford, Berna Spare, NP    STAFF NOTE  I, Dr Seward Carol have personally reviewed patient's available data, including medical history, events of note, physical examination and test results as part of my evaluation. I have discussed with NP Moshe Cipro and other care providers such as pharmacist, RN and Elink.  In addition,  I personally evaluated patient The patient is critically ill with multiple organ systems failure and requires high complexity decision making for assessment and support, frequent evaluation and titration of therapies, application of advanced monitoring technologies and extensive interpretation of multiple databases.   Critical Care Time devoted to patient care services described in this note is  67 Minutes. This time reflects time of care of this signee Dr Seward Carol. This critical care time does not reflect procedure time, or teaching time or supervisory time but could involve care discussion time     Dr. Seward Carol Pulmonary Critical Care Medicine  12/15/2018 3:55 AM     Critical care time: 47 mins

## 2018-12-15 NOTE — Consult Note (Signed)
Urology Consult   Physician requesting consult: Dr. Ripley Fraise  Reason for consult: Infected stone/sepsis  History of Present Illness: Pamela Carney is a 53 y.o. female with multiple comorbidities, prior history of infected urolithiasis, who is admitted to the emergency room with an infected/obstructing right distal ureteral stone.  She is septic and has had the need for vigorous resuscitation and still with marginal blood pressures.  Urologic consultation is requested.  The patient has had symptoms of a stone for about a month.  She has been feeling worse recently, been anorexic, and finally presented this evening due to worsening symptoms, fever and chills.    Past Medical History:  Diagnosis Date  . Abnormal Pap smear   . AKI (acute kidney injury) ([place]) 12/28/2017  . Anxiety   . Arthritis    bil knees, neck  . Bipolar 1 disorder ([place])   . Cataract    Mild  . Cholelithiasis 12/23/2017   noted on CT renal, pt unaware  . Chronic kidney disease (CKD), stage III (moderate) ([facility])   . Cystitis   . Diabetes mellitus without complication ([place])   . Genital warts    Hx of genital  . GERD (gastroesophageal reflux disease)   . Heart murmur    childhood  . Hematuria   . History of blood transfusion   . History of ectopic pregnancy   . History of gestational diabetes   . History of kidney stones   . History of sepsis    after ectopic pregnancy  . Hypertension   . Idiopathic peripheral neuropathy    both feet  . Leg ulcer, left ([place])   . Low back pain   . Migraines   . Obesity   . Oral candidiasis   . Pneumonia   . PONV (postoperative nausea and vomiting)    prolonged sedation  . Recurrent UTI   . 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 Sigmoid diverticulosis 12/23/2017   noted on CT renal, pt unaware  . Ulcer of foot ([place])    Left  . Weakness     Past Surgical History:   Procedure Laterality Date  . CERVICAL CONE BIOPSY    . CESAREAN SECTION     x3  . CYSTOSCOPY/URETEROSCOPY/HOLMIUM LASER/STENT PLACEMENT Right 02/04/2018   Procedure: CYSTOSCOPY/URETEROSCOPY/RETROGRADE PYELOGRAM/HOLMIUM LASER/STENT PLACEMENT;  Surgeon: Alexis Frock, MD;  Location: [facility];  Service: Urology;  Laterality: Right;  . CYSTOSCOPY/URETEROSCOPY/HOLMIUM LASER/STENT PLACEMENT Right 02/07/2018   Procedure: CYSTOSCOPY LEFT STENT EXCHANGE;  Surgeon: Alexis Frock, MD;  Location: [facility];  Service: Urology;  Laterality: Right;  . DILATION AND CURETTAGE OF UTERUS    . ECTOPIC PREGNANCY SURGERY    . IR NEPHROSTOMY PLACEMENT LEFT  12/25/2017  . NEPHROLITHOTOMY Left 02/04/2018   Procedure: NEPHROLITHOTOMY PERCUTANEOUS;  Surgeon: Alexis Frock, MD;  Location: [facility];  Service: Urology;  Laterality: Left;  3 HRS  . NEPHROLITHOTOMY Left 02/07/2018   Procedure: SECOND LOOK NEPHROLITHOTOMY PERCUTANEOUS;  Surgeon: Alexis Frock, MD;  Location: [facility];  Service: Urology;  Laterality: Left;  2 HRS  . OVARIAN CYST REMOVAL    . UNILATERAL SALPINGECTOMY     Pt unsure of which fallopian tube was removed  . [place] EXTRACTION       Current Hospital Medications: Scheduled Meds: . sodium chloride flush  3 mL Intravenous Once   Continuous Infusions: . cefTRIAXone (ROCEPHIN)  IV Stopped (12/15/18 0115)  .  sodium chloride     PRN Meds:.  Allergies:  Allergies  Allergen Reactions  . Sulfa Antibiotics Hives and Swelling    Swelling site not recalled  . Latex Rash  . Tape Rash    Not tolerated well    Family History  Problem Relation Age of Onset  . Diabetes Paternal Grandfather   . COPD Paternal Grandfather   . Heart disease Paternal Grandfather   . Cancer Paternal Grandfather        bone  . Cancer Paternal Grandmother   . Heart disease Paternal Grandmother   . Cancer Father   . Hypertension Father   . Heart attack Father   . Alcohol abuse Father   . Depression Father    . Heart failure Mother   . Diabetes Mother   . Heart disease Mother   . Hyperlipidemia Mother   . Hypertension Mother   . Heart attack Mother   . Peripheral vascular disease Mother   . Depression Mother   . COPD Mother   . Hypertension Sister   . Heart attack Sister   . Stroke Maternal Grandmother     Social History:  reports that she has been smoking cigarettes. She has a 37.00 pack-year smoking history. She has never used smokeless tobacco. She reports that she does not drink alcohol or use drugs.  ROS: A complete review of systems was performed.  All systems are negative except for pertinent findings as noted.  Physical Exam:  Vital signs in last 24 hours: Temp:  [99.6 F (37.6 C)-100 F (37.8 C)] 100 F (37.8 C) (09/23 2253) Pulse Rate:  [84-110] 89 (09/24 0220) Resp:  [16-25] 18 (09/24 0220) BP: (67-101)/(42-80) 86/42 (09/24 0314) SpO2:  [93 %-100 %] 98 % (09/24 0220) Weight:  [86.2 kg] 86.2 kg (09/24 0011) General:  Alert and oriented, she has significant rigors. HEENT: Normocephalic, atraumatic Neck: No JVD or lymphadenopathy Cardiovascular: Tachycardic Lungs: Tachypneic Abdomen: Obese.  Moderate right lower quadrant and right CVA tenderness. Extremities: No edema Neurologic: Grossly intact  Laboratory Data:  Recent Labs    12/14/18 2058  WBC 12.5*  HGB 14.4  HCT 42.2  PLT 221    Recent Labs    12/14/18 2058  NA 129*  K 5.3*  CL 92*  GLUCOSE 245*  BUN 42*  CALCIUM 9.5  CREATININE 2.45*     Results for orders placed or performed during the hospital encounter of 12/14/18 (from the past 24 hour(s))  Urinalysis, Routine w reflex microscopic     Status: Abnormal   Collection Time: 12/14/18  8:30 PM  Result Value Ref Range   Color, Urine YELLOW YELLOW   APPearance CLOUDY (A) CLEAR   Specific Gravity, Urine 1.016 1.005 - 1.030   pH 6.0 5.0 - 8.0   Glucose, UA NEGATIVE NEGATIVE mg/dL   Hgb urine dipstick MODERATE (A) NEGATIVE   Bilirubin Urine  NEGATIVE NEGATIVE   Ketones, ur NEGATIVE NEGATIVE mg/dL   Protein, ur 100 (A) NEGATIVE mg/dL   Nitrite NEGATIVE NEGATIVE   Leukocytes,Ua LARGE (A) NEGATIVE   RBC / HPF 0-5 0 - 5 RBC/hpf   WBC, UA 21-50 0 - 5 WBC/hpf   Bacteria, UA MANY (A) NONE SEEN   Squamous Epithelial / LPF 6-10 0 - 5   WBC Clumps PRESENT    Mucus PRESENT   Lipase, blood     Status: None   Collection Time: 12/14/18  8:58 PM  Result Value Ref Range   Lipase 25 11 -  51 U/L  Comprehensive metabolic panel     Status: Abnormal   Collection Time: 12/14/18  8:58 PM  Result Value Ref Range   Sodium 129 (L) 135 - 145 mmol/L   Potassium 5.3 (H) 3.5 - 5.1 mmol/L   Chloride 92 (L) 98 - 111 mmol/L   CO2 22 22 - 32 mmol/L   Glucose, Bld 245 (H) 70 - 99 mg/dL   BUN 42 (H) 6 - 20 mg/dL   Creatinine, Ser 2.45 (H) 0.44 - 1.00 mg/dL   Calcium 9.5 8.9 - 10.3 mg/dL   Total Protein 7.1 6.5 - 8.1 g/dL   Albumin 2.6 (L) 3.5 - 5.0 g/dL   AST 32 15 - 41 U/L   ALT 22 0 - 44 U/L   Alkaline Phosphatase 109 38 - 126 U/L   Total Bilirubin 1.1 0.3 - 1.2 mg/dL   GFR calc non Af Amer 22 (L) >60 mL/min   GFR calc Af Amer 25 (L) >60 mL/min   Anion gap 15 5 - 15  CBC     Status: Abnormal   Collection Time: 12/14/18  8:58 PM  Result Value Ref Range   WBC 12.5 (H) 4.0 - 10.5 K/uL   RBC 4.35 3.87 - 5.11 MIL/uL   Hemoglobin 14.4 12.0 - 15.0 g/dL   HCT 42.2 36.0 - 46.0 %   MCV 97.0 80.0 - 100.0 fL   MCH 33.1 26.0 - 34.0 pg   MCHC 34.1 30.0 - 36.0 g/dL   RDW 14.9 11.5 - 15.5 %   Platelets 221 150 - 400 K/uL   nRBC 0.0 0.0 - 0.2 %  I-Stat beta hCG blood, ED     Status: Abnormal   Collection Time: 12/14/18  9:50 PM  Result Value Ref Range   I-stat hCG, quantitative 63.1 (H) <5 mIU/mL   Comment 3          Lactic acid, plasma     Status: Abnormal   Collection Time: 12/14/18 11:39 PM  Result Value Ref Range   Lactic Acid, Venous 2.2 (HH) 0.5 - 1.9 mmol/L  SARS Coronavirus 2 [facility] order, Performed in [place] hospital lab)  Nasopharyngeal Nasopharyngeal Swab     Status: None   Collection Time: 12/15/18 12:19 AM   Specimen: Nasopharyngeal Swab  Result Value Ref Range   SARS Coronavirus 2 NEGATIVE NEGATIVE  Lactic acid, plasma     Status: Abnormal   Collection Time: 12/15/18  2:26 AM  Result Value Ref Range   Lactic Acid, Venous 3.4 (HH) 0.5 - 1.9 mmol/L   Recent Results (from the past 240 hour(s))  SARS Coronavirus 2 [facility] order, Performed in [facility] hospital lab) Nasopharyngeal Nasopharyngeal Swab     Status: None   Collection Time: 12/15/18 12:19 AM   Specimen: Nasopharyngeal Swab  Result Value Ref Range Status   SARS Coronavirus 2 NEGATIVE NEGATIVE Final    Comment: (NOTE) If result is NEGATIVE SARS-CoV-2 target nucleic acids are NOT DETECTED. The SARS-CoV-2 RNA is generally detectable in upper and lower  respiratory specimens during the acute phase of infection. The lowest  concentration of SARS-CoV-2 viral copies this assay can detect is 250  copies / mL. A negative result does not preclude SARS-CoV-2 infection  and should not be used as the sole basis for treatment or other  patient management decisions.  A negative result may occur with  improper specimen collection / handling, submission of specimen other  than nasopharyngeal swab, presence of viral mutation(s) within  the  areas targeted by this assay, and inadequate number of viral copies  (<250 copies / mL). A negative result must be combined with clinical  observations, patient history, and epidemiological information. If result is POSITIVE SARS-CoV-2 target nucleic acids are DETECTED. The SARS-CoV-2 RNA is generally detectable in upper and lower  respiratory specimens dur ing the acute phase of infection.  Positive  results are indicative of active infection with SARS-CoV-2.  Clinical  correlation with patient history and other diagnostic information is  necessary to determine patient infection status.  Positive results do  not  rule out bacterial infection or co-infection with other viruses. If result is PRESUMPTIVE POSTIVE SARS-CoV-2 nucleic acids MAY BE PRESENT.   A presumptive positive result was obtained on the submitted specimen  and confirmed on repeat testing.  While 2019 novel coronavirus  (SARS-CoV-2) nucleic acids may be present in the submitted sample  additional confirmatory testing may be necessary for epidemiological  and / or clinical management purposes  to differentiate between  SARS-CoV-2 and other Sarbecovirus currently known to infect humans.  If clinically indicated additional testing with an alternate test  methodology [id number]) is advised. The SARS-CoV-2 RNA is generally  detectable in upper and lower respiratory sp ecimens during the acute  phase of infection. The expected result is Negative. Fact Sheet for Patients:  StrictlyIdeas.no Fact Sheet for Healthcare Providers: BankingDealers.co.za This test is not yet approved or cleared by the [place] FDA and has been authorized for detection and/or diagnosis of SARS-CoV-2 by FDA under an Emergency Use Authorization (EUA).  This EUA will remain in effect (meaning this test can be used) for the duration of the COVID-19 declaration under Section 564(b)(1) of the Act, 21 U.S.C. section 360bbb-3(b)(1), unless the authorization is terminated or revoked sooner. Performed at [facility], [place] [address], [place], [place] [address]     Renal Function: Recent Labs    12/14/18 2058  CREATININE 2.45*   Estimated Creatinine Clearance: 27.4 mL/min (A) (by C-G formula based on SCr of 2.45 mg/dL (H)).  Radiologic Imaging: Dg Chest Port 1 View  Result Date: 12/14/2018 CLINICAL DATA:  Vomiting EXAM: PORTABLE CHEST 1 VIEW COMPARISON:  12/23/2017 FINDINGS: Heart is borderline in size. Lungs clear. No effusions or edema. Aortic atherosclerosis. No acute bony abnormality. IMPRESSION: Borderline  heart size.  No active disease. Electronically Signed   By: Rolm Baptise M.D.   On: 12/14/2018 23:54   Ct Renal Stone Study  Result Date: 12/15/2018 CLINICAL DATA:  Initial evaluation for acute flank pain, recurrent stone disease suspected. EXAM: CT ABDOMEN AND PELVIS WITHOUT CONTRAST TECHNIQUE: Multidetector CT imaging of the abdomen and pelvis was performed following the standard protocol without IV contrast. COMPARISON:  Prior CT from 12/23/2017. FINDINGS: Lower chest: Mild scattered subsegmental atelectatic changes present within the lung bases. Visualized lungs are otherwise clear. Hepatobiliary: Limited noncontrast evaluation liver is unremarkable. Gallbladder within normal limits. No biliary dilatation. Pancreas: Few punctate calcifications seen at the pancreatic head, suggesting sequelae of chronic pancreatitis. Pancreas otherwise unremarkable without acute inflammatory changes. Spleen: Spleen within normal limits. Adrenals/Urinary Tract: Left adrenal gland normal. 3.1 cm right adrenal adenoma, similar to previous. Right kidney asymmetrically enlarged as compared to the left. 10 mm calcific density overlying the region of the distal right ureter felt to be most consistent with an obstructive stone. Secondary moderate right hydroureteronephrosis with prominent perinephric and periureteral fat stranding. Additional nonobstructive calculi present within the right kidney, largest of which measures 5-6 mm at  the lower pole. 3 mm nonobstructive calculus present within the interpolar left kidney. No obstructive calculi seen along the course of the left renal collecting system with no left-sided hydronephrosis or hydroureter. Focal cortical scarring with parenchymal calcification noted at the left kidney. Layering calcification noted within the calyceal diverticulum at the lower pole. Partially distended bladder within normal limits. No layering stones within the bladder lumen. Stomach/Bowel: Stomach within  normal limits. No evidence for bowel obstruction. Negative appendix. Colonic diverticulosis without evidence for acute diverticulitis. No acute inflammatory changes seen about the bowels. Vascular/Lymphatic: Moderate aorto bi-iliac atherosclerotic disease. No aneurysm. Few scattered periaortic and aortocaval nodes noted within the upper abdomen, measuring up to 13 mm (series 3, image 34, 35, 39), mildly increased from previous. No other adenopathy. Reproductive: IUD in place within the uterus. Uterus and ovaries otherwise unremarkable. Other: Small fat containing paraumbilical hernia without associated inflammation. No free air or fluid. Musculoskeletal: No acute osseous finding. No discrete lytic or blastic osseous lesions. Multilevel facet arthropathy noted within the lower lumbar spine. IMPRESSION: 1. 10 mm obstructive stone within the distal right ureter with secondary moderate left hydroureteronephrosis. 2. Additional bilateral nonobstructive nephrolithiasis as above. 3. Colonic diverticulosis without evidence for acute diverticulitis. 4. Mildly enlarged periaortic and aortocaval adenopathy within the upper abdomen, mildly increased from previous. Finding is indeterminate, but could be reactive in nature. Attention at follow-up. 5. Moderate aorto bi-iliac atherosclerotic disease. Electronically Signed   By: Jeannine Boga M.D.   On: 12/15/2018 02:46    I independently reviewed the above imaging studies.  Impression/Assessment:  11 mm right distal ureteral stone with subsequent pyelonephrosis/sepsis  Plan:  I have discussed urgent management of the stone with the patient, who will be admitted to the critical care service.  She has had infected stones before.  We have discussed urgent cystoscopy and double-J stent placement, followed by continued resuscitation and pressor support, with eventual management of this stone following adequate treatment of her urinary tract infection.  She understands  the dire nature of her condition as well as the need for this stent placement and desires to proceed.

## 2018-12-15 NOTE — Progress Notes (Signed)
  Echocardiogram 2D Echocardiogram has been performed.  Pamela Carney 12/15/2018, 10:35 AM

## 2018-12-15 NOTE — ED Provider Notes (Signed)
[facility] EMERGENCY DEPARTMENT Provider Note   CSN: [id number] Arrival date & time: 12/14/18  2026     History   Chief Complaint Chief Complaint  Patient presents with  . Abdominal Pain  . Emesis    HPI Pamela Carney is a 53 y.o. female.     The history is provided by the patient.  Abdominal Pain Pain location:  Generalized Pain quality: aching   Pain severity:  Moderate Onset quality:  Gradual Timing:  Constant Progression:  Worsening Chronicity:  New Relieved by:  Nothing Worsened by:  Palpation Associated symptoms: cough, diarrhea, dysuria, fatigue, fever, shortness of breath and vomiting   Emesis Associated symptoms: abdominal pain, cough, diarrhea and fever   Patient with history of bipolar, diabetes, previous kidney stones presents with multiple complaints.  She reports about a week ago she began having headaches.  Approximately 3 days ago she began having generalized abdominal pain with nausea and vomiting.  She reports fevers at home.  She also reports cough and shortness of breath.  She reports recent diarrhea several days ago.  She reports urinary frequency and dysuria  LMP several years ago Past Medical History:  Diagnosis Date  . Abnormal Pap smear   . AKI (acute kidney injury) ([place]) 12/28/2017  . Anxiety   . Arthritis    bil knees, neck  . Bipolar 1 disorder ([place])   . Cataract    Mild  . Cholelithiasis 12/23/2017   noted on CT renal, pt unaware  . Chronic kidney disease (CKD), stage III (moderate) (HCC)   . Cystitis   . Diabetes mellitus without complication ([place])   . Genital warts    Hx of genital  . GERD (gastroesophageal reflux disease)   . Heart murmur    childhood  . Hematuria   . History of blood transfusion   . History of ectopic pregnancy   . History of gestational diabetes   . History of kidney stones   . History of sepsis    after ectopic pregnancy  . Hypertension   . Idiopathic peripheral neuropathy    both feet  . Leg ulcer, left ([place])   . Low back pain   . Migraines   . Obesity   . Oral candidiasis   . Pneumonia   . PONV (postoperative nausea and vomiting)    prolonged sedation  . Recurrent UTI   . 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 Sigmoid diverticulosis 12/23/2017   noted on CT renal, pt unaware  . Ulcer of foot ([place])    Left  . Weakness     Patient Active Problem List   Diagnosis Date Noted  . New onset type 2 diabetes mellitus ([employer]) 02/01/2018  . Bipolar 1 disorder ([place]) 02/01/2018  . IUD (intrauterine device) in place 02/01/2018  . Obesity 02/01/2018  . Healthcare maintenance 02/01/2018  . CKD (chronic kidney disease), stage III ([place]) 12/28/2017  . 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 Acute cystitis with hematuria   . Chronic interstitial nephritis 12/24/2017  . Generalized weakness 12/24/2017  . Sepsis ([place]) 12/24/2017  . Hypokalemia 12/24/2017  . Oral candidiasis 12/24/2017  . Neuropathy involving both lower extremities 01/31/2014  . Perimenopausal 12/29/2012  . Family planning, IUD (intrauterine device) check/reinsertion/removal 12/28/2012  . Pain 12/27/2012    Past Surgical History:  Procedure Laterality Date  . CERVICAL CONE BIOPSY    . CESAREAN SECTION  x3  . CYSTOSCOPY/URETEROSCOPY/HOLMIUM LASER/STENT PLACEMENT Right 02/04/2018   Procedure: CYSTOSCOPY/URETEROSCOPY/RETROGRADE PYELOGRAM/HOLMIUM LASER/STENT PLACEMENT;  Surgeon: Alexis Frock, MD;  Location: [facility];  Service: Urology;  Laterality: Right;  . CYSTOSCOPY/URETEROSCOPY/HOLMIUM LASER/STENT PLACEMENT Right 02/07/2018   Procedure: CYSTOSCOPY LEFT STENT EXCHANGE;  Surgeon: Alexis Frock, MD;  Location: [facility];  Service: Urology;  Laterality: Right;  . DILATION AND CURETTAGE OF UTERUS    . ECTOPIC PREGNANCY SURGERY    . IR NEPHROSTOMY PLACEMENT LEFT  12/25/2017  . NEPHROLITHOTOMY Left 02/04/2018    Procedure: NEPHROLITHOTOMY PERCUTANEOUS;  Surgeon: Alexis Frock, MD;  Location: [facility];  Service: Urology;  Laterality: Left;  3 HRS  . NEPHROLITHOTOMY Left 02/07/2018   Procedure: SECOND LOOK NEPHROLITHOTOMY PERCUTANEOUS;  Surgeon: Alexis Frock, MD;  Location: [facility];  Service: Urology;  Laterality: Left;  2 HRS  . OVARIAN CYST REMOVAL    . UNILATERAL SALPINGECTOMY     Pt unsure of which fallopian tube was removed  . WISDOM TOOTH EXTRACTION       OB History    Gravida  7   Para  3   Term      Preterm  3   AB  2   Living  3     SAB  1   TAB      Ectopic  1   Multiple      Live Births  5            Home Medications    Prior to Admission medications   Medication Sig Start Date End Date Taking? Authorizing Provider  Blood Glucose Monitoring Suppl (ONETOUCH VERIO) w/Device KIT Use to check blood sugars every morning fasting and 2 hours after largest meal 02/02/18   Danford, Katy D, NP  glucose blood (ONETOUCH VERIO) test strip Use to check blood sugars every morning fasting and 2 hours after largest meal 02/02/18   Danford, Katy D, NP  insulin degludec (TRESIBA) 100 UNIT/ML SOPN FlexTouch Pen Inject 0.2 mLs (20 Units total) into the skin daily. 02/02/18   Danford, Valetta Fuller D, NP  Insulin Pen Needle (PEN NEEDLES) 31G X 8 MM MISC 1 each by Does not apply route daily. 02/23/18   Danford, Valetta Fuller D, NP  levonorgestrel (MIRENA) 20 MCG/24HR IUD 1 each by Intrauterine route once.    [provider]  metFORMIN (GLUCOPHAGE) 500 MG tablet Take 1 tablet (500 mg total) by mouth 2 (two) times daily with a meal. PATIENT MUST HAVE OFFICE VISIT PRIOR TO ANY FURTHER REFILLS! 09/05/18   Danford, Berna Spare, NP  Multiple Vitamins-Minerals (ONE-A-DAY WOMENS 50+ ADVANTAGE) TABS Take 1 tablet by mouth daily.    [provider]  ONE TOUCH LANCETS MISC Use to check blood sugars every morning fasting and 2 hours after largest meal 02/02/18   Danford, Berna Spare, NP    Family History  Family History  Problem Relation Age of Onset  . Diabetes Paternal Grandfather   . COPD Paternal Grandfather   . Heart disease Paternal Grandfather   . Cancer Paternal Grandfather        bone  . Cancer Paternal Grandmother   . Heart disease Paternal Grandmother   . Cancer Father   . Hypertension Father   . Heart attack Father   . Alcohol abuse Father   . Depression Father   . Heart failure Mother   . Diabetes Mother   . Heart disease Mother   . Hyperlipidemia Mother   . Hypertension Mother   . Heart attack  Mother   . Peripheral vascular disease Mother   . Depression Mother   . COPD Mother   . Hypertension Sister   . Heart attack Sister   . Stroke Maternal Grandmother     Social History Social History   Tobacco Use  . Smoking status: Current Every Day Smoker    Packs/day: 1.00    Years: 37.00    Pack years: 37.00    Types: Cigarettes  . Smokeless tobacco: Never Used  Substance Use Topics  . Alcohol use: No    Alcohol/week: 0.0 standard drinks  . Drug use: No     Allergies   Sulfa antibiotics, Latex, and Tape   Review of Systems Review of Systems  Constitutional: Positive for fatigue and fever.  Respiratory: Positive for cough and shortness of breath.   Gastrointestinal: Positive for abdominal pain, diarrhea and vomiting.  Genitourinary: Positive for dysuria and frequency.  Musculoskeletal: Positive for back pain.  All other systems reviewed and are negative.    Physical Exam Updated Vital Signs BP (!) 70/48 (BP Location: Right Arm)   Pulse (!) 110   Temp 100 F (37.8 C) (Oral)   Resp 18   Ht 1.575 m (_0 )   Wt 86.2 kg   SpO2 97%   BMI 34.75 kg/m   Physical Exam CONSTITUTIONAL: Well developed, ill-appearing HEAD: Normocephalic/atraumatic EYES: EOMI/PERRL ENMT: Mask in place NECK: supple no meningeal signs SPINE/BACK:entire spine nontender CV: S1/S2 noted, no murmurs/rubs/gallops noted LUNGS: Lungs are clear to auscultation bilaterally,  no apparent distress ABDOMEN: soft, diffuse mild tenderness, no rebound or guarding, bowel sounds noted throughout abdomen GU:no cva tenderness NEURO: Pt is awake/alert/appropriate, moves all extremitiesx4.  No facial droop.   EXTREMITIES: pulses normal/equal, full ROM SKIN: warm, color normal PSYCH: no abnormalities of mood noted, alert and oriented to situation   ED Treatments / Results  Labs (all labs ordered are listed, but only abnormal results are displayed) Labs Reviewed  COMPREHENSIVE METABOLIC PANEL - Abnormal; Notable for the following components:      Result Value   Sodium 129 (*)    Potassium 5.3 (*)    Chloride 92 (*)    Glucose, Bld 245 (*)    BUN 42 (*)    Creatinine, Ser 2.45 (*)    Albumin 2.6 (*)    GFR calc non Af Amer 22 (*)    GFR calc Af Amer 25 (*)    All other components within normal limits  CBC - Abnormal; Notable for the following components:   WBC 12.5 (*)    All other components within normal limits  URINALYSIS, ROUTINE W REFLEX MICROSCOPIC - Abnormal; Notable for the following components:   APPearance CLOUDY (*)    Hgb urine dipstick MODERATE (*)    Protein, ur 100 (*)    Leukocytes,Ua LARGE (*)    Bacteria, UA MANY (*)    All other components within normal limits  LACTIC ACID, PLASMA - Abnormal; Notable for the following components:   Lactic Acid, Venous 2.2 (*)    All other components within normal limits  LACTIC ACID, PLASMA - Abnormal; Notable for the following components:   Lactic Acid, Venous 3.4 (*)    All other components within normal limits  I-STAT BETA HCG BLOOD, ED ([facility], [facility], [facility] ONLY) - Abnormal; Notable for the following components:   I-stat hCG, quantitative 63.1 (*)    All other components within normal limits  SARS CORONAVIRUS 2 (HOSPITAL ORDER, [facility]  [facility])  CULTURE, BLOOD (ROUTINE X 2)  CULTURE, BLOOD (ROUTINE X 2)  URINE CULTURE  LIPASE, BLOOD    EKG EKG Interpretation  Date/Time:  Thursday  December 15 2018 00:13:46 EDT Ventricular Rate:  102 PR Interval:    QRS Duration: 102 QT Interval:  334 QTC Calculation: 435 R Axis:   121 Text Interpretation:  Sinus tachycardia Ventricular premature complex Inferior infarct, age indeterminate No significant change since last tracing Confirmed by Ripley Fraise ([id number]) on 12/15/2018 12:20:44 AM   Radiology Dg Chest Port 1 View  Result Date: 12/14/2018 CLINICAL DATA:  Vomiting EXAM: PORTABLE CHEST 1 VIEW COMPARISON:  12/23/2017 FINDINGS: Heart is borderline in size. Lungs clear. No effusions or edema. Aortic atherosclerosis. No acute bony abnormality. IMPRESSION: Borderline heart size.  No active disease. Electronically Signed   By: Rolm Baptise M.D.   On: 12/14/2018 23:54    Procedures .Critical Care Performed by: Ripley Fraise, MD Authorized by: Ripley Fraise, MD   Critical care provider statement:    Critical care time (minutes):  60   Critical care start time:  12/15/2018 12:15 AM   Critical care end time:  12/15/2018 1:15 AM   Critical care time was exclusive of:  Separately billable procedures and treating other patients   Critical care was necessary to treat or prevent imminent or life-threatening deterioration of the following conditions:  Sepsis, shock, dehydration and circulatory failure   Critical care was time spent personally by me on the following activities:  Evaluation of patient's response to treatment, examination of patient, development of treatment plan with patient or surrogate, pulse oximetry, re-evaluation of patient's condition, ordering and review of radiographic studies, ordering and review of laboratory studies, ordering and performing treatments and interventions, review of old charts, discussions with consultants and obtaining history from patient or surrogate   I assumed direction of critical care for this patient from another provider in my specialty: no      Medications Ordered in ED Medications   sodium chloride flush (NS) 0.9 % injection 3 mL (3 mLs Intravenous Not Given 12/14/18 2339)  cefTRIAXone (ROCEPHIN) 1 g in sodium chloride 0.9 % 100 mL IVPB (0 g Intravenous Stopped 12/15/18 0115)  ondansetron (ZOFRAN-ODT) disintegrating tablet 4 mg (4 mg Oral Given 12/14/18 2050)  lactated ringers bolus 1,000 mL (0 mLs Intravenous Stopped 12/15/18 0132)    And  lactated ringers bolus 1,000 mL (0 mLs Intravenous Stopped 12/15/18 0132)    And  lactated ringers bolus 1,000 mL (0 mLs Intravenous Stopped 12/15/18 0143)    And  lactated ringers bolus 500 mL (0 mLs Intravenous Stopped 12/15/18 0115)     Initial Impression / Assessment and Plan / ED Course  I have reviewed the triage vital signs and the nursing notes.  Pertinent labs & imaging results that were available during my care of the patient were reviewed by me and considered in my medical decision making (see chart for details).        12:18 AM Patient presents with nausea vomiting abdominal pain for the past 2 to 3 days. While waiting in the waiting room patient became hypotensive and worsened.  She is tachycardic and is trending towards a fever. Patient with evidence of urinary tract infection.  She does have previous history of kidney stones requiring nephrostomy tube. I am concerned the patient has sepsis due to infected stone. She will require CT imaging.  Will follow closely Code sepsis initiated 12:30 AM Blood pressures are improving, systolic now above  100.  She does have evidence of acute renal failure likely due to volume loss/dehydration CT imaging pending at this time 2:39 AM Patient had drops in her blood pressure but is now improving after 30 cc/kg bolus of fluids completed 3:16 AM Patient found to have a 10 mm obstructing stone Discussed the case with Dr. Diona Fanti with urology, he plans to take to the operating room for a ureteral stent placement Initially was going to admit patient to the medical service, but her  blood pressures are now trending into the 80s. Her lactate is also increasing despite over 3 L of fluid I discussed the case with critical care doctor Dr. Jimmy Footman who will send team to evaluate the patient for admission.   Final Clinical Impressions(s) / ED Diagnoses   Final diagnoses:  Dehydration  AKI (acute kidney injury) ([place])  Septic shock ([place])  Ureteral stone with hydronephrosis    ED Discharge Orders    None       Ripley Fraise, MD 12/15/18 [phone]

## 2018-12-15 NOTE — Progress Notes (Addendum)
PCCM update  Spoke with RN Pt off of Levophed gtt Pain is controlled Still NPO  Blood cultures positive for Ecoli >>continue on current Antibiotics will f/u sensitivities  Lipid panel: LDL 99 Chol 183 TG 288 HDL 26 Given that pt is >40 yrs of age has a h/o DM and has evidence of atherosclerotic disease on imaging will start on statin to be continued on discharge  Will advance diet (diabetic) And increase insulin coverage with long acting weight based at night and continuing ISS Goal BG 140-180 mg/dl  Signed Dr Seward [facility] Critical Care Locums

## 2018-12-15 NOTE — Anesthesia Procedure Notes (Signed)
Procedure Name: Intubation Date/Time: 12/15/2018 4:42 AM Performed by: Claris Che, CRNA Pre-anesthesia Checklist: Patient identified, Emergency Drugs available, Suction available, Patient being monitored and Timeout performed Patient Re-evaluated:Patient Re-evaluated prior to induction Oxygen Delivery Method: Circle system utilized Preoxygenation: Pre-oxygenation with 100% oxygen Induction Type: IV induction, Rapid sequence and Cricoid Pressure applied Laryngoscope Size: Mac and 3 Grade View: Grade II Tube type: Oral Tube size: 7.5 mm Number of attempts: 1 Airway Equipment and Method: Stylet Placement Confirmation: ETT inserted through vocal cords under direct vision,  positive ETCO2 and breath sounds checked- equal and bilateral Secured at: 23 cm Tube secured with: Tape Dental Injury: Teeth and Oropharynx as per pre-operative assessment

## 2018-12-15 NOTE — Progress Notes (Signed)
PHARMACY - PHYSICIAN COMMUNICATION CRITICAL VALUE ALERT - BLOOD CULTURE IDENTIFICATION (BCID)  Results for orders placed or performed during the hospital encounter of 12/14/18  Blood Culture ID Panel (Reflexed) (Collected: 12/15/2018 12:16 AM)  Result Value Ref Range   Enterococcus species NOT DETECTED NOT DETECTED   Listeria monocytogenes NOT DETECTED NOT DETECTED   Staphylococcus species NOT DETECTED NOT DETECTED   Staphylococcus aureus (BCID) NOT DETECTED NOT DETECTED   Streptococcus species NOT DETECTED NOT DETECTED   Streptococcus agalactiae NOT DETECTED NOT DETECTED   Streptococcus pneumoniae NOT DETECTED NOT DETECTED   Streptococcus pyogenes NOT DETECTED NOT DETECTED   Acinetobacter baumannii NOT DETECTED NOT DETECTED   Enterobacteriaceae species DETECTED (A) NOT DETECTED   Enterobacter cloacae complex NOT DETECTED NOT DETECTED   Escherichia coli DETECTED (A) NOT DETECTED   Klebsiella oxytoca NOT DETECTED NOT DETECTED   Klebsiella pneumoniae NOT DETECTED NOT DETECTED   Proteus species NOT DETECTED NOT DETECTED   Serratia marcescens NOT DETECTED NOT DETECTED   Carbapenem resistance NOT DETECTED NOT DETECTED   Haemophilus influenzae NOT DETECTED NOT DETECTED   Neisseria meningitidis NOT DETECTED NOT DETECTED   Pseudomonas aeruginosa NOT DETECTED NOT DETECTED   Candida albicans NOT DETECTED NOT DETECTED   Candida glabrata NOT DETECTED NOT DETECTED   Candida krusei NOT DETECTED NOT DETECTED   Candida parapsilosis NOT DETECTED NOT DETECTED   Candida tropicalis NOT DETECTED NOT DETECTED    Name of physician (or Provider) Contacted: Bowser NP Changes to prescribed antibiotics required: Increase ceftriaxone to 2g q24 hours  Erin Hearing PharmD., BCPS Clinical Pharmacist 12/15/2018 4:42 PM

## 2018-12-15 NOTE — Progress Notes (Addendum)
Patient asking RN about location of her belongings.  Patient stated she had a purse and clothing when she came to the ED.   RN called PACU and spoke with Jake Bathe RN, no belongings in PACU and per log it does not appear patient went to PACU after procedure.   Called OR, was informed that the OR does not receive patient belongings so the belongings were not located in the OR.  Called the ED, spoke with [facility].  Becky advised if any family was with the patient that the ED would have sent the belongings home with the family member.  Becky did check the ED and no belongings for Mrs. Gruman were present.   Called security and patient's belongings not located in security.    Patient did state that her daughter in law Crystal Meryl Crutch was with her in the ED.  Discussed with patient that her belongings may be with her daughter in law.  Asked the patient if she had [place] Hobson's phone # and she stated she does not have it memorized as it is in her cell phone which is in her purse.   Looked in [place] and did not see any Psychologist, counselling for [employer].   Patient's emergency contact is Shelva Majestic, patient states this is her husband however they are separated.  I asked patient if I called Mr. Schwager if he would be able to give me Crystal's phone number and she stated no he would not.   At this time, unable to locate patient's belongings.

## 2018-12-15 NOTE — ED Notes (Signed)
Transported to OR

## 2018-12-15 NOTE — Anesthesia Preprocedure Evaluation (Addendum)
Anesthesia Evaluation  Patient identified by MRN, date of birth, ID band Patient awake    Reviewed: Allergy & Precautions, NPO status , Patient's Chart, lab work & pertinent test results  History of Anesthesia Complications (+) PONV and history of anesthetic complications  Airway Mallampati: III  TM Distance: >3 FB     Dental  (+) Dental Advisory Given, Edentulous Upper, Missing, Poor Dentition,    Pulmonary neg recent URI, Current Smoker and Patient abstained from smoking.,    breath sounds clear to auscultation       Cardiovascular hypertension,  Rhythm:Regular     Neuro/Psych  Headaches, PSYCHIATRIC DISORDERS Anxiety Bipolar Disorder  Neuromuscular disease    GI/Hepatic GERD  Controlled,  Endo/Other  diabetes, Type 2, Insulin Dependent  Renal/GU ARF and CRFRenal disease     Musculoskeletal   Abdominal   Peds  Hematology   Anesthesia Other Findings septic  Reproductive/Obstetrics                            Anesthesia Physical Anesthesia Plan  ASA: III and emergent  Anesthesia Plan: General   Post-op Pain Management:    Induction: Intravenous, Rapid sequence and Cricoid pressure planned  PONV Risk Score and Plan: 3 and Ondansetron and Dexamethasone  Airway Management Planned: Oral ETT  Additional Equipment: Arterial line, CVP and Ultrasound Guidance Line Placement  Intra-op Plan:   Post-operative Plan: Possible Post-op intubation/ventilation  Informed Consent: I have reviewed the patients History and Physical, chart, labs and discussed the procedure including the risks, benefits and alternatives for the proposed anesthesia with the patient or authorized representative who has indicated his/her understanding and acceptance.     Dental advisory given  Plan Discussed with: CRNA and Surgeon  Anesthesia Plan Comments:         Anesthesia Quick Evaluation

## 2018-12-15 NOTE — ED Notes (Signed)
ED TO INPATIENT HANDOFF REPORT  ED Nurse Name and Phone #:  Clydene Laming [phone]  S Name/Age/Gender Rosemary Holms 53 y.o. female Room/Bed: TRAAC/TRAAC  Code Status   Code Status: Full Code  Home/SNF/Other Home {Patient oriented x4 Is this baseline? YES  Triage Complete: Triage complete  Chief Complaint N/V, fever, Gen weakness  Triage Note Patient reports low abdominal pain with emesis , diarrhea , low back pain , fever and headache for several days .    Allergies Allergies  Allergen Reactions  . Sulfa Antibiotics Hives and Swelling    Swelling site not recalled  . Latex Rash  . Tape Rash    Not tolerated well    Level of Care/Admitting Diagnosis ED Disposition    ED Disposition Condition [facility] Area: [place] [[id number]]  Level of Care: ICU [6]  Covid Evaluation: Confirmed COVID Negative  Diagnosis: Septic shock due to urinary tract infection [id number]  Admitting Physician: Kandice Hams [id number]  Attending Physician: Kandice Hams [id number]  Estimated length of stay: 3 - 4 days  Certification:: I certify this patient will need inpatient services for at least 2 midnights  PT Class (Do Not Modify): Inpatient [101]  PT Acc Code (Do Not Modify): Private [1]       B Medical/Surgery History Past Medical History:  Diagnosis Date  . Abnormal Pap smear   . AKI (acute kidney injury) ([place]) 12/28/2017  . Anxiety   . Arthritis    bil knees, neck  . Bipolar 1 disorder ([place])   . Cataract    Mild  . Cholelithiasis 12/23/2017   noted on CT renal, pt unaware  . Chronic kidney disease (CKD), stage III (moderate) ([facility])   . Cystitis   . Diabetes mellitus without complication ([place])   . Genital warts    Hx of genital  . GERD (gastroesophageal reflux disease)   . Heart murmur    childhood  . Hematuria   . History of blood transfusion   . History of ectopic pregnancy   . History of gestational diabetes    . History of kidney stones   . History of sepsis    after ectopic pregnancy  . Hypertension   . Idiopathic peripheral neuropathy    both feet  . Leg ulcer, left ([place])   . Low back pain   . Migraines   . Obesity   . Oral candidiasis   . Pneumonia   . PONV (postoperative nausea and vomiting)    prolonged sedation  . Recurrent UTI   . 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 Sigmoid diverticulosis 12/23/2017   noted on CT renal, pt unaware  . Ulcer of foot ([place])    Left  . Weakness    Past Surgical History:  Procedure Laterality Date  . CERVICAL CONE BIOPSY    . CESAREAN SECTION     x3  . CYSTOSCOPY/URETEROSCOPY/HOLMIUM LASER/STENT PLACEMENT Right 02/04/2018   Procedure: CYSTOSCOPY/URETEROSCOPY/RETROGRADE PYELOGRAM/HOLMIUM LASER/STENT PLACEMENT;  Surgeon: Alexis Frock, MD;  Location: [facility];  Service: Urology;  Laterality: Right;  . CYSTOSCOPY/URETEROSCOPY/HOLMIUM LASER/STENT PLACEMENT Right 02/07/2018   Procedure: CYSTOSCOPY LEFT STENT EXCHANGE;  Surgeon: Alexis Frock, MD;  Location: [facility];  Service: Urology;  Laterality: Right;  . DILATION AND CURETTAGE OF UTERUS    . ECTOPIC PREGNANCY SURGERY    . IR NEPHROSTOMY PLACEMENT LEFT  12/25/2017  . NEPHROLITHOTOMY Left 02/04/2018   Procedure: NEPHROLITHOTOMY PERCUTANEOUS;  Surgeon: Alexis Frock, MD;  Location: [facility];  Service: Urology;  Laterality: Left;  3 HRS  . NEPHROLITHOTOMY Left 02/07/2018   Procedure: SECOND LOOK NEPHROLITHOTOMY PERCUTANEOUS;  Surgeon: Alexis Frock, MD;  Location: [facility];  Service: Urology;  Laterality: Left;  2 HRS  . OVARIAN CYST REMOVAL    . UNILATERAL SALPINGECTOMY     Pt unsure of which fallopian tube was removed  . WISDOM TOOTH EXTRACTION       A IV Location/Drains/Wounds Patient Lines/Drains/Airways Status   Active Line/Drains/Airways    Name:   Placement date:   Placement time:   Site:   Days:    Peripheral IV 12/14/18 Left Forearm   12/14/18    2356    Forearm   1   Peripheral IV 12/15/18 Right Forearm   12/15/18    0017    Forearm   less than 1   Nephrostomy Left 14 Fr.   02/04/18    1430    Left   314   Ureteral Drain/Stent Right ureter 6 Fr.   02/04/18    1242    Right ureter   314   Ureteral Drain/Stent Left ureter 6 Fr.   02/07/18    1629    Left ureter   311   Incision (Closed) 02/04/18 Back Left   02/04/18    1440     314   Incision (Closed) 02/07/18 Back Left   02/07/18    1654     311          Intake/Output Last 24 hours  Intake/Output Summary (Last 24 hours) at 12/15/2018 0335 Last data filed at 12/15/2018 0149 Gross per 24 hour  Intake 3500 ml  Output 0 ml  Net 3500 ml    Labs/Imaging Results for orders placed or performed during the hospital encounter of 12/14/18 (from the past 48 hour(s))  Urinalysis, Routine w reflex microscopic     Status: Abnormal   Collection Time: 12/14/18  8:30 PM  Result Value Ref Range   Color, Urine YELLOW YELLOW   APPearance CLOUDY (A) CLEAR   Specific Gravity, Urine 1.016 1.005 - 1.030   pH 6.0 5.0 - 8.0   Glucose, UA NEGATIVE NEGATIVE mg/dL   Hgb urine dipstick MODERATE (A) NEGATIVE   Bilirubin Urine NEGATIVE NEGATIVE   Ketones, ur NEGATIVE NEGATIVE mg/dL   Protein, ur 100 (A) NEGATIVE mg/dL   Nitrite NEGATIVE NEGATIVE   Leukocytes,Ua LARGE (A) NEGATIVE   RBC / HPF 0-5 0 - 5 RBC/hpf   WBC, UA 21-50 0 - 5 WBC/hpf   Bacteria, UA MANY (A) NONE SEEN   Squamous Epithelial / LPF 6-10 0 - 5   WBC Clumps PRESENT    Mucus PRESENT     Comment: Performed at [facility], [address], [place], [place] [address]  Lipase, blood     Status: None   Collection Time: 12/14/18  8:58 PM  Result Value Ref Range   Lipase 25 11 - 51 U/L    Comment: Performed at [facility] [address] [place], [place] [address]  Comprehensive metabolic panel     Status: Abnormal   Collection Time: 12/14/18  8:58 PM  Result Value Ref  Range   Sodium 129 (L) 135 - 145 mmol/L   Potassium 5.3 (H) 3.5 - 5.1 mmol/L   Chloride 92 (L) 98 - 111 mmol/L  CO2 22 22 - 32 mmol/L   Glucose, Bld 245 (H) 70 - 99 mg/dL   BUN 42 (H) 6 - 20 mg/dL   Creatinine, Ser 2.45 (H) 0.44 - 1.00 mg/dL   Calcium 9.5 8.9 - 10.3 mg/dL   Total Protein 7.1 6.5 - 8.1 g/dL   Albumin 2.6 (L) 3.5 - 5.0 g/dL   AST 32 15 - 41 U/L   ALT 22 0 - 44 U/L   Alkaline Phosphatase 109 38 - 126 U/L   Total Bilirubin 1.1 0.3 - 1.2 mg/dL   GFR calc non Af Amer 22 (L) >60 mL/min   GFR calc Af Amer 25 (L) >60 mL/min   Anion gap 15 5 - 15    Comment: Performed at [facility] [address], [place], [place] [address]  CBC     Status: Abnormal   Collection Time: 12/14/18  8:58 PM  Result Value Ref Range   WBC 12.5 (H) 4.0 - 10.5 K/uL   RBC 4.35 3.87 - 5.11 MIL/uL   Hemoglobin 14.4 12.0 - 15.0 g/dL   HCT 42.2 36.0 - 46.0 %   MCV 97.0 80.0 - 100.0 fL   MCH 33.1 26.0 - 34.0 pg   MCHC 34.1 30.0 - 36.0 g/dL   RDW 14.9 11.5 - 15.5 %   Platelets 221 150 - 400 K/uL   nRBC 0.0 0.0 - 0.2 %    Comment: Performed at [facility], [place] [address], [place], [place] [address]  I-Stat beta hCG blood, ED     Status: Abnormal   Collection Time: 12/14/18  9:50 PM  Result Value Ref Range   I-stat hCG, quantitative 63.1 (H) <5 mIU/mL   Comment 3            Comment:   GEST. AGE      CONC.  (mIU/mL)   <=1 WEEK        5 - 50     2 WEEKS       50 - 500     3 WEEKS       100 - 10,000     4 WEEKS     1,000 - 30,000        FEMALE AND NON-PREGNANT FEMALE:     LESS THAN 5 mIU/mL   Lactic acid, plasma     Status: Abnormal   Collection Time: 12/14/18 11:39 PM  Result Value Ref Range   Lactic Acid, Venous 2.2 (HH) 0.5 - 1.9 mmol/L    Comment: CRITICAL RESULT CALLED TO, READ BACK BY AND VERIFIED WITH: SANGELANG B,RN 12/15/18 0036 WAYK Performed at [facility], [place] [address], [place], [place] [address]   SARS Coronavirus 2 [facility] order, Performed in [facility] hospital lab) Nasopharyngeal Nasopharyngeal Swab     Status: None   Collection Time: 12/15/18 12:19 AM   Specimen: Nasopharyngeal Swab  Result Value Ref Range   SARS Coronavirus 2 NEGATIVE NEGATIVE    Comment: (NOTE) If result is NEGATIVE SARS-CoV-2 target nucleic acids are NOT DETECTED. The SARS-CoV-2 RNA is generally detectable in upper and lower  respiratory specimens during the acute phase of infection. The lowest  concentration of SARS-CoV-2 viral copies this assay can detect is 250  copies / mL. A negative result does not preclude SARS-CoV-2 infection  and should not be used as the sole basis for treatment or other  patient management decisions.  A negative result may occur with  improper specimen  collection / handling, submission of specimen other  than nasopharyngeal swab, presence of viral mutation(s) within the  areas targeted by this assay, and inadequate number of viral copies  (<250 copies / mL). A negative result must be combined with clinical  observations, patient history, and epidemiological information. If result is POSITIVE SARS-CoV-2 target nucleic acids are DETECTED. The SARS-CoV-2 RNA is generally detectable in upper and lower  respiratory specimens dur ing the acute phase of infection.  Positive  results are indicative of active infection with SARS-CoV-2.  Clinical  correlation with patient history and other diagnostic information is  necessary to determine patient infection status.  Positive results do  not rule out bacterial infection or co-infection with other viruses. If result is PRESUMPTIVE POSTIVE SARS-CoV-2 nucleic acids MAY BE PRESENT.   A presumptive positive result was obtained on the submitted specimen  and confirmed on repeat testing.  While 2019 novel coronavirus  (SARS-CoV-2) nucleic acids may be present in the submitted sample  additional confirmatory testing may be necessary for epidemiological  and / or clinical management purposes  to  differentiate between  SARS-CoV-2 and other Sarbecovirus currently known to infect humans.  If clinically indicated additional testing with an alternate test  methodology [id number]) is advised. The SARS-CoV-2 RNA is generally  detectable in upper and lower respiratory sp ecimens during the acute  phase of infection. The expected result is Negative. Fact Sheet for Patients:  StrictlyIdeas.no Fact Sheet for Healthcare Providers: BankingDealers.co.za This test is not yet approved or cleared by the [place] FDA and has been authorized for detection and/or diagnosis of SARS-CoV-2 by FDA under an Emergency Use Authorization (EUA).  This EUA will remain in effect (meaning this test can be used) for the duration of the COVID-19 declaration under Section 564(b)(1) of the Act, 21 U.S.C. section 360bbb-3(b)(1), unless the authorization is terminated or revoked sooner. Performed at [facility], [place] [address] [place], [place] [address]   Lactic acid, plasma     Status: Abnormal   Collection Time: 12/15/18  2:26 AM  Result Value Ref Range   Lactic Acid, Venous 3.4 (HH) 0.5 - 1.9 mmol/L    Comment: CRITICAL VALUE NOTED.  VALUE IS CONSISTENT WITH PREVIOUSLY REPORTED AND CALLED VALUE. Performed at [facility], [place] [address] [place],  [address]    Dg Chest Port 1 View  Result Date: 12/14/2018 CLINICAL DATA:  Vomiting EXAM: PORTABLE CHEST 1 VIEW COMPARISON:  12/23/2017 FINDINGS: Heart is borderline in size. Lungs clear. No effusions or edema. Aortic atherosclerosis. No acute bony abnormality. IMPRESSION: Borderline heart size.  No active disease. Electronically Signed   By: Rolm Baptise M.D.   On: 12/14/2018 23:54   Ct Renal Stone Study  Result Date: 12/15/2018 CLINICAL DATA:  Initial evaluation for acute flank pain, recurrent stone disease suspected. EXAM: CT ABDOMEN AND PELVIS WITHOUT CONTRAST TECHNIQUE: Multidetector CT  imaging of the abdomen and pelvis was performed following the standard protocol without IV contrast. COMPARISON:  Prior CT from 12/23/2017. FINDINGS: Lower chest: Mild scattered subsegmental atelectatic changes present within the lung bases. Visualized lungs are otherwise clear. Hepatobiliary: Limited noncontrast evaluation liver is unremarkable. Gallbladder within normal limits. No biliary dilatation. Pancreas: Few punctate calcifications seen at the pancreatic head, suggesting sequelae of chronic pancreatitis. Pancreas otherwise unremarkable without acute inflammatory changes. Spleen: Spleen within normal limits. Adrenals/Urinary Tract: Left adrenal gland normal. 3.1 cm right adrenal adenoma, similar to previous. Right kidney asymmetrically enlarged as compared to the left.  10 mm calcific density overlying the region of the distal right ureter felt to be most consistent with an obstructive stone. Secondary moderate right hydroureteronephrosis with prominent perinephric and periureteral fat stranding. Additional nonobstructive calculi present within the right kidney, largest of which measures 5-6 mm at the lower pole. 3 mm nonobstructive calculus present within the interpolar left kidney. No obstructive calculi seen along the course of the left renal collecting system with no left-sided hydronephrosis or hydroureter. Focal cortical scarring with parenchymal calcification noted at the left kidney. Layering calcification noted within the calyceal diverticulum at the lower pole. Partially distended bladder within normal limits. No layering stones within the bladder lumen. Stomach/Bowel: Stomach within normal limits. No evidence for bowel obstruction. Negative appendix. Colonic diverticulosis without evidence for acute diverticulitis. No acute inflammatory changes seen about the bowels. Vascular/Lymphatic: Moderate aorto bi-iliac atherosclerotic disease. No aneurysm. Few scattered periaortic and aortocaval nodes noted  within the upper abdomen, measuring up to 13 mm (series 3, image 34, 35, 39), mildly increased from previous. No other adenopathy. Reproductive: IUD in place within the uterus. Uterus and ovaries otherwise unremarkable. Other: Small fat containing paraumbilical hernia without associated inflammation. No free air or fluid. Musculoskeletal: No acute osseous finding. No discrete lytic or blastic osseous lesions. Multilevel facet arthropathy noted within the lower lumbar spine. IMPRESSION: 1. 10 mm obstructive stone within the distal right ureter with secondary moderate left hydroureteronephrosis. 2. Additional bilateral nonobstructive nephrolithiasis as above. 3. Colonic diverticulosis without evidence for acute diverticulitis. 4. Mildly enlarged periaortic and aortocaval adenopathy within the upper abdomen, mildly increased from previous. Finding is indeterminate, but could be reactive in nature. Attention at follow-up. 5. Moderate aorto bi-iliac atherosclerotic disease. Electronically Signed   By: Jeannine Boga M.D.   On: 12/15/2018 02:46    Pending Labs Unresulted Labs (From admission, onward)    Start     Ordered   12/15/18 0335  Blood gas, arterial  ONCE - STAT,   R    Question:  Room air or oxygen  Answer:  Room air   12/15/18 0334   12/15/18 0333  CBC  (heparin)  Once,   STAT    Comments: Baseline for heparin therapy IF NOT ALREADY DRAWN.  Notify MD if PLT < 100 K.    12/15/18 0334   12/15/18 0333  Creatinine, serum  (heparin)  Once,   STAT    Comments: Baseline for heparin therapy IF NOT ALREADY DRAWN.    12/15/18 0334   12/14/18 2339  Blood Culture (routine x 2)  BLOOD CULTURE X 2,   STAT     12/14/18 2339   12/14/18 2339  Urine culture  ONCE - STAT,   STAT     12/14/18 2339          Vitals/Pain Today's Vitals   12/15/18 0245 12/15/18 0300 12/15/18 0314 12/15/18 0315  BP: 98/66 (!) 81/69 (!) 86/42 114/85  Pulse: 76 81  94  Resp: (!) 27 (!) 25  (!) 25  Temp:      TempSrc:       SpO2: 98% 96%  100%  Weight:      Height:      PainSc:        Isolation Precautions No active isolations  Medications Medications  sodium chloride flush (NS) 0.9 % injection 3 mL (3 mLs Intravenous Not Given 12/14/18 2339)  cefTRIAXone (ROCEPHIN) 1 g in sodium chloride 0.9 % 100 mL IVPB (0 g Intravenous Stopped 12/15/18 0115)  sodium  chloride 0.9 % bolus 1,000 mL (1,000 mLs Intravenous New Bag/Given 12/15/18 0332)  heparin injection 5,000 Units (has no administration in time range)  ondansetron (ZOFRAN) injection 4 mg (has no administration in time range)  0.9 %  sodium chloride infusion (has no administration in time range)  phenylephrine (NEOSYNEPHRINE) 10-0.9 MG/250ML-% infusion (has no administration in time range)  ondansetron (ZOFRAN-ODT) disintegrating tablet 4 mg (4 mg Oral Given 12/14/18 2050)  lactated ringers bolus 1,000 mL (0 mLs Intravenous Stopped 12/15/18 0132)    And  lactated ringers bolus 1,000 mL (0 mLs Intravenous Stopped 12/15/18 0132)    And  lactated ringers bolus 1,000 mL (0 mLs Intravenous Stopped 12/15/18 0143)    And  lactated ringers bolus 500 mL (0 mLs Intravenous Stopped 12/15/18 0115)    Mobility walks Low fall risk   Focused Assessments Family notified on pt.'s admission    R Recommendations: See Admitting Provider Note  Report given to:   Additional Notes:

## 2018-12-15 NOTE — Progress Notes (Deleted)
..   NAME:  Pamela Carney, MRN:  [id number], DOB:  May 04, 1965, LOS: 0 ADMISSION DATE:  12/14/2018, CONSULTATION DATE:  12/15/2018 REFERRING MD:  Christy Gentles MD, CHIEF COMPLAINT:  Abdominal pain and vomiting   Brief History   53 yr old F w/ PMHx sig for presents to [facility] w/ abdominal pain and emesis found to have a 11 mm obtsructng stone. S/p 3.5L IVF La 3.5 and SBP in 80 s PCCM asked to admit.  History of present illness   53 yr old obese F w/ PMHx of Bipolar Type 1, DM, CKD stage III, HTN, smoker (37 pack years),  Nephrolithiasis including staghorn calculus that required nephrostomy  Tubes presents to [facility] w/ complaints of abdominal pain accompanied with nausea and vomiting. Per EDP documentation the patient stated that these symptoms began three days ago and have become progressively worse. She also reports subjective fevers, and a non productive cough.   Seen briefly prior to patient being transferred to OR On IVF toxic appearing c/o abdominal pain. Her symptoms have been going for a month and became progressively worse in the last few days.  Past Medical History  .Marland Kitchen Active Ambulatory Problems    Diagnosis Date Noted  . Pain 12/27/2012  . Family planning, IUD (intrauterine device) check/reinsertion/removal 12/28/2012  . Perimenopausal 12/29/2012  . Neuropathy involving both lower extremities 01/31/2014  . Chronic interstitial nephritis 12/24/2017  . Generalized weakness 12/24/2017  . Sepsis ([place]) 12/24/2017  . Hypokalemia 12/24/2017  . Oral candidiasis 12/24/2017  . Acute cystitis with hematuria   . CKD (chronic kidney disease), stage III ([place]) 12/28/2017  . 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 New onset type 2 diabetes mellitus ([place]) 02/01/2018  . Bipolar 1 disorder ([place]) 02/01/2018  . IUD (intrauterine device) in place 02/01/2018  . Obesity 02/01/2018  . Healthcare maintenance 02/01/2018  . Ureteral stone with hydronephrosis   . AKI (acute kidney injury) [facility])    Resolved Ambulatory  Problems    Diagnosis Date Noted  . Acute lower UTI 12/24/2017  . Metabolic acidosis 0000000  . Acute kidney failure ([place]) 12/24/2017   Past Medical History:  Diagnosis Date  . Abnormal Pap smear   . Anxiety   . Arthritis   . Cataract   . Cholelithiasis 12/23/2017  . Chronic kidney disease (CKD), stage III (moderate) (HCC)   . Cystitis   . Diabetes mellitus without complication ([place])   . Genital warts   . GERD (gastroesophageal reflux disease)   . Heart murmur   . Hematuria   . History of blood transfusion   . History of ectopic pregnancy   . History of gestational diabetes   . History of kidney stones   . History of sepsis   . Hypertension   . Idiopathic peripheral neuropathy   . Leg ulcer, left ([place])   . Low back pain   . Migraines   . Pneumonia   . PONV (postoperative nausea and vomiting)   . Recurrent UTI   . Renal calculi 12/23/2017  . Sigmoid diverticulosis 12/23/2017  . Ulcer of foot ([place])   . Weakness    PREVIOUS CULTURES: October 2019 Component 53mo ago  Specimen Description KIDNEY LEFT NEPHROSTOMY   Special Requests NONE  Performed at [facility], [place] [address] [place], [place] [address]   Culture >=100,000 COLONIES/mL ESCHERICHIA COLIAbnormal    Report Status 12/28/2017 FINAL   Organism ID, Bacteria ESCHERICHIA COLIAbnormal    Resulting Agency [facility]  Susceptibility   Escherichia  coli    MIC    AMPICILLIN >=32 RESIST... Resistant    AMPICILLIN/SULBACTAM 16 INTERMED... Intermediate    CEFAZOLIN <=4 SENSITIVE  Sensitive    CEFEPIME <=1 SENSITIVE  Sensitive    CEFTAZIDIME <=1 SENSITIVE  Sensitive    CEFTRIAXONE <=1 SENSITIVE  Sensitive    CIPROFLOXACIN >=4 RESISTANT  Resistant    Extended ESBL NEGATIVE  Sensitive    GENTAMICIN <=1 SENSITIVE  Sensitive    IMIPENEM <=0.25 SENS... Sensitive    PIP/TAZO <=4 SENSITIVE  Sensitive    TRIMETH/SULFA <=20 SENSIT... Sensitive        Consults:  9/24/>>>>UROLOGY 9/24>>>>> PCCM   Procedures:  9/24>urgent cystoscopy and double-J stent placement  Significant Diagnostic Tests:  12/15/2018 CT RENAL STONE STUDY IMPRESSION: 1. 10 mm obstructive stone within the distal right ureter with secondary moderate left hydroureteronephrosis. 2. Additional bilateral nonobstructive nephrolithiasis as above. 3. Colonic diverticulosis without evidence for acute diverticulitis. 4. Mildly enlarged periaortic and aortocaval adenopathy within the upper abdomen, mildly increased from previous. Finding is indeterminate, but could be reactive in nature. Attention at follow-up. 5. Moderate aorto bi-iliac atherosclerotic disease.  LA 2.2>>3.4 Na 129 K= 5.3 Cl- 92 CO2 22 Glucose 245 BUN 42 Cr 2.45 AG 15 AST 32 ALT 22 Tbili 1.1 GFR 22 Albumin 2.6  WBC 12.5 Hgb 14.4 Plt 221  UA: cloudy w/ large leuk negative nitrite proteinuria 100 many bacteria   and pyuria WBCs 21-50 w/ clumps  EKG Vent. rate 102 BPM PR interval * ms QRS duration 102 ms QT/QTc 334/435 ms P-R-T axes 81 121 -29 Micro Data:  12/15/2018>>>Blood cultures x 2 12/15/2018>>>Urine culture  Antimicrobials:  12/15/2018>>>Rocephin   Interim history/subjective:  Post operatively to ICU. Extubated in PACU Requiring pressors   Seen in follow up progress note by [facility] PCCM at end of night shift. Seen in follow up today.   Objective   Blood pressure 116/83, pulse 86, temperature 98 F (36.7 C), temperature source Oral, resp. rate 20, height 5\' 2"  (1.575 m), weight 101.3 kg, SpO2 95 %.        Intake/Output Summary (Last 24 hours) at 12/15/2018 1313 Last data filed at 12/15/2018 1100 Gross per 24 hour  Intake 6227.22 ml  Output 1300 ml  Net 4927.22 ml   Filed Weights   12/15/18 0011 12/15/18 0500  Weight: 86.2 kg 101.3 kg    Examination: General: Ill appearing adult F, reclined in bed NAD  HENT: NCAT pink mmm trachea midline  Lungs: Symmetrical chest expansion, no accessory use, CTA Cardiovascular: s1s2 no  rgm  Abdomen: soft round, ndnt GU: R CVA tenderness, foley Extremities: No cyanosis, clubbing, edema  Neuro: Drowsy. Following commands Skin: c/d/w/i   Assessment & Plan:  Septic Shock secondary to urinary infection -R distal ureteral calculus with pyelonephrosis s/p R cystoscopy and stent with urology 9/24 Obstructing calculus in ureter  11 mm right distal ureteral stone with subsequent pyelonephrosis Seen by Urology  Started on empiric abx Ceftriaxone Received 30 cc/kg IVF LA 2.2 >>3.4 P Post procedure per Urology Continue on Empiric abx with Ceftriaxone IV ( reviewed previous culture and sensitivities from Oct 2019) Continue IVF LR at 125 cc MAP goal >52mmHg NE for goal Wean pressors as able Trend WBC, fever curve and LA Follow up ECHO  Acute Kidney Injury on CKD stage III Secondary to hypoperfusion in the setting of sepsis  And obstructing ureteral stone w/ subsequent pyelonehprosis -s/p cystoscopy and double J stent with Urology 9/24  P Continue IVF I/Os  Avoid nephrotoxic meds when possible  Trend Renal function and electrolytes   DM On Tresiba at home with Metformin P SSI   H/O Bipolar Type 1 On review of home meds none for this condition Previously on Depakote P Will ask Pharmacy to review  Moderate aorto bi-iliac atherosclerotic disease Seen on CT study P Check Lipid panel    Best practice:  Diet: NPO Pain/Anxiety/Delirium protocol: post-op pain per urology VAP protocol: na DVT prophylaxis: Hep SeaTac and SCDs GI prophylaxis: not indicated Glucose control: SSI Mobility: bedrest Code Status: FULL Family Communication: none at bedside and none listed on Raeford Razor at [phone] pt AAOx3 so did not call this individual and discuss any details  Disposition: ICU, requiring pressors   Labs   CBC: Recent Labs  Lab 12/14/18 2058 12/15/18 0348 12/15/18 0406 12/15/18 0505 12/15/18 0707 12/15/18 0757  WBC 12.5* 8.6  --   --   --  14.1*   NEUTROABS  --   --   --   --   --  12.4*  HGB 14.4 12.5 12.2 11.6* 11.6* 11.8*  HCT 42.2 39.3 36.0 34.0* 34.0* 34.6*  MCV 97.0 100.3*  --   --   --  95.8  PLT 221 187  --   --   --  [id number]    Basic Metabolic Panel: Recent Labs  Lab 12/14/18 2058 12/15/18 0348 12/15/18 0406 12/15/18 0505 12/15/18 0600 12/15/18 0707  NA 129*  --  133* 133* 133* 134*  K 5.3*  --  3.9 4.3 3.8 4.0  CL 92*  --   --   --  99  --   CO2 22  --   --   --  22  --   GLUCOSE 245*  --   --   --  198*  --   BUN 42*  --   --   --  34*  --   CREATININE 2.45* 2.21*  --   --  1.87*  --   CALCIUM 9.5  --   --   --  7.8*  --    GFR: Estimated Creatinine Clearance: 39.2 mL/min (A) (by C-G formula based on SCr of 1.87 mg/dL (H)). Recent Labs  Lab 12/14/18 2058 12/14/18 2339 12/15/18 0226 12/15/18 0348 12/15/18 0757  WBC 12.5*  --   --  8.6 14.1*  LATICACIDVEN  --  2.2* 3.4*  --   --     Liver Function Tests: Recent Labs  Lab 12/14/18 2058  AST 32  ALT 22  ALKPHOS 109  BILITOT 1.1  PROT 7.1  ALBUMIN 2.6*   Recent Labs  Lab 12/14/18 2058  LIPASE 25   No results for input(s): AMMONIA in the last 168 hours.  ABG    Component Value Date/Time   PHART 7.410 12/15/2018 0707   PCO2ART 37.6 12/15/2018 0707   PO2ART 73.0 (L) 12/15/2018 0707   HCO3 23.6 12/15/2018 0707   TCO2 25 12/15/2018 0707   ACIDBASEDEF 1.0 12/15/2018 0505   O2SAT 94.0 12/15/2018 0707     Coagulation Profile: Recent Labs  Lab 12/15/18 0348  INR 1.1    Cardiac Enzymes: No results for input(s): CKTOTAL, CKMB, CKMBINDEX, TROPONINI in the last 168 hours.  HbA1C: Hgb A1c MFr Bld  Date/Time Value Ref Range Status  12/15/2018 03:49 AM 8.6 (H) 4.8 - 5.6 % Final    Comment:    (NOTE) Pre diabetes:          5.7%-6.4% Diabetes:              >  6.4% Glycemic control for   <7.0% adults with diabetes   12/28/2017 05:33 AM 11.3 (H) 4.8 - 5.6 % Final    Comment:    (NOTE) Pre diabetes:          5.7%-6.4% Diabetes:               >6.4% Glycemic control for   <7.0% adults with diabetes     CBG: Recent Labs  Lab 12/15/18 0358 12/15/18 0535 12/15/18 0742 12/15/18 1120  GLUCAP 203* 194* [place] MSN, AGACNP-BC [place] [id number] If no answer, [id number] 12/15/2018, 1:14 PM

## 2018-12-15 NOTE — Progress Notes (Signed)
..   NAME:  Pamela Carney, MRN:  [id number], DOB:  02-16-1966, LOS: 0 ADMISSION DATE:  12/14/2018, CONSULTATION DATE:  12/15/2018 REFERRING MD:  Christy Gentles MD, CHIEF COMPLAINT:  Abdominal pain and vomiting   Brief History   53 yr old F w/ PMHx sig for presents to [facility] w/ abdominal pain and emesis found to have a 11 mm obtsructng stone. S/p 3.5L IVF La 3.5 and SBP in 80 s PCCM asked to admit.  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 Per EDP documentation the patient stated that these symptoms began three days ago and have become progressively worse. She also reports subjective fevers, and a non productive cough.   Seen briefly prior to patient being transferred to OR On IVF toxic appearing c/o abdominal pain. Her symptoms have been going for a month and became progressively worse in the last few days.  Past Medical History  .Marland Kitchen Active Ambulatory Problems    Diagnosis Date Noted  . Pain 12/27/2012  . Family planning, IUD (intrauterine device) check/reinsertion/removal 12/28/2012  . Perimenopausal 12/29/2012  . Neuropathy involving both lower extremities 01/31/2014  . Chronic interstitial nephritis 12/24/2017  . Generalized weakness 12/24/2017  . Sepsis ([place]) 12/24/2017  . Hypokalemia 12/24/2017  . Oral candidiasis 12/24/2017  . Acute cystitis with hematuria   . CKD (chronic kidney disease), stage III ([place]) 12/28/2017  . 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 New onset type 2 diabetes mellitus ([place]) 02/01/2018  . Bipolar 1 disorder ([place]) 02/01/2018  . IUD (intrauterine device) in place 02/01/2018  . Obesity 02/01/2018  . Healthcare maintenance 02/01/2018  . Ureteral stone with hydronephrosis   . AKI (acute kidney injury) [facility])    Resolved Ambulatory  Problems    Diagnosis Date Noted  . Acute lower UTI 12/24/2017  . Metabolic acidosis 0000000  . Acute kidney failure ([place]) 12/24/2017   Past Medical History:  Diagnosis Date  . Abnormal Pap smear   . Anxiety   . Arthritis   . Cataract   . Cholelithiasis 12/23/2017  . Chronic kidney disease (CKD), stage III (moderate) (HCC)   . Cystitis   . Diabetes mellitus without complication ([place])   . Genital warts   . GERD (gastroesophageal reflux disease)   . Heart murmur   . Hematuria   . History of blood transfusion   . History of ectopic pregnancy   . History of gestational diabetes   . History of kidney stones   . History of sepsis   . Hypertension   . Idiopathic peripheral neuropathy   . Leg ulcer, left ([place])   . Low back pain   . Migraines   . Pneumonia   . PONV (postoperative nausea and vomiting)   . Recurrent UTI   . Renal calculi 12/23/2017  . Sigmoid diverticulosis 12/23/2017  . Ulcer of foot ([place])   . Weakness    PREVIOUS CULTURES: October 2019 Component 64mo ago  Specimen Description KIDNEY LEFT NEPHROSTOMY   Special Requests NONE  Performed at [facility], [place] [address], [place], [place] [address]   Culture >=100,000 COLONIES/mL ESCHERICHIA COLIAbnormal    Report Status 12/28/2017 FINAL   Organism ID, Bacteria ESCHERICHIA COLIAbnormal    Resulting Agency [facility]  Susceptibility   Escherichia  coli    MIC    AMPICILLIN >=32 RESIST... Resistant    AMPICILLIN/SULBACTAM 16 INTERMED... Intermediate    CEFAZOLIN <=4 SENSITIVE  Sensitive    CEFEPIME <=1 SENSITIVE  Sensitive    CEFTAZIDIME <=1 SENSITIVE  Sensitive    CEFTRIAXONE <=1 SENSITIVE  Sensitive    CIPROFLOXACIN >=4 RESISTANT  Resistant    Extended ESBL NEGATIVE  Sensitive    GENTAMICIN <=1 SENSITIVE  Sensitive    IMIPENEM <=0.25 SENS... Sensitive    PIP/TAZO <=4 SENSITIVE  Sensitive    TRIMETH/SULFA <=20 SENSIT... Sensitive        Consults:  9/24/>>>>UROLOGY 9/24>>>>> PCCM   Procedures:  9/24>urgent cystoscopy and double-J stent placement  Significant Diagnostic Tests:  12/15/2018 CT RENAL STONE STUDY IMPRESSION: 1. 10 mm obstructive stone within the distal right ureter with secondary moderate left hydroureteronephrosis. 2. Additional bilateral nonobstructive nephrolithiasis as above. 3. Colonic diverticulosis without evidence for acute diverticulitis. 4. Mildly enlarged periaortic and aortocaval adenopathy within the upper abdomen, mildly increased from previous. Finding is indeterminate, but could be reactive in nature. Attention at follow-up. 5. Moderate aorto bi-iliac atherosclerotic disease.  LA 2.2>>3.4 Na 129 K= 5.3 Cl- 92 CO2 22 Glucose 245 BUN 42 Cr 2.45 AG 15 AST 32 ALT 22 Tbili 1.1 GFR 22 Albumin 2.6  WBC 12.5 Hgb 14.4 Plt 221  UA: cloudy w/ large leuk negative nitrite proteinuria 100 many bacteria   and pyuria WBCs 21-50 w/ clumps  EKG Vent. rate 102 BPM PR interval * ms QRS duration 102 ms QT/QTc 334/435 ms P-R-T axes 81 121 -29 Micro Data:  12/15/2018>>>Blood cultures x 2 12/15/2018>>>Urine culture  Antimicrobials:  12/15/2018>>>Rocephin   Interim history/subjective:  Post operatively to ICU. Extubated in PACU Requiring pressors   Seen in follow up progress note by [facility] PCCM at end of night shift. Seen in follow up today.   Objective   Blood pressure 116/83, pulse 78, temperature 98 F (36.7 C), temperature source Oral, resp. rate 17, height 5\' 2"  (1.575 m), weight 101.3 kg, SpO2 96 %.        Intake/Output Summary (Last 24 hours) at 12/15/2018 1414 Last data filed at 12/15/2018 1100 Gross per 24 hour  Intake 6227.22 ml  Output 1300 ml  Net 4927.22 ml   Filed Weights   12/15/18 0011 12/15/18 0500  Weight: 86.2 kg 101.3 kg    Examination: General: Ill appearing adult F, reclined in bed NAD  HENT: NCAT pink mmm trachea midline  Lungs: Symmetrical chest expansion, no accessory use, CTA Cardiovascular: s1s2 no  rgm  Abdomen: soft round, ndnt GU: R CVA tenderness, foley Extremities: No cyanosis, clubbing, edema  Neuro: Drowsy. Following commands Skin: c/d/w/i   Assessment & Plan:  Septic Shock secondary to urinary infection -R distal ureteral calculus with pyelonephrosis s/p R cystoscopy and stent with urology 9/24 Obstructing calculus in ureter  11 mm right distal ureteral stone with subsequent pyelonephrosis Seen by Urology  Started on empiric abx Ceftriaxone Received 30 cc/kg IVF LA 2.2 >>3.4 P Post procedure per Urology Continue on Empiric abx with Ceftriaxone IV ( reviewed previous culture and sensitivities from Oct 2019) Continue IVF LR at 125 cc MAP goal >56mmHg NE for goal Wean pressors as able Trend WBC, fever curve and LA Follow up ECHO  Acute Kidney Injury on CKD stage III Secondary to hypoperfusion in the setting of sepsis  And obstructing ureteral stone w/ subsequent pyelonehprosis -s/p cystoscopy and double J stent with Urology 9/24  P Continue IVF I/Os  Avoid nephrotoxic meds when possible  Trend Renal function and electrolytes   DM On Tresiba at home with Metformin P SSI   H/O Bipolar Type 1 On review of home meds none for this condition Previously on Depakote P Will ask Pharmacy to review  Moderate aorto bi-iliac atherosclerotic disease Seen on CT study P Check Lipid panel    Best practice:  Diet: NPO Pain/Anxiety/Delirium protocol: post-op pain per urology VAP protocol: na DVT prophylaxis: Hep  and SCDs GI prophylaxis: not indicated Glucose control: SSI Mobility: bedrest Code Status: FULL Family Communication: none at bedside and none listed on Raeford Razor at [phone] pt AAOx3 so did not call this individual and discuss any details  Disposition: ICU, requiring pressors   Labs   CBC: Recent Labs  Lab 12/14/18 2058 12/15/18 0348 12/15/18 0406 12/15/18 0505 12/15/18 0707 12/15/18 0757  WBC 12.5* 8.6  --   --   --  14.1*   NEUTROABS  --   --   --   --   --  12.4*  HGB 14.4 12.5 12.2 11.6* 11.6* 11.8*  HCT 42.2 39.3 36.0 34.0* 34.0* 34.6*  MCV 97.0 100.3*  --   --   --  95.8  PLT 221 187  --   --   --  [id number]    Basic Metabolic Panel: Recent Labs  Lab 12/14/18 2058 12/15/18 0348 12/15/18 0406 12/15/18 0505 12/15/18 0600 12/15/18 0707  NA 129*  --  133* 133* 133* 134*  K 5.3*  --  3.9 4.3 3.8 4.0  CL 92*  --   --   --  99  --   CO2 22  --   --   --  22  --   GLUCOSE 245*  --   --   --  198*  --   BUN 42*  --   --   --  34*  --   CREATININE 2.45* 2.21*  --   --  1.87*  --   CALCIUM 9.5  --   --   --  7.8*  --    GFR: Estimated Creatinine Clearance: 39.2 mL/min (A) (by C-G formula based on SCr of 1.87 mg/dL (H)). Recent Labs  Lab 12/14/18 2058 12/14/18 2339 12/15/18 0226 12/15/18 0348 12/15/18 0757  WBC 12.5*  --   --  8.6 14.1*  LATICACIDVEN  --  2.2* 3.4*  --   --     Liver Function Tests: Recent Labs  Lab 12/14/18 2058  AST 32  ALT 22  ALKPHOS 109  BILITOT 1.1  PROT 7.1  ALBUMIN 2.6*   Recent Labs  Lab 12/14/18 2058  LIPASE 25   No results for input(s): AMMONIA in the last 168 hours.  ABG    Component Value Date/Time   PHART 7.410 12/15/2018 0707   PCO2ART 37.6 12/15/2018 0707   PO2ART 73.0 (L) 12/15/2018 0707   HCO3 23.6 12/15/2018 0707   TCO2 25 12/15/2018 0707   ACIDBASEDEF 1.0 12/15/2018 0505   O2SAT 94.0 12/15/2018 0707     Coagulation Profile: Recent Labs  Lab 12/15/18 0348  INR 1.1    Cardiac Enzymes: No results for input(s): CKTOTAL, CKMB, CKMBINDEX, TROPONINI in the last 168 hours.  HbA1C: Hgb A1c MFr Bld  Date/Time Value Ref Range Status  12/15/2018 03:49 AM 8.6 (H) 4.8 - 5.6 % Final    Comment:    (NOTE) Pre diabetes:          5.7%-6.4% Diabetes:              >  6.4% Glycemic control for   <7.0% adults with diabetes   12/28/2017 05:33 AM 11.3 (H) 4.8 - 5.6 % Final    Comment:    (NOTE) Pre diabetes:          5.7%-6.4% Diabetes:               >6.4% Glycemic control for   <7.0% adults with diabetes     CBG: Recent Labs  Lab 12/15/18 0358 12/15/18 0535 12/15/18 0742 12/15/18 1120  GLUCAP 203* 194* [place] MSN, AGACNP-BC [place] [id number] If no answer, [id number] 12/15/2018, 2:14 PM

## 2018-12-16 ENCOUNTER — Encounter (HOSPITAL_COMMUNITY): Payer: Self-pay

## 2018-12-16 DIAGNOSIS — A4151 Sepsis due to Escherichia coli [E. coli]: Principal | ICD-10-CM

## 2018-12-16 LAB — CBC
HCT: 32.1 % — ABNORMAL LOW (ref 36.0–46.0)
Hemoglobin: 10.2 g/dL — ABNORMAL LOW (ref 12.0–15.0)
MCH: 32 pg (ref 26.0–34.0)
MCHC: 31.8 g/dL (ref 30.0–36.0)
MCV: 100.6 fL — ABNORMAL HIGH (ref 80.0–100.0)
Platelets: 175 10*3/uL (ref 150–400)
RBC: 3.19 MIL/uL — ABNORMAL LOW (ref 3.87–5.11)
RDW: 15.2 % (ref 11.5–15.5)
WBC: 9.3 10*3/uL (ref 4.0–10.5)
nRBC: 0 % (ref 0.0–0.2)

## 2018-12-16 LAB — BASIC METABOLIC PANEL
Anion gap: 10 (ref 5–15)
BUN: 36 mg/dL — ABNORMAL HIGH (ref 6–20)
CO2: 25 mmol/L (ref 22–32)
Calcium: 8.9 mg/dL (ref 8.9–10.3)
Chloride: 102 mmol/L (ref 98–111)
Creatinine, Ser: 1.51 mg/dL — ABNORMAL HIGH (ref 0.44–1.00)
GFR calc Af Amer: 46 mL/min — ABNORMAL LOW (ref >60)
GFR calc non Af Amer: 39 mL/min — ABNORMAL LOW (ref >60)
Glucose, Bld: 181 mg/dL — ABNORMAL HIGH (ref 70–99)
Potassium: 4.1 mmol/L (ref 3.5–5.1)
Sodium: 137 mmol/L (ref 135–145)

## 2018-12-16 LAB — TSH: TSH: 0.465 u[IU]/mL (ref 0.350–4.500)

## 2018-12-16 LAB — GLUCOSE, CAPILLARY
Glucose-Capillary: 156 mg/dL — ABNORMAL HIGH (ref 70–99)
Glucose-Capillary: 190 mg/dL — ABNORMAL HIGH (ref 70–99)
Glucose-Capillary: 192 mg/dL — ABNORMAL HIGH (ref 70–99)
Glucose-Capillary: 223 mg/dL — ABNORMAL HIGH (ref 70–99)
Glucose-Capillary: 268 mg/dL — ABNORMAL HIGH (ref 70–99)
Glucose-Capillary: 270 mg/dL — ABNORMAL HIGH (ref 70–99)

## 2018-12-16 LAB — VITAMIN B12: Vitamin B-12: 454 pg/mL (ref 180–914)

## 2018-12-16 LAB — T4, FREE: Free T4: 0.88 ng/dL (ref 0.61–1.12)

## 2018-12-16 MED ORDER — MORPHINE SULFATE (PF) 2 MG/ML IV SOLN: 1.0000 mg | Freq: Four times a day (QID) | INTRAVENOUS | Status: DC | PRN

## 2018-12-16 MED ORDER — PNEUMOCOCCAL VAC POLYVALENT 25 MCG/0.5ML IJ INJ
0.5000 mL | INJECTION | INTRAMUSCULAR | Status: AC
  Administered 2018-12-17: 09:00:00 0.5 mL via INTRAMUSCULAR
  Filled 2018-12-16: qty 0.5

## 2018-12-16 MED ORDER — MORPHINE SULFATE (PF) 2 MG/ML IV SOLN
2.0000 mg | Freq: Four times a day (QID) | INTRAVENOUS | Status: DC | PRN
  Administered 2018-12-16 – 2018-12-19 (×9): 2 mg via INTRAVENOUS
  Filled 2018-12-16 (×10): qty 1

## 2018-12-16 NOTE — TOC Initial Note (Addendum)
Transition of Care [facility]) - Initial/Assessment Note    Patient Details  Name: Pamela Carney MRN: [id number] Date of Birth: 10-19-1965  Transition of Care [facility]) CM/SW Contact:    Midge Minium RN, BSN, NCM-BC, ACM-RN [phone] Phone Number: 12/16/2018, 12:39 PM  Clinical Narrative:                 CM consult for medication assistance acknowledged. CM met with the patient to discuss CM role and POC. Patient is independent with her ADLs and lives at home with her 52 yo son. Patient states not having a PCP; still awaiting her Medicaid card with her assigned PCP provider. Insurance confirmed as: [employer] and Medicaid [employer] (prescriptions $3-$4). Patient has dual insurance to assist with her Rx needs. Patient is agreeable to utilizing [facility] for a f/u appointment and for Rx needs post-discharge. CM will arrange and document on patient AVS. CM team will continue to follow.   Expected Discharge Plan: Home/Self Care Barriers to Discharge: Continued Medical Work up   Patient Goals and CMS Choice Patient states their goals for this hospitalization and ongoing recovery are:: "to get better"   Choice offered to / list presented to : NA  Expected Discharge Plan and Services Expected Discharge Plan: Home/Self Care In-house Referral: PCP / [facility] Discharge Planning Services: CM Consult, Follow-up appt scheduled, [place] Acute [facility] Choice: NA Living arrangements for the past 2 months: Single Family Home                 DME Arranged: N/A DME Agency: NA       HH Arranged: NA HH Agency: NA        Prior Living Arrangements/Services Living arrangements for the past 2 months: Single Family Home Lives with:: Self, Minor Children Patient language and need for interpreter reviewed:: Yes Do you feel safe going back to the place where you live?: Yes      Need for Family Participation in Patient Care: No (Comment) Care giver support system in place?: Yes  (comment)   Criminal Activity/Legal Involvement Pertinent to Current Situation/Hospitalization: No - Comment as needed  Activities of Daily Living Home Assistive Devices/Equipment: Walker (specify type), CBG Meter(front wheel walker: ) ADL Screening (condition at time of admission) Patient's cognitive ability adequate to safely complete daily activities?: Yes Is the patient deaf or have difficulty hearing?: No Does the patient have difficulty seeing, even when wearing glasses/contacts?: No Does the patient have difficulty concentrating, remembering, or making decisions?: No Patient able to express need for assistance with ADLs?: Yes Does the patient have difficulty dressing or bathing?: No Independently performs ADLs?: Yes (appropriate for developmental age) Does the patient have difficulty walking or climbing stairs?: No Weakness of Legs: None Weakness of Arms/Hands: None  Permission Sought/Granted Permission sought to share information with : Case Manager, PCP Permission granted to share information with : Yes, Verbal Permission Granted     Permission granted to share info w AGENCY: Primary Care facilities        Emotional Assessment Appearance:: Appears stated age Attitude/Demeanor/Rapport: Gracious Affect (typically observed): Accepting, Calm, Pleasant Orientation: : Oriented to Self, Oriented to Place, Oriented to  Time, Oriented to Situation Alcohol / Substance Use: Not Applicable Psych Involvement: No (comment)  Admission diagnosis:  Dehydration [E86.0] AKI (acute kidney injury) ([place]) [N17.9] Ureteral stone with hydronephrosis [N13.2] Septic shock ([place]) [A41.9, R65.21] Patient Active Problem List   Diagnosis Date Noted  . Septic shock ([place]) 12/15/2018  . Ureteral  stone with hydronephrosis   . AKI (acute kidney injury) ([place])   . New onset type 2 diabetes mellitus ([place]) 02/01/2018  . Bipolar 1 disorder ([place]) 02/01/2018  . IUD (intrauterine device) in place 02/01/2018   . Obesity 02/01/2018  . Healthcare maintenance 02/01/2018  . CKD (chronic kidney disease), stage III ([place]) 12/28/2017  . 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 Acute cystitis with hematuria   . Chronic interstitial nephritis 12/24/2017  . Generalized weakness 12/24/2017  . Sepsis ([place]) 12/24/2017  . Hypokalemia 12/24/2017  . Oral candidiasis 12/24/2017  . Neuropathy involving both lower extremities 01/31/2014  . Perimenopausal 12/29/2012  . Family planning, IUD (intrauterine device) check/reinsertion/removal 12/28/2012  . Pain 12/27/2012   PCP:  Patient, No Pcp Per Pharmacy:   [facility], [address] [place] [address] Phone: [phone] Fax: [phone]     Social Determinants of Health (SDOH) Interventions    Readmission Risk Interventions No flowsheet data found.

## 2018-12-16 NOTE — Progress Notes (Addendum)
..   NAME:  Pamela Carney, MRN:  [id number], DOB:  1966/01/30, LOS: 1 ADMISSION DATE:  12/14/2018, CONSULTATION DATE:  12/14/2018 REFERRING MD:  Christy Gentles MD, CHIEF COMPLAINT:  Abdominal pain and vomiting   Brief History   53 yr old F w/ PMHx sig for nephrolithiasis and Bipolar disorder presented to [facility] w/ abdominal pain and emesis found to have a 11 mm obtsructng stone.Septic shock. POD 1 Cystoscopy, right retrograde ureteropyelogram, fluoroscopic interpretation, right double-J stent placement. Blood cultures positive for E.coli  History of present illness   53 yr old obese F w/ PMHx of Bipolar Type 1, DM, CKD stage III, HTN, smoker (37 pack years),  Nephrolithiasis including staghorn calculus that required nephrostomy tubes, presented to [facility] w/ complaints of abdominal pain accompanied with nausea and vomiting. Per EDP documentation the patient stated that these symptoms began three days ago and have become progressively worse. She also reported subjective fevers, and a non productive cough.   POD 1 Cystoscopy, right retrograde ureteropyelogram, fluoroscopic interpretation, right double-J stent placement. Seen Overnight laying in bed. Ambulates to rest room. Only required severe pain meds once overnight post void. Tolerated diet. Off of Levophed gtt for several hours and remains hemodynamically stable.   Past Medical History  .Marland Kitchen Active Ambulatory Problems    Diagnosis Date Noted  . Pain 12/27/2012  . Family planning, IUD (intrauterine device) check/reinsertion/removal 12/28/2012  . Perimenopausal 12/29/2012  . Neuropathy involving both lower extremities 01/31/2014  . Chronic interstitial nephritis 12/24/2017  . Generalized weakness 12/24/2017  . Sepsis ([place]) 12/24/2017  . Hypokalemia 12/24/2017  . Oral candidiasis 12/24/2017  . Acute cystitis with hematuria   . CKD (chronic kidney disease), stage III ([place]) 12/28/2017  . 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 New onset type 2 diabetes  mellitus ([place]) 02/01/2018  . Bipolar 1 disorder ([place]) 02/01/2018  . IUD (intrauterine device) in place 02/01/2018  . Obesity 02/01/2018  . Healthcare maintenance 02/01/2018  . Ureteral stone with hydronephrosis   . AKI (acute kidney injury) [facility])    Resolved Ambulatory Problems    Diagnosis Date Noted  . Acute lower UTI 12/24/2017  . Metabolic acidosis 68/34/1962  . Acute kidney failure ([place]) 12/24/2017   Past Medical History:  Diagnosis Date  . Abnormal Pap smear   . Anxiety   . Arthritis   . Cataract   . Cholelithiasis 12/23/2017  . Chronic kidney disease (CKD), stage III (moderate) (H[facility])   . Cystitis   . Diabetes mellitus without complication ([place])   . Genital warts   . GERD (gastroesophageal reflux disease)   . Heart murmur   . Hematuria   . History of blood transfusion   . History of ectopic pregnancy   . History of gestational diabetes   . History of kidney stones   . History of sepsis   . Hypertension   . Idiopathic peripheral neuropathy   . Leg ulcer, left ([place])   . Low back pain   . Migraines   . Pneumonia   . PONV (postoperative nausea and vomiting)   . Recurrent UTI   . Renal calculi 12/23/2017  . Sigmoid diverticulosis 12/23/2017  . Ulcer of foot ([place])   . Weakness      Significant Hospital Events   12/15/2018 @0330  OR>>urgent cystoscopy and double-J stent placement 12/15/2018 @0715  Post Op on increasing Neo gtt >>switched to Levophed and started on IVF. 12/15/2018 @ 2000>>Off Levophed. Started on a diet.   Consults:  12/14/2018>>urology  Procedures:  1. Cystoscopy,  right retrograde ureteropyelogram, fluoroscopic interpretation, right double-J stent placement. Blood cultures positive for E.coli 2. Endotracheally intubated by Anesthesia in OR on 9/24 3. RIJ double lumen CVC placed by Anesthesia under US guidance   Significant Diagnostic Tests:  12/15/2018 CT RENAL STONE STUDY IMPRESSION: 1. 10 mm obstructive stone within the distal right ureter  with secondary moderate left hydroureteronephrosis. 2. Additional bilateral nonobstructive nephrolithiasis as above. 3. Colonic diverticulosis without evidence for acute diverticulitis. 4. Mildly enlarged periaortic and aortocaval adenopathy within the upper abdomen, mildly increased from previous. Finding is indeterminate, but could be reactive in nature. Attention at follow-up. 5. Moderate aorto bi-iliac atherosclerotic disease.  AM labs: WBC 9.3 from 14.1 Hgb 10.2 from 11.8 Plts175 from  207 MCV 100.6 Phos 3.7 Mag 2.1 BG 112m/dl on POC  ABG 7.41/37/73/23 BMET Na 137/ K+ 4.1/ Cl 102/ CO2 25/ BUN 36 Cr 1.51 GFR 39 Micro Data:  MRSA PCR negative 12/15/2018>>Blood culture  Enterobacteriaceae species  DETECTEDAbnormal    Comment: Enterobacteriaceae represent a large family of gram-negative bacteria, not a single organism.  CRITICAL RESULT      Escherichia coli  DETECTEDAbnormal    Comment: CRITICAL RESULT    12/15/2018>>  SARS Coronavirus 2 NEGATIVE    Antimicrobials:  12/15/2018>>Ceftriaxone   Interim history/subjective:  Pt is progressing well. No complaints except for pain post voiding. Tolerated diet.   Objective   Blood pressure 104/63, pulse 61, temperature 98.6 F (37 C), temperature source Oral, resp. rate 20, height 5' 2"  (1.575 m), weight 101.3 kg, SpO2 97 %.        Intake/Output Summary (Last 24 hours) at 12/16/2018 0610 Last data filed at 12/16/2018 0500 Gross per 24 hour  Intake 3684.48 ml  Output 1975 ml  Net 1709.48 ml   Filed Weights   12/15/18 0011 12/15/18 0500  Weight: 86.2 kg 101.3 kg    Examination: General: alert awake female in NAD HENT: normocephalic atraumatic Lungs: decreased at bases no wheezing and no rhonci Cardiovascular: S1 and S2 decreased rate reg rhtyhm Abdomen: non distended obese abdomen tender over the right flank Extremities: no lower ext edema Neuro: AAOx3 no focal deficits Skin: dry and intact no rashes  Resolved  Hospital Problem list   Septic Shock- resolved off of Levophed gtt  Assessment & Plan:  Sepsis secondary to urinary infection -R distal ureteral calculus with pyelonephrosis s/p R cystoscopy and stent with urology 9/24 GNR on blood culture>>Ecoli >>awaiting sensitivities Off of Vasopressors P Post procedure per Urology Continue on Ceftriaxone Continue on IVF LR at 75 cc Started Diet Trend WBC, fever curve  Acute Kidney Injury on CKD stage III Secondary to hypoperfusion in the setting of sepsis  And obstructing ureteral stone w/ subsequent pyelonehprosis -s/p cystoscopy and double J stent with Urology 9/24  Creatinine improved 1.8>>1.5 P Continue IVF I/Os Avoid nephrotoxic meds when possible  Trend Renal function and electrolytes   DM On Tresiba at home with Metformin Started on Diabetic diet P Hold metformin  Lantus 15 u QHS + ISS  Goal BG 140-180 mg/dl   H/O Bipolar Type 1 Spoke to Pharmacy and pt not currently on any meds per their review P Nothing to do  Moderate aorto bi-iliac atherosclerotic disease Seen on CT study Lipid panel: LDL 99 Chol 183 TG 288 HDL 26 P Given that pt is >40 yrs of age has a h/o DM Started Atorvastatin 40 mg to be continued on discharge  Macrocytic Anemia MCV 100 RDW 15.2 P Check TSH, Free T4  B12 level and Folate If Hgb <7 transfuse 1 u PRBC  Best practice:  Diet: Diabetic Diet Pain/Anxiety/Delirium protocol (if indicated): Morphine 8m Q 6hrs for moderate pain and Morphine 271mQ 6hrs for severe pain VAP protocol (if indicated): not indicated DVT prophylaxis: Hep [place] and SCDs GI prophylaxis: not indicated Glucose control: Lantus 15 u QHS and ISS Mobility: ambulate to bathroom, bedrest Code Status: FULL Family Communication: JoCarmelina Pealt [phone] Daytime PCCM to sign patient out   Labs   CBC: Recent Labs  Lab 12/14/18 2058 12/15/18 0348 12/15/18 0406 12/15/18  0505 12/15/18 0707 12/15/18 0757 12/16/18 0541  WBC 12.5* 8.6  --   --   --  14.1* 9.3  NEUTROABS  --   --   --   --   --  12.4*  --   HGB 14.4 12.5 12.2 11.6* 11.6* 11.8* 10.2*  HCT 42.2 39.3 36.0 34.0* 34.0* 34.6* 32.1*  MCV 97.0 100.3*  --   --   --  95.8 100.6*  PLT 221 187  --   --   --  207 17621  Basic Metabolic Panel: Recent Labs  Lab 12/14/18 2058 12/15/18 0348 12/15/18 0406 12/15/18 0505 12/15/18 0600 12/15/18 0707 12/15/18 2154  NA 129*  --  133* 133* 133* 134*  --   K 5.3*  --  3.9 4.3 3.8 4.0  --   CL 92*  --   --   --  99  --   --   CO2 22  --   --   --  22  --   --   GLUCOSE 245*  --   --   --  198*  --   --   BUN 42*  --   --   --  34*  --   --   CREATININE 2.45* 2.21*  --   --  1.87*  --   --   CALCIUM 9.5  --   --   --  7.8*  --   --   MG  --   --   --   --   --   --  2.1  PHOS  --   --   --   --   --   --  3.7   GFR: Estimated Creatinine Clearance: 39.2 mL/min (A) (by C-G formula based on SCr of 1.87 mg/dL (H)). Recent Labs  Lab 12/14/18 2058 12/14/18 2339 12/15/18 0226 12/15/18 0348 12/15/18 0757 12/16/18 0541  WBC 12.5*  --   --  8.6 14.1* 9.3  LATICACIDVEN  --  2.2* 3.4*  --   --   --     Liver Function Tests: Recent Labs  Lab 12/14/18 2058  AST 32  ALT 22  ALKPHOS 109  BILITOT 1.1  PROT 7.1  ALBUMIN 2.6*   Recent Labs  Lab 12/14/18 2058  LIPASE 25   No results for input(s): AMMONIA in the last 168 hours.  ABG    Component Value Date/Time   PHART 7.410 12/15/2018 0707   PCO2ART 37.6 12/15/2018 0707   PO2ART 73.0 (L) 12/15/2018 0707   HCO3 23.6 12/15/2018 0707   TCO2 25 12/15/2018 0707   ACIDBASEDEF 1.0 12/15/2018 0505   O2SAT 94.0 12/15/2018 0707     Coagulation Profile: Recent Labs  Lab 12/15/18 0348  INR 1.1    Cardiac Enzymes: No results for input(s): CKTOTAL, CKMB, CKMBINDEX, TROPONINI in the last  168 hours.  HbA1C: Hgb A1c MFr Bld  Date/Time Value Ref Range Status  12/15/2018 03:49 AM 8.6 (H) 4.8 -  5.6 % Final    Comment:    (NOTE) Pre diabetes:          5.7%-6.4% Diabetes:              >6.4% Glycemic control for   <7.0% adults with diabetes   12/28/2017 05:33 AM 11.3 (H) 4.8 - 5.6 % Final    Comment:    (NOTE) Pre diabetes:          5.7%-6.4% Diabetes:              >6.4% Glycemic control for   <7.0% adults with diabetes     CBG: Recent Labs  Lab 12/15/18 1120 12/15/18 1553 12/15/18 2011 12/16/18 0001 12/16/18 0353  GLUCAP 359* 282* 211* 156* 192*    Review of Systems:   Marland KitchenMarland KitchenReview of Systems  Constitutional: Negative.   HENT: Negative.   Eyes: Negative.   Respiratory: Negative.   Cardiovascular: Negative.   Gastrointestinal: Positive for abdominal pain and nausea.  Genitourinary: Positive for dysuria.  Musculoskeletal: Positive for back pain.  Skin: Negative.   Neurological: Negative.      Past Medical History  She,  has a past medical history of Abnormal Pap smear, AKI (acute kidney injury) ([place]) (12/28/2017), Anxiety, Arthritis, Bipolar 1 disorder ([employer]), Cataract, Cholelithiasis (12/23/2017), Chronic kidney disease (CKD), stage III (moderate) ([place]), Cystitis, Diabetes mellitus without complication ([place]), Genital warts, GERD (gastroesophageal reflux disease), Heart murmur, Hematuria, History of blood transfusion, History of ectopic pregnancy, History of gestational diabetes, History of kidney stones, History of sepsis, Hypertension, Idiopathic peripheral neuropathy, Leg ulcer, left ([place]), Low back pain, Migraines, Obesity, Oral candidiasis, Pneumonia, PONV (postoperative nausea and vomiting), Recurrent UTI, Renal calculi (12/23/2017), Sigmoid diverticulosis (12/23/2017), Ulcer of foot ([place]), and Weakness.   Surgical History    Past Surgical History:  Procedure Laterality Date  . CERVICAL CONE BIOPSY    . CESAREAN SECTION     x3  . CYSTOSCOPY/URETEROSCOPY/HOLMIUM LASER/STENT PLACEMENT Right 02/04/2018   Procedure: CYSTOSCOPY/URETEROSCOPY/RETROGRADE  PYELOGRAM/HOLMIUM LASER/STENT PLACEMENT;  Surgeon: Alexis Frock, MD;  Location: [facility];  Service: Urology;  Laterality: Right;  . CYSTOSCOPY/URETEROSCOPY/HOLMIUM LASER/STENT PLACEMENT Right 02/07/2018   Procedure: CYSTOSCOPY LEFT STENT EXCHANGE;  Surgeon: Alexis Frock, MD;  Location: [facility];  Service: Urology;  Laterality: Right;  . DILATION AND CURETTAGE OF UTERUS    . ECTOPIC PREGNANCY SURGERY    . IR NEPHROSTOMY PLACEMENT LEFT  12/25/2017  . NEPHROLITHOTOMY Left 02/04/2018   Procedure: NEPHROLITHOTOMY PERCUTANEOUS;  Surgeon: Alexis Frock, MD;  Location: [facility];  Service: Urology;  Laterality: Left;  3 HRS  . NEPHROLITHOTOMY Left 02/07/2018   Procedure: SECOND LOOK NEPHROLITHOTOMY PERCUTANEOUS;  Surgeon: Alexis Frock, MD;  Location: [facility];  Service: Urology;  Laterality: Left;  2 HRS  . OVARIAN CYST REMOVAL    . UNILATERAL SALPINGECTOMY     Pt unsure of which fallopian tube was removed  . WISDOM TOOTH EXTRACTION       Social History   reports that she has been smoking cigarettes. She has a 37.00 pack-year smoking history. She has never used smokeless tobacco. She reports that she does not drink alcohol or use drugs.   Family History   Her family history includes Alcohol abuse in her father; COPD in her mother and paternal grandfather; Cancer in her father, paternal grandfather, and paternal grandmother; Depression in her father and mother; Diabetes in  her mother and paternal grandfather; Heart attack in her father, mother, and sister; Heart disease in her mother, paternal grandfather, and paternal grandmother; Heart failure in her mother; Hyperlipidemia in her mother; Hypertension in her father, mother, and sister; Peripheral vascular disease in her mother; Stroke in her maternal grandmother.   Allergies Allergies  Allergen Reactions  . Sulfa Antibiotics Hives and Swelling    Swelling site not recalled  . Latex Rash  . Tape Rash    Not tolerated well     Home Medications   Prior to Admission medications   Medication Sig Start Date End Date Taking? Authorizing Provider  insulin degludec (TRESIBA) 100 UNIT/ML SOPN FlexTouch Pen Inject 0.2 mLs (20 Units total) into the skin daily. 02/02/18  Yes Danford, Berna Spare, NP  levonorgestrel (MIRENA) 20 MCG/24HR IUD 1 each by Intrauterine route once.   Yes [provider]  Blood Glucose Monitoring Suppl (ONETOUCH VERIO) w/Device KIT Use to check blood sugars every morning fasting and 2 hours after largest meal 02/02/18   Danford, Katy D, NP  glucose blood (ONETOUCH VERIO) test strip Use to check blood sugars every morning fasting and 2 hours after largest meal 02/02/18   Danford, Katy D, NP  Insulin Pen Needle (PEN NEEDLES) 31G X 8 MM MISC 1 each by Does not apply route daily. 02/23/18   Danford, Valetta Fuller D, NP  metFORMIN (GLUCOPHAGE) 500 MG tablet Take 1 tablet (500 mg total) by mouth 2 (two) times daily with a meal. PATIENT MUST HAVE OFFICE VISIT PRIOR TO ANY FURTHER REFILLS! 09/05/18   Danford, Berna Spare, NP  Multiple Vitamins-Minerals (ONE-A-DAY WOMENS 50+ ADVANTAGE) TABS Take 1 tablet by mouth daily.    [provider]  ONE TOUCH LANCETS MISC Use to check blood sugars every morning fasting and 2 hours after largest meal 02/02/18   Esaw Grandchild, NP     Critical care time: 45 mins    Signed Dr Seward Carol Pulmonary Critical Care

## 2018-12-16 NOTE — Progress Notes (Signed)
PCCM Brief Progression Note 12/16/2018  Seen by PCCM overnight and this morning by Dr. Gilford Raid. Off NE and transfer orders placed to progressive.  Discussed patient with Dr. Florene Glen, [facility], regarding continuation of care. [facility] will take over care 9/26 with PCCM off.    Eliseo Gum MSN, AGACNP-BC [place] [id number] If no answer, [id number] 12/16/2018, 7:44 AM

## 2018-12-17 LAB — CULTURE, BLOOD (ROUTINE X 2): Special Requests: ADEQUATE

## 2018-12-17 LAB — URINE CULTURE: Culture: 20000 — AB

## 2018-12-17 LAB — GLUCOSE, CAPILLARY
Glucose-Capillary: 108 mg/dL — ABNORMAL HIGH (ref 70–99)
Glucose-Capillary: 128 mg/dL — ABNORMAL HIGH (ref 70–99)
Glucose-Capillary: 159 mg/dL — ABNORMAL HIGH (ref 70–99)
Glucose-Capillary: 177 mg/dL — ABNORMAL HIGH (ref 70–99)
Glucose-Capillary: 223 mg/dL — ABNORMAL HIGH (ref 70–99)
Glucose-Capillary: 245 mg/dL — ABNORMAL HIGH (ref 70–99)

## 2018-12-17 MED ORDER — INSULIN ASPART 100 UNIT/ML ~~LOC~~ SOLN
0.0000 [IU] | Freq: Three times a day (TID) | SUBCUTANEOUS | Status: DC
  Administered 2018-12-17 – 2018-12-18 (×2): 3 [IU] via SUBCUTANEOUS
  Administered 2018-12-18: 1 [IU] via SUBCUTANEOUS
  Administered 2018-12-18 – 2018-12-19 (×3): 2 [IU] via SUBCUTANEOUS
  Administered 2018-12-19: 1 [IU] via SUBCUTANEOUS
  Administered 2018-12-20: 13:00:00 2 [IU] via SUBCUTANEOUS
  Administered 2018-12-20 – 2018-12-21 (×3): 1 [IU] via SUBCUTANEOUS
  Administered 2018-12-21: 2 [IU] via SUBCUTANEOUS
  Administered 2018-12-21: 09:00:00 1 [IU] via SUBCUTANEOUS
  Administered 2018-12-22: 2 [IU] via SUBCUTANEOUS
  Administered 2018-12-22: 09:00:00 1 [IU] via SUBCUTANEOUS
  Administered 2018-12-22: 12:00:00 2 [IU] via SUBCUTANEOUS
  Administered 2018-12-23 – 2018-12-24 (×2): 1 [IU] via SUBCUTANEOUS
  Administered 2018-12-24 – 2018-12-25 (×4): 2 [IU] via SUBCUTANEOUS
  Administered 2018-12-25: 1 [IU] via SUBCUTANEOUS

## 2018-12-17 MED ORDER — INSULIN ASPART 100 UNIT/ML ~~LOC~~ SOLN
0.0000 [IU] | Freq: Every day | SUBCUTANEOUS | Status: DC
  Administered 2018-12-17 – 2018-12-18 (×2): 2 [IU] via SUBCUTANEOUS
  Administered 2018-12-20 – 2018-12-22 (×2): 3 [IU] via SUBCUTANEOUS

## 2018-12-17 NOTE — Anesthesia Postprocedure Evaluation (Signed)
Anesthesia Post Note  Patient: LUJANE COLQUITT  Procedure(s) Performed: Cystoscopy, right retrograde ureteropyelogram, fluoroscopic interpretation, right double-J stent placement (24 cm x 6 [place]) (Right Ureter)     Patient location during evaluation: PACU Anesthesia Type: General Level of consciousness: awake and alert Pain management: pain level controlled Vital Signs Assessment: post-procedure vital signs reviewed and stable Respiratory status: spontaneous breathing, nonlabored ventilation, respiratory function stable and patient connected to nasal cannula oxygen Cardiovascular status: blood pressure returned to baseline and stable Postop Assessment: no apparent nausea or vomiting Anesthetic complications: no    Last Vitals:  Vitals:   12/17/18 0110 12/17/18 0434  BP: 97/69 127/69  Pulse:    Resp:  18  Temp:  36.9 C  SpO2:  90%    Last Pain:  Vitals:   12/17/18 1300  TempSrc:   PainSc: 0-No pain                 Quintavious Rinck

## 2018-12-17 NOTE — Evaluation (Signed)
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 Pt underwent cystoscopy, R retrograde ureterophyelogram, flouroscopic interpretation, and R double -J stent placement.  Clinical Impression  Pt admitted with above. Pt transferring self to/from [facility] to bed due to freq urination. Pt c/o of 10/10 migraine limiting ambulation to door and back due to pain and dizziness. Pt used RW and states her children help her at home. Suspect pt will progress quickly. Acute PT to cont to follow    Follow Up Recommendations No PT follow up;Supervision - Intermittent    Equipment Recommendations  None recommended by PT    Recommendations for Other Services       Precautions / Restrictions Precautions Precautions: Fall Restrictions Weight Bearing Restrictions: No      Mobility  Bed Mobility Overal bed mobility: Modified Independent             General bed mobility comments: pt transferred in/out of bed without difficulty or assist, HOB slighlty elevated  Transfers Overall transfer level: Needs assistance Equipment used: None Transfers: Sit to/from [employer] Transfers Sit to Stand: Supervision Stand pivot transfers: Supervision       General transfer comment: pt has been transferring to/from [facility]  every 1.5-2 hours due to frequent urination  Ambulation/Gait Ambulation/Gait assistance: Min guard Gait Distance (Feet): 20 Feet Assistive device: Rolling walker (2 wheeled) Gait Pattern/deviations: Step-through pattern;Decreased stride length;Trunk flexed Gait velocity: slow Gait velocity interpretation: <1.31 ft/sec,  indicative of household ambulator General Gait Details: limited to door and back to bed due to 10/10 migraine, pt reports dizziness and requested use of RW  Stairs            Wheelchair Mobility    Modified Rankin (Stroke Patients Only)       Balance Overall balance assessment: Needs assistance Sitting-balance support: No upper extremity supported;Feet supported Sitting balance-Leahy Scale: Good     Standing balance support: Bilateral upper extremity supported Standing balance-Leahy Scale: Fair Standing balance comment: uses RW for safe ambulation                             Pertinent Vitals/Pain Pain Location: migraine Pain Descriptors / Indicators: Headache    Home Living Family/patient expects to be discharged to:: Private residence Living Arrangements: Children Available Help at Discharge: Family;Available 24 hours/day Type of Home: House Home Access: Stairs to enter Entrance Stairs-Rails: Left Entrance Stairs-Number of Steps: 3 Home Layout: Able to live on main level with bedroom/bathroom Home Equipment: Walker - 2 wheels;Shower seat      Prior Function Level of Independence: Needs assistance   Gait / Transfers Assistance Needed: pt reports "my kids help me on the stairs into the house", uses RW  ADL's / Homemaking Assistance Needed: daughter helps her get into tub        Hand Dominance   Dominant Hand: Right    Extremity/Trunk Assessment   Upper Extremity Assessment Upper Extremity Assessment: Overall WFL for tasks assessed    Lower Extremity Assessment Lower Extremity Assessment: Generalized weakness    Cervical / Trunk Assessment Cervical / Trunk Assessment: Normal  Communication   Communication: No difficulties  Cognition Arousal/Alertness: Awake/alert Behavior During Therapy: WFL for tasks assessed/performed Overall Cognitive Status: History of cognitive impairments - at baseline                                  General Comments: pt with h/o bipolar d/c. Pt appears to be self limiting in ability to ambulate and care for herself as she states "Thats what I have kids for, to take care of me"      General Comments      Exercises     Assessment/Plan    PT Assessment Patient needs continued PT services  PT Problem List Decreased strength;Decreased range of motion;Decreased activity tolerance;Decreased balance;Decreased mobility;Decreased coordination;Decreased cognition;Decreased knowledge of use of DME       PT Treatment Interventions DME instruction;Gait training;Stair training;Functional mobility training;Therapeutic activities;Therapeutic exercise;Balance training;Neuromuscular re-education    PT Goals (Current goals can be found in the Care Plan section)  Acute Rehab PT Goals Patient Stated Goal: home PT Goal Formulation: With patient Time For Goal Achievement: 12/31/18 Potential to Achieve Goals: Good    Frequency Min 3X/week   Barriers to discharge        Co-evaluation               AM-PAC PT "6 Clicks" Mobility  Outcome Measure Help needed turning from your back to your side while in a flat bed without using bedrails?: None Help needed moving from lying on your back to sitting on the side of a flat bed without using bedrails?: None Help needed moving to and from a bed to a chair (including a wheelchair)?: None Help needed standing up from a chair using your arms (e.g., wheelchair or bedside chair)?: None Help needed to walk in hospital room?: A Little Help needed climbing 3-5 steps with a railing? : A Little 6 Click Score: 22    End of Session Equipment Utilized During Treatment: Gait belt Activity Tolerance: Patient tolerated treatment well Patient left: in bed;with call bell/phone within reach;with nursing/sitter in room Nurse Communication: Mobility status;Patient requests pain meds(for headache) PT Visit Diagnosis: Unsteadiness on feet (R26.81);Muscle  weakness (generalized) (M62.81);Difficulty in walking, not elsewhere classified (R26.2)    Time: [id number] PT Time Calculation (min) (ACUTE ONLY): 12 min   Charges:   PT Evaluation $PT Eval Low Complexity: 1 Low         Kittie Plater, PT, DPT Acute Rehabilitation Services Pager #: [phone] Office #: [phone]   Berline Lopes 12/17/2018, 4:21 PM

## 2018-12-17 NOTE — Progress Notes (Signed)
Patient ID: Pamela Carney, female   DOB: 1965-10-11, 53 y.o.   MRN: [id number]  2 Days Post-Op Subjective: Pt feeling improved since admission.  Still sore.  Objective: Vital signs in last 24 hours: Temp:  [98.1 F (36.7 C)-98.4 F (36.9 C)] 98.4 F (36.9 C) (09/26 0434) Pulse Rate:  [53-78] 70 (09/25 2058) Resp:  [16-22] 18 (09/26 0434) BP: (93-138)/(56-83) 127/69 (09/26 0434) SpO2:  [90 %-98 %] 90 % (09/26 0434) Weight:  [100.1 kg] 100.1 kg (09/26 0434)  Intake/Output from previous day: 09/25 0701 - 09/26 0700 In: 952 [P.O.:952] Out: 2500 [Urine:2500] Intake/Output this shift: No intake/output data recorded.  Physical Exam:  General: Alert and oriented Abdomen: Mild R CVAT  Lab Results: Recent Labs    12/15/18 0707 12/15/18 0757 12/16/18 0541  HGB 11.6* 11.8* 10.2*  HCT 34.0* 34.6* 32.1*   CBC Latest Ref Rng & Units 12/16/2018 12/15/2018 12/15/2018  WBC 4.0 - 10.5 K/uL 9.3 14.1(H) -  Hemoglobin 12.0 - 15.0 g/dL 10.2(L) 11.8(L) 11.6(L)  Hematocrit 36.0 - 46.0 % 32.1(L) 34.6(L) 34.0(L)  Platelets 150 - 400 K/uL 175 207 -     BMET Recent Labs    12/15/18 0600 12/15/18 0707 12/16/18 0541  NA 133* 134* 137  K 3.8 4.0 4.1  CL 99  --  102  CO2 22  --  25  GLUCOSE 198*  --  181*  BUN 34*  --  36*  CREATININE 1.87*  --  1.51*  CALCIUM 7.8*  --  8.9     Studies/Results: No results found.  Assessment/Plan: Right ureteral stone/sepsis: S/P right ureteral stent.  Continue appropriate culture specific antibiotics.  Will need 10-14 days of treatment and outpatient follow up with Dr. Tresa Moore to discuss definitive treatment of stone after infection cleared.   LOS: 2 days   Dutch Gray 12/17/2018, 11:06 AM

## 2018-12-17 NOTE — Progress Notes (Signed)
PROGRESS NOTE  Pamela Carney [id number] DOB: 14-Oct-1965 DOA: 12/14/2018 PCP: Patient, No Pcp Per  HPI/Recap of past 24 hours: 53 yr old F w/ PMHx sig for nephrolithiasis and Bipolar disorder presented to [facility] w/ abdominal pain and emesis found to have a 11 mm obtsructng stone.Septic shock. POD 1 Cystoscopy, right retrograde ureteropyelogram, fluoroscopic interpretation, right double-J stent placement. 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 Per EDP documentation the patient stated that these symptoms began three days ago and have become progressively worse. She also reported subjective fevers, and a non productive cough.   POD 1 Cystoscopy, right retrograde ureteropyelogram, fluoroscopic interpretation, right double-J stent placement. Seen Overnight laying in bed. Ambulates to rest room. Only required severe pain meds once overnight post void. Tolerated diet. Off of Levophed gtt for several hours and remains hemodynamically stable.   Transferred to [facility] service on 12/17/2018.  12/17/18: Patient was seen and examined at her bedside this morning.  Reports stabbing pain in her urinary tract with no worsening or improving factors.  Assessment/Plan: Active Problems:   Septic shock (HCC)  Sepsis with recent septic shock secondary to complicated Enterococcus faecalis/E. coli UTI in the setting of right distal ureteral calculus with pyelonephrosis and E. coli bacteremia Off pressors Urine culture taken on 12/15/2018 grew 20,000 colonies of Enterococcus faecalis, 2000 colonies of E. coli Resistance to ampicillin and ciprofloxacin intermediate resistance to ampicillin/sulbactam Currently on Rocephin, continue  UTI  with LUTS, POA Reports stabbing pain in her urinary tract If no improvement may start Pyridium  E. coli bacteremia likely from urinary source Blood cultures drawn on 12/15/2018 1 out of 2 bottles grew E. coli Resistant to ampicillin, ampicillin/sulbactam, ciprofloxacin Currently on Rocephin, continue Repeat blood culture x2 peripherally  Right distal ureteral calculus with pyonephrosis status post right cystoscopy and stent with urology on 12/15/2018 Continue to optimize pain control  Improving AKI Baseline creatinine appears to be 0.9 with GFR greater than 60 Presented with creatinine of 2.45 and GFR 22 Creatinine continues to improve on IV fluid hydration Creatinine 1.51 on 12/16/2018 Continue to avoid nephrotoxins Continue IV fluid hydration Continue to monitor urine output Obtain BMP in the morning  Type 2 diabetes with hyperglycemia Blood sugar is better controlled Hemoglobin A1c 8.6 on 12/15/2018 Continue insulin coverage Continue to hold off oral anti-glycemic's  Physical debility PT OT to assist Fall precautions  History of type I bipolar disorder Currently not on any medications.  Pharmacy review  Moderate aorto bi-iliac atherosclerotic disease Seen on CT study Lipid panel: LDL 99 Chol 183 TG 288 HDL 26 P Given that pt is >40 yrs of age has a h/o DM Started Atorvastatin 40 mg to be continued on discharge  Severe morbid obesity BMI 40 Recommend weight loss outpatient with regular physical activity and healthy dieting   Code Status: Full code  Family Communication: None at bedside  Disposition Plan: Patient is currently not appropriate for discharge due to complicated UTI requiring IV antibiotics in the setting of multiple comorbidities.  Patient will require at least another midnight for further evaluation and treatment of present condition.   Consultants:  PCCM  Procedures:     Antimicrobials:  Rocephin  DVT prophylaxis: Subcu heparin 3 times  daily   Objective: Vitals:   12/16/18 1712 12/16/18 2058 12/17/18 0110 12/17/18 0434  BP: 138/83 93/60 97/69  127/69  Pulse: 71 70    Resp: 19 20  18   Temp: 98.4 F (36.9 C) 98.3 F (36.8 C)  98.4 F (36.9 C)  TempSrc: Oral Oral  Oral  SpO2: 96% 96%  90%  Weight:    100.1 kg  Height:        Intake/Output Summary (Last 24 hours) at 12/17/2018 0944 Last data filed at 12/17/2018 0700 Gross per 24 hour  Intake 712 ml  Output 2500 ml  Net -1788 ml   Filed Weights   12/15/18 0500 12/16/18 0500 12/17/18 0434  Weight: 101.3 kg 100.7 kg 100.1 kg    Exam:  . General: 53 y.o. year-old female well developed well nourished in no acute distress.  Alert and oriented x3. . Cardiovascular: Regular rate and rhythm with no rubs or gallops.  No thyromegaly or JVD noted.   Marland Kitchen Respiratory: Clear to auscultation with no wheezes or rales. Good inspiratory effort. . Abdomen: Soft nontender nondistended with normal bowel sounds x4 quadrants. . Musculoskeletal: Trace lower extremity edema. 2/4 pulses in all 4 extremities. Marland Kitchen Psychiatry: Mood is appropriate for condition and setting   Data Reviewed: CBC: Recent Labs  Lab 12/14/18 2058 12/15/18 0348 12/15/18 0406 12/15/18 0505 12/15/18 0707 12/15/18 0757 12/16/18 0541  WBC 12.5* 8.6  --   --   --  14.1* 9.3  NEUTROABS  --   --   --   --   --  12.4*  --   HGB 14.4 12.5 12.2 11.6* 11.6* 11.8* 10.2*  HCT 42.2 39.3 36.0 34.0* 34.0* 34.6* 32.1*  MCV 97.0 100.3*  --   --   --  95.8 100.6*  PLT 221 187  --   --   --  207 0000000   Basic Metabolic Panel: Recent Labs  Lab 12/14/18 2058 12/15/18 0348 12/15/18 0406 12/15/18 0505 12/15/18 0600 12/15/18 0707 12/15/18 2154 12/16/18 0541  NA 129*  --  133* 133* 133* 134*  --  137  K 5.3*  --  3.9 4.3 3.8 4.0  --  4.1  CL 92*  --   --   --  99  --   --  102  CO2 22  --   --   --  22  --   --  25  GLUCOSE 245*  --   --   --  198*  --   --  181*  BUN 42*  --   --   --  34*  --   --  36*   CREATININE 2.45* 2.21*  --   --  1.87*  --   --  1.51*  CALCIUM 9.5  --   --   --  7.8*  --   --  8.9  MG  --   --   --   --   --   --  2.1  --   PHOS  --   --   --   --   --   --  3.7  --    GFR: Estimated Creatinine Clearance: 48.2 mL/min (A) (by C-G formula based on SCr of 1.51 mg/dL (H)). Liver Function Tests: Recent Labs  Lab 12/14/18 2058  AST 32  ALT 22  ALKPHOS 109  BILITOT 1.1  PROT 7.1  ALBUMIN 2.6*   Recent Labs  Lab 12/14/18 2058  LIPASE 25  No results for input(s): AMMONIA in the last 168 hours. Coagulation Profile: Recent Labs  Lab 12/15/18 0348  INR 1.1   Cardiac Enzymes: No results for input(s): CKTOTAL, CKMB, CKMBINDEX, TROPONINI in the last 168 hours. BNP (last 3 results) No results for input(s): PROBNP in the last 8760 hours. HbA1C: Recent Labs    12/15/18 0349  HGBA1C 8.6*   CBG: Recent Labs  Lab 12/16/18 1715 12/16/18 2100 12/17/18 0000 12/17/18 0426 12/17/18 0650  GLUCAP 190* 268* 177* 128* 108*   Lipid Profile: Recent Labs    12/15/18 0353  CHOL 183  HDL 26*  LDLCALC 99  TRIG 288*  CHOLHDL 7.0   Thyroid Function Tests: Recent Labs    12/16/18 0741  TSH 0.465  FREET4 0.88   Anemia Panel: Recent Labs    12/16/18 0741  VITAMINB12 454   Urine analysis:    Component Value Date/Time   COLORURINE YELLOW 12/14/2018 2030   APPEARANCEUR CLOUDY (A) 12/14/2018 2030   LABSPEC 1.016 12/14/2018 2030   PHURINE 6.0 12/14/2018 2030   GLUCOSEU NEGATIVE 12/14/2018 2030   HGBUR MODERATE (A) 12/14/2018 2030   [place] NEGATIVE 12/14/2018 2030   [place] 12/14/2018 2030   PROTEINUR 100 (A) 12/14/2018 2030   UROBILINOGEN 0.2 03/31/2010 0246   NITRITE NEGATIVE 12/14/2018 2030   LEUKOCYTESUR LARGE (A) 12/14/2018 2030   Sepsis Labs: @LABRCNTIP ([id number])  ) Recent Results (from the past 240 hour(s))  Blood Culture (routine x 2)     Status: Abnormal   Collection Time: 12/15/18 12:16 AM    Specimen: BLOOD  Result Value Ref Range Status   Specimen Description BLOOD RIGHT FOREARM  Final   Special Requests   Final    BOTTLES DRAWN AEROBIC AND ANAEROBIC Blood Culture adequate volume   Culture  Setup Time   Final    GRAM NEGATIVE RODS IN BOTH AEROBIC AND ANAEROBIC BOTTLES CRITICAL RESULT CALLED TO, READ BACK BY AND VERIFIED WITHTillman Sers PHARMD [id number] 12/15/18 A BROWNING Performed at [facility], [place] [address] [place], [place] [address]    Culture ESCHERICHIA COLI (A)  Final   Report Status 12/17/2018 FINAL  Final   Organism ID, Bacteria ESCHERICHIA COLI  Final      Susceptibility   Escherichia coli - MIC*    AMPICILLIN >=32 RESISTANT Resistant     CEFAZOLIN 16 SENSITIVE Sensitive     CEFEPIME <=1 SENSITIVE Sensitive     CEFTAZIDIME <=1 SENSITIVE Sensitive     CEFTRIAXONE <=1 SENSITIVE Sensitive     CIPROFLOXACIN >=4 RESISTANT Resistant     GENTAMICIN <=1 SENSITIVE Sensitive     IMIPENEM <=0.25 SENSITIVE Sensitive     TRIMETH/SULFA <=20 SENSITIVE Sensitive     AMPICILLIN/SULBACTAM >=32 RESISTANT Resistant     PIP/TAZO <=4 SENSITIVE Sensitive     Extended ESBL NEGATIVE Sensitive     * ESCHERICHIA COLI  Blood Culture ID Panel (Reflexed)     Status: Abnormal   Collection Time: 12/15/18 12:16 AM  Result Value Ref Range Status   Enterococcus species NOT DETECTED NOT DETECTED Final   Listeria monocytogenes NOT DETECTED NOT DETECTED Final   Staphylococcus species NOT DETECTED NOT DETECTED Final   Staphylococcus aureus (BCID) NOT DETECTED NOT DETECTED Final   Streptococcus species NOT DETECTED NOT DETECTED Final   Streptococcus agalactiae NOT DETECTED NOT DETECTED Final   Streptococcus pneumoniae NOT DETECTED NOT DETECTED Final   Streptococcus pyogenes NOT DETECTED NOT DETECTED Final   Acinetobacter baumannii NOT DETECTED NOT DETECTED  Final   Enterobacteriaceae species DETECTED (A) NOT DETECTED Final    Comment: Enterobacteriaceae represent a large family of  gram-negative bacteria, not a single organism. CRITICAL RESULT CALLED TO, READ BACK BY AND VERIFIED WITH: Tillman Sers PHARMD [id number] 12/15/18 A BROWNING    Enterobacter cloacae complex NOT DETECTED NOT DETECTED Final   Escherichia coli DETECTED (A) NOT DETECTED Final    Comment: CRITICAL RESULT CALLED TO, READ BACK BY AND VERIFIED WITH: Tillman Sers PHARMD [id number] 12/15/18 A BROWNING    Klebsiella oxytoca NOT DETECTED NOT DETECTED Final   Klebsiella pneumoniae NOT DETECTED NOT DETECTED Final   Proteus species NOT DETECTED NOT DETECTED Final   Serratia marcescens NOT DETECTED NOT DETECTED Final   Carbapenem resistance NOT DETECTED NOT DETECTED Final   Haemophilus influenzae NOT DETECTED NOT DETECTED Final   Neisseria meningitidis NOT DETECTED NOT DETECTED Final   Pseudomonas aeruginosa NOT DETECTED NOT DETECTED Final   Candida albicans NOT DETECTED NOT DETECTED Final   Candida glabrata NOT DETECTED NOT DETECTED Final   Candida krusei NOT DETECTED NOT DETECTED Final   Candida parapsilosis NOT DETECTED NOT DETECTED Final   Candida tropicalis NOT DETECTED NOT DETECTED Final    Comment: Performed at [facility], [place] [address] [place], [place] [address]  SARS Coronavirus 2 [facility] order, Performed in [facility] hospital lab) Nasopharyngeal Nasopharyngeal Swab     Status: None   Collection Time: 12/15/18 12:19 AM   Specimen: Nasopharyngeal Swab  Result Value Ref Range Status   SARS Coronavirus 2 NEGATIVE NEGATIVE Final    Comment: (NOTE) If result is NEGATIVE SARS-CoV-2 target nucleic acids are NOT DETECTED. The SARS-CoV-2 RNA is generally detectable in upper and lower  respiratory specimens during the acute phase of infection. The lowest  concentration of SARS-CoV-2 viral copies this assay can detect is 250  copies / mL. A negative result does not preclude SARS-CoV-2 infection  and should not be used as the sole basis for treatment or other  patient management decisions.  A negative result  may occur with  improper specimen collection / handling, submission of specimen other  than nasopharyngeal swab, presence of viral mutation(s) within the  areas targeted by this assay, and inadequate number of viral copies  (<250 copies / mL). A negative result must be combined with clinical  observations, patient history, and epidemiological information. If result is POSITIVE SARS-CoV-2 target nucleic acids are DETECTED. The SARS-CoV-2 RNA is generally detectable in upper and lower  respiratory specimens dur ing the acute phase of infection.  Positive  results are indicative of active infection with SARS-CoV-2.  Clinical  correlation with patient history and other diagnostic information is  necessary to determine patient infection status.  Positive results do  not rule out bacterial infection or co-infection with other viruses. If result is PRESUMPTIVE POSTIVE SARS-CoV-2 nucleic acids MAY BE PRESENT.   A presumptive positive result was obtained on the submitted specimen  and confirmed on repeat testing.  While 2019 novel coronavirus  (SARS-CoV-2) nucleic acids may be present in the submitted sample  additional confirmatory testing may be necessary for epidemiological  and / or clinical management purposes  to differentiate between  SARS-CoV-2 and other Sarbecovirus currently known to infect humans.  If clinically indicated additional testing with an alternate test  methodology [phone]) is advised. The SARS-CoV-2 RNA is generally  detectable in upper and lower respiratory sp ecimens during the acute  phase of infection. The expected result is Negative. Fact Sheet for  Patients:  StrictlyIdeas.no Fact Sheet for Healthcare Providers: BankingDealers.co.za This test is not yet approved or cleared by the [place] FDA and has been authorized for detection and/or diagnosis of SARS-CoV-2 by FDA under an Emergency Use Authorization (EUA).   This EUA will remain in effect (meaning this test can be used) for the duration of the COVID-19 declaration under Section 564(b)(1) of the Act, 21 U.S.C. section 360bbb-3(b)(1), unless the authorization is terminated or revoked sooner. Performed at [facility], [place] [address], [place], [place] [address]   Blood Culture (routine x 2)     Status: None (Preliminary result)   Collection Time: 12/15/18  2:26 AM   Specimen: BLOOD  Result Value Ref Range Status   Specimen Description BLOOD RIGHT ARM  Final   Special Requests   Final    BOTTLES DRAWN AEROBIC AND ANAEROBIC Blood Culture adequate volume   Culture   Final    NO GROWTH 2 DAYS Performed at [facility], [place] [address], [place], [place] [address]    Report Status PENDING  Incomplete  MRSA PCR Screening     Status: None   Collection Time: 12/15/18  6:35 AM   Specimen: Nasopharyngeal  Result Value Ref Range Status   MRSA by PCR NEGATIVE NEGATIVE Final    Comment:        The GeneXpert MRSA Assay (FDA approved for NASAL specimens only), is one component of a comprehensive MRSA colonization surveillance program. It is not intended to diagnose MRSA infection nor to guide or monitor treatment for MRSA infections. Performed at [facility], [place] [address], [place],  [address]   Urine culture     Status: Abnormal   Collection Time: 12/15/18  8:55 AM   Specimen: In/Out Cath Urine  Result Value Ref Range Status   Specimen Description IN/OUT CATH URINE  Final   Special Requests   Final    NONE Performed at [facility], [place] [address], [place], [place] [address]    Culture (A)  Final    20,000 COLONIES/mL ENTEROCOCCUS FAECALIS 2,000 COLONIES/mL ESCHERICHIA COLI    Report Status 12/17/2018 FINAL  Final   Organism ID, Bacteria ENTEROCOCCUS FAECALIS (A)  Final   Organism ID, Bacteria ESCHERICHIA COLI (A)  Final      Susceptibility   Escherichia coli - MIC*    AMPICILLIN >=32 RESISTANT  Resistant     CEFAZOLIN <=4 SENSITIVE Sensitive     CEFTRIAXONE <=1 SENSITIVE Sensitive     CIPROFLOXACIN >=4 RESISTANT Resistant     GENTAMICIN <=1 SENSITIVE Sensitive     IMIPENEM <=0.25 SENSITIVE Sensitive     NITROFURANTOIN <=16 SENSITIVE Sensitive     TRIMETH/SULFA <=20 SENSITIVE Sensitive     AMPICILLIN/SULBACTAM 16 INTERMEDIATE Intermediate     PIP/TAZO <=4 SENSITIVE Sensitive     Extended ESBL NEGATIVE Sensitive     * 2,000 COLONIES/mL ESCHERICHIA COLI   Enterococcus faecalis - MIC*    AMPICILLIN <=2 SENSITIVE Sensitive     LEVOFLOXACIN 2 SENSITIVE Sensitive     NITROFURANTOIN <=16 SENSITIVE Sensitive     VANCOMYCIN 1 SENSITIVE Sensitive     * 20,000 COLONIES/mL ENTEROCOCCUS FAECALIS      Studies: No results found.  Scheduled Meds: . atorvastatin  40 mg Oral q1800  . Chlorhexidine Gluconate Cloth  6 each Topical Daily  . heparin  5,000 Units Subcutaneous Q8H  . insulin aspart  0-20 Units Subcutaneous Q4H  . insulin glargine  15 Units Subcutaneous QHS  . mouth rinse  15 mL Mouth Rinse BID  . sodium chloride flush  3 mL Intravenous Once    Continuous Infusions: . sodium chloride 250 mL (12/15/18 1816)  . cefTRIAXone (ROCEPHIN)  IV 2 g (12/16/18 1836)  . lactated ringers 125 mL/hr at 12/17/18 0718     LOS: 2 days     Kayleen Memos, MD [facility] Pager [phone]  If 7PM-7AM, please contact night-coverage [email or web address] Password [facility] 12/17/2018, 9:44 AM

## 2018-12-18 LAB — BASIC METABOLIC PANEL
Anion gap: 9 (ref 5–15)
BUN: 24 mg/dL — ABNORMAL HIGH (ref 6–20)
CO2: 28 mmol/L (ref 22–32)
Calcium: 8.8 mg/dL — ABNORMAL LOW (ref 8.9–10.3)
Chloride: 97 mmol/L — ABNORMAL LOW (ref 98–111)
Creatinine, Ser: 1.38 mg/dL — ABNORMAL HIGH (ref 0.44–1.00)
GFR calc Af Amer: 51 mL/min — ABNORMAL LOW (ref >60)
GFR calc non Af Amer: 44 mL/min — ABNORMAL LOW (ref >60)
Glucose, Bld: 139 mg/dL — ABNORMAL HIGH (ref 70–99)
Potassium: 3.5 mmol/L (ref 3.5–5.1)
Sodium: 134 mmol/L — ABNORMAL LOW (ref 135–145)

## 2018-12-18 LAB — GLUCOSE, CAPILLARY
Glucose-Capillary: 150 mg/dL — ABNORMAL HIGH (ref 70–99)
Glucose-Capillary: 193 mg/dL — ABNORMAL HIGH (ref 70–99)
Glucose-Capillary: 196 mg/dL — ABNORMAL HIGH (ref 70–99)
Glucose-Capillary: 215 mg/dL — ABNORMAL HIGH (ref 70–99)
Glucose-Capillary: 238 mg/dL — ABNORMAL HIGH (ref 70–99)

## 2018-12-18 MED ORDER — FLUCONAZOLE 200 MG PO TABS
200.0000 mg | ORAL_TABLET | Freq: Once | ORAL | Status: AC
  Administered 2018-12-18: 200 mg via ORAL
  Filled 2018-12-18 (×2): qty 1

## 2018-12-18 NOTE — Evaluation (Signed)
Occupational Therapy Evaluation Patient Details Name: Pamela Carney MRN: [id number] DOB: 25-Sep-1965 Today's Date: 12/18/2018    History of Present Illness 53 yr old obese F w/ PMHx of Bipolar Type 1, DM, CKD stage III, HTN, smoker (37 pack years), Nephrolithiasis including staghorn calculus that required nephrostomy Tubes presents to [facility] w/ complaints of abdominal pain accompanied with nausea and vomiting. Pt underwent cystoscopy, R retrograde ureterophyelogram, flouroscopic interpretation, and R double -J stent placement.   Clinical Impression   Pt PTA: lives with family, family assists with IADL as needed. Pt ambulating with no AD. Pt currently limited by fair activity tolerance. Pt currently performing [place] for ADL (modified independence for transfer to [facility] from bed) and ambulation in room with [place]. Pt performing ADL routine in sitting and requires assist for washing back. Pt with intermittent migraine pain. Pt appears very concerned about 2 small places that are bleeding during bath. Pt's RN in room upon OT leaving in order to assess. Pt does not require continued OT as pt is close to baseline functionings safely. OT signing off. VSS.      Follow Up Recommendations  No OT follow up;Supervision - Intermittent(Pt reports daughters will assist with ADL as needed)    Equipment Recommendations  None recommended by OT    Recommendations for Other Services       Precautions / Restrictions Precautions Precautions: Fall Restrictions Weight Bearing Restrictions: No      Mobility Bed Mobility Overal bed mobility: Modified Independent             General bed mobility comments: no physical assist required  Transfers Overall transfer level: Needs assistance Equipment used: None Transfers: Sit to/from Stand Sit to Stand: Supervision         General transfer comment: pt has been transferring to/from [facility]  every 1.5-2 hours due to frequent urination     Balance Overall balance assessment: Needs assistance Sitting-balance support: No upper extremity supported;Feet supported Sitting balance-Leahy Scale: Good     Standing balance support: Bilateral upper extremity supported Standing balance-Leahy Scale: Fair Standing balance comment: usinf IV pole for safe ambulation                           ADL either performed or assessed with clinical judgement   ADL Overall ADL's : At baseline                                       General ADL Comments: [place] for most ADL. Pt appears very concerned about 2 small places that are bleeding during bath, but OT attempting to reassure pt that RN will be in to see her.     Vision Baseline Vision/History: No visual deficits Vision Assessment?: No apparent visual deficits     Perception     Praxis      Pertinent Vitals/Pain Pain Assessment: 0-10 Pain Score: 6  Pain Location: migraine Pain Descriptors / Indicators: Headache Pain Intervention(s): Monitored during session     Hand Dominance Right   Extremity/Trunk Assessment Upper Extremity Assessment Upper Extremity Assessment: Overall WFL for tasks assessed   Lower Extremity Assessment Lower Extremity Assessment: Generalized weakness   Cervical / Trunk Assessment Cervical / Trunk Assessment: Normal   Communication Communication Communication: No difficulties   Cognition Arousal/Alertness: Awake/alert Behavior During Therapy: WFL for tasks assessed/performed Overall Cognitive Status: History of cognitive impairments -  at baseline                                     General Comments  VSS    Exercises     Shoulder Instructions      Home Living Family/patient expects to be discharged to:: Private residence Living Arrangements: Children Available Help at Discharge: Family;Available 24 hours/day Type of Home: House Home Access: Stairs to enter [employer] of Steps:  3 Entrance Stairs-Rails: Left Home Layout: Able to live on main level with bedroom/bathroom     Bathroom Shower/Tub: Teacher, early years/pre: Standard     Home Equipment: Environmental consultant - 2 wheels;Shower seat          Prior Functioning/Environment Level of Independence: Needs assistance  Gait / Transfers Assistance Needed: pt reports "my kids help me on the stairs into the house", uses RW ADL's / Homemaking Assistance Needed: daughter helps her get into tub            OT Problem List: Decreased activity tolerance      OT Treatment/Interventions:      OT Goals(Current goals can be found in the care plan section) Acute Rehab OT Goals Patient Stated Goal: home OT Goal Formulation: With patient  OT Frequency:     Barriers to D/C:            Co-evaluation              AM-PAC OT "6 Clicks" Daily Activity     Outcome Measure Help from another person eating meals?: None Help from another person taking care of personal grooming?: A Little Help from another person toileting, which includes using toliet, bedpan, or urinal?: None Help from another person bathing (including washing, rinsing, drying)?: None Help from another person to put on and taking off regular upper body clothing?: None Help from another person to put on and taking off regular lower body clothing?: A Little 6 Click Score: 22   End of Session Equipment Utilized During Treatment: Gait belt Nurse Communication: Mobility status  Activity Tolerance: Patient tolerated treatment well Patient left: in bed;with call bell/phone within reach;with nursing/sitter in room  OT Visit Diagnosis: Unsteadiness on feet (R26.81);Muscle weakness (generalized) (M62.81)                Time: [id number] OT Time Calculation (min): 33 min Charges:  OT General Charges $OT Visit: 1 Visit OT Evaluation $OT Eval Moderate Complexity: 1 Mod OT Treatments $Self Care/Home Management : 8-22 mins  Ebony Hail Harold Hedge) Marsa Aris  OTR/L Acute Rehabilitation Services Pager: [phone] Office: [place] 12/18/2018, 1:25 PM

## 2018-12-18 NOTE — Progress Notes (Signed)
PROGRESS NOTE  Pamela Carney [id number] DOB: 12-27-1965 DOA: 12/14/2018 PCP: Patient, No Pcp Per  HPI/Recap of past 24 hours: 53 yr old F w/ PMHx sig for nephrolithiasis and Bipolar disorder presented to [facility] w/ abdominal pain and emesis found to have a 11 mm obtsructng stone.Septic shock. POD 1 Cystoscopy, right retrograde ureteropyelogram, fluoroscopic interpretation, right double-J stent placement. 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 Per EDP documentation the patient stated that these symptoms began three days ago and have become progressively worse. She also reported subjective fevers, and a non productive cough.   POD 1 Cystoscopy, right retrograde ureteropyelogram, fluoroscopic interpretation, right double-J stent placement. Seen Overnight laying in bed. Ambulates to rest room. Only required severe pain meds once overnight post void. Tolerated diet. Off of Levophed gtt for several hours and remains hemodynamically stable.   Transferred to [facility] service on 12/17/2018.  12/18/18: Patient was seen and examined at bedside this morning.  Reports mild nausea is improved.  Reports headache with right eye involvement present for more than a week.  Also reports vaginal candidiasis.   Assessment/Plan: Active Problems:   Septic shock (HCC)  Sepsis with recent septic shock secondary to complicated Enterococcus faecalis/E. coli UTI in the setting of right distal ureteral calculus with pyelonephrosis and E. coli bacteremia Off pressors Urine culture taken on 12/15/2018 grew 20,000 colonies of Enterococcus faecalis, 2000 colonies of E. coli Resistance to ampicillin and ciprofloxacin intermediate resistance to  ampicillin/sulbactam Currently on Rocephin, continue  UTI with LUTS, POA Reports stabbing pain in her urinary tract If no improvement may start Pyridium  Vaginal candidiasis Ordered 1 dose of fluconazole 200 mg once  E. coli bacteremia likely from urinary source Blood cultures drawn on 12/15/2018 1 out of 2 bottles grew E. coli Resistant to ampicillin, ampicillin/sulbactam, ciprofloxacin Currently on Rocephin, continue Repeat blood culture x2 peripherally on 12/17/18 no growth times less than 24 hours Remove central line right IJ on 12/18/2018 once peripheral IV access is established.  Right distal ureteral calculus with pyonephrosis status post right cystoscopy and stent with urology on 12/15/2018 Continue to optimize pain control  Improving AKI Baseline creatinine appears to be 0.9 with GFR greater than 60 Presented with creatinine of 2.45 and GFR 22 Creatinine continues to improve on IV fluid hydration Creatinine 1.51 on 12/16/2018 Creatinine improving 1.38 on 12/18/2018 Continue to avoid nephrotoxins Continue gentle IV fluid hydration LR at 75 cc/h Continue to monitor urine output Obtain BMP in the morning  Type 2 diabetes with hyperglycemia Blood sugar is better controlled Hemoglobin A1c 8.6 on 12/15/2018 Continue insulin coverage Continue to hold off oral anti-glycemic's  Physical debility PT OT to assist Fall precautions  History of type I bipolar disorder Currently not on any medications.  Pharmacy review  Moderate aorto bi-iliac atherosclerotic disease Seen on CT study Lipid panel: LDL 99 Chol 183 TG 288 HDL 26 P Given that pt is >40 yrs of age has a h/o DM Started Atorvastatin 40 mg to be continued on discharge  Severe morbid obesity BMI 40 Recommend weight loss outpatient with regular physical activity and healthy dieting   Code Status: Full code  Family Communication: None at bedside  Disposition Plan: Patient is currently not appropriate  for  discharge due to complicated UTI requiring IV antibiotics in the setting of multiple comorbidities.  Patient will require at least another midnight for further evaluation and treatment of present condition.   Consultants:  PCCM  Procedures:     Antimicrobials:  Rocephin  DVT prophylaxis: Subcu heparin 3 times daily   Objective: Vitals:   12/17/18 0434 12/17/18 2013 12/18/18 0024 12/18/18 0448  BP: 127/69 (!) 89/60 106/68 (!) 142/87  Pulse:  77 64 72  Resp: 18 18 18 18   Temp: 98.4 F (36.9 C) 98.4 F (36.9 C) (!) 97.4 F (36.3 C) 98.7 F (37.1 C)  TempSrc: Oral Oral Oral Oral  SpO2: 90% 93% 93% 95%  Weight: 100.1 kg   101.3 kg  Height:        Intake/Output Summary (Last 24 hours) at 12/18/2018 1349 Last data filed at 12/18/2018 1100 Gross per 24 hour  Intake 4462.43 ml  Output 3600 ml  Net 862.43 ml   Filed Weights   12/16/18 0500 12/17/18 0434 12/18/18 0448  Weight: 100.7 kg 100.1 kg 101.3 kg    Exam:  . General: 53 y.o. year-old female obese in no acute distress.  Alert oriented x3.   . Cardiovascular: Regular rate and rhythm no rubs or gallops no JVD or thyromegaly. Marland Kitchen Respiratory: Clear to auscultation no wheezes no rales.  Poor inspiratory effort.  Abdomen: Obese nontender normal bowel sounds present.  Musculoskeletal: 2/4 pulses in all 4 extremities.   Psychiatry: Mood is appropriate for condition and setting.  Data Reviewed: CBC: Recent Labs  Lab 12/14/18 2058 12/15/18 0348 12/15/18 0406 12/15/18 0505 12/15/18 0707 12/15/18 0757 12/16/18 0541  WBC 12.5* 8.6  --   --   --  14.1* 9.3  NEUTROABS  --   --   --   --   --  12.4*  --   HGB 14.4 12.5 12.2 11.6* 11.6* 11.8* 10.2*  HCT 42.2 39.3 36.0 34.0* 34.0* 34.6* 32.1*  MCV 97.0 100.3*  --   --   --  95.8 100.6*  PLT 221 187  --   --   --  207 0000000   Basic Metabolic Panel: Recent Labs  Lab 12/14/18 2058 12/15/18 0348  12/15/18 0505 12/15/18 0600 12/15/18 0707 12/15/18 2154 12/16/18 0541  12/18/18 0549  NA 129*  --    < > 133* 133* 134*  --  137 134*  K 5.3*  --    < > 4.3 3.8 4.0  --  4.1 3.5  CL 92*  --   --   --  99  --   --  102 97*  CO2 22  --   --   --  22  --   --  25 28  GLUCOSE 245*  --   --   --  198*  --   --  181* 139*  BUN 42*  --   --   --  34*  --   --  36* 24*  CREATININE 2.45* 2.21*  --   --  1.87*  --   --  1.51* 1.38*  CALCIUM 9.5  --   --   --  7.8*  --   --  8.9 8.8*  MG  --   --   --   --   --   --  2.1  --   --   PHOS  --   --   --   --   --   --  3.7  --   --    < > = values in this interval not displayed.   GFR: Estimated Creatinine Clearance: 53.1 mL/min (A) (by C-G formula based on SCr of 1.38 mg/dL (H)). Liver Function Tests: Recent Labs  Lab 12/14/18 2058  AST 32  ALT 22  ALKPHOS 109  BILITOT 1.1  PROT 7.1  ALBUMIN 2.6*   Recent Labs  Lab 12/14/18 2058  LIPASE 25   No results for input(s): AMMONIA in the last 168 hours. Coagulation Profile: Recent Labs  Lab 12/15/18 0348  INR 1.1   Cardiac Enzymes: No results for input(s): CKTOTAL, CKMB, CKMBINDEX, TROPONINI in the last 168 hours. BNP (last 3 results) No results for input(s): PROBNP in the last 8760 hours. HbA1C: No results for input(s): HGBA1C in the last 72 hours. CBG: Recent Labs  Lab 12/17/18 1648 12/17/18 2142 12/18/18 0020 12/18/18 0744 12/18/18 1104  GLUCAP 245* 223* 193* 150* 196*   Lipid Profile: No results for input(s): CHOL, HDL, LDLCALC, TRIG, CHOLHDL, LDLDIRECT in the last 72 hours. Thyroid Function Tests: Recent Labs    12/16/18 0741  TSH 0.465  FREET4 0.88   Anemia Panel: Recent Labs    12/16/18 0741  VITAMINB12 454   Urine analysis:    Component Value Date/Time   COLORURINE YELLOW 12/14/2018 2030   APPEARANCEUR CLOUDY (A) 12/14/2018 2030   LABSPEC 1.016 12/14/2018 2030   PHURINE 6.0 12/14/2018 2030   GLUCOSEU NEGATIVE 12/14/2018 2030   HGBUR MODERATE (A) 12/14/2018 2030   [place] NEGATIVE 12/14/2018 2030   [place] 12/14/2018 2030   PROTEINUR 100 (A) 12/14/2018 2030   UROBILINOGEN 0.2 03/31/2010 0246   NITRITE NEGATIVE 12/14/2018 2030   LEUKOCYTESUR LARGE (A) 12/14/2018 2030   Sepsis Labs: @LABRCNTIP (procalcitonin:4,lacticidven:4)  ) Recent Results (from the past 240 hour(s))  Blood Culture (routine x 2)     Status: Abnormal   Collection Time: 12/15/18 12:16 AM   Specimen: BLOOD  Result Value Ref Range Status   Specimen Description BLOOD RIGHT FOREARM  Final   Special Requests   Final    BOTTLES DRAWN AEROBIC AND ANAEROBIC Blood Culture adequate volume   Culture  Setup Time   Final    GRAM NEGATIVE RODS IN BOTH AEROBIC AND ANAEROBIC BOTTLES CRITICAL RESULT CALLED TO, READ BACK BY AND VERIFIED WITHTillman Sers [facility] [id number] 12/15/18 A BROWNING Performed at [facility], [place] [address], [place], [place] [address]    Culture ESCHERICHIA COLI (A)  Final   Report Status 12/17/2018 FINAL  Final   Organism ID, Bacteria ESCHERICHIA COLI  Final      Susceptibility   Escherichia coli - MIC*    AMPICILLIN >=32 RESISTANT Resistant     CEFAZOLIN 16 SENSITIVE Sensitive     CEFEPIME <=1 SENSITIVE Sensitive     CEFTAZIDIME <=1 SENSITIVE Sensitive     CEFTRIAXONE <=1 SENSITIVE Sensitive     CIPROFLOXACIN >=4 RESISTANT Resistant     GENTAMICIN <=1 SENSITIVE Sensitive     IMIPENEM <=0.25 SENSITIVE Sensitive     TRIMETH/SULFA <=20 SENSITIVE Sensitive     AMPICILLIN/SULBACTAM >=32 RESISTANT Resistant     PIP/TAZO <=4 SENSITIVE Sensitive     Extended ESBL NEGATIVE Sensitive     * ESCHERICHIA COLI  Blood Culture ID Panel (Reflexed)     Status: Abnormal   Collection Time: 12/15/18 12:16 AM  Result Value Ref Range Status   Enterococcus species NOT DETECTED NOT DETECTED Final   Listeria monocytogenes NOT DETECTED  NOT DETECTED Final   Staphylococcus species NOT DETECTED NOT DETECTED Final   Staphylococcus aureus (BCID) NOT DETECTED NOT DETECTED Final   Streptococcus species NOT DETECTED NOT  DETECTED Final   Streptococcus agalactiae NOT DETECTED NOT DETECTED Final   Streptococcus pneumoniae NOT DETECTED NOT DETECTED Final   Streptococcus pyogenes NOT DETECTED NOT DETECTED Final   Acinetobacter baumannii NOT DETECTED NOT DETECTED Final   Enterobacteriaceae species DETECTED (A) NOT DETECTED Final    Comment: Enterobacteriaceae represent a large family of gram-negative bacteria, not a single organism. CRITICAL RESULT CALLED TO, READ BACK BY AND VERIFIED WITH: Tillman Sers PHARMD [id number] 12/15/18 A BROWNING    Enterobacter cloacae complex NOT DETECTED NOT DETECTED Final   Escherichia coli DETECTED (A) NOT DETECTED Final    Comment: CRITICAL RESULT CALLED TO, READ BACK BY AND VERIFIED WITH: Tillman Sers PHARMD [id number] 12/15/18 A BROWNING    Klebsiella oxytoca NOT DETECTED NOT DETECTED Final   Klebsiella pneumoniae NOT DETECTED NOT DETECTED Final   Proteus species NOT DETECTED NOT DETECTED Final   Serratia marcescens NOT DETECTED NOT DETECTED Final   Carbapenem resistance NOT DETECTED NOT DETECTED Final   Haemophilus influenzae NOT DETECTED NOT DETECTED Final   Neisseria meningitidis NOT DETECTED NOT DETECTED Final   Pseudomonas aeruginosa NOT DETECTED NOT DETECTED Final   Candida albicans NOT DETECTED NOT DETECTED Final   Candida glabrata NOT DETECTED NOT DETECTED Final   Candida krusei NOT DETECTED NOT DETECTED Final   Candida parapsilosis NOT DETECTED NOT DETECTED Final   Candida tropicalis NOT DETECTED NOT DETECTED Final    Comment: Performed at [facility], [place] [address], [place], [place] [address]  SARS Coronavirus 2 [facility] order, Performed in [facility] hospital lab) Nasopharyngeal Nasopharyngeal Swab     Status: None   Collection Time: 12/15/18 12:19 AM   Specimen: Nasopharyngeal Swab  Result Value Ref Range Status   SARS Coronavirus 2 NEGATIVE NEGATIVE Final    Comment: (NOTE) If result is NEGATIVE SARS-CoV-2 target nucleic acids are NOT DETECTED. The SARS-CoV-2 RNA  is generally detectable in upper and lower  respiratory specimens during the acute phase of infection. The lowest  concentration of SARS-CoV-2 viral copies this assay can detect is 250  copies / mL. A negative result does not preclude SARS-CoV-2 infection  and should not be used as the sole basis for treatment or other  patient management decisions.  A negative result may occur with  improper specimen collection / handling, submission of specimen other  than nasopharyngeal swab, presence of viral mutation(s) within the  areas targeted by this assay, and inadequate number of viral copies  (<250 copies / mL). A negative result must be combined with clinical  observations, patient history, and epidemiological information. If result is POSITIVE SARS-CoV-2 target nucleic acids are DETECTED. The SARS-CoV-2 RNA is generally detectable in upper and lower  respiratory specimens dur ing the acute phase of infection.  Positive  results are indicative of active infection with SARS-CoV-2.  Clinical  correlation with patient history and other diagnostic information is  necessary to determine patient infection status.  Positive results do  not rule out bacterial infection or co-infection with other viruses. If result is PRESUMPTIVE POSTIVE SARS-CoV-2 nucleic acids MAY BE PRESENT.   A presumptive positive result was obtained on the submitted specimen  and confirmed on repeat testing.  While 2019 novel coronavirus  (SARS-CoV-2) nucleic acids may be present in the submitted sample  additional confirmatory testing may be necessary for  epidemiological  and / or clinical management purposes  to differentiate between  SARS-CoV-2 and other Sarbecovirus currently known to infect humans.  If clinically indicated additional testing with an alternate test  methodology [phone]) is advised. The SARS-CoV-2 RNA is generally  detectable in upper and lower respiratory sp ecimens during the acute  phase of infection.  The expected result is Negative. Fact Sheet for Patients:  StrictlyIdeas.no Fact Sheet for Healthcare Providers: BankingDealers.co.za This test is not yet approved or cleared by the [place] FDA and has been authorized for detection and/or diagnosis of SARS-CoV-2 by FDA under an Emergency Use Authorization (EUA).  This EUA will remain in effect (meaning this test can be used) for the duration of the COVID-19 declaration under Section 564(b)(1) of the Act, 21 U.S.C. section 360bbb-3(b)(1), unless the authorization is terminated or revoked sooner. Performed at [facility], [place] [address] [place], [place] [address]   Blood Culture (routine x 2)     Status: None (Preliminary result)   Collection Time: 12/15/18  2:26 AM   Specimen: BLOOD  Result Value Ref Range Status   Specimen Description BLOOD RIGHT ARM  Final   Special Requests   Final    BOTTLES DRAWN AEROBIC AND ANAEROBIC Blood Culture adequate volume   Culture   Final    NO GROWTH 3 DAYS Performed at [facility], [place] [address] [place], [place] [address]    Report Status PENDING  Incomplete  MRSA PCR Screening     Status: None   Collection Time: 12/15/18  6:35 AM   Specimen: Nasopharyngeal  Result Value Ref Range Status   MRSA by PCR NEGATIVE NEGATIVE Final    Comment:        The GeneXpert MRSA Assay (FDA approved for NASAL specimens only), is one component of a comprehensive MRSA colonization surveillance program. It is not intended to diagnose MRSA infection nor to guide or monitor treatment for MRSA infections. Performed at [facility], [place] [address], [place], [place] [address]   Urine culture     Status: Abnormal   Collection Time: 12/15/18  8:55 AM   Specimen: In/Out Cath Urine  Result Value Ref Range Status   Specimen Description IN/OUT CATH URINE  Final   Special Requests   Final    NONE Performed at [facility], [place]  [address] [place], [place] [address]    Culture (A)  Final    20,000 COLONIES/mL ENTEROCOCCUS FAECALIS 2,000 COLONIES/mL ESCHERICHIA COLI    Report Status 12/17/2018 FINAL  Final   Organism ID, Bacteria ENTEROCOCCUS FAECALIS (A)  Final   Organism ID, Bacteria ESCHERICHIA COLI (A)  Final      Susceptibility   Escherichia coli - MIC*    AMPICILLIN >=32 RESISTANT Resistant     CEFAZOLIN <=4 SENSITIVE Sensitive     CEFTRIAXONE <=1 SENSITIVE Sensitive     CIPROFLOXACIN >=4 RESISTANT Resistant     GENTAMICIN <=1 SENSITIVE Sensitive     IMIPENEM <=0.25 SENSITIVE Sensitive     NITROFURANTOIN <=16 SENSITIVE Sensitive     TRIMETH/SULFA <=20 SENSITIVE Sensitive     AMPICILLIN/SULBACTAM 16 INTERMEDIATE Intermediate     PIP/TAZO <=4 SENSITIVE Sensitive     Extended ESBL NEGATIVE Sensitive     * 2,000 COLONIES/mL ESCHERICHIA COLI   Enterococcus faecalis - MIC*    AMPICILLIN <=2 SENSITIVE Sensitive     LEVOFLOXACIN 2 SENSITIVE Sensitive     NITROFURANTOIN <=16 SENSITIVE Sensitive  VANCOMYCIN 1 SENSITIVE Sensitive     * 20,000 COLONIES/mL ENTEROCOCCUS FAECALIS  Culture, blood (routine x 2)     Status: None (Preliminary result)   Collection Time: 12/17/18 10:13 AM   Specimen: BLOOD RIGHT ARM  Result Value Ref Range Status   Specimen Description BLOOD RIGHT ARM  Final   Special Requests   Final    AEROBIC BOTTLE ONLY Blood Culture results may not be optimal due to an inadequate volume of blood received in culture bottles   Culture   Final    NO GROWTH < 24 HOURS Performed at [facility], [place] [address], [place], [place] [address]    Report Status PENDING  Incomplete  Culture, blood (routine x 2)     Status: None (Preliminary result)   Collection Time: 12/17/18 10:18 AM   Specimen: BLOOD LEFT ARM  Result Value Ref Range Status   Specimen Description BLOOD LEFT ARM  Final   Special Requests AEROBIC BOTTLE ONLY Blood Culture adequate volume  Final   Culture   Final    NO GROWTH < 24  HOURS Performed at [facility], [place] [address], [place], [place] [address]    Report Status PENDING  Incomplete      Studies: No results found.  Scheduled Meds: . atorvastatin  40 mg Oral q1800  . Chlorhexidine Gluconate Cloth  6 each Topical Daily  . fluconazole  200 mg Oral Once  . heparin  5,000 Units Subcutaneous Q8H  . insulin aspart  0-5 Units Subcutaneous QHS  . insulin aspart  0-9 Units Subcutaneous TID WC  . insulin glargine  15 Units Subcutaneous QHS  . mouth rinse  15 mL Mouth Rinse BID  . sodium chloride flush  3 mL Intravenous Once    Continuous Infusions: . sodium chloride 250 mL (12/15/18 1816)  . cefTRIAXone (ROCEPHIN)  IV Stopped (12/17/18 1750)  . lactated ringers 75 mL/hr at 12/18/18 0335     LOS: 3 days     Kayleen Memos, MD [facility] Pager [phone]  If 7PM-7AM, please contact night-coverage [email or web address] Password [email or web address] 12/18/2018, 1:49 PM

## 2018-12-18 NOTE — Progress Notes (Addendum)
Nurse tech called out about her BP 77/49.  Rechecked manually and was 96/72, then 104/76.  Pt is feeling fine. Pt stated she got up and used a commode a few minutes before BP was taken.  Dr. [place] Crane made aware.. Received order to keep her IJ for today and decide tomorrow.  Idolina Primer, RN

## 2018-12-19 LAB — BASIC METABOLIC PANEL
Anion gap: 7 (ref 5–15)
BUN: 15 mg/dL (ref 6–20)
CO2: 31 mmol/L (ref 22–32)
Calcium: 8.8 mg/dL — ABNORMAL LOW (ref 8.9–10.3)
Chloride: 98 mmol/L (ref 98–111)
Creatinine, Ser: 1.19 mg/dL — ABNORMAL HIGH (ref 0.44–1.00)
GFR calc Af Amer: >60 mL/min (ref >60)
GFR calc non Af Amer: 52 mL/min — ABNORMAL LOW (ref >60)
Glucose, Bld: 138 mg/dL — ABNORMAL HIGH (ref 70–99)
Potassium: 3.5 mmol/L (ref 3.5–5.1)
Sodium: 136 mmol/L (ref 135–145)

## 2018-12-19 LAB — GLUCOSE, CAPILLARY
Glucose-Capillary: 123 mg/dL — ABNORMAL HIGH (ref 70–99)
Glucose-Capillary: 195 mg/dL — ABNORMAL HIGH (ref 70–99)
Glucose-Capillary: 181 mg/dL — ABNORMAL HIGH (ref 70–99)
Glucose-Capillary: 193 mg/dL — ABNORMAL HIGH (ref 70–99)

## 2018-12-19 LAB — FOLATE RBC
Folate, Hemolysate: 431 ng/mL
Folate, RBC: 1497 ng/mL (ref >498)
Hematocrit: 28.8 % — ABNORMAL LOW (ref 34.0–46.6)

## 2018-12-19 MED ORDER — PHENAZOPYRIDINE HCL 100 MG PO TABS
100.0000 mg | ORAL_TABLET | Freq: Three times a day (TID) | ORAL | Status: AC
  Administered 2018-12-19 – 2018-12-20 (×6): 100 mg via ORAL
  Filled 2018-12-19 (×6): qty 1

## 2018-12-19 NOTE — Plan of Care (Signed)

## 2018-12-19 NOTE — Progress Notes (Signed)
Physical Therapy Treatment Patient Details Name: Pamela Carney MRN: [id number] DOB: 1965/06/05 Today's Date: 12/19/2018    History of Present Illness 53 yr old obese F w/ PMHx of Bipolar Type 1, DM, CKD stage III, HTN, smoker (37 pack years), Nephrolithiasis including staghorn calculus that required nephrostomy Tubes presents to [facility] w/ complaints of abdominal pain accompanied with nausea and vomiting. Pt underwent cystoscopy, R retrograde ureterophyelogram, flouroscopic interpretation, and R double -J stent placement.    PT Comments    Pt was seen for mobility and note her minor instability that [facility] manages well.  Pt is not spending time on her feet walking much, but will encourage her to be up OOB and moving more to make better progress with her tolerance of activity.  Follow acutely and encourage nursing to walk her as well.  Talked with nursing about pt noting vision change as source of HA, and for this being why she is not sitting OOB in chair.   Follow Up Recommendations  No PT follow up;Supervision - Intermittent     Equipment Recommendations  None recommended by PT    Recommendations for Other Services       Precautions / Restrictions Precautions Precautions: Fall Precaution Comments: monitor feeling light headed initially wiht standing Restrictions Weight Bearing Restrictions: No    Mobility  Bed Mobility Overal bed mobility: Modified Independent                Transfers Overall transfer level: Modified independent Equipment used: None             General transfer comment: transfers to [employer]   Ambulation/Gait Ambulation/Gait assistance: Supervision;Min guard Gait Distance (Feet): 40 Feet Assistive device: 1 person hand held assist Gait Pattern/deviations: Step-through pattern;Decreased stride length;Trunk flexed Gait velocity: controlled Gait velocity interpretation: <1.31 ft/sec, indicative of household ambulator General Gait Details: trip past  bed and BR, using HHA to reduce her feeling of insecurity   Stairs             Wheelchair Mobility    Modified Rankin (Stroke Patients Only)       Balance Overall balance assessment: Needs assistance Sitting-balance support: Feet supported Sitting balance-Leahy Scale: Good     Standing balance support: Single extremity supported Standing balance-Leahy Scale: Fair                              Cognition Arousal/Alertness: Awake/alert Behavior During Therapy: WFL for tasks assessed/performed Overall Cognitive Status: History of cognitive impairments - at baseline                                 General Comments: pt with h/o bipolar d/c. Pt appears to be self limiting in ability to ambulate and care for herself as she states "Thats what I have kids for, to take care of me"      Exercises      General Comments General comments (skin integrity, edema, etc.): pt is able to walk with no AD but is safer looking with HHA.  Has RW for use at home      Pertinent Vitals/Pain Pain Assessment: Faces Faces Pain Scale: Hurts little more Pain Location: migraine Pain Descriptors / Indicators: Headache    Home Living                      Prior Function  PT Goals (current goals can now be found in the care plan section) Acute Rehab PT Goals Patient Stated Goal: to get home and feel better Progress towards PT goals: Progressing toward goals    Frequency    Min 3X/week      PT Plan Current plan remains appropriate    Co-evaluation              AM-PAC PT "6 Clicks" Mobility   Outcome Measure  Help needed turning from your back to your side while in a flat bed without using bedrails?: None Help needed moving from lying on your back to sitting on the side of a flat bed without using bedrails?: None Help needed moving to and from a bed to a chair (including a wheelchair)?: None Help needed standing up from a chair  using your arms (e.g., wheelchair or bedside chair)?: A Little Help needed to walk in hospital room?: A Little Help needed climbing 3-5 steps with a railing? : A Little 6 Click Score: 21    End of Session Equipment Utilized During Treatment: Gait belt Activity Tolerance: Patient tolerated treatment well Patient left: in bed;with call bell/phone within reach;with nursing/sitter in room Nurse Communication: Mobility status;Patient requests pain meds PT Visit Diagnosis: Unsteadiness on feet (R26.81);Muscle weakness (generalized) (M62.81);Difficulty in walking, not elsewhere classified (R26.2)     Time: [id number] PT Time Calculation (min) (ACUTE ONLY): 12 min  Charges:  $Gait Training: 8-22 mins                    Ramond Dial 12/19/2018, 8:07 PM   Mee Hives, PT MS Acute Rehab Dept. Number: [place] and [place]

## 2018-12-19 NOTE — Progress Notes (Signed)
PROGRESS NOTE  Pamela Carney [id number] DOB: 09-13-1965 DOA: 12/14/2018 PCP: Patient, No Pcp Per  HPI/Recap of past 24 hours: 53 yr old F w/ PMHx sig for nephrolithiasis and Bipolar disorder presented to [facility] w/ abdominal pain and emesis found to have a 11 mm obtsructng stone.Septic shock. POD 1 Cystoscopy, right retrograde ureteropyelogram, fluoroscopic interpretation, right double-J stent placement. 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 Per EDP documentation the patient stated that these symptoms began three days ago and have become progressively worse. She also reported subjective fevers, and a non productive cough.   POD 1 Cystoscopy, right retrograde ureteropyelogram, fluoroscopic interpretation, right double-J stent placement. Seen Overnight laying in bed. Ambulates to rest room. Only required severe pain meds once overnight post void. Tolerated diet. Off of Levophed gtt for several hours and remains hemodynamically stable.   Transferred to [facility] service on 12/17/2018.  12/18/18: Patient was seen and examined at bedside this morning.  Reports mild nausea is improved.  Reports headache with right eye involvement present for more than a week.  Also reports vaginal candidiasis.  12/19/18: Patient was seen and examined at her bedside this morning.  Reports ureteral spasm and discomfort in her lower abdomen.  Will start Pyridium.   Assessment/Plan: Active Problems:   Septic shock (HCC)  Sepsis with recent septic shock secondary to complicated Enterococcus faecalis/E. coli UTI in the setting of right distal ureteral calculus with pyelonephrosis and E. coli bacteremia Off pressors Urine culture taken on  12/15/2018 grew 20,000 colonies of Enterococcus faecalis, 2000 colonies of E. coli Resistance to ampicillin and ciprofloxacin intermediate resistance to ampicillin/sulbactam Currently on Rocephin day #5, continue  UTI with LUTS, POA Management as stated above  Ureteral spasm Start Pyridium 100 mg 3 times daily x2 days  Vaginal candidiasis Ordered 1 dose of fluconazole 200 mg once  Resolving prerenal AKI with IV fluid hydration Appears to be at her baseline creatinine 1.1 with GFR of 52 Presented with creatinine of 2.45 and GFR 22 DC IV fluid Continue to avoid nephrotoxins such as NSAIDs Good urine output greater than 3.9 L recorded in the last 24 hours  E. coli bacteremia likely from urinary source Blood cultures drawn on 12/15/2018 1 out of 2 bottles grew E. coli Resistant to ampicillin, ampicillin/sulbactam, ciprofloxacin Currently on Rocephin, continue Repeat blood culture x2 peripherally on 12/17/18 no growth times less than 24 hours Remove central line right IJ on 12/18/2018 once peripheral IV access is established.  Right distal ureteral calculus with pyonephrosis status post right cystoscopy and stent with urology on 12/15/2018 Continue to optimize pain control Reports persistent spasm; will contact urology.  Type 2 diabetes with hyperglycemia Blood sugar is better controlled Hemoglobin A1c 8.6 on 12/15/2018 Continue insulin coverage Continue to hold off oral anti-glycemic's  Physical debility: Improving PT OT had no further recommendations Continue to mobilize Fall precautions  History of type I bipolar disorder Currently not on any medications.  Pharmacy review  Moderate aorto bi-iliac atherosclerotic disease Seen on CT study Lipid panel: LDL 99 Chol 183 TG 288 HDL 26 P Given that pt is >40 yrs of age has a h/o DM Started Atorvastatin 40 mg to be continued on discharge  Severe morbid obesity BMI  40 Recommend weight loss outpatient with regular physical  activity and healthy dieting   Code Status: Full code  Family Communication: None at bedside  Disposition Plan: Possible discharge to home tomorrow 12/20/2018.   Consultants:  PCCM  Urology    Antimicrobials:  Rocephin  DVT prophylaxis: Subcu heparin 3 times daily   Objective: Vitals:   12/18/18 1619 12/18/18 2050 12/19/18 0253 12/19/18 0520  BP: 120/84 124/86 (!) 167/97 129/76  Pulse:  72  65  Resp:      Temp:  98.3 F (36.8 C)  97.8 F (36.6 C)  TempSrc:  Oral  Oral  SpO2:  94%  100%  Weight:    98.7 kg  Height:        Intake/Output Summary (Last 24 hours) at 12/19/2018 0805 Last data filed at 12/19/2018 0524 Gross per 24 hour  Intake 250 ml  Output 3950 ml  Net -3700 ml   Filed Weights   12/17/18 0434 12/18/18 0448 12/19/18 0520  Weight: 100.1 kg 101.3 kg 98.7 kg    Exam:  . General: 53 y.o. year-old female obese in no acute distress.  Alert oriented x3.   . Cardiovascular: Regular rate and rhythm no rubs or gallops no JVD or thyromegaly noted.   Marland Kitchen Respiratory: Clear to auscultation no wheezes no rales. Poor inspiratory effort.   Abdomen: Obese nontender normal bowel sounds present.   Musculoskeletal: 2 out of 4 pulses in all 4 extremities.   Psychiatry: Mood is appropriate for condition and setting.   Data Reviewed: CBC: Recent Labs  Lab 12/14/18 2058 12/15/18 0348 12/15/18 0406 12/15/18 0505 12/15/18 0707 12/15/18 0757 12/16/18 0541  WBC 12.5* 8.6  --   --   --  14.1* 9.3  NEUTROABS  --   --   --   --   --  12.4*  --   HGB 14.4 12.5 12.2 11.6* 11.6* 11.8* 10.2*  HCT 42.2 39.3 36.0 34.0* 34.0* 34.6* 32.1*  MCV 97.0 100.3*  --   --   --  95.8 100.6*  PLT 221 187  --   --   --  207 0000000   Basic Metabolic Panel: Recent Labs  Lab 12/14/18 2058 12/15/18 0348  12/15/18 0600 12/15/18 0707 12/15/18 2154 12/16/18 0541 12/18/18 0549 12/19/18 0500  NA 129*  --    < > 133* 134*  --  137 134* 136  K 5.3*  --    < > 3.8 4.0  --  4.1 3.5  3.5  CL 92*  --   --  99  --   --  102 97* 98  CO2 22  --   --  22  --   --  25 28 31   GLUCOSE 245*  --   --  198*  --   --  181* 139* 138*  BUN 42*  --   --  34*  --   --  36* 24* 15  CREATININE 2.45* 2.21*  --  1.87*  --   --  1.51* 1.38* 1.19*  CALCIUM 9.5  --   --  7.8*  --   --  8.9 8.8* 8.8*  MG  --   --   --   --   --  2.1  --   --   --   PHOS  --   --   --   --   --  3.7  --   --   --    < > =  values in this interval not displayed.   GFR: Estimated Creatinine Clearance: 60.7 mL/min (A) (by C-G formula based on SCr of 1.19 mg/dL (H)). Liver Function Tests: Recent Labs  Lab 12/14/18 2058  AST 32  ALT 22  ALKPHOS 109  BILITOT 1.1  PROT 7.1  ALBUMIN 2.6*   Recent Labs  Lab 12/14/18 2058  LIPASE 25   No results for input(s): AMMONIA in the last 168 hours. Coagulation Profile: Recent Labs  Lab 12/15/18 0348  INR 1.1   Cardiac Enzymes: No results for input(s): CKTOTAL, CKMB, CKMBINDEX, TROPONINI in the last 168 hours. BNP (last 3 results) No results for input(s): PROBNP in the last 8760 hours. HbA1C: No results for input(s): HGBA1C in the last 72 hours. CBG: Recent Labs  Lab 12/18/18 0744 12/18/18 1104 12/18/18 1615 12/18/18 1929 12/19/18 0724  GLUCAP 150* 196* 215* 238* 123*   Lipid Profile: No results for input(s): CHOL, HDL, LDLCALC, TRIG, CHOLHDL, LDLDIRECT in the last 72 hours. Thyroid Function Tests: No results for input(s): TSH, T4TOTAL, FREET4, T3FREE, THYROIDAB in the last 72 hours. Anemia Panel: No results for input(s): VITAMINB12, FOLATE, FERRITIN, TIBC, IRON, RETICCTPCT in the last 72 hours. Urine analysis:    Component Value Date/Time   COLORURINE YELLOW 12/14/2018 2030   APPEARANCEUR CLOUDY (A) 12/14/2018 2030   LABSPEC 1.016 12/14/2018 2030   PHURINE 6.0 12/14/2018 2030   GLUCOSEU NEGATIVE 12/14/2018 2030   HGBUR MODERATE (A) 12/14/2018 2030   [place] NEGATIVE 12/14/2018 2030   [place] 12/14/2018 2030   PROTEINUR 100  (A) 12/14/2018 2030   UROBILINOGEN 0.2 03/31/2010 0246   NITRITE NEGATIVE 12/14/2018 2030   LEUKOCYTESUR LARGE (A) 12/14/2018 2030   Sepsis Labs: @LABRCNTIP (procalcitonin:4,lacticidven:4)  ) Recent Results (from the past 240 hour(s))  Blood Culture (routine x 2)     Status: Abnormal   Collection Time: 12/15/18 12:16 AM   Specimen: BLOOD  Result Value Ref Range Status   Specimen Description BLOOD RIGHT FOREARM  Final   Special Requests   Final    BOTTLES DRAWN AEROBIC AND ANAEROBIC Blood Culture adequate volume   Culture  Setup Time   Final    GRAM NEGATIVE RODS IN BOTH AEROBIC AND ANAEROBIC BOTTLES CRITICAL RESULT CALLED TO, READ BACK BY AND VERIFIED WITHTillman Sers [facility] [id number] 12/15/18 A BROWNING Performed at [facility], [place] [address], [place], [place] [address]    Culture ESCHERICHIA COLI (A)  Final   Report Status 12/17/2018 FINAL  Final   Organism ID, Bacteria ESCHERICHIA COLI  Final      Susceptibility   Escherichia coli - MIC*    AMPICILLIN >=32 RESISTANT Resistant     CEFAZOLIN 16 SENSITIVE Sensitive     CEFEPIME <=1 SENSITIVE Sensitive     CEFTAZIDIME <=1 SENSITIVE Sensitive     CEFTRIAXONE <=1 SENSITIVE Sensitive     CIPROFLOXACIN >=4 RESISTANT Resistant     GENTAMICIN <=1 SENSITIVE Sensitive     IMIPENEM <=0.25 SENSITIVE Sensitive     TRIMETH/SULFA <=20 SENSITIVE Sensitive     AMPICILLIN/SULBACTAM >=32 RESISTANT Resistant     PIP/TAZO <=4 SENSITIVE Sensitive     Extended ESBL NEGATIVE Sensitive     * ESCHERICHIA COLI  Blood Culture ID Panel (Reflexed)     Status: Abnormal   Collection Time: 12/15/18 12:16 AM  Result Value Ref Range Status   Enterococcus species NOT DETECTED NOT DETECTED Final   Listeria monocytogenes NOT DETECTED NOT DETECTED Final   Staphylococcus species NOT DETECTED NOT  DETECTED Final   Staphylococcus aureus (BCID) NOT DETECTED NOT DETECTED Final   Streptococcus species NOT DETECTED NOT DETECTED Final   Streptococcus agalactiae NOT  DETECTED NOT DETECTED Final   Streptococcus pneumoniae NOT DETECTED NOT DETECTED Final   Streptococcus pyogenes NOT DETECTED NOT DETECTED Final   Acinetobacter baumannii NOT DETECTED NOT DETECTED Final   Enterobacteriaceae species DETECTED (A) NOT DETECTED Final    Comment: Enterobacteriaceae represent a large family of gram-negative bacteria, not a single organism. CRITICAL RESULT CALLED TO, READ BACK BY AND VERIFIED WITH: Tillman Sers PHARMD [id number] 12/15/18 A BROWNING    Enterobacter cloacae complex NOT DETECTED NOT DETECTED Final   Escherichia coli DETECTED (A) NOT DETECTED Final    Comment: CRITICAL RESULT CALLED TO, READ BACK BY AND VERIFIED WITH: Tillman Sers PHARMD [id number] 12/15/18 A BROWNING    Klebsiella oxytoca NOT DETECTED NOT DETECTED Final   Klebsiella pneumoniae NOT DETECTED NOT DETECTED Final   Proteus species NOT DETECTED NOT DETECTED Final   Serratia marcescens NOT DETECTED NOT DETECTED Final   Carbapenem resistance NOT DETECTED NOT DETECTED Final   Haemophilus influenzae NOT DETECTED NOT DETECTED Final   Neisseria meningitidis NOT DETECTED NOT DETECTED Final   Pseudomonas aeruginosa NOT DETECTED NOT DETECTED Final   Candida albicans NOT DETECTED NOT DETECTED Final   Candida glabrata NOT DETECTED NOT DETECTED Final   Candida krusei NOT DETECTED NOT DETECTED Final   Candida parapsilosis NOT DETECTED NOT DETECTED Final   Candida tropicalis NOT DETECTED NOT DETECTED Final    Comment: Performed at [facility], [place] [address], [place], [place] [address]  SARS Coronavirus 2 [facility] order, Performed in [facility] hospital lab) Nasopharyngeal Nasopharyngeal Swab     Status: None   Collection Time: 12/15/18 12:19 AM   Specimen: Nasopharyngeal Swab  Result Value Ref Range Status   SARS Coronavirus 2 NEGATIVE NEGATIVE Final    Comment: (NOTE) If result is NEGATIVE SARS-CoV-2 target nucleic acids are NOT DETECTED. The SARS-CoV-2 RNA is generally detectable in upper and lower   respiratory specimens during the acute phase of infection. The lowest  concentration of SARS-CoV-2 viral copies this assay can detect is 250  copies / mL. A negative result does not preclude SARS-CoV-2 infection  and should not be used as the sole basis for treatment or other  patient management decisions.  A negative result may occur with  improper specimen collection / handling, submission of specimen other  than nasopharyngeal swab, presence of viral mutation(s) within the  areas targeted by this assay, and inadequate number of viral copies  (<250 copies / mL). A negative result must be combined with clinical  observations, patient history, and epidemiological information. If result is POSITIVE SARS-CoV-2 target nucleic acids are DETECTED. The SARS-CoV-2 RNA is generally detectable in upper and lower  respiratory specimens dur ing the acute phase of infection.  Positive  results are indicative of active infection with SARS-CoV-2.  Clinical  correlation with patient history and other diagnostic information is  necessary to determine patient infection status.  Positive results do  not rule out bacterial infection or co-infection with other viruses. If result is PRESUMPTIVE POSTIVE SARS-CoV-2 nucleic acids MAY BE PRESENT.   A presumptive positive result was obtained on the submitted specimen  and confirmed on repeat testing.  While 2019 novel coronavirus  (SARS-CoV-2) nucleic acids may be present in the submitted sample  additional confirmatory testing may be necessary for epidemiological  and / or clinical management purposes  to  differentiate between  SARS-CoV-2 and other Sarbecovirus currently known to infect humans.  If clinically indicated additional testing with an alternate test  methodology [phone]) is advised. The SARS-CoV-2 RNA is generally  detectable in upper and lower respiratory sp ecimens during the acute  phase of infection. The expected result is Negative. Fact  Sheet for Patients:  StrictlyIdeas.no Fact Sheet for Healthcare Providers: BankingDealers.co.za This test is not yet approved or cleared by the [place] FDA and has been authorized for detection and/or diagnosis of SARS-CoV-2 by FDA under an Emergency Use Authorization (EUA).  This EUA will remain in effect (meaning this test can be used) for the duration of the COVID-19 declaration under Section 564(b)(1) of the Act, 21 U.S.C. section 360bbb-3(b)(1), unless the authorization is terminated or revoked sooner. Performed at [facility], [place] [address] [place], [place] [address]   Blood Culture (routine x 2)     Status: None (Preliminary result)   Collection Time: 12/15/18  2:26 AM   Specimen: BLOOD  Result Value Ref Range Status   Specimen Description BLOOD RIGHT ARM  Final   Special Requests   Final    BOTTLES DRAWN AEROBIC AND ANAEROBIC Blood Culture adequate volume   Culture   Final    NO GROWTH 3 DAYS Performed at [facility], [place] [address] [place], [place] [address]    Report Status PENDING  Incomplete  MRSA PCR Screening     Status: None   Collection Time: 12/15/18  6:35 AM   Specimen: Nasopharyngeal  Result Value Ref Range Status   MRSA by PCR NEGATIVE NEGATIVE Final    Comment:        The GeneXpert MRSA Assay (FDA approved for NASAL specimens only), is one component of a comprehensive MRSA colonization surveillance program. It is not intended to diagnose MRSA infection nor to guide or monitor treatment for MRSA infections. Performed at [facility], [place] [address], [place], [place] [address]   Urine culture     Status: Abnormal   Collection Time: 12/15/18  8:55 AM   Specimen: In/Out Cath Urine  Result Value Ref Range Status   Specimen Description IN/OUT CATH URINE  Final   Special Requests   Final    NONE Performed at [facility], [place] [address] [place], [place] [address]     Culture (A)  Final    20,000 COLONIES/mL ENTEROCOCCUS FAECALIS 2,000 COLONIES/mL ESCHERICHIA COLI    Report Status 12/17/2018 FINAL  Final   Organism ID, Bacteria ENTEROCOCCUS FAECALIS (A)  Final   Organism ID, Bacteria ESCHERICHIA COLI (A)  Final      Susceptibility   Escherichia coli - MIC*    AMPICILLIN >=32 RESISTANT Resistant     CEFAZOLIN <=4 SENSITIVE Sensitive     CEFTRIAXONE <=1 SENSITIVE Sensitive     CIPROFLOXACIN >=4 RESISTANT Resistant     GENTAMICIN <=1 SENSITIVE Sensitive     IMIPENEM <=0.25 SENSITIVE Sensitive     NITROFURANTOIN <=16 SENSITIVE Sensitive     TRIMETH/SULFA <=20 SENSITIVE Sensitive     AMPICILLIN/SULBACTAM 16 INTERMEDIATE Intermediate     PIP/TAZO <=4 SENSITIVE Sensitive     Extended ESBL NEGATIVE Sensitive     * 2,000 COLONIES/mL ESCHERICHIA COLI   Enterococcus faecalis - MIC*    AMPICILLIN <=2 SENSITIVE Sensitive     LEVOFLOXACIN 2 SENSITIVE Sensitive     NITROFURANTOIN <=16 SENSITIVE Sensitive     VANCOMYCIN 1 SENSITIVE Sensitive     *  20,000 COLONIES/mL ENTEROCOCCUS FAECALIS  Culture, blood (routine x 2)     Status: None (Preliminary result)   Collection Time: 12/17/18 10:13 AM   Specimen: BLOOD RIGHT ARM  Result Value Ref Range Status   Specimen Description BLOOD RIGHT ARM  Final   Special Requests   Final    AEROBIC BOTTLE ONLY Blood Culture results may not be optimal due to an inadequate volume of blood received in culture bottles   Culture   Final    NO GROWTH < 24 HOURS Performed at [facility], [place] [address], [place], [place] [address]    Report Status PENDING  Incomplete  Culture, blood (routine x 2)     Status: None (Preliminary result)   Collection Time: 12/17/18 10:18 AM   Specimen: BLOOD LEFT ARM  Result Value Ref Range Status   Specimen Description BLOOD LEFT ARM  Final   Special Requests AEROBIC BOTTLE ONLY Blood Culture adequate volume  Final   Culture   Final    NO GROWTH < 24 HOURS Performed at [facility], [place] [address] [place], [place] [address]    Report Status PENDING  Incomplete      Studies: No results found.  Scheduled Meds: . atorvastatin  40 mg Oral q1800  . Chlorhexidine Gluconate Cloth  6 each Topical Daily  . heparin  5,000 Units Subcutaneous Q8H  . insulin aspart  0-5 Units Subcutaneous QHS  . insulin aspart  0-9 Units Subcutaneous TID WC  . insulin glargine  15 Units Subcutaneous QHS  . mouth rinse  15 mL Mouth Rinse BID  . sodium chloride flush  3 mL Intravenous Once    Continuous Infusions: . sodium chloride 250 mL (12/15/18 1816)  . cefTRIAXone (ROCEPHIN)  IV 2 g (12/18/18 1805)  . lactated ringers 75 mL/hr at 12/18/18 1803     LOS: 4 days     Kayleen Memos, MD [facility] Pager [phone]  If 7PM-7AM, please contact night-coverage [email or web address] Password [facility] 12/19/2018, 8:05 AM

## 2018-12-19 NOTE — Progress Notes (Signed)
Made Robin,RN aware of the central line discontinue order.  Gerlene Burdock- VAST

## 2018-12-20 ENCOUNTER — Inpatient Hospital Stay (HOSPITAL_COMMUNITY): Payer: 59

## 2018-12-20 LAB — BASIC METABOLIC PANEL
Anion gap: 10 (ref 5–15)
BUN: 20 mg/dL (ref 6–20)
CO2: 26 mmol/L (ref 22–32)
Calcium: 9 mg/dL (ref 8.9–10.3)
Chloride: 102 mmol/L (ref 98–111)
Creatinine, Ser: 1.27 mg/dL — ABNORMAL HIGH (ref 0.44–1.00)
GFR calc Af Amer: 56 mL/min — ABNORMAL LOW (ref >60)
GFR calc non Af Amer: 48 mL/min — ABNORMAL LOW (ref >60)
Glucose, Bld: 126 mg/dL — ABNORMAL HIGH (ref 70–99)
Potassium: 4.1 mmol/L (ref 3.5–5.1)
Sodium: 138 mmol/L (ref 135–145)

## 2018-12-20 LAB — GLUCOSE, CAPILLARY
Glucose-Capillary: 130 mg/dL — ABNORMAL HIGH (ref 70–99)
Glucose-Capillary: 176 mg/dL — ABNORMAL HIGH (ref 70–99)
Glucose-Capillary: 130 mg/dL — ABNORMAL HIGH (ref 70–99)
Glucose-Capillary: 259 mg/dL — ABNORMAL HIGH (ref 70–99)

## 2018-12-20 LAB — CULTURE, BLOOD (ROUTINE X 2)
Culture: NO GROWTH
Special Requests: ADEQUATE

## 2018-12-20 IMAGING — DX DG ABDOMEN 1V
2 series · 2 of 2 positions shown · non-contrast
Comparison: None.

CLINICAL DATA: Right flank pain. Recent ureteral stent placement.
Urolithiasis.

EXAM:
ABDOMEN - 1 VIEW

[abdomen kub (1 of 2)]
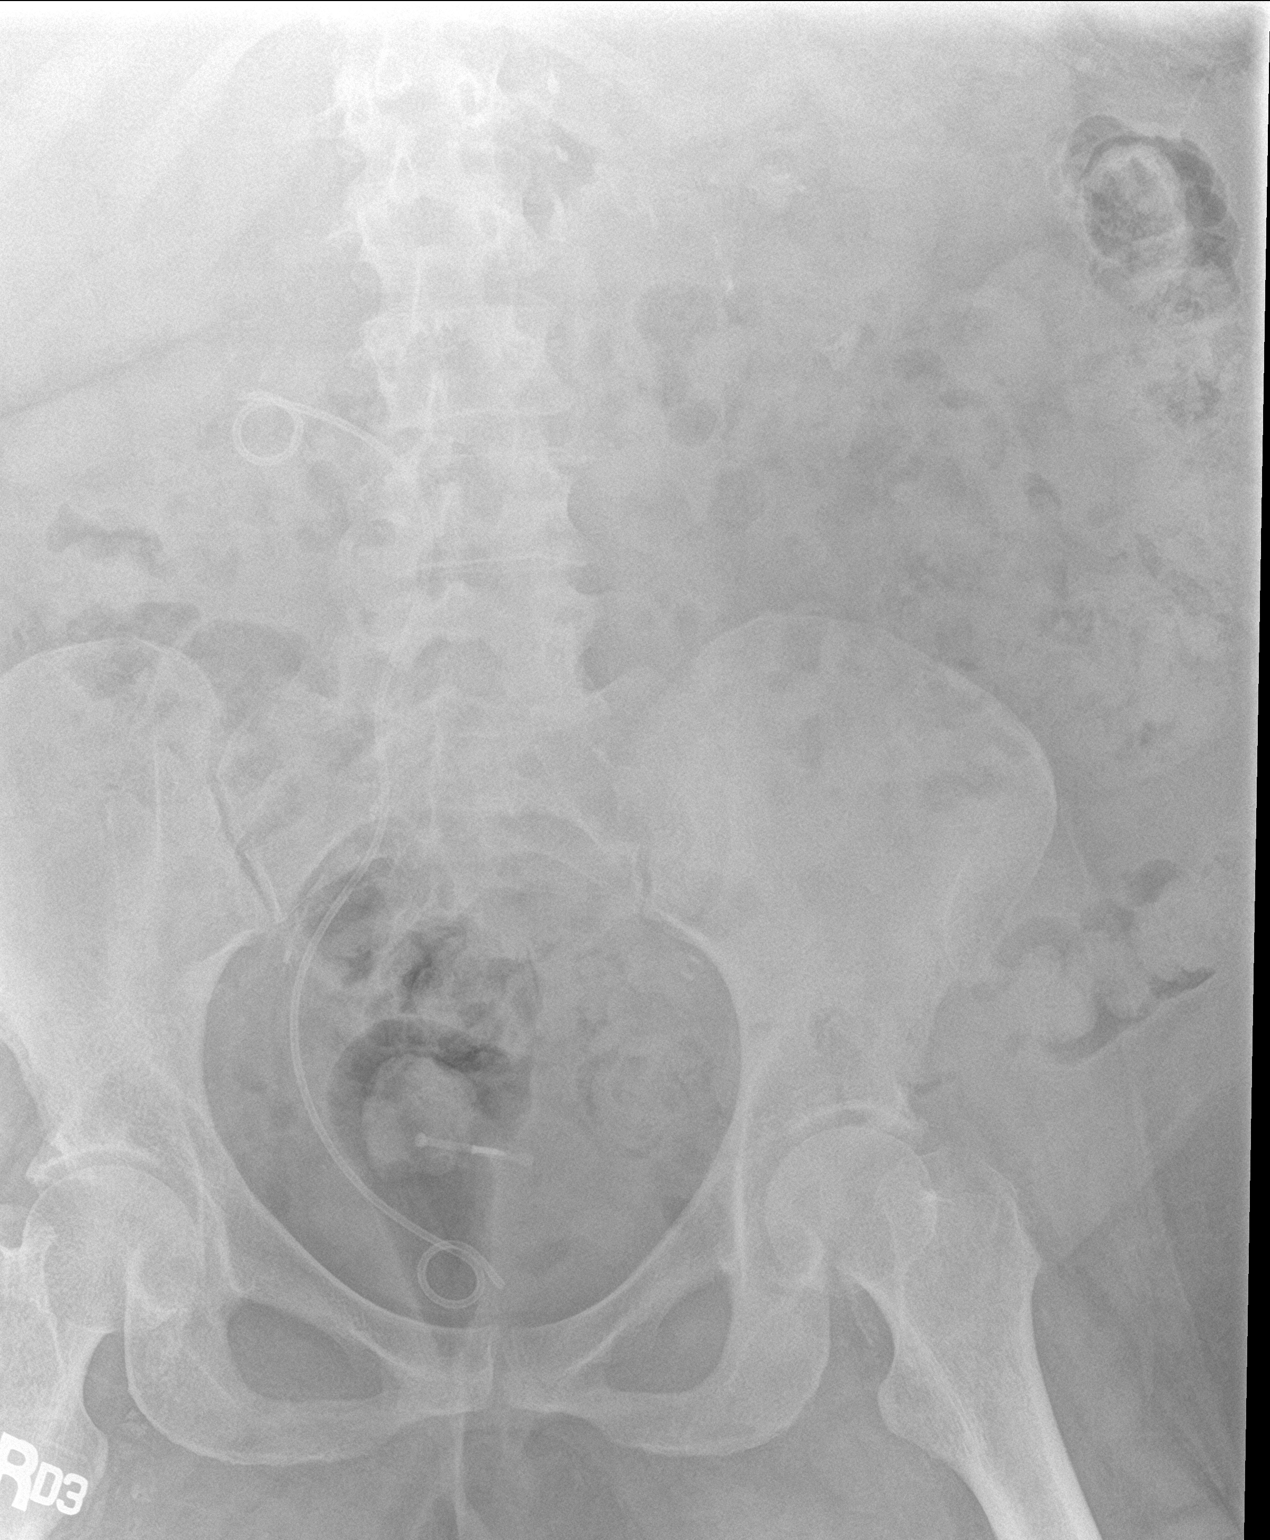

[abdomen kub (2 of 2)]
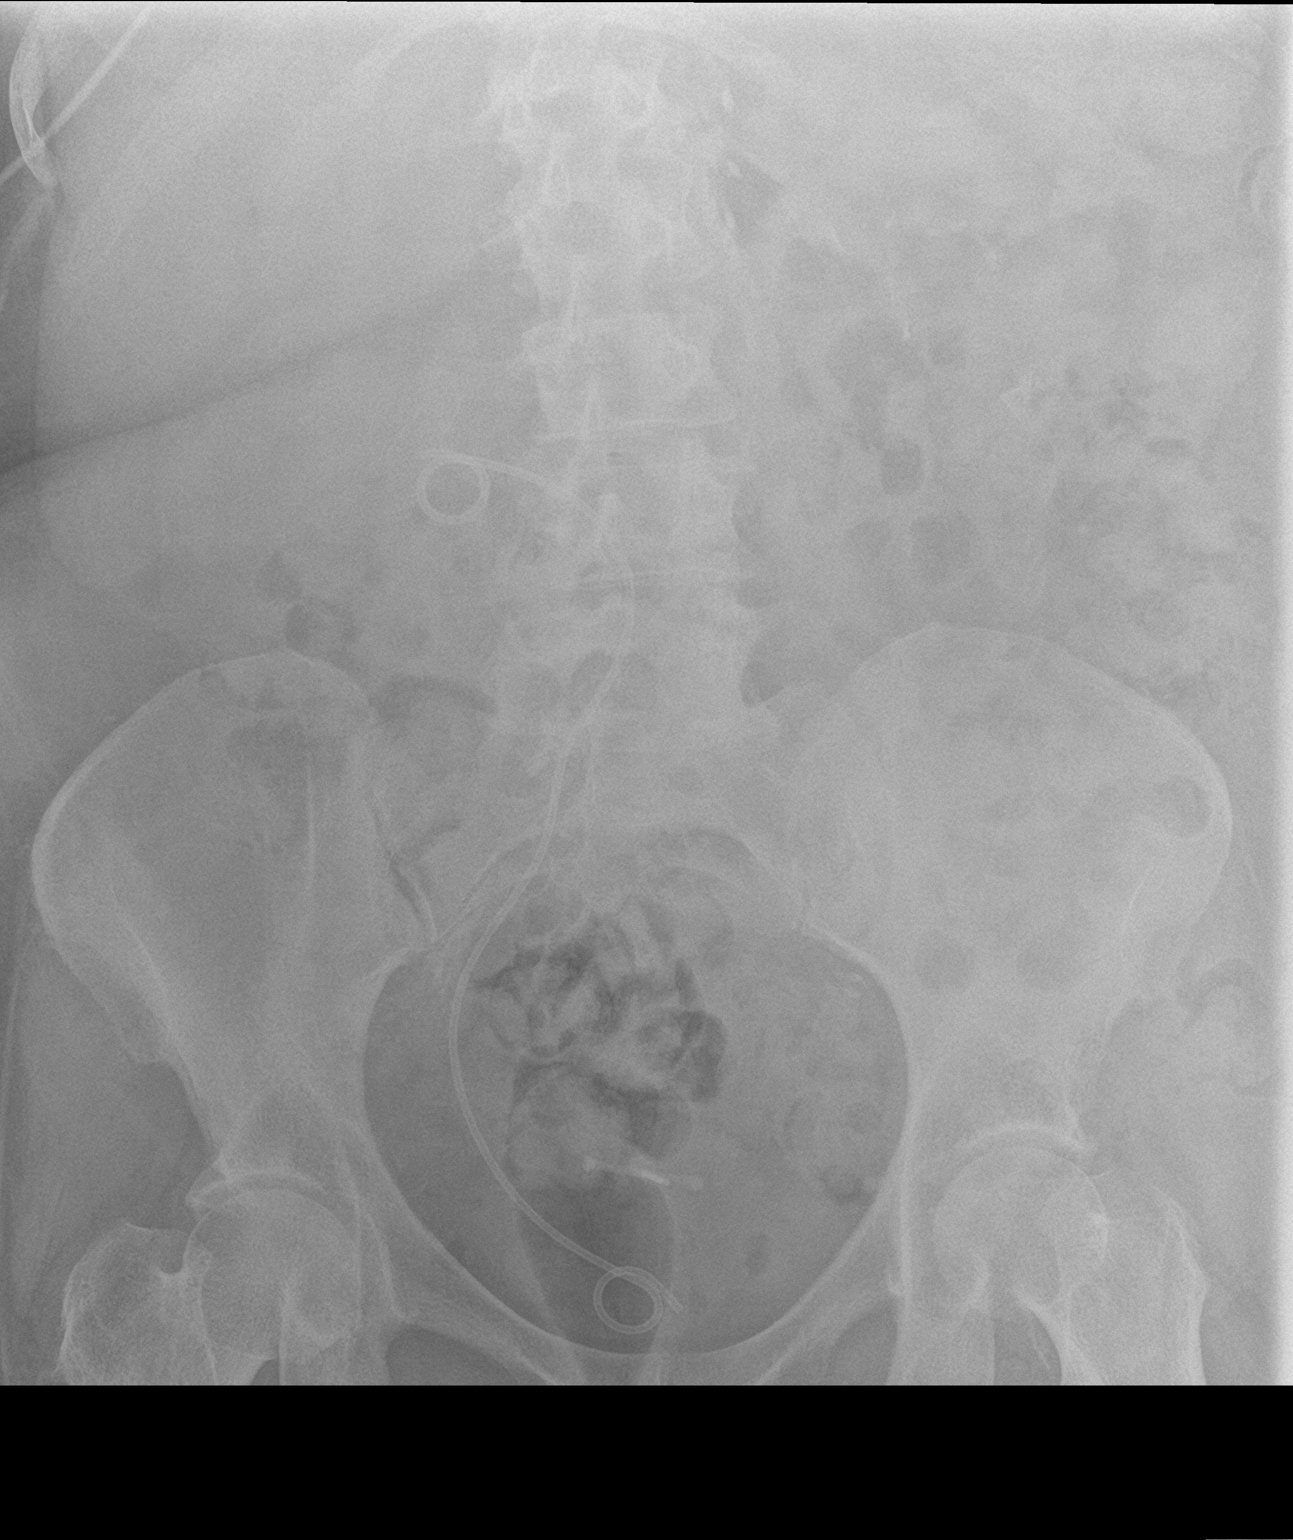

[2 of 2 positions shown; findings below may reference images not displayed]

FINDINGS: Double pigtail right ureteral stent is seen in expected position. No
definite radiopaque calculi are seen along the course of the stent.
Several radiodensities are seen in the expected region of left
kidney, consistent with small left renal calculi. IUD noted in the
pelvis. Bowel gas pattern is normal.
IMPRESSION: Right ureteral stent in appropriate position. No definite radiopaque
ureteral calculi identified.

Left nephrolithiasis.

## 2018-12-20 MED ORDER — POLYETHYLENE GLYCOL 3350 17 G PO PACK
17.0000 g | PACK | Freq: Every day | ORAL | Status: DC
  Administered 2018-12-20: 18:00:00 17 g via ORAL
  Filled 2018-12-20 (×4): qty 1

## 2018-12-20 MED ORDER — SENNOSIDES-DOCUSATE SODIUM 8.6-50 MG PO TABS
2.0000 | ORAL_TABLET | Freq: Two times a day (BID) | ORAL | Status: DC
  Administered 2018-12-21 – 2018-12-25 (×9): 2 via ORAL
  Filled 2018-12-20 (×9): qty 2

## 2018-12-20 MED ORDER — CEFDINIR 300 MG PO CAPS
300.0000 mg | ORAL_CAPSULE | Freq: Two times a day (BID) | ORAL | Status: AC
  Administered 2018-12-20 – 2018-12-25 (×10): 300 mg via ORAL
  Filled 2018-12-20 (×10): qty 1

## 2018-12-20 MED ORDER — OXYBUTYNIN CHLORIDE 5 MG PO TABS
2.5000 mg | ORAL_TABLET | Freq: Three times a day (TID) | ORAL | Status: DC
  Administered 2018-12-20 – 2018-12-25 (×16): 2.5 mg via ORAL
  Filled 2018-12-20 (×16): qty 1

## 2018-12-20 MED ORDER — BELLADONNA ALKALOIDS-OPIUM 16.2-60 MG RE SUPP
1.0000 | Freq: Once | RECTAL | Status: DC
  Filled 2018-12-20 (×2): qty 1

## 2018-12-20 NOTE — Progress Notes (Signed)
Physical Therapy Treatment Patient Details Name: Pamela Carney MRN: [id number] DOB: 10/26/1965 Today's Date: 12/20/2018    History of Present Illness 53 yr old obese F w/ PMHx of Bipolar Type 1, DM, CKD stage III, HTN, smoker (37 pack years), Nephrolithiasis including staghorn calculus that required nephrostomy Tubes presents to [facility] w/ complaints of abdominal pain accompanied with nausea and vomiting. Pt underwent cystoscopy, R retrograde ureterophyelogram, flouroscopic interpretation, and R double -J stent placement.    PT Comments    Pt progressing well with mobility. Eager to go home.    Follow Up Recommendations  No PT follow up;Supervision - Intermittent     Equipment Recommendations  None recommended by PT    Recommendations for Other Services       Precautions / Restrictions Precautions Precautions: None Restrictions Weight Bearing Restrictions: No    Mobility  Bed Mobility Overal bed mobility: Modified Independent                Transfers Overall transfer level: Modified independent Equipment used: Rolling walker (2 wheeled) Transfers: Sit to/from Stand Sit to Stand: Modified independent (Device/Increase time)            Ambulation/Gait Ambulation/Gait assistance: Supervision Gait Distance (Feet): 200 Feet Assistive device: Rolling walker (2 wheeled) Gait Pattern/deviations: Step-through pattern;Decreased stride length;Trunk flexed Gait velocity: decr Gait velocity interpretation: 1.31 - 2.62 ft/sec, indicative of limited community ambulator General Gait Details: Pt looks down at feet with amb. She reports if she looks up it makes her dizzy.   Stairs             Wheelchair Mobility    Modified Rankin (Stroke Patients Only)       Balance Overall balance assessment: Needs assistance Sitting-balance support: Feet supported Sitting balance-Leahy Scale: Good     Standing balance support: No upper extremity supported Standing  balance-Leahy Scale: Fair                              Cognition Arousal/Alertness: Awake/alert Behavior During Therapy: WFL for tasks assessed/performed Overall Cognitive Status: Within Functional Limits for tasks assessed                                        Exercises      General Comments        Pertinent Vitals/Pain Pain Assessment: No/denies pain    Home Living                      Prior Function            PT Goals (current goals can now be found in the care plan section) Acute Rehab PT Goals Patient Stated Goal: to go home Progress towards PT goals: Progressing toward goals    Frequency    Min 3X/week      PT Plan Current plan remains appropriate    Co-evaluation              AM-PAC PT "6 Clicks" Mobility   Outcome Measure  Help needed turning from your back to your side while in a flat bed without using bedrails?: None Help needed moving from lying on your back to sitting on the side of a flat bed without using bedrails?: None Help needed moving to and from a bed to a chair (including a wheelchair)?: None  Help needed standing up from a chair using your arms (e.g., wheelchair or bedside chair)?: None Help needed to walk in hospital room?: A Little Help needed climbing 3-5 steps with a railing? : A Little 6 Click Score: 22    End of Session   Activity Tolerance: Patient tolerated treatment well Patient left: with call bell/phone within reach;in chair   PT Visit Diagnosis: Unsteadiness on feet (R26.81);Muscle weakness (generalized) (M62.81);Difficulty in walking, not elsewhere classified (R26.2)     Time: [id number] PT Time Calculation (min) (ACUTE ONLY): 13 min  Charges:  $Gait Training: 8-22 mins                     [place] Pager [phone] Office [place] 12/20/2018, 5:18 PM

## 2018-12-20 NOTE — Plan of Care (Signed)
  Problem: Clinical Measurements: Goal: Ability to maintain clinical measurements within normal limits will improve Outcome: Progressing   Problem: Health Behavior/Discharge Planning: Goal: Ability to manage health-related needs will improve Outcome: Completed/Met   Problem: Clinical Measurements: Goal: Diagnostic test results will improve Outcome: Completed/Met Goal: Respiratory complications will improve Outcome: Completed/Met Goal: Cardiovascular complication will be avoided Outcome: Completed/Met   Problem: Nutrition: Goal: Adequate nutrition will be maintained Outcome: Completed/Met   Problem: Coping: Goal: Level of anxiety will decrease Outcome: Completed/Met   Problem: Elimination: Goal: Will not experience complications related to bowel motility Outcome: Completed/Met Goal: Will not experience complications related to urinary retention Outcome: Completed/Met   Problem: Pain Managment: Goal: General experience of comfort will improve Outcome: Completed/Met   Problem: Safety: Goal: Ability to remain free from injury will improve Outcome: Completed/Met

## 2018-12-20 NOTE — Progress Notes (Addendum)
PROGRESS NOTE  Pamela Carney [id number] DOB: [birth year] DOA: 12/14/2018 PCP: Patient, No Pcp Per  HPI/Recap of past 24 hours: 53 yr old F w/ PMHx sig for nephrolithiasis and Bipolar disorder presented to [facility] w/ abdominal pain and emesis found to have a 11 mm obtsructng stone.Septic shock. POD 1 Cystoscopy, right retrograde ureteropyelogram, fluoroscopic interpretation, right double-J stent placement. 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 Per EDP documentation the patient stated that these symptoms began three days ago and have become progressively worse. She also reported subjective fevers, and a non productive cough.   POD 1 Cystoscopy, right retrograde ureteropyelogram, fluoroscopic interpretation, right double-J stent placement. Seen Overnight laying in bed. Ambulates to rest room. Only required severe pain meds once overnight post void. Tolerated diet. Off of Levophed gtt for several hours and remains hemodynamically stable.   Transferred to [facility] service on 12/17/2018.   Vaginal candidiasis treated on 12/18/18 with 1 dose p.o. fluconazole 200 mg once.   Reports ureteral spasm and discomfort of lower abdomen on 12/19/2018, started on Pyridium.  12/20/18: She was seen and examined at bedside this morning.  Reports persistent right elbow discomfort, states she consistently sees rings from her right eye.  Ophthalmology consulted, Dr. Talbert Forest. Also reports persistent bladder spasms. Added oxybutynin to already started pyridium. Discussed with urology, will follow up outpatient. Plan to DC on 2 days of pyridium and oxybutinin until seen in the urology clinic.   Assessment/Plan: Active Problems:   Septic shock  (HCC)  Resolving sepsis with recent septic shock secondary to complicated Enterococcus faecalis/E. coli UTI in the setting of right distal ureteral calculus with pyelonephrosis and E. coli bacteremia Off pressors Urine culture taken on 12/15/2018 grew 20,000 colonies of Enterococcus faecalis, 2000 colonies of E. coli Resistance to ampicillin and ciprofloxacin intermediate resistance to ampicillin/sulbactam Completed 5 days of Rocephin Switch to Keflex 500 mg 3 times daily x5 days  Right eye discomfort, unclear etiology  TSH normal Diabetes well controlled at this time States she has been seeing rings with her right eye and is causing dizziness Ophthalmology contacted Dr. Talbert Forest for consultation.  E. coli bacteremia, poa Management for stated above Repeated blood cultures x2 done on 12/17/2018- to date This morning well to IV antibiotics We will complete total of 10 days of antibiotics  UTI with LUTS, POA Management as stated above  Ureteral/bladder spasm Continue Pyridium 100 mg 3 times daily x3 days Start oxybutynin and continue until seen at the neurology clinic Obtain KUB to assure proper placement of stent  Vaginal candidiasis Ordered 1 dose of fluconazole 200 mg once  Resolving prerenal AKI with IV fluid hydration Appears to be at her baseline creatinine 1.1 with GFR of 52 Presented with creatinine of 2.45 and GFR 22 DC IV fluid Continue to avoid nephrotoxins such as NSAIDs Good urine output greater than 3.9 L recorded in the last 24 hours  E. coli bacteremia likely from urinary source Blood cultures drawn on 12/15/2018 1 out of 2 bottles grew E. coli Resistant to ampicillin, ampicillin/sulbactam, ciprofloxacin Completed 5 days of Rocephin Repeat blood culture x2 peripherally on 12/17/18 no growth to date Remove central line right IJ on 12/19/2018   Right distal ureteral calculus with pyonephrosis  status post right cystoscopy and stent with urology on 12/15/2018 Continue  to optimize pain control Reports persistent spasm; will contact urology.  Type 2 diabetes with hyperglycemia Blood sugar is better controlled Hemoglobin A1c 8.6 on 12/15/2018 Continue insulin coverage Continue to hold off oral anti-glycemic's  Physical debility: Improving PT OT had no further recommendations Continue to mobilize Fall precautions  History of type I bipolar disorder Currently not on any medications.  Pharmacy review  Moderate aorto bi-iliac atherosclerotic disease Seen on CT study Lipid panel: LDL 99 Chol 183 TG 288 HDL 26 P Given that pt is >40 yrs of age has a h/o DM Started Atorvastatin 40 mg to be continued on discharge  Severe morbid obesity BMI 40 Recommend weight loss outpatient with regular physical activity and healthy dieting   Code Status: Full code  Family Communication: None at bedside  Disposition Plan: Possible discharge to home tomorrow 12/20/2018.   Consultants:  PCCM  Urology    Antimicrobials:  Rocephin  DVT prophylaxis: Subcu heparin 3 times daily   Objective: Vitals:   12/19/18 2058 12/19/18 2115 12/19/18 2257 12/20/18 0531  BP: (!) 80/58 (!) 92/54 (!) 116/92 104/60  Pulse: 75   70  Resp:      Temp: 97.8 F (36.6 C)   97.7 F (36.5 C)  TempSrc: Oral   Oral  SpO2: 91%   99%  Weight:    97.2 kg  Height:        Intake/Output Summary (Last 24 hours) at 12/20/2018 1353 Last data filed at 12/20/2018 0500 Gross per 24 hour  Intake 120 ml  Output 3400 ml  Net -3280 ml   Filed Weights   12/18/18 0448 12/19/18 0520 12/20/18 0531  Weight: 101.3 kg 98.7 kg 97.2 kg    Exam:  . General: 53 y.o. year-old female obese in no acute distress.  Alert and oriented x3.   . Cardiovascular: Regular rhythm no rubs or gallops no JVD or thyromegaly noted. Marland Kitchen Respiratory: Clear to auscultation no wheezes no rales.  Poor inspiratory effort.   Abdomen: Obese nontender nondistended normal bowel sounds present.   Musculoskeletal: 2  out of 4 pulses in all 4 extremities. Psychiatry: Mood is appropriate for condition and setting..   Data Reviewed: CBC: Recent Labs  Lab 12/14/18 2058 12/15/18 0348 12/15/18 0406 12/15/18 0505 12/15/18 [id number] 12/15/18 0757 12/16/18 0541 12/16/18 0741  WBC 12.5* 8.6  --   --   --  14.1* 9.3  --   NEUTROABS  --   --   --   --   --  12.4*  --   --   HGB 14.4 12.5 12.2 11.6* 11.6* 11.8* 10.2*  --   HCT 42.2 39.3 36.0 34.0* 34.0* 34.6* 32.1* 28.8*  MCV 97.0 100.3*  --   --   --  95.8 100.6*  --   PLT 221 187  --   --   --  207 175  --    Basic Metabolic Panel: Recent Labs  Lab 12/15/18 0600 12/15/18 0707 12/15/18 2154 12/16/18 0541 12/18/18 0549 12/19/18 0500 12/20/18 0402  NA 133* 134*  --  137 134* 136 138  K 3.8 4.0  --  4.1 3.5 3.5 4.1  CL 99  --   --  102 97* 98 102  CO2 22  --   --  25 28 31 26   GLUCOSE 198*  --   --  181* 139* 138* 126*  BUN 34*  --   --  36* 24* 15 20  CREATININE 1.87*  --   --  1.51* 1.38* 1.19* 1.27*  CALCIUM 7.8*  --   --  8.9 8.8* 8.8* 9.0  MG  --   --  2.1  --   --   --   --   PHOS  --   --  3.7  --   --   --   --    GFR: Estimated Creatinine Clearance: 56.4 mL/min (A) (by C-G formula based on SCr of 1.27 mg/dL (H)). Liver Function Tests: Recent Labs  Lab 12/14/18 2058  AST 32  ALT 22  ALKPHOS 109  BILITOT 1.1  PROT 7.1  ALBUMIN 2.6*   Recent Labs  Lab 12/14/18 2058  LIPASE 25   No results for input(s): AMMONIA in the last 168 hours. Coagulation Profile: Recent Labs  Lab 12/15/18 0348  INR 1.1   Cardiac Enzymes: No results for input(s): CKTOTAL, CKMB, CKMBINDEX, TROPONINI in the last 168 hours. BNP (last 3 results) No results for input(s): PROBNP in the last 8760 hours. HbA1C: No results for input(s): HGBA1C in the last 72 hours. CBG: Recent Labs  Lab 12/19/18 1106 12/19/18 1624 12/19/18 2052 12/20/18 0724 12/20/18 1147  GLUCAP 195* 181* 193* 130* 176*   Lipid Profile: No results for input(s): CHOL, HDL,  LDLCALC, TRIG, CHOLHDL, LDLDIRECT in the last 72 hours. Thyroid Function Tests: No results for input(s): TSH, T4TOTAL, FREET4, T3FREE, THYROIDAB in the last 72 hours. Anemia Panel: No results for input(s): VITAMINB12, FOLATE, FERRITIN, TIBC, IRON, RETICCTPCT in the last 72 hours. Urine analysis:    Component Value Date/Time   COLORURINE YELLOW 12/14/2018 2030   APPEARANCEUR CLOUDY (A) 12/14/2018 2030   LABSPEC 1.016 12/14/2018 2030   PHURINE 6.0 12/14/2018 2030   GLUCOSEU NEGATIVE 12/14/2018 2030   HGBUR MODERATE (A) 12/14/2018 2030   [place] NEGATIVE 12/14/2018 2030   [place] 12/14/2018 2030   PROTEINUR 100 (A) 12/14/2018 2030   UROBILINOGEN 0.2 03/31/2010 0246   NITRITE NEGATIVE 12/14/2018 2030   LEUKOCYTESUR LARGE (A) 12/14/2018 2030   Sepsis Labs: @LABRCNTIP (procalcitonin:4,lacticidven:4)  ) Recent Results (from the past 240 hour(s))  Blood Culture (routine x 2)     Status: Abnormal   Collection Time: 12/15/18 12:16 AM   Specimen: BLOOD  Result Value Ref Range Status   Specimen Description BLOOD RIGHT FOREARM  Final   Special Requests   Final    BOTTLES DRAWN AEROBIC AND ANAEROBIC Blood Culture adequate volume   Culture  Setup Time   Final    GRAM NEGATIVE RODS IN BOTH AEROBIC AND ANAEROBIC BOTTLES CRITICAL RESULT CALLED TO, READ BACK BY AND VERIFIED WITHTillman Sers [facility] [id number] 12/15/18 A BROWNING Performed at [facility], [place] [address], [place], [place] [address]    Culture ESCHERICHIA COLI (A)  Final   Report Status 12/17/2018 FINAL  Final   Organism ID, Bacteria ESCHERICHIA COLI  Final      Susceptibility   Escherichia coli - MIC*    AMPICILLIN >=32 RESISTANT Resistant     CEFAZOLIN 16 SENSITIVE Sensitive     CEFEPIME <=1 SENSITIVE Sensitive     CEFTAZIDIME <=1 SENSITIVE Sensitive     CEFTRIAXONE <=1 SENSITIVE Sensitive     CIPROFLOXACIN >=4 RESISTANT Resistant     GENTAMICIN <=1 SENSITIVE Sensitive     IMIPENEM <=0.25 SENSITIVE Sensitive      TRIMETH/SULFA <=20 SENSITIVE Sensitive     AMPICILLIN/SULBACTAM >=32 RESISTANT Resistant     PIP/TAZO <=4  SENSITIVE Sensitive     Extended ESBL NEGATIVE Sensitive     * ESCHERICHIA COLI  Blood Culture ID Panel (Reflexed)     Status: Abnormal   Collection Time: 12/15/18 12:16 AM  Result Value Ref Range Status   Enterococcus species NOT DETECTED NOT DETECTED Final   Listeria monocytogenes NOT DETECTED NOT DETECTED Final   Staphylococcus species NOT DETECTED NOT DETECTED Final   Staphylococcus aureus (BCID) NOT DETECTED NOT DETECTED Final   Streptococcus species NOT DETECTED NOT DETECTED Final   Streptococcus agalactiae NOT DETECTED NOT DETECTED Final   Streptococcus pneumoniae NOT DETECTED NOT DETECTED Final   Streptococcus pyogenes NOT DETECTED NOT DETECTED Final   Acinetobacter baumannii NOT DETECTED NOT DETECTED Final   Enterobacteriaceae species DETECTED (A) NOT DETECTED Final    Comment: Enterobacteriaceae represent a large family of gram-negative bacteria, not a single organism. CRITICAL RESULT CALLED TO, READ BACK BY AND VERIFIED WITH: Tillman Sers PHARMD [id number] 12/15/18 A BROWNING    Enterobacter cloacae complex NOT DETECTED NOT DETECTED Final   Escherichia coli DETECTED (A) NOT DETECTED Final    Comment: CRITICAL RESULT CALLED TO, READ BACK BY AND VERIFIED WITH: Tillman Sers PHARMD [id number] 12/15/18 A BROWNING    Klebsiella oxytoca NOT DETECTED NOT DETECTED Final   Klebsiella pneumoniae NOT DETECTED NOT DETECTED Final   Proteus species NOT DETECTED NOT DETECTED Final   Serratia marcescens NOT DETECTED NOT DETECTED Final   Carbapenem resistance NOT DETECTED NOT DETECTED Final   Haemophilus influenzae NOT DETECTED NOT DETECTED Final   Neisseria meningitidis NOT DETECTED NOT DETECTED Final   Pseudomonas aeruginosa NOT DETECTED NOT DETECTED Final   Candida albicans NOT DETECTED NOT DETECTED Final   Candida glabrata NOT DETECTED NOT DETECTED Final   Candida krusei NOT DETECTED NOT  DETECTED Final   Candida parapsilosis NOT DETECTED NOT DETECTED Final   Candida tropicalis NOT DETECTED NOT DETECTED Final    Comment: Performed at [facility], [place] [address] [place], [place] [address]  SARS Coronavirus 2 [facility] order, Performed in [facility] hospital lab) Nasopharyngeal Nasopharyngeal Swab     Status: None   Collection Time: 12/15/18 12:19 AM   Specimen: Nasopharyngeal Swab  Result Value Ref Range Status   SARS Coronavirus 2 NEGATIVE NEGATIVE Final    Comment: (NOTE) If result is NEGATIVE SARS-CoV-2 target nucleic acids are NOT DETECTED. The SARS-CoV-2 RNA is generally detectable in upper and lower  respiratory specimens during the acute phase of infection. The lowest  concentration of SARS-CoV-2 viral copies this assay can detect is 250  copies / mL. A negative result does not preclude SARS-CoV-2 infection  and should not be used as the sole basis for treatment or other  patient management decisions.  A negative result may occur with  improper specimen collection / handling, submission of specimen other  than nasopharyngeal swab, presence of viral mutation(s) within the  areas targeted by this assay, and inadequate number of viral copies  (<250 copies / mL). A negative result must be combined with clinical  observations, patient history, and epidemiological information. If result is POSITIVE SARS-CoV-2 target nucleic acids are DETECTED. The SARS-CoV-2 RNA is generally detectable in upper and lower  respiratory specimens dur ing the acute phase of infection.  Positive  results are indicative of active infection with SARS-CoV-2.  Clinical  correlation with patient history and other diagnostic information is  necessary to determine patient infection status.  Positive results do  not rule out bacterial infection or co-infection  with other viruses. If result is PRESUMPTIVE POSTIVE SARS-CoV-2 nucleic acids MAY BE PRESENT.   A presumptive positive result was  obtained on the submitted specimen  and confirmed on repeat testing.  While 2019 novel coronavirus  (SARS-CoV-2) nucleic acids may be present in the submitted sample  additional confirmatory testing may be necessary for epidemiological  and / or clinical management purposes  to differentiate between  SARS-CoV-2 and other Sarbecovirus currently known to infect humans.  If clinically indicated additional testing with an alternate test  methodology [phone]) is advised. The SARS-CoV-2 RNA is generally  detectable in upper and lower respiratory sp ecimens during the acute  phase of infection. The expected result is Negative. Fact Sheet for Patients:  StrictlyIdeas.no Fact Sheet for Healthcare Providers: BankingDealers.co.za This test is not yet approved or cleared by the [place] FDA and has been authorized for detection and/or diagnosis of SARS-CoV-2 by FDA under an Emergency Use Authorization (EUA).  This EUA will remain in effect (meaning this test can be used) for the duration of the COVID-19 declaration under Section 564(b)(1) of the Act, 21 U.S.C. section 360bbb-3(b)(1), unless the authorization is terminated or revoked sooner. Performed at [facility], [place] [address], [place], [place] [address]   Blood Culture (routine x 2)     Status: None   Collection Time: 12/15/18  2:26 AM   Specimen: BLOOD  Result Value Ref Range Status   Specimen Description BLOOD RIGHT ARM  Final   Special Requests   Final    BOTTLES DRAWN AEROBIC AND ANAEROBIC Blood Culture adequate volume   Culture   Final    NO GROWTH 5 DAYS Performed at [facility], [place] [address] [place], [place] [address]    Report Status 12/20/2018 FINAL  Final  MRSA PCR Screening     Status: None   Collection Time: 12/15/18  6:35 AM   Specimen: Nasopharyngeal  Result Value Ref Range Status   MRSA by PCR NEGATIVE NEGATIVE Final    Comment:        The  GeneXpert MRSA Assay (FDA approved for NASAL specimens only), is one component of a comprehensive MRSA colonization surveillance program. It is not intended to diagnose MRSA infection nor to guide or monitor treatment for MRSA infections. Performed at [facility], [place] [address], [place], [place] [address]   Urine culture     Status: Abnormal   Collection Time: 12/15/18  8:55 AM   Specimen: In/Out Cath Urine  Result Value Ref Range Status   Specimen Description IN/OUT CATH URINE  Final   Special Requests   Final    NONE Performed at [facility], [place] [address], [place], [place] [address]    Culture (A)  Final    20,000 COLONIES/mL ENTEROCOCCUS FAECALIS 2,000 COLONIES/mL ESCHERICHIA COLI    Report Status 12/17/2018 FINAL  Final   Organism ID, Bacteria ENTEROCOCCUS FAECALIS (A)  Final   Organism ID, Bacteria ESCHERICHIA COLI (A)  Final      Susceptibility   Escherichia coli - MIC*    AMPICILLIN >=32 RESISTANT Resistant     CEFAZOLIN <=4 SENSITIVE Sensitive     CEFTRIAXONE <=1 SENSITIVE Sensitive     CIPROFLOXACIN >=4 RESISTANT Resistant     GENTAMICIN <=1 SENSITIVE Sensitive     IMIPENEM <=0.25 SENSITIVE Sensitive     NITROFURANTOIN <=16 SENSITIVE Sensitive     TRIMETH/SULFA <=20 SENSITIVE Sensitive     AMPICILLIN/SULBACTAM 16 INTERMEDIATE Intermediate  PIP/TAZO <=4 SENSITIVE Sensitive     Extended ESBL NEGATIVE Sensitive     * 2,000 COLONIES/mL ESCHERICHIA COLI   Enterococcus faecalis - MIC*    AMPICILLIN <=2 SENSITIVE Sensitive     LEVOFLOXACIN 2 SENSITIVE Sensitive     NITROFURANTOIN <=16 SENSITIVE Sensitive     VANCOMYCIN 1 SENSITIVE Sensitive     * 20,000 COLONIES/mL ENTEROCOCCUS FAECALIS  Culture, blood (routine x 2)     Status: None (Preliminary result)   Collection Time: 12/17/18 10:13 AM   Specimen: BLOOD RIGHT ARM  Result Value Ref Range Status   Specimen Description BLOOD RIGHT ARM  Final   Special Requests   Final    AEROBIC BOTTLE ONLY  Blood Culture results may not be optimal due to an inadequate volume of blood received in culture bottles   Culture   Final    NO GROWTH 3 DAYS Performed at [facility], [place] [address], [place], [place] [address]    Report Status PENDING  Incomplete  Culture, blood (routine x 2)     Status: None (Preliminary result)   Collection Time: 12/17/18 10:18 AM   Specimen: BLOOD LEFT ARM  Result Value Ref Range Status   Specimen Description BLOOD LEFT ARM  Final   Special Requests AEROBIC BOTTLE ONLY Blood Culture adequate volume  Final   Culture   Final    NO GROWTH 3 DAYS Performed at [facility], [place] [address], [place], [place] [address]    Report Status PENDING  Incomplete      Studies: No results found.  Scheduled Meds: . atorvastatin  40 mg Oral q1800  . Chlorhexidine Gluconate Cloth  6 each Topical Daily  . heparin  5,000 Units Subcutaneous Q8H  . insulin aspart  0-5 Units Subcutaneous QHS  . insulin aspart  0-9 Units Subcutaneous TID WC  . insulin glargine  15 Units Subcutaneous QHS  . mouth rinse  15 mL Mouth Rinse BID  . oxybutynin  2.5 mg Oral TID  . phenazopyridine  100 mg Oral TID WC  . sodium chloride flush  3 mL Intravenous Once    Continuous Infusions: . sodium chloride 250 mL (12/15/18 1816)  . cefTRIAXone (ROCEPHIN)  IV 2 g (12/19/18 1745)  . lactated ringers 75 mL/hr at 12/18/18 1803     LOS: 5 days     Kayleen Memos, MD [facility] Pager [phone]  If 7PM-7AM, please contact night-coverage [email or web address] Password [email or web address] 12/20/2018, 1:53 PM

## 2018-12-20 NOTE — Consult Note (Signed)
  Ophthalmology Consult      53 yo female with h/o Bipolar type 1, DM, CKD stage III, HTN, smoker admitted for septic shock from obstructing stone.  Pt states for past two weeks she has had visual changes in her right peripheral vision.  She says its like waves or circles and she can not see well on that side.  She thought it was just her right eye.     On exam pt was found to be 20/25 in both eyes.  PERRL, EOM intact, On confrontational visual filed pt was found to have findings consistent with right homonymous hemianopsia.  She could not see in either eye past midline on the right side.  A HVF would be more accurate in depecting this defect but there was difficulty seeing any object on right side of vision in either eye.   IOP was 16 in both eyes.  Conjunctiva/sclera were clear/white in both eyes.  Cornea was clear in both eyes, Iris was reactive and round in both eyes.  Lens was clear in both eyes.  On Dilated exam Retina was flat and intact in both eyes with no diabetic retinopathy seen and c/d was 0.4 in both eyes.  No sign of retinal tears or breaks and no sign of infection.  A/P 1. Right homonymous hemianopsia:  There is much room for error and the validity of the test could be affected by participation.  A formal HVF would be much more accurate.  The patient is getting an MRI tonight and a  left parietal/temporal defect would correlate with the visual field change.  If this is not present on MRI then HVF would be next step to see if field defects are present.  The rest of the exam was within normal limits and no ocular causes of field defects were found.  Bilateral visual field changes are neuro in origin and pt states during my exam that both eyes have right sided waves and visual changes.  Pt needs to have HVF scheduled at my office upon discharge.  Thank you for allowing me to participate in the care of this patient.  Please feel free to contact me if you have any concerns.  Darleen Crocker,  M.D. (cell) [phone]

## 2018-12-21 ENCOUNTER — Inpatient Hospital Stay (HOSPITAL_COMMUNITY): Payer: 59

## 2018-12-21 DIAGNOSIS — I639 Cerebral infarction, unspecified: Secondary | ICD-10-CM

## 2018-12-21 LAB — GLUCOSE, CAPILLARY
Glucose-Capillary: 133 mg/dL — ABNORMAL HIGH (ref 70–99)
Glucose-Capillary: 147 mg/dL — ABNORMAL HIGH (ref 70–99)
Glucose-Capillary: 187 mg/dL — ABNORMAL HIGH (ref 70–99)
Glucose-Capillary: 143 mg/dL — ABNORMAL HIGH (ref 70–99)

## 2018-12-21 IMAGING — MR MR HEAD W/O CM
9 of 11 series · 33 of 48 positions shown · non-contrast
Comparison: CT head [DATE]

CLINICAL DATA: Vertigo.  Right-sided hemianopia.  Headache.

EXAM:
MRI HEAD WITHOUT CONTRAST
TECHNIQUE: Multiplanar, multiecho pulse sequences of the brain and surrounding
structures were obtained without intravenous contrast.

[Series 2: DWI · axial · 3.0mm · 0.94mm/px · z∈[-58,+85]mm · 8 of 98 slices shown (1 of 2)]
[im 1/98]
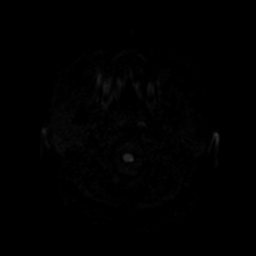
[im 14/98]
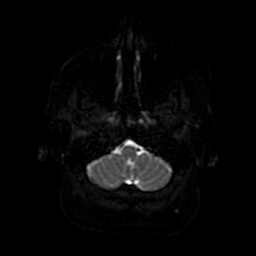
[im 28/98]
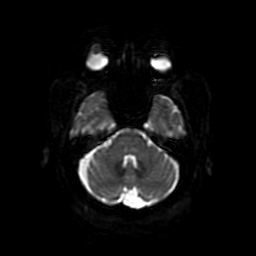
[im 42/98]
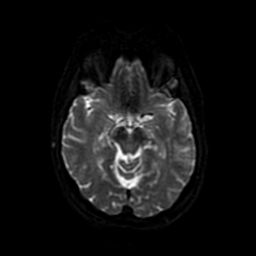
[im 56/98]
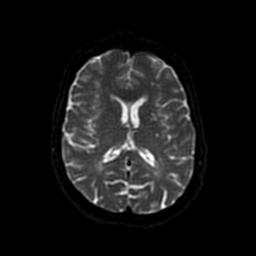
[im 70/98]
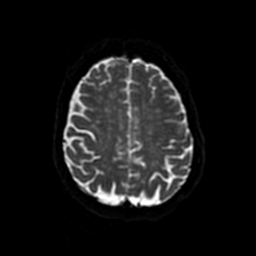
[im 84/98]
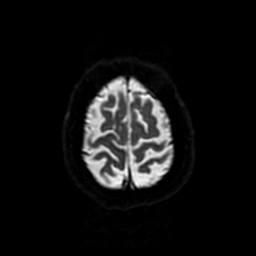
[im 98/98]
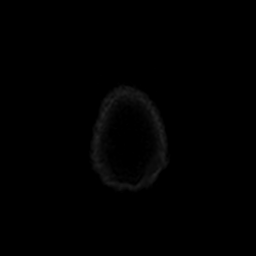

[Series 3: DWI · coronal · 4.0mm · 0.94mm/px · 5 of 66 slices shown (2 of 2)]
[im 1/66]
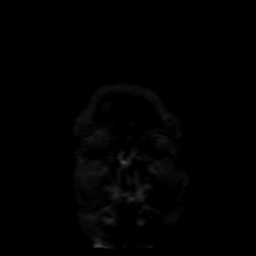
[im 17/66]
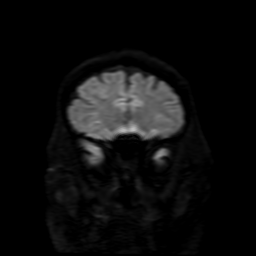
[im 33/66]
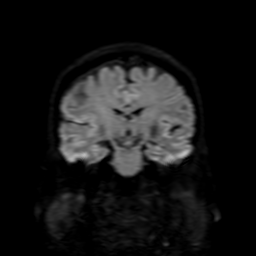
[im 49/66]
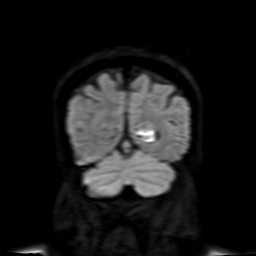
[im 66/66]
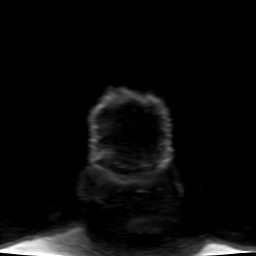

[Series 4: FLAIR · axial · 3.0mm · 0.45mm/px · z∈[-59,+85]mm · 2 of 25 slices shown (1 of 2)]
[im 1/25]
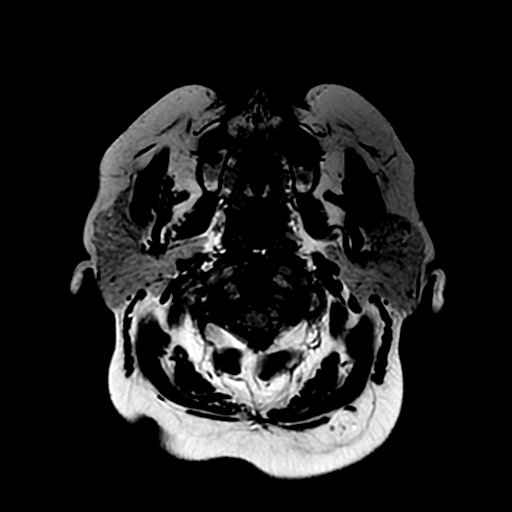
[im 25/25]
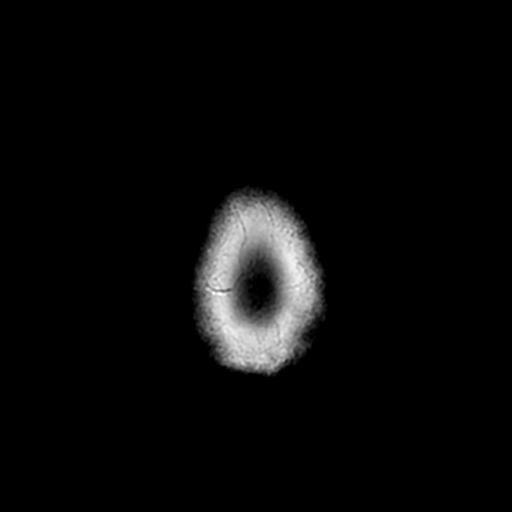

[Series 5: SWI · axial · 3.0mm · 0.47mm/px · z∈[-82,+2]mm · 5 of 100 slices shown]
[im 1/100]
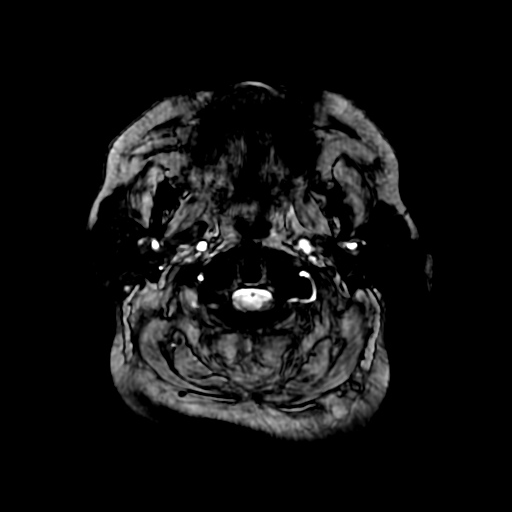
[im 15/100]
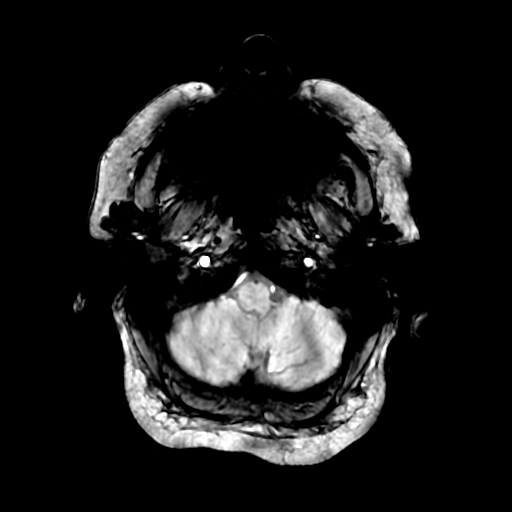
[im 29/100]
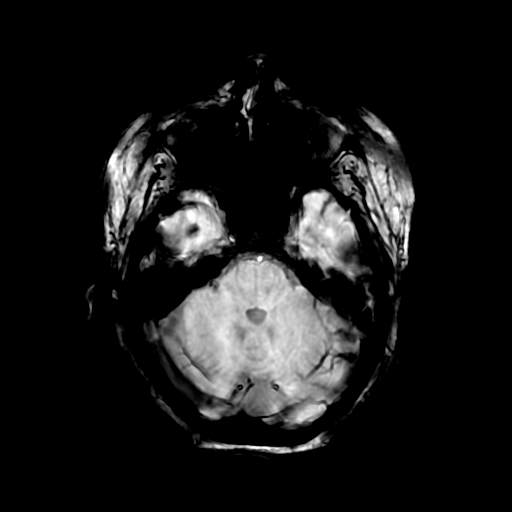
[im 43/100]
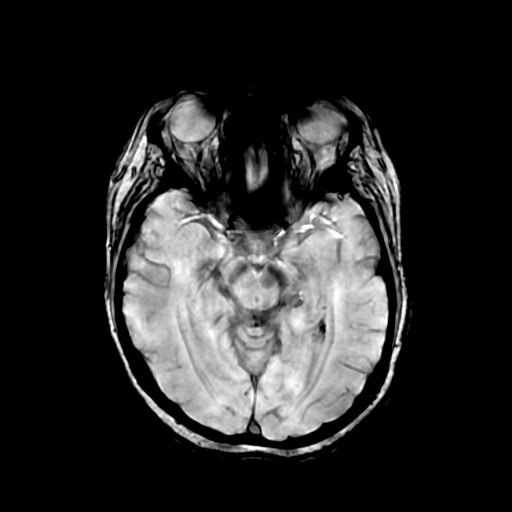
[im 57/100]
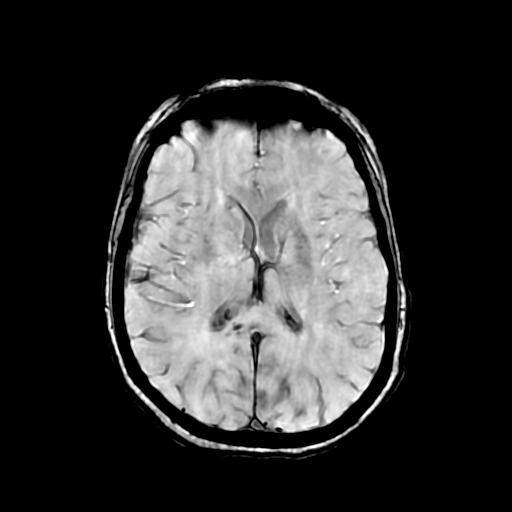

[Series 6: FLAIR · sagittal · 5.0mm · 0.47mm/px · 2 of 23 slices shown (2 of 2)]
[im 1/23]
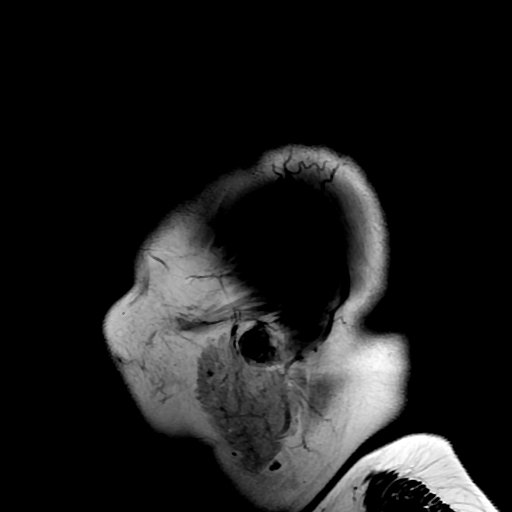
[im 23/23]
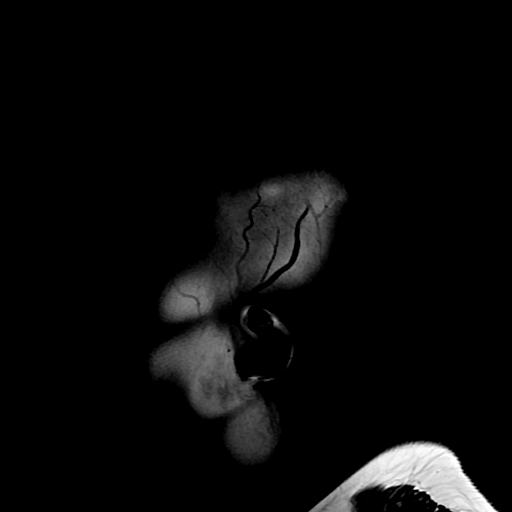

[Series 7: T2 · axial · 5.0mm · 0.47mm/px · z∈[-65,+79]mm · 2 of 25 slices shown (1 of 2)]
[im 1/25]
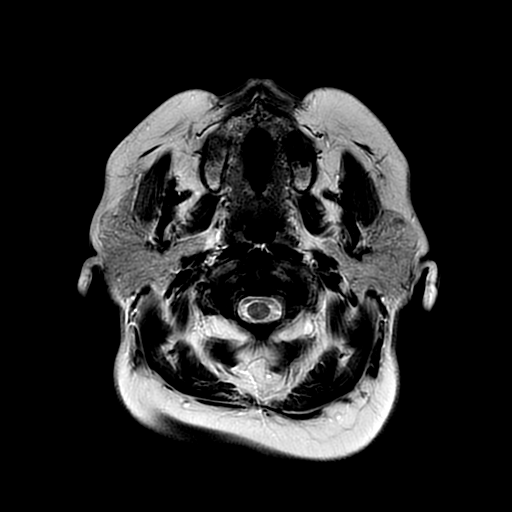
[im 25/25]
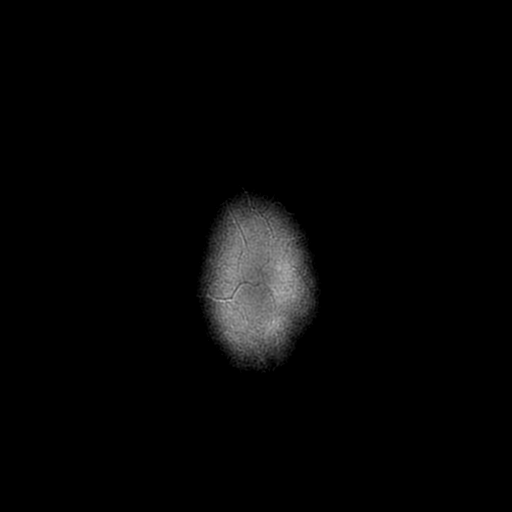

[Series 9: T2 · coronal · 5.0mm · 0.39mm/px · 2 of 28 slices shown (2 of 2)]
[im 1/28]
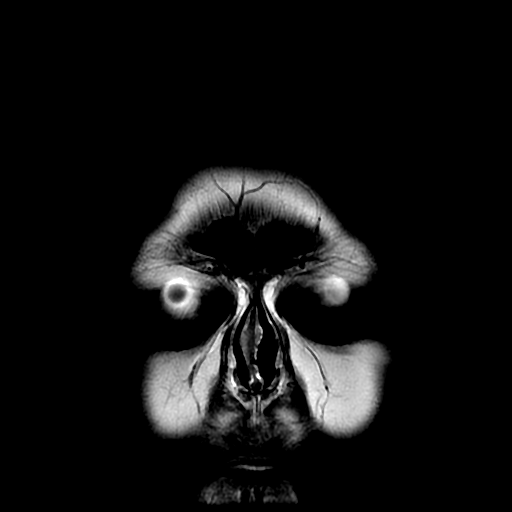
[im 28/28]
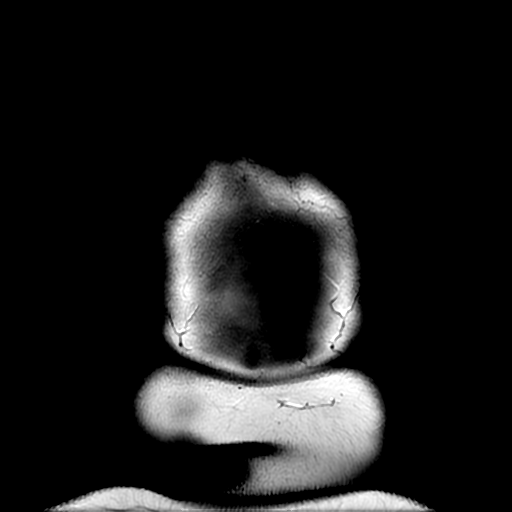

[Series 250: ADC · axial · 3.0mm · 0.94mm/px · z∈[-58,+85]mm · 4 of 48 slices shown (1 of 2)]
[im 1/48]
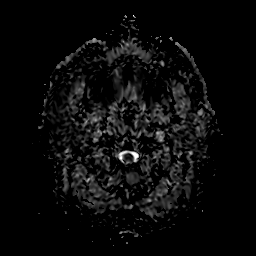
[im 16/48]
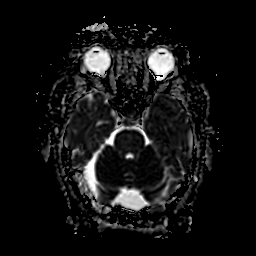
[im 32/48]
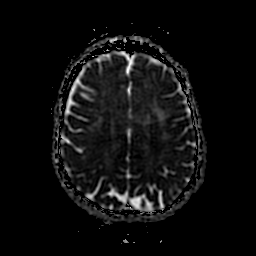
[im 48/48]
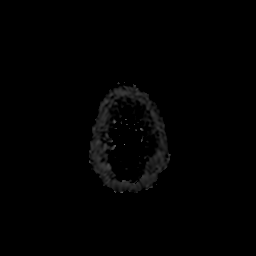

[Series 350: ADC · coronal · 4.0mm · 0.94mm/px · 3 of 33 slices shown (2 of 2)]
[im 1/33]
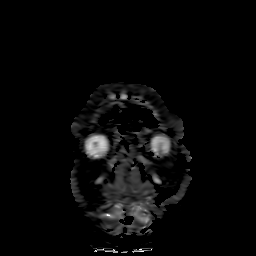
[im 17/33]
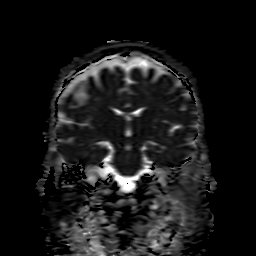
[im 33/33]
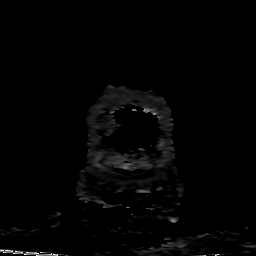

[33 of 48 positions shown; findings below may reference images not displayed]

FINDINGS: Brain: Acute/subacute infarct in the left medial occipital lobe. No
associated hemorrhage. No other acute infarct.

Scattered white matter hyperintensities bilaterally compatible
chronic ischemia, moderate in degree. Patchy hyperintensity in the
pons bilaterally. Small chronic infarct head of caudate on the
right. Chronic infarct left internal capsule and left external
capsule.

Ventricle size normal.  Negative for hemorrhage or mass.

Vascular: Normal arterial flow voids.

Skull and upper cervical spine: Negative

Sinuses/Orbits: Negative

Other: None
IMPRESSION: Acute/subacute infarct left medial occipital lobe without hemorrhage

Moderate chronic microvascular ischemic change.

## 2018-12-21 IMAGING — CT CT ANGIO NECK
2 of 7 series · 8 of 33 positions shown · IV contrast (APPLIED)
Comparison: Brain MRI earlier today.

CLINICAL DATA: 53-year-old female with vertigo, right side
hemianopia and headache. Medial left occipital lobe infarct on brain
MRI earlier today.

EXAM:
CT ANGIOGRAPHY HEAD AND NECK
TECHNIQUE: Multidetector CT imaging of the head and neck was performed using
the standard protocol during bolus administration of intravenous
contrast. Multiplanar CT image reconstructions and MIPs were
obtained to evaluate the vascular anatomy. Carotid stenosis
measurements (when applicable) are obtained utilizing NASCET
criteria, using the distal internal carotid diameter as the
denominator.
CONTRAST:  75mL OMNIPAQUE IOHEXOL 350 MG/ML SOLN

[Series 7: cta neck/head · axial · 0.57mm/px · z∈[+1156,+1266]mm · 2 of 167 slices shown]
[im 56/167  soft-tissue]
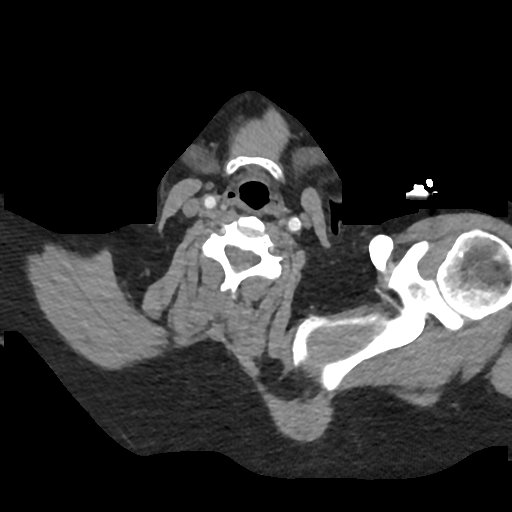
[im 111/167  soft-tissue]
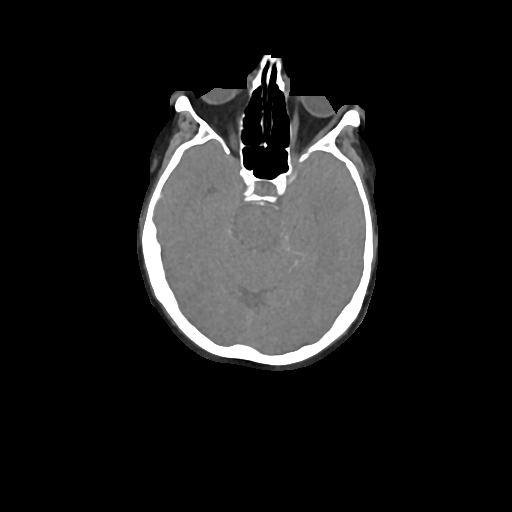

[Series 9: ax thins · axial · 0.39mm/px · z∈[+1094,+1333]mm · 6 of 335 slices shown]
[im 48/335  soft-tissue]
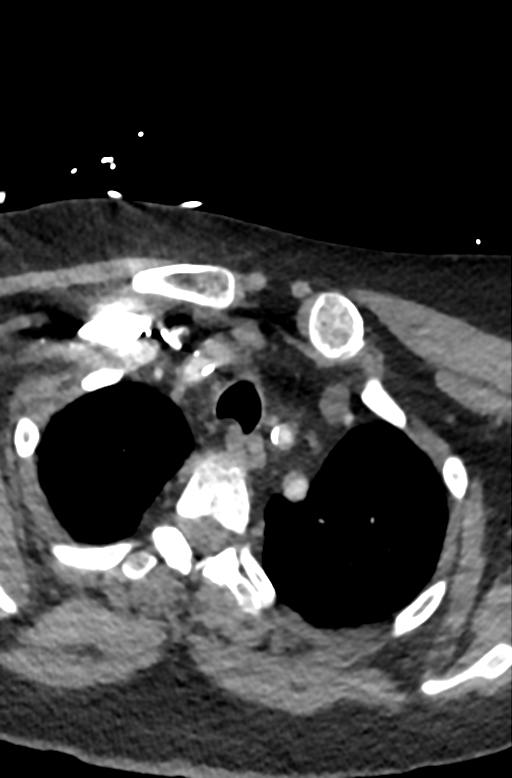
[im 96/335  bone]
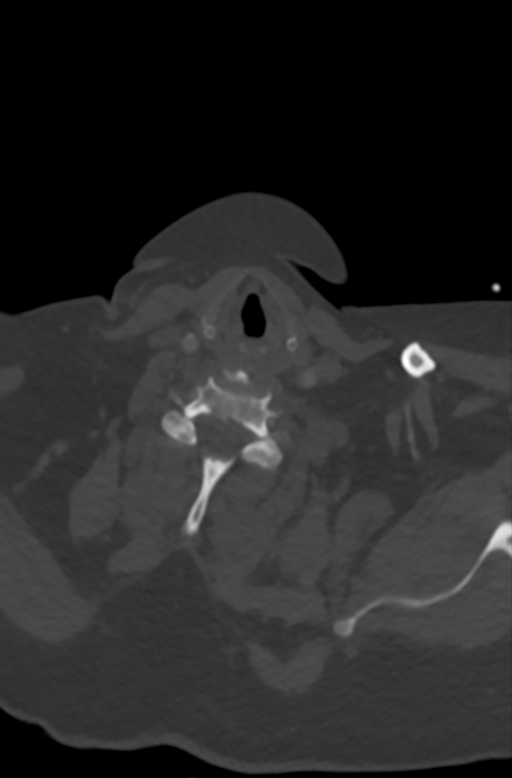
[im 144/335  soft-tissue]
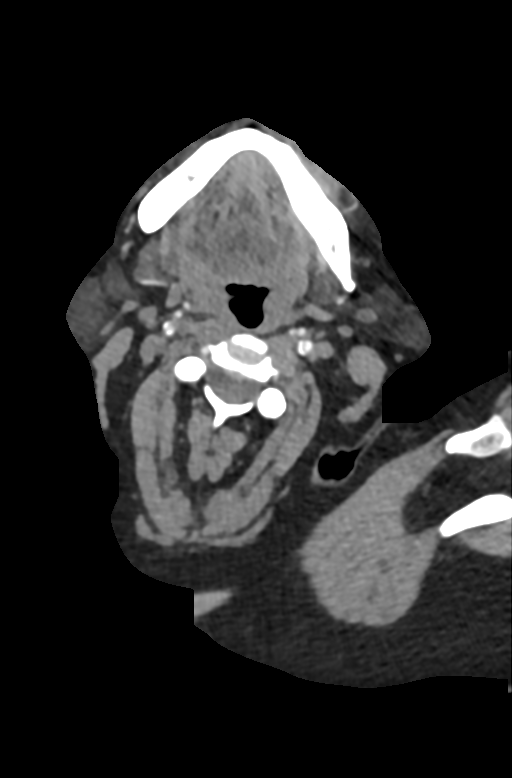
[im 191/335  bone]
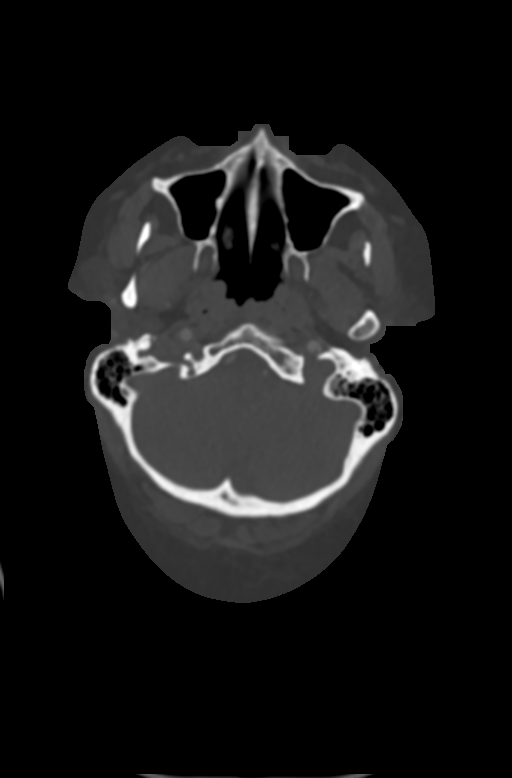
[im 239/335  soft-tissue]
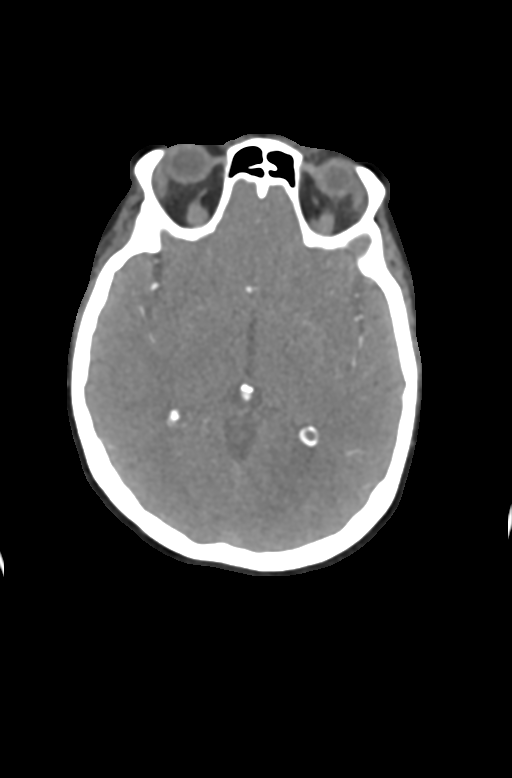
[im 287/335  bone]
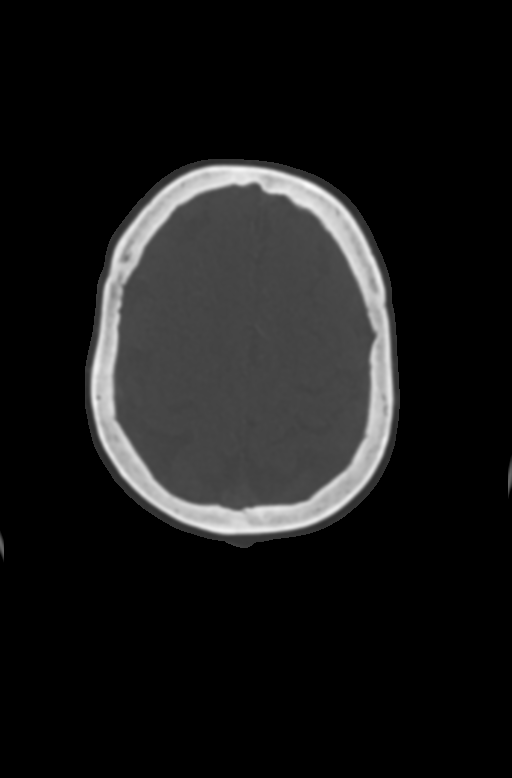

[8 of 33 positions shown; findings below may reference images not displayed]

FINDINGS: CTA NECK

Skeleton: Poor mandibular dentition. Absent maxillary dentition. No
acute osseous abnormality identified.

Upper chest: Mosaic attenuation in the upper lungs probably due to
mild gas trapping. No superior mediastinal lymphadenopathy.

Other neck: Small calcified left thyroid nodule, does not meet size
criteria for follow-up. No acute finding in the neck.

Aortic arch: Calcified aortic atherosclerosis. Three vessel arch
configuration.

Right carotid system: No brachiocephalic artery or right CCA origin
stenosis despite plaque. There appears to be bulky soft plaque along
the proximal right CCA at the level of the thyroid (series 7, image
131) without stenosis. Calcified plaque then in the more distal
right CCA and at the right carotid bifurcation. No significant
stenosis at the bifurcation, but soft and calcified plaque continues
through the bulb, and there is high-grade stenosis numerically
estimated at 70-80% just distal to the bulb on series 7, image 95
and series 11, image 68. The right ICA remains patent to the skull
base.

Left carotid system: No left CCA origin stenosis despite abundant
plaque. Medial soft plaque in the vessel at the larynx without
stenosis. Calcified plaque approaching the left carotid bifurcation.
No left ICA origin stenosis, but continued plaque in the vessel
resulting in bulb stenosis estimated at 60 % with respect to the
distal vessel (series 7, image 100). The vessel remains patent to
the skull base.

Vertebral arteries:
Proximal right subclavian plaque without significant stenosis. Right
vertebral artery origin plaque with mild stenosis. Patent right
vertebral artery to the skull base without additional stenosis.

Bulky calcified plaque at the left subclavian artery origin with
high-grade stenosis on series 9, image 304. The left subclavian
remains patent. Calcified plaque at the left vertebral artery origin
resulting in moderate stenosis on series 9, image 275. The left
vertebral artery is diminutive and irregular with intermittent V2
calcified plaque but remains patent to the skull base.

CTA HEAD

Posterior circulation: Diminutive and irregular bilateral distal
vertebral arteries. Severe stenosis of the left V4 segment. The left
vertebral is occluded beyond the left PICA origin which remains
patent. The right V4 segment is diminutive but patent without
stenosis and supplies the basilar. The basilar artery is diminutive
with mild stenosis proximal to the tip. The bilateral AICA and SCA
origins are patent, but there are fetal type bilateral PCA origins.

The right PCA is patent but diminutive and irregular. See series 14,
image 19. The left PCA is occluded at the bifurcation (P3 segment
series 14, image 25). Some of the inferior left PCA division remains
patent.

Anterior circulation: Both ICA siphons are patent but heavily
calcified. On the left there is up to moderate cavernous and
supraclinoid segment stenosis. Normal left posterior communicating
artery origin. On the right there is mild siphon stenosis.

Patent carotid termini. Patent MCA and ACA origins. The left A1 is
dominant. The left ACA A1 segment is dominant but the right A2
segment is dominant. Anterior communicating artery is within normal
limits. No definite ACA branch stenosis. Left MCA M1 and bifurcation
are patent without stenosis. Left MCA branches are within normal
limits. Right MCA M1 and bifurcation are patent without stenosis.
Right MCA branches are within normal limits.

Venous sinuses: Early contrast timing, not evaluated.

Anatomic variants: Fetal type bilateral PCA origins. Dominant left
A1 and right A2.

Review of the MIP images confirms the above findings
IMPRESSION: 1. Positive for occlusion of the distal Left PCA (P3), corresponding
to the acute ischemia seen by MRI today.
2. Positive also for widespread atherosclerosis and stenosis in the
head and neck:
- Severe Left Subclavian Artery origin stenosis.
- Severe Right ICA bulb stenosis (estimated at 70-80%).
- diminutive Left Vertebral Artery which occludes beyond the left
PICA origin, and is moderately stenotic at its origin.
- Left ICA bulb 60% stenosis.
- heavily calcified ICA siphons with moderate stenosis on the Left.
- diminutive Basilar Artery with mild stenosis.
3.  Aortic Atherosclerosis ([J7]-[J7]).
4. Carious dentition.

## 2018-12-21 IMAGING — CT CT ANGIO HEAD
2 of 7 series · 8 of 33 positions shown · IV contrast (omnipaque)
Comparison: Brain MRI earlier today.

CLINICAL DATA: 53-year-old female with vertigo, right side
hemianopia and headache. Medial left occipital lobe infarct on brain
MRI earlier today.

EXAM:
CT ANGIOGRAPHY HEAD AND NECK
TECHNIQUE: Multidetector CT imaging of the head and neck was performed using
the standard protocol during bolus administration of intravenous
contrast. Multiplanar CT image reconstructions and MIPs were
obtained to evaluate the vascular anatomy. Carotid stenosis
measurements (when applicable) are obtained utilizing NASCET
criteria, using the distal internal carotid diameter as the
denominator.
CONTRAST:  75mL OMNIPAQUE IOHEXOL 350 MG/ML SOLN

[Series 7: cta neck/head · axial · 0.57mm/px · z∈[+1156,+1266]mm · 2 of 167 slices shown]
[im 56/167  soft-tissue]
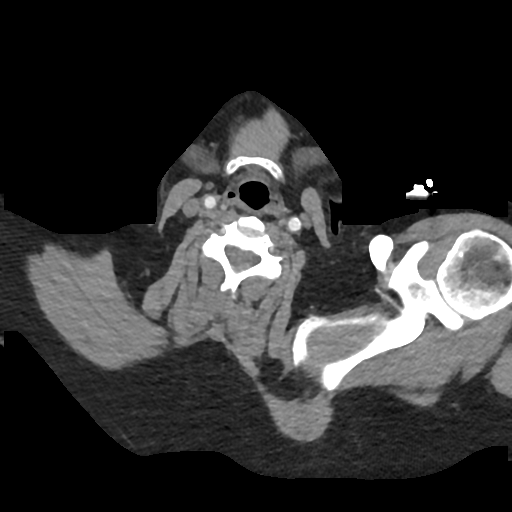
[im 111/167  soft-tissue]
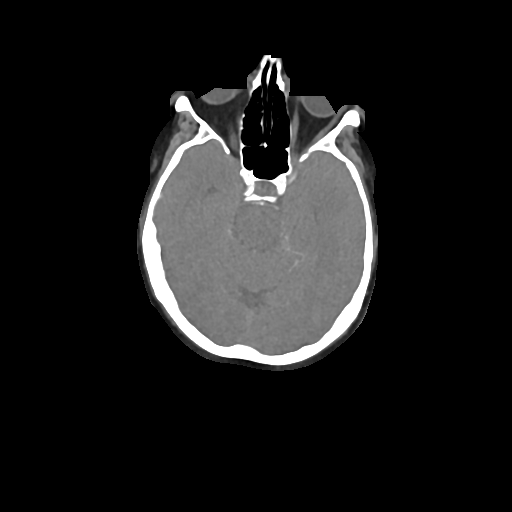

[Series 9: ax thins · axial · 0.39mm/px · z∈[+1094,+1333]mm · 6 of 335 slices shown]
[im 48/335  soft-tissue]
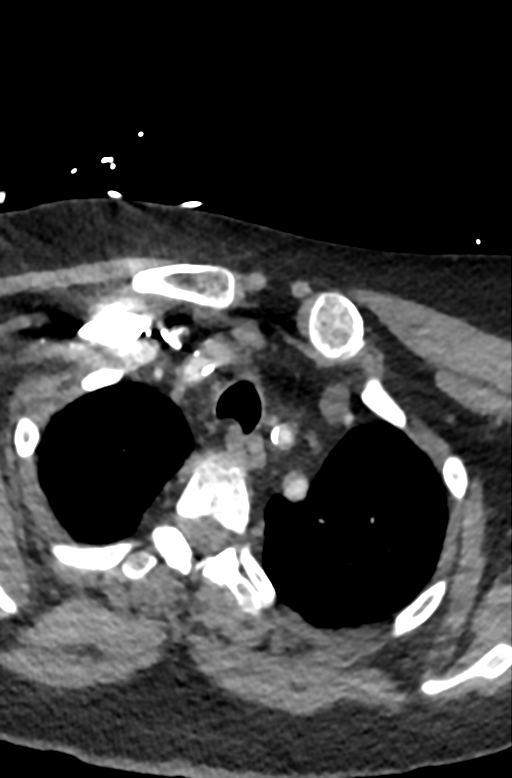
[im 96/335  bone]
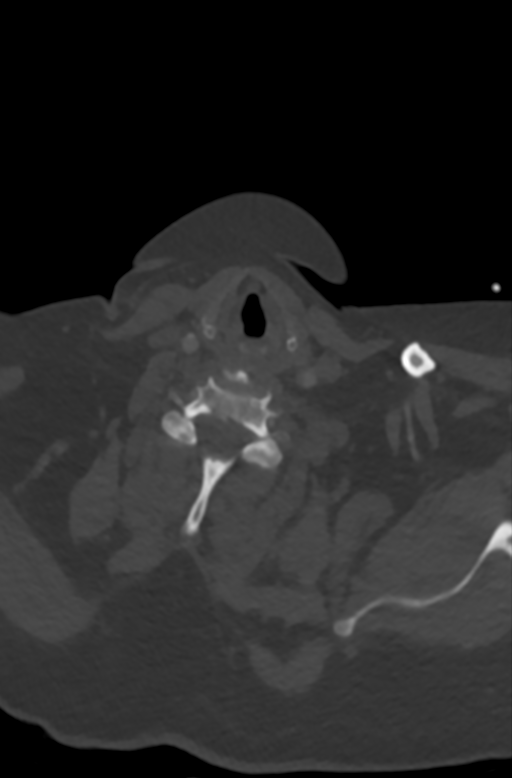
[im 144/335  soft-tissue]
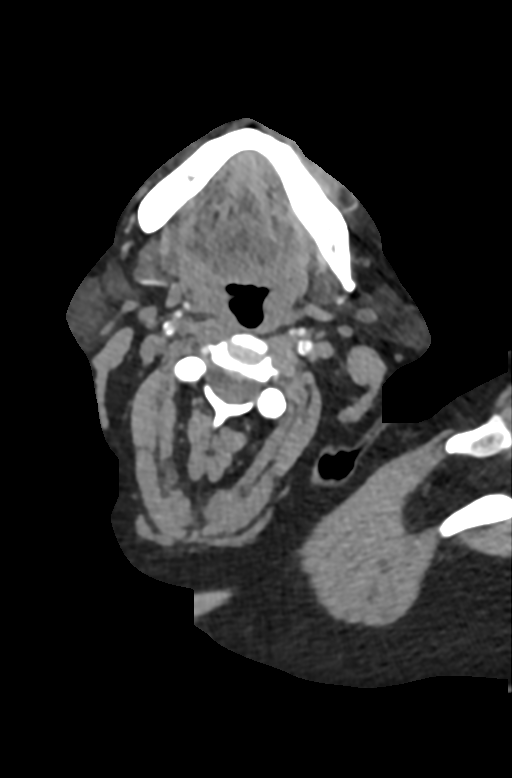
[im 191/335  bone]
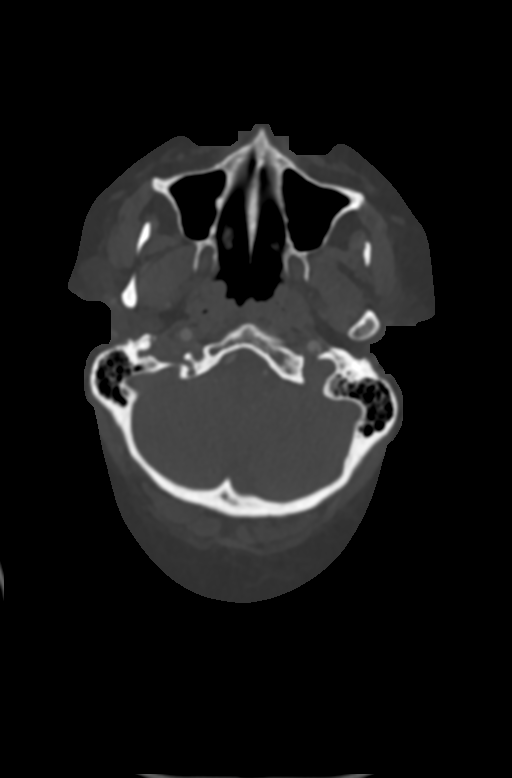
[im 239/335  soft-tissue]
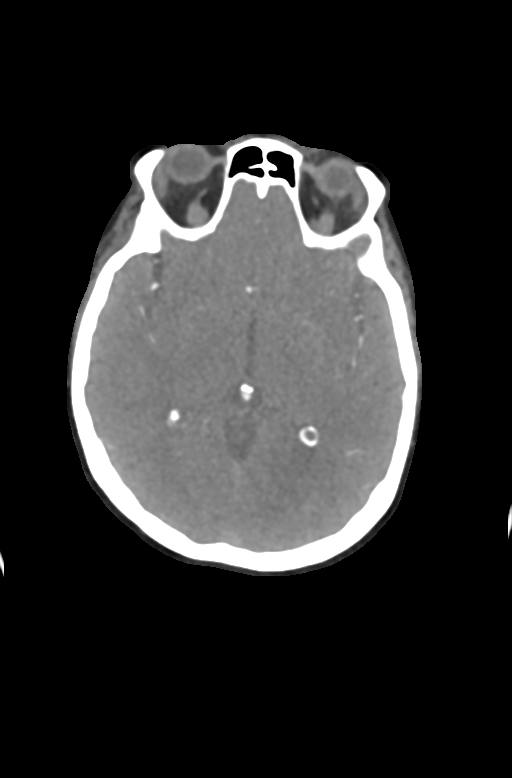
[im 287/335  bone]
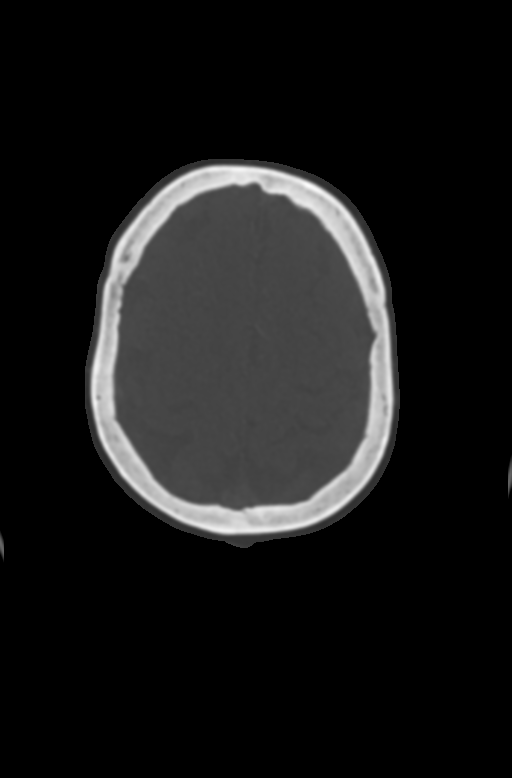

[8 of 33 positions shown; findings below may reference images not displayed]

FINDINGS: CTA NECK

Skeleton: Poor mandibular dentition. Absent maxillary dentition. No
acute osseous abnormality identified.

Upper chest: Mosaic attenuation in the upper lungs probably due to
mild gas trapping. No superior mediastinal lymphadenopathy.

Other neck: Small calcified left thyroid nodule, does not meet size
criteria for follow-up. No acute finding in the neck.

Aortic arch: Calcified aortic atherosclerosis. Three vessel arch
configuration.

Right carotid system: No brachiocephalic artery or right CCA origin
stenosis despite plaque. There appears to be bulky soft plaque along
the proximal right CCA at the level of the thyroid (series 7, image
131) without stenosis. Calcified plaque then in the more distal
right CCA and at the right carotid bifurcation. No significant
stenosis at the bifurcation, but soft and calcified plaque continues
through the bulb, and there is high-grade stenosis numerically
estimated at 70-80% just distal to the bulb on series 7, image 95
and series 11, image 68. The right ICA remains patent to the skull
base.

Left carotid system: No left CCA origin stenosis despite abundant
plaque. Medial soft plaque in the vessel at the larynx without
stenosis. Calcified plaque approaching the left carotid bifurcation.
No left ICA origin stenosis, but continued plaque in the vessel
resulting in bulb stenosis estimated at 60 % with respect to the
distal vessel (series 7, image 100). The vessel remains patent to
the skull base.

Vertebral arteries:
Proximal right subclavian plaque without significant stenosis. Right
vertebral artery origin plaque with mild stenosis. Patent right
vertebral artery to the skull base without additional stenosis.

Bulky calcified plaque at the left subclavian artery origin with
high-grade stenosis on series 9, image 304. The left subclavian
remains patent. Calcified plaque at the left vertebral artery origin
resulting in moderate stenosis on series 9, image 275. The left
vertebral artery is diminutive and irregular with intermittent V2
calcified plaque but remains patent to the skull base.

CTA HEAD

Posterior circulation: Diminutive and irregular bilateral distal
vertebral arteries. Severe stenosis of the left V4 segment. The left
vertebral is occluded beyond the left PICA origin which remains
patent. The right V4 segment is diminutive but patent without
stenosis and supplies the basilar. The basilar artery is diminutive
with mild stenosis proximal to the tip. The bilateral AICA and SCA
origins are patent, but there are fetal type bilateral PCA origins.

The right PCA is patent but diminutive and irregular. See series 14,
image 19. The left PCA is occluded at the bifurcation (P3 segment
series 14, image 25). Some of the inferior left PCA division remains
patent.

Anterior circulation: Both ICA siphons are patent but heavily
calcified. On the left there is up to moderate cavernous and
supraclinoid segment stenosis. Normal left posterior communicating
artery origin. On the right there is mild siphon stenosis.

Patent carotid termini. Patent MCA and ACA origins. The left A1 is
dominant. The left ACA A1 segment is dominant but the right A2
segment is dominant. Anterior communicating artery is within normal
limits. No definite ACA branch stenosis. Left MCA M1 and bifurcation
are patent without stenosis. Left MCA branches are within normal
limits. Right MCA M1 and bifurcation are patent without stenosis.
Right MCA branches are within normal limits.

Venous sinuses: Early contrast timing, not evaluated.

Anatomic variants: Fetal type bilateral PCA origins. Dominant left
A1 and right A2.

Review of the MIP images confirms the above findings
IMPRESSION: 1. Positive for occlusion of the distal Left PCA (P3), corresponding
to the acute ischemia seen by MRI today.
2. Positive also for widespread atherosclerosis and stenosis in the
head and neck:
- Severe Left Subclavian Artery origin stenosis.
- Severe Right ICA bulb stenosis (estimated at 70-80%).
- diminutive Left Vertebral Artery which occludes beyond the left
PICA origin, and is moderately stenotic at its origin.
- Left ICA bulb 60% stenosis.
- heavily calcified ICA siphons with moderate stenosis on the Left.
- diminutive Basilar Artery with mild stenosis.
3.  Aortic Atherosclerosis ([J7]-[J7]).
4. Carious dentition.

## 2018-12-21 MED ORDER — ATORVASTATIN CALCIUM 80 MG PO TABS
80.0000 mg | ORAL_TABLET | Freq: Every day | ORAL | Status: DC
  Administered 2018-12-21 – 2018-12-25 (×5): 80 mg via ORAL
  Filled 2018-12-21 (×5): qty 1

## 2018-12-21 MED ORDER — SODIUM CHLORIDE 0.9 % IV SOLN
INTRAVENOUS | Status: AC
  Administered 2018-12-21 – 2018-12-22 (×2): via INTRAVENOUS

## 2018-12-21 MED ORDER — OXYCODONE HCL 5 MG PO TABS
5.0000 mg | ORAL_TABLET | Freq: Four times a day (QID) | ORAL | Status: DC | PRN
  Administered 2018-12-25 (×2): 5 mg via ORAL
  Filled 2018-12-21 (×2): qty 1

## 2018-12-21 MED ORDER — IOHEXOL 350 MG/ML SOLN
75.0000 mL | Freq: Once | INTRAVENOUS | Status: AC | PRN
  Administered 2018-12-21: 75 mL via INTRAVENOUS

## 2018-12-21 MED ORDER — ASPIRIN 325 MG PO TABS
325.0000 mg | ORAL_TABLET | Freq: Every day | ORAL | Status: DC
  Administered 2018-12-21 – 2018-12-22 (×2): 325 mg via ORAL
  Filled 2018-12-21 (×2): qty 1

## 2018-12-21 NOTE — Plan of Care (Signed)
  Problem: Skin Integrity: Goal: Risk for impaired skin integrity will decrease Outcome: Completed/Met

## 2018-12-21 NOTE — Progress Notes (Addendum)
PROGRESS NOTE  Pamela Carney [id number] DOB: [birth year] DOA: 12/14/2018 PCP: Patient, No Pcp Per  HPI/Recap of past 24 hours: 53 yr old F w/ PMHx sig for nephrolithiasis and Bipolar disorder presented to [facility] w/ abdominal pain and emesis found to have a 11 mm obtsructng stone.Septic shock. POD 1 Cystoscopy, right retrograde ureteropyelogram, fluoroscopic interpretation, right double-J stent placement. 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 Per EDP documentation the patient stated that these symptoms began three days ago and have become progressively worse. She also reported subjective fevers, and a non productive cough.   POD 1 Cystoscopy, right retrograde ureteropyelogram, fluoroscopic interpretation, right double-J stent placement. Seen Overnight laying in bed. Ambulates to rest room. Only required severe pain meds once overnight post void. Tolerated diet. Off of Levophed gtt for several hours and remains hemodynamically stable.   Transferred to [facility] service on 12/17/2018.   Vaginal candidiasis treated on 12/18/18 with 1 dose p.o. fluconazole 200 mg once.   Reports ureteral spasm and discomfort of lower abdomen on 12/19/2018, started on Pyridium.   Hospital course complicated by persistent right eye discomfort which prompted ophthalmology consult.  Recommendation for MRI brain.  Completed on 12/21/2018, showed acute/subacute ischemic CVA involving left medial occipital lobe.  Stroke neurology paged.  Will initiate stroke work-up.  12/21/18: Patient was seen and examined this morning.  She reports constantly seeing rings on her right eye.  MRI brain completed with results as stated above.  Informed  ophthalmology Dr. Talbert Forest.  Consulted stroke neuro, Dr. Leonie Man.  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 Stroke work-up: LDL 99 with goal less than 70 A1c 8.6 with goal less than 7.0 2D echo done on 12/15/2018 showed normal LVEF with no evidence of PFO Start low-dose aspirin and Lipitor 80 mg daily Obtain twelve-lead EKG Last twelve-lead EKG done on 12/15/2018 in sinus tachycardia 102 Avoid hypotension Start normal saline at 50 cc/h x 1 day Seen by PT OT with no further PT OT recommendations Speech consult  B/L carotid doppler US  Resolving sepsis with recent septic shock secondary to complicated Enterococcus faecalis/E. coli UTI in the setting of right distal ureteral calculus with pyelonephrosis and E. coli bacteremia Off pressors Urine culture taken on 12/15/2018 grew 20,000 colonies of Enterococcus faecalis, 2000 colonies of E. coli Resistance to ampicillin and ciprofloxacin intermediate resistance to ampicillin/sulbactam Completed 5 days of Rocephin Switch to Keflex 500 mg 3 times daily on 12/20/18 x5 days  Right homonymous hemianopsia /right eye discomfort secondary to acute/subacute ischemic CVA involving left medial occipital lobe States she has been seeing rings with her right eye and is causing dizziness Seen by ophthalmology Dr. Talbert Forest who recommended brain MRI. Informed Dr. Talbert Forest of new findings; follow-up with ophthalmology Dr. Talbert Forest outpatient.  E. coli bacteremia, poa Management for stated above Repeated blood cultures x2 done on 12/17/2018- to date This morning well to IV antibiotics We will complete total of 10 days of antibiotics  UTI with LUTS, POA Management as stated above  Ureteral/bladder  spasm  Continue Pyridium 100 mg 3 times daily x3 days Start oxybutynin and continue until seen at the neurology clinic Obtain KUB to assure proper placement of stent  Vaginal candidiasis Ordered 1 dose of fluconazole 200 mg once  Resolving prerenal AKI with IV fluid hydration Appears to be at her baseline creatinine 1.1 with GFR of 52 52 with creatinine of 2.45 and GFR 22 DC IV fluid Continue to avoid nephrotoxins such as NSAIDs Good urine output greater than 3.9 L recorded in the last 24 hours  E. coli bacteremia likely from urinary source Blood cultures drawn on 12/15/2018 1 out of 2 bottles grew E. coli Resistant to ampicillin, ampicillin/sulbactam, ciprofloxacin Completed 5 days of Rocephin Repeat blood culture x2 peripherally on 12/17/18 no growth to date Remove central line right IJ on 12/19/2018   Right distal ureteral calculus with pyonephrosis status post right cystoscopy and stent with urology on 12/15/2018 Continue to optimize pain control Reports persistent spasm; will contact urology.  Type 2 diabetes with hyperglycemia Blood sugar is better controlled Hemoglobin A1c 8.6 on 12/15/2018 Continue insulin coverage Continue to hold off oral anti-glycemic's  Physical debility: Improving PT OT had no further recommendations Continue to mobilize Fall precautions  History of type I bipolar disorder Currently not on any medications.  Pharmacy review  Moderate aorto bi-iliac atherosclerotic disease Seen on CT study Lipid panel: LDL 99 Chol 183 TG 288 HDL 26 P Given that pt is >40 yrs of age has a h/o DM Started Atorvastatin 40 mg to be continued on discharge  Severe morbid obesity BMI 40 Recommend weight loss outpatient with regular physical activity and healthy dieting   Code Status: Full code  Family Communication: None at bedside  Disposition Plan: Possible discharge to home tomorrow 12/20/2018.   Consultants:  PCCM  Urology    Antimicrobials:   Rocephin  DVT prophylaxis: Subcu heparin 3 times daily   Objective: Vitals:   12/20/18 2014 12/20/18 2214 12/21/18 0534 12/21/18 1430  BP: (!) 76/49 (!) 86/52 120/74 (!) 87/63  Pulse:   65 79  Resp:   18 19  Temp:   97.7 F (36.5 C) 98.1 F (36.7 C)  TempSrc:   Oral Oral  SpO2:   96% 96%  Weight:   97.3 kg   Height:        Intake/Output Summary (Last 24 hours) at 12/21/2018 1446 Last data filed at 12/21/2018 0536 Gross per 24 hour  Intake 2523.5 ml  Output -  Net 2523.5 ml   Filed Weights   12/19/18 0520 12/20/18 0531 12/21/18 0534  Weight: 98.7 kg 97.2 kg 97.3 kg    Exam:  . General: 53 y.o. year-old female appears in no acute distress.  Persistent right hemianopsia. . Cardiovascular: Rate and rhythm no rubs or gallops no JVD or thyromegaly.   Marland Kitchen Respiratory: Clear to auscultation no wheezes no rales. Poor inspiratory effort.   Abdomen: Obese nontender nondistended bowel sounds present.  Musculoskeletal: 2 out of 4 pulses in all 4 extremities. Psychiatry: Mood is appropriate for condition and setting.   Data Reviewed: CBC: Recent Labs  Lab 12/14/18 2058 12/15/18 0348 12/15/18 0406 12/15/18 0505 12/15/18 0707 12/15/18 0757 12/16/18 0541 12/16/18 0741  WBC 12.5* 8.6  --   --   --  14.1* 9.3  --   NEUTROABS  --   --   --   --   --  12.4*  --   --   HGB 14.4 12.5 12.2 11.6* 11.6* 11.8*  10.2*  --   HCT 42.2 39.3 36.0 34.0* 34.0* 34.6* 32.1* 28.8*  MCV 97.0 100.3*  --   --   --  95.8 100.6*  --   PLT 221 187  --   --   --  207 175  --    Basic Metabolic Panel: Recent Labs  Lab 12/15/18 0600 12/15/18 0707 12/15/18 2154 12/16/18 0541 12/18/18 0549 12/19/18 0500 12/20/18 0402  NA 133* 134*  --  137 134* 136 138  K 3.8 4.0  --  4.1 3.5 3.5 4.1  CL 99  --   --  102 97* 98 102  CO2 22  --   --  25 28 31 26   GLUCOSE 198*  --   --  181* 139* 138* 126*  BUN 34*  --   --  36* 24* 15 20  CREATININE 1.87*  --   --  1.51* 1.38* 1.19* 1.27*  CALCIUM 7.8*  --    --  8.9 8.8* 8.8* 9.0  MG  --   --  2.1  --   --   --   --   PHOS  --   --  3.7  --   --   --   --    GFR: Estimated Creatinine Clearance: 56.4 mL/min (A) (by C-G formula based on SCr of 1.27 mg/dL (H)). Liver Function Tests: Recent Labs  Lab 12/14/18 2058  AST 32  ALT 22  ALKPHOS 109  BILITOT 1.1  PROT 7.1  ALBUMIN 2.6*   Recent Labs  Lab 12/14/18 2058  LIPASE 25   No results for input(s): AMMONIA in the last 168 hours. Coagulation Profile: Recent Labs  Lab 12/15/18 0348  INR 1.1   Cardiac Enzymes: No results for input(s): CKTOTAL, CKMB, CKMBINDEX, TROPONINI in the last 168 hours. BNP (last 3 results) No results for input(s): PROBNP in the last 8760 hours. HbA1C: No results for input(s): HGBA1C in the last 72 hours. CBG: Recent Labs  Lab 12/20/18 1147 12/20/18 1701 12/20/18 2043 12/21/18 0802 12/21/18 1239  GLUCAP 176* 130* 259* 133* 147*   Lipid Profile: No results for input(s): CHOL, HDL, LDLCALC, TRIG, CHOLHDL, LDLDIRECT in the last 72 hours. Thyroid Function Tests: No results for input(s): TSH, T4TOTAL, FREET4, T3FREE, THYROIDAB in the last 72 hours. Anemia Panel: No results for input(s): VITAMINB12, FOLATE, FERRITIN, TIBC, IRON, RETICCTPCT in the last 72 hours. Urine analysis:    Component Value Date/Time   COLORURINE YELLOW 12/14/2018 2030   APPEARANCEUR CLOUDY (A) 12/14/2018 2030   LABSPEC 1.016 12/14/2018 2030   PHURINE 6.0 12/14/2018 2030   GLUCOSEU NEGATIVE 12/14/2018 2030   HGBUR MODERATE (A) 12/14/2018 2030   [place] NEGATIVE 12/14/2018 2030   [place] 12/14/2018 2030   PROTEINUR 100 (A) 12/14/2018 2030   UROBILINOGEN 0.2 03/31/2010 0246   NITRITE NEGATIVE 12/14/2018 2030   LEUKOCYTESUR LARGE (A) 12/14/2018 2030   Sepsis Labs: @LABRCNTIP (procalcitonin:4,lacticidven:4)  ) Recent Results (from the past 240 hour(s))  Blood Culture (routine x 2)     Status: Abnormal   Collection Time: 12/15/18 12:16 AM   Specimen: BLOOD   Result Value Ref Range Status   Specimen Description BLOOD RIGHT FOREARM  Final   Special Requests   Final    BOTTLES DRAWN AEROBIC AND ANAEROBIC Blood Culture adequate volume   Culture  Setup Time   Final    GRAM NEGATIVE RODS IN BOTH AEROBIC AND ANAEROBIC BOTTLES CRITICAL RESULT CALLED TO, READ BACK BY AND VERIFIED WITH:  Tillman Sers [facility] [id number] 12/15/18 A BROWNING Performed at [facility], [place] [address], [place], [place] [address]    Culture ESCHERICHIA COLI (A)  Final   Report Status 12/17/2018 FINAL  Final   Organism ID, Bacteria ESCHERICHIA COLI  Final      Susceptibility   Escherichia coli - MIC*    AMPICILLIN >=32 RESISTANT Resistant     CEFAZOLIN 16 SENSITIVE Sensitive     CEFEPIME <=1 SENSITIVE Sensitive     CEFTAZIDIME <=1 SENSITIVE Sensitive     CEFTRIAXONE <=1 SENSITIVE Sensitive     CIPROFLOXACIN >=4 RESISTANT Resistant     GENTAMICIN <=1 SENSITIVE Sensitive     IMIPENEM <=0.25 SENSITIVE Sensitive     TRIMETH/SULFA <=20 SENSITIVE Sensitive     AMPICILLIN/SULBACTAM >=32 RESISTANT Resistant     PIP/TAZO <=4 SENSITIVE Sensitive     Extended ESBL NEGATIVE Sensitive     * ESCHERICHIA COLI  Blood Culture ID Panel (Reflexed)     Status: Abnormal   Collection Time: 12/15/18 12:16 AM  Result Value Ref Range Status   Enterococcus species NOT DETECTED NOT DETECTED Final   Listeria monocytogenes NOT DETECTED NOT DETECTED Final   Staphylococcus species NOT DETECTED NOT DETECTED Final   Staphylococcus aureus (BCID) NOT DETECTED NOT DETECTED Final   Streptococcus species NOT DETECTED NOT DETECTED Final   Streptococcus agalactiae NOT DETECTED NOT DETECTED Final   Streptococcus pneumoniae NOT DETECTED NOT DETECTED Final   Streptococcus pyogenes NOT DETECTED NOT DETECTED Final   Acinetobacter baumannii NOT DETECTED NOT DETECTED Final   Enterobacteriaceae species DETECTED (A) NOT DETECTED Final    Comment: Enterobacteriaceae represent a large family of gram-negative bacteria,  not a single organism. CRITICAL RESULT CALLED TO, READ BACK BY AND VERIFIED WITH: Tillman Sers PHARMD [id number] 12/15/18 A BROWNING    Enterobacter cloacae complex NOT DETECTED NOT DETECTED Final   Escherichia coli DETECTED (A) NOT DETECTED Final    Comment: CRITICAL RESULT CALLED TO, READ BACK BY AND VERIFIED WITH: Tillman Sers PHARMD [id number] 12/15/18 A BROWNING    Klebsiella oxytoca NOT DETECTED NOT DETECTED Final   Klebsiella pneumoniae NOT DETECTED NOT DETECTED Final   Proteus species NOT DETECTED NOT DETECTED Final   Serratia marcescens NOT DETECTED NOT DETECTED Final   Carbapenem resistance NOT DETECTED NOT DETECTED Final   Haemophilus influenzae NOT DETECTED NOT DETECTED Final   Neisseria meningitidis NOT DETECTED NOT DETECTED Final   Pseudomonas aeruginosa NOT DETECTED NOT DETECTED Final   Candida albicans NOT DETECTED NOT DETECTED Final   Candida glabrata NOT DETECTED NOT DETECTED Final   Candida krusei NOT DETECTED NOT DETECTED Final   Candida parapsilosis NOT DETECTED NOT DETECTED Final   Candida tropicalis NOT DETECTED NOT DETECTED Final    Comment: Performed at [facility], [place] [address] [place], [place] [address]  SARS Coronavirus 2 [facility] order, Performed in [facility] hospital lab) Nasopharyngeal Nasopharyngeal Swab     Status: None   Collection Time: 12/15/18 12:19 AM   Specimen: Nasopharyngeal Swab  Result Value Ref Range Status   SARS Coronavirus 2 NEGATIVE NEGATIVE Final    Comment: (NOTE) If result is NEGATIVE SARS-CoV-2 target nucleic acids are NOT DETECTED. The SARS-CoV-2 RNA is generally detectable in upper and lower  respiratory specimens during the acute phase of infection. The lowest  concentration of SARS-CoV-2 viral copies this assay can detect is 250  copies / mL. A negative result does not preclude SARS-CoV-2 infection  and should not be used  as the sole basis for treatment or other  patient management decisions.  A negative result may occur with   improper specimen collection / handling, submission of specimen other  than nasopharyngeal swab, presence of viral mutation(s) within the  areas targeted by this assay, and inadequate number of viral copies  (<250 copies / mL). A negative result must be combined with clinical  observations, patient history, and epidemiological information. If result is POSITIVE SARS-CoV-2 target nucleic acids are DETECTED. The SARS-CoV-2 RNA is generally detectable in upper and lower  respiratory specimens dur ing the acute phase of infection.  Positive  results are indicative of active infection with SARS-CoV-2.  Clinical  correlation with patient history and other diagnostic information is  necessary to determine patient infection status.  Positive results do  not rule out bacterial infection or co-infection with other viruses. If result is PRESUMPTIVE POSTIVE SARS-CoV-2 nucleic acids MAY BE PRESENT.   A presumptive positive result was obtained on the submitted specimen  and confirmed on repeat testing.  While 2019 novel coronavirus  (SARS-CoV-2) nucleic acids may be present in the submitted sample  additional confirmatory testing may be necessary for epidemiological  and / or clinical management purposes  to differentiate between  SARS-CoV-2 and other Sarbecovirus currently known to infect humans.  If clinically indicated additional testing with an alternate test  methodology [phone]) is advised. The SARS-CoV-2 RNA is generally  detectable in upper and lower respiratory sp ecimens during the acute  phase of infection. The expected result is Negative. Fact Sheet for Patients:  StrictlyIdeas.no Fact Sheet for Healthcare Providers: BankingDealers.co.za This test is not yet approved or cleared by the [place] FDA and has been authorized for detection and/or diagnosis of SARS-CoV-2 by FDA under an Emergency Use Authorization (EUA).  This EUA will  remain in effect (meaning this test can be used) for the duration of the COVID-19 declaration under Section 564(b)(1) of the Act, 21 U.S.C. section 360bbb-3(b)(1), unless the authorization is terminated or revoked sooner. Performed at [facility], [place] [address], [place], [place] [address]   Blood Culture (routine x 2)     Status: None   Collection Time: 12/15/18  2:26 AM   Specimen: BLOOD  Result Value Ref Range Status   Specimen Description BLOOD RIGHT ARM  Final   Special Requests   Final    BOTTLES DRAWN AEROBIC AND ANAEROBIC Blood Culture adequate volume   Culture   Final    NO GROWTH 5 DAYS Performed at [facility], [place] [address], [place], [place] [address]    Report Status 12/20/2018 FINAL  Final  MRSA PCR Screening     Status: None   Collection Time: 12/15/18  6:35 AM   Specimen: Nasopharyngeal  Result Value Ref Range Status   MRSA by PCR NEGATIVE NEGATIVE Final    Comment:        The GeneXpert MRSA Assay (FDA approved for NASAL specimens only), is one component of a comprehensive MRSA colonization surveillance program. It is not intended to diagnose MRSA infection nor to guide or monitor treatment for MRSA infections. Performed at [facility], [place] [address], [place], [place] [address]   Urine culture     Status: Abnormal   Collection Time: 12/15/18  8:55 AM   Specimen: In/Out Cath Urine  Result Value Ref Range Status   Specimen Description IN/OUT CATH URINE  Final   Special Requests   Final    NONE Performed  at [facility], [place] [address] [place], [place] [address]    Culture (A)  Final    20,000 COLONIES/mL ENTEROCOCCUS FAECALIS 2,000 COLONIES/mL ESCHERICHIA COLI    Report Status 12/17/2018 FINAL  Final   Organism ID, Bacteria ENTEROCOCCUS FAECALIS (A)  Final   Organism ID, Bacteria ESCHERICHIA COLI (A)  Final      Susceptibility   Escherichia coli - MIC*    AMPICILLIN >=32 RESISTANT Resistant     CEFAZOLIN <=4 SENSITIVE  Sensitive     CEFTRIAXONE <=1 SENSITIVE Sensitive     CIPROFLOXACIN >=4 RESISTANT Resistant     GENTAMICIN <=1 SENSITIVE Sensitive     IMIPENEM <=0.25 SENSITIVE Sensitive     NITROFURANTOIN <=16 SENSITIVE Sensitive     TRIMETH/SULFA <=20 SENSITIVE Sensitive     AMPICILLIN/SULBACTAM 16 INTERMEDIATE Intermediate     PIP/TAZO <=4 SENSITIVE Sensitive     Extended ESBL NEGATIVE Sensitive     * 2,000 COLONIES/mL ESCHERICHIA COLI   Enterococcus faecalis - MIC*    AMPICILLIN <=2 SENSITIVE Sensitive     LEVOFLOXACIN 2 SENSITIVE Sensitive     NITROFURANTOIN <=16 SENSITIVE Sensitive     VANCOMYCIN 1 SENSITIVE Sensitive     * 20,000 COLONIES/mL ENTEROCOCCUS FAECALIS  Culture, blood (routine x 2)     Status: None (Preliminary result)   Collection Time: 12/17/18 10:13 AM   Specimen: BLOOD RIGHT ARM  Result Value Ref Range Status   Specimen Description BLOOD RIGHT ARM  Final   Special Requests   Final    AEROBIC BOTTLE ONLY Blood Culture results may not be optimal due to an inadequate volume of blood received in culture bottles   Culture   Final    NO GROWTH 4 DAYS Performed at [facility], [address], [place], [place] [address]    Report Status PENDING  Incomplete  Culture, blood (routine x 2)     Status: None (Preliminary result)   Collection Time: 12/17/18 10:18 AM   Specimen: BLOOD LEFT ARM  Result Value Ref Range Status   Specimen Description BLOOD LEFT ARM  Final   Special Requests AEROBIC BOTTLE ONLY Blood Culture adequate volume  Final   Culture   Final    NO GROWTH 4 DAYS Performed at [facility], [place] [address], [place], [place] [address]    Report Status PENDING  Incomplete      Studies: Dg Abd 1 View  Result Date: 12/20/2018 CLINICAL DATA:  Right flank pain. Recent ureteral stent placement. Urolithiasis. EXAM: ABDOMEN - 1 VIEW COMPARISON:  None. FINDINGS: Double pigtail right ureteral stent is seen in expected position. No definite radiopaque calculi are  seen along the course of the stent. Several radiodensities are seen in the expected region of left kidney, consistent with small left renal calculi. IUD noted in the pelvis. Bowel gas pattern is normal. IMPRESSION: Right ureteral stent in appropriate position. No definite radiopaque ureteral calculi identified. Left nephrolithiasis. Electronically Signed   By: Marlaine Hind M.D.   On: 12/20/2018 15:35   Mr Brain Wo Contrast  Result Date: 12/21/2018 CLINICAL DATA:  Vertigo.  Right-sided hemianopia.  Headache. EXAM: MRI HEAD WITHOUT CONTRAST TECHNIQUE: Multiplanar, multiecho pulse sequences of the brain and surrounding structures were obtained without intravenous contrast. COMPARISON:  CT head 08/19/2007 FINDINGS: Brain: Acute/subacute infarct in the left medial occipital lobe. No associated hemorrhage. No other acute infarct. Scattered white matter hyperintensities bilaterally compatible chronic ischemia, moderate in degree. Patchy hyperintensity in the  pons bilaterally. Small chronic infarct head of caudate on the right. Chronic infarct left internal capsule and left external capsule. Ventricle size normal.  Negative for hemorrhage or mass. Vascular: Normal arterial flow voids. Skull and upper cervical spine: Negative Sinuses/Orbits: Negative Other: None IMPRESSION: Acute/subacute infarct left medial occipital lobe without hemorrhage Moderate chronic microvascular ischemic change. Electronically Signed   By: Franchot Gallo M.D.   On: 12/21/2018 12:25    Scheduled Meds: . atorvastatin  40 mg Oral q1800  . cefdinir  300 mg Oral Q12H  . Chlorhexidine Gluconate Cloth  6 each Topical Daily  . heparin  5,000 Units Subcutaneous Q8H  . insulin aspart  0-5 Units Subcutaneous QHS  . insulin aspart  0-9 Units Subcutaneous TID WC  . insulin glargine  15 Units Subcutaneous QHS  . mouth rinse  15 mL Mouth Rinse BID  . opium-belladonna  1 suppository Rectal Once  . oxybutynin  2.5 mg Oral TID  . polyethylene  glycol  17 g Oral Daily  . senna-docusate  2 tablet Oral BID  . sodium chloride flush  3 mL Intravenous Once    Continuous Infusions: . sodium chloride 250 mL (12/15/18 1816)  . sodium chloride       LOS: 6 days     Kayleen Memos, MD [facility] Pager [phone]  If 7PM-7AM, please contact night-coverage [email or web address] Password [email or web address] 12/21/2018, 2:46 PM

## 2018-12-21 NOTE — Consult Note (Addendum)
Neurology Consultation  Reason for Consult: Stroke Referring Physician: Dr. Nevada Crane  CC: Vision abnormalities on the right aspect of her vision  History is obtained from: Patient  HPI: Pamela Carney is a 53 y.o. female with multiple medical problems.  For stroke risk factors patient does have history of hypertension and diabetes.  Patient was admitted to the hospital secondary to nephrolithiasis including staghorn calculi that required a nephrostomy tubes to be placed.  Per patient she had been complaining about the right eye aspect for quite some time while in the hospital.  Eventually they did call in a ophthalmologist which found what is noted below.  At that time MRI was ordered.  MRI did confirm a acute/subacute infarct in the right medial occipital lobe.  Patient states that she has no other symptoms then seeing "kaleidoscope-like colors in her left visual field that is present with eyes both open and closed.  She denies any numbness, weakness, diplopia, dysphasia.  She also denies any eye pain, retro-orbital pressure and or pain with movement of her eyes.   Work up that has been done: Patient was seen by a ophthalmologist on 9/29.  At that time she was found to have a 20/25 vision in both eyes.  On confrontation the ophthalmologist found that the findings are consistent with a right homonymous hemianopsia.  She at that time cannot see past midline on the right side.  On dilated exam the retina was flat and intact in both eyes with no diabetic retinopathy seen.  At that time it was recommended to obtain MRI to look for possible stroke.   LKW: Patient states that the first known symptoms were approximately 1 week ago tpa given?: no, no known last known normal Premorbid modified Rankin scale (mRS): 0 NIH stroke score is 0   ROS: A 14 point ROS was performed and is negative except as noted in the HPI.   Past Medical History:  Diagnosis Date  . Abnormal Pap smear   . AKI (acute kidney  injury) ([place]) 12/28/2017  . Anxiety   . Arthritis    bil knees, neck  . Bipolar 1 disorder ([place])   . Cataract    Mild  . Cholelithiasis 12/23/2017   noted on CT renal, pt unaware  . Chronic kidney disease (CKD), stage III (moderate) ([facility])   . Cystitis   . Diabetes mellitus without complication ([place])   . Genital warts    Hx of genital  . GERD (gastroesophageal reflux disease)   . Heart murmur    childhood  . Hematuria   . History of blood transfusion   . History of ectopic pregnancy   . History of gestational diabetes   . History of kidney stones   . History of sepsis    after ectopic pregnancy  . Hypertension   . Idiopathic peripheral neuropathy    both feet  . Leg ulcer, left ([place])   . Low back pain   . Migraines   . Obesity   . Oral candidiasis   . Pneumonia   . PONV (postoperative nausea and vomiting)    prolonged sedation  . Recurrent UTI   . 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 Sigmoid diverticulosis 12/23/2017   noted on CT renal, pt unaware  . Ulcer of foot ([place])    Left  . Weakness      Family History  Problem Relation Age  of Onset  . Diabetes Paternal Grandfather   . COPD Paternal Grandfather   . Heart disease Paternal Grandfather   . Cancer Paternal Grandfather        bone  . Cancer Paternal Grandmother   . Heart disease Paternal Grandmother   . Cancer Father   . Hypertension Father   . Heart attack Father   . Alcohol abuse Father   . Depression Father   . Heart failure Mother   . Diabetes Mother   . Heart disease Mother   . Hyperlipidemia Mother   . Hypertension Mother   . Heart attack Mother   . Peripheral vascular disease Mother   . Depression Mother   . COPD Mother   . Hypertension Sister   . Heart attack Sister   . Stroke Maternal Grandmother    Social History:   reports that she has been smoking cigarettes. She has a 37.00 pack-year  smoking history. She has never used smokeless tobacco. She reports that she does not drink alcohol or use drugs.  Medications  Current Facility-Administered Medications:  .  0.9 %  sodium chloride infusion, 250 mL, Intravenous, Continuous, Dahlstedt, Stephen, MD, Last Rate: 10 mL/hr at 12/15/18 1816, 250 mL at 12/15/18 1816 .  0.9 %  sodium chloride infusion, , Intravenous, Continuous, Hall, Carole N, DO, Last Rate: 50 mL/hr at 12/21/18 1526 .  acetaminophen (TYLENOL) tablet 650 mg, 650 mg, Oral, Q6H PRN, Scatliffe, Kristen D, MD, 650 mg at 12/21/18 1609 .  aspirin tablet 325 mg, 325 mg, Oral, Daily, Irene Pap N, DO, 325 mg at 12/21/18 1609 .  atorvastatin (LIPITOR) tablet 80 mg, 80 mg, Oral, q1800, Kayleen Memos, DO, 80 mg at 12/21/18 1608 .  cefdinir (OMNICEF) capsule 300 mg, 300 mg, Oral, Q12H, Hall, Carole N, DO, 300 mg at 12/21/18 [id number] .  Chlorhexidine Gluconate Cloth 2 % PADS 6 each, 6 each, Topical, Daily, Scatliffe, Rise Paganini, MD, 6 each at 12/18/18 0853 .  heparin injection 5,000 Units, 5,000 Units, Subcutaneous, Q8H, Franchot Gallo, MD, 5,000 Units at 12/21/18 1608 .  insulin aspart (novoLOG) injection 0-5 Units, 0-5 Units, Subcutaneous, QHS, Kayleen Memos, DO, 3 Units at 12/20/18 2218 .  insulin aspart (novoLOG) injection 0-9 Units, 0-9 Units, Subcutaneous, TID WC, Hall, Carole N, DO, 1 Units at 12/21/18 1248 .  insulin glargine (LANTUS) injection 15 Units, 15 Units, Subcutaneous, QHS, Scatliffe, Rise Paganini, MD, 15 Units at 12/20/18 2217 .  MEDLINE mouth rinse, 15 mL, Mouth Rinse, BID, Scatliffe, Kristen D, MD, 15 mL at 12/21/18 0836 .  morphine 2 MG/ML injection 1 mg, 1 mg, Intravenous, Q6H PRN, Scatliffe, Kristen D, MD .  morphine 2 MG/ML injection 2 mg, 2 mg, Intravenous, Q6H PRN, Scatliffe, Kristen D, MD, 2 mg at 12/19/18 0256 .  ondansetron (ZOFRAN) injection 4 mg, 4 mg, Intravenous, Q6H PRN, Franchot Gallo, MD, 4 mg at 12/21/18 1608 .  opium-belladonna (B&O  SUPPRETTES) 16.2-60 MG suppository 1 suppository, 1 suppository, Rectal, Once, Hall, Carole N, DO .  oxybutynin (DITROPAN) tablet 2.5 mg, 2.5 mg, Oral, TID, Hall, Carole N, DO, 2.5 mg at 12/21/18 1609 .  polyethylene glycol (MIRALAX / GLYCOLAX) packet 17 g, 17 g, Oral, Daily, Kayleen Memos, DO, Stopped at 12/21/18 [phone] .  senna-docusate (Senokot-S) tablet 2 tablet, 2 tablet, Oral, BID, Kayleen Memos, DO, 2 tablet at 12/21/18 [phone] .  sodium chloride flush (NS) 0.9 % injection 3 mL, 3 mL, Intravenous, Once, Franchot Gallo, MD   Exam:  Current vital signs: BP (!) 87/63 (BP Location: Right Arm)   Pulse 79   Temp 98.1 F (36.7 C) (Oral)   Resp 19   Ht 5\' 2"  (1.575 m)   Wt 97.3 kg   SpO2 96%   BMI 39.21 kg/m  Vital signs in last 24 hours: Temp:  [97.6 F (36.4 C)-98.1 F (36.7 C)] 98.1 F (36.7 C) (09/30 1430) Pulse Rate:  [65-81] 79 (09/30 1430) Resp:  [18-19] 19 (09/30 1430) BP: (71-120)/(49-74) 87/63 (09/30 1430) SpO2:  [94 %-96 %] 96 % (09/30 1430) Weight:  [97.3 kg] 97.3 kg (09/30 0534)  Physical Exam  Constitutional: Appears well-developed and well-nourished.  Psych: Affect appropriate to situation Eyes: No scleral injection HENT: No OP obstrucion Head: Normocephalic.  Cardiovascular: Normal rate and regular rhythm.  Respiratory: Effort normal, non-labored breathing GI: Soft.  No distension. There is no tenderness.  Skin: WDI  Neuro: Mental Status: Patient is awake, alert, oriented to person, place, month, year, and situation. Patient is able to give a clear and coherent history. No signs of aphasia or neglect Cranial Nerves: II: Right homonymous hemianopsia III,IV, VI: EOMI without ptosis or diploplia. Pupils equal, round and reactive to light V: Facial sensation is symmetric to temperature VII: Facial movement is symmetric.  VIII: hearing is intact to voice X: Palat elevates symmetrically XI: Shoulder shrug is symmetric. XII: tongue is midline without  atrophy or fasciculations.  Motor: Tone is normal. Bulk is normal. 5/5 strength was present in all four extremities.  Sensory: Sensation is symmetric to light touch and temperature in the arms and legs. Deep Tendon Reflexes: 2+ and symmetric in the biceps and 1+ bilateral patellae.  Plantars: Mute bilaterally Cerebellar: FNF and HKS are intact bilaterally  Labs I have reviewed labs in epic and the results pertinent to this consultation are:   CBC    Component Value Date/Time   WBC 9.3 12/16/2018 0541   RBC 3.19 (L) 12/16/2018 0541   HGB 10.2 (L) 12/16/2018 0541   HCT 28.8 (L) 12/16/2018 0741   PLT 175 12/16/2018 0541   MCV 100.6 (H) 12/16/2018 0541   MCH 32.0 12/16/2018 0541   MCHC 31.8 12/16/2018 0541   RDW 15.2 12/16/2018 0541   RDW 13.8 10/05/2013 1532   LYMPHSABS 0.6 (L) 12/15/2018 0757   LYMPHSABS 2.8 10/05/2013 1532   MONOABS 0.9 12/15/2018 0757   EOSABS 0.0 12/15/2018 0757   EOSABS 0.1 10/05/2013 1532   BASOSABS 0.0 12/15/2018 0757   BASOSABS 0.0 10/05/2013 1532    CMP     Component Value Date/Time   NA 138 12/20/2018 0402   NA 139 10/05/2013 1532   K 4.1 12/20/2018 0402   CL 102 12/20/2018 0402   CO2 26 12/20/2018 0402   GLUCOSE 126 (H) 12/20/2018 0402   BUN 20 12/20/2018 0402   BUN 23 10/05/2013 1532   CREATININE 1.27 (H) 12/20/2018 0402   CALCIUM 9.0 12/20/2018 0402   PROT 7.1 12/14/2018 2058   PROT 7.2 10/05/2013 1532   ALBUMIN 2.6 (L) 12/14/2018 2058   ALBUMIN 4.4 10/05/2013 1532   AST 32 12/14/2018 2058   ALT 22 12/14/2018 2058   ALKPHOS 109 12/14/2018 2058   BILITOT 1.1 12/14/2018 2058   GFRNONAA 48 (L) 12/20/2018 0402   GFRAA 56 (L) 12/20/2018 0402    Lipid Panel     Component Value Date/Time   CHOL 183 12/15/2018 0353   TRIG 288 (H) 12/15/2018 0353   HDL 26 (L) 12/15/2018 0353  CHOLHDL 7.0 12/15/2018 0353   VLDL 58 (H) 12/15/2018 0353   LDLCALC 99 12/15/2018 0353     Imaging I have reviewed the images obtained:   MRI  examination of the brain- acute/subacute infarct in the left medial occipital lobe with no hemorrhage.  Pamela Quill PA-C [facility] [phone]  M-F  (9:00 am- 5:00 PM)  12/21/2018, 4:48 PM   Have seen the patient reviewed the above note.  Assessment:  This is a 53 year old female with a one-week history of right visual field abnormalities.  MRI confirms that she does have a stroke in the left medial occipital lobe.  This could eb embolic or thrombotic. Certainly, she has thrombotic risk factors.    Impression: -Stroke -Right homonymous hemianopsia  Recommend #CTA head and neck #Transthoracic Echo,  # Start patient on ASA 325mg  daily,   #StartAtorvastatin 80 mg/other high intensity statin # BP goal: permissive HTN upto 220/120 mmHg # HBAIC and Lipid profile # Telemetry monitoring # Frequent neuro checks # NPO until passes stroke swallow screen # please page stroke NP  Or  PA  Or MD from 8am -4 pm  as this patient from this time will be  followed by the stroke.   You can look them up on [email or web address]  Password [email or web address]    Roland Rack, MD [facility] [phone]  If 7pm- 7am, please page neurology on call as listed in [place].

## 2018-12-21 NOTE — Progress Notes (Signed)
Physical Therapy Treatment Patient Details Name: Pamela Carney MRN: [id number] DOB: 11-06-1965 Today's Date: 12/21/2018    History of Present Illness 53 yr old obese F w/ PMHx of Bipolar Type 1, DM, CKD stage III, HTN, smoker (37 pack years), Nephrolithiasis including staghorn calculus that required nephrostomy Tubes presents to [facility] w/ complaints of abdominal pain accompanied with nausea and vomiting. Pt underwent cystoscopy, R retrograde ureterophyelogram, flouroscopic interpretation, and R double -J stent placement. Opthamology dx Right homonymous hemianopsia MRI 9/30 reveals acute/subacute L occipital infarct    PT Comments    Pt agreeable to ambulate with therapy while waiting for doctor to tell her results of her MRI. Pt requires mod I for transfers and supervision with verbal cues for navigation as pt needs to focus on looking at feet to decrease dizziness with walking. Educated on benefits of outpatient neuro PT for improving dizziness with ambulation. Pt reports that her daughter would be able to provide transportation and that she wants to be able to walk without dizziness. PT will follow acutely, however pt hopeful for d/c home today.      Follow Up Recommendations  Supervision - Intermittent;Outpatient PT;Other (comment)(Outpatient Neuro PT )     Equipment Recommendations  None recommended by PT       Precautions / Restrictions Precautions Precautions: None Restrictions Weight Bearing Restrictions: No    Mobility  Bed Mobility               General bed mobility comments: sitting EoB on entry  Transfers Overall transfer level: Modified independent Equipment used: Rolling walker (2 wheeled) Transfers: Sit to/from Stand Sit to Stand: Modified independent (Device/Increase time)            Ambulation/Gait Ambulation/Gait assistance: Supervision Gait Distance (Feet): 360 Feet Assistive device: Rolling walker (2 wheeled) Gait Pattern/deviations:  Step-through pattern;Decreased stride length;Trunk flexed Gait velocity: decr Gait velocity interpretation: <1.31 ft/sec, indicative of household ambulator General Gait Details: supervision for safety, pt continues to require looking down at her feet with ambulation to decrease dizziness with ambulation    Modified Rankin (Stroke Patients Only) Modified Rankin (Stroke Patients Only) Pre-Morbid Rankin Score: No symptoms Modified Rankin: No significant disability     Balance Overall balance assessment: Needs assistance Sitting-balance support: Feet supported Sitting balance-Leahy Scale: Good     Standing balance support: No upper extremity supported Standing balance-Leahy Scale: Fair                              Cognition Arousal/Alertness: Awake/alert Behavior During Therapy: WFL for tasks assessed/performed Overall Cognitive Status: Within Functional Limits for tasks assessed                                           General Comments General comments (skin integrity, edema, etc.): VSS      Pertinent Vitals/Pain Pain Assessment: No/denies pain           PT Goals (current goals can now be found in the care plan section) Acute Rehab PT Goals Patient Stated Goal: to go home PT Goal Formulation: With patient Time For Goal Achievement: 12/31/18 Potential to Achieve Goals: Good Progress towards PT goals: Progressing toward goals    Frequency    Min 3X/week      PT Plan Discharge plan needs to be updated  AM-PAC PT "6 Clicks" Mobility   Outcome Measure  Help needed turning from your back to your side while in a flat bed without using bedrails?: None Help needed moving from lying on your back to sitting on the side of a flat bed without using bedrails?: None Help needed moving to and from a bed to a chair (including a wheelchair)?: None Help needed standing up from a chair using your arms (e.g., wheelchair or bedside chair)?:  None Help needed to walk in hospital room?: None Help needed climbing 3-5 steps with a railing? : A Little 6 Click Score: 23    End of Session Equipment Utilized During Treatment: Gait belt Activity Tolerance: Patient tolerated treatment well Patient left: with call bell/phone within reach;in chair Nurse Communication: Mobility status PT Visit Diagnosis: Unsteadiness on feet (R26.81);Muscle weakness (generalized) (M62.81);Difficulty in walking, not elsewhere classified (R26.2)     Time: [id number] PT Time Calculation (min) (ACUTE ONLY): 13 min  Charges:  $Gait Training: 8-22 mins                     Natally Ribera B. Migdalia Dk PT, DPT Acute Rehabilitation Services Pager [phone] Office [phone]    [place] 12/21/2018, 2:44 PM

## 2018-12-21 NOTE — Progress Notes (Signed)
Bilateral carotid duplex completed. Preliminary results in Chart review CV Proc. [id number] Miroslav Gin,RVS 12/21/2018, 6;37 PM

## 2018-12-21 NOTE — Plan of Care (Signed)
  Problem: Nutrition: Goal: Risk of aspiration will decrease Outcome: Completed/Met   Problem: Skin Integrity: Goal: Risk for impaired skin integrity will decrease Outcome: Completed/Met

## 2018-12-22 DIAGNOSIS — I6523 Occlusion and stenosis of bilateral carotid arteries: Secondary | ICD-10-CM

## 2018-12-22 DIAGNOSIS — I6340 Cerebral infarction due to embolism of unspecified cerebral artery: Secondary | ICD-10-CM | POA: Insufficient documentation

## 2018-12-22 DIAGNOSIS — N183 Chronic kidney disease, stage 3 unspecified: Secondary | ICD-10-CM

## 2018-12-22 DIAGNOSIS — G463 Brain stem stroke syndrome: Secondary | ICD-10-CM

## 2018-12-22 DIAGNOSIS — I639 Cerebral infarction, unspecified: Secondary | ICD-10-CM | POA: Insufficient documentation

## 2018-12-22 LAB — GLUCOSE, CAPILLARY
Glucose-Capillary: 128 mg/dL — ABNORMAL HIGH (ref 70–99)
Glucose-Capillary: 188 mg/dL — ABNORMAL HIGH (ref 70–99)
Glucose-Capillary: 161 mg/dL — ABNORMAL HIGH (ref 70–99)
Glucose-Capillary: 291 mg/dL — ABNORMAL HIGH (ref 70–99)

## 2018-12-22 LAB — CULTURE, BLOOD (ROUTINE X 2)
Culture: NO GROWTH
Culture: NO GROWTH
Special Requests: ADEQUATE

## 2018-12-22 LAB — BASIC METABOLIC PANEL
Anion gap: 13 (ref 5–15)
BUN: 23 mg/dL — ABNORMAL HIGH (ref 6–20)
CO2: 23 mmol/L (ref 22–32)
Calcium: 9.2 mg/dL (ref 8.9–10.3)
Chloride: 102 mmol/L (ref 98–111)
Creatinine, Ser: 1.34 mg/dL — ABNORMAL HIGH (ref 0.44–1.00)
GFR calc Af Amer: 53 mL/min — ABNORMAL LOW (ref >60)
GFR calc non Af Amer: 45 mL/min — ABNORMAL LOW (ref >60)
Glucose, Bld: 128 mg/dL — ABNORMAL HIGH (ref 70–99)
Potassium: 4.3 mmol/L (ref 3.5–5.1)
Sodium: 138 mmol/L (ref 135–145)

## 2018-12-22 LAB — CBC
HCT: 39.3 % (ref 36.0–46.0)
Hemoglobin: 12.1 g/dL (ref 12.0–15.0)
MCH: 31.6 pg (ref 26.0–34.0)
MCHC: 30.8 g/dL (ref 30.0–36.0)
MCV: 102.6 fL — ABNORMAL HIGH (ref 80.0–100.0)
Platelets: 420 10*3/uL — ABNORMAL HIGH (ref 150–400)
RBC: 3.83 MIL/uL — ABNORMAL LOW (ref 3.87–5.11)
RDW: 14.9 % (ref 11.5–15.5)
WBC: 11.6 10*3/uL — ABNORMAL HIGH (ref 4.0–10.5)
nRBC: 0 % (ref 0.0–0.2)

## 2018-12-22 MED ORDER — SODIUM CHLORIDE 0.9 % IV SOLN
INTRAVENOUS | Status: DC
  Administered 2018-12-23: via INTRAVENOUS

## 2018-12-22 NOTE — TOC Progression Note (Signed)
Transition of Care [facility]) - Progression Note    Patient Details  Name: MAYLING BARRIO MRN: [id number] Date of Birth: [birth year]  [facility]) CM/SW Contact  Graves-Bigelow, Ocie Cornfield, RN Phone Number: 12/22/2018, 10:00 AM  Clinical Narrative: Ambulatory referral sent to Neuro Rehab- patient wants DME Shower chair for home. DME sent to [facility] for delivery will follow to see vascular surgery recommendations before delivery of DME. CM will follow for additional  transition of care needs.    Expected Discharge Plan: Home/Self Care Barriers to Discharge: Continued Medical Work up  Expected Discharge Plan and Services Expected Discharge Plan: Home/Self Care In-house Referral: PCP / [facility] Discharge Planning Services: CM Consult, Follow-up appt scheduled, [place] Acute Care Choice: NA Living arrangements for the past 2 months: Single Family Home                 DME Arranged: Shower stool DME Agency: [employer] Date DME Agency Contacted: 12/22/18 Time DME Agency Contacted: [phone] Representative spoke with at DME Agency: [facility] HH Arranged: NA [place] Agency: NA      Social Determinants of Health ([place]) Interventions    Readmission Risk Interventions No flowsheet data found.

## 2018-12-22 NOTE — H&P (View-Only) (Signed)
Hospital Consult    Reason for Consult:  Symptomatic carotid stenosis Requesting Physician:  Nevada Crane MRN #:  [id number]  History of Present Illness: This is a 53 y.o. 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    She had been c/o about the right eye.  She states that it has the appearance of a kaleidoscope and is constant.  This has been present for about 2 weeks.  She states it started about a week before she was admitted.  She states that she also has some numbness and tingling in the fingers of the left hand that has been going on for about the same time as her eye issues.  She denies any issues with the left leg, but states she does have neuropathy.   She states she does get cramping in her legs when she walks.  She states that she does not think she can walk a city block without cramping.  When she stops, the cramping gets better but still achy.  She states she had some non healing wounds on the left foot a couple years ago and took about 6 months to heal.  No issues with wounds since.      An ophthalmology consult was obtained.  An MRI was obtained and found to have an acute/subacute infarct in the right medial occipital lobe.   She had a carotid duplex yesterday that revealed 60-79% right carotid stenosis and left 40-59% carotid stenosis. .  The vertebrals had antegrade flow and subclavians had normal flow hemodynamics.    VVS is consulted for symptomatic carotid artery stenosis.   The pt is on a statin for cholesterol management.  The pt is on a daily aspirin.   Other AC:  Sq heparin The pt is not on meds for hypertension.   The pt is diabetic.   Tobacco hx:  current  Past Medical History:  Diagnosis Date   Abnormal Pap smear    AKI (acute kidney injury) ([place]) 12/28/2017   Anxiety    Arthritis    bil knees, neck   Bipolar 1 disorder (HCC)    Cataract    Mild    Cholelithiasis 12/23/2017   noted on CT renal, pt unaware   Chronic kidney disease (CKD), stage III (moderate) (HCC)    Cystitis    Diabetes mellitus without complication (HCC)    Genital warts    Hx of genital   GERD (gastroesophageal reflux disease)    Heart murmur    childhood   Hematuria    History of blood transfusion    History of ectopic pregnancy    History of gestational diabetes    History of kidney stones    History of sepsis    after ectopic pregnancy   Hypertension    Idiopathic peripheral neuropathy    both feet   Leg ulcer, left (HCC)    Low back pain    Migraines    Obesity    Oral candidiasis    Pneumonia    PONV (postoperative nausea and vomiting)    prolonged sedation   Recurrent UTI    Renal calculi 12/23/2017   Multiple bilateral nonobstructing, 2.2 cm lower pole partial staghorn on left, noted on CT renal   Sigmoid diverticulosis 12/23/2017   noted on CT renal, pt unaware   Ulcer of foot ([place])    Left   Weakness     Past Surgical  History:  Procedure Laterality Date   CERVICAL CONE BIOPSY     CESAREAN SECTION     x3   CYSTOSCOPY W/ URETERAL STENT PLACEMENT Right 12/15/2018   Procedure: Cystoscopy, right retrograde ureteropyelogram, fluoroscopic interpretation, right double-J stent placement (24 cm x 6 [place]);  Surgeon: Franchot Gallo, MD;  Location: [facility];  Service: Urology;  Laterality: Right;   CYSTOSCOPY/URETEROSCOPY/HOLMIUM LASER/STENT PLACEMENT Right 02/04/2018   Procedure: CYSTOSCOPY/URETEROSCOPY/RETROGRADE PYELOGRAM/HOLMIUM LASER/STENT PLACEMENT;  Surgeon: Alexis Frock, MD;  Location: [facility];  Service: Urology;  Laterality: Right;   CYSTOSCOPY/URETEROSCOPY/HOLMIUM LASER/STENT PLACEMENT Right 02/07/2018   Procedure: CYSTOSCOPY LEFT STENT EXCHANGE;  Surgeon: Alexis Frock, MD;  Location: [facility];  Service: Urology;  Laterality: Right;   DILATION AND CURETTAGE OF UTERUS     ECTOPIC PREGNANCY SURGERY       IR NEPHROSTOMY PLACEMENT LEFT  12/25/2017   NEPHROLITHOTOMY Left 02/04/2018   Procedure: NEPHROLITHOTOMY PERCUTANEOUS;  Surgeon: Alexis Frock, MD;  Location: [facility];  Service: Urology;  Laterality: Left;  3 HRS   NEPHROLITHOTOMY Left 02/07/2018   Procedure: SECOND LOOK NEPHROLITHOTOMY PERCUTANEOUS;  Surgeon: Alexis Frock, MD;  Location: [facility];  Service: Urology;  Laterality: Left;  2 HRS   OVARIAN CYST REMOVAL     UNILATERAL SALPINGECTOMY     Pt unsure of which fallopian tube was removed   WISDOM TOOTH EXTRACTION      Allergies  Allergen Reactions   Sulfa Antibiotics Hives and Swelling    Swelling site not recalled   Latex Rash   Tape Rash    Not tolerated well    Prior to Admission medications   Medication Sig Start Date End Date Taking? Authorizing Provider  insulin degludec (TRESIBA) 100 UNIT/ML SOPN FlexTouch Pen Inject 0.2 mLs (20 Units total) into the skin daily. 02/02/18  Yes Danford, Berna Spare, NP  levonorgestrel (MIRENA) 20 MCG/24HR IUD 1 each by Intrauterine route once.   Yes [provider]  metFORMIN (GLUCOPHAGE) 500 MG tablet Take 1 tablet (500 mg total) by mouth 2 (two) times daily with a meal. PATIENT MUST HAVE OFFICE VISIT PRIOR TO ANY FURTHER REFILLS! 09/05/18  Yes Danford, Berna Spare, NP  Multiple Vitamins-Minerals (ONE-A-DAY WOMENS 50+ ADVANTAGE) TABS Take 1 tablet by mouth daily.   Yes [provider]  Blood Glucose Monitoring Suppl (ONETOUCH VERIO) w/Device KIT Use to check blood sugars every morning fasting and 2 hours after largest meal 02/02/18   Danford, Katy D, NP  glucose blood (ONETOUCH VERIO) test strip Use to check blood sugars every morning fasting and 2 hours after largest meal 02/02/18   Danford, Katy D, NP  Insulin Pen Needle (PEN NEEDLES) 31G X 8 MM MISC 1 each by Does not apply route daily. 02/23/18   Esaw Grandchild, NP  ONE TOUCH LANCETS MISC Use to check blood sugars every morning fasting and 2 hours after largest meal  02/02/18   Danford, Berna Spare, NP    Social History   Socioeconomic History   Marital status: Married    Spouse name: Not on file   Number of children: Not on file   Years of education: Not on file   Highest education level: Not on file  Occupational History   Not on file  Social Needs   Financial resource strain: Not on file   Food insecurity    Worry: Not on file    Inability: Not on file   Transportation needs    Medical: Not on file    Non-medical: Not  on file  Tobacco Use   Smoking status: Current Every Day Smoker    Packs/day: 1.00    Years: 37.00    Pack years: 37.00    Types: Cigarettes   Smokeless tobacco: Never Used  Substance and Sexual Activity   Alcohol use: No    Alcohol/week: 0.0 standard drinks   Drug use: No   Sexual activity: Not Currently    Birth control/protection: I.U.D.  Lifestyle   Physical activity    Days per week: Not on file    Minutes per session: Not on file   Stress: Not on file  Relationships   Social connections    Talks on phone: Not on file    Gets together: Not on file    Attends religious service: Not on file    Active member of club or organization: Not on file    Attends meetings of clubs or organizations: Not on file    Relationship status: Not on file   Intimate partner violence    Fear of current or ex partner: Not on file    Emotionally abused: Not on file    Physically abused: Not on file    Forced sexual activity: Not on file  Other Topics Concern   Not on file  Social History Narrative   Not on file     Family History  Problem Relation Age of Onset   Diabetes Paternal Grandfather    COPD Paternal Grandfather    Heart disease Paternal Grandfather    Cancer Paternal Grandfather        bone   Cancer Paternal Grandmother    Heart disease Paternal Grandmother    Cancer Father    Hypertension Father    Heart attack Father    Alcohol abuse Father    Depression Father    Heart  failure Mother    Diabetes Mother    Heart disease Mother    Hyperlipidemia Mother    Hypertension Mother    Heart attack Mother    Peripheral vascular disease Mother    Depression Mother    COPD Mother    Hypertension Sister    Heart attack Sister    Stroke Maternal Grandmother     ROS: _0  Positive   _1  Negative   _2  All sytems reviewed and are negative  Cardiac: _3  hx of high blood pressure   Vascular: _4  pain in legs while walking _5  non-healing ulcers-hx of  Pulmonary: _6  productive cough _7  asthma/wheezing _8  home O2  Neurologic: _9  weakness in _10  arms _11  legs _12  numbness in _13  arms _14  legs _15  hx of CVA _16  mini stroke _17 difficulty speaking or slurred speech _18  temporary loss of vision in one eye _19  dizziness  Hematologic: _20  recent E coli bacteremia from UTI  Endocrine:   _21  diabetes _22  thyroid disease  GI _23  vomiting blood _24  blood in stool  GU: _25  CKD/renal failure _26  HD--_27  M/W/F or _28  T/T/S _29  recent UTI   Psychiatric: _30  bipolar  Musculoskeletal: _31  back pain  _32  arthritis   Integumentary: _33  rashes _34  ulcers  Constitutional: _35  fever before admission _36  chills   Physical Examination  Vitals:   12/22/18 0451 12/22/18 0800  BP: (!) 91/49 (!) 87/59  Pulse: 98 69  Resp: 18   Temp: 98.7 F (37.1 C) 97.6 F (36.4 C)  SpO2: 95% 93%   Body mass index is 39.2 kg/m.  General:  WDWN in NAD Gait: Not observed HENT: WNL, normocephalic Pulmonary:  normal non-labored breathing, without Rales, rhonchi,  wheezing Cardiac: regular, without  Murmurs, rubs or gallops; without carotid bruits Abdomen:  soft, obese NT/ND, no masses Skin: without rashes Vascular Exam/Pulses:  Right Left  Radial 2+ (normal) absent  Ulnar Unable to palpate  absent  Femoral Unable to palpate  2+ (normal)  Popliteal Unable to palpate  Unable to palpate   DP Unable to palpate  Unable to palpate   PT Unable to palpate  Unable to  palpate    Extremities: without ischemic changes, without Gangrene , without cellulitis; without open wounds;  Musculoskeletal: no muscle wasting or atrophy  Neurologic: A&O X 3;  No focal weakness or paresthesias are detected; speech is fluent/normal Psychiatric:  The pt has Normal affect. Lymph:  No inguinal lymphadenopathy appreciated.   CBC    Component Value Date/Time   WBC 11.6 (H) 12/22/2018 0823   RBC 3.83 (L) 12/22/2018 0823   HGB 12.1 12/22/2018 0823   HCT 39.3 12/22/2018 0823   HCT 28.8 (L) 12/16/2018 0741   PLT 420 (H) 12/22/2018 0823   MCV 102.6 (H) 12/22/2018 0823   MCH 31.6 12/22/2018 0823   MCHC 30.8 12/22/2018 0823   RDW 14.9 12/22/2018 0823   RDW 13.8 10/05/2013 1532   LYMPHSABS 0.6 (L) 12/15/2018 0757   LYMPHSABS 2.8 10/05/2013 1532   MONOABS 0.9 12/15/2018 0757   EOSABS 0.0 12/15/2018 0757   EOSABS 0.1 10/05/2013 1532   BASOSABS 0.0 12/15/2018 0757   BASOSABS 0.0 10/05/2013 1532    BMET    Component Value Date/Time   NA 138 12/22/2018 0823   NA 139 10/05/2013 1532   K 4.3 12/22/2018 0823   CL 102 12/22/2018 0823   CO2 23 12/22/2018 0823   GLUCOSE 128 (H) 12/22/2018 0823   BUN 23 (H) 12/22/2018 0823   BUN 23 10/05/2013 1532   CREATININE 1.34 (H) 12/22/2018 0823   CALCIUM 9.2 12/22/2018 0823   GFRNONAA 45 (L) 12/22/2018 0823   GFRAA 53 (L) 12/22/2018 0823    COAGS: Lab Results  Component Value Date   INR 1.1 12/15/2018   INR 1.38 12/23/2017     Non-Invasive Vascular Imaging:   Carotid duplex 12/21/2018: Summary: Right Carotid: Velocities in the right ICA are consistent with a 60-79%                stenosis. See technical comments listed above. Left Carotid: Velocities in the left ICA are consistent with a 40-59% stenosis.               See technical comments listed above.  Vertebrals:  Bilateral vertebral arteries demonstrate antegrade flow. Subclavians: Normal flow hemodynamics were seen in bilateral subclavian               arteries.  CTA head/neck 12/21/2018: IMPRESSION: 1. 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 2. Positive also for widespread atherosclerosis and stenosis in the head and neck: - Severe Left Subclavian Artery origin stenosis. - Severe Right ICA bulb stenosis (estimated at 70-80%). - diminutive Left Vertebral Artery which occludes beyond the left PICA origin, and is moderately stenotic at its origin. - Left ICA bulb 60% stenosis. - heavily calcified ICA siphons with moderate stenosis on the Left. - diminutive Basilar Artery with mild stenosis. 3.  Aortic Atherosclerosis (ICD10-I70.0). 4. Carious dentition.  MRI brain 12/21/2018:  IMPRESSION: Acute/subacute infarct left medial occipital lobe without hemorrhage Moderate chronic microvascular ischemic change  CT renal study 12/15/2018: IMPRESSION: 1. 10 mm obstructive stone within the distal right ureter with secondary moderate left hydroureteronephrosis. 2. Additional bilateral nonobstructive nephrolithiasis as above. 3. Colonic diverticulosis without evidence for acute diverticulitis. 4. Mildly enlarged periaortic and aortocaval adenopathy within the upper abdomen, mildly increased from previous. Finding is indeterminate, but could be reactive in nature. Attention at follow-up. 5. Moderate aorto bi-iliac atherosclerotic disease.   ASSESSMENT/PLAN: This is a 53 y.o. female with symptomatic right carotid artery stenosis  -pt with right eye visual disturbance and left hand numbness and tingling in her fingers that started about the same time as her visual disturbance.  Her carotid duplex reveals 50-69% and CTA reveals 70-80% severe right ICA stenosis at the bulb.   Pt will need TCAR vs open carotid endarterectomy most likely next week.   -she does also have a moderate stenosis on the left.  She has severe left subclavian artery stenosis and she does not have a  palpable left radial or ulnar pulse.   -pt does have CKD III with creatinine of 1.34.  Pt receiving gentle IVF for hydration.  She has resolving sepsis with recent shock secondary to complicated Enterococcus faecalis/E coli UTI in setting of right distal ureteral calculus with pyelonephrosis and E coli bacteremia and has completed 5 days of rocephin and now on Keflex 521m tid x 5 days that started 12/20/2018.   -pt does have moderate aorto-iliac disease based on CT of the abdomen on9/24/2020.  By her description, she does have bilateral claudication.  She did have some non healing wounds in the past on the left foot that took about 6 months to heal.  These did eventually heal.  She is a current smoker and I discussed with her the importance of smoking cessation and that with her aorto-occlusive disease, carotid disease, combined with her diabetes, that she is a high risk for future stroke, heart attack, lung cancer and possible limb loss in the future.  May benefit from getting baseline ABI's.  -There was periaortic and aortocaval adenopathy-per primary team.   SLeontine Locket PA-C [facility] [phone] I have independently interviewed and examined patient and agree with PA assessment and plan above.  Stroke demonstrated by MRI with persistent right visual field deficit. Stroke work-up ongoing.  Discussed with Dr. sAndres Egeand stroke is left occipital unlikely related to carotid artery stenosis.  She does have significant calcifications throughout her arterial tree demonstrated on CTA and will need follow-up.  Medical therapy from a vascular standpoint for now and can follow-up in 6 months with carotid duplex in our office.  Leira Regino C. CDonzetta Matters MD [facility] [place]iotOffice: [phone]: [phone]

## 2018-12-22 NOTE — Progress Notes (Signed)
PROGRESS NOTE  Pamela Carney [id number] DOB: 10/13/1965 DOA: 12/14/2018 PCP: Patient, No Pcp Per  HPI/Recap of past 24 hours: 53 yr old F w/ PMHx sig for nephrolithiasis and Bipolar disorder presented to [facility] w/ abdominal pain and emesis found to have a 11 mm obtsructng stone.Septic shock. POD 1 Cystoscopy, right retrograde ureteropyelogram, fluoroscopic interpretation, right double-J stent placement. 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 Per EDP documentation the patient stated that these symptoms began three days ago and have become progressively worse. She also reported subjective fevers, and a non productive cough.   POD 1 Cystoscopy, right retrograde ureteropyelogram, fluoroscopic interpretation, right double-J stent placement. Seen Overnight laying in bed. Ambulates to rest room. Only required severe pain meds once overnight post void. Tolerated diet. Off of Levophed gtt for several hours and remains hemodynamically stable.   Transferred to [facility] service on 12/17/2018.   Vaginal candidiasis treated on 12/18/18 with 1 dose p.o. fluconazole 200 mg once.   Reports ureteral spasm and discomfort of lower abdomen on 12/19/2018, started on Pyridium.   Hospital course complicated by persistent right eye discomfort which prompted ophthalmology consult.  Recommendation for MRI brain.  Completed on 12/21/2018, showed acute/subacute ischemic CVA involving left medial occipital lobe.  Seen by stroke team.  Discussed with Dr. Talbert Forest, will follow-up with ophthalmology outpatient.  12/22/18: Patient was seen and examined at her bedside this morning.  No acute events overnight.  Continues to have mild dizziness. She  continues to see rings with her right eye opened or closed.   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 Stroke work-up: LDL 99 with goal less than 70 A1c 8.6 with goal less than 7.0 2D echo done on 12/15/2018 showed normal LVEF with no evidence of PFO Continue high-dose aspirin 325 mg daily and Lipitor 80 mg daily Twelve-lead EKG done on 12/21/2018 showed normal sinus rhythm Continue normal saline at 50 cc/h x 1 day Bilateral carotid Doppler ultrasound showed right ICA 60 to 79% stenosis, left ICA 40 to 59% stenosis Vascular surgery Dr. Donzetta Matters paged for consult.  Bilateral carotid artery stenosis right greater than left Doppler ultrasound revealed right ICA 60 to 79% stenosis, left ICA 40 to 59% stenosis Vascular surgery paged for consult Continue high-dose aspirin 325 mg daily and Lipitor 80 mg daily  CKD 3 Baseline creatinine appears to be 1.1 with GFR 52 Creatinine 1.34 on 12/22/2018 with GFR of 45 Continue to avoid nephrotoxins Continue to avoid hypotension Continue gentle IV fluid hydration normal saline at 75 cc/h Monitor urine output Repeat BMP in the morning  Resolving sepsis with recent septic shock secondary to complicated Enterococcus faecalis/E. coli UTI in the setting of right distal ureteral calculus with pyelonephrosis and E. coli bacteremia Off pressors Urine culture taken on 12/15/2018 grew 20,000 colonies of Enterococcus faecalis, 2000 colonies of E. coli Resistance to ampicillin and ciprofloxacin intermediate resistance to ampicillin/sulbactam Completed 5 days of Rocephin Switch to Keflex 500 mg 3 times daily  on 12/20/18 x5 days  Right homonymous hemianopsia  /right eye discomfort secondary to acute/subacute ischemic CVA involving left medial occipital lobe States she has been seeing rings with her right eye and is causing dizziness Seen by ophthalmology Dr. Talbert Forest who recommended brain MRI. Informed Dr. Talbert Forest of new findings; follow-up with ophthalmology Dr. Talbert Forest outpatient.  E. coli bacteremia, poa Management for stated above Repeated blood cultures x2 done on 12/17/2018- to date Currently on cefdinir We will complete total of 10 days of antibiotics  UTI with LUTS, POA Management as stated above  Ureteral/bladder spasm Continue Pyridium 100 mg 3 times daily x3 days Start oxybutynin and continue until seen at the neurology clinic Obtain KUB to assure proper placement of stent  Vaginal candidiasis Ordered 1 dose of fluconazole 200 mg once  Resolving prerenal AKI with IV fluid hydration Appears to be at her baseline creatinine 1.1 with GFR of 52 Presented with creatinine of 2.45 and GFR 22 DC IV fluid Continue to avoid nephrotoxins such as NSAIDs Good urine output greater than 3.9 L recorded in the last 24 hours  E. coli bacteremia likely from urinary source Blood cultures drawn on 12/15/2018 1 out of 2 bottles grew E. coli Resistant to ampicillin, ampicillin/sulbactam, ciprofloxacin Completed 5 days of Rocephin Repeat blood culture x2 peripherally on 12/17/18 no growth to date Remove central line right IJ on 12/19/2018   Right distal ureteral calculus with pyonephrosis status post right cystoscopy and stent with urology on 12/15/2018 Continue to optimize pain control Reports persistent spasm; will contact urology.  Type 2 diabetes with hyperglycemia Blood sugar is better controlled Hemoglobin A1c 8.6 on 12/15/2018 Continue insulin coverage Continue to hold off oral anti-glycemic's  Physical debility: Improving PT OT had no further recommendations Continue to mobilize Fall precautions  History of type I bipolar  disorder Currently not on any medications.  Pharmacy review  Moderate aorto bi-iliac atherosclerotic disease Seen on CT study Lipid panel: LDL 99 Chol 183 TG 288 HDL 26 P Given that pt is >40 yrs of age has a h/o DM Started Atorvastatin 40 mg to be continued on discharge  Severe morbid obesity BMI 40 Recommend weight loss outpatient with regular physical activity and healthy dieting   Code Status: Full code  Family Communication: None at bedside  Disposition Plan: Possible discharge to home tomorrow 12/20/2018.   Consultants:  PCCM  Urology  Vascular surgery on 12/22/2018.    Antimicrobials:  Cefdinir  DVT prophylaxis: Subcu heparin 3 times daily   Objective: Vitals:   12/21/18 2059 12/22/18 0040 12/22/18 0451 12/22/18 0800  BP: (!) 80/56 (!) 95/58 (!) 91/49 (!) 87/59  Pulse: 60  98 69  Resp: 20  18   Temp: 98 F (36.7 C)  98.7 F (37.1 C) 97.6 F (36.4 C)  TempSrc: Oral  Oral Oral  SpO2: 93%  95% 93%  Weight:   97.2 kg   Height:        Intake/Output Summary (Last 24 hours) at 12/22/2018 0940 Last data filed at 12/22/2018 0835 Gross per 24 hour  Intake 1487.77 ml  Output 2900 ml  Net -1412.23 ml   Filed Weights   12/20/18 0531 12/21/18 0534 12/22/18 0451  Weight: 97.2 kg 97.3 kg 97.2 kg    Exam:   General: 53 y.o. year-old female well-developed well-nourished no acute distress.  Alert and oriented x3.    Cardiovascular: Regular rate and rhythm no rubs or gallops no JVD or thyromegaly noted.    Respiratory: Clear  to auscultation no wheezes no rales.  Poor inspiratory effort.   Abdomen: Obese nontender nondistended normal bowel sounds present. Musculoskeletal: 2 out of 4 pulses in all 4 extremities.  Psychiatry: Mood is appropriate for condition and setting.   Data Reviewed: CBC: Recent Labs  Lab 12/16/18 0541 12/16/18 0741 12/22/18 0823  WBC 9.3  --  11.6*  HGB 10.2*  --  12.1  HCT 32.1* 28.8* 39.3  MCV 100.6*  --  102.6*  PLT  175  --  0000000*   Basic Metabolic Panel: Recent Labs  Lab 12/15/18 2154 12/16/18 0541 12/18/18 0549 12/19/18 0500 12/20/18 0402 12/22/18 0823  NA  --  137 134* 136 138 138  K  --  4.1 3.5 3.5 4.1 4.3  CL  --  102 97* 98 102 102  CO2  --  25 28 31 26 23   GLUCOSE  --  181* 139* 138* 126* 128*  BUN  --  36* 24* 15 20 23*  CREATININE  --  1.51* 1.38* 1.19* 1.27* 1.34*  CALCIUM  --  8.9 8.8* 8.8* 9.0 9.2  MG 2.1  --   --   --   --   --   PHOS 3.7  --   --   --   --   --    GFR: Estimated Creatinine Clearance: 53.4 mL/min (A) (by C-G formula based on SCr of 1.34 mg/dL (H)). Liver Function Tests: No results for input(s): AST, ALT, ALKPHOS, BILITOT, PROT, ALBUMIN in the last 168 hours. No results for input(s): LIPASE, AMYLASE in the last 168 hours. No results for input(s): AMMONIA in the last 168 hours. Coagulation Profile: No results for input(s): INR, PROTIME in the last 168 hours. Cardiac Enzymes: No results for input(s): CKTOTAL, CKMB, CKMBINDEX, TROPONINI in the last 168 hours. BNP (last 3 results) No results for input(s): PROBNP in the last 8760 hours. HbA1C: No results for input(s): HGBA1C in the last 72 hours. CBG: Recent Labs  Lab 12/21/18 0802 12/21/18 1239 12/21/18 1722 12/21/18 2058 12/22/18 0732  GLUCAP 133* 147* 187* 143* 128*   Lipid Profile: No results for input(s): CHOL, HDL, LDLCALC, TRIG, CHOLHDL, LDLDIRECT in the last 72 hours. Thyroid Function Tests: No results for input(s): TSH, T4TOTAL, FREET4, T3FREE, THYROIDAB in the last 72 hours. Anemia Panel: No results for input(s): VITAMINB12, FOLATE, FERRITIN, TIBC, IRON, RETICCTPCT in the last 72 hours. Urine analysis:    Component Value Date/Time   COLORURINE YELLOW 12/14/2018 2030   APPEARANCEUR CLOUDY (A) 12/14/2018 2030   LABSPEC 1.016 12/14/2018 2030   PHURINE 6.0 12/14/2018 2030   GLUCOSEU NEGATIVE 12/14/2018 2030   HGBUR MODERATE (A) 12/14/2018 2030   [place] NEGATIVE 12/14/2018 2030    [place] 12/14/2018 2030   PROTEINUR 100 (A) 12/14/2018 2030   UROBILINOGEN 0.2 03/31/2010 0246   NITRITE NEGATIVE 12/14/2018 2030   LEUKOCYTESUR LARGE (A) 12/14/2018 2030   Sepsis Labs: @LABRCNTIP (procalcitonin:4,lacticidven:4)  ) Recent Results (from the past 240 hour(s))  Blood Culture (routine x 2)     Status: Abnormal   Collection Time: 12/15/18 12:16 AM   Specimen: BLOOD  Result Value Ref Range Status   Specimen Description BLOOD RIGHT FOREARM  Final   Special Requests   Final    BOTTLES DRAWN AEROBIC AND ANAEROBIC Blood Culture adequate volume   Culture  Setup Time   Final    GRAM NEGATIVE RODS IN BOTH AEROBIC AND ANAEROBIC BOTTLES CRITICAL RESULT CALLED TO, READ BACK BY AND VERIFIED WITH: Tillman Sers  PHARMD [id number] 12/15/18 A BROWNING Performed at [facility], [place] [address], [place], [place] [address]    Culture ESCHERICHIA COLI (A)  Final   Report Status 12/17/2018 FINAL  Final   Organism ID, Bacteria ESCHERICHIA COLI  Final      Susceptibility   Escherichia coli - MIC*    AMPICILLIN >=32 RESISTANT Resistant     CEFAZOLIN 16 SENSITIVE Sensitive     CEFEPIME <=1 SENSITIVE Sensitive     CEFTAZIDIME <=1 SENSITIVE Sensitive     CEFTRIAXONE <=1 SENSITIVE Sensitive     CIPROFLOXACIN >=4 RESISTANT Resistant     GENTAMICIN <=1 SENSITIVE Sensitive     IMIPENEM <=0.25 SENSITIVE Sensitive     TRIMETH/SULFA <=20 SENSITIVE Sensitive     AMPICILLIN/SULBACTAM >=32 RESISTANT Resistant     PIP/TAZO <=4 SENSITIVE Sensitive     Extended ESBL NEGATIVE Sensitive     * ESCHERICHIA COLI  Blood Culture ID Panel (Reflexed)     Status: Abnormal   Collection Time: 12/15/18 12:16 AM  Result Value Ref Range Status   Enterococcus species NOT DETECTED NOT DETECTED Final   Listeria monocytogenes NOT DETECTED NOT DETECTED Final   Staphylococcus species NOT DETECTED NOT DETECTED Final   Staphylococcus aureus (BCID) NOT DETECTED NOT DETECTED Final   Streptococcus species NOT  DETECTED NOT DETECTED Final   Streptococcus agalactiae NOT DETECTED NOT DETECTED Final   Streptococcus pneumoniae NOT DETECTED NOT DETECTED Final   Streptococcus pyogenes NOT DETECTED NOT DETECTED Final   Acinetobacter baumannii NOT DETECTED NOT DETECTED Final   Enterobacteriaceae species DETECTED (A) NOT DETECTED Final    Comment: Enterobacteriaceae represent a large family of gram-negative bacteria, not a single organism. CRITICAL RESULT CALLED TO, READ BACK BY AND VERIFIED WITH: Tillman Sers PHARMD [id number] 12/15/18 A BROWNING    Enterobacter cloacae complex NOT DETECTED NOT DETECTED Final   Escherichia coli DETECTED (A) NOT DETECTED Final    Comment: CRITICAL RESULT CALLED TO, READ BACK BY AND VERIFIED WITH: Tillman Sers PHARMD [id number] 12/15/18 A BROWNING    Klebsiella oxytoca NOT DETECTED NOT DETECTED Final   Klebsiella pneumoniae NOT DETECTED NOT DETECTED Final   Proteus species NOT DETECTED NOT DETECTED Final   Serratia marcescens NOT DETECTED NOT DETECTED Final   Carbapenem resistance NOT DETECTED NOT DETECTED Final   Haemophilus influenzae NOT DETECTED NOT DETECTED Final   Neisseria meningitidis NOT DETECTED NOT DETECTED Final   Pseudomonas aeruginosa NOT DETECTED NOT DETECTED Final   Candida albicans NOT DETECTED NOT DETECTED Final   Candida glabrata NOT DETECTED NOT DETECTED Final   Candida krusei NOT DETECTED NOT DETECTED Final   Candida parapsilosis NOT DETECTED NOT DETECTED Final   Candida tropicalis NOT DETECTED NOT DETECTED Final    Comment: Performed at [facility], [place] [address] [place], [place] [address]  SARS Coronavirus 2 [facility] order, Performed in [facility] hospital lab) Nasopharyngeal Nasopharyngeal Swab     Status: None   Collection Time: 12/15/18 12:19 AM   Specimen: Nasopharyngeal Swab  Result Value Ref Range Status   SARS Coronavirus 2 NEGATIVE NEGATIVE Final    Comment: (NOTE) If result is NEGATIVE SARS-CoV-2 target nucleic acids are NOT DETECTED. The  SARS-CoV-2 RNA is generally detectable in upper and lower  respiratory specimens during the acute phase of infection. The lowest  concentration of SARS-CoV-2 viral copies this assay can detect is 250  copies / mL. A negative result does not preclude SARS-CoV-2 infection  and should not be used as  the sole basis for treatment or other  patient management decisions.  A negative result may occur with  improper specimen collection / handling, submission of specimen other  than nasopharyngeal swab, presence of viral mutation(s) within the  areas targeted by this assay, and inadequate number of viral copies  (<250 copies / mL). A negative result must be combined with clinical  observations, patient history, and epidemiological information. If result is POSITIVE SARS-CoV-2 target nucleic acids are DETECTED. The SARS-CoV-2 RNA is generally detectable in upper and lower  respiratory specimens dur ing the acute phase of infection.  Positive  results are indicative of active infection with SARS-CoV-2.  Clinical  correlation with patient history and other diagnostic information is  necessary to determine patient infection status.  Positive results do  not rule out bacterial infection or co-infection with other viruses. If result is PRESUMPTIVE POSTIVE SARS-CoV-2 nucleic acids MAY BE PRESENT.   A presumptive positive result was obtained on the submitted specimen  and confirmed on repeat testing.  While 2019 novel coronavirus  (SARS-CoV-2) nucleic acids may be present in the submitted sample  additional confirmatory testing may be necessary for epidemiological  and / or clinical management purposes  to differentiate between  SARS-CoV-2 and other Sarbecovirus currently known to infect humans.  If clinically indicated additional testing with an alternate test  methodology [phone]) is advised. The SARS-CoV-2 RNA is generally  detectable in upper and lower respiratory sp ecimens during the acute   phase of infection. The expected result is Negative. Fact Sheet for Patients:  StrictlyIdeas.no Fact Sheet for Healthcare Providers: BankingDealers.co.za This test is not yet approved or cleared by the [place] FDA and has been authorized for detection and/or diagnosis of SARS-CoV-2 by FDA under an Emergency Use Authorization (EUA).  This EUA will remain in effect (meaning this test can be used) for the duration of the COVID-19 declaration under Section 564(b)(1) of the Act, 21 U.S.C. section 360bbb-3(b)(1), unless the authorization is terminated or revoked sooner. Performed at [facility], [place] [address] [place], [place] [address]   Blood Culture (routine x 2)     Status: None   Collection Time: 12/15/18  2:26 AM   Specimen: BLOOD  Result Value Ref Range Status   Specimen Description BLOOD RIGHT ARM  Final   Special Requests   Final    BOTTLES DRAWN AEROBIC AND ANAEROBIC Blood Culture adequate volume   Culture   Final    NO GROWTH 5 DAYS Performed at [facility], [place] [address] [place], [place] [address]    Report Status 12/20/2018 FINAL  Final  MRSA PCR Screening     Status: None   Collection Time: 12/15/18  6:35 AM   Specimen: Nasopharyngeal  Result Value Ref Range Status   MRSA by PCR NEGATIVE NEGATIVE Final    Comment:        The GeneXpert MRSA Assay (FDA approved for NASAL specimens only), is one component of a comprehensive MRSA colonization surveillance program. It is not intended to diagnose MRSA infection nor to guide or monitor treatment for MRSA infections. Performed at [facility], [place] [address], [place],  [address]   Urine culture     Status: Abnormal   Collection Time: 12/15/18  8:55 AM   Specimen: In/Out Cath Urine  Result Value Ref Range Status   Specimen Description IN/OUT CATH URINE  Final   Special Requests   Final    NONE Performed at [facility],  [place] [address], [place], [place] [address]    Culture (A)  Final    20,000 COLONIES/mL ENTEROCOCCUS FAECALIS 2,000 COLONIES/mL ESCHERICHIA COLI    Report Status 12/17/2018 FINAL  Final   Organism ID, Bacteria ENTEROCOCCUS FAECALIS (A)  Final   Organism ID, Bacteria ESCHERICHIA COLI (A)  Final      Susceptibility   Escherichia coli - MIC*    AMPICILLIN >=32 RESISTANT Resistant     CEFAZOLIN <=4 SENSITIVE Sensitive     CEFTRIAXONE <=1 SENSITIVE Sensitive     CIPROFLOXACIN >=4 RESISTANT Resistant     GENTAMICIN <=1 SENSITIVE Sensitive     IMIPENEM <=0.25 SENSITIVE Sensitive     NITROFURANTOIN <=16 SENSITIVE Sensitive     TRIMETH/SULFA <=20 SENSITIVE Sensitive     AMPICILLIN/SULBACTAM 16 INTERMEDIATE Intermediate     PIP/TAZO <=4 SENSITIVE Sensitive     Extended ESBL NEGATIVE Sensitive     * 2,000 COLONIES/mL ESCHERICHIA COLI   Enterococcus faecalis - MIC*    AMPICILLIN <=2 SENSITIVE Sensitive     LEVOFLOXACIN 2 SENSITIVE Sensitive     NITROFURANTOIN <=16 SENSITIVE Sensitive     VANCOMYCIN 1 SENSITIVE Sensitive     * 20,000 COLONIES/mL ENTEROCOCCUS FAECALIS  Culture, blood (routine x 2)     Status: None   Collection Time: 12/17/18 10:13 AM   Specimen: BLOOD RIGHT ARM  Result Value Ref Range Status   Specimen Description BLOOD RIGHT ARM  Final   Special Requests   Final    AEROBIC BOTTLE ONLY Blood Culture results may not be optimal due to an inadequate volume of blood received in culture bottles   Culture   Final    NO GROWTH 5 DAYS Performed at [place] [address] [place], [place] [address]    Report Status 12/22/2018 FINAL  Final  Culture, blood (routine x 2)     Status: None   Collection Time: 12/17/18 10:18 AM   Specimen: BLOOD LEFT ARM  Result Value Ref Range Status   Specimen Description BLOOD LEFT ARM  Final   Special Requests AEROBIC BOTTLE ONLY Blood Culture adequate volume  Final   Culture   Final    NO GROWTH 5 DAYS Performed at [facility], [place] [address] [place],  [address]    Report Status 12/22/2018 FINAL  Final      Studies: Ct Angio Head W Or Wo Contrast  Result Date: 12/21/2018 CLINICAL DATA:  53 year old female with vertigo, right side hemianopia and headache. Medial left occipital lobe infarct on brain MRI earlier today. EXAM: CT ANGIOGRAPHY HEAD AND NECK TECHNIQUE: Multidetector CT imaging of the head and neck was performed using the standard protocol during bolus administration of intravenous contrast. Multiplanar CT image reconstructions and MIPs were obtained to evaluate the vascular anatomy. Carotid stenosis measurements (when applicable) are obtained utilizing NASCET criteria, using the distal internal carotid diameter as the denominator. CONTRAST:  66mL OMNIPAQUE IOHEXOL 350 MG/ML SOLN COMPARISON:  Brain MRI earlier today. FINDINGS: CTA NECK Skeleton: Poor mandibular dentition. Absent maxillary dentition. No acute osseous abnormality identified. Upper chest: Mosaic attenuation in the upper lungs probably due to mild gas trapping. No superior mediastinal lymphadenopathy. Other neck: Small calcified left thyroid nodule, does not meet size criteria for follow-up. No acute finding in the neck. Aortic arch: Calcified aortic atherosclerosis. Three vessel arch configuration. Right carotid system: No brachiocephalic artery or right CCA origin stenosis despite plaque. There appears to be bulky soft  plaque along the proximal right CCA at the level of the thyroid (series 7, image 131) without stenosis. Calcified plaque then in the more distal right CCA and at the right carotid bifurcation. No significant stenosis at the bifurcation, but soft and calcified plaque continues through the bulb, and there is high-grade stenosis numerically estimated at 70-80% just distal to the bulb on series 7, image 95 and series 11, image 68. The right ICA remains patent to the skull base. Left carotid system: No left CCA origin stenosis despite  abundant plaque. Medial soft plaque in the vessel at the larynx without stenosis. Calcified plaque approaching the left carotid bifurcation. No left ICA origin stenosis, but continued plaque in the vessel resulting in bulb stenosis estimated at 60 % with respect to the distal vessel (series 7, image 100). The vessel remains patent to the skull base. Vertebral arteries: Proximal right subclavian plaque without significant stenosis. Right vertebral artery origin plaque with mild stenosis. Patent right vertebral artery to the skull base without additional stenosis. Bulky calcified plaque at the left subclavian artery origin with high-grade stenosis on series 9, image 304. The left subclavian remains patent. Calcified plaque at the left vertebral artery origin resulting in moderate stenosis on series 9, image 275. The left vertebral artery is diminutive and irregular with intermittent V2 calcified plaque but remains patent to the skull base. CTA HEAD Posterior circulation: Diminutive and irregular bilateral distal vertebral arteries. Severe stenosis of the left V4 segment. The left vertebral is occluded beyond the left PICA origin which remains patent. The right V4 segment is diminutive but patent without stenosis and supplies the basilar. The basilar artery is diminutive with mild stenosis proximal to the tip. The bilateral AICA and SCA origins are patent, but there are fetal type bilateral PCA origins. The right PCA is patent but diminutive and irregular. See series 14, image 19. The left PCA is occluded at the bifurcation (P3 segment series 14, image 25). Some of the inferior left PCA division remains patent. Anterior circulation: Both ICA siphons are patent but heavily calcified. On the left there is up to moderate cavernous and supraclinoid segment stenosis. Normal left posterior communicating artery origin. On the right there is mild siphon stenosis. Patent carotid termini. Patent MCA and ACA origins. The left A1  is dominant. The left ACA A1 segment is dominant but the right A2 segment is dominant. Anterior communicating artery is within normal limits. No definite ACA branch stenosis. Left MCA M1 and bifurcation are patent without stenosis. Left MCA branches are within normal limits. Right MCA M1 and bifurcation are patent without stenosis. Right MCA branches are within normal limits. Venous sinuses: Early contrast timing, not evaluated. Anatomic variants: Fetal type bilateral PCA origins. Dominant left A1 and right A2. Review of the MIP images confirms the above findings IMPRESSION: 1. 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 2. Positive also for widespread atherosclerosis and stenosis in the head and neck: - Severe Left Subclavian Artery origin stenosis. - Severe Right ICA bulb stenosis (estimated at 70-80%). - diminutive Left Vertebral Artery which occludes beyond the left PICA origin, and is moderately stenotic at its origin. - Left ICA bulb 60% stenosis. - heavily calcified ICA siphons with moderate stenosis on the Left. - diminutive Basilar Artery with mild stenosis. 3.  Aortic Atherosclerosis (ICD10-I70.0). 4. Carious dentition. Electronically Signed   By: Genevie Ann M.D.   On: 12/21/2018 22:55   Ct  Angio Neck W Or Wo Contrast  Result Date: 12/21/2018 CLINICAL DATA:  53 year old female with vertigo, right side hemianopia and headache. Medial left occipital lobe infarct on brain MRI earlier today. EXAM: CT ANGIOGRAPHY HEAD AND NECK TECHNIQUE: Multidetector CT imaging of the head and neck was performed using the standard protocol during bolus administration of intravenous contrast. Multiplanar CT image reconstructions and MIPs were obtained to evaluate the vascular anatomy. Carotid stenosis measurements (when applicable) are obtained utilizing NASCET criteria, using the distal internal carotid diameter as the denominator. CONTRAST:  49mL OMNIPAQUE  IOHEXOL 350 MG/ML SOLN COMPARISON:  Brain MRI earlier today. FINDINGS: CTA NECK Skeleton: Poor mandibular dentition. Absent maxillary dentition. No acute osseous abnormality identified. Upper chest: Mosaic attenuation in the upper lungs probably due to mild gas trapping. No superior mediastinal lymphadenopathy. Other neck: Small calcified left thyroid nodule, does not meet size criteria for follow-up. No acute finding in the neck. Aortic arch: Calcified aortic atherosclerosis. Three vessel arch configuration. Right carotid system: No brachiocephalic artery or right CCA origin stenosis despite plaque. There appears to be bulky soft plaque along the proximal right CCA at the level of the thyroid (series 7, image 131) without stenosis. Calcified plaque then in the more distal right CCA and at the right carotid bifurcation. No significant stenosis at the bifurcation, but soft and calcified plaque continues through the bulb, and there is high-grade stenosis numerically estimated at 70-80% just distal to the bulb on series 7, image 95 and series 11, image 68. The right ICA remains patent to the skull base. Left carotid system: No left CCA origin stenosis despite abundant plaque. Medial soft plaque in the vessel at the larynx without stenosis. Calcified plaque approaching the left carotid bifurcation. No left ICA origin stenosis, but continued plaque in the vessel resulting in bulb stenosis estimated at 60 % with respect to the distal vessel (series 7, image 100). The vessel remains patent to the skull base. Vertebral arteries: Proximal right subclavian plaque without significant stenosis. Right vertebral artery origin plaque with mild stenosis. Patent right vertebral artery to the skull base without additional stenosis. Bulky calcified plaque at the left subclavian artery origin with high-grade stenosis on series 9, image 304. The left subclavian remains patent. Calcified plaque at the left vertebral artery origin  resulting in moderate stenosis on series 9, image 275. The left vertebral artery is diminutive and irregular with intermittent V2 calcified plaque but remains patent to the skull base. CTA HEAD Posterior circulation: Diminutive and irregular bilateral distal vertebral arteries. Severe stenosis of the left V4 segment. The left vertebral is occluded beyond the left PICA origin which remains patent. The right V4 segment is diminutive but patent without stenosis and supplies the basilar. The basilar artery is diminutive with mild stenosis proximal to the tip. The bilateral AICA and SCA origins are patent, but there are fetal type bilateral PCA origins. The right PCA is patent but diminutive and irregular. See series 14, image 19. The left PCA is occluded at the bifurcation (P3 segment series 14, image 25). Some of the inferior left PCA division remains patent. Anterior circulation: Both ICA siphons are patent but heavily calcified. On the left there is up to moderate cavernous and supraclinoid segment stenosis. Normal left posterior communicating artery origin. On the right there is mild siphon stenosis. Patent carotid termini. Patent MCA and ACA origins. The left A1 is dominant. The left ACA A1 segment is dominant but the right A2 segment is dominant. Anterior communicating artery is  within normal limits. No definite ACA branch stenosis. Left MCA M1 and bifurcation are patent without stenosis. Left MCA branches are within normal limits. Right MCA M1 and bifurcation are patent without stenosis. Right MCA branches are within normal limits. Venous sinuses: Early contrast timing, not evaluated. Anatomic variants: Fetal type bilateral PCA origins. Dominant left A1 and right A2. Review of the MIP images confirms the above findings IMPRESSION: 1. 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 2. Positive also for widespread atherosclerosis and stenosis in the head  and neck: - Severe Left Subclavian Artery origin stenosis. - Severe Right ICA bulb stenosis (estimated at 70-80%). - diminutive Left Vertebral Artery which occludes beyond the left PICA origin, and is moderately stenotic at its origin. - Left ICA bulb 60% stenosis. - heavily calcified ICA siphons with moderate stenosis on the Left. - diminutive Basilar Artery with mild stenosis. 3.  Aortic Atherosclerosis (ICD10-I70.0). 4. Carious dentition. Electronically Signed   By: Genevie Ann M.D.   On: 12/21/2018 22:55   Mr Brain Wo Contrast  Result Date: 12/21/2018 CLINICAL DATA:  Vertigo.  Right-sided hemianopia.  Headache. EXAM: MRI HEAD WITHOUT CONTRAST TECHNIQUE: Multiplanar, multiecho pulse sequences of the brain and surrounding structures were obtained without intravenous contrast. COMPARISON:  CT head 08/19/2007 FINDINGS: Brain: Acute/subacute infarct in the left medial occipital lobe. No associated hemorrhage. No other acute infarct. Scattered white matter hyperintensities bilaterally compatible chronic ischemia, moderate in degree. Patchy hyperintensity in the pons bilaterally. Small chronic infarct head of caudate on the right. Chronic infarct left internal capsule and left external capsule. Ventricle size normal.  Negative for hemorrhage or mass. Vascular: Normal arterial flow voids. Skull and upper cervical spine: Negative Sinuses/Orbits: Negative Other: None IMPRESSION: Acute/subacute infarct left medial occipital lobe without hemorrhage Moderate chronic microvascular ischemic change. Electronically Signed   By: Franchot Gallo M.D.   On: 12/21/2018 12:25   Vas US Carotid  Result Date: 12/21/2018 Carotid Arterial Duplex Study Indications: CVA. Limitations  Today's exam was limited due to respiratory interference - snoring. Performing Technologist: Toma Copier RVS  Examination Guidelines: A complete evaluation includes B-mode imaging, spectral Doppler, color Doppler, and power Doppler as needed of all  accessible portions of each vessel. Bilateral testing is considered an integral part of a complete examination. Limited examinations for reoccurring indications may be performed as noted.  Right Carotid Findings: +----------+--------+--------+--------+------------------+---------------------+             PSV cm/s EDV cm/s Stenosis Plaque Description Comments               +----------+--------+--------+--------+------------------+---------------------+  CCA Prox   106      14                heterogenous       intiomal thickening                                                              with mild plaque       +----------+--------+--------+--------+------------------+---------------------+  CCA Distal 81       19                heterogenous       intimal thickening  with mild plaque       +----------+--------+--------+--------+------------------+---------------------+  ICA Prox   112      40       1-39%    heterogenous       mild to moderate                                                                 plaque                 +----------+--------+--------+--------+------------------+---------------------+  ICA Mid    224      29       60-79%   heterogenous       There is a narrowing                                                             at the proximal to                                                               mid region velocities                                                            are psv 250, edv 76    +----------+--------+--------+--------+------------------+---------------------+  ICA Distal 207      43       40-59%   heterogenous       mild plaque            +----------+--------+--------+--------+------------------+---------------------+  ECA        88       8                 heterogenous       mild plaque at the                                                               origin                  +----------+--------+--------+--------+------------------+---------------------+ +----------+--------+-------+--------+-------------------+             PSV cm/s EDV cms Describe Arm Pressure (mmHG)  +----------+--------+-------+--------+-------------------+  Subclavian 27       1                                     +----------+--------+-------+--------+-------------------+ +---------+--------+--+--------+--+  Vertebral PSV cm/s 36 EDV cm/s 11  +---------+--------+--+--------+--+  There is a 60% to 79% ICA stenosis at the proximal to mid egion Left Carotid Findings: +----------+--------+--------+--------+--------------------+-------------------+             PSV cm/s EDV cm/s Stenosis Plaque Description   Comments             +----------+--------+--------+--------+--------------------+-------------------+  CCA Prox   125      17                heterogenous         intimal wall                                                                     changes with mild                                                                plaque               +----------+--------+--------+--------+--------------------+-------------------+  CCA Distal 97       22                heterogenous         intimal wall                                                                     changes with mild                                                                plaque               +----------+--------+--------+--------+--------------------+-------------------+  ICA Prox   138      35       1-39%    heterogenous and     mild to moderate                                            irregular            plaque               +----------+--------+--------+--------+--------------------+-------------------+  ICA Mid    174      30       1-39%    heterogenous         mild plaque          +----------+--------+--------+--------+--------------------+-------------------+  ICA Distal 189      53       40-59%   heterogenous  tortuous with mild                                                                plaque               +----------+--------+--------+--------+--------------------+-------------------+  ECA        117      7                                      intimal thickening   +----------+--------+--------+--------+--------------------+-------------------+ +----------+--------+--------+--------+-------------------+             PSV cm/s EDV cm/s Describe Arm Pressure (mmHG)  +----------+--------+--------+--------+-------------------+  Subclavian 58                                              +----------+--------+--------+--------+-------------------+ +---------+--------+--+--------+-+  Vertebral PSV cm/s 37 EDV cm/s 4  +---------+--------+--+--------+-+  Summary: Right Carotid: Velocities in the right ICA are consistent with a 60-79%                stenosis. See technical comments listed above. Left Carotid: Velocities in the left ICA are consistent with a 40-59% stenosis.               See technical comments listed above. Vertebrals:  Bilateral vertebral arteries demonstrate antegrade flow. Subclavians: Normal flow hemodynamics were seen in bilateral subclavian              arteries. *See table(s) above for measurements and observations.     Preliminary     Scheduled Meds:  aspirin  325 mg Oral Daily   atorvastatin  80 mg Oral q1800   cefdinir  300 mg Oral Q12H   Chlorhexidine Gluconate Cloth  6 each Topical Daily   heparin  5,000 Units Subcutaneous Q8H   insulin aspart  0-5 Units Subcutaneous QHS   insulin aspart  0-9 Units Subcutaneous TID WC   insulin glargine  15 Units Subcutaneous QHS   mouth rinse  15 mL Mouth Rinse BID   opium-belladonna  1 suppository Rectal Once   oxybutynin  2.5 mg Oral TID   polyethylene glycol  17 g Oral Daily   senna-docusate  2 tablet Oral BID   sodium chloride flush  3 mL Intravenous Once    Continuous Infusions:  sodium chloride 250 mL (12/15/18 1816)   sodium chloride 50 mL/hr at  12/21/18 1656     LOS: 7 days     Kayleen Memos, MD [facility] Pager [phone]  If 7PM-7AM, please contact night-coverage [email or web address] Password [email or web address] 12/22/2018, 9:40 AM

## 2018-12-22 NOTE — Progress Notes (Signed)
    [facility] has been requested to perform a transesophageal echocardiogram on Pamela Carney for bacteremia.  After careful review of history and examination, the risks and benefits of transesophageal echocardiogram have been explained including risks of esophageal damage, perforation (1:10,000 risk), bleeding, pharyngeal hematoma as well as other potential complications associated with conscious sedation including aspiration, arrhythmia, respiratory failure and death. Alternatives to treatment were discussed, questions were answered. Patient is willing to proceed.   Taryn Shellhammer Ninfa Meeker, PA-C  12/22/2018 1:14 PM

## 2018-12-22 NOTE — Consult Note (Addendum)
Hospital Consult    Reason for Consult:  Symptomatic carotid stenosis Requesting Physician:  Nevada Crane MRN #:  [id number]  History of Present Illness: This is a 53 y.o. 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    She had been c/o about the right eye.  She states that it has the appearance of a kaleidoscope and is constant.  This has been present for about 2 weeks.  She states it started about a week before she was admitted.  She states that she also has some numbness and tingling in the fingers of the left hand that has been going on for about the same time as her eye issues.  She denies any issues with the left leg, but states she does have neuropathy.   She states she does get cramping in her legs when she walks.  She states that she does not think she can walk a city block without cramping.  When she stops, the cramping gets better but still achy.  She states she had some non healing wounds on the left foot a couple years ago and took about 6 months to heal.  No issues with wounds since.      An ophthalmology consult was obtained.  An MRI was obtained and found to have an acute/subacute infarct in the right medial occipital lobe.   She had a carotid duplex yesterday that revealed 60-79% right carotid stenosis and left 40-59% carotid stenosis. .  The vertebrals had antegrade flow and subclavians had normal flow hemodynamics.    VVS is consulted for symptomatic carotid artery stenosis.   The pt is on a statin for cholesterol management.  The pt is on a daily aspirin.   Other AC:  Sq heparin The pt is not on meds for hypertension.   The pt is diabetic.   Tobacco hx:  current  Past Medical History:  Diagnosis Date   Abnormal Pap smear    AKI (acute kidney injury) ([place]) 12/28/2017   Anxiety    Arthritis    bil knees, neck   Bipolar 1 disorder (HCC)    Cataract    Mild    Cholelithiasis 12/23/2017   noted on CT renal, pt unaware   Chronic kidney disease (CKD), stage III (moderate) (HCC)    Cystitis    Diabetes mellitus without complication (HCC)    Genital warts    Hx of genital   GERD (gastroesophageal reflux disease)    Heart murmur    childhood   Hematuria    History of blood transfusion    History of ectopic pregnancy    History of gestational diabetes    History of kidney stones    History of sepsis    after ectopic pregnancy   Hypertension    Idiopathic peripheral neuropathy    both feet   Leg ulcer, left (HCC)    Low back pain    Migraines    Obesity    Oral candidiasis    Pneumonia    PONV (postoperative nausea and vomiting)    prolonged sedation   Recurrent UTI    Renal calculi 12/23/2017   Multiple bilateral nonobstructing, 2.2 cm lower pole partial staghorn on left, noted on CT renal   Sigmoid diverticulosis 12/23/2017   noted on CT renal, pt unaware   Ulcer of foot ([place])    Left   Weakness     Past Surgical  History:  Procedure Laterality Date   CERVICAL CONE BIOPSY     CESAREAN SECTION     x3   CYSTOSCOPY W/ URETERAL STENT PLACEMENT Right 12/15/2018   Procedure: Cystoscopy, right retrograde ureteropyelogram, fluoroscopic interpretation, right double-J stent placement (24 cm x 6 [place]);  Surgeon: Franchot Gallo, MD;  Location: [place];  Service: Urology;  Laterality: Right;   CYSTOSCOPY/URETEROSCOPY/HOLMIUM LASER/STENT PLACEMENT Right 02/04/2018   Procedure: CYSTOSCOPY/URETEROSCOPY/RETROGRADE PYELOGRAM/HOLMIUM LASER/STENT PLACEMENT;  Surgeon: Alexis Frock, MD;  [facility];  Service: Urology;  Laterality: Right;   CYSTOSCOPY/URETEROSCOPY/HOLMIUM LASER/STENT PLACEMENT Right 02/07/2018   Procedure: CYSTOSCOPY LEFT STENT EXCHANGE;  Surgeon: Alexis Frock, MD;  [facility];  Service: Urology;  Laterality: Right;   DILATION AND CURETTAGE OF UTERUS     ECTOPIC PREGNANCY SURGERY       IR NEPHROSTOMY PLACEMENT LEFT  12/25/2017   NEPHROLITHOTOMY Left 02/04/2018   Procedure: NEPHROLITHOTOMY PERCUTANEOUS;  Surgeon: Alexis Frock, MD;  Location[facility];  Service: Urology;  Laterality: Left;  3 HRS   NEPHROLITHOTOMY Left 02/07/2018   Procedure: SECOND LOOK NEPHROLITHOTOMY PERCUTANEOUS;  Surgeon: Alexis Frock, MD;  [facility];  Service: Urology;  Laterality: Left;  2 HRS   OVARIAN CYST REMOVAL     UNILATERAL SALPINGECTOMY     Pt unsure of which fallopian tube was removed   WISDOM TOOTH EXTRACTION      Allergies  Allergen Reactions   Sulfa Antibiotics Hives and Swelling    Swelling site not recalled   Latex Rash   Tape Rash    Not tolerated well    Prior to Admission medications   Medication Sig Start Date End Date Taking? Authorizing Provider  insulin degludec (TRESIBA) 100 UNIT/ML SOPN FlexTouch Pen Inject 0.2 mLs (20 Units total) into the skin daily. 02/02/18  Yes Danford, Berna Spare, NP  levonorgestrel (MIRENA) 20 MCG/24HR IUD 1 each by Intrauterine route once.   Yes [provider]  metFORMIN (GLUCOPHAGE) 500 MG tablet Take 1 tablet (500 mg total) by mouth 2 (two) times daily with a meal. PATIENT MUST HAVE OFFICE VISIT PRIOR TO ANY FURTHER REFILLS! 09/05/18  Yes Danford, Berna Spare, NP  Multiple Vitamins-Minerals (ONE-A-DAY WOMENS 50+ ADVANTAGE) TABS Take 1 tablet by mouth daily.   Yes [provider]  Blood Glucose Monitoring Suppl (ONETOUCH VERIO) w/Device KIT Use to check blood sugars every morning fasting and 2 hours after largest meal 02/02/18   Danford, Katy D, NP  glucose blood (ONETOUCH VERIO) test strip Use to check blood sugars every morning fasting and 2 hours after largest meal 02/02/18   Danford, Katy D, NP  Insulin Pen Needle (PEN NEEDLES) 31G X 8 MM MISC 1 each by Does not apply route daily. 02/23/18   Esaw Grandchild, NP  ONE TOUCH LANCETS MISC Use to check blood sugars every morning fasting and 2 hours after largest meal  02/02/18   Danford, Berna Spare, NP    Social History   Socioeconomic History   Marital status: Married    Spouse name: Not on file   Number of children: Not on file   Years of education: Not on file   Highest education level: Not on file  Occupational History   Not on file  Social Needs   Financial resource strain: Not on file   Food insecurity    Worry: Not on file    Inability: Not on file   Transportation needs    Medical: Not on file    Non-medical: Not  on file  Tobacco Use   Smoking status: Current Every Day Smoker    Packs/day: 1.00    Years: 37.00    Pack years: 37.00    Types: Cigarettes   Smokeless tobacco: Never Used  Substance and Sexual Activity   Alcohol use: No    Alcohol/week: 0.0 standard drinks   Drug use: No   Sexual activity: Not Currently    Birth control/protection: I.U.D.  Lifestyle   Physical activity    Days per week: Not on file    Minutes per session: Not on file   Stress: Not on file  Relationships   Social connections    Talks on phone: Not on file    Gets together: Not on file    Attends religious service: Not on file    Active member of club or organization: Not on file    Attends meetings of clubs or organizations: Not on file    Relationship status: Not on file   Intimate partner violence    Fear of current or ex partner: Not on file    Emotionally abused: Not on file    Physically abused: Not on file    Forced sexual activity: Not on file  Other Topics Concern   Not on file  Social History Narrative   Not on file     Family History  Problem Relation Age of Onset   Diabetes Paternal Grandfather    COPD Paternal Grandfather    Heart disease Paternal Grandfather    Cancer Paternal Grandfather        bone   Cancer Paternal Grandmother    Heart disease Paternal Grandmother    Cancer Father    Hypertension Father    Heart attack Father    Alcohol abuse Father    Depression Father    Heart  failure Mother    Diabetes Mother    Heart disease Mother    Hyperlipidemia Mother    Hypertension Mother    Heart attack Mother    Peripheral vascular disease Mother    Depression Mother    COPD Mother    Hypertension Sister    Heart attack Sister    Stroke Maternal Grandmother     ROS: _0  Positive   _1  Negative   _2  All sytems reviewed and are negative  Cardiac: _3  hx of high blood pressure   Vascular: _4  pain in legs while walking _5  non-healing ulcers-hx of  Pulmonary: _6  productive cough _7  asthma/wheezing _8  home O2  Neurologic: _9  weakness in _10  arms _11  legs _12  numbness in _13  arms _14  legs _15  hx of CVA _16  mini stroke _17 difficulty speaking or slurred speech _18  temporary loss of vision in one eye _19  dizziness  Hematologic: _20  recent E coli bacteremia from UTI  Endocrine:   _21  diabetes _22  thyroid disease  GI _23  vomiting blood _24  blood in stool  GU: _25  CKD/renal failure _26  HD--_27  M/W/F or _28  T/T/S _29  recent UTI   Psychiatric: _30  bipolar  Musculoskeletal: _31  back pain  _32  arthritis   Integumentary: _33  rashes _34  ulcers  Constitutional: _35  fever before admission _36  chills   Physical Examination  Vitals:   12/22/18 0451 12/22/18 0800  BP: (!) 91/49 (!) 87/59  Pulse: 98 69  Resp: 18   Temp: 98.7 F (37.1 C) 97.6 F (36.4 C)  SpO2: 95% 93%   Body mass index is 39.2 kg/m.  General:  WDWN in NAD Gait: Not observed HENT: WNL, normocephalic Pulmonary:  normal non-labored breathing, without Rales, rhonchi,  wheezing Cardiac: regular, without  Murmurs, rubs or gallops; without carotid bruits Abdomen:  soft, obese NT/ND, no masses Skin: without rashes Vascular Exam/Pulses:  Right Left  Radial 2+ (normal) absent  Ulnar Unable to palpate  absent  Femoral Unable to palpate  2+ (normal)  Popliteal Unable to palpate  Unable to palpate   DP Unable to palpate  Unable to palpate   PT Unable to palpate  Unable to  palpate    Extremities: without ischemic changes, without Gangrene , without cellulitis; without open wounds;  Musculoskeletal: no muscle wasting or atrophy  Neurologic: A&O X 3;  No focal weakness or paresthesias are detected; speech is fluent/normal Psychiatric:  The pt has Normal affect. Lymph:  No inguinal lymphadenopathy appreciated.   CBC    Component Value Date/Time   WBC 11.6 (H) 12/22/2018 0823   RBC 3.83 (L) 12/22/2018 0823   HGB 12.1 12/22/2018 0823   HCT 39.3 12/22/2018 0823   HCT 28.8 (L) 12/16/2018 0741   PLT 420 (H) 12/22/2018 0823   MCV 102.6 (H) 12/22/2018 0823   MCH 31.6 12/22/2018 0823   MCHC 30.8 12/22/2018 0823   RDW 14.9 12/22/2018 0823   RDW 13.8 10/05/2013 1532   LYMPHSABS 0.6 (L) 12/15/2018 0757   LYMPHSABS 2.8 10/05/2013 1532   MONOABS 0.9 12/15/2018 0757   EOSABS 0.0 12/15/2018 0757   EOSABS 0.1 10/05/2013 1532   BASOSABS 0.0 12/15/2018 0757   BASOSABS 0.0 10/05/2013 1532    BMET    Component Value Date/Time   NA 138 12/22/2018 0823   NA 139 10/05/2013 1532   K 4.3 12/22/2018 0823   CL 102 12/22/2018 0823   CO2 23 12/22/2018 0823   GLUCOSE 128 (H) 12/22/2018 0823   BUN 23 (H) 12/22/2018 0823   BUN 23 10/05/2013 1532   CREATININE 1.34 (H) 12/22/2018 0823   CALCIUM 9.2 12/22/2018 0823   GFRNONAA 45 (L) 12/22/2018 0823   GFRAA 53 (L) 12/22/2018 0823    COAGS: Lab Results  Component Value Date   INR 1.1 12/15/2018   INR 1.38 12/23/2017     Non-Invasive Vascular Imaging:   Carotid duplex 12/21/2018: Summary: Right Carotid: Velocities in the right ICA are consistent with a 60-79%                stenosis. See technical comments listed above. Left Carotid: Velocities in the left ICA are consistent with a 40-59% stenosis.               See technical comments listed above.  Vertebrals:  Bilateral vertebral arteries demonstrate antegrade flow. Subclavians: Normal flow hemodynamics were seen in bilateral subclavian               arteries.  CTA head/neck 12/21/2018: IMPRESSION: 1. 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 2. Positive also for widespread atherosclerosis and stenosis in the head and neck: - Severe Left Subclavian Artery origin stenosis. - Severe Right ICA bulb stenosis (estimated at 70-80%). - diminutive Left Vertebral Artery which occludes beyond the left PICA origin, and is moderately stenotic at its origin. - Left ICA bulb 60% stenosis. - heavily calcified ICA siphons with moderate stenosis on the Left. - diminutive Basilar Artery with mild stenosis. 3.  Aortic Atherosclerosis (ICD10-I70.0). 4. Carious dentition.  MRI brain 12/21/2018:  IMPRESSION: Acute/subacute infarct left medial occipital lobe without hemorrhage Moderate chronic microvascular ischemic change  CT renal study 12/15/2018: IMPRESSION: 1. 10 mm obstructive stone within the distal right ureter with secondary moderate left hydroureteronephrosis. 2. Additional bilateral nonobstructive nephrolithiasis as above. 3. Colonic diverticulosis without evidence for acute diverticulitis. 4. Mildly enlarged periaortic and aortocaval adenopathy within the upper abdomen, mildly increased from previous. Finding is indeterminate, but could be reactive in nature. Attention at follow-up. 5. Moderate aorto bi-iliac atherosclerotic disease.   ASSESSMENT/PLAN: This is a 53 y.o. female with symptomatic right carotid artery stenosis  -pt with right eye visual disturbance and left hand numbness and tingling in her fingers that started about the same time as her visual disturbance.  Her carotid duplex reveals 50-69% and CTA reveals 70-80% severe right ICA stenosis at the bulb.   Pt will need TCAR vs open carotid endarterectomy most likely next week.   -she does also have a moderate stenosis on the left.  She has severe left subclavian artery stenosis and she does not have a  palpable left radial or ulnar pulse.   -pt does have CKD III with creatinine of 1.34.  Pt receiving gentle IVF for hydration.  She has resolving sepsis with recent shock secondary to complicated Enterococcus faecalis/E coli UTI in setting of right distal ureteral calculus with pyelonephrosis and E coli bacteremia and has completed 5 days of rocephin and now on Keflex 521m tid x 5 days that started 12/20/2018.   -pt does have moderate aorto-iliac disease based on CT of the abdomen on9/24/2020.  By her description, she does have bilateral claudication.  She did have some non healing wounds in the past on the left foot that took about 6 months to heal.  These did eventually heal.  She is a current smoker and I discussed with her the importance of smoking cessation and that with her aorto-occlusive disease, carotid disease, combined with her diabetes, that she is a high risk for future stroke, heart attack, lung cancer and possible limb loss in the future.  May benefit from getting baseline ABI's.  -There was periaortic and aortocaval adenopathy-per primary team.   SLeontine Locket PA-C [facility] [phone] I have independently interviewed and examined patient and agree with PA assessment and plan above.  Stroke demonstrated by MRI with persistent right visual field deficit. Stroke work-up ongoing.  Discussed with Dr. sAndres Egeand stroke is left occipital unlikely related to carotid artery stenosis.  She does have significant calcifications throughout her arterial tree demonstrated on CTA and will need follow-up.  Medical therapy from a vascular standpoint for now and can follow-up in 6 months with carotid duplex in our office.  Laelia Angelo C. CDonzetta Matters MD [facility] [place]: [phone]: [phone]

## 2018-12-22 NOTE — Evaluation (Signed)
Clinical/Bedside Swallow Evaluation Patient Details  Name: Pamela Carney MRN: [id number] Date of Birth: 09/29/1965  53 Time: SLP Start Time (ACUTE ONLY): 63 SLP Stop Time (ACUTE ONLY): 0941 SLP Time Calculation (min) (ACUTE ONLY): 9 min  Past Medical History:  Past Medical History:  Diagnosis Date  . Abnormal Pap smear   . AKI (acute kidney injury) ([place]) 12/28/2017  . Anxiety   . Arthritis    bil knees, neck  . Bipolar 1 disorder ([place])   . Cataract    Mild  . Cholelithiasis 12/23/2017   noted on CT renal, pt unaware  . Chronic kidney disease (CKD), stage III (moderate) (HCC)   . Cystitis   . Diabetes mellitus without complication ([place])   . Genital warts    Hx of genital  . GERD (gastroesophageal reflux disease)   . Heart murmur    childhood  . Hematuria   . History of blood transfusion   . History of ectopic pregnancy   . History of gestational diabetes   . History of kidney stones   . History of sepsis    after ectopic pregnancy  . Hypertension   . Idiopathic peripheral neuropathy    both feet  . Leg ulcer, left ([place])   . Low back pain   . Migraines   . Obesity   . Oral candidiasis   . Pneumonia   . PONV (postoperative nausea and vomiting)    prolonged sedation  . Recurrent UTI   . 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 Sigmoid diverticulosis 12/23/2017   noted on CT renal, pt unaware  . Ulcer of foot ([place])    Left  . Weakness    Past Surgical History:  Past Surgical History:  Procedure Laterality Date  . CERVICAL CONE BIOPSY    . CESAREAN SECTION     x3  . CYSTOSCOPY W/ URETERAL STENT PLACEMENT Right 12/15/2018   Procedure: Cystoscopy, right retrograde ureteropyelogram, fluoroscopic interpretation, right double-J stent placement (24 cm x 6 [place]);  Surgeon: Franchot Gallo, MD;  Location: [facility];  Service: Urology;  Laterality: Right;  .  CYSTOSCOPY/URETEROSCOPY/HOLMIUM LASER/STENT PLACEMENT Right 02/04/2018   Procedure: CYSTOSCOPY/URETEROSCOPY/RETROGRADE PYELOGRAM/HOLMIUM LASER/STENT PLACEMENT;  Surgeon: Alexis Frock, MD;  [facility];  Service: Urology;  Laterality: Right;  . CYSTOSCOPY/URETEROSCOPY/HOLMIUM LASER/STENT PLACEMENT Right 02/07/2018   Procedure: CYSTOSCOPY LEFT STENT EXCHANGE;  Surgeon: Alexis Frock, MD;  [facility];  Service: Urology;  Laterality: Right;  . DILATION AND CURETTAGE OF UTERUS    . ECTOPIC PREGNANCY SURGERY    . IR NEPHROSTOMY PLACEMENT LEFT  12/25/2017  . NEPHROLITHOTOMY Left 02/04/2018   Procedure: NEPHROLITHOTOMY PERCUTANEOUS;  Surgeon: Alexis Frock, MD;  [facility];  Service: Urology;  Laterality: Left;  3 HRS  . NEPHROLITHOTOMY Left 02/07/2018   Procedure: SECOND LOOK NEPHROLITHOTOMY PERCUTANEOUS;  Surgeon: Alexis Frock, MD;  [facility];  Service: Urology;  Laterality: Left;  2 HRS  . OVARIAN CYST REMOVAL    . UNILATERAL SALPINGECTOMY     Pt unsure of which fallopian tube was removed  . WISDOM TOOTH EXTRACTION     HPI:  53 yr old obese F w/ PMHx of Bipolar Type 1, DM, CKD stage III, HTN, smoker (37 pack years),  Nephrolithiasis including staghorn calculus that required nephrostomy tubes, presented to [facility] w/ complaints of abdominal pain accompanied with nausea and vomiting. Per EDP documentation the patient stated  that these symptoms began three days ago and have become progressively worse. She also reported subjective fevers, and a non productive cough. Hospital course complicated by persistent right eye discomfort which prompted ophthalmology consult.  Recommendation for MRI brain.  Completed on 12/21/2018, showed acute/subacute ischemic CVA involving left medial occipital lobe.   Assessment / Plan / Recommendation Clinical Impression  Pt presents with functional swallowing as assessed clinically.  Pt tolerated all consistencies trialed with no clinical s/s of  aspiration and exhibited good oral clearance of regular solids.   Recommend regular texture diet with thin liquids.    Pt denies changes other than visual and was able to participate in conversation without difficulty.  Speech is clear, no dysarthria or word finding difficulties noted. Pt has no further ST needs.  SLP will sign off. SLP Visit Diagnosis: Dysphagia, unspecified (R13.10)    Aspiration Risk  No limitations    Diet Recommendation Regular;Thin liquid   Liquid Administration via: Straw;Cup Medication Administration: Whole meds with liquid Supervision: Patient able to self feed Compensations: Slow rate;Small sips/bites Postural Changes: Seated upright at 90 degrees    Other  Recommendations Oral Care Recommendations: Oral care BID   Follow up Recommendations   N/A     Frequency and Duration   N/A         Prognosis   N/A     Swallow Study   General HPI: 53 yr old obese F w/ PMHx of Bipolar Type 1, DM, CKD stage III, HTN, smoker (37 pack years),  Nephrolithiasis including staghorn calculus that required nephrostomy tubes, presented to [facility] w/ complaints of abdominal pain accompanied with nausea and vomiting. Per EDP documentation the patient stated that these symptoms began three days ago and have become progressively worse. She also reported subjective fevers, and a non productive cough. Hospital course complicated by persistent right eye discomfort which prompted ophthalmology consult.  Recommendation for MRI brain.  Completed on 12/21/2018, showed acute/subacute ischemic CVA involving left medial occipital lobe. Type of Study: Bedside Swallow Evaluation Previous Swallow Assessment: none Diet Prior to this Study: Regular Temperature Spikes Noted: No Respiratory Status: Room air History of Recent Intubation: No Behavior/Cognition: Cooperative;Alert;Pleasant mood Oral Cavity Assessment: Within Functional Limits Oral Care Completed by SLP: No Oral Cavity - Dentition:  Missing dentition;Poor condition Vision: Functional for self-feeding Self-Feeding Abilities: Able to feed self Patient Positioning: (Upright edge of bed) Baseline Vocal Quality: Normal Volitional Cough: Strong Volitional Swallow: Able to elicit    Oral/Motor/Sensory Function Overall Oral Motor/Sensory Function: Within functional limits   Ice Chips Ice chips: Not tested   Thin Liquid Thin Liquid: Within functional limits Presentation: Cup;Straw    Nectar Thick Nectar Thick Liquid: Not tested   Honey Thick Honey Thick Liquid: Not tested   Puree Puree: Within functional limits Presentation: Spoon   Solid     Solid: Within functional limits Presentation: [place], [place], [place] Office: [phone] 12/22/2018,10:16 AM

## 2018-12-22 NOTE — Progress Notes (Addendum)
STROKE TEAM PROGRESS NOTE   INTERVAL HISTORY I have personally reviewed history of presenting illness with the patient, electronic medical records and imaging films in PACS.  She presented to the hospital 1 week ago with with kidney stones requiring nephrostomy tubes and developed sepsis with positive blood cultures.  She complained of vision disturbance for which she was found to have homonymous hemianopsia on the right and MRI scan confirms left occipital infarct.  She denies any prior history of strokes TIAs but does have multiple vascular risk factors in the form of diabetes hypertension obesity  Vitals:   12/21/18 2059 12/22/18 0040 12/22/18 0451 12/22/18 0800  BP: (!) 80/56 (!) 95/58 (!) 91/49 (!) 87/59  Pulse: 60  98   Resp: 20  18   Temp: 98 F (36.7 C)  98.7 F (37.1 C)   TempSrc: Oral  Oral   SpO2: 93%  95%   Weight:   97.2 kg   Height:        CBC:  Recent Labs  Lab 12/16/18 0541 12/16/18 0741 12/22/18 0823  WBC 9.3  --  11.6*  HGB 10.2*  --  12.1  HCT 32.1* 28.8* 39.3  MCV 100.6*  --  102.6*  PLT 175  --  420*    Basic Metabolic Panel:  Recent Labs  Lab 12/15/18 2154  12/19/18 0500 12/20/18 0402  NA  --    < > 136 138  K  --    < > 3.5 4.1  CL  --    < > 98 102  CO2  --    < > 31 26  GLUCOSE  --    < > 138* 126*  BUN  --    < > 15 20  CREATININE  --    < > 1.19* 1.27*  CALCIUM  --    < > 8.8* 9.0  MG 2.1  --   --   --   PHOS 3.7  --   --   --    < > = values in this interval not displayed.   Lipid Panel:     Component Value Date/Time   CHOL 183 12/15/2018 0353   TRIG 288 (H) 12/15/2018 0353   HDL 26 (L) 12/15/2018 0353   CHOLHDL 7.0 12/15/2018 0353   VLDL 58 (H) 12/15/2018 0353   LDLCALC 99 12/15/2018 0353   HgbA1c:  Lab Results  Component Value Date   HGBA1C 8.6 (H) 12/15/2018   Urine Drug Screen: No results found for: LABOPIA, COCAINSCRNUR, LABBENZ, AMPHETMU, THCU, LABBARB  Alcohol Level No results found for: ETH  IMAGING Ct Angio Head  W Or Wo Contrast  Result Date: 12/21/2018 CLINICAL DATA:  53 year old female with vertigo, right side hemianopia and headache. Medial left occipital lobe infarct on brain MRI earlier today. EXAM: CT ANGIOGRAPHY HEAD AND NECK TECHNIQUE: Multidetector CT imaging of the head and neck was performed using the standard protocol during bolus administration of intravenous contrast. Multiplanar CT image reconstructions and MIPs were obtained to evaluate the vascular anatomy. Carotid stenosis measurements (when applicable) are obtained utilizing NASCET criteria, using the distal internal carotid diameter as the denominator. CONTRAST:  45mL OMNIPAQUE IOHEXOL 350 MG/ML SOLN COMPARISON:  Brain MRI earlier today. FINDINGS: CTA NECK Skeleton: Poor mandibular dentition. Absent maxillary dentition. No acute osseous abnormality identified. Upper chest: Mosaic attenuation in the upper lungs probably due to mild gas trapping. No superior mediastinal lymphadenopathy. Other neck: Small calcified left thyroid nodule, does not meet size  criteria for follow-up. No acute finding in the neck. Aortic arch: Calcified aortic atherosclerosis. Three vessel arch configuration. Right carotid system: No brachiocephalic artery or right CCA origin stenosis despite plaque. There appears to be bulky soft plaque along the proximal right CCA at the level of the thyroid (series 7, image 131) without stenosis. Calcified plaque then in the more distal right CCA and at the right carotid bifurcation. No significant stenosis at the bifurcation, but soft and calcified plaque continues through the bulb, and there is high-grade stenosis numerically estimated at 70-80% just distal to the bulb on series 7, image 95 and series 11, image 68. The right ICA remains patent to the skull base. Left carotid system: No left CCA origin stenosis despite abundant plaque. Medial soft plaque in the vessel at the larynx without stenosis. Calcified plaque approaching the left  carotid bifurcation. No left ICA origin stenosis, but continued plaque in the vessel resulting in bulb stenosis estimated at 60 % with respect to the distal vessel (series 7, image 100). The vessel remains patent to the skull base. Vertebral arteries: Proximal right subclavian plaque without significant stenosis. Right vertebral artery origin plaque with mild stenosis. Patent right vertebral artery to the skull base without additional stenosis. Bulky calcified plaque at the left subclavian artery origin with high-grade stenosis on series 9, image 304. The left subclavian remains patent. Calcified plaque at the left vertebral artery origin resulting in moderate stenosis on series 9, image 275. The left vertebral artery is diminutive and irregular with intermittent V2 calcified plaque but remains patent to the skull base. CTA HEAD Posterior circulation: Diminutive and irregular bilateral distal vertebral arteries. Severe stenosis of the left V4 segment. The left vertebral is occluded beyond the left PICA origin which remains patent. The right V4 segment is diminutive but patent without stenosis and supplies the basilar. The basilar artery is diminutive with mild stenosis proximal to the tip. The bilateral AICA and SCA origins are patent, but there are fetal type bilateral PCA origins. The right PCA is patent but diminutive and irregular. See series 14, image 19. The left PCA is occluded at the bifurcation (P3 segment series 14, image 25). Some of the inferior left PCA division remains patent. Anterior circulation: Both ICA siphons are patent but heavily calcified. On the left there is up to moderate cavernous and supraclinoid segment stenosis. Normal left posterior communicating artery origin. On the right there is mild siphon stenosis. Patent carotid termini. Patent MCA and ACA origins. The left A1 is dominant. The left ACA A1 segment is dominant but the right A2 segment is dominant. Anterior communicating artery is  within normal limits. No definite ACA branch stenosis. Left MCA M1 and bifurcation are patent without stenosis. Left MCA branches are within normal limits. Right MCA M1 and bifurcation are patent without stenosis. Right MCA branches are within normal limits. Venous sinuses: Early contrast timing, not evaluated. Anatomic variants: Fetal type bilateral PCA origins. Dominant left A1 and right A2. Review of the MIP images confirms the above findings IMPRESSION: 1. 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 2. Positive also for widespread atherosclerosis and stenosis in the head and neck: - Severe Left Subclavian Artery origin stenosis. - Severe Right ICA bulb stenosis (estimated at 70-80%). - diminutive Left Vertebral Artery which occludes beyond the left PICA origin, and is moderately stenotic at its origin. - Left ICA bulb 60% stenosis. - heavily calcified ICA siphons with  moderate stenosis on the Left. - diminutive Basilar Artery with mild stenosis. 3.  Aortic Atherosclerosis (ICD10-I70.0). 4. Carious dentition. Electronically Signed   By: Genevie Ann M.D.   On: 12/21/2018 22:55   Dg Abd 1 View  Result Date: 12/20/2018 CLINICAL DATA:  Right flank pain. Recent ureteral stent placement. Urolithiasis. EXAM: ABDOMEN - 1 VIEW COMPARISON:  None. FINDINGS: Double pigtail right ureteral stent is seen in expected position. No definite radiopaque calculi are seen along the course of the stent. Several radiodensities are seen in the expected region of left kidney, consistent with small left renal calculi. IUD noted in the pelvis. Bowel gas pattern is normal. IMPRESSION: Right ureteral stent in appropriate position. No definite radiopaque ureteral calculi identified. Left nephrolithiasis. Electronically Signed   By: Marlaine Hind M.D.   On: 12/20/2018 15:35   Ct Angio Neck W Or Wo Contrast  Result Date: 12/21/2018 CLINICAL DATA:  53 year old female with vertigo,  right side hemianopia and headache. Medial left occipital lobe infarct on brain MRI earlier today. EXAM: CT ANGIOGRAPHY HEAD AND NECK TECHNIQUE: Multidetector CT imaging of the head and neck was performed using the standard protocol during bolus administration of intravenous contrast. Multiplanar CT image reconstructions and MIPs were obtained to evaluate the vascular anatomy. Carotid stenosis measurements (when applicable) are obtained utilizing NASCET criteria, using the distal internal carotid diameter as the denominator. CONTRAST:  52mL OMNIPAQUE IOHEXOL 350 MG/ML SOLN COMPARISON:  Brain MRI earlier today. FINDINGS: CTA NECK Skeleton: Poor mandibular dentition. Absent maxillary dentition. No acute osseous abnormality identified. Upper chest: Mosaic attenuation in the upper lungs probably due to mild gas trapping. No superior mediastinal lymphadenopathy. Other neck: Small calcified left thyroid nodule, does not meet size criteria for follow-up. No acute finding in the neck. Aortic arch: Calcified aortic atherosclerosis. Three vessel arch configuration. Right carotid system: No brachiocephalic artery or right CCA origin stenosis despite plaque. There appears to be bulky soft plaque along the proximal right CCA at the level of the thyroid (series 7, image 131) without stenosis. Calcified plaque then in the more distal right CCA and at the right carotid bifurcation. No significant stenosis at the bifurcation, but soft and calcified plaque continues through the bulb, and there is high-grade stenosis numerically estimated at 70-80% just distal to the bulb on series 7, image 95 and series 11, image 68. The right ICA remains patent to the skull base. Left carotid system: No left CCA origin stenosis despite abundant plaque. Medial soft plaque in the vessel at the larynx without stenosis. Calcified plaque approaching the left carotid bifurcation. No left ICA origin stenosis, but continued plaque in the vessel resulting in  bulb stenosis estimated at 60 % with respect to the distal vessel (series 7, image 100). The vessel remains patent to the skull base. Vertebral arteries: Proximal right subclavian plaque without significant stenosis. Right vertebral artery origin plaque with mild stenosis. Patent right vertebral artery to the skull base without additional stenosis. Bulky calcified plaque at the left subclavian artery origin with high-grade stenosis on series 9, image 304. The left subclavian remains patent. Calcified plaque at the left vertebral artery origin resulting in moderate stenosis on series 9, image 275. The left vertebral artery is diminutive and irregular with intermittent V2 calcified plaque but remains patent to the skull base. CTA HEAD Posterior circulation: Diminutive and irregular bilateral distal vertebral arteries. Severe stenosis of the left V4 segment. The left vertebral is occluded beyond the left PICA origin which remains patent. The right  V4 segment is diminutive but patent without stenosis and supplies the basilar. The basilar artery is diminutive with mild stenosis proximal to the tip. The bilateral AICA and SCA origins are patent, but there are fetal type bilateral PCA origins. The right PCA is patent but diminutive and irregular. See series 14, image 19. The left PCA is occluded at the bifurcation (P3 segment series 14, image 25). Some of the inferior left PCA division remains patent. Anterior circulation: Both ICA siphons are patent but heavily calcified. On the left there is up to moderate cavernous and supraclinoid segment stenosis. Normal left posterior communicating artery origin. On the right there is mild siphon stenosis. Patent carotid termini. Patent MCA and ACA origins. The left A1 is dominant. The left ACA A1 segment is dominant but the right A2 segment is dominant. Anterior communicating artery is within normal limits. No definite ACA branch stenosis. Left MCA M1 and bifurcation are patent  without stenosis. Left MCA branches are within normal limits. Right MCA M1 and bifurcation are patent without stenosis. Right MCA branches are within normal limits. Venous sinuses: Early contrast timing, not evaluated. Anatomic variants: Fetal type bilateral PCA origins. Dominant left A1 and right A2. Review of the MIP images confirms the above findings IMPRESSION: 1. 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 2. Positive also for widespread atherosclerosis and stenosis in the head and neck: - Severe Left Subclavian Artery origin stenosis. - Severe Right ICA bulb stenosis (estimated at 70-80%). - diminutive Left Vertebral Artery which occludes beyond the left PICA origin, and is moderately stenotic at its origin. - Left ICA bulb 60% stenosis. - heavily calcified ICA siphons with moderate stenosis on the Left. - diminutive Basilar Artery with mild stenosis. 3.  Aortic Atherosclerosis (ICD10-I70.0). 4. Carious dentition. Electronically Signed   By: Genevie Ann M.D.   On: 12/21/2018 22:55   Mr Brain Wo Contrast  Result Date: 12/21/2018 CLINICAL DATA:  Vertigo.  Right-sided hemianopia.  Headache. EXAM: MRI HEAD WITHOUT CONTRAST TECHNIQUE: Multiplanar, multiecho pulse sequences of the brain and surrounding structures were obtained without intravenous contrast. COMPARISON:  CT head 08/19/2007 FINDINGS: Brain: Acute/subacute infarct in the left medial occipital lobe. No associated hemorrhage. No other acute infarct. Scattered white matter hyperintensities bilaterally compatible chronic ischemia, moderate in degree. Patchy hyperintensity in the pons bilaterally. Small chronic infarct head of caudate on the right. Chronic infarct left internal capsule and left external capsule. Ventricle size normal.  Negative for hemorrhage or mass. Vascular: Normal arterial flow voids. Skull and upper cervical spine: Negative Sinuses/Orbits: Negative Other: None IMPRESSION:  Acute/subacute infarct left medial occipital lobe without hemorrhage Moderate chronic microvascular ischemic change. Electronically Signed   By: Franchot Gallo M.D.   On: 12/21/2018 12:25   Vas US Carotid  Result Date: 12/21/2018 Carotid Arterial Duplex Study Indications: CVA. Limitations  Today's exam was limited due to respiratory interference - snoring. Performing Technologist: Toma Copier RVS  Examination Guidelines: A complete evaluation includes B-mode imaging, spectral Doppler, color Doppler, and power Doppler as needed of all accessible portions of each vessel. Bilateral testing is considered an integral part of a complete examination. Limited examinations for reoccurring indications may be performed as noted.  Right Carotid Findings: +----------+--------+--------+--------+------------------+---------------------+           PSV cm/sEDV cm/sStenosisPlaque DescriptionComments              +----------+--------+--------+--------+------------------+---------------------+ CCA Prox  106     14  heterogenous      intiomal thickening                                                       with mild plaque      +----------+--------+--------+--------+------------------+---------------------+ CCA Distal81      19              heterogenous      intimal thickening                                                        with mild plaque      +----------+--------+--------+--------+------------------+---------------------+ ICA Prox  112     40      1-39%   heterogenous      mild to moderate                                                          plaque                +----------+--------+--------+--------+------------------+---------------------+ ICA Mid   224     29      60-79%  heterogenous      There is a narrowing                                                      at the proximal to                                                         mid region velocities                                                     are psv 250, edv 76   +----------+--------+--------+--------+------------------+---------------------+ ICA Distal207     43      40-59%  heterogenous      mild plaque           +----------+--------+--------+--------+------------------+---------------------+ ECA       88      8               heterogenous      mild plaque at the                                                        origin                +----------+--------+--------+--------+------------------+---------------------+ +----------+--------+-------+--------+-------------------+  PSV cm/sEDV cmsDescribeArm Pressure (mmHG) +----------+--------+-------+--------+-------------------+ Subclavian27      1                                  +----------+--------+-------+--------+-------------------+ +---------+--------+--+--------+--+ VertebralPSV cm/s36EDV cm/s11 +---------+--------+--+--------+--+ There is a 60% to 79% ICA stenosis at the proximal to mid egion Left Carotid Findings: +----------+--------+--------+--------+--------------------+-------------------+           PSV cm/sEDV cm/sStenosisPlaque Description  Comments            +----------+--------+--------+--------+--------------------+-------------------+ CCA Prox  125     17              heterogenous        intimal wall                                                              changes with mild                                                         plaque              +----------+--------+--------+--------+--------------------+-------------------+ CCA Distal97      22              heterogenous        intimal wall                                                              changes with mild                                                         plaque               +----------+--------+--------+--------+--------------------+-------------------+ ICA Prox  138     35      1-39%   heterogenous and    mild to moderate                                      irregular           plaque              +----------+--------+--------+--------+--------------------+-------------------+ ICA Mid   174     30      1-39%   heterogenous        mild plaque         +----------+--------+--------+--------+--------------------+-------------------+ ICA Distal189     53      40-59%  heterogenous        tortuous with mild  plaque              +----------+--------+--------+--------+--------------------+-------------------+ ECA       117     7                                   intimal thickening  +----------+--------+--------+--------+--------------------+-------------------+ +----------+--------+--------+--------+-------------------+           PSV cm/sEDV cm/sDescribeArm Pressure (mmHG) +----------+--------+--------+--------+-------------------+ [id number]                                          +----------+--------+--------+--------+-------------------+ +---------+--------+--+--------+-+ VertebralPSV cm/s37EDV cm/s4 +---------+--------+--+--------+-+  Summary: Right Carotid: Velocities in the right ICA are consistent with a 60-79%                stenosis. See technical comments listed above. Left Carotid: Velocities in the left ICA are consistent with a 40-59% stenosis.               See technical comments listed above. Vertebrals:  Bilateral vertebral arteries demonstrate antegrade flow. Subclavians: Normal flow hemodynamics were seen in bilateral subclavian              arteries. *See table(s) above for measurements and observations.     Preliminary     PHYSICAL EXAM Obese middle-aged Caucasian lady not in distress. . Afebrile. Head is nontraumatic. Neck is supple without bruit.     Cardiac exam no murmur or gallop. Lungs are clear to auscultation. Distal pulses are well felt. Neurological Exam ;  Awake  Alert oriented x 3. Normal speech and language diminished attention, registration and recall..eye movements full without nystagmus.fundi were not visualized. Vision acuity   appears normal.  Dense right homonymous hemianopsia.  Hearing is normal. Palatal movements are normal. Face symmetric. Tongue midline. Normal strength, tone, reflexes and coordination. Normal sensation. Gait deferred.  NIH stroke scale 2.  Baseline modified Rankin scale 0 ASSESSMENT/PLAN Pamela Carney is a 53 y.o. female with history of multiple medical problems.  For stroke risk factors patient does have history of hypertension and diabetes who was admitted secondary to nephrolithiasis including staghorn calculi that required a nephrostomy tubes to be placed. She had urospesis w/ positive blood cultures. She has been complaining the right eye vision defect x 1 week. ophthalmologist consulted and found a filed cut. MRI confirmed an acute/subacute infarct in the right medial occipital lobe.    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 If negative, need to look for AF.  MRI  L medial occipital infarct. Moderate small vessel disease.   CTA head & neck L P3 occlusion. Widespread atherosclerosis head and neck (severe L SCA, severe R ICA bulb 70-80%, L VA occlusion, L ICA bulb 60%, moderate L ICA siphon stenosis, diminutive BA stenosis). Aortic atherosclerosis. Carious dentition.   Carotid Doppler  R 50-79%, L 40-59% stenosis   2D Echo EF 55%. No source of embolus   TEE to look for endocarditis. Arranged with [place] for tomorrow.  If positive for PFO (patent foramen ovale), check bilateral lower extremity venous dopplers to rule out DVT as possible source of stroke.   If TEE negative for  endocarditis, a [place] electrophysiologist will consult and consider placement of an  implantable loop recorder to evaluate for atrial fibrillation as etiology of stroke. This has been explained to patient/family by Dr. Leonie Man and they are agreeable.   LDL 99  HgbA1c 8.6  Heparin 5000 units sq tid for VTE prophylaxis  No antithrombotic prior to admission, now on aspirin 325 mg daily. Continue aspirin 81 mg and plavix 75 mg x 3 weeks and then aspirin alone Therapy recommendations:  OP OT  Disposition:  Return home, likely tomorrow  Bilateral Carotid stenosis  CTA head & neck L P3 occlusion. Widespread atherosclerosis head and neck (severe L SCA, severe R ICA bulb 70-80%, L VA occlusion, L ICA bulb 60%, moderate L ICA siphon stenosis, diminutive BA stenosis).   Carotid Doppler  R 50-79%, L 40-59% stenosis   R ICA stenosis not associated with current stroke  [facility] consulted - Dr. Leonie Man discussed with Dr. Donzetta Matters - will follow as an OP  Hypertension  Hx HTN but not on meds at home PTA or in hospital  SBP 80-90s . Permissive hypertension (OK if < 220/120) but gradually normalize in 5-7 days . Long-term BP goal normotensive given ICA stenosis   Hyperlipidemia  Home meds:  No statin  Now on lipitor 80  LDL 99, goal < 70  Continue statin at discharge  Diabetes type II Uncontrolled  HgbA1c 8.6, goal < 7.0  Other Stroke Risk Factors  Cigarette smoker, advised to stop smoking  Morbid Obesity, Body mass index is 39.2 kg/m., recommend weight loss, diet and exercise as appropriate   Family hx stroke (maternal grandmother)  Migraines  Other Active Problems  Septic shock ([place]) - Resolving sepsis with recent septic shock secondary to complicated Enterococcus faecalis/E. coli UTI in the setting of right distal ureteral calculus with pyelonephrosis and E. coli bacteremia  E. coli bacteremia, poa  E. coli bacteremia, poa  Ureteral/bladder  spasm  Vaginal candidiasis  Resolving prerenal AKI with IV fluid hydration  E. coli bacteremia likely from urinary source  Right distal ureteral calculus with pyonephrosis status post right cystoscopy and stent with urology on 12/15/2018  History of type I bipolar disorder  Physical debility: Improving  Moderate aorto bi-iliac atherosclerotic disease  Hospital day # 7  I have personally obtained history,examined this patient, reviewed notes, independently viewed imaging studies, participated in medical decision making and plan of care.ROS completed by me personally and pertinent positives fully documented  I have made any additions or clarifications directly to the above note.  She presented with vision disturbance in setting of kidney stones and sepsis.  Recommend check TEE for endocarditis and if negative may need to consider loop recorder for prolonged cardiac monitoring for paroxysmal A. fib since she has 2 prior episodes of syncopal events in the setting of arrhythmias.  She has moderate right greater than left carotid stenosis but this is asymptomatic and can be followed by vascular surgery as an outpatient with aggressive medical therapy should be tried first.  Continue ongoing stroke work-up.  Aspirin and Plavix for now.  Greater than 50% time during the 35-minute visit was spent on counseling and coordination of care about her embolic stroke and discussion about treatment options as well as sleep apnea and possible participation in the sleep smart study if interested.  Discussed with Dr. Nevada Crane. Antony Contras, MD  Antony Contras, MD Medical Director [place] Pager: [phone] 12/22/2018 4:51 PM   To contact Stroke Continuity provider, please refer to [email or web address] After hours, contact General Neurology

## 2018-12-23 ENCOUNTER — Inpatient Hospital Stay (HOSPITAL_COMMUNITY): Payer: 59

## 2018-12-23 ENCOUNTER — Encounter (HOSPITAL_COMMUNITY)
Admission: EM | Disposition: A | Discharge status: Home / Self Care | Payer: Self-pay | Source: Home / Self Care | Attending: Internal Medicine

## 2018-12-23 ENCOUNTER — Encounter (HOSPITAL_COMMUNITY): Payer: Self-pay | Admitting: *Deleted

## 2018-12-23 DIAGNOSIS — R7881 Bacteremia: Secondary | ICD-10-CM

## 2018-12-23 DIAGNOSIS — I6389 Other cerebral infarction: Secondary | ICD-10-CM

## 2018-12-23 HISTORY — PX: BUBBLE STUDY: SHX6837

## 2018-12-23 HISTORY — PX: TEE WITHOUT CARDIOVERSION: SHX5443

## 2018-12-23 HISTORY — PX: LOOP RECORDER INSERTION: EP1214

## 2018-12-23 LAB — GLUCOSE, CAPILLARY
Glucose-Capillary: 134 mg/dL — ABNORMAL HIGH (ref 70–99)
Glucose-Capillary: 115 mg/dL — ABNORMAL HIGH (ref 70–99)
Glucose-Capillary: 118 mg/dL — ABNORMAL HIGH (ref 70–99)
Glucose-Capillary: 173 mg/dL — ABNORMAL HIGH (ref 70–99)

## 2018-12-23 SURGERY — ECHOCARDIOGRAM, TRANSESOPHAGEAL
Anesthesia: Moderate Sedation

## 2018-12-23 SURGERY — LOOP RECORDER INSERTION

## 2018-12-23 MED ORDER — CLOPIDOGREL BISULFATE 75 MG PO TABS
75.0000 mg | ORAL_TABLET | Freq: Every day | ORAL | Status: DC
  Administered 2018-12-23 – 2018-12-25 (×3): 75 mg via ORAL
  Filled 2018-12-23 (×3): qty 1

## 2018-12-23 MED ORDER — FENTANYL CITRATE (PF) 100 MCG/2ML IJ SOLN
INTRAMUSCULAR | Status: DC | PRN
  Administered 2018-12-23 (×3): 25 ug via INTRAVENOUS

## 2018-12-23 MED ORDER — MIDAZOLAM HCL (PF) 10 MG/2ML IJ SOLN
INTRAMUSCULAR | Status: DC | PRN
  Administered 2018-12-23 (×4): 2 mg via INTRAVENOUS

## 2018-12-23 MED ORDER — FENTANYL CITRATE (PF) 100 MCG/2ML IJ SOLN
INTRAMUSCULAR | Status: AC
  Filled 2018-12-23: qty 4

## 2018-12-23 MED ORDER — GABAPENTIN 300 MG PO CAPS
300.0000 mg | ORAL_CAPSULE | Freq: Two times a day (BID) | ORAL | Status: DC
  Administered 2018-12-23 – 2018-12-25 (×5): 300 mg via ORAL
  Filled 2018-12-23 (×5): qty 1

## 2018-12-23 MED ORDER — SODIUM CHLORIDE 0.9 % IV BOLUS
500.0000 mL | Freq: Once | INTRAVENOUS | Status: AC
  Administered 2018-12-23: 500 mL via INTRAVENOUS

## 2018-12-23 MED ORDER — LIDOCAINE-EPINEPHRINE 1 %-1:100000 IJ SOLN
INTRAMUSCULAR | Status: AC
  Filled 2018-12-23: qty 1

## 2018-12-23 MED ORDER — BUTAMBEN-TETRACAINE-BENZOCAINE 2-2-14 % EX AERO
INHALATION_SPRAY | CUTANEOUS | Status: DC | PRN
  Administered 2018-12-23: 1 via TOPICAL

## 2018-12-23 MED ORDER — ASPIRIN EC 81 MG PO TBEC
81.0000 mg | DELAYED_RELEASE_TABLET | Freq: Every day | ORAL | Status: DC
  Administered 2018-12-23 – 2018-12-25 (×3): 81 mg via ORAL
  Filled 2018-12-23 (×3): qty 1

## 2018-12-23 MED ORDER — LIDOCAINE-EPINEPHRINE 1 %-1:100000 IJ SOLN
INTRAMUSCULAR | Status: DC | PRN
  Administered 2018-12-23: 30 mL

## 2018-12-23 MED ORDER — MIDAZOLAM HCL (PF) 5 MG/ML IJ SOLN
INTRAMUSCULAR | Status: AC
  Filled 2018-12-23: qty 2

## 2018-12-23 SURGICAL SUPPLY — 2 items
MONITOR REVEAL LINQ II (Prosthesis & Implant Heart) ×2 IMPLANT
PACK LOOP INSERTION (CUSTOM PROCEDURE TRAY) ×3 IMPLANT

## 2018-12-23 NOTE — Progress Notes (Signed)
Pt's BP is hypotensive. Pt denies chest pain, denies nausea and vomiting and not in respiratory distress. NP Blount was notified and ordered to give bolus 500 ml NSS once. Will continue to monitor pt.   12/23/18 2019  Vitals  Temp 97.6 F (36.4 C)  Temp Source Oral  BP (!) 83/59  MAP (mmHg) 68  BP Location Right Arm  BP Method Automatic  Patient Position (if appropriate) Lying  Pulse Rate Source Dinamap  ECG Heart Rate 71  Resp 18  Oxygen Therapy  SpO2 98 %  O2 Device Room Air  MEWS Score  MEWS RR 0  MEWS Pulse 0  MEWS Systolic 1  MEWS LOC 0  MEWS Temp 0  MEWS Score 1  MEWS Score Color Green

## 2018-12-23 NOTE — Progress Notes (Signed)
STROKE TEAM PROGRESS NOTE   INTERVAL HISTORY Patient continues to do well and has no complaints today.  Pamela Carney continues to have persistent right-sided peripheral visual field loss.  TEE done today was negative for PFO or cardiac source of embolism.  Pamela Carney will be getting loop recorder insertion later today.  Pamela Carney did participate in the sleep SMART study and tested positive and will have the CPAP titration tonight  Vitals:   12/23/18 1411 12/23/18 1419 12/23/18 1429 12/23/18 1506  BP: 94/74 (!) 89/72 97/75 97/65   Pulse: 91 92 83 (!) 54  Resp: 20 13 18    Temp:  97.8 F (36.6 C)  97.6 F (36.4 C)  TempSrc:  Axillary  Oral  SpO2: 97% 96% 95% 97%  Weight:      Height:        CBC:  Recent Labs  Lab 12/22/18 0823  WBC 11.6*  HGB 12.1  HCT 39.3  MCV 102.6*  PLT 420*    Basic Metabolic Panel:  Recent Labs  Lab 12/20/18 0402 12/22/18 0823  NA 138 138  K 4.1 4.3  CL 102 102  CO2 26 23  GLUCOSE 126* 128*  BUN 20 23*  CREATININE 1.27* 1.34*  CALCIUM 9.0 9.2   Lipid Panel:     Component Value Date/Time   CHOL 183 12/15/2018 0353   TRIG 288 (H) 12/15/2018 0353   HDL 26 (L) 12/15/2018 0353   CHOLHDL 7.0 12/15/2018 0353   VLDL 58 (H) 12/15/2018 0353   LDLCALC 99 12/15/2018 0353   HgbA1c:  Lab Results  Component Value Date   HGBA1C 8.6 (H) 12/15/2018   Urine Drug Screen: No results found for: LABOPIA, COCAINSCRNUR, LABBENZ, AMPHETMU, THCU, LABBARB  Alcohol Level No results found for: ETH  IMAGING Ct Angio Head W Or Wo Contrast  Result Date: 12/21/2018 CLINICAL DATA:  53 year old female with vertigo, right side hemianopia and headache. Medial left occipital lobe infarct on brain MRI earlier today. EXAM: CT ANGIOGRAPHY HEAD AND NECK TECHNIQUE: Multidetector CT imaging of the head and neck was performed using the standard protocol during bolus administration of intravenous contrast. Multiplanar CT image reconstructions and MIPs were obtained to evaluate the vascular anatomy.  Carotid stenosis measurements (when applicable) are obtained utilizing NASCET criteria, using the distal internal carotid diameter as the denominator. CONTRAST:  66mL OMNIPAQUE IOHEXOL 350 MG/ML SOLN COMPARISON:  Brain MRI earlier today. FINDINGS: CTA NECK Skeleton: Poor mandibular dentition. Absent maxillary dentition. No acute osseous abnormality identified. Upper chest: Mosaic attenuation in the upper lungs probably due to mild gas trapping. No superior mediastinal lymphadenopathy. Other neck: Small calcified left thyroid nodule, does not meet size criteria for follow-up. No acute finding in the neck. Aortic arch: Calcified aortic atherosclerosis. Three vessel arch configuration. Right carotid system: No brachiocephalic artery or right CCA origin stenosis despite plaque. There appears to be bulky soft plaque along the proximal right CCA at the level of the thyroid (series 7, image 131) without stenosis. Calcified plaque then in the more distal right CCA and at the right carotid bifurcation. No significant stenosis at the bifurcation, but soft and calcified plaque continues through the bulb, and there is high-grade stenosis numerically estimated at 70-80% just distal to the bulb on series 7, image 95 and series 11, image 68. The right ICA remains patent to the skull base. Left carotid system: No left CCA origin stenosis despite abundant plaque. Medial soft plaque in the vessel at the larynx without stenosis. Calcified plaque approaching the left carotid  bifurcation. No left ICA origin stenosis, but continued plaque in the vessel resulting in bulb stenosis estimated at 60 % with respect to the distal vessel (series 7, image 100). The vessel remains patent to the skull base. Vertebral arteries: Proximal right subclavian plaque without significant stenosis. Right vertebral artery origin plaque with mild stenosis. Patent right vertebral artery to the skull base without additional stenosis. Bulky calcified plaque at  the left subclavian artery origin with high-grade stenosis on series 9, image 304. The left subclavian remains patent. Calcified plaque at the left vertebral artery origin resulting in moderate stenosis on series 9, image 275. The left vertebral artery is diminutive and irregular with intermittent V2 calcified plaque but remains patent to the skull base. CTA HEAD Posterior circulation: Diminutive and irregular bilateral distal vertebral arteries. Severe stenosis of the left V4 segment. The left vertebral is occluded beyond the left PICA origin which remains patent. The right V4 segment is diminutive but patent without stenosis and supplies the basilar. The basilar artery is diminutive with mild stenosis proximal to the tip. The bilateral AICA and SCA origins are patent, but there are fetal type bilateral PCA origins. The right PCA is patent but diminutive and irregular. See series 14, image 19. The left PCA is occluded at the bifurcation (P3 segment series 14, image 25). Some of the inferior left PCA division remains patent. Anterior circulation: Both ICA siphons are patent but heavily calcified. On the left there is up to moderate cavernous and supraclinoid segment stenosis. Normal left posterior communicating artery origin. On the right there is mild siphon stenosis. Patent carotid termini. Patent MCA and ACA origins. The left A1 is dominant. The left ACA A1 segment is dominant but the right A2 segment is dominant. Anterior communicating artery is within normal limits. No definite ACA branch stenosis. Left MCA M1 and bifurcation are patent without stenosis. Left MCA branches are within normal limits. Right MCA M1 and bifurcation are patent without stenosis. Right MCA branches are within normal limits. Venous sinuses: Early contrast timing, not evaluated. Anatomic variants: Fetal type bilateral PCA origins. Dominant left A1 and right A2. Review of the MIP images confirms the above findings IMPRESSION: 1. Positive  for occlusion of the distal Left PCA (P3), corresponding to the acute ischemia seen by MRI today. 2. Positive also for widespread atherosclerosis and stenosis in the head and neck: - Severe Left Subclavian Artery origin stenosis. - Severe Right ICA bulb stenosis (estimated at 70-80%). - diminutive Left Vertebral Artery which occludes beyond the left PICA origin, and is moderately stenotic at its origin. - Left ICA bulb 60% stenosis. - heavily calcified ICA siphons with moderate stenosis on the Left. - diminutive Basilar Artery with mild stenosis. 3.  Aortic Atherosclerosis (ICD10-I70.0). 4. Carious dentition. Electronically Signed   By: Genevie Ann M.D.   On: 12/21/2018 22:55   Ct Angio Neck W Or Wo Contrast  Result Date: 12/21/2018 CLINICAL DATA:  54 year old female with vertigo, right side hemianopia and headache. Medial left occipital lobe infarct on brain MRI earlier today. EXAM: CT ANGIOGRAPHY HEAD AND NECK TECHNIQUE: Multidetector CT imaging of the head and neck was performed using the standard protocol during bolus administration of intravenous contrast. Multiplanar CT image reconstructions and MIPs were obtained to evaluate the vascular anatomy. Carotid stenosis measurements (when applicable) are obtained utilizing NASCET criteria, using the distal internal carotid diameter as the denominator. CONTRAST:  46mL OMNIPAQUE IOHEXOL 350 MG/ML SOLN COMPARISON:  Brain MRI earlier today. FINDINGS: CTA NECK Skeleton:  Poor mandibular dentition. Absent maxillary dentition. No acute osseous abnormality identified. Upper chest: Mosaic attenuation in the upper lungs probably due to mild gas trapping. No superior mediastinal lymphadenopathy. Other neck: Small calcified left thyroid nodule, does not meet size criteria for follow-up. No acute finding in the neck. Aortic arch: Calcified aortic atherosclerosis. Three vessel arch configuration. Right carotid system: No brachiocephalic artery or right CCA origin stenosis despite  plaque. There appears to be bulky soft plaque along the proximal right CCA at the level of the thyroid (series 7, image 131) without stenosis. Calcified plaque then in the more distal right CCA and at the right carotid bifurcation. No significant stenosis at the bifurcation, but soft and calcified plaque continues through the bulb, and there is high-grade stenosis numerically estimated at 70-80% just distal to the bulb on series 7, image 95 and series 11, image 68. The right ICA remains patent to the skull base. Left carotid system: No left CCA origin stenosis despite abundant plaque. Medial soft plaque in the vessel at the larynx without stenosis. Calcified plaque approaching the left carotid bifurcation. No left ICA origin stenosis, but continued plaque in the vessel resulting in bulb stenosis estimated at 60 % with respect to the distal vessel (series 7, image 100). The vessel remains patent to the skull base. Vertebral arteries: Proximal right subclavian plaque without significant stenosis. Right vertebral artery origin plaque with mild stenosis. Patent right vertebral artery to the skull base without additional stenosis. Bulky calcified plaque at the left subclavian artery origin with high-grade stenosis on series 9, image 304. The left subclavian remains patent. Calcified plaque at the left vertebral artery origin resulting in moderate stenosis on series 9, image 275. The left vertebral artery is diminutive and irregular with intermittent V2 calcified plaque but remains patent to the skull base. CTA HEAD Posterior circulation: Diminutive and irregular bilateral distal vertebral arteries. Severe stenosis of the left V4 segment. The left vertebral is occluded beyond the left PICA origin which remains patent. The right V4 segment is diminutive but patent without stenosis and supplies the basilar. The basilar artery is diminutive with mild stenosis proximal to the tip. The bilateral AICA and SCA origins are  patent, but there are fetal type bilateral PCA origins. The right PCA is patent but diminutive and irregular. See series 14, image 19. The left PCA is occluded at the bifurcation (P3 segment series 14, image 25). Some of the inferior left PCA division remains patent. Anterior circulation: Both ICA siphons are patent but heavily calcified. On the left there is up to moderate cavernous and supraclinoid segment stenosis. Normal left posterior communicating artery origin. On the right there is mild siphon stenosis. Patent carotid termini. Patent MCA and ACA origins. The left A1 is dominant. The left ACA A1 segment is dominant but the right A2 segment is dominant. Anterior communicating artery is within normal limits. No definite ACA branch stenosis. Left MCA M1 and bifurcation are patent without stenosis. Left MCA branches are within normal limits. Right MCA M1 and bifurcation are patent without stenosis. Right MCA branches are within normal limits. Venous sinuses: Early contrast timing, not evaluated. Anatomic variants: Fetal type bilateral PCA origins. Dominant left A1 and right A2. Review of the MIP images confirms the above findings IMPRESSION: 1. 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 2. Positive also for widespread atherosclerosis and stenosis in the head and neck: - Severe Left Subclavian Artery origin stenosis. -  Severe Right ICA bulb stenosis (estimated at 70-80%). - diminutive Left Vertebral Artery which occludes beyond the left PICA origin, and is moderately stenotic at its origin. - Left ICA bulb 60% stenosis. - heavily calcified ICA siphons with moderate stenosis on the Left. - diminutive Basilar Artery with mild stenosis. 3.  Aortic Atherosclerosis (ICD10-I70.0). 4. Carious dentition. Electronically Signed   By: Genevie Ann M.D.   On: 12/21/2018 22:55   Vas US Carotid  Result Date: 12/22/2018 Carotid Arterial Duplex Study Indications: CVA.  Limitations  Today's exam was limited due to respiratory interference - snoring. Performing Technologist: Toma Copier RVS  Examination Guidelines: A complete evaluation includes B-mode imaging, spectral Doppler, color Doppler, and power Doppler as needed of all accessible portions of each vessel. Bilateral testing is considered an integral part of a complete examination. Limited examinations for reoccurring indications may be performed as noted.  Right Carotid Findings: +----------+--------+--------+--------+------------------+---------------------+           PSV cm/sEDV cm/sStenosisPlaque DescriptionComments              +----------+--------+--------+--------+------------------+---------------------+ CCA Prox  106     14              heterogenous      intiomal thickening                                                       with mild plaque      +----------+--------+--------+--------+------------------+---------------------+ CCA Distal81      19              heterogenous      intimal thickening                                                        with mild plaque      +----------+--------+--------+--------+------------------+---------------------+ ICA Prox  112     40      1-39%   heterogenous      mild to moderate                                                          plaque                +----------+--------+--------+--------+------------------+---------------------+ ICA Mid   224     29      60-79%  heterogenous      There is a narrowing                                                      at the proximal to  mid region velocities                                                     are psv 250, edv 76   +----------+--------+--------+--------+------------------+---------------------+ ICA Distal207     43      40-59%  heterogenous      mild plaque            +----------+--------+--------+--------+------------------+---------------------+ ECA       88      8               heterogenous      mild plaque at the                                                        origin                +----------+--------+--------+--------+------------------+---------------------+ +----------+--------+-------+--------+-------------------+           PSV cm/sEDV cmsDescribeArm Pressure (mmHG) +----------+--------+-------+--------+-------------------+ Subclavian27      1                                  +----------+--------+-------+--------+-------------------+ +---------+--------+--+--------+--+ VertebralPSV cm/s36EDV cm/s11 +---------+--------+--+--------+--+ There is a 60% to 79% ICA stenosis at the proximal to mid egion Left Carotid Findings: +----------+--------+--------+--------+--------------------+-------------------+           PSV cm/sEDV cm/sStenosisPlaque Description  Comments            +----------+--------+--------+--------+--------------------+-------------------+ CCA Prox  125     17              heterogenous        intimal wall                                                              changes with mild                                                         plaque              +----------+--------+--------+--------+--------------------+-------------------+ CCA Distal97      22              heterogenous        intimal wall                                                              changes with mild  plaque              +----------+--------+--------+--------+--------------------+-------------------+ ICA Prox  138     35      1-39%   heterogenous and    mild to moderate                                      irregular           plaque              +----------+--------+--------+--------+--------------------+-------------------+  ICA Mid   174     30      1-39%   heterogenous        mild plaque         +----------+--------+--------+--------+--------------------+-------------------+ ICA Distal189     53      40-59%  heterogenous        tortuous with mild                                                        plaque              +----------+--------+--------+--------+--------------------+-------------------+ ECA       117     7                                   intimal thickening  +----------+--------+--------+--------+--------------------+-------------------+ +----------+--------+--------+--------+-------------------+           PSV cm/sEDV cm/sDescribeArm Pressure (mmHG) +----------+--------+--------+--------+-------------------+ [id number]                                          +----------+--------+--------+--------+-------------------+ +---------+--------+--+--------+-+ VertebralPSV cm/s37EDV cm/s4 +---------+--------+--+--------+-+  Summary: Right Carotid: Velocities in the right ICA are consistent with a 60-79%                stenosis. See technical comments listed above. Left Carotid: Velocities in the left ICA are consistent with a 40-59% stenosis.               See technical comments listed above. Vertebrals:  Bilateral vertebral arteries demonstrate antegrade flow. Subclavians: Normal flow hemodynamics were seen in bilateral subclavian              arteries. *See table(s) above for measurements and observations.  Electronically signed by Antony Contras MD on 12/22/2018 at 1:07:42 PM.    Final     PHYSICAL EXAM Obese middle-aged Caucasian lady not in distress. . Afebrile. Head is nontraumatic. Neck is supple without bruit.    Cardiac exam no murmur or gallop. Lungs are clear to auscultation. Distal pulses are well felt. Neurological Exam ;  Awake  Alert oriented x 3. Normal speech and language diminished attention, registration and recall..eye movements full without nystagmus.fundi  were not visualized. Vision acuity   appears normal.  Dense right homonymous hemianopsia.  Hearing is normal. Palatal movements are normal. Face symmetric. Tongue midline. Normal strength, tone, reflexes and coordination. Normal sensation. Gait deferred.    ASSESSMENT/PLAN [name] is a 53 y.o. female with history of multiple medical problems.  For stroke risk factors patient  does have history of hypertension and diabetes who was admitted secondary to nephrolithiasis including staghorn calculi that required a nephrostomy tubes to be placed. Pamela Carney had urospesis w/ positive blood cultures. Pamela Carney has been complaining the right eye vision defect x 1 week. ophthalmologist consulted and found a filed cut. MRI confirmed an acute/subacute infarct in the right medial occipital lobe.    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 If negative, need to look for AF.  MRI  L medial occipital infarct. Moderate small vessel disease.   CTA head & neck L P3 occlusion. Widespread atherosclerosis head and neck (severe L SCA, severe R ICA bulb 70-80%, L VA occlusion, L ICA bulb 60%, moderate L ICA siphon stenosis, diminutive BA stenosis). Aortic atherosclerosis. Carious dentition.   Carotid Doppler  R 50-79%, L 40-59% stenosis   2D Echo EF 55%. No source of embolus   TEE to look for endocarditis. Arranged with [place] for tomorrow.  If positive for PFO (patent foramen ovale), check bilateral lower extremity venous dopplers to rule out DVT as possible source of stroke.   If TEE negative for endocarditis, a [place] electrophysiologist will consult and consider placement of an implantable loop recorder to evaluate for atrial fibrillation as etiology of stroke. This has been explained to patient/family by Dr. Leonie Man and they are agreeable.   LDL 99  HgbA1c 8.6  Heparin  5000 units sq tid for VTE prophylaxis  No antithrombotic prior to admission, now on aspirin 325 mg daily. Continue aspirin 81 mg and plavix 75 mg x 3 weeks and then aspirin alone Therapy recommendations:  OP OT  Disposition:  Return home, likely tomorrow  Bilateral Carotid stenosis  CTA head & neck L P3 occlusion. Widespread atherosclerosis head and neck (severe L SCA, severe R ICA bulb 70-80%, L VA occlusion, L ICA bulb 60%, moderate L ICA siphon stenosis, diminutive BA stenosis).   Carotid Doppler  R 50-79%, L 40-59% stenosis   R ICA stenosis not associated with current stroke  VVS consulted - Dr. Leonie Man discussed with Dr. Donzetta Matters - will follow as an OP  Hypertension  Hx HTN but not on meds at home PTA or in hospital  SBP 80-90s . Permissive hypertension (OK if < 220/120) but gradually normalize in 5-7 days . Long-term BP goal normotensive given ICA stenosis   Hyperlipidemia  Home meds:  No statin  Now on lipitor 80  LDL 99, goal < 70  Continue statin at discharge  Diabetes type II Uncontrolled  HgbA1c 8.6, goal < 7.0  Other Stroke Risk Factors  Cigarette smoker, advised to stop smoking  Morbid Obesity, Body mass index is 39.25 kg/m., recommend weight loss, diet and exercise as appropriate   Family hx stroke (maternal grandmother)  Migraines  Other Active Problems  Septic shock ([place]) - Resolving sepsis with recent septic shock secondary to complicated Enterococcus faecalis/E. coli UTI in the setting of right distal ureteral calculus with pyelonephrosis and E. coli bacteremia  E. coli bacteremia, poa  E. coli bacteremia, poa  Ureteral/bladder spasm  Vaginal candidiasis  Resolving prerenal AKI with IV fluid hydration  E. coli bacteremia likely from urinary source  Right distal ureteral calculus with pyonephrosis status post right cystoscopy and stent with urology on 12/15/2018  History of type I bipolar disorder  Physical debility: Improving  Moderate  aorto bi-iliac atherosclerotic disease  Hospital day #  8   .  Pamela Carney has moderate right greater than left carotid stenosis but this is asymptomatic and can be followed by vascular surgery as an outpatient with aggressive medical therapy should be tried first.  Pamela Carney will have CPAP mask titration trial tonight as part of the sleep SMART study.   Aspirin and Plavix for now.  Greater than 50% time during the 25-minute visit was spent on counseling and coordination of care about her embolic stroke and discussion about treatment options as well as sleep apnea and  e participation in the sleep smart study if interested.  Discussed with Dr. Nevada Crane. Antony Contras, MD  Antony Contras, MD Medical Director [place] Pager: [phone] 12/23/2018 3:52 PM   To contact Stroke Continuity provider, please refer to [email or web address] After hours, contact General Neurology

## 2018-12-23 NOTE — Consult Note (Addendum)
ELECTROPHYSIOLOGY CONSULT NOTE  Patient ID: BRANDILYN NANNINGA MRN: [id number], DOB/AGE: [birth year]   Admit date: 12/14/2018 Date of Consult: 12/23/2018  Primary Physician: Patient, No Pcp Per Primary Cardiologist: none Reason for Consultation: Cryptogenic stroke ; recommendations regarding Implantable Loop Recorder, requested by Dr. Leonie Man  History of Present Illness Rosemary Holms w/PMHx of Biopolar I, DM, CKD (III), HTN, smoker, was admitted on 12/14/2018 with abdominal pain, found septic with with obstructing calculus in ureter requiring nephrostomy tubes.  The patient reported c/o L visual changes,  kaleidoscope-like colors in her left visual field, (pre-dating her hospitalization) ophthalmologist was called and found that the findings are consistent with a right homonymous hemianopsia recommended MRI that found acue/subacute stroke.  Neurology was brought to the case, notes report L occipital infarct in setting of urosepsis - infarct embolic secondary to unknown source, suspicious for endocarditis. If negative, need to look for AF   Also found with significant carotid disease that will likely require intervention though in d/w neurology APP, this not felt to be source of her stroke and would like evaluation for loop.   The patient has been monitored on telemetry which has demonstrated sinus rhythm with no arrhythmias.  Inpatient stroke work-up is to be completed with a TEE.   Echocardiogram this admission demonstrated  IMPRESSIONS  1. Left ventricular ejection fraction, by visual estimation, is 55%. The left ventricle has normal function. Normal left ventricular size. There is mildly increased left ventricular hypertrophy. Hypokinesis of the apical septal wall and the true apex.  2. Left ventricular diastolic Doppler parameters are consistent with pseudonormalization pattern of LV diastolic filling.  3. The aortic valve is tricuspid Aortic valve regurgitation was not visualized by color flow  Doppler. Mild aortic valve sclerosis without stenosis.  4. Trivial pericardial effusion is present.  5. Global right ventricle has normal systolic function.The right ventricular size is normal. No increase in right ventricular wall thickness.  6. Right atrial size was normal.  7. Left atrial size was normal.  8. The mitral valve is normal in structure. No evidence of mitral valve regurgitation. No evidence of mitral stenosis.  9. The tricuspid valve is normal in structure. Tricuspid valve regurgitation was not visualized by color flow Doppler. 10. The inferior vena cava is normal in size with greater than 50% respiratory variability, suggesting right atrial pressure of 3 mmHg. 11. TR signal is inadequate for assessing pulmonary artery systolic pressure.     Lab work is reviewed. WBC today is 11 She is afebrile Sepsis with recent septic shock secondary to complicated Enterococcus faecalis/E. coli UTI in the setting of right distal ureteral calculus with pyelonephrosis and E. coli bacteremia Repeat BC 12/17/2018 (x2) are negative x5 days Has been transitioned to PO antibiotics and reportedly ready for discharge    Prior to admission, the patient denies chest pain, shortness of breath,.  She has remote reports of syncope associated with palpitations that occurred years ago.  She is recovering with plans to home at discharge.    Past Medical History:  Diagnosis Date   Abnormal Pap smear    AKI (acute kidney injury) ([place]) 12/28/2017   Anxiety    Arthritis    bil knees, neck   Bipolar 1 disorder (HCC)    Cataract    Mild   Cholelithiasis 12/23/2017   noted on CT renal, pt unaware   Chronic kidney disease (CKD), stage III (moderate) (HCC)    Cystitis    Diabetes mellitus without complication (  [place])    Genital warts    Hx of genital   GERD (gastroesophageal reflux disease)    Heart murmur    childhood   Hematuria    History of blood transfusion    History of  ectopic pregnancy    History of gestational diabetes    History of kidney stones    History of sepsis    after ectopic pregnancy   Hypertension    Idiopathic peripheral neuropathy    both feet   Leg ulcer, left (HCC)    Low back pain    Migraines    Obesity    Oral candidiasis    Pneumonia    PONV (postoperative nausea and vomiting)    prolonged sedation   Recurrent UTI    Renal calculi 12/23/2017   Multiple bilateral nonobstructing, 2.2 cm lower pole partial staghorn on left, noted on CT renal   Sigmoid diverticulosis 12/23/2017   noted on CT renal, pt unaware   Ulcer of foot ([place])    Left   Weakness      Surgical History:  Past Surgical History:  Procedure Laterality Date   CERVICAL CONE BIOPSY     CESAREAN SECTION     x3   CYSTOSCOPY W/ URETERAL STENT PLACEMENT Right 12/15/2018   Procedure: Cystoscopy, right retrograde ureteropyelogram, fluoroscopic interpretation, right double-J stent placement (24 cm x 6 [place]);  Surgeon: Franchot Gallo, MD;  Location: [facility];  Service: Urology;  Laterality: Right;   CYSTOSCOPY/URETEROSCOPY/HOLMIUM LASER/STENT PLACEMENT Right 02/04/2018   Procedure: CYSTOSCOPY/URETEROSCOPY/RETROGRADE PYELOGRAM/HOLMIUM LASER/STENT PLACEMENT;  Surgeon: Alexis Frock, MD;  Location: [facility];  Service: Urology;  Laterality: Right;   CYSTOSCOPY/URETEROSCOPY/HOLMIUM LASER/STENT PLACEMENT Right 02/07/2018   Procedure: CYSTOSCOPY LEFT STENT EXCHANGE;  Surgeon: Alexis Frock, MD;  Location: [facility];  Service: Urology;  Laterality: Right;   DILATION AND CURETTAGE OF UTERUS     ECTOPIC PREGNANCY SURGERY     IR NEPHROSTOMY PLACEMENT LEFT  12/25/2017   NEPHROLITHOTOMY Left 02/04/2018   Procedure: NEPHROLITHOTOMY PERCUTANEOUS;  Surgeon: Alexis Frock, MD;  Location: [facility];  Service: Urology;  Laterality: Left;  3 HRS   NEPHROLITHOTOMY Left 02/07/2018   Procedure: SECOND LOOK NEPHROLITHOTOMY PERCUTANEOUS;  Surgeon: Alexis Frock,  MD;  Location: [facility];  Service: Urology;  Laterality: Left;  2 HRS   OVARIAN CYST REMOVAL     UNILATERAL SALPINGECTOMY     Pt unsure of which fallopian tube was removed   WISDOM TOOTH EXTRACTION       Medications Prior to Admission  Medication Sig Dispense Refill Last Dose   insulin degludec (TRESIBA) 100 UNIT/ML SOPN FlexTouch Pen Inject 0.2 mLs (20 Units total) into the skin daily. 1 pen 3 Past Week at Unknown time   levonorgestrel (MIRENA) 20 MCG/24HR IUD 1 each by Intrauterine route once.   unk   metFORMIN (GLUCOPHAGE) 500 MG tablet Take 1 tablet (500 mg total) by mouth 2 (two) times daily with a meal. PATIENT MUST HAVE OFFICE VISIT PRIOR TO ANY FURTHER REFILLS! 60 tablet 0 Past Month at Unknown time   Multiple Vitamins-Minerals (ONE-A-DAY WOMENS 50+ ADVANTAGE) TABS Take 1 tablet by mouth daily.   Past Week at Unknown time   Blood Glucose Monitoring Suppl (ONETOUCH VERIO) w/Device KIT Use to check blood sugars every morning fasting and 2 hours after largest meal 1 kit 0    glucose blood (ONETOUCH VERIO) test strip Use to check blood sugars every morning fasting and 2 hours after largest meal 200 each 3  Insulin Pen Needle (PEN NEEDLES) 31G X 8 MM MISC 1 each by Does not apply route daily. 100 each 3    ONE TOUCH LANCETS MISC Use to check blood sugars every morning fasting and 2 hours after largest meal 200 each 3     Inpatient Medications:   aspirin EC  81 mg Oral Daily   atorvastatin  80 mg Oral q1800   cefdinir  300 mg Oral Q12H   Chlorhexidine Gluconate Cloth  6 each Topical Daily   clopidogrel  75 mg Oral Daily   heparin  5,000 Units Subcutaneous Q8H   insulin aspart  0-5 Units Subcutaneous QHS   insulin aspart  0-9 Units Subcutaneous TID WC   insulin glargine  15 Units Subcutaneous QHS   mouth rinse  15 mL Mouth Rinse BID   opium-belladonna  1 suppository Rectal Once   oxybutynin  2.5 mg Oral TID   polyethylene glycol  17 g Oral Daily    senna-docusate  2 tablet Oral BID   sodium chloride flush  3 mL Intravenous Once    Allergies:  Allergies  Allergen Reactions   Sulfa Antibiotics Hives and Swelling    Swelling site not recalled   Latex Rash   Tape Rash    Not tolerated well    Social History   Socioeconomic History   Marital status: Married    Spouse name: Not on file   Number of children: Not on file   Years of education: Not on file   Highest education level: Not on file  Occupational History   Not on file  Social Needs   Financial resource strain: Not on file   Food insecurity    Worry: Not on file    Inability: Not on file   Transportation needs    Medical: Not on file    Non-medical: Not on file  Tobacco Use   Smoking status: Current Every Day Smoker    Packs/day: 1.00    Years: 37.00    Pack years: 37.00    Types: Cigarettes   Smokeless tobacco: Never Used  Substance and Sexual Activity   Alcohol use: No    Alcohol/week: 0.0 standard drinks   Drug use: No   Sexual activity: Not Currently    Birth control/protection: I.U.D.  Lifestyle   Physical activity    Days per week: Not on file    Minutes per session: Not on file   Stress: Not on file  Relationships   Social connections    Talks on phone: Not on file    Gets together: Not on file    Attends religious service: Not on file    Active member of club or organization: Not on file    Attends meetings of clubs or organizations: Not on file    Relationship status: Not on file   Intimate partner violence    Fear of current or ex partner: Not on file    Emotionally abused: Not on file    Physically abused: Not on file    Forced sexual activity: Not on file  Other Topics Concern   Not on file  Social History Narrative   Not on file     Family History  Problem Relation Age of Onset   Diabetes Paternal Grandfather    COPD Paternal Grandfather    Heart disease Paternal Grandfather    Cancer Paternal  Grandfather        bone   Cancer Paternal Grandmother  Heart disease Paternal Grandmother    Cancer Father    Hypertension Father    Heart attack Father    Alcohol abuse Father    Depression Father    Heart failure Mother    Diabetes Mother    Heart disease Mother    Hyperlipidemia Mother    Hypertension Mother    Heart attack Mother    Peripheral vascular disease Mother    Depression Mother    COPD Mother    Hypertension Sister    Heart attack Sister    Stroke Maternal Grandmother       Review of Systems: All other systems reviewed and are otherwise negative except as noted above.  Physical Exam: Vitals:   12/22/18 0800 12/22/18 1400 12/22/18 2111 12/23/18 0425  BP: (!) 87/59 (!) 97/54 (!) 85/62 93/61  Pulse: 69 63 (!) 56 (!) 53  Resp:   18 19  Temp: 97.6 F (36.4 C) 97.8 F (36.6 C) 97.6 F (36.4 C) 98.2 F (36.8 C)  TempSrc: Oral Oral Oral Oral  SpO2: 93% 96% 93% 93%  Weight:    97.3 kg  Height:        GEN- The patient is well appearing, alert and oriented x 3 today.   Head- normocephalic, atraumatic Eyes-  Sclera clear, conjunctiva pink Ears- hearing intact Oropharynx- clear Neck- supple Lungs- CTA b/l, normal work of breathing Heart- RRR, no murmurs, rubs or gallops  GI- soft, NT, ND Extremities- no clubbing, cyanosis, or edema MS- no significant deformity or atrophy Skin- no rash or lesion Psych- euthymic mood, full affect   Labs:   Lab Results  Component Value Date   WBC 11.6 (H) 12/22/2018   HGB 12.1 12/22/2018   HCT 39.3 12/22/2018   MCV 102.6 (H) 12/22/2018   PLT 420 (H) 12/22/2018    Recent Labs  Lab 12/22/18 0823  NA 138  K 4.3  CL 102  CO2 23  BUN 23*  CREATININE 1.34*  CALCIUM 9.2  GLUCOSE 128*   No results found for: CKTOTAL, CKMB, CKMBINDEX, TROPONINI Lab Results  Component Value Date   CHOL 183 12/15/2018   Lab Results  Component Value Date   HDL 26 (L) 12/15/2018   Lab Results  Component  Value Date   LDLCALC 99 12/15/2018   Lab Results  Component Value Date   TRIG 288 (H) 12/15/2018   Lab Results  Component Value Date   CHOLHDL 7.0 12/15/2018   No results found for: LDLDIRECT  No results found for: DDIMER   Radiology/Studies:    Ct Angio Head W Or Wo Contrast Result Date: 12/21/2018 CLINICAL DATA:  53 year old female with vertigo, right side hemianopia and headache. Medial left occipital lobe infarct on brain MRI earlier today. EXAM: CT ANGIOGRAPHY HEAD AND NECK TECHNIQUE: Multidetector CT imaging of the head and neck was performed using the standard protocol during bolus administration of intravenous contrast. Multiplanar CT image reconstructions and MIPs were obtained to evaluate the vascular anatomy. Carotid stenosis measurements (when applicable) are obtained utilizing NASCET criteria, using the distal internal carotid diameter as the denominator. CONTRAST:  61m OMNIPAQUE IOHEXOL 350 MG/ML SOLN COMPARISON:  Brain MRI earlier today. FINDINGS: CTA NECK Skeleton: Poor mandibular dentition. Absent maxillary dentition. No acute osseous abnormality identified. Upper chest: Mosaic attenuation in the upper lungs probably due to mild gas trapping. No superior mediastinal lymphadenopathy. Other neck: Small calcified left thyroid nodule, does not meet size criteria for follow-up. No acute finding in the neck. Aortic arch:  Calcified aortic atherosclerosis. Three vessel arch configuration. Right carotid system: No brachiocephalic artery or right CCA origin stenosis despite plaque. There appears to be bulky soft plaque along the proximal right CCA at the level of the thyroid (series 7, image 131) without stenosis. Calcified plaque then in the more distal right CCA and at the right carotid bifurcation. No significant stenosis at the bifurcation, but soft and calcified plaque continues through the bulb, and there is high-grade stenosis numerically estimated at 70-80% just distal to the bulb  on series 7, image 95 and series 11, image 68. The right ICA remains patent to the skull base. Left carotid system: No left CCA origin stenosis despite abundant plaque. Medial soft plaque in the vessel at the larynx without stenosis. Calcified plaque approaching the left carotid bifurcation. No left ICA origin stenosis, but continued plaque in the vessel resulting in bulb stenosis estimated at 60 % with respect to the distal vessel (series 7, image 100). The vessel remains patent to the skull base. Vertebral arteries: Proximal right subclavian plaque without significant stenosis. Right vertebral artery origin plaque with mild stenosis. Patent right vertebral artery to the skull base without additional stenosis. Bulky calcified plaque at the left subclavian artery origin with high-grade stenosis on series 9, image 304. The left subclavian remains patent. Calcified plaque at the left vertebral artery origin resulting in moderate stenosis on series 9, image 275. The left vertebral artery is diminutive and irregular with intermittent V2 calcified plaque but remains patent to the skull base. CTA HEAD Posterior circulation: Diminutive and irregular bilateral distal vertebral arteries. Severe stenosis of the left V4 segment. The left vertebral is occluded beyond the left PICA origin which remains patent. The right V4 segment is diminutive but patent without stenosis and supplies the basilar. The basilar artery is diminutive with mild stenosis proximal to the tip. The bilateral AICA and SCA origins are patent, but there are fetal type bilateral PCA origins. The right PCA is patent but diminutive and irregular. See series 14, image 19. The left PCA is occluded at the bifurcation (P3 segment series 14, image 25). Some of the inferior left PCA division remains patent. Anterior circulation: Both ICA siphons are patent but heavily calcified. On the left there is up to moderate cavernous and supraclinoid segment stenosis. Normal  left posterior communicating artery origin. On the right there is mild siphon stenosis. Patent carotid termini. Patent MCA and ACA origins. The left A1 is dominant. The left ACA A1 segment is dominant but the right A2 segment is dominant. Anterior communicating artery is within normal limits. No definite ACA branch stenosis. Left MCA M1 and bifurcation are patent without stenosis. Left MCA branches are within normal limits. Right MCA M1 and bifurcation are patent without stenosis. Right MCA branches are within normal limits. Venous sinuses: Early contrast timing, not evaluated. Anatomic variants: Fetal type bilateral PCA origins. Dominant left A1 and right A2. Review of the MIP images confirms the above findings IMPRESSION: 1. 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 2. Positive also for widespread atherosclerosis and stenosis in the head and neck: - Severe Left Subclavian Artery origin stenosis. - Severe Right ICA bulb stenosis (estimated at 70-80%). - diminutive Left Vertebral Artery which occludes beyond the left PICA origin, and is moderately stenotic at its origin. - Left ICA bulb 60% stenosis. - heavily calcified ICA siphons with moderate stenosis on the Left. - diminutive Basilar Artery with mild  stenosis. 3.  Aortic Atherosclerosis (ICD10-I70.0). 4. Carious dentition. Electronically Signed   By: Genevie Ann M.D.   On: 12/21/2018 22:55     Mr Brain Wo Contrast Result Date: 12/21/2018 CLINICAL DATA:  Vertigo.  Right-sided hemianopia.  Headache. EXAM: MRI HEAD WITHOUT CONTRAST TECHNIQUE: Multiplanar, multiecho pulse sequences of the brain and surrounding structures were obtained without intravenous contrast. COMPARISON:  CT head 08/19/2007 FINDINGS: Brain: Acute/subacute infarct in the left medial occipital lobe. No associated hemorrhage. No other acute infarct. Scattered white matter hyperintensities bilaterally compatible chronic ischemia,  moderate in degree. Patchy hyperintensity in the pons bilaterally. Small chronic infarct head of caudate on the right. Chronic infarct left internal capsule and left external capsule. Ventricle size normal.  Negative for hemorrhage or mass. Vascular: Normal arterial flow voids. Skull and upper cervical spine: Negative Sinuses/Orbits: Negative Other: None IMPRESSION: Acute/subacute infarct left medial occipital lobe without hemorrhage Moderate chronic microvascular ischemic change. Electronically Signed   By: Franchot Gallo M.D.   On: 12/21/2018 12:25     Vas US Carotid Result Date: 12/22/2018 Carotid Arterial Duplex Study Indications: CVA. Limitations  Today's exam was limited due to respiratory interference - snoring. Performing Technologist: Toma Copier RVS  Examination Guidelines: A complete evaluation includes B-mode imaging, spectral Doppler, color Doppler, and power Doppler as needed of all accessible portions of each vessel. Bilateral testing is considered an integral part of a complete examination. Limited examinations for reoccurring indications may be performed as noted.  Right Carotid              Summary: Right Carotid: Velocities in the right ICA are consistent with a 60-79%                stenosis. See technical comments listed above. Left Carotid: Velocities in the left ICA are consistent with a 40-59% stenosis.               See technical comments listed above. Vertebrals:  Bilateral vertebral arteries demonstrate antegrade flow. Subclavians: Normal flow hemodynamics were seen in bilateral subclavian              arteries. *See table(s) above for measurements and observations.  Electronically signed by Antony Contras MD on 12/22/2018 at 1:07:42 PM.    Final     12-lead ECG SR only All prior EKG's in EPIC reviewed with no documented atrial fibrillation  Telemetry SR  Assessment and Plan:  1. Cryptogenic stroke The patient presents with cryptogenic stroke.  The patient has a TEE  planned for today.  Dr. Curt Bears spoke at length with the patient about monitoring for afib with either a 30 day event monitor or an implantable loop recorder.  Risks, benefits, and alteratives to implantable loop recorder were discussed with the patient today.   At this time, tshe is very clear in the decision to proceed with implantable loop recorder.   Wound care was reviewed with the patient (keep incision clean and dry for 3 days).  Wound check will scheduled for the patient  Please call with questions.   Baldwin Jamaica, PA-C 12/23/2018  I have seen and examined this patient with Tommye Standard.  Agree with above, note added to reflect my findings.  On exam, RRR, no murmurs, lungs clear.  Patient presented to the hospital with cryptogenic stroke. To date, no cause has been found. TEE planned for today. If unrevealing, will plan for LINQ monitor to look for atrial fibrillation. Risks and benefits discussed. Risks include but not limited  to bleeding and infection. The patient understands the risks and has agreed to the procedure.  Sydelle Sherfield M. Pebbles Zeiders MD 12/23/2018 1:59 PM

## 2018-12-23 NOTE — Progress Notes (Signed)
    Transesophageal Echocardiogram Note  Pamela Carney [id number] 1966/03/17  Procedure: Transesophageal Echocardiogram Indications: CVA  Procedure Details Consent: Obtained Time Out: Verified patient identification, verified procedure, site/side was marked, verified correct patient position, special equipment/implants available, Radiology Safety Procedures followed,  medications/allergies/relevent history reviewed, required imaging and test results available.  Performed  Medications:  During this procedure the patient is administered a total of Versed 8 mg and Fentanyl 75 mcg  to achieve and maintain moderate conscious sedation.  The patient's heart rate, blood pressure, and oxygen saturation are monitored continuously during the procedure. The period of conscious sedation is 30 minutes, of which I was present face-to-face 100% of this time.  Normal LV function; mild LVH; negative saline microcavitation study.  Complications: No apparent complications Patient did tolerate procedure well.  Kirk Ruths, MD

## 2018-12-23 NOTE — Progress Notes (Signed)
  Echocardiogram Echocardiogram Transesophageal has been performed.  Johny Chess 12/23/2018, 2:37 PM

## 2018-12-23 NOTE — Interval H&P Note (Signed)
History and Physical Interval Note:  12/23/2018 1:26 PM  Pamela Carney  has presented today for surgery, with the diagnosis of BACTEREMIA.  The various methods of treatment have been discussed with the patient and family. After consideration of risks, benefits and other options for treatment, the patient has consented to  Procedure(s): TRANSESOPHAGEAL ECHOCARDIOGRAM (TEE) (N/A) as a surgical intervention.  The patient's history has been reviewed, patient examined, no change in status, stable for surgery.  I have reviewed the patient's chart and labs.  Questions were answered to the patient's satisfaction.     Kirk Ruths

## 2018-12-23 NOTE — Progress Notes (Signed)
PT Cancellation Note  Patient Details Name: Pamela Carney MRN: [id number] DOB: [birth year]   Cancelled Treatment:    Reason Eval/Treat Not Completed: Patient at procedure or test/unavailable. Will continue to follow when available.    Albertina Leise 12/23/2018, 1:40 PM

## 2018-12-23 NOTE — Progress Notes (Signed)
PROGRESS NOTE  Pamela Carney [id number] DOB: 17-Mar-1966 DOA: 12/14/2018 PCP: Patient, No Pcp Per  HPI/Recap of past 24 hours: 53 yr old F w/ PMHx sig for nephrolithiasis and Bipolar disorder presented to [facility] w/ abdominal pain and emesis found to have a 11 mm obtsructng stone.Septic shock. POD 1 Cystoscopy, right retrograde ureteropyelogram, fluoroscopic interpretation, right double-J stent placement. 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 Per EDP documentation the patient stated that these symptoms began three days ago and have become progressively worse. She also reported subjective fevers, and a non productive cough.   POD 1 Cystoscopy, right retrograde ureteropyelogram, fluoroscopic interpretation, right double-J stent placement. Seen Overnight laying in bed. Ambulates to rest room. Only required severe pain meds once overnight post void. Tolerated diet. Off of Levophed gtt for several hours and remains hemodynamically stable.   Transferred to [facility] service on 12/17/2018.   Vaginal candidiasis treated on 12/18/18 with 1 dose p.o. fluconazole 200 mg once.   Reports ureteral spasm and discomfort of lower abdomen on 12/19/2018, started on Pyridium.   Hospital course complicated by persistent right eye discomfort which prompted ophthalmology consult.  Recommendation for MRI brain.  Completed on 12/21/2018, showed acute/subacute ischemic CVA involving left medial occipital lobe.  Seen by stroke team.  Discussed with Dr. Talbert Forest, will follow-up with ophthalmology outpatient.  12/23/18: Patient seen and examined at her bedside this morning.  No acute events overnight.  On overnight sleep study.  She states she had  burning needlelike sensation in both feet last night, persists this morning.  Plan for TEE today.   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 Stroke work-up: LDL 99 with goal less than 70 A1c 8.6 with goal less than 7.0 2D echo done on 12/15/2018 showed normal LVEF with no evidence of PFO Continue high-dose aspirin 325 mg daily and Lipitor 80 mg daily Twelve-lead EKG done on 12/21/2018 showed normal sinus rhythm Continue normal saline at 50 cc/h x 1 day Bilateral carotid Doppler ultrasound showed right ICA 60 to 79% stenosis, left ICA 40 to 59% stenosis Vascular surgery Dr. Donzetta Matters, seen with plan for aggressive medical management and follow-up outpatient in 6 months with carotid duplex in the office. Started on aspirin and Plavix as recommended by neurology TEE planned on 12/23/2018 to rule out endocarditis  Bilateral carotid artery stenosis right greater than left Doppler ultrasound revealed right ICA 60 to 79% stenosis, left ICA 40 to 59% stenosis Moderate/cerebral Continue aspirin 81 mg daily Plavix 75 mg daily and Lipitor 80 mg daily Will follow-up with vascular surgery outpatient in 6 months  Polyneuropathy likely secondary to uncontrolled diabetes with hyperglycemia Start gabapentin 300 mg twice daily Follow-up with PCP  CKD 3 Baseline creatinine appears to be 1.1 with GFR 52 Creatinine 1.34 on 12/22/2018 with GFR of 45 Continue to avoid nephrotoxins Continue to avoid hypotension Continue gentle IV fluid hydration normal saline at 75 cc/h Monitor urine output Repeat BMP in the morning  Resolving  sepsis with recent septic shock secondary to complicated Enterococcus  faecalis/E. coli UTI in the setting of right distal ureteral calculus with pyelonephrosis and E. coli bacteremia Off pressors Urine culture taken on 12/15/2018 grew 20,000 colonies of Enterococcus faecalis, 2000 colonies of E. coli Resistance to ampicillin and ciprofloxacin intermediate resistance to ampicillin/sulbactam Completed 5 days of Rocephin Switch to Keflex 500 mg 3 times daily on 12/20/18 x5 days  Right homonymous hemianopsia /right eye discomfort secondary to acute/subacute ischemic CVA involving left medial occipital lobe States she has been seeing rings with her right eye and is causing dizziness Seen by ophthalmology Dr. Talbert Forest who recommended brain MRI. Informed Dr. Talbert Forest of new findings; follow-up with ophthalmology Dr. Talbert Forest outpatient.  E. coli bacteremia, poa Management for stated above Repeated blood cultures x2 done on 12/17/2018- to date Currently on cefdinir We will complete total of 10 days of antibiotics TEE planned today to rule out endocarditis  UTI with LUTS, POA Management as stated above  Ureteral/bladder spasm Continue Pyridium 100 mg 3 times daily x3 days Start oxybutynin and continue until seen at the neurology clinic Obtain KUB to assure proper placement of stent  Vaginal candidiasis Ordered 1 dose of fluconazole 200 mg once  Resolving prerenal AKI with IV fluid hydration Appears to be at her baseline creatinine 1.1 with GFR of 52 Presented with creatinine of 2.45 and GFR 22 DC IV fluid Continue to avoid nephrotoxins such as NSAIDs Good urine output greater than 3.9 L recorded in the last 24 hours  E. coli bacteremia likely from urinary source Blood cultures drawn on 12/15/2018 1 out of 2 bottles grew E. coli Resistant to ampicillin, ampicillin/sulbactam, ciprofloxacin Completed 5 days of Rocephin Repeat blood culture x2 peripherally on 12/17/18 no growth to date Remove central line right IJ on 12/19/2018   Right distal ureteral calculus with  pyonephrosis status post right cystoscopy and stent with urology on 12/15/2018 Continue to optimize pain control Reports persistent spasm; will contact urology.  Type 2 diabetes with hyperglycemia Blood sugar is better controlled Hemoglobin A1c 8.6 on 12/15/2018 Continue insulin coverage Continue to hold off oral anti-glycemic's  Physical debility: Improving PT OT had no further recommendations Continue to mobilize Fall precautions  History of type I bipolar disorder Currently not on any medications.  Pharmacy review  Moderate aorto bi-iliac atherosclerotic disease Seen on CT study Lipid panel: LDL 99 Chol 183 TG 288 HDL 26 P Given that pt is >40 yrs of age has a h/o DM Started Atorvastatin 40 mg to be continued on discharge  Severe morbid obesity BMI 40 Recommend weight loss outpatient with regular physical activity and healthy dieting   Code Status: Full code  Family Communication: None at bedside  Disposition Plan: Possible discharge to home tomorrow 12/20/2018.   Consultants:  PCCM  Urology  Vascular surgery on 12/22/2018.    Antimicrobials:  Cefdinir  DVT prophylaxis: Subcu heparin 3 times daily   Objective: Vitals:   12/22/18 1400 12/22/18 2111 12/23/18 0425 12/23/18 1220  BP: (!) 97/54 (!) 85/62 93/61 100/62  Pulse: 63 (!) 56 (!) 53 (!) 57  Resp:  18 19 17   Temp: 97.8 F (36.6 C) 97.6 F (36.4 C) 98.2 F (36.8 C) 98.6 F (37 C)  TempSrc: Oral Oral Oral Oral  SpO2: 96% 93% 93% 97%  Weight:   97.3 kg   Height:        Intake/Output Summary (Last 24 hours) at 12/23/2018 1333 Last data filed at 12/23/2018 1118 Gross per  24 hour  Intake 975.96 ml  Output 1700 ml  Net -724.04 ml   Filed Weights   12/21/18 0534 12/22/18 0451 12/23/18 0425  Weight: 97.3 kg 97.2 kg 97.3 kg    Exam:  . General: 53 y.o. year-old female obese in no acute distress.  Alert oriented x3.   . Cardiovascular: Regular rate and rhythm no rubs or gallops no JVD or  thyromegaly noted.   Marland Kitchen Respiratory: Clear to auscultation no wheezes no rales. Poor inspiratory effort.   Abdomen: Obese nontender nondistended normal bowel sounds present.   Musculoskeletal: 2/4 pulses in all 4 extremities. Psychiatry: Mood is appropriate for condition and setting.   Data Reviewed: CBC: Recent Labs  Lab 12/22/18 0823  WBC 11.6*  HGB 12.1  HCT 39.3  MCV 102.6*  PLT 0000000*   Basic Metabolic Panel: Recent Labs  Lab 12/18/18 0549 12/19/18 0500 12/20/18 0402 12/22/18 0823  NA 134* 136 138 138  K 3.5 3.5 4.1 4.3  CL 97* 98 102 102  CO2 28 31 26 23   GLUCOSE 139* 138* 126* 128*  BUN 24* 15 20 23*  CREATININE 1.38* 1.19* 1.27* 1.34*  CALCIUM 8.8* 8.8* 9.0 9.2   GFR: Estimated Creatinine Clearance: 53.5 mL/min (A) (by C-G formula based on SCr of 1.34 mg/dL (H)). Liver Function Tests: No results for input(s): AST, ALT, ALKPHOS, BILITOT, PROT, ALBUMIN in the last 168 hours. No results for input(s): LIPASE, AMYLASE in the last 168 hours. No results for input(s): AMMONIA in the last 168 hours. Coagulation Profile: No results for input(s): INR, PROTIME in the last 168 hours. Cardiac Enzymes: No results for input(s): CKTOTAL, CKMB, CKMBINDEX, TROPONINI in the last 168 hours. BNP (last 3 results) No results for input(s): PROBNP in the last 8760 hours. HbA1C: No results for input(s): HGBA1C in the last 72 hours. CBG: Recent Labs  Lab 12/22/18 1109 12/22/18 1623 12/22/18 2114 12/23/18 0739 12/23/18 1116  GLUCAP 188* 161* 291* 134* 115*   Lipid Profile: No results for input(s): CHOL, HDL, LDLCALC, TRIG, CHOLHDL, LDLDIRECT in the last 72 hours. Thyroid Function Tests: No results for input(s): TSH, T4TOTAL, FREET4, T3FREE, THYROIDAB in the last 72 hours. Anemia Panel: No results for input(s): VITAMINB12, FOLATE, FERRITIN, TIBC, IRON, RETICCTPCT in the last 72 hours. Urine analysis:    Component Value Date/Time   COLORURINE YELLOW 12/14/2018 2030    APPEARANCEUR CLOUDY (A) 12/14/2018 2030   LABSPEC 1.016 12/14/2018 2030   PHURINE 6.0 12/14/2018 2030   GLUCOSEU NEGATIVE 12/14/2018 2030   HGBUR MODERATE (A) 12/14/2018 2030   [place] NEGATIVE 12/14/2018 2030   [place] 12/14/2018 2030   PROTEINUR 100 (A) 12/14/2018 2030   UROBILINOGEN 0.2 03/31/2010 0246   NITRITE NEGATIVE 12/14/2018 2030   LEUKOCYTESUR LARGE (A) 12/14/2018 2030   Sepsis Labs: @LABRCNTIP (procalcitonin:4,lacticidven:4)  ) Recent Results (from the past 240 hour(s))  Blood Culture (routine x 2)     Status: Abnormal   Collection Time: 12/15/18 12:16 AM   Specimen: BLOOD  Result Value Ref Range Status   Specimen Description BLOOD RIGHT FOREARM  Final   Special Requests   Final    BOTTLES DRAWN AEROBIC AND ANAEROBIC Blood Culture adequate volume   Culture  Setup Time   Final    GRAM NEGATIVE RODS IN BOTH AEROBIC AND ANAEROBIC BOTTLES CRITICAL RESULT CALLED TO, READ BACK BY AND VERIFIED WITHTillman Sers [facility] [id number] 12/15/18 A BROWNING Performed at [facility], [place] [address], [place], [place] [address]  Culture ESCHERICHIA COLI (A)  Final   Report Status 12/17/2018 FINAL  Final   Organism ID, Bacteria ESCHERICHIA COLI  Final      Susceptibility   Escherichia coli - MIC*    AMPICILLIN >=32 RESISTANT Resistant     CEFAZOLIN 16 SENSITIVE Sensitive     CEFEPIME <=1 SENSITIVE Sensitive     CEFTAZIDIME <=1 SENSITIVE Sensitive     CEFTRIAXONE <=1 SENSITIVE Sensitive     CIPROFLOXACIN >=4 RESISTANT Resistant     GENTAMICIN <=1 SENSITIVE Sensitive     IMIPENEM <=0.25 SENSITIVE Sensitive     TRIMETH/SULFA <=20 SENSITIVE Sensitive     AMPICILLIN/SULBACTAM >=32 RESISTANT Resistant     PIP/TAZO <=4 SENSITIVE Sensitive     Extended ESBL NEGATIVE Sensitive     * ESCHERICHIA COLI  Blood Culture ID Panel (Reflexed)     Status: Abnormal   Collection Time: 12/15/18 12:16 AM  Result Value Ref Range Status   Enterococcus species NOT DETECTED NOT DETECTED  Final   Listeria monocytogenes NOT DETECTED NOT DETECTED Final   Staphylococcus species NOT DETECTED NOT DETECTED Final   Staphylococcus aureus (BCID) NOT DETECTED NOT DETECTED Final   Streptococcus species NOT DETECTED NOT DETECTED Final   Streptococcus agalactiae NOT DETECTED NOT DETECTED Final   Streptococcus pneumoniae NOT DETECTED NOT DETECTED Final   Streptococcus pyogenes NOT DETECTED NOT DETECTED Final   Acinetobacter baumannii NOT DETECTED NOT DETECTED Final   Enterobacteriaceae species DETECTED (A) NOT DETECTED Final    Comment: Enterobacteriaceae represent a large family of gram-negative bacteria, not a single organism. CRITICAL RESULT CALLED TO, READ BACK BY AND VERIFIED WITH: Tillman Sers PHARMD [id number] 12/15/18 A BROWNING    Enterobacter cloacae complex NOT DETECTED NOT DETECTED Final   Escherichia coli DETECTED (A) NOT DETECTED Final    Comment: CRITICAL RESULT CALLED TO, READ BACK BY AND VERIFIED WITH: Tillman Sers PHARMD [id number] 12/15/18 A BROWNING    Klebsiella oxytoca NOT DETECTED NOT DETECTED Final   Klebsiella pneumoniae NOT DETECTED NOT DETECTED Final   Proteus species NOT DETECTED NOT DETECTED Final   Serratia marcescens NOT DETECTED NOT DETECTED Final   Carbapenem resistance NOT DETECTED NOT DETECTED Final   Haemophilus influenzae NOT DETECTED NOT DETECTED Final   Neisseria meningitidis NOT DETECTED NOT DETECTED Final   Pseudomonas aeruginosa NOT DETECTED NOT DETECTED Final   Candida albicans NOT DETECTED NOT DETECTED Final   Candida glabrata NOT DETECTED NOT DETECTED Final   Candida krusei NOT DETECTED NOT DETECTED Final   Candida parapsilosis NOT DETECTED NOT DETECTED Final   Candida tropicalis NOT DETECTED NOT DETECTED Final    Comment: Performed at [facility], [place] [address] [place], [place] [address]  SARS Coronavirus 2 [facility] order, Performed in [facility] hospital lab) Nasopharyngeal Nasopharyngeal Swab     Status: None   Collection Time: 12/15/18 12:19  AM   Specimen: Nasopharyngeal Swab  Result Value Ref Range Status   SARS Coronavirus 2 NEGATIVE NEGATIVE Final    Comment: (NOTE) If result is NEGATIVE SARS-CoV-2 target nucleic acids are NOT DETECTED. The SARS-CoV-2 RNA is generally detectable in upper and lower  respiratory specimens during the acute phase of infection. The lowest  concentration of SARS-CoV-2 viral copies this assay can detect is 250  copies / mL. A negative result does not preclude SARS-CoV-2 infection  and should not be used as the sole basis for treatment or other  patient management decisions.  A negative result may occur with  improper specimen  collection / handling, submission of specimen other  than nasopharyngeal swab, presence of viral mutation(s) within the  areas targeted by this assay, and inadequate number of viral copies  (<250 copies / mL). A negative result must be combined with clinical  observations, patient history, and epidemiological information. If result is POSITIVE SARS-CoV-2 target nucleic acids are DETECTED. The SARS-CoV-2 RNA is generally detectable in upper and lower  respiratory specimens dur ing the acute phase of infection.  Positive  results are indicative of active infection with SARS-CoV-2.  Clinical  correlation with patient history and other diagnostic information is  necessary to determine patient infection status.  Positive results do  not rule out bacterial infection or co-infection with other viruses. If result is PRESUMPTIVE POSTIVE SARS-CoV-2 nucleic acids MAY BE PRESENT.   A presumptive positive result was obtained on the submitted specimen  and confirmed on repeat testing.  While 2019 novel coronavirus  (SARS-CoV-2) nucleic acids may be present in the submitted sample  additional confirmatory testing may be necessary for epidemiological  and / or clinical management purposes  to differentiate between  SARS-CoV-2 and other Sarbecovirus currently known to infect humans.   If clinically indicated additional testing with an alternate test  methodology [phone]) is advised. The SARS-CoV-2 RNA is generally  detectable in upper and lower respiratory sp ecimens during the acute  phase of infection. The expected result is Negative. Fact Sheet for Patients:  StrictlyIdeas.no Fact Sheet for Healthcare Providers: BankingDealers.co.za This test is not yet approved or cleared by the [place] FDA and has been authorized for detection and/or diagnosis of SARS-CoV-2 by FDA under an Emergency Use Authorization (EUA).  This EUA will remain in effect (meaning this test can be used) for the duration of the COVID-19 declaration under Section 564(b)(1) of the Act, 21 U.S.C. section 360bbb-3(b)(1), unless the authorization is terminated or revoked sooner. Performed at [facility], [place] [address] [place], [place] [address]   Blood Culture (routine x 2)     Status: None   Collection Time: 12/15/18  2:26 AM   Specimen: BLOOD  Result Value Ref Range Status   Specimen Description BLOOD RIGHT ARM  Final   Special Requests   Final    BOTTLES DRAWN AEROBIC AND ANAEROBIC Blood Culture adequate volume   Culture   Final    NO GROWTH 5 DAYS Performed at [facility], [place] [address] [place], [place] [address]    Report Status 12/20/2018 FINAL  Final  MRSA PCR Screening     Status: None   Collection Time: 12/15/18  6:35 AM   Specimen: Nasopharyngeal  Result Value Ref Range Status   MRSA by PCR NEGATIVE NEGATIVE Final    Comment:        The GeneXpert MRSA Assay (FDA approved for NASAL specimens only), is one component of a comprehensive MRSA colonization surveillance program. It is not intended to diagnose MRSA infection nor to guide or monitor treatment for MRSA infections. Performed at [facility], [place] [address] [place], [place] [address]   Urine culture     Status: Abnormal   Collection Time:  12/15/18  8:55 AM   Specimen: In/Out Cath Urine  Result Value Ref Range Status   Specimen Description IN/OUT CATH URINE  Final   Special Requests   Final    NONE Performed at [facility], [place] [address] [place], [place] [address]    Culture (A)  Final  20,000 COLONIES/mL ENTEROCOCCUS FAECALIS 2,000 COLONIES/mL ESCHERICHIA COLI    Report Status 12/17/2018 FINAL  Final   Organism ID, Bacteria ENTEROCOCCUS FAECALIS (A)  Final   Organism ID, Bacteria ESCHERICHIA COLI (A)  Final      Susceptibility   Escherichia coli - MIC*    AMPICILLIN >=32 RESISTANT Resistant     CEFAZOLIN <=4 SENSITIVE Sensitive     CEFTRIAXONE <=1 SENSITIVE Sensitive     CIPROFLOXACIN >=4 RESISTANT Resistant     GENTAMICIN <=1 SENSITIVE Sensitive     IMIPENEM <=0.25 SENSITIVE Sensitive     NITROFURANTOIN <=16 SENSITIVE Sensitive     TRIMETH/SULFA <=20 SENSITIVE Sensitive     AMPICILLIN/SULBACTAM 16 INTERMEDIATE Intermediate     PIP/TAZO <=4 SENSITIVE Sensitive     Extended ESBL NEGATIVE Sensitive     * 2,000 COLONIES/mL ESCHERICHIA COLI   Enterococcus faecalis - MIC*    AMPICILLIN <=2 SENSITIVE Sensitive     LEVOFLOXACIN 2 SENSITIVE Sensitive     NITROFURANTOIN <=16 SENSITIVE Sensitive     VANCOMYCIN 1 SENSITIVE Sensitive     * 20,000 COLONIES/mL ENTEROCOCCUS FAECALIS  Culture, blood (routine x 2)     Status: None   Collection Time: 12/17/18 10:13 AM   Specimen: BLOOD RIGHT ARM  Result Value Ref Range Status   Specimen Description BLOOD RIGHT ARM  Final   Special Requests   Final    AEROBIC BOTTLE ONLY Blood Culture results may not be optimal due to an inadequate volume of blood received in culture bottles   Culture   Final    NO GROWTH 5 DAYS Performed at [place] [address] [place], [place] [address]    Report Status 12/22/2018 FINAL  Final  Culture, blood (routine x 2)     Status: None   Collection Time: 12/17/18 10:18 AM   Specimen: BLOOD LEFT ARM  Result Value Ref Range  Status   Specimen Description BLOOD LEFT ARM  Final   Special Requests AEROBIC BOTTLE ONLY Blood Culture adequate volume  Final   Culture   Final    NO GROWTH 5 DAYS Performed at [facility], [place] [address], [place], [place] [address]    Report Status 12/22/2018 FINAL  Final      Studies: No results found.  Scheduled Meds: . [MAR Hold] aspirin EC  81 mg Oral Daily  . [MAR Hold] atorvastatin  80 mg Oral q1800  . [MAR Hold] cefdinir  300 mg Oral Q12H  . [MAR Hold] Chlorhexidine Gluconate Cloth  6 each Topical Daily  . [MAR Hold] clopidogrel  75 mg Oral Daily  . gabapentin  300 mg Oral BID  . [MAR Hold] heparin  5,000 Units Subcutaneous Q8H  . [MAR Hold] insulin aspart  0-5 Units Subcutaneous QHS  . [MAR Hold] insulin aspart  0-9 Units Subcutaneous TID WC  . [MAR Hold] insulin glargine  15 Units Subcutaneous QHS  . [MAR Hold] mouth rinse  15 mL Mouth Rinse BID  . [MAR Hold] opium-belladonna  1 suppository Rectal Once  . [MAR Hold] oxybutynin  2.5 mg Oral TID  . [MAR Hold] polyethylene glycol  17 g Oral Daily  . [MAR Hold] senna-docusate  2 tablet Oral BID  . [MAR Hold] sodium chloride flush  3 mL Intravenous Once    Continuous Infusions: . sodium chloride 250 mL (12/15/18 1816)  . sodium chloride 20 mL/hr at 12/23/18 0000     LOS: 8 days     Archie Patten  Malachi Carl, MD [facility] Pager [phone]  If 7PM-7AM, please contact night-coverage [email or web address] Password [email or web address] 12/23/2018, 1:33 PM

## 2018-12-23 NOTE — Progress Notes (Signed)
BP now 105/72 after IV  Bolus. Will continue to monitor pt.

## 2018-12-24 LAB — GLUCOSE, CAPILLARY
Glucose-Capillary: 127 mg/dL — ABNORMAL HIGH (ref 70–99)
Glucose-Capillary: 188 mg/dL — ABNORMAL HIGH (ref 70–99)
Glucose-Capillary: 163 mg/dL — ABNORMAL HIGH (ref 70–99)
Glucose-Capillary: 178 mg/dL — ABNORMAL HIGH (ref 70–99)

## 2018-12-24 NOTE — Progress Notes (Signed)
PROGRESS NOTE  Pamela Carney [id number] DOB: 01/26/1966 DOA: 12/14/2018 PCP: Patient, No Pcp Per  HPI/Recap of past 24 hours: 53 yr old F w/ PMHx sig for nephrolithiasis and Bipolar disorder presented to [facility] w/ abdominal pain and emesis found to have a 11 mm obtsructng stone.Septic shock. POD 1 Cystoscopy, right retrograde ureteropyelogram, fluoroscopic interpretation, right double-J stent placement. 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 Per EDP documentation the patient stated that these symptoms began three days ago and have become progressively worse. She also reported subjective fevers, and a non productive cough.   POD 1 Cystoscopy, right retrograde ureteropyelogram, fluoroscopic interpretation, right double-J stent placement. Seen Overnight laying in bed. Ambulates to rest room. Only required severe pain meds once overnight post void. Tolerated diet. Off of Levophed gtt for several hours and remains hemodynamically stable.   Transferred to [facility] service on 12/17/2018.   Vaginal candidiasis treated on 12/18/18 with 1 dose p.o. fluconazole 200 mg once.   Reports ureteral spasm and discomfort of lower abdomen on 12/19/2018, started on Pyridium.   Hospital course complicated by persistent right eye discomfort which prompted ophthalmology consult.  Recommendation for MRI brain.  Completed on 12/21/2018, showed acute/subacute ischemic CVA involving left medial occipital lobe.  Seen by stroke team.  Discussed with Dr. Talbert Forest, will follow-up with ophthalmology outpatient.  12/23/18: Post TEE, no valvular vegetations.  Post implantable loop recorder left chest.  12/24/18: Patient was seen and examined at her  bedside this morning.  Wearing her CPAP.  Hypotensive overnight, received IV fluid bolus with improvement.  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 Stroke work-up: LDL 99 with goal less than 70 A1c 8.6 with goal less than 7.0 2D echo done on 12/15/2018 showed normal LVEF with no evidence of PFO Continue high-dose aspirin 325 mg daily and Lipitor 80 mg daily Twelve-lead EKG done on 12/21/2018 showed normal sinus rhythm Continue normal saline at 50 cc/h x 1 day Bilateral carotid Doppler ultrasound showed right ICA 60 to 79% stenosis, left ICA 40 to 59% stenosis Vascular surgery Dr. Donzetta Matters, seen with plan for aggressive medical management and follow-up outpatient in 6 months with carotid duplex in the office. Started on aspirin and Plavix as recommended by neurology TEE completed on 12/23/2018 endocarditis ruled out. Implantable Loop recorder placed on 12/23/18 for cryptogenic CVA to rule out paroxysmal A. Fib.  Newly diagnosed OSA Currently ongoing participation in sleep SMART study Continue CPAP at night  Bilateral carotid artery stenosis right greater than left Doppler ultrasound revealed right ICA 60 to 79% stenosis, left ICA 40 to 59% stenosis Moderate/cerebral Continue aspirin 81 mg daily Plavix 75 mg daily and Lipitor 80 mg daily Will follow-up with vascular surgery outpatient in 6 months  Polyneuropathy likely secondary to uncontrolled diabetes with hyperglycemia Start gabapentin 300 mg twice daily Follow-up with PCP  CKD 3 Baseline creatinine appears to be 1.1 with GFR 52 Creatinine 1.34 on 12/22/2018 with GFR of 45 Continue to  avoid nephrotoxins Continue to  avoid hypotension Continue gentle IV fluid hydration normal saline at 75 cc/h Monitor urine output Repeat BMP in the morning  Resolving sepsis with recent septic shock secondary to complicated Enterococcus faecalis/E. coli UTI in the setting of right distal ureteral calculus with pyelonephrosis and E. coli bacteremia Off pressors Urine culture taken on 12/15/2018 grew 20,000 colonies of Enterococcus faecalis, 2000 colonies of E. coli Resistance to ampicillin and ciprofloxacin intermediate resistance to ampicillin/sulbactam Completed 5 days of Rocephin Switch to Keflex 500 mg 3 times daily on 12/20/18 x5 days  Right homonymous hemianopsia /right eye discomfort secondary to acute/subacute ischemic CVA involving left medial occipital lobe States she has been seeing rings with her right eye and is causing dizziness Seen by ophthalmology Dr. Talbert Forest who recommended brain MRI. Informed Dr. Talbert Forest of new findings; follow-up with ophthalmology Dr. Talbert Forest outpatient.  E. coli bacteremia, poa Management for stated above Repeated blood cultures x2 done on 12/17/2018- to date Currently on cefdinir We will complete total of 10 days of antibiotics TEE planned today to rule out endocarditis  UTI with LUTS, POA Management as stated above  Ureteral/bladder spasm Continue Pyridium 100 mg 3 times daily x3 days Start oxybutynin and continue until seen at the neurology clinic Obtain KUB to assure proper placement of stent  Vaginal candidiasis Ordered 1 dose of fluconazole 200 mg once  Resolving prerenal AKI with IV fluid hydration Appears to be at her baseline creatinine 1.1 with GFR of 52 Presented with creatinine of 2.45 and GFR 22 DC IV fluid Continue to avoid nephrotoxins such as NSAIDs Good urine output greater than 3.9 L recorded in the last 24 hours  E. coli bacteremia likely from urinary source Blood cultures drawn on 12/15/2018 1 out of 2 bottles grew E. coli Resistant to ampicillin,  ampicillin/sulbactam, ciprofloxacin Completed 5 days of Rocephin Repeat blood culture x2 peripherally on 12/17/18 no growth to date Remove central line right IJ on 12/19/2018   Right distal ureteral calculus with pyonephrosis status post right cystoscopy and stent with urology on 12/15/2018 Continue to optimize pain control Reports persistent spasm; will contact urology.  Type 2 diabetes with hyperglycemia Blood sugar is better controlled Hemoglobin A1c 8.6 on 12/15/2018 Continue insulin coverage Continue to hold off oral anti-glycemic's  Physical debility: Improving PT OT had no further recommendations Continue to mobilize Fall precautions  History of type I bipolar disorder Currently not on any medications.  Pharmacy review  Moderate aorto bi-iliac atherosclerotic disease Seen on CT study Lipid panel: LDL 99 Chol 183 TG 288 HDL 26 P Given that pt is >40 yrs of age has a h/o DM Started Atorvastatin 40 mg to be continued on discharge  Severe morbid obesity BMI 40 Recommend weight loss outpatient with regular physical activity and healthy dieting   Code Status: Full code  Family Communication: None at bedside  Disposition Plan: Possible discharge to home tomorrow 12/20/2018.   Consultants:  PCCM  Urology  Vascular surgery on 12/22/2018.    Antimicrobials:  Cefdinir  DVT prophylaxis: Subcu heparin 3 times daily   Objective: Vitals:   12/23/18 2019 12/23/18 2207 12/24/18 0448 12/24/18 0810  BP: (!) 83/59 105/72 101/85   Pulse:  71 75   Resp: 18 16 20 20   Temp: 97.6 F (36.4 C)  97.6 F (36.4 C)   TempSrc: Oral  Oral   SpO2: 98% 98% 96%   Weight:   96.7 kg   Height:  Intake/Output Summary (Last 24 hours) at 12/24/2018 1237 Last data filed at 12/24/2018 0300 Gross per 24 hour  Intake 240 ml  Output 1325 ml  Net -1085 ml   Filed Weights   12/22/18 0451 12/23/18 0425 12/24/18 0448  Weight: 97.2 kg 97.3 kg 96.7 kg    Exam:  . General: 53  y.o. year-old female obese in no acute distress.  Alert and oriented x3.  Wearing CPAP from sleep smart study.  Cardiovascular: Regular rate and rhythm no rubs or gallops no JVD or thyromegaly noted.   Respiratory: Clear to auscultation no wheezes or rales.  Poor inspiratory effort.   Abdomen: Obese nontender nondistended bowel sounds present.  Musculoskeletal: 2 out of 4 pulses in all 4 extremities.   Psychiatry: Mood is appropriate for condition and setting.   Data Reviewed: CBC: Recent Labs  Lab 12/22/18 0823  WBC 11.6*  HGB 12.1  HCT 39.3  MCV 102.6*  PLT 0000000*   Basic Metabolic Panel: Recent Labs  Lab 12/18/18 0549 12/19/18 0500 12/20/18 0402 12/22/18 0823  NA 134* 136 138 138  K 3.5 3.5 4.1 4.3  CL 97* 98 102 102  CO2 28 31 26 23   GLUCOSE 139* 138* 126* 128*  BUN 24* 15 20 23*  CREATININE 1.38* 1.19* 1.27* 1.34*  CALCIUM 8.8* 8.8* 9.0 9.2   GFR: Estimated Creatinine Clearance: 53.3 mL/min (A) (by C-G formula based on SCr of 1.34 mg/dL (H)). Liver Function Tests: No results for input(s): AST, ALT, ALKPHOS, BILITOT, PROT, ALBUMIN in the last 168 hours. No results for input(s): LIPASE, AMYLASE in the last 168 hours. No results for input(s): AMMONIA in the last 168 hours. Coagulation Profile: No results for input(s): INR, PROTIME in the last 168 hours. Cardiac Enzymes: No results for input(s): CKTOTAL, CKMB, CKMBINDEX, TROPONINI in the last 168 hours. BNP (last 3 results) No results for input(s): PROBNP in the last 8760 hours. HbA1C: No results for input(s): HGBA1C in the last 72 hours. CBG: Recent Labs  Lab 12/23/18 1116 12/23/18 1633 12/23/18 2125 12/24/18 0755 12/24/18 1129  GLUCAP 115* 118* 173* 127* 188*   Lipid Profile: No results for input(s): CHOL, HDL, LDLCALC, TRIG, CHOLHDL, LDLDIRECT in the last 72 hours. Thyroid Function Tests: No results for input(s): TSH, T4TOTAL, FREET4, T3FREE, THYROIDAB in the last 72 hours. Anemia Panel: No results for  input(s): VITAMINB12, FOLATE, FERRITIN, TIBC, IRON, RETICCTPCT in the last 72 hours. Urine analysis:    Component Value Date/Time   COLORURINE YELLOW 12/14/2018 2030   APPEARANCEUR CLOUDY (A) 12/14/2018 2030   LABSPEC 1.016 12/14/2018 2030   PHURINE 6.0 12/14/2018 2030   GLUCOSEU NEGATIVE 12/14/2018 2030   HGBUR MODERATE (A) 12/14/2018 2030   [place] NEGATIVE 12/14/2018 2030   [place] 12/14/2018 2030   PROTEINUR 100 (A) 12/14/2018 2030   UROBILINOGEN 0.2 03/31/2010 0246   NITRITE NEGATIVE 12/14/2018 2030   LEUKOCYTESUR LARGE (A) 12/14/2018 2030   Sepsis Labs: @LABRCNTIP (procalcitonin:4,lacticidven:4)  ) Recent Results (from the past 240 hour(s))  Blood Culture (routine x 2)     Status: Abnormal   Collection Time: 12/15/18 12:16 AM   Specimen: BLOOD  Result Value Ref Range Status   Specimen Description BLOOD RIGHT FOREARM  Final   Special Requests   Final    BOTTLES DRAWN AEROBIC AND ANAEROBIC Blood Culture adequate volume   Culture  Setup Time   Final    GRAM NEGATIVE RODS IN BOTH AEROBIC AND ANAEROBIC BOTTLES CRITICAL RESULT CALLED TO, READ BACK BY AND  VERIFIED WITHTillman Sers [facility] [id number] 12/15/18 A BROWNING Performed at [facility], [place] [address], [place], [place] [address]    Culture ESCHERICHIA COLI (A)  Final   Report Status 12/17/2018 FINAL  Final   Organism ID, Bacteria ESCHERICHIA COLI  Final      Susceptibility   Escherichia coli - MIC*    AMPICILLIN >=32 RESISTANT Resistant     CEFAZOLIN 16 SENSITIVE Sensitive     CEFEPIME <=1 SENSITIVE Sensitive     CEFTAZIDIME <=1 SENSITIVE Sensitive     CEFTRIAXONE <=1 SENSITIVE Sensitive     CIPROFLOXACIN >=4 RESISTANT Resistant     GENTAMICIN <=1 SENSITIVE Sensitive     IMIPENEM <=0.25 SENSITIVE Sensitive     TRIMETH/SULFA <=20 SENSITIVE Sensitive     AMPICILLIN/SULBACTAM >=32 RESISTANT Resistant     PIP/TAZO <=4 SENSITIVE Sensitive     Extended ESBL NEGATIVE Sensitive     * ESCHERICHIA COLI   Blood Culture ID Panel (Reflexed)     Status: Abnormal   Collection Time: 12/15/18 12:16 AM  Result Value Ref Range Status   Enterococcus species NOT DETECTED NOT DETECTED Final   Listeria monocytogenes NOT DETECTED NOT DETECTED Final   Staphylococcus species NOT DETECTED NOT DETECTED Final   Staphylococcus aureus (BCID) NOT DETECTED NOT DETECTED Final   Streptococcus species NOT DETECTED NOT DETECTED Final   Streptococcus agalactiae NOT DETECTED NOT DETECTED Final   Streptococcus pneumoniae NOT DETECTED NOT DETECTED Final   Streptococcus pyogenes NOT DETECTED NOT DETECTED Final   Acinetobacter baumannii NOT DETECTED NOT DETECTED Final   Enterobacteriaceae species DETECTED (A) NOT DETECTED Final    Comment: Enterobacteriaceae represent a large family of gram-negative bacteria, not a single organism. CRITICAL RESULT CALLED TO, READ BACK BY AND VERIFIED WITH: Tillman Sers PHARMD [id number] 12/15/18 A BROWNING    Enterobacter cloacae complex NOT DETECTED NOT DETECTED Final   Escherichia coli DETECTED (A) NOT DETECTED Final    Comment: CRITICAL RESULT CALLED TO, READ BACK BY AND VERIFIED WITH: Tillman Sers PHARMD [id number] 12/15/18 A BROWNING    Klebsiella oxytoca NOT DETECTED NOT DETECTED Final   Klebsiella pneumoniae NOT DETECTED NOT DETECTED Final   Proteus species NOT DETECTED NOT DETECTED Final   Serratia marcescens NOT DETECTED NOT DETECTED Final   Carbapenem resistance NOT DETECTED NOT DETECTED Final   Haemophilus influenzae NOT DETECTED NOT DETECTED Final   Neisseria meningitidis NOT DETECTED NOT DETECTED Final   Pseudomonas aeruginosa NOT DETECTED NOT DETECTED Final   Candida albicans NOT DETECTED NOT DETECTED Final   Candida glabrata NOT DETECTED NOT DETECTED Final   Candida krusei NOT DETECTED NOT DETECTED Final   Candida parapsilosis NOT DETECTED NOT DETECTED Final   Candida tropicalis NOT DETECTED NOT DETECTED Final    Comment: Performed at [facility], [place] [address]  [place], [place] [address]  SARS Coronavirus 2 [facility] order, Performed in [facility] hospital lab) Nasopharyngeal Nasopharyngeal Swab     Status: None   Collection Time: 12/15/18 12:19 AM   Specimen: Nasopharyngeal Swab  Result Value Ref Range Status   SARS Coronavirus 2 NEGATIVE NEGATIVE Final    Comment: (NOTE) If result is NEGATIVE SARS-CoV-2 target nucleic acids are NOT DETECTED. The SARS-CoV-2 RNA is generally detectable in upper and lower  respiratory specimens during the acute phase of infection. The lowest  concentration of SARS-CoV-2 viral copies this assay can detect is 250  copies / mL. A negative result does not preclude SARS-CoV-2 infection  and should  not be used as the sole basis for treatment or other  patient management decisions.  A negative result may occur with  improper specimen collection / handling, submission of specimen other  than nasopharyngeal swab, presence of viral mutation(s) within the  areas targeted by this assay, and inadequate number of viral copies  (<250 copies / mL). A negative result must be combined with clinical  observations, patient history, and epidemiological information. If result is POSITIVE SARS-CoV-2 target nucleic acids are DETECTED. The SARS-CoV-2 RNA is generally detectable in upper and lower  respiratory specimens dur ing the acute phase of infection.  Positive  results are indicative of active infection with SARS-CoV-2.  Clinical  correlation with patient history and other diagnostic information is  necessary to determine patient infection status.  Positive results do  not rule out bacterial infection or co-infection with other viruses. If result is PRESUMPTIVE POSTIVE SARS-CoV-2 nucleic acids MAY BE PRESENT.   A presumptive positive result was obtained on the submitted specimen  and confirmed on repeat testing.  While 2019 novel coronavirus  (SARS-CoV-2) nucleic acids may be present in the submitted sample  additional  confirmatory testing may be necessary for epidemiological  and / or clinical management purposes  to differentiate between  SARS-CoV-2 and other Sarbecovirus currently known to infect humans.  If clinically indicated additional testing with an alternate test  methodology [phone]) is advised. The SARS-CoV-2 RNA is generally  detectable in upper and lower respiratory sp ecimens during the acute  phase of infection. The expected result is Negative. Fact Sheet for Patients:  StrictlyIdeas.no Fact Sheet for Healthcare Providers: BankingDealers.co.za This test is not yet approved or cleared by the [place] FDA and has been authorized for detection and/or diagnosis of SARS-CoV-2 by FDA under an Emergency Use Authorization (EUA).  This EUA will remain in effect (meaning this test can be used) for the duration of the COVID-19 declaration under Section 564(b)(1) of the Act, 21 U.S.C. section 360bbb-3(b)(1), unless the authorization is terminated or revoked sooner. Performed at [facility], [place] [address], [place], [place] [address]   Blood Culture (routine x 2)     Status: None   Collection Time: 12/15/18  2:26 AM   Specimen: BLOOD  Result Value Ref Range Status   Specimen Description BLOOD RIGHT ARM  Final   Special Requests   Final    BOTTLES DRAWN AEROBIC AND ANAEROBIC Blood Culture adequate volume   Culture   Final    NO GROWTH 5 DAYS Performed at [facility], [place] [address] [place], [place] [address]    Report Status 12/20/2018 FINAL  Final  MRSA PCR Screening     Status: None   Collection Time: 12/15/18  6:35 AM   Specimen: Nasopharyngeal  Result Value Ref Range Status   MRSA by PCR NEGATIVE NEGATIVE Final    Comment:        The GeneXpert MRSA Assay (FDA approved for NASAL specimens only), is one component of a comprehensive MRSA colonization surveillance program. It is not intended to diagnose MRSA infection  nor to guide or monitor treatment for MRSA infections. Performed at [facility], [place] [address], [place], [place] [address]   Urine culture     Status: Abnormal   Collection Time: 12/15/18  8:55 AM   Specimen: In/Out Cath Urine  Result Value Ref Range Status   Specimen Description IN/OUT CATH URINE  Final   Special Requests   Final  NONE Performed at [facility], [place] [address], [place], [place] [address]    Culture (A)  Final    20,000 COLONIES/mL ENTEROCOCCUS FAECALIS 2,000 COLONIES/mL ESCHERICHIA COLI    Report Status 12/17/2018 FINAL  Final   Organism ID, Bacteria ENTEROCOCCUS FAECALIS (A)  Final   Organism ID, Bacteria ESCHERICHIA COLI (A)  Final      Susceptibility   Escherichia coli - MIC*    AMPICILLIN >=32 RESISTANT Resistant     CEFAZOLIN <=4 SENSITIVE Sensitive     CEFTRIAXONE <=1 SENSITIVE Sensitive     CIPROFLOXACIN >=4 RESISTANT Resistant     GENTAMICIN <=1 SENSITIVE Sensitive     IMIPENEM <=0.25 SENSITIVE Sensitive     NITROFURANTOIN <=16 SENSITIVE Sensitive     TRIMETH/SULFA <=20 SENSITIVE Sensitive     AMPICILLIN/SULBACTAM 16 INTERMEDIATE Intermediate     PIP/TAZO <=4 SENSITIVE Sensitive     Extended ESBL NEGATIVE Sensitive     * 2,000 COLONIES/mL ESCHERICHIA COLI   Enterococcus faecalis - MIC*    AMPICILLIN <=2 SENSITIVE Sensitive     LEVOFLOXACIN 2 SENSITIVE Sensitive     NITROFURANTOIN <=16 SENSITIVE Sensitive     VANCOMYCIN 1 SENSITIVE Sensitive     * 20,000 COLONIES/mL ENTEROCOCCUS FAECALIS  Culture, blood (routine x 2)     Status: None   Collection Time: 12/17/18 10:13 AM   Specimen: BLOOD RIGHT ARM  Result Value Ref Range Status   Specimen Description BLOOD RIGHT ARM  Final   Special Requests   Final    AEROBIC BOTTLE ONLY Blood Culture results may not be optimal due to an inadequate volume of blood received in culture bottles   Culture   Final    NO GROWTH 5 DAYS Performed at [place] [address],  [place], [place] [address]    Report Status 12/22/2018 FINAL  Final  Culture, blood (routine x 2)     Status: None   Collection Time: 12/17/18 10:18 AM   Specimen: BLOOD LEFT ARM  Result Value Ref Range Status   Specimen Description BLOOD LEFT ARM  Final   Special Requests AEROBIC BOTTLE ONLY Blood Culture adequate volume  Final   Culture   Final    NO GROWTH 5 DAYS Performed at [facility], [place] [address] [place], [place] [address]    Report Status 12/22/2018 FINAL  Final      Studies: No results found.  Scheduled Meds: . aspirin EC  81 mg Oral Daily  . atorvastatin  80 mg Oral q1800  . cefdinir  300 mg Oral Q12H  . clopidogrel  75 mg Oral Daily  . gabapentin  300 mg Oral BID  . heparin  5,000 Units Subcutaneous Q8H  . insulin aspart  0-5 Units Subcutaneous QHS  . insulin aspart  0-9 Units Subcutaneous TID WC  . insulin glargine  15 Units Subcutaneous QHS  . mouth rinse  15 mL Mouth Rinse BID  . opium-belladonna  1 suppository Rectal Once  . oxybutynin  2.5 mg Oral TID  . polyethylene glycol  17 g Oral Daily  . senna-docusate  2 tablet Oral BID  . sodium chloride flush  3 mL Intravenous Once    Continuous Infusions: . sodium chloride 250 mL (12/15/18 1816)     LOS: 9 days     Kayleen Memos, MD [facility] Pager [phone]  If 7PM-7AM, please contact night-coverage [email or web address] Password [facility] 12/24/2018, 12:37 PM

## 2018-12-24 NOTE — Progress Notes (Signed)
Patient had a 7 beat run of wide complex tachycardia rate of 107.at 1047. Patient stated she was resting and had no symptoms. Denied palpitations, shortness of breath or any abnormal feelings in chest. Patient stated she felt tired and fatigued around the time of the arrhythmia and continues to feel tired. Resting in bed at this time.  No further complaints or arrhythmias today. Patient up in chair in the afternoon for a prolonged period of time with no complaints. Low blood pressures noted early in the shift but patient lying on left side and blood pressure cuff on right arm giving probable erroneous reading. Last blood pressure is 102/61.

## 2018-12-25 LAB — GLUCOSE, CAPILLARY
Glucose-Capillary: 125 mg/dL — ABNORMAL HIGH (ref 70–99)
Glucose-Capillary: 187 mg/dL — ABNORMAL HIGH (ref 70–99)
Glucose-Capillary: 166 mg/dL — ABNORMAL HIGH (ref 70–99)

## 2018-12-25 LAB — BASIC METABOLIC PANEL
Anion gap: 9 (ref 5–15)
BUN: 24 mg/dL — ABNORMAL HIGH (ref 6–20)
CO2: 21 mmol/L — ABNORMAL LOW (ref 22–32)
Calcium: 9.7 mg/dL (ref 8.9–10.3)
Chloride: 107 mmol/L (ref 98–111)
Creatinine, Ser: 1.19 mg/dL — ABNORMAL HIGH (ref 0.44–1.00)
GFR calc Af Amer: >60 mL/min (ref >60)
GFR calc non Af Amer: 52 mL/min — ABNORMAL LOW (ref >60)
Glucose, Bld: 173 mg/dL — ABNORMAL HIGH (ref 70–99)
Potassium: 4 mmol/L (ref 3.5–5.1)
Sodium: 137 mmol/L (ref 135–145)

## 2018-12-25 MED ORDER — OXYCODONE HCL 5 MG PO TABS: 5.0000 mg | ORAL_TABLET | Freq: Every day | ORAL | 0 refills | Status: DC | PRN

## 2018-12-25 MED ORDER — ASPIRIN 81 MG PO TBEC: 81.0000 mg | DELAYED_RELEASE_TABLET | Freq: Every day | ORAL | 0 refills | Status: DC

## 2018-12-25 MED ORDER — SENNOSIDES-DOCUSATE SODIUM 8.6-50 MG PO TABS: 2.0000 | ORAL_TABLET | Freq: Every evening | ORAL | 0 refills | Status: DC | PRN

## 2018-12-25 MED ORDER — OXYBUTYNIN CHLORIDE 5 MG PO TABS: 2.5000 mg | ORAL_TABLET | Freq: Two times a day (BID) | ORAL | 0 refills | Status: AC | PRN

## 2018-12-25 MED ORDER — GABAPENTIN 300 MG PO CAPS: 300.0000 mg | ORAL_CAPSULE | Freq: Two times a day (BID) | ORAL | 0 refills | Status: DC

## 2018-12-25 MED ORDER — ATORVASTATIN CALCIUM 80 MG PO TABS: 80.0000 mg | ORAL_TABLET | Freq: Every day | ORAL | 0 refills | Status: DC

## 2018-12-25 MED ORDER — METFORMIN HCL 500 MG PO TABS: 500.0000 mg | ORAL_TABLET | Freq: Two times a day (BID) | ORAL | 0 refills | Status: DC

## 2018-12-25 MED ORDER — INSULIN GLARGINE 100 UNIT/ML SOLOSTAR PEN: 15.0000 [IU] | PEN_INJECTOR | Freq: Every day | SUBCUTANEOUS | 0 refills | Status: DC

## 2018-12-25 MED ORDER — CLOPIDOGREL BISULFATE 75 MG PO TABS: 75.0000 mg | ORAL_TABLET | Freq: Every day | ORAL | 0 refills | Status: DC

## 2018-12-25 MED ORDER — OXYBUTYNIN CHLORIDE 5 MG PO TABS: 2.5000 mg | ORAL_TABLET | Freq: Two times a day (BID) | ORAL | 0 refills | Status: DC | PRN

## 2018-12-25 NOTE — Discharge Instructions (Addendum)
Stroke Prevention Some medical conditions and lifestyle choices can lead to a higher risk for a stroke. You can help to prevent a stroke by making nutrition, lifestyle, and other changes. What nutrition changes can be made?  Eat healthy foods. Choose foods that are high in fiber. These include: Fresh fruits. Fresh vegetables. Whole grains. Eat at least 5 or more servings of fruits and vegetables each day. Try to fill half of your plate at each meal with fruits and vegetables. Choose lean protein foods. These include: Lowfat (lean) cuts of meat. Chicken without skin. Fish. Tofu. Beans. Nuts. Eat low-fat dairy products. Avoid foods that: Are high in salt (sodium). Have saturated fat. Have trans fat. Have cholesterol. Are processed. Are premade. Follow eating guidelines as told by your doctor. These may include: Reducing how many calories you eat and drink each day. Limiting how much salt you eat or drink each day to 1,500 milligrams (mg). Using only healthy fats for cooking. These include: Olive oil. Canola oil. Sunflower oil. Counting how many carbohydrates you eat and drink each day. What lifestyle changes can be made? Try to stay at a healthy weight. Talk to your doctor about what a good weight is for you. Get at least 30 minutes of moderate physical activity at least 5 days a week. This can include: Fast walking. Biking. Swimming. Do not use any products that have nicotine or tobacco. This includes cigarettes and e-cigarettes. If you need help quitting, ask your doctor. Avoid being around tobacco smoke in general. Limit how much alcohol you drink to no more than 1 drink a day for nonpregnant women and 2 drinks a day for men. One drink equals 12 oz of beer, 5 oz of wine, or 1 oz of hard liquor. Do not use drugs. Avoid taking birth control pills. Talk to your doctor about the risks of taking birth control pills if: You are over 74 years old. You smoke. You get  migraines. You have had a blood clot. What other changes can be made? Manage your cholesterol. It is important to eat a healthy diet. If your cholesterol cannot be managed through your diet, you may also need to take medicines. Take medicines as told by your doctor. Manage your diabetes. It is important to eat a healthy diet and to exercise regularly. If your blood sugar cannot be managed through diet and exercise, you may need to take medicines. Take medicines as told by your doctor. Control your high blood pressure (hypertension). Try to keep your blood pressure below 130/80. This can help lower your risk of stroke. It is important to eat a healthy diet and to exercise regularly. If your blood pressure cannot be managed through diet and exercise, you may need to take medicines. Take medicines as told by your doctor. Ask your doctor if you should check your blood pressure at home. Have your blood pressure checked every year. Do this even if your blood pressure is normal. Talk to your doctor about getting checked for a sleep disorder. Signs of this can include: Snoring a lot. Feeling very tired. Take over-the-counter and prescription medicines only as told by your doctor. These may include aspirin or blood thinners (antiplatelets or anticoagulants). Make sure that any other medical conditions you have are managed. Where to find more information American Stroke Association: www.strokeassociation.org National Stroke Association: www.stroke.org Get help right away if: You have any symptoms of stroke. "BE FAST" is an easy way to remember the main warning signs: B - Balance.  Signs are dizziness, sudden trouble walking, or loss of balance. E - Eyes. Signs are trouble seeing or a sudden change in how you see. F - Face. Signs are sudden weakness or loss of feeling of the face, or the face or eyelid drooping on one side. A - Arms. Signs are weakness or loss of feeling in an arm. This happens  suddenly and usually on one side of the body. S - Speech. Signs are sudden trouble speaking, slurred speech, or trouble understanding what people say. T - Time. Time to call emergency services. Write down what time symptoms started. You have other signs of stroke, such as: A sudden, very bad headache with no known cause. Feeling sick to your stomach (nausea). Throwing up (vomiting). Jerky movements you cannot control (seizure). These symptoms may represent a serious problem that is an emergency. Do not wait to see if the symptoms will go away. Get medical help right away. Call your local emergency services (911 in the [place] Do not drive yourself to the hospital. Summary You can prevent a stroke by eating healthy, exercising, not smoking, drinking less alcohol, and treating other health problems, such as diabetes, high blood pressure, or high cholesterol. Do not use any products that contain nicotine or tobacco, such as cigarettes and e-cigarettes. Get help right away if you have any signs or symptoms of a stroke. This information is not intended to replace advice given to you by your health care provider. Make sure you discuss any questions you have with your health care provider. Document Released: 09/08/2011 Document Revised: 05/05/2018 Document Reviewed: 06/10/2016 Elsevier Patient Education  2020 [place]. Dietary Guidelines to Help Prevent Kidney Stones Kidney stones are deposits of minerals and salts that form inside your kidneys. Your risk of developing kidney stones may be greater depending on your diet, your lifestyle, the medicines you take, and whether you have certain medical conditions. Most people can reduce their chances of developing kidney stones by following the instructions below. Depending on your overall health and the type of kidney stones you tend to develop, your dietitian may give you more specific instructions. What are tips for following this plan? Reading food  labels  Choose foods with "no salt added" or "low-salt" labels. Limit your sodium intake to less than 1500 mg per day.  Choose foods with calcium for each meal and snack. Try to eat about 300 mg of calcium at each meal. Foods that contain 200-500 mg of calcium per serving include: ? 8 oz (237 ml) of milk, fortified nondairy milk, and fortified fruit juice. ? 8 oz (237 ml) of kefir, yogurt, and soy yogurt. ? 4 oz (118 ml) of tofu. ? 1 oz of cheese. ? 1 cup (300 g) of dried figs. ? 1 cup (91 g) of cooked broccoli. ? 1-3 oz can of sardines or mackerel.  Most people need 1000 to 1500 mg of calcium each day. Talk to your dietitian about how much calcium is recommended for you. Shopping  Buy plenty of fresh fruits and vegetables. Most people do not need to avoid fruits and vegetables, even if they contain nutrients that may contribute to kidney stones.  When shopping for convenience foods, choose: ? Whole pieces of fruit. ? Premade salads with dressing on the side. ? Low-fat fruit and yogurt smoothies.  Avoid buying frozen meals or prepared deli foods.  Look for foods with live cultures, such as yogurt and kefir. Cooking  Do not add salt to food when cooking.  Place a salt shaker on the table and allow each person to add his or her own salt to taste.  Use vegetable protein, such as beans, textured vegetable protein (TVP), or tofu instead of meat in pasta, casseroles, and soups. Meal planning   Eat less salt, if told by your dietitian. To do this: ? Avoid eating processed or premade food. ? Avoid eating fast food.  Eat less animal protein, including cheese, meat, poultry, or fish, if told by your dietitian. To do this: ? Limit the number of times you have meat, poultry, fish, or cheese each week. Eat a diet free of meat at least 2 days a week. ? Eat only one serving each day of meat, poultry, fish, or seafood. ? When you prepare animal protein, cut pieces into small portion sizes.  For most meat and fish, one serving is about the size of one deck of cards.  Eat at least 5 servings of fresh fruits and vegetables each day. To do this: ? Keep fruits and vegetables on hand for snacks. ? Eat 1 piece of fruit or a handful of berries with breakfast. ? Have a salad and fruit at lunch. ? Have two kinds of vegetables at dinner.  Limit foods that are high in a substance called oxalate. These include: ? Spinach. ? Rhubarb. ? Beets. ? Potato chips and french fries. ? Nuts.  If you regularly take a diuretic medicine, make sure to eat at least 1-2 fruits or vegetables high in potassium each day. These include: ? Avocado. ? Banana. ? Orange, prune, carrot, or tomato juice. ? Baked potato. ? Cabbage. ? Beans and split peas. General instructions   Drink enough fluid to keep your urine clear or pale yellow. This is the most important thing you can do.  Talk to your health care provider and dietitian about taking daily supplements. Depending on your health and the cause of your kidney stones, you may be advised: ? Not to take supplements with vitamin C. ? To take a calcium supplement. ? To take a daily probiotic supplement. ? To take other supplements such as magnesium, fish oil, or vitamin B6.  Take all medicines and supplements as told by your health care provider.  Limit alcohol intake to no more than 1 drink a day for nonpregnant women and 2 drinks a day for men. One drink equals 12 oz of beer, 5 oz of wine, or 1 oz of hard liquor.  Lose weight if told by your health care provider. Work with your dietitian to find strategies and an eating plan that works best for you. What foods are not recommended? Limit your intake of the following foods, or as told by your dietitian. Talk to your dietitian about specific foods you should avoid based on the type of kidney stones and your overall health. Grains Breads. Bagels. Rolls. Baked goods. Salted crackers. Cereal.  Pasta. Vegetables Spinach. Rhubarb. Beets. Canned vegetables. Angie Fava. Olives. Meats and other protein foods Nuts. Nut butters. Large portions of meat, poultry, or fish. Salted or cured meats. Deli meats. Hot dogs. Sausages. Dairy Cheese. Beverages Regular soft drinks. Regular vegetable juice. Seasonings and other foods Seasoning blends with salt. Salad dressings. Canned soups. Soy sauce. Ketchup. Barbecue sauce. Canned pasta sauce. Casseroles. Pizza. Lasagna. Frozen meals. Potato chips. [place] fries. Summary  You can reduce your risk of kidney stones by making changes to your diet.  The most important thing you can do is drink enough fluid. You should drink enough fluid  to keep your urine clear or pale yellow.  Ask your health care provider or dietitian how much protein from animal sources you should eat each day, and also how much salt and calcium you should have each day. This information is not intended to replace advice given to you by your health care provider. Make sure you discuss any questions you have with your health care provider. Document Released: 07/04/2010 Document Revised: 06/29/2018 Document Reviewed: 02/18/2016 Elsevier Patient Education  2020 [place] After Stroke A stroke causes damage to the brain cells, which can affect your ability to walk, talk, and even eat. The impact of a stroke is different for everyone, and so is recovery. A good nutrition plan is important for your recovery. It can also lower your risk of another stroke. If you have difficulty chewing and swallowing your food, a dietitian or your stroke care team can help so that you can enjoy eating healthy foods. What are tips for following this plan?  Reading food labels  Choose foods that have less than 300 milligrams (mg) of sodium per serving. Limit your sodium intake to less than 1,500 mg per day.  Avoid foods that have saturated fat and trans fat.  Choose foods that are low  in cholesterol. Limit the amount of cholesterol you eat each day to less than 200 mg.  Choose foods that are high in fiber. Eat 20-30 grams (g) of fiber each day.  Avoid foods with added sugar. Check the food label for ingredients such as sugar, corn syrup, honey, fructose, molasses, and cane juice. Shopping  At the grocery store, buy most of your food from areas near the walls of the store. This includes: ? Fresh fruits and vegetables. ? Dry grains, beans, nuts, and seeds. ? Fresh seafood, poultry, lean meats, and eggs. ? Low-fat dairy products.  Buy whole ingredients instead of prepackaged foods.  Buy fresh, in-season fruits and vegetables from local farmers markets.  Buy frozen fruits and vegetables in resealable bags. Cooking  Prepare foods with very little salt. Use herbs or salt-free spices instead.  Cook with heart-healthy oils, such as olive, avocado, canola, soybean, or sunflower oil.  Avoid frying foods. Bake, grill, or broil foods instead.  Remove visible fat and skin from meat and poultry before eating.  Modify food textures as told by your health care provider. Meal planning  Eat a wide variety of colorful fruits and vegetables. Make sure one-half of your plate is filled with fruits and vegetables at each meal.  Eat fruits and vegetables that are high in potassium, such as: ? Apples, bananas, oranges, and melon. ? Sweet potatoes, spinach, zucchini, and tomatoes.  Eat fish that contain heart-healthy fats (omega-3 fats) at least twice a week. These include salmon, tuna, mackerel, and sardines.  Eat plant foods that are high in omega-3 fats, such as flaxseeds and walnuts. Add these to cereals, yogurt, or pasta dishes.  Eat several servings of high-fiber foods each day, such as fruits, vegetables, whole grains, and beans.  Do not put salt at the table for meals.  When eating out at restaurants: ? Ask the server about low-salt or salt-free food options. ? Avoid  fried foods. Look for menu items that are grilled, steamed, broiled, or roasted. ? Ask if your food can be prepared without butter. ? Ask for condiments, such as salad dressings, gravy, or sauces to be served on the side.  If you have difficulty swallowing: ? Choose foods that  are softer and easier to chew and swallow. ? Cut foods into small pieces and chew well before swallowing. ? Thicken liquids as told by your health care provider or dietitian. ? Let your health care provider know if your condition does not improve over time. You may need to work with a speech therapist to re-train the muscles that are used for eating. General recommendations  Involve your family and friends in your recovery, if possible. It may be helpful to have a slower meal time and to plan meals that include foods everyone in the family can eat.  Brush your teeth with fluoride toothpaste twice a day, and floss once a day. Keeping a clean mouth can help you swallow and can also help your appetite.  Drink enough water each day to keep your urine pale yellow. If needed, set reminders or ask your family to help you remember to drink water.  Limit alcohol intake to no more than 1 drink a day for nonpregnant women and 2 drinks a day for men. One drink equals 12 oz of beer, 5 oz of wine, or 1 oz of hard liquor. Summary  Following this eating plan can help in your stroke recovery and can decrease your risk for another stroke.  Let your health care provider know if you have problems with swallowing. You may need to work with a speech therapist. This information is not intended to replace advice given to you by your health care provider. Make sure you discuss any questions you have with your health care provider. Document Released: 05/17/2017 Document Revised: 06/30/2018 Document Reviewed: 05/17/2017 Elsevier Patient Education  2020 [employer].   Physical Therapy After a Stroke After a stroke, some people experience  physical changes or problems. Physical therapy may be prescribed to help you recover and overcome problems such as:  Inability to move (paralysis) or weakness, typically affecting one side of the body.  Trouble with balance.  Pain, a pins and needles sensation, or numbness in certain parts of the body. You may also have difficulty feeling touch, pressure, or changes in temperature.  Involuntary muscle tightening (spasticity).  Stiffness in muscles and joints.  Altered coordination and reflexes. What causes physical disability after a stroke? A stroke can damage parts of your brain that control your body's normal functions, including your ability to move and to keep your balance. The types of physical problems you have will depend on how severe the stroke was and where it was located in the brain. Weakness or paralysis may affect just your fingers and hands, a whole leg or arm, or an entire side of your body. What is physical therapy? Physical therapy involves using exercises, stretches, and activities to help you regain movement and independence after your stroke. Physical therapy may focus on one or more of the following:  Range of motion. This can help with movement and reduce muscle stiffness.  Balance. This helps to lower your risk of falling.  Position changes or transfers, such as moving from sitting to standing or from a chair to a bed.  Coordination, such as getting an object from a shelf.  Muscle strength. Muscles may be strengthened with weights or by repeating certain motions.  Functional mobility. This may include stair training or learning how to use a wheelchair, walker, or cane.  Walking (gait training).  Activities of daily living, such as getting out of the car or buttoning a shirt. Why is physical therapy important? It is important to do exercises and  follow your rehabilitation plan as told by your physical therapist. Physical therapy can:  Help you regain  independence.  Prevent injury from falls by building strength and balance.  Lower your risk of blood clots.  Lower your risk of skin sores (pressure injuries).  Increase physical activity and exercise. This may help lower your risk for another stroke.  Help reduce pain. When will therapy start and where will I have therapy? Your health care provider will decide when it is best for you to start therapy. In some cases, people start rehabilitation, including physical therapy, as soon as they are medically stable, which may be 24-48 hours after a stroke. Rehabilitation can take place in a few different places, based on your needs. It may take place in:  The hospital or an in-patient rehabilitation hospital.  An outpatient rehabilitation facility.  A long-term care facility.  A community rehabilitation clinic.  Your home. What are assistive devices? Assistive devices are tools to help you move, maintain balance, and manage daily tasks while recovering from a stroke. Your physical therapist may recommend and help you learn to use:  Equipment to help you move, such as wheelchairs, canes, or walkers.  Braces or splints to keep your arms, hands, legs, or feet in a comfortable and safe position.  Bathtub benches or grab bars to keep you safe in the bathroom.  Special utensils, bowls, and plates that allow you to eat with one hand. It is important to use these devices as told by your health care provider. Summary  After a stroke, some people may experience physical disabilities, such as weakness or paralysis, pain, or balance problems.  Physical therapy involves exercises, stretches, and activities that help to improve your ability to move and to handle daily tasks.  Physical therapy exercises focus on restoring range of motion, balance, coordination, muscle strength, and the ability to move (mobility).  Physical therapy can help you regain independence, prevent falls, and allow you  to live a more active lifestyle after a stroke. This information is not intended to replace advice given to you by your health care provider. Make sure you discuss any questions you have with your health care provider. Document Released: 06/15/2016 Document Revised: 06/30/2018 Document Reviewed: 06/15/2016 Elsevier Patient Education  [place].   Sepsis, Diagnosis, Adult Sepsis is a serious bodily reaction to an infection. The infection that triggers sepsis may be from a bacteria, virus, or fungus. Sepsis can result from an infection in any part of your body. Infections that commonly lead to sepsis include skin, lung, and urinary tract infections. Sepsis is a medical emergency that must be treated right away in a hospital. In severe cases, it can lead to septic shock. Septic shock can weaken your heart and cause your blood pressure to drop. This can cause your central nervous system and your body's organs to stop working. What are the causes? This condition is caused by a severe reaction to infections from bacteria, viruses, or fungus. The germs that most often lead to sepsis include:  Escherichia coli (E. coli) bacteria.  Staphylococcus aureus (staph) bacteria.  Some types of Streptococcus bacteria. The most common infections affect these organs:  The lung (pneumonia).  The kidneys or bladder (urinary tract infection).  The skin (cellulitis).  The bowel, gallbladder, or pancreas. What increases the risk? You are more likely to develop this condition if:  Your body's disease-fighting system (immune system) is weakened.  You are age 57 or older.  You are female.  You had surgery or you have been hospitalized.  You have these devices inserted into your body: ? A small, thin tube (catheter). ? IV line. ? Breathing tube. ? Drainage tube.  You are not getting enough nutrients from food (malnourished).  You have a long-term (chronic) disease, such as cancer, lung  disease, kidney disease, or diabetes.  You are African American. What are the signs or symptoms? Symptoms of this condition may include:  Fever.  Chills or feeling very cold.  Confusion or anxiety.  Fatigue.  Muscle aches.  Shortness of breath.  Nausea and vomiting.  Urinating much less than usual.  Fast heart rate (tachycardia).  Rapid breathing (hyperventilation).  Changes in skin color. Your skin may look blotchy, pale, or blue.  Cool, clammy, or sweaty skin.  Skin rash. Other symptoms depend on the source of your infection. How is this diagnosed? This condition is diagnosed based on:  Your symptoms.  Your medical history.  A physical exam. Other tests may also be done to find out the cause of the infection and how severe the sepsis is. These tests may include:  Blood tests.  Urine tests.  Swabs from other areas of your body that may have an infection. These samples may be tested (cultured) to find out what type of bacteria is causing the infection.  Chest X-ray to check for pneumonia. Other imaging tests, such as a CT scan, may also be done.  Lumbar puncture. This removes a small amount of the fluid that surrounds your brain and spinal cord. The fluid is then examined for infection. How is this treated? This condition must be treated in a hospital. Based on the cause of your infection, you may be given an antibiotic, antiviral, or antifungal medicine. You may also receive:  Fluids through an IV.  Oxygen and breathing assistance.  Medicines to increase your blood pressure.  Kidney dialysis. This process cleans your blood if your kidneys have failed.  Surgery to remove infected tissue.  Blood transfusion if needed.  Medicine to prevent blood clots.  Nutrients to correct imbalances in basic body function (metabolism). You may: ? Receive important salts and minerals (electrolytes) through an IV. ? Have your blood sugar level adjusted. Follow  these instructions at home: Medicines   Take over-the-counter and prescription medicines only as told by your health care provider.  If you were prescribed an antibiotic, antiviral, or antifungal medicine, take it as told by your health care provider. Do not stop taking the medicine even if you start to feel better. General instructions  If you have a catheter or other indwelling device, ask to have it removed as soon as possible.  Keep all follow-up visits as told by your health care provider. This is important. Contact a health care provider if:  You do not feel like you are getting better or regaining strength.  You are having trouble coping with your recovery.  You frequently feel tired.  You feel worse or do not seem to get better after surgery.  You think you may have an infection after surgery. Get help right away if:  You have any symptoms of sepsis.  You have difficulty breathing.  You have a rapid or skipping heartbeat.  You become confused or disoriented.  You have a high fever.  Your skin becomes blotchy, pale, or blue.  You have an infection that is getting worse or not getting better. These symptoms may represent a serious problem that is an emergency. Do not wait  to see if the symptoms will go away. Get medical help right away. Call your local emergency services (911 in the [place] Do not drive yourself to the hospital. Summary  Sepsis is a medical emergency that requires immediate treatment in a hospital.  This condition is caused by a severe reaction to infections from bacteria, viruses, or fungus.  Based on the cause of your infection, you may be given an antibiotic, antiviral, or antifungal medicine.  Treatment may also include IV fluids, breathing assistance, and kidney dialysis. This information is not intended to replace advice given to you by your health care provider. Make sure you discuss any questions you have with your health care  provider. Document Released: 12/06/2002 Document Revised: 10/15/2017 Document Reviewed: 10/15/2017 Elsevier Patient Education  2020 [employer], Adult  Urosepsis is a type of sepsis. Sepsis is a severe bodily reaction to an infection. Urosepsis is caused by a bacterial infection that starts in the urinary tract and spreads to the blood. The urinary tract is the system where urine is made, stored, and passed out of the body and includes the kidneys, ureters, bladder, and urethra. This may also be called the urinary system. In severe cases, sepsis can lead to septic shock. Septic shock can weaken your heart and cause your blood pressure to drop. This can make the body's central nervous system and other vital organs stop working. Urosepsis is a medical emergency that requires immediate treatment in a hospital. What are the causes? Common causes of this condition include:  A urinary tract infection (UTI) that spreads to your blood.  A urinary tract blockage due to kidney stones.  Swelling and inflammation of the prostate (prostatitis) or prostate infection, in males. What increases the risk? You are more likely to develop this condition if you:  Are female, especially if you are sexually active.  Are age 44 or older.  Have a long-term disease, such as kidney disease or diabetes.  Have a weak disease-fighting system (immune system).  Have a condition that lessens or changes urine flow, such as a kidney or bladder stone, prostate disease, or a tumor in the urinary tract.  Have had surgery in an area of the urinary tract.  Have a small, thin tube in your urethra that drains urine from your bladder for a period of time (indwelling urinary catheter).  Have lost feeling below the waist or are in a wheelchair. What are the signs or symptoms? Early symptoms of this condition are similar to symptoms of a severe UTI. Common symptoms of this condition include:  Pain in your  side, back, or lower abdomen.  Fever and chills.  Nausea and vomiting.  Frequent need to pass urine.  Burning pain when passing urine.  Bloody or cloudy urine.  Bad-smelling urine.  Fatigue.  Trouble passing urine or not being able to pass urine at all. Once the infection has spread to the blood and a sepsis reaction starts, other symptoms may include:  Chills with shaking.  Cold and clammy skin.  A fever of 101.757F (38.5C) or higher.  Low body temperature of 96.57F (36C) or lower.  Fast breathing.  Trouble breathing.  Fast heartbeat.  Severe pain in the abdomen.  Muscle aches.  Anxiety.  Problems staying awake.  Confusion.  Fainting. How is this diagnosed? This condition is diagnosed based on your symptoms, your medical history, and a physical exam. You may also have:  Urine tests or blood tests to check kidney function and  to look for infection.  Imaging tests such as a CT scan or ultrasound to check for blockages in the urinary system. How is this treated? This condition is a medical emergency that needs to be treated right away in the hospital. This condition may be treated with:  An IV so that you can quickly receive: ? Antibiotic medicines. ? Fluids. ? Medicines to support blood pressure.  Oxygen and breathing support, if needed.  Removing a urinary catheter if it is the source of the infection, if this applies.  Filtering your blood with a machine (dialysis). This process cleans your blood if your kidneys have failed.  Surgery to drain infected areas or restore urine flow. This is rare. Follow these instructions at home: Medicines  Take over-the-counter and prescription medicines only as told by your health care provider.  If you were prescribed an antibiotic medicine, take it as told by your health care provider. Do not stop using the antibiotic even if you start to feel better. General instructions   Drink enough fluid to keep your  urine pale yellow.  Return to your normal activities as told by your health care provider. Ask your health care provider what activities are safe for you.  Keep all follow-up visits as told by your health care provider. This is important. Contact a health care provider if:  You have symptoms that get worse or do not get better with treatment.  You have new UTI symptoms. Get help right away if:  You have new or continued symptoms of sepsis after hospitalization, such as: ? A fever of 101.51F (38.5C) or higher. ? Low body temperature of 96.78F (36C) or lower. ? Chills. ? Severe pain. ? Difficulty breathing. ? Confusion. ? Sleepiness. ? Nausea and vomiting. These symptoms may represent a serious problem that is an emergency. Do not wait to see if the symptoms will go away. Get medical help right away. Call your local emergency services (911 in the [place] Do not drive yourself to the hospital. Summary  Urosepsis is a type of sepsis. Sepsis is a severe bodily reaction to an infection.  Urosepsis is a medical emergency that requires immediate treatment in a hospital.  Possible causes of urosepsis include a urinary tract infection that spreads to your blood, blockage from kidney stones, and prostate swelling or infection in males.  This condition may be treated with an IV so that you can quickly receive antibiotic medicines, fluids, and medicines to support your blood pressure. Other treatments may be used as well.  Get help right away if you have new or continued symptoms of sepsis after hospitalization. This information is not intended to replace advice given to you by your health care provider. Make sure you discuss any questions you have with your health care provider. Document Released: 03/09/2005 Document Revised: 04/06/2018 Document Reviewed: 04/07/2018 Elsevier Patient Education  [place]. Heart monitor implant site care Keep incision clean and dry for 3 days. You  can remove outer dressing tomorrow. Leave steri-strips (little pieces of tape) on until seen in the office for wound check appointment. Call the office [phone]) for redness, drainage, swelling, or fever.

## 2018-12-25 NOTE — Discharge Summary (Signed)
Discharge Summary  Pamela Carney:[id number] DOB: 1965-12-08  PCP: Patient, No Pcp Per  Admit date: 12/14/2018 Discharge date: 12/25/2018  Time spent: 35 minutes  Recommendations for Outpatient Follow-up:  1. Follow-up with neurology 2. Follow-up with cardiology 3. Follow-up with urology 4. Follow-up with ophthalmology 5. Follow-up with vascular surgery 6. Follow-up with your PCP 7. Take your medications as prescribed 8. Abstain from driving until cleared by ophthalmology   Discharge Diagnoses:  Active Hospital Problems   Diagnosis Date Noted   Cerebral embolism with cerebral infarction 12/22/2018   Septic shock ([facility]) 12/15/2018    Resolved Hospital Problems  No resolved problems to display.    Discharge Condition: Stable  Diet recommendation: Heart healthy carb modified diet  Vitals:   12/24/18 2049 12/25/18 0530  BP: 98/63 109/60  Pulse: 61 (!) 52  Resp: 18 18  Temp: 97.8 F (36.6 C) 97.7 F (36.5 C)  SpO2: 94% 99%    History of present illness:  53 yr old F w/ PMHx sig fornephrolithiasis and Bipolar disorder[facility]/ abdominal pain and emesis found to have a 11 mm obtsructng stone.Septic shock. Post Cystoscopy, right retrograde ureteropyelogram, fluoroscopic interpretation, right double-J stent placement. 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 Per EDP documentation the patient stated that these symptoms began three days prior to presentation and have become progressively worse. She also reportedsubjective fevers, and a non productive cough.   PostCystoscopy, right retrograde ureteropyelogram, fluoroscopic interpretation, right double-J stent placement on  12/15/18 by Dr. Diona Fanti.  Off of Levophed gtt and remains hemodynamically stable. Transferred to [facility] service on 12/17/2018.   Vaginal candidiasis treated on 12/18/18 with 1 dose p.o. fluconazole 200 mg once.   Ureteral spasm and discomfort of lower abdomen reported on 12/19/2018 treated with Pyridium.   Hospital course complicated by persistent right eye discomfort (present about 1 week prior to admission to the hospital) which prompted ophthalmology consult.  Recommendation for MRI brain.  Completed on 12/21/2018, showed acute/subacute ischemic CVA involving left medial occipital lobe.  Seen by stroke team.  Discussed with Dr. Talbert Forest, will follow-up with ophthalmology outpatient.  12/23/18: Post TEE, no valvular vegetations.  Post implantable loop recorder at upper left chest.  Completed Sleep Smart study on 12/24/18 with newly diagnosed OSA, now on CPAP through study.  12/25/18: Patient was seen and examined at her bedside.  No acute events overnight.  States she slept well and better with the CPAP.  No new complaints.  Vital signs and labs reviewed and are stable.    On the day of discharge, the patient was hemodynamically stable.  She will need to follow-up with her primary care provider, neurology, cardiology, vascular surgery urology, and ophthalmology posthospitalization.     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 Stroke work-up: LDL 99 with goal less than 70 A1c 8.6 with goal less than 7.0 2D echo done on 12/15/2018 showed normal LVEF with no evidence of PFO Twelve-lead EKG done on 12/21/2018 showed  normal sinus rhythm Bilateral carotid Doppler  ultrasound showed right ICA 60 to 79% stenosis, left ICA 40 to 59% stenosis Vascular surgery Dr. Donzetta Matters, seen with plan for aggressive medical management and follow-up outpatient in 6 months with carotid duplex in the office. Started on aspirin 81 mg daily and Plavix 75 mg daily as recommended by neurology Continue Lipitor 80 mg daily TEE completed on 12/23/2018  no evidence for endocarditis, ruled out. Implantable Loop recorder placed on 12/23/18 for cryptogenic CVA to rule out paroxysmal A. Fib. Follow-up with neurology, ophthalmology, cardiology, vascular surgery, and with PCP Abstain from driving until cleared by ophthalmology  Newly diagnosed OSA, now on CPAP at night Completed participation in sleep SMART study Continue CPAP at night  Bilateral carotid artery stenosis right greater than left Doppler ultrasound revealed right ICA 60 to 79% stenosis, left ICA 40 to 59% stenosis Continue aspirin 81 mg daily Plavix 75 mg daily and Lipitor 80 mg daily Will follow-up with vascular surgery outpatient in 6 months  Polyneuropathy likely secondary to uncontrolled diabetes with hyperglycemia Continue gabapentin 300 mg twice daily Follow-up with PCP  CKD 3 Baseline creatinine appears to be 1.1 with GFR 52 Creatinine back to baseline 1.1 with GFR of 52 on 12/25/2018. Continue to avoid nephrotoxins like NSAIDs Follow-up to PCP  Resolving sepsis with recent septic shock secondary to complicated Enterococcus faecalis/E. coli UTI in the setting of right distal ureteral calculus with pyelonephrosis and E. coli bacteremia Off pressors Urine culture taken on 12/15/2018 grew 20,000 colonies of Enterococcus faecalis, 2000 colonies of E. coli Resistance to ampicillin and ciprofloxacin intermediate resistance to ampicillin/sulbactam Completed 5 days of IV Rocephin Switched to cefdinir 300 mg BID on 12/20/18 x5 days Completed 10-day course of antibiotics  Right homonymous hemianopsia /right eye  discomfort secondary to acute/subacute ischemic CVA involving left medial occipital lobe States she has been seeing rings with her right eye and is causing dizziness Seen by ophthalmology Dr. Talbert Forest who recommended brain MRI. Informed Dr. Talbert Forest of new findings; follow-up with ophthalmology Dr. Talbert Forest outpatient.  E. coli bacteremia, poa Management as stated above Repeated blood cultures x2 done on 12/17/2018- to date Completed total of 10 days of antibiotics TEE completed, no evidence for endocarditis, ruled out.  UTI with LUTS, POA Management as stated above  Ureteral/bladder spasm Completed Pyridium 100 mg 3 times daily x3 days KUB done on 12/20/18 showed proper placement of stent Continue oxybutynin as needed and follow up at urology clinic  Vaginal candidiasis Completed 1 dose of fluconazole 200 mg once  Resolving prerenal AKI with IV fluid hydration Appears to be at her baseline creatinine 1.1 with GFR of 52 Presented with creatinine of 2.45 and GFR 22 Continue to avoid nephrotoxins such as NSAIDs Good urine output Follow up with your PCP  E. coli bacteremia likely from urinary source Blood cultures drawn on 12/15/2018 1 out of 2 bottles grew E. coli Resistant to ampicillin, ampicillin/sulbactam, ciprofloxacin Completed 5 days of Rocephin Repeat blood culture x2 peripherally on 12/17/18 no growth to date Remove central line right IJ on 12/19/2018   Right distal ureteral calculus with pyonephrosis status post right cystoscopy and stent with urology on 12/15/2018 Continue to optimize pain control Reports persistent spasm; will contact urology.  Type 2 diabetes with hyperglycemia Blood sugar is better controlled Hemoglobin A1c 8.6 on 12/15/2018 Continue insulin coverage Continue to hold off oral anti-glycemic's  Physical debility: Improving PT OT had no further recommendations Continue to mobilize Fall precautions  History of  type I bipolar disorder Currently  not on any medications. Follow up with your PCP  Moderate aorto bi-iliac atherosclerotic disease Seen on CT study Lipid panel: LDL 99 Chol 183 TG 288 HDL 26 Given that pt is >40 yrs of age has a h/o DM Started Atorvastatin   Severe morbid obesity BMI 40 Recommend weight loss outpatient with regular physical activity and healthy dieting   Code Status: Full code   Antimicrobials:  Rocephin  Cefdinir   Consultations:  PCCM  Neurology  Urology  Cardiology  Vascular surgery  Ophthalmology.  Discharge Exam: BP 109/60 (BP Location: Left Arm)    Pulse (!) 52    Temp 97.7 F (36.5 C) (Oral)    Resp 18    Ht 5' 2"  (1.575 m)    Wt 96.9 kg    SpO2 99%    BMI 39.09 kg/m   General: 53 y.o. year-old female well developed well nourished in no acute distress.  Alert and oriented x4.  Cardiovascular: Regular rate and rhythm with no rubs or gallops.  No thyromegaly or JVD noted.    Respiratory: Clear to auscultation with no wheezes or rales. Good inspiratory effort.  Abdomen: Soft nontender nondistended with normal bowel sounds x4 quadrants.  Musculoskeletal: No lower extremity edema. 2/4 pulses in all 4 extremities.  Psychiatry: Mood is appropriate for condition and setting  Discharge Instructions You were cared for by a hospitalist during your hospital stay. If you have any questions about your discharge medications or the care you received while you were in the hospital after you are discharged, you can call the unit and asked to speak with the hospitalist on call if the hospitalist that took care of you is not available. Once you are discharged, your primary care physician will handle any further medical issues. Please note that NO REFILLS for any discharge medications will be authorized once you are discharged, as it is imperative that you return to your primary care physician (or establish a relationship with a primary care physician if you do not have one) for your  aftercare needs so that they can reassess your need for medications and monitor your lab values.  Discharge Instructions    Ambulatory referral to Neurology   Complete by: As directed    Follow up with stroke clinic NP (Jessica Vanschaick or Cecille Rubin, if both not available, consider Zachery Dauer, or [facility]) at [facility] in about 4 weeks. Thanks.   Ambulatory referral to Physical Therapy   Complete by: As directed    Evaluation and treatment post stroke-neuro rehab     Allergies as of 12/25/2018      Reactions   Sulfa Antibiotics Hives, Swelling   Swelling site not recalled   Latex Rash   Tape Rash   Not tolerated well      Medication List    STOP taking these medications   insulin degludec 100 UNIT/ML Sopn FlexTouch Pen Commonly known as: TRESIBA   levonorgestrel 20 MCG/24HR IUD Commonly known as: MIRENA     TAKE these medications   aspirin 81 MG EC tablet Take 1 tablet (81 mg total) by mouth daily. Start taking on: December 26, 2018   atorvastatin 80 MG tablet Commonly known as: LIPITOR Take 1 tablet (80 mg total) by mouth daily at 6 PM.   clopidogrel 75 MG tablet Commonly known as: PLAVIX Take 1 tablet (75 mg total) by mouth daily. Start taking on: December 26, 2018   gabapentin 300 MG capsule Commonly  known as: NEURONTIN Take 1 capsule (300 mg total) by mouth 2 (two) times daily.   glucose blood test strip Commonly known as: OneTouch Verio Use to check blood sugars every morning fasting and 2 hours after largest meal   Insulin Glargine 100 UNIT/ML Solostar Pen Commonly known as: LANTUS Inject 15 Units into the skin at bedtime.   metFORMIN 500 MG tablet Commonly known as: GLUCOPHAGE Take 1 tablet (500 mg total) by mouth 2 (two) times daily with a meal. PATIENT MUST HAVE OFFICE VISIT PRIOR TO ANY FURTHER REFILLS!   ONE TOUCH LANCETS Misc Use to check blood sugars every morning fasting and 2 hours after largest meal   One-A-Day Womens 50+ Advantage  Tabs Take 1 tablet by mouth daily.   OneTouch Verio w/Device Kit Use to check blood sugars every morning fasting and 2 hours after largest meal   oxybutynin 5 MG tablet Commonly known as: DITROPAN Take 0.5 tablets (2.5 mg total) by mouth 2 (two) times daily as needed for up to 10 days for bladder spasms.   oxyCODONE 5 MG immediate release tablet Commonly known as: Oxy IR/ROXICODONE Take 1 tablet (5 mg total) by mouth daily as needed for severe pain or breakthrough pain.   Pen Needles 31G X 8 MM Misc 1 each by Does not apply route daily.   senna-docusate 8.6-50 MG tablet Commonly known as: Senokot-S Take 2 tablets by mouth at bedtime as needed for mild constipation.            Durable Medical Equipment  (From admission, onward)         Start     Ordered   12/22/18 0956  For home use only DME Shower stool  Once     12/22/18 0955         Allergies  Allergen Reactions   Sulfa Antibiotics Hives and Swelling    Swelling site not recalled   Latex Rash   Tape Rash    Not tolerated well   Follow-up Information    [place]. Go on 01/10/2019.   Why: @ 9:30am Contact information: [place] [phone] [place] [facility].   Specialty: Rehabilitation Why: The office will call you within 2-3 business days to schedule an appointment. If the office has not called you- please call them.  Contact information: [address] [place] [phone] [place] [address] [phone]       [facility] Follow up.   Specialty: Cardiology Why: 01/05/2019 @ 10:00AM, wound check visit (heart monitor) Contact information: [address] [place] [phone]       [facility]. Schedule an appointment as soon as possible for a visit in 4 week(s).    Specialty: Neurology Contact information: [address] [place] [phone]       Alexis Frock, MD. Call in 1 day(s).   Specialty: Urology Why: Please call for a post hospital follow-up appointment. Contact information: [place] [address] [phone]        Darleen Crocker, MD. Call in 1 day(s).   Specialty: Ophthalmology Why: Please call for a post hospital follow-up appointment. Contact information: [place] [address] [place] [address] [phone]        Waynetta Sandy, MD. Call in 1 day(s).   Specialties: Vascular  Surgery, Cardiology Why: Please call for a post hospital follow-up appointment. Contact information: [address] [place] [address] [phone]            The results of significant diagnostics from this hospitalization (including imaging, microbiology, ancillary and laboratory) are listed below for reference.    Significant Diagnostic Studies: Ct Angio Head W Or Wo Contrast  Result Date: 12/21/2018 CLINICAL DATA:  53 year old female with vertigo, right side hemianopia and headache. Medial left occipital lobe infarct on brain MRI earlier today. EXAM: CT ANGIOGRAPHY HEAD AND NECK TECHNIQUE: Multidetector CT imaging of the head and neck was performed using the standard protocol during bolus administration of intravenous contrast. Multiplanar CT image reconstructions and MIPs were obtained to evaluate the vascular anatomy. Carotid stenosis measurements (when applicable) are obtained utilizing NASCET criteria, using the distal internal carotid diameter as the denominator. CONTRAST:  75m OMNIPAQUE IOHEXOL 350 MG/ML SOLN COMPARISON:  Brain MRI earlier today. FINDINGS: CTA NECK Skeleton: Poor mandibular dentition. Absent maxillary dentition. No acute osseous abnormality identified. Upper chest: Mosaic attenuation in the upper lungs probably due to mild gas trapping. No  superior mediastinal lymphadenopathy. Other neck: Small calcified left thyroid nodule, does not meet size criteria for follow-up. No acute finding in the neck. Aortic arch: Calcified aortic atherosclerosis. Three vessel arch configuration. Right carotid system: No brachiocephalic artery or right CCA origin stenosis despite plaque. There appears to be bulky soft plaque along the proximal right CCA at the level of the thyroid (series 7, image 131) without stenosis. Calcified plaque then in the more distal right CCA and at the right carotid bifurcation. No significant stenosis at the bifurcation, but soft and calcified plaque continues through the bulb, and there is high-grade stenosis numerically estimated at 70-80% just distal to the bulb on series 7, image 95 and series 11, image 68. The right ICA remains patent to the skull base. Left carotid system: No left CCA origin stenosis despite abundant plaque. Medial soft plaque in the vessel at the larynx without stenosis. Calcified plaque approaching the left carotid bifurcation. No left ICA origin stenosis, but continued plaque in the vessel resulting in bulb stenosis estimated at 60 % with respect to the distal vessel (series 7, image 100). The vessel remains patent to the skull base. Vertebral arteries: Proximal right subclavian plaque without significant stenosis. Right vertebral artery origin plaque with mild stenosis. Patent right vertebral artery to the skull base without additional stenosis. Bulky calcified plaque at the left subclavian artery origin with high-grade stenosis on series 9, image 304. The left subclavian remains patent. Calcified plaque at the left vertebral artery origin resulting in moderate stenosis on series 9, image 275. The left vertebral artery is diminutive and irregular with intermittent V2 calcified plaque but remains patent to the skull base. CTA HEAD Posterior circulation: Diminutive and irregular bilateral distal vertebral arteries.  Severe stenosis of the left V4 segment. The left vertebral is occluded beyond the left PICA origin which remains patent. The right V4 segment is diminutive but patent without stenosis and supplies the basilar. The basilar artery is diminutive with mild stenosis proximal to the tip. The bilateral AICA and SCA origins are patent, but there are fetal type bilateral PCA origins. The right PCA is patent but diminutive and irregular. See series 14, image 19. The left PCA is occluded at the bifurcation (P3 segment series 14, image 25). Some of the inferior left PCA division remains patent. Anterior circulation: Both ICA siphons are patent but heavily calcified. On  the left there is up to moderate cavernous and supraclinoid segment stenosis. Normal left posterior communicating artery origin. On the right there is mild siphon stenosis. Patent carotid termini. Patent MCA and ACA origins. The left A1 is dominant. The left ACA A1 segment is dominant but the right A2 segment is dominant. Anterior communicating artery is within normal limits. No definite ACA branch stenosis. Left MCA M1 and bifurcation are patent without stenosis. Left MCA branches are within normal limits. Right MCA M1 and bifurcation are patent without stenosis. Right MCA branches are within normal limits. Venous sinuses: Early contrast timing, not evaluated. Anatomic variants: Fetal type bilateral PCA origins. Dominant left A1 and right A2. Review of the MIP images confirms the above findings IMPRESSION: 1. 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 2. Positive also for widespread atherosclerosis and stenosis in the head and neck: - Severe Left Subclavian Artery origin stenosis. - Severe Right ICA bulb stenosis (estimated at 70-80%). - diminutive Left Vertebral Artery which occludes beyond the left PICA origin, and is moderately stenotic at its origin. - Left ICA bulb 60% stenosis. - heavily  calcified ICA siphons with moderate stenosis on the Left. - diminutive Basilar Artery with mild stenosis. 3.  Aortic Atherosclerosis (ICD10-I70.0). 4. Carious dentition. Electronically Signed   By: Genevie Ann M.D.   On: 12/21/2018 22:55   X-ray Chest Pa Or Ap  Result Date: 12/15/2018 CLINICAL DATA:  Central line placement EXAM: CHEST  1 VIEW COMPARISON:  Yesterday FINDINGS: Cardiomegaly. Low volume chest with interstitial coarsening. No Kerley lines, effusion, or pneumothorax. Right IJ line with tip at the SVC, course distorted by leftward rotation. IMPRESSION: Right IJ line with tip at the SVC.  No pneumothorax. Cardiomegaly and vascular congestion. Electronically Signed   By: Monte Fantasia M.D.   On: 12/15/2018 06:16   Dg Abd 1 View  Result Date: 12/20/2018 CLINICAL DATA:  Right flank pain. Recent ureteral stent placement. Urolithiasis. EXAM: ABDOMEN - 1 VIEW COMPARISON:  None. FINDINGS: Double pigtail right ureteral stent is seen in expected position. No definite radiopaque calculi are seen along the course of the stent. Several radiodensities are seen in the expected region of left kidney, consistent with small left renal calculi. IUD noted in the pelvis. Bowel gas pattern is normal. IMPRESSION: Right ureteral stent in appropriate position. No definite radiopaque ureteral calculi identified. Left nephrolithiasis. Electronically Signed   By: Marlaine Hind M.D.   On: 12/20/2018 15:35   Dg Abd 1 View  Result Date: 12/15/2018 CLINICAL DATA:  Fluoroscopic guidance during cystoscopy. Ureteral stone. EXAM: ABDOMEN - 1 VIEW; DG C-ARM 1-60 MIN COMPARISON:  CT scan of same day. FLUOROSCOPY TIME:  28 seconds. FINDINGS: Single intraoperative fluoroscopic image demonstrates catheter in right ureter with injection of contrast into right intrarenal collecting system. No significant hydronephrosis is noted. IMPRESSION: Fluoroscopic guidance provided during cystoscopy. Electronically Signed   By: Marijo Conception M.D.    On: 12/15/2018 07:43   Ct Angio Neck W Or Wo Contrast  Result Date: 12/21/2018 CLINICAL DATA:  53 year old female with vertigo, right side hemianopia and headache. Medial left occipital lobe infarct on brain MRI earlier today. EXAM: CT ANGIOGRAPHY HEAD AND NECK TECHNIQUE: Multidetector CT imaging of the head and neck was performed using the standard protocol during bolus administration of intravenous contrast. Multiplanar CT image reconstructions and MIPs were obtained to evaluate the vascular anatomy. Carotid stenosis measurements (when applicable) are obtained utilizing  NASCET criteria, using the distal internal carotid diameter as the denominator. CONTRAST:  28m OMNIPAQUE IOHEXOL 350 MG/ML SOLN COMPARISON:  Brain MRI earlier today. FINDINGS: CTA NECK Skeleton: Poor mandibular dentition. Absent maxillary dentition. No acute osseous abnormality identified. Upper chest: Mosaic attenuation in the upper lungs probably due to mild gas trapping. No superior mediastinal lymphadenopathy. Other neck: Small calcified left thyroid nodule, does not meet size criteria for follow-up. No acute finding in the neck. Aortic arch: Calcified aortic atherosclerosis. Three vessel arch configuration. Right carotid system: No brachiocephalic artery or right CCA origin stenosis despite plaque. There appears to be bulky soft plaque along the proximal right CCA at the level of the thyroid (series 7, image 131) without stenosis. Calcified plaque then in the more distal right CCA and at the right carotid bifurcation. No significant stenosis at the bifurcation, but soft and calcified plaque continues through the bulb, and there is high-grade stenosis numerically estimated at 70-80% just distal to the bulb on series 7, image 95 and series 11, image 68. The right ICA remains patent to the skull base. Left carotid system: No left CCA origin stenosis despite abundant plaque. Medial soft plaque in the vessel at the larynx without stenosis.  Calcified plaque approaching the left carotid bifurcation. No left ICA origin stenosis, but continued plaque in the vessel resulting in bulb stenosis estimated at 60 % with respect to the distal vessel (series 7, image 100). The vessel remains patent to the skull base. Vertebral arteries: Proximal right subclavian plaque without significant stenosis. Right vertebral artery origin plaque with mild stenosis. Patent right vertebral artery to the skull base without additional stenosis. Bulky calcified plaque at the left subclavian artery origin with high-grade stenosis on series 9, image 304. The left subclavian remains patent. Calcified plaque at the left vertebral artery origin resulting in moderate stenosis on series 9, image 275. The left vertebral artery is diminutive and irregular with intermittent V2 calcified plaque but remains patent to the skull base. CTA HEAD Posterior circulation: Diminutive and irregular bilateral distal vertebral arteries. Severe stenosis of the left V4 segment. The left vertebral is occluded beyond the left PICA origin which remains patent. The right V4 segment is diminutive but patent without stenosis and supplies the basilar. The basilar artery is diminutive with mild stenosis proximal to the tip. The bilateral AICA and SCA origins are patent, but there are fetal type bilateral PCA origins. The right PCA is patent but diminutive and irregular. See series 14, image 19. The left PCA is occluded at the bifurcation (P3 segment series 14, image 25). Some of the inferior left PCA division remains patent. Anterior circulation: Both ICA siphons are patent but heavily calcified. On the left there is up to moderate cavernous and supraclinoid segment stenosis. Normal left posterior communicating artery origin. On the right there is mild siphon stenosis. Patent carotid termini. Patent MCA and ACA origins. The left A1 is dominant. The left ACA A1 segment is dominant but the right A2 segment is  dominant. Anterior communicating artery is within normal limits. No definite ACA branch stenosis. Left MCA M1 and bifurcation are patent without stenosis. Left MCA branches are within normal limits. Right MCA M1 and bifurcation are patent without stenosis. Right MCA branches are within normal limits. Venous sinuses: Early contrast timing, not evaluated. Anatomic variants: Fetal type bilateral PCA origins. Dominant left A1 and right A2. Review of the MIP images confirms the above findings IMPRESSION: 1. Positive for occlusion of the distal Left PCA (P3),  corresponding to the acute ischemia seen by MRI today. 2. Positive also for widespread atherosclerosis and stenosis in the head and neck: - Severe Left Subclavian Artery origin stenosis. - Severe Right ICA bulb stenosis (estimated at 70-80%). - diminutive Left Vertebral Artery which occludes beyond the left PICA origin, and is moderately stenotic at its origin. - Left ICA bulb 60% stenosis. - heavily calcified ICA siphons with moderate stenosis on the Left. - diminutive Basilar Artery with mild stenosis. 3.  Aortic Atherosclerosis (ICD10-I70.0). 4. Carious dentition. Electronically Signed   By: Genevie Ann M.D.   On: 12/21/2018 22:55   Mr Brain Wo Contrast  Result Date: 12/21/2018 CLINICAL DATA:  Vertigo.  Right-sided hemianopia.  Headache. EXAM: MRI HEAD WITHOUT CONTRAST TECHNIQUE: Multiplanar, multiecho pulse sequences of the brain and surrounding structures were obtained without intravenous contrast. COMPARISON:  CT head 08/19/2007 FINDINGS: Brain: Acute/subacute infarct in the left medial occipital lobe. No associated hemorrhage. No other acute infarct. Scattered white matter hyperintensities bilaterally compatible chronic ischemia, moderate in degree. Patchy hyperintensity in the pons bilaterally. Small chronic infarct head of caudate on the right. Chronic infarct left internal capsule and left external capsule. Ventricle size normal.  Negative for hemorrhage or  mass. Vascular: Normal arterial flow voids. Skull and upper cervical spine: Negative Sinuses/Orbits: Negative Other: None IMPRESSION: Acute/subacute infarct left medial occipital lobe without hemorrhage Moderate chronic microvascular ischemic change. Electronically Signed   By: Franchot Gallo M.D.   On: 12/21/2018 12:25   Dg Chest Port 1 View  Result Date: 12/14/2018 CLINICAL DATA:  Vomiting EXAM: PORTABLE CHEST 1 VIEW COMPARISON:  12/23/2017 FINDINGS: Heart is borderline in size. Lungs clear. No effusions or edema. Aortic atherosclerosis. No acute bony abnormality. IMPRESSION: Borderline heart size.  No active disease. Electronically Signed   By: Rolm Baptise M.D.   On: 12/14/2018 23:54   Dg C-arm 1-60 Min  Result Date: 12/15/2018 CLINICAL DATA:  Fluoroscopic guidance during cystoscopy. Ureteral stone. EXAM: ABDOMEN - 1 VIEW; DG C-ARM 1-60 MIN COMPARISON:  CT scan of same day. FLUOROSCOPY TIME:  28 seconds. FINDINGS: Single intraoperative fluoroscopic image demonstrates catheter in right ureter with injection of contrast into right intrarenal collecting system. No significant hydronephrosis is noted. IMPRESSION: Fluoroscopic guidance provided during cystoscopy. Electronically Signed   By: Marijo Conception M.D.   On: 12/15/2018 07:43   Ct Renal Stone Study  Result Date: 12/15/2018 CLINICAL DATA:  Initial evaluation for acute flank pain, recurrent stone disease suspected. EXAM: CT ABDOMEN AND PELVIS WITHOUT CONTRAST TECHNIQUE: Multidetector CT imaging of the abdomen and pelvis was performed following the standard protocol without IV contrast. COMPARISON:  Prior CT from 12/23/2017. FINDINGS: Lower chest: Mild scattered subsegmental atelectatic changes present within the lung bases. Visualized lungs are otherwise clear. Hepatobiliary: Limited noncontrast evaluation liver is unremarkable. Gallbladder within normal limits. No biliary dilatation. Pancreas: Few punctate calcifications seen at the pancreatic  head, suggesting sequelae of chronic pancreatitis. Pancreas otherwise unremarkable without acute inflammatory changes. Spleen: Spleen within normal limits. Adrenals/Urinary Tract: Left adrenal gland normal. 3.1 cm right adrenal adenoma, similar to previous. Right kidney asymmetrically enlarged as compared to the left. 10 mm calcific density overlying the region of the distal right ureter felt to be most consistent with an obstructive stone. Secondary moderate right hydroureteronephrosis with prominent perinephric and periureteral fat stranding. Additional nonobstructive calculi present within the right kidney, largest of which measures 5-6 mm at the lower pole. 3 mm nonobstructive calculus present within the interpolar left kidney. No obstructive calculi  seen along the course of the left renal collecting system with no left-sided hydronephrosis or hydroureter. Focal cortical scarring with parenchymal calcification noted at the left kidney. Layering calcification noted within the calyceal diverticulum at the lower pole. Partially distended bladder within normal limits. No layering stones within the bladder lumen. Stomach/Bowel: Stomach within normal limits. No evidence for bowel obstruction. Negative appendix. Colonic diverticulosis without evidence for acute diverticulitis. No acute inflammatory changes seen about the bowels. Vascular/Lymphatic: Moderate aorto bi-iliac atherosclerotic disease. No aneurysm. Few scattered periaortic and aortocaval nodes noted within the upper abdomen, measuring up to 13 mm (series 3, image 34, 35, 39), mildly increased from previous. No other adenopathy. Reproductive: IUD in place within the uterus. Uterus and ovaries otherwise unremarkable. Other: Small fat containing paraumbilical hernia without associated inflammation. No free air or fluid. Musculoskeletal: No acute osseous finding. No discrete lytic or blastic osseous lesions. Multilevel facet arthropathy noted within the lower  lumbar spine. IMPRESSION: 1. 10 mm obstructive stone within the distal right ureter with secondary moderate left hydroureteronephrosis. 2. Additional bilateral nonobstructive nephrolithiasis as above. 3. Colonic diverticulosis without evidence for acute diverticulitis. 4. Mildly enlarged periaortic and aortocaval adenopathy within the upper abdomen, mildly increased from previous. Finding is indeterminate, but could be reactive in nature. Attention at follow-up. 5. Moderate aorto bi-iliac atherosclerotic disease. Electronically Signed   By: Jeannine Boga M.D.   On: 12/15/2018 02:46   Vas US Carotid  Result Date: 12/22/2018 Carotid Arterial Duplex Study Indications: CVA. Limitations  Today's exam was limited due to respiratory interference - snoring. Performing Technologist: Toma Copier RVS  Examination Guidelines: A complete evaluation includes B-mode imaging, spectral Doppler, color Doppler, and power Doppler as needed of all accessible portions of each vessel. Bilateral testing is considered an integral part of a complete examination. Limited examinations for reoccurring indications may be performed as noted.  Right Carotid Findings: +----------+--------+--------+--------+------------------+---------------------+             PSV cm/s EDV cm/s Stenosis Plaque Description Comments               +----------+--------+--------+--------+------------------+---------------------+  CCA Prox   106      14                heterogenous       intiomal thickening                                                              with mild plaque       +----------+--------+--------+--------+------------------+---------------------+  CCA Distal 81       19                heterogenous       intimal thickening                                                               with mild plaque       +----------+--------+--------+--------+------------------+---------------------+  ICA Prox   112      40       1-39%     heterogenous  mild to moderate                                                                 plaque                 +----------+--------+--------+--------+------------------+---------------------+  ICA Mid    224      29       60-79%   heterogenous       There is a narrowing                                                             at the proximal to                                                               mid region velocities                                                            are psv 250, edv 76    +----------+--------+--------+--------+------------------+---------------------+  ICA Distal 207      43       40-59%   heterogenous       mild plaque            +----------+--------+--------+--------+------------------+---------------------+  ECA        88       8                 heterogenous       mild plaque at the                                                               origin                 +----------+--------+--------+--------+------------------+---------------------+ +----------+--------+-------+--------+-------------------+             PSV cm/s EDV cms Describe Arm Pressure (mmHG)  +----------+--------+-------+--------+-------------------+  Subclavian 27       1                                     +----------+--------+-------+--------+-------------------+ +---------+--------+--+--------+--+  Vertebral PSV cm/s 36 EDV cm/s 11  +---------+--------+--+--------+--+ There is a 60% to 79% ICA stenosis at the proximal to mid egion Left Carotid Findings: +----------+--------+--------+--------+--------------------+-------------------+             PSV cm/s EDV cm/s Stenosis Plaque Description   Comments             +----------+--------+--------+--------+--------------------+-------------------+  CCA Prox   125      17                heterogenous         intimal wall                                                                     changes with mild                                                                 plaque               +----------+--------+--------+--------+--------------------+-------------------+  CCA Distal 97       22                heterogenous         intimal wall                                                                     changes with mild                                                                plaque               +----------+--------+--------+--------+--------------------+-------------------+  ICA Prox   138      35       1-39%    heterogenous and     mild to moderate                                            irregular            plaque               +----------+--------+--------+--------+--------------------+-------------------+  ICA Mid    174      30       1-39%    heterogenous         mild plaque          +----------+--------+--------+--------+--------------------+-------------------+  ICA Distal 189      53       40-59%   heterogenous         tortuous with mild  plaque               +----------+--------+--------+--------+--------------------+-------------------+  ECA        117      7                                      intimal thickening   +----------+--------+--------+--------+--------------------+-------------------+ +----------+--------+--------+--------+-------------------+             PSV cm/s EDV cm/s Describe Arm Pressure (mmHG)  +----------+--------+--------+--------+-------------------+  Subclavian 58                                              +----------+--------+--------+--------+-------------------+ +---------+--------+--+--------+-+  Vertebral PSV cm/s 37 EDV cm/s 4  +---------+--------+--+--------+-+  Summary: Right Carotid: Velocities in the right ICA are consistent with a 60-79%                stenosis. See technical comments listed above. Left Carotid: Velocities in the left ICA are consistent with a 40-59% stenosis.               See technical comments listed above. Vertebrals:   Bilateral vertebral arteries demonstrate antegrade flow. Subclavians: Normal flow hemodynamics were seen in bilateral subclavian              arteries. *See table(s) above for measurements and observations.  Electronically signed by Antony Contras MD on 12/22/2018 at 1:07:42 PM.    Final     Microbiology: Recent Results (from the past 240 hour(s))  Culture, blood (routine x 2)     Status: None   Collection Time: 12/17/18 10:13 AM   Specimen: BLOOD RIGHT ARM  Result Value Ref Range Status   Specimen Description BLOOD RIGHT ARM  Final   Special Requests   Final    AEROBIC BOTTLE ONLY Blood Culture results may not be optimal due to an inadequate volume of blood received in culture bottles   Culture   Final    NO GROWTH 5 DAYS Performed at [facility], [place] [address], [place], [place] [address]    Report Status 12/22/2018 FINAL  Final  Culture, blood (routine x 2)     Status: None   Collection Time: 12/17/18 10:18 AM   Specimen: BLOOD LEFT ARM  Result Value Ref Range Status   Specimen Description BLOOD LEFT ARM  Final   Special Requests AEROBIC BOTTLE ONLY Blood Culture adequate volume  Final   Culture   Final    NO GROWTH 5 DAYS Performed at [facility], [place] [address], [place], [place] [address]    Report Status 12/22/2018 FINAL  Final     Labs: Basic Metabolic Panel: Recent Labs  Lab 12/19/18 0500 12/20/18 0402 12/22/18 0823 12/25/18 0849  NA 136 138 138 137  K 3.5 4.1 4.3 4.0  CL 98 102 102 107  CO2 31 26 23  21*  GLUCOSE 138* 126* 128* 173*  BUN 15 20 23* 24*  CREATININE 1.19* 1.27* 1.34* 1.19*  CALCIUM 8.8* 9.0 9.2 9.7   Liver Function Tests: No results for input(s): AST, ALT, ALKPHOS, BILITOT, PROT, ALBUMIN in the last 168 hours. No results for input(s): LIPASE, AMYLASE in the last 168 hours. No results for input(s): AMMONIA in the last 168 hours. CBC: Recent Labs  Lab 12/22/18  0823  WBC 11.6*  HGB 12.1  HCT 39.3  MCV 102.6*  PLT 420*    Cardiac Enzymes: No results for input(s): CKTOTAL, CKMB, CKMBINDEX, TROPONINI in the last 168 hours. BNP: BNP (last 3 results) No results for input(s): BNP in the last 8760 hours.  ProBNP (last 3 results) No results for input(s): PROBNP in the last 8760 hours.  CBG: Recent Labs  Lab 12/24/18 1129 12/24/18 1641 12/24/18 2054 12/25/18 0735 12/25/18 1126  GLUCAP 188* 163* 178* 125* 187*       Signed:  Kayleen Memos, MD [facility] 12/25/2018, 1:20 PM

## 2018-12-25 NOTE — Progress Notes (Signed)
Patient discharged in stable condition. No new neuro changes. I reviewed all her discharge instructions. D/c'd via wheelchair with CPAP, Foldable chair and prescription. New further questions

## 2018-12-25 NOTE — TOC Initial Note (Signed)
Transition of Care [facility]) - Initial/Assessment Note    Patient Details  Name: Pamela Carney MRN: [id number] Date of Birth: [birth year]  [facility] CM/SW Contact:    Erenest Rasher, RN Phone Number: [phone] 12/25/2018, 2:17 PM  Clinical Narrative:                 Spoke to pt and contacted [place] for shower stool with back. She has RW at home. States she recently received Medicaid. She has new PCP, [facility], waiting for her new Medicaid card. Her pharmacy is closed on Sunday. [facility] [address].   Expected Discharge Plan: Home/Self Care Barriers to Discharge: No Barriers Identified   Patient Goals and CMS Choice Patient states their goals for this hospitalization and ongoing recovery are:: "to get better"   Choice offered to / list presented to : NA  Expected Discharge Plan and Services Expected Discharge Plan: Home/Self Care In-house Referral: PCP / [facility] Discharge Planning Services: CM Consult, Medication Assistance Post Acute Care Choice: NA Living arrangements for the past 2 months: Single Family Home Expected Discharge Date: 12/25/18               DME Arranged: Shower stool DME Agency: [employer] Date DME Agency Contacted: 12/25/18 Time DME Agency Contacted: 74 Representative spoke with at DME Agency: [place] Arranged: NA [place] Agency: NA        Prior Living Arrangements/Services Living arrangements for the past 2 months: [place] Lives with:: Minor Children Patient language and need for interpreter reviewed:: Yes Do you feel safe going back to the place where you live?: Yes      Need for Family Participation in Patient Care: No (Comment) Care giver support system in place?: No (comment) Current home services: DME(rolling walker) Criminal Activity/Legal Involvement Pertinent to Current Situation/Hospitalization: No -  Comment as needed  Activities of Daily Living Home Assistive Devices/Equipment: Walker (specify type), CBG Meter(front wheel walker: ) ADL Screening (condition at time of admission) Patient's cognitive ability adequate to safely complete daily activities?: Yes Is the patient deaf or have difficulty hearing?: No Does the patient have difficulty seeing, even when wearing glasses/contacts?: No Does the patient have difficulty concentrating, remembering, or making decisions?: No Patient able to express need for assistance with ADLs?: Yes Does the patient have difficulty dressing or bathing?: No Independently performs ADLs?: Yes (appropriate for developmental age) Does the patient have difficulty walking or climbing stairs?: No Weakness of Legs: None Weakness of Arms/Hands: None  Permission Sought/Granted Permission sought to share information with : Case Manager, PCP, Family Supports Permission granted to share information with : Yes, Verbal Permission Granted     Permission granted to share info w AGENCY: [place]        Emotional Assessment Appearance:: Appears stated age Attitude/Demeanor/Rapport: Gracious Affect (typically observed): Accepting, Calm, Pleasant Orientation: : Oriented to Self, Oriented to Place, Oriented to  Time, Oriented to Situation Alcohol / Substance Use: Not Applicable Psych Involvement: No (comment)  Admission diagnosis:  Dehydration [E86.0] AKI (acute kidney injury) ([place]) [N17.9] Ureteral stone with hydronephrosis [N13.2] Septic shock ([place]) [A41.9, R65.21] Patient Active Problem List   Diagnosis Date Noted  . Cerebral embolism with cerebral infarction 12/22/2018  . Septic shock ([place]) 12/15/2018  . Ureteral stone with hydronephrosis   . AKI (acute kidney injury) ([place])   . New onset type 2  diabetes mellitus ([place]) 02/01/2018  . Bipolar 1 disorder ([place]) 02/01/2018  . IUD (intrauterine device) in place 02/01/2018  . Obesity 02/01/2018  . Healthcare  maintenance 02/01/2018  . CKD (chronic kidney disease), stage III 12/28/2017  . 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 Acute cystitis with hematuria   . Chronic interstitial nephritis 12/24/2017  . Generalized weakness 12/24/2017  . Sepsis ([place]) 12/24/2017  . Hypokalemia 12/24/2017  . Oral candidiasis 12/24/2017  . Neuropathy involving both lower extremities 01/31/2014  . Perimenopausal 12/29/2012  . Family planning, IUD (intrauterine device) check/reinsertion/removal 12/28/2012  . Pain 12/27/2012   PCP:  Patient, No Pcp Per Pharmacy:   [facility], [address] [place] [address] Phone: [phone] Fax: [phone]  [id number] [place], [place] [id number] [place] [id number] Phone: [phone] Fax: [phone]     Social Determinants of Health (SDOH) Interventions    Readmission Risk Interventions No flowsheet data found.

## 2018-12-25 NOTE — Progress Notes (Signed)
TOC CM spoke to pt and she has Medicaid. [place] for shower stool with back to be delivered to room. Contacted attending [facility]. Her pharmacy is closed on Sunday. [place], [place] ED TOC CM [phone]

## 2018-12-26 ENCOUNTER — Encounter (HOSPITAL_COMMUNITY): Payer: Self-pay | Admitting: Cardiology

## 2019-01-04 ENCOUNTER — Ambulatory Visit: Payer: 59

## 2019-01-05 ENCOUNTER — Other Ambulatory Visit: Payer: Self-pay

## 2019-01-05 ENCOUNTER — Ambulatory Visit (INDEPENDENT_AMBULATORY_CARE_PROVIDER_SITE_OTHER): Payer: 59 | Admitting: Student

## 2019-01-05 DIAGNOSIS — I639 Cerebral infarction, unspecified: Secondary | ICD-10-CM

## 2019-01-05 LAB — CUP PACEART INCLINIC DEVICE CHECK
Date Time Interrogation Session: 20201015102436
Implantable Pulse Generator Implant Date: 20201002

## 2019-01-05 NOTE — Progress Notes (Signed)
ILR wound check in clinic. Steri strips removed. Wound well healed. Home monitor transmitting nightly. No episodes. Questions answered. R waves 0.25 mV

## 2019-01-10 ENCOUNTER — Other Ambulatory Visit: Payer: Self-pay

## 2019-01-10 ENCOUNTER — Ambulatory Visit: Payer: 59 | Attending: Nurse Practitioner | Admitting: Nurse Practitioner

## 2019-01-13 ENCOUNTER — Other Ambulatory Visit: Payer: Self-pay

## 2019-01-13 ENCOUNTER — Ambulatory Visit: Payer: 59 | Attending: Internal Medicine

## 2019-01-13 VITALS — BP 88/60 | HR 70

## 2019-01-13 DIAGNOSIS — R2689 Other abnormalities of gait and mobility: Secondary | ICD-10-CM | POA: Insufficient documentation

## 2019-01-13 DIAGNOSIS — M6281 Muscle weakness (generalized): Secondary | ICD-10-CM | POA: Diagnosis present

## 2019-01-13 NOTE — Therapy (Signed)
[facility] [address] [place], [place], [address] Phone: [phone]   Fax:  [phone]  Physical Therapy Evaluation  Patient Details  Name: Pamela Carney MRN: [id number] Date of Birth: [birth year] Referring Provider (PT): Frann Rider   Encounter Date: 01/13/2019  PT End of Session - 01/13/19 0855    Visit Number  1    Number of Visits  11    Date for PT Re-Evaluation  03/14/19    PT Start Time  0852    PT Stop Time  0932    PT Time Calculation (min)  40 min    Activity Tolerance  Patient tolerated treatment well    Behavior During Therapy  [facility] for tasks assessed/performed       Past Medical History:  Diagnosis Date  . Abnormal Pap smear   . AKI (acute kidney injury) ([place]) 12/28/2017  . Anxiety   . Arthritis    bil knees, neck  . Bipolar 1 disorder ([place])   . Cataract    Mild  . Cholelithiasis 12/23/2017   noted on CT renal, pt unaware  . Chronic kidney disease (CKD), stage III (moderate)   . Cystitis   . Diabetes mellitus without complication ([place])   . Genital warts    Hx of genital  . GERD (gastroesophageal reflux disease)   . Heart murmur    childhood  . Hematuria   . History of blood transfusion   . History of ectopic pregnancy   . History of gestational diabetes   . History of kidney stones   . History of sepsis    after ectopic pregnancy  . Hypertension   . Idiopathic peripheral neuropathy    both feet  . Leg ulcer, left ([place])   . Low back pain   . Migraines   . Obesity   . Oral candidiasis   . Pneumonia   . PONV (postoperative nausea and vomiting)    prolonged sedation  . Recurrent UTI   . 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 Sigmoid diverticulosis 12/23/2017   noted on CT renal, pt unaware  . Ulcer of foot ([place])    Left  . Weakness     Past Surgical History:  Procedure  Laterality Date  . BUBBLE STUDY  12/23/2018   Procedure: BUBBLE STUDY;  Surgeon: Lelon Perla, MD;  Location: [facility] ENDOSCOPY;  Service: Cardiovascular;;  . CERVICAL CONE BIOPSY    . CESAREAN SECTION     x3  . CYSTOSCOPY W/ URETERAL STENT PLACEMENT Right 12/15/2018   Procedure: Cystoscopy, right retrograde ureteropyelogram, fluoroscopic interpretation, right double-J stent placement (24 cm x 6 [place]);  Surgeon: Franchot Gallo, MD;  Location: [place];  Service: Urology;  Laterality: Right;  . CYSTOSCOPY/URETEROSCOPY/HOLMIUM LASER/STENT PLACEMENT Right 02/04/2018   Procedure: CYSTOSCOPY/URETEROSCOPY/RETROGRADE PYELOGRAM/HOLMIUM LASER/STENT PLACEMENT;  Surgeon: Alexis Frock, MD;  Location: [facility];  Service: Urology;  Laterality: Right;  . CYSTOSCOPY/URETEROSCOPY/HOLMIUM LASER/STENT PLACEMENT Right 02/07/2018   Procedure: CYSTOSCOPY LEFT STENT EXCHANGE;  Surgeon: Alexis Frock, MD;  Location: [facility];  Service: Urology;  Laterality: Right;  . DILATION AND CURETTAGE OF UTERUS    . ECTOPIC PREGNANCY SURGERY    . IR NEPHROSTOMY PLACEMENT LEFT  12/25/2017  . LOOP RECORDER INSERTION N/A 12/23/2018   Procedure: LOOP RECORDER INSERTION;  Surgeon: Constance Haw, MD;  Location: [facility] CV LAB;  Service: Cardiovascular;  Laterality: N/A;  .  NEPHROLITHOTOMY Left 02/04/2018   Procedure: NEPHROLITHOTOMY PERCUTANEOUS;  Surgeon: Alexis Frock, MD;  Location: [facility];  Service: Urology;  Laterality: Left;  3 HRS  . NEPHROLITHOTOMY Left 02/07/2018   Procedure: SECOND LOOK NEPHROLITHOTOMY PERCUTANEOUS;  Surgeon: Alexis Frock, MD;  Location: [facility];  Service: Urology;  Laterality: Left;  2 HRS  . OVARIAN CYST REMOVAL    . TEE WITHOUT CARDIOVERSION N/A 12/23/2018   Procedure: TRANSESOPHAGEAL ECHOCARDIOGRAM (TEE);  Surgeon: Lelon Perla, MD;  Location: [facility] ENDOSCOPY;  Service: Cardiovascular;  Laterality: N/A;  . UNILATERAL SALPINGECTOMY     Pt unsure of which fallopian tube was removed  .  WISDOM TOOTH EXTRACTION      Vitals:   01/13/19 0927 01/13/19 0928  BP: 94/60 (!) 88/60  Pulse:  70     Subjective Assessment - 01/13/19 0857    Subjective  53 y/o female hospitalized 9/23 to 10/4 with septic shock and CVA. Post Cystoscopy, right retrograde ureteropyelogram, fluoroscopic interpretation, right double-J stent placement on 12/15/18 by Dr. Diona Fanti. MRI showed acute/subacute ischemic CVA left medial occipital lobe. Right homonymous hemianopsia starting 1 week prior to admission. Also had UTI. Pt reports that she has also noted she is retaining some fluid in legs. Is worse later during the day. Reports biggest issue is right vision loss. She also reports that her walking is not as good going to the right at times. Pt struggles with stairs. Pt will be returning to urologist as needs another surgery to remove kidney stone.    Pertinent History  PMHx of Bipolar Type 1, DM, CKD stage III, HTN, OSA, cartotid stenosis, polyneuropathy    Patient Stated Goals  Pt wants to be able to walk better.    Currently in Pain?  Yes   neuropathy   Pain Location  Foot    Pain Orientation  Right;Left    Pain Descriptors / Indicators  Aching    Pain Frequency  Intermittent    Pain Relieving Factors  neurontin         [facility] PT Assessment - 01/13/19 0900      Assessment   Medical Diagnosis  septic shock and CVA    Referring Provider (PT)  Frann Rider    Onset Date/Surgical Date  12/14/18    Hand Dominance  Left    Prior Therapy  none      Precautions   Precautions  Fall      Balance Screen   Has the patient fallen in the past 6 months  No    Has the patient had a decrease in activity level because of a fear of falling?   No    Is the patient reluctant to leave their home because of a fear of falling?   No   only scared on stairs     [place] residence    Living Arrangements  Spouse/significant other;Children    Available Help at Discharge   Family    Type of [place] to enter    Entrance Stairs-Number of Steps  3    Entrance Stairs-Rails  None    Home Layout  Two level    Alternate Level Stairs-Number of Steps  12    Alternate [place] - 2 wheels;Shower seat;Grab bars - tub/shower      Prior Function   Level of Independence  Independent with household  mobility without device;Independent with community mobility without device    Vocation  Unemployed    Leisure  reading, watch TV, play games on computer, cook      Cognition   Overall Cognitive Status  Within Functional Limits for tasks assessed      Observation/Other Assessments   Observations  intact skin      Sensation   Light Touch  Impaired Detail    Additional Comments  decreased light touch in left fingertips and left toes. No right peripheral fields, 10 cm line bisection test at 5.5 cm.      Coordination   Gross Motor Movements are Fluid and Coordinated  Yes   Intact RAMs     ROM / Strength   AROM / PROM / Strength  Strength      Strength   Strength Assessment Site  Shoulder;Elbow;Hip;Ankle;Knee    Right/Left Shoulder  Right;Left    Right Shoulder Flexion  4+/5    Left Shoulder Flexion  4+/5    Right/Left Elbow  Right;Left    Right Elbow Flexion  5/5    Right Elbow Extension  5/5    Left Elbow Flexion  5/5    Left Elbow Extension  5/5    Right/Left Hip  Right;Left    Right Hip Flexion  4+/5    Left Hip Flexion  4/5   pain in left hip with resistance   Right/Left Knee  Right;Left    Right Knee Flexion  4/5    Right Knee Extension  5/5    Left Knee Flexion  4/5    Left Knee Extension  5/5    Right/Left Ankle  Right;Left    Right Ankle Dorsiflexion  4+/5    Left Ankle Dorsiflexion  4+/5      Transfers   Transfers  Sit to Stand;Stand to Sit    Sit to Stand  7: Independent;6: Modified independent (Device/Increase time)    Stand to Sit  7: Independent;6: Modified independent  (Device/Increase time)      Ambulation/Gait   Ambulation/Gait  Yes    Ambulation/Gait Assistance  5: Supervision    Ambulation Distance (Feet)  75 Feet    Assistive device  None    Gait Pattern  Step-through pattern;Decreased step length - right;Decreased step length - left   increased lateral sway   Ambulation Surface  Level;Indoor    Gait Comments  Pt veers to right slightly at times.      Balance   Balance Assessed  Yes      Standardized Balance Assessment   Standardized Balance Assessment  --   modified CTSIB impaired eyes closed and on foam               Objective measurements completed on examination: See above findings.              PT Education - 01/13/19 0956    Education Details  Pt was instructed in PT plan of care. Instructed patient to take her time with position changes and drink more water due to low BP.    Person(s) Educated  Patient    Methods  Explanation    Comprehension  Verbalized understanding       PT Short Term Goals - 01/13/19 1224      PT SHORT TERM GOAL #1   Title  Pt will be independent with strengthening and balance HEP to continue gains on own.    Baseline  Currently does not have home program  Time  4    Period  Weeks    Status  New    Target Date  02/12/19      PT SHORT TERM GOAL #2   Title  Berg Balance, gait speed and stair assessments will be performed and goals updated after performed    Baseline  deferred at eval    Time  4    Period  Weeks    Status  New    Target Date  02/12/19      PT SHORT TERM GOAL #3   Title  Pt will increase Berg score by 4 points of more for improved balance and decreased fall risk.    Baseline  to be determined    Time  4    Period  Weeks    Status  New      PT SHORT TERM GOAL #4   Title  Pt will increase gait speed by >0.3m/s for improved ambulation safety.    Baseline  to be determined    Period  Weeks    Status  New    Target Date  02/12/19      PT SHORT TERM GOAL #5    Title  Pt will ambulate up/down 3 steps without rail supervision for improved ability to enter home.    Baseline  currently needs assistance of family    Time  4    Period  Weeks    Status  New    Target Date  02/12/19        PT Long Term Goals - 01/13/19 1232      PT LONG TERM GOAL #1   Title  Pt will ambulate >500' on varied surfaces independently with strategies to compensate for right field cut for improved community access.    Baseline  75' supervision    Time  6    Period  Weeks    Status  New    Target Date  02/25/19      PT LONG TERM GOAL #2   Title  Pt will ascend/descend a flight of steps with left rail mod I for improved access in home.    Baseline  currently not going upstairs.    Time  6    Period  Weeks    Status  New    Target Date  02/25/19      PT LONG TERM GOAL #3   Title  Pt will be able to maintain condition 2 and 4 on modified CTSIB for 10 sec for improved balance utilizing vestibular system more to help compensate for decreased vision.    Time  6    Period  Weeks    Status  New    Target Date  02/25/19             Plan - 01/13/19 1003    Clinical Impression Statement  53 y/o female hospitalized 9/23 to 10/4 with septic shock and left medial occipital lobe CVA with right homonymous hemianopsia. Pt also had UTI and reports seems to be ongoing. Following with urologist. Pt presents with impaired sensation from neuropathy. Impaired balance with poor vestibular use as relies more on vision which is now impaired due to CVA. Modified CTSIB testing only 1/4. Pt's gait speed is slower and veers to right at times. Will benefit from skilled PT to address strength, balance, and gait deficits.    Personal Factors and Comorbidities  Comorbidity 3+    Comorbidities  Bipolar Type 1, DM, CKD  stage III, HTN, OSA, cartotid stenosis, polyneuropathy    Examination-Activity Limitations  Stairs;Locomotion Level;Squat    Examination-Participation Restrictions   Community Activity;Driving;Meal Prep    Stability/Clinical Decision Making  Evolving/Moderate complexity    Clinical Decision Making  Moderate    Rehab Potential  Good    PT Frequency  2x / week   followed by 1x/week for 2 weeks   PT Duration  4 weeks   followed by 1x/week for 2 weeks   PT Treatment/Interventions  ADLs/Self Care Home Management;Gait training;Stair training;Functional mobility training;Therapeutic activities;Therapeutic exercise;Balance training;Patient/family education;Neuromuscular re-education;Visual/perceptual remediation/compensation    PT Next Visit Plan  Next visit get gait speed, assess stair negotiation, Berg Balance testing. Gait training with focus on scanning due to right homonymous hemianopsia. Monitor BP as was running low at visit.    Consulted and Agree with Plan of Care  Patient       Patient will benefit from skilled therapeutic intervention in order to improve the following deficits and impairments:  Abnormal gait, Decreased activity tolerance, Decreased balance, Dizziness, Impaired sensation, Impaired vision/preception, Decreased strength, Improper body mechanics  Visit Diagnosis: Other abnormalities of gait and mobility  Muscle weakness (generalized)     Problem List Patient Active Problem List   Diagnosis Date Noted  . Cerebral embolism with cerebral infarction 12/22/2018  . Septic shock ([place]) 12/15/2018  . Ureteral stone with hydronephrosis   . AKI (acute kidney injury) ([place])   . New onset type 2 diabetes mellitus ([place]) 02/01/2018  . Bipolar 1 disorder ([place]) 02/01/2018  . IUD (intrauterine device) in place 02/01/2018  . Obesity 02/01/2018  . Healthcare maintenance 02/01/2018  . CKD (chronic kidney disease), stage III 12/28/2017  . 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 Acute cystitis with hematuria   . Chronic interstitial nephritis 12/24/2017  . Generalized weakness 12/24/2017  . Sepsis ([place]) 12/24/2017  . Hypokalemia 12/24/2017  . Oral  candidiasis 12/24/2017  . Neuropathy involving both lower extremities 01/31/2014  . Perimenopausal 12/29/2012  . Family planning, IUD (intrauterine device) check/reinsertion/removal 12/28/2012  . Pain 12/27/2012    Electa Sniff, PT, DPT, NCS 01/13/2019, 12:42 PM  [facility] [address] [place], [place], [address] Phone: [phone]   Fax:  [phone]  Name: Pamela Carney MRN: [id number] Date of Birth: [birth year]

## 2019-01-16 ENCOUNTER — Other Ambulatory Visit: Payer: Self-pay

## 2019-01-16 ENCOUNTER — Encounter: Payer: Self-pay | Admitting: Nurse Practitioner

## 2019-01-16 ENCOUNTER — Ambulatory Visit: Payer: 59 | Attending: Nurse Practitioner | Admitting: Nurse Practitioner

## 2019-01-16 ENCOUNTER — Other Ambulatory Visit: Payer: Self-pay | Admitting: Nurse Practitioner

## 2019-01-16 DIAGNOSIS — G5793 Unspecified mononeuropathy of bilateral lower limbs: Secondary | ICD-10-CM

## 2019-01-16 DIAGNOSIS — E119 Type 2 diabetes mellitus without complications: Secondary | ICD-10-CM

## 2019-01-16 DIAGNOSIS — E785 Hyperlipidemia, unspecified: Secondary | ICD-10-CM

## 2019-01-16 DIAGNOSIS — I6349 Cerebral infarction due to embolism of other cerebral artery: Secondary | ICD-10-CM

## 2019-01-16 DIAGNOSIS — E1165 Type 2 diabetes mellitus with hyperglycemia: Secondary | ICD-10-CM

## 2019-01-16 DIAGNOSIS — G43109 Migraine with aura, not intractable, without status migrainosus: Secondary | ICD-10-CM

## 2019-01-16 MED ORDER — SENNOSIDES-DOCUSATE SODIUM 8.6-50 MG PO TABS: 2.0000 | ORAL_TABLET | Freq: Every evening | ORAL | 2 refills | Status: DC | PRN

## 2019-01-16 MED ORDER — ASPIRIN 81 MG PO TBEC: 81.0000 mg | DELAYED_RELEASE_TABLET | Freq: Every day | ORAL | 2 refills | Status: AC

## 2019-01-16 MED ORDER — INSULIN GLARGINE 100 UNIT/ML SOLOSTAR PEN: 15.0000 [IU] | PEN_INJECTOR | Freq: Every day | SUBCUTANEOUS | 99 refills | Status: DC

## 2019-01-16 MED ORDER — METFORMIN HCL 500 MG PO TABS: 500.0000 mg | ORAL_TABLET | Freq: Two times a day (BID) | ORAL | 0 refills | Status: DC

## 2019-01-16 MED ORDER — GABAPENTIN 300 MG PO CAPS: 300.0000 mg | ORAL_CAPSULE | Freq: Two times a day (BID) | ORAL | 0 refills | Status: DC

## 2019-01-16 MED ORDER — CLOPIDOGREL BISULFATE 75 MG PO TABS: 75.0000 mg | ORAL_TABLET | Freq: Every day | ORAL | 2 refills | Status: AC

## 2019-01-16 MED ORDER — PEN NEEDLES 31G X 8 MM MISC: 1.0000 | Freq: Every day | 3 refills | Status: DC

## 2019-01-16 MED ORDER — TOPIRAMATE 25 MG PO TABS: 25.0000 mg | ORAL_TABLET | Freq: Every day | ORAL | 2 refills | Status: DC

## 2019-01-16 MED ORDER — ATORVASTATIN CALCIUM 80 MG PO TABS: 80.0000 mg | ORAL_TABLET | Freq: Every day | ORAL | 2 refills | Status: AC

## 2019-01-16 NOTE — Progress Notes (Signed)
Virtual Visit via Telephone Note Due to national recommendations of social distancing due to [place] 19, telehealth visit is felt to be most appropriate for this patient at this time.  I discussed the limitations, risks, security and privacy concerns of performing an evaluation and management service by telephone and the availability of in person appointments. I also discussed with the patient that there may be a patient responsible charge related to this service. The patient expressed understanding and agreed to proceed.    I connected with Pamela Carney on 01/16/19  at   3:50 PM EDT  EDT by telephone and verified that I am speaking with the correct person using two identifiers.   Consent I discussed the limitations, risks, security and privacy concerns of performing an evaluation and management service by telephone and the availability of in person appointments. I also discussed with the patient that there may be a patient responsible charge related to this service. The patient expressed understanding and agreed to proceed.   Location of Patient: Private Residence   Location of Provider: [place] participating in Telemedicine visit: Geryl Rankins FNP-BC [place]    History of Present Illness: Telemedicine visit for: Headaches  Taking Tylenol 3-4 tablets every 4-6 hours. She has a history of migraines but can't recall what she has taken in the past to help relieve her pain. Endorses photosensitivity and sound sensitivity occurring with headaches. .  Mostly pain in the bilateral temples,  parietal and occipital region. Pain rated 7-8/10. She denies any other neurological symptoms including weakness, numbness, tingling, paresthesia. Blood pressure usually runs low and not high. She does not have a history of HTN. She has not started the topamax that was ordered for her on 10-27.  BP Readings from Last 3 Encounters:   01/26/19 (!) 86/58  01/24/19 113/81  01/13/19 (!) 88/60    DM TYPE 2 Not well controlled. Non adherent to diet and exercise. Trying to eat better. Taking lantus 15 units daily and metformin 500 mg BID. Symptoms of hyperglycemia include peripheral neuropathy. She takes gabapentin 300 mg BID. TAKING STATIN. Blood pressure too low for ACE/ARB Lab Results  Component Value Date   HGBA1C 8.6 (H) 12/15/2018   Past Medical History:  Diagnosis Date  . Abnormal Pap smear   . AKI (acute kidney injury) ([place]) 12/28/2017  . Anxiety   . Arthritis    bil knees, neck  . Bipolar 1 disorder ([place])   . Cataract    Mild  . Cholelithiasis 12/23/2017   noted on CT renal, pt unaware  . Chronic kidney disease (CKD), stage III (moderate)   . Cystitis   . Depression   . Diabetes mellitus without complication ([place])    type 2  . Genital warts    Hx of genital  . GERD (gastroesophageal reflux disease)   . Heart murmur    childhood  . Hematuria   . History of blood transfusion   . History of ectopic pregnancy   . History of gestational diabetes   . History of kidney stones   . History of sepsis    after ectopic pregnancy  . Idiopathic peripheral neuropathy    both feet  . Leg ulcer, left ([place])   . Low back pain   . Migraines   . Obesity   . Oral candidiasis   . Pneumonia   . PONV (postoperative nausea and vomiting)    prolonged sedation  .  Recurrent UTI   . 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 Sigmoid diverticulosis 12/23/2017   noted on CT renal, pt unaware  . Sleep apnea    uses cpap  . Stroke [facility])    Cryptogenic  . Ulcer of foot ([place])    Left  . Weakness     Past Surgical History:  Procedure Laterality Date  . BUBBLE STUDY  12/23/2018   Procedure: BUBBLE STUDY;  Surgeon: Lelon Perla, MD;  Location: [facility] ENDOSCOPY;  Service: Cardiovascular;;  . CERVICAL CONE BIOPSY    . CESAREAN  SECTION     x3  . CYSTOSCOPY W/ URETERAL STENT PLACEMENT Right 12/15/2018   Procedure: Cystoscopy, right retrograde ureteropyelogram, fluoroscopic interpretation, right double-J stent placement (24 cm x 6 [place]);  Surgeon: Franchot Gallo, MD;  Location: [facility];  Service: Urology;  Laterality: Right;  . CYSTOSCOPY/URETEROSCOPY/HOLMIUM LASER/STENT PLACEMENT Right 02/04/2018   Procedure: CYSTOSCOPY/URETEROSCOPY/RETROGRADE PYELOGRAM/HOLMIUM LASER/STENT PLACEMENT;  Surgeon: Alexis Frock, MD;  [facility];  Service: Urology;  Laterality: Right;  . CYSTOSCOPY/URETEROSCOPY/HOLMIUM LASER/STENT PLACEMENT Right 02/07/2018   Procedure: CYSTOSCOPY LEFT STENT EXCHANGE;  Surgeon: Alexis Frock, MD;  [facility]: [facility];  Service: Urology;  Laterality: Right;  . DILATION AND CURETTAGE OF UTERUS    . ECTOPIC PREGNANCY SURGERY    . IR NEPHROSTOMY PLACEMENT LEFT  12/25/2017  . LOOP RECORDER INSERTION N/A 12/23/2018   Procedure: LOOP RECORDER INSERTION;  Surgeon: Constance Haw, MD;  Location: [facility] LAB;  Service: Cardiovascular;  Laterality: N/A;  . NEPHROLITHOTOMY Left 02/04/2018   Procedure: NEPHROLITHOTOMY PERCUTANEOUS;  Surgeon: Alexis Frock, MD;  [facility];  Service: Urology;  Laterality: Left;  3 HRS  . NEPHROLITHOTOMY Left 02/07/2018   Procedure: SECOND LOOK NEPHROLITHOTOMY PERCUTANEOUS;  Surgeon: Alexis Frock, MD;  [facility];  Service: Urology;  Laterality: Left;  2 HRS  . OVARIAN CYST REMOVAL    . TEE WITHOUT CARDIOVERSION N/A 12/23/2018   Procedure: TRANSESOPHAGEAL ECHOCARDIOGRAM (TEE);  Surgeon: Lelon Perla, MD;  Location: [facility] ENDOSCOPY;  Service: Cardiovascular;  Laterality: N/A;  . UNILATERAL SALPINGECTOMY     Pt unsure of which fallopian tube was removed  . WISDOM TOOTH EXTRACTION      Family History  Problem Relation Age of Onset  . Diabetes Paternal Grandfather   . COPD Paternal Grandfather   . Heart disease Paternal Grandfather   . Cancer  Paternal Grandfather        bone  . Cancer Paternal Grandmother   . Heart disease Paternal Grandmother   . Cancer Father   . Hypertension Father   . Heart attack Father   . Alcohol abuse Father   . Depression Father   . Heart failure Mother   . Diabetes Mother   . Heart disease Mother   . Hyperlipidemia Mother   . Hypertension Mother   . Heart attack Mother   . Peripheral vascular disease Mother   . Depression Mother   . COPD Mother   . Hypertension Sister   . Heart attack Sister   . Stroke Maternal Grandmother     Social History   Socioeconomic History  . Marital status: Married    Spouse name: Not on file  . Number of children: Not on file  . Years of education: Not on file  . Highest education level: Not on file  Occupational History  . Not on file  Social Needs  .  Financial resource strain: Not on file  . Food insecurity    Worry: Not on file    Inability: Not on file  . Transportation needs    Medical: Not on file    Non-medical: Not on file  Tobacco Use  . Smoking status: Current Every Day Smoker    Packs/day: 1.00    Years: 37.00    Pack years: 37.00    Types: Cigarettes  . Smokeless tobacco: Never Used  Substance and Sexual Activity  . Alcohol use: No    Alcohol/week: 0.0 standard drinks  . Drug use: No  . Sexual activity: Not Currently    Birth control/protection: I.U.D.    Comment: Mirena  Lifestyle  . Physical activity    Days per week: Not on file    Minutes per session: Not on file  . Stress: Not on file  Relationships  . Social Herbalist on phone: Not on file    Gets together: Not on file    Attends religious service: Not on file    Active member of club or organization: Not on file    Attends meetings of clubs or organizations: Not on file    Relationship status: Not on file  Other Topics Concern  . Not on file  Social History Narrative  . Not on file     Observations/Objective: Awake, alert and oriented x  3   Review of Systems  Constitutional: Negative for fever, malaise/fatigue and weight loss.  HENT: Negative.  Negative for nosebleeds.   Eyes: Negative.  Negative for blurred vision, double vision and photophobia.  Respiratory: Negative.  Negative for cough and shortness of breath.   Cardiovascular: Negative.  Negative for chest pain, palpitations and leg swelling.  Gastrointestinal: Negative.  Negative for heartburn, nausea and vomiting.  Musculoskeletal: Negative.  Negative for myalgias.  Neurological: Positive for headaches. Negative for dizziness, focal weakness and seizures.  Psychiatric/Behavioral: Negative.  Negative for suicidal ideas.    Assessment and Plan: Pamela Carney was seen today for new patient (initial visit).  Diagnoses and all orders for this visit:  Migraine with aura and without status migrainosus, not intractable Start topamax as prescribed previously Go the the EMERGENCY room immediately for any stroke like symptoms.   Uncontrolled type 2 diabetes mellitus with hyperglycemia (HCC) -     Insulin Glargine (LANTUS) 100 UNIT/ML Solostar Pen; Inject 15 Units into the skin at bedtime. -     Insulin Pen Needle (PEN NEEDLES) 31G X 8 MM MISC; 1 each by Does not apply route daily. -     metFORMIN (GLUCOPHAGE) 500 MG tablet; Take 1 tablet (500 mg total) by mouth 2 (two) times daily with a meal. 'Continue blood sugar control as discussed in office today, low carbohydrate diet, and regular physical exercise as tolerated, 150 minutes per week (30 min each day, 5 days per week, or 50 min 3 days per week). Keep blood sugar logs with fasting goal of 90-130 mg/dl, post prandial (after you eat) less than 180.  For Hypoglycemia: BS <60 and Hyperglycemia BS >400; contact the clinic ASAP. Annual eye exams and foot exams are recommended.  Neuropathy involving both lower extremities -     gabapentin (NEURONTIN) 300 MG capsule; Take 1 capsule (300 mg total) by mouth 2 (two) times  daily.  Dyslipidemia, goal LDL below 70 -     atorvastatin (LIPITOR) 80 MG tablet; Take 1 tablet (80 mg total) by mouth daily at 6 PM. Lab Results  Component Value Date   LDLCALC 99 12/15/2018  LDL not at goal.  INSTRUCTIONS: Work on a low fat, heart healthy diet and participate in regular aerobic exercise program by working out at least 150 minutes per week; 5 days a week-30 minutes per day. Avoid red meat/beef/steak,  fried foods. junk foods, sodas, sugary drinks, unhealthy snacking, alcohol and smoking.  Drink at least 80 oz of water per day and monitor your carbohydrate intake daily.    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 -     aspirin 81 MG EC tablet; Take 1 tablet (81 mg total) by mouth daily.    Follow Up Instructions Return in about 2 months (around 03/18/2019).     I discussed the assessment and treatment plan with the patient. The patient was provided an opportunity to ask questions and all were answered. The patient agreed with the plan and demonstrated an understanding of the instructions.   The patient was advised to call back or seek an in-person evaluation if the symptoms worsen or if the condition fails to improve as anticipated.  I provided  21 minutes of non-face-to-face time during this encounter including median intraservice time, reviewing previous notes, labs, imaging, medications and explaining diagnosis and management.  [name], FNP-BC

## 2019-01-18 ENCOUNTER — Other Ambulatory Visit: Payer: Self-pay | Admitting: Urology

## 2019-01-19 ENCOUNTER — Ambulatory Visit: Payer: 59 | Admitting: Physical Therapy

## 2019-01-24 ENCOUNTER — Other Ambulatory Visit (HOSPITAL_COMMUNITY)
Admission: RE | Admit: 2019-01-24 | Discharge: 2019-01-24 | Disposition: A | Discharge status: Home / Self Care | Payer: 59 | Source: Ambulatory Visit | Attending: Urology | Admitting: Urology

## 2019-01-24 ENCOUNTER — Other Ambulatory Visit: Payer: Self-pay

## 2019-01-24 ENCOUNTER — Ambulatory Visit: Payer: 59 | Attending: Internal Medicine | Admitting: Physical Therapy

## 2019-01-24 ENCOUNTER — Encounter: Payer: Self-pay | Admitting: Physical Therapy

## 2019-01-24 VITALS — BP 113/81

## 2019-01-24 DIAGNOSIS — M6281 Muscle weakness (generalized): Secondary | ICD-10-CM | POA: Insufficient documentation

## 2019-01-24 DIAGNOSIS — Z20828 Contact with and (suspected) exposure to other viral communicable diseases: Secondary | ICD-10-CM | POA: Diagnosis not present

## 2019-01-24 DIAGNOSIS — R2689 Other abnormalities of gait and mobility: Secondary | ICD-10-CM | POA: Insufficient documentation

## 2019-01-24 DIAGNOSIS — Z01812 Encounter for preprocedural laboratory examination: Secondary | ICD-10-CM | POA: Insufficient documentation

## 2019-01-25 ENCOUNTER — Inpatient Hospital Stay: Payer: 59 | Admitting: Adult Health

## 2019-01-25 ENCOUNTER — Telehealth: Payer: Self-pay | Admitting: *Deleted

## 2019-01-25 LAB — NOVEL CORONAVIRUS, NAA (HOSP ORDER, SEND-OUT TO REF LAB; TAT 18-24 HRS): SARS-CoV-2, NAA: NOT DETECTED

## 2019-01-25 NOTE — Progress Notes (Deleted)
[facility] [address] [place] [address] [phone]       HOSPITAL FOLLOW UP NOTE  [name] Date of Birth:  1965/05/08 Medical Record Number:  [id number]   Reason for Referral:  hospital stroke follow up    CHIEF COMPLAINT:  No chief complaint on file.   HPI: Pamela Carney being seen today for in office hospital follow-up regarding ***.  History obtained from *** and chart review. Reviewed all radiology images and labs personally.   ROS:   14 system review of systems performed and negative with exception of ***  PMH:  Past Medical History:  Diagnosis Date  . Abnormal Pap smear   . AKI (acute kidney injury) ([place]) 12/28/2017  . Anxiety   . Arthritis    bil knees, neck  . Bipolar 1 disorder ([place])   . Cataract    Mild  . Cholelithiasis 12/23/2017   noted on CT renal, pt unaware  . Chronic kidney disease (CKD), stage III (moderate)   . Cystitis   . Diabetes mellitus without complication ([place])   . Genital warts    Hx of genital  . GERD (gastroesophageal reflux disease)   . Heart murmur    childhood  . Hematuria   . History of blood transfusion   . History of ectopic pregnancy   . History of gestational diabetes   . History of kidney stones   . History of sepsis    after ectopic pregnancy  . Idiopathic peripheral neuropathy    both feet  . Leg ulcer, left ([place])   . Low back pain   . Migraines   . Obesity   . Oral candidiasis   . Pneumonia   . PONV (postoperative nausea and vomiting)    prolonged sedation  . Recurrent UTI   . 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 Sigmoid diverticulosis 12/23/2017   noted on CT renal, pt unaware  . Ulcer of foot ([place])    Left  . Weakness     PSH:  Past Surgical History:  Procedure Laterality Date  . BUBBLE STUDY  12/23/2018   Procedure: BUBBLE STUDY;  Surgeon: Lelon Perla, MD;   Location: [facility] ENDOSCOPY;  Service: Cardiovascular;;  . CERVICAL CONE BIOPSY    . CESAREAN SECTION     x3  . CYSTOSCOPY W/ URETERAL STENT PLACEMENT Right 12/15/2018   Procedure: Cystoscopy, right retrograde ureteropyelogram, fluoroscopic interpretation, right double-J stent placement (24 cm x 6 [place]);  Surgeon: Franchot Gallo, MD;  Location: [place];  Service: Urology;  Laterality: Right;  . CYSTOSCOPY/URETEROSCOPY/HOLMIUM LASER/STENT PLACEMENT Right 02/04/2018   Procedure: CYSTOSCOPY/URETEROSCOPY/RETROGRADE PYELOGRAM/HOLMIUM LASER/STENT PLACEMENT;  Surgeon: Alexis Frock, MD;  [facility];  Service: Urology;  Laterality: Right;  . CYSTOSCOPY/URETEROSCOPY/HOLMIUM LASER/STENT PLACEMENT Right 02/07/2018   Procedure: CYSTOSCOPY LEFT STENT EXCHANGE;  Surgeon: Alexis Frock, MD;  [facility];  Service: Urology;  Laterality: Right;  . DILATION AND CURETTAGE OF UTERUS    . ECTOPIC PREGNANCY SURGERY    . IR NEPHROSTOMY PLACEMENT LEFT  12/25/2017  . LOOP RECORDER INSERTION N/A 12/23/2018   Procedure: LOOP RECORDER INSERTION;  Surgeon: Constance Haw, MD;  Location: [place] CV LAB;  Service: Cardiovascular;  Laterality: N/A;  . NEPHROLITHOTOMY Left 02/04/2018   Procedure: NEPHROLITHOTOMY PERCUTANEOUS;  Surgeon: Alexis Frock, MD;  [facility];  Service: Urology;  Laterality: Left;  3 HRS  .  NEPHROLITHOTOMY Left 02/07/2018   Procedure: SECOND LOOK NEPHROLITHOTOMY PERCUTANEOUS;  Surgeon: Alexis Frock, MD;  [facility];  Service: Urology;  Laterality: Left;  2 HRS  . OVARIAN CYST REMOVAL    . TEE WITHOUT CARDIOVERSION N/A 12/23/2018   Procedure: TRANSESOPHAGEAL ECHOCARDIOGRAM (TEE);  Surgeon: Lelon Perla, MD;  Location: [facility] ENDOSCOPY;  Service: Cardiovascular;  Laterality: N/A;  . UNILATERAL SALPINGECTOMY     Pt unsure of which fallopian tube was removed  . WISDOM TOOTH EXTRACTION      Social History:  Social History   Socioeconomic History  . Marital  status: Married    Spouse name: Not on file  . Number of children: Not on file  . Years of education: Not on file  . Highest education level: Not on file  Occupational History  . Not on file  Social Needs  . Financial resource strain: Not on file  . Food insecurity    Worry: Not on file    Inability: Not on file  . Transportation needs    Medical: Not on file    Non-medical: Not on file  Tobacco Use  . Smoking status: Current Every Day Smoker    Packs/day: 1.00    Years: 37.00    Pack years: 37.00    Types: Cigarettes  . Smokeless tobacco: Never Used  Substance and Sexual Activity  . Alcohol use: No    Alcohol/week: 0.0 standard drinks  . Drug use: No  . Sexual activity: Not Currently    Birth control/protection: Implant  Lifestyle  . Physical activity    Days per week: Not on file    Minutes per session: Not on file  . Stress: Not on file  Relationships  . Social Herbalist on phone: Not on file    Gets together: Not on file    Attends religious service: Not on file    Active member of club or organization: Not on file    Attends meetings of clubs or organizations: Not on file    Relationship status: Not on file  . Intimate partner violence    Fear of current or ex partner: Not on file    Emotionally abused: Not on file    Physically abused: Not on file    Forced sexual activity: Not on file  Other Topics Concern  . Not on file  Social History Narrative  . Not on file    Family History:  Family History  Problem Relation Age of Onset  . Diabetes Paternal Grandfather   . COPD Paternal Grandfather   . Heart disease Paternal Grandfather   . Cancer Paternal Grandfather        bone  . Cancer Paternal Grandmother   . Heart disease Paternal Grandmother   . Cancer Father   . Hypertension Father   . Heart attack Father   . Alcohol abuse Father   . Depression Father   . Heart failure Mother   . Diabetes Mother   . Heart disease Mother   .  Hyperlipidemia Mother   . Hypertension Mother   . Heart attack Mother   . Peripheral vascular disease Mother   . Depression Mother   . COPD Mother   . Hypertension Sister   . Heart attack Sister   . Stroke Maternal Grandmother     Medications:   Current Outpatient Medications on File Prior to Visit  Medication Sig Dispense Refill  . aspirin 81 MG EC tablet Take 1  tablet (81 mg total) by mouth daily. 30 tablet 2  . atorvastatin (LIPITOR) 80 MG tablet Take 1 tablet (80 mg total) by mouth daily at 6 PM. 90 tablet 2  . Blood Glucose Monitoring Suppl (ONETOUCH VERIO) w/Device KIT Use to check blood sugars every morning fasting and 2 hours after largest meal 1 kit 0  . clopidogrel (PLAVIX) 75 MG tablet Take 1 tablet (75 mg total) by mouth daily. 30 tablet 2  . gabapentin (NEURONTIN) 300 MG capsule Take 1 capsule (300 mg total) by mouth 2 (two) times daily. 180 capsule 0  . glucose blood (ONETOUCH VERIO) test strip Use to check blood sugars every morning fasting and 2 hours after largest meal 200 each 3  . Insulin Glargine (LANTUS) 100 UNIT/ML Solostar Pen Inject 15 Units into the skin at bedtime. 15 mL prn  . Insulin Pen Needle (PEN NEEDLES) 31G X 8 MM MISC 1 each by Does not apply route daily. 100 each 3  . metFORMIN (GLUCOPHAGE) 500 MG tablet Take 1 tablet (500 mg total) by mouth 2 (two) times daily with a meal. 180 tablet 0  . Multiple Vitamins-Minerals (ONE-A-DAY WOMENS 50+ ADVANTAGE) TABS Take 1 tablet by mouth daily.    . ONE TOUCH LANCETS MISC Use to check blood sugars every morning fasting and 2 hours after largest meal 200 each 3  . senna-docusate (SENOKOT-S) 8.6-50 MG tablet Take 2 tablets by mouth at bedtime as needed for mild constipation. 30 tablet 2  . topiramate (TOPAMAX) 25 MG tablet TAKE 1 TABLET BY MOUTH DAILY, MAY INCREASE BY ONE TABLET WEEKLY FOR A MAX OF 100MG 120 tablet 2   No current facility-administered medications on file prior to visit.     Allergies:   Allergies   Allergen Reactions  . Sulfa Antibiotics Hives and Swelling    Swelling site not recalled  . Latex Rash  . Tape Rash    Not tolerated well     Physical Exam  There were no vitals filed for this visit. There is no height or weight on file to calculate BMI. No exam data present  Depression screen [facility] 2/9 01/16/2019  Decreased Interest 1  Down, Depressed, Hopeless 3  PHQ - 2 Score 4  Altered sleeping 1  Tired, decreased energy 0  Change in appetite 0  Feeling bad or failure about yourself  3  Trouble concentrating 1  Moving slowly or fidgety/restless 0  Suicidal thoughts 0  PHQ-9 Score 9  Difficult doing work/chores -     General: well developed, well nourished, seated, in no evident distress Head: head normocephalic and atraumatic.   Neck: supple with no carotid or supraclavicular bruits Cardiovascular: regular rate and rhythm, no murmurs Musculoskeletal: no deformity Skin:  no rash/petichiae Vascular:  Normal pulses all extremities   Neurologic Exam Mental Status: Awake and fully alert. Oriented to place and time. Recent and remote memory intact. Attention span, concentration and fund of knowledge appropriate. Mood and affect appropriate.  Cranial Nerves: Fundoscopic exam reveals sharp disc margins. Pupils equal, briskly reactive to light. Extraocular movements full without nystagmus. Visual fields full to confrontation. Hearing intact. Facial sensation intact. Face, tongue, palate moves normally and symmetrically.  Motor: Normal bulk and tone. Normal strength in all tested extremity muscles. Sensory.: intact to touch , pinprick , position and vibratory sensation.  Coordination: Rapid alternating movements normal in all extremities. Finger-to-nose and heel-to-shin performed accurately bilaterally. Gait and Station: Arises from chair without difficulty. Stance is normal. Gait demonstrates  normal stride length and balance Reflexes: 1+ and symmetric. Toes downgoing.      NIHSS  *** Modified Rankin  *** CHA2DS2-VASc *** HAS-BLED ***   Diagnostic Data (Labs, Imaging, Testing)  CT HEAD WO CONTRAST ***  CT ANGIO HEAD W OR WO CONTRAST CT ANGIO NECK W OR WO CONTRAST ***  MR BRAIN WO CONTRAST ***  MR MRA HEAD  MR MRA NECK ***  ECHOCARDIOGRAM ***    ASSESSMENT: Pamela Carney is a 53 y.o. year old female presented with *** on *** secondary to ***. Vascular risk factors include ***.     PLAN:  1. *** : Continue {anticoagulants:31417}  and ***  for secondary stroke prevention. Maintain strict control of hypertension with blood pressure goal below 130/90, diabetes with hemoglobin A1c goal below 6.5% and cholesterol with LDL cholesterol (bad cholesterol) goal below 70 mg/dL.  I also advised the patient to eat a healthy diet with plenty of whole grains, cereals, fruits and vegetables, exercise regularly with at least 30 minutes of continuous activity daily and maintain ideal body weight. 2. HTN: Advised to continue current treatment regimen.  Today's BP ***.  Advised to continue to monitor at home along with continued follow-up with PCP for management 3. HLD: Advised to continue current treatment regimen along with continued follow-up with PCP for future prescribing and monitoring of lipid panel 4. DMII: Advised to continue to monitor glucose levels at home along with continued follow-up with PCP for management and monitoring    Follow up in *** or call earlier if needed   Greater than 50% of time during this 45 minute visit was spent on counseling, explanation of diagnosis of ***, reviewing risk factor management of ***, planning of further management along with potential future management, and discussion with patient and family answering all questions.    Frann Rider, AGNP-BC  [facility] [address] [place], [place] [address]  Phone [phone] Fax [phone] Note: This document was prepared with  digital dictation and possible smart phrase technology. Any transcriptional errors that result from this process are unintentional.

## 2019-01-25 NOTE — Therapy (Signed)
[facility] [address] [place], [place], [address] Phone: [phone]   Fax:  [phone]  Physical Therapy Treatment  Patient Details  Name: Pamela Carney MRN: [id number] Date of Birth: 1965/03/31 Referring Provider (PT): Frann Rider   Encounter Date: 01/24/2019  PT End of Session - 01/24/19 1328    Visit Number  2    Number of Visits  11    Date for PT Re-Evaluation  03/14/19    PT Start Time  [id number]    PT Stop Time  1400    PT Time Calculation (min)  43 min    Activity Tolerance  Patient tolerated treatment well    Behavior During Therapy  [facility] for tasks assessed/performed       Past Medical History:  Diagnosis Date  . Abnormal Pap smear   . AKI (acute kidney injury) ([place]) 12/28/2017  . Anxiety   . Arthritis    bil knees, neck  . Bipolar 1 disorder ([place])   . Cataract    Mild  . Cholelithiasis 12/23/2017   noted on CT renal, pt unaware  . Chronic kidney disease (CKD), stage III (moderate)   . Cystitis   . Diabetes mellitus without complication ([place])   . Genital warts    Hx of genital  . GERD (gastroesophageal reflux disease)   . Heart murmur    childhood  . Hematuria   . History of blood transfusion   . History of ectopic pregnancy   . History of gestational diabetes   . History of kidney stones   . History of sepsis    after ectopic pregnancy  . Idiopathic peripheral neuropathy    both feet  . Leg ulcer, left ([place])   . Low back pain   . Migraines   . Obesity   . Oral candidiasis   . Pneumonia   . PONV (postoperative nausea and vomiting)    prolonged sedation  . Recurrent UTI   . 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 Sigmoid diverticulosis 12/23/2017   noted on CT renal, pt unaware  . Ulcer of foot ([place])    Left  . Weakness     Past Surgical History:  Procedure Laterality Date  . BUBBLE  STUDY  12/23/2018   Procedure: BUBBLE STUDY;  Surgeon: Lelon Perla, MD;  Location: [facility] ENDOSCOPY;  Service: Cardiovascular;;  . CERVICAL CONE BIOPSY    . CESAREAN SECTION     x3  . CYSTOSCOPY W/ URETERAL STENT PLACEMENT Right 12/15/2018   Procedure: Cystoscopy, right retrograde ureteropyelogram, fluoroscopic interpretation, right double-J stent placement (24 cm x 6 [place]);  Surgeon: Franchot Gallo, MD;  Location: [facility];  Service: Urology;  Laterality: Right;  . CYSTOSCOPY/URETEROSCOPY/HOLMIUM LASER/STENT PLACEMENT Right 02/04/2018   Procedure: CYSTOSCOPY/URETEROSCOPY/RETROGRADE PYELOGRAM/HOLMIUM LASER/STENT PLACEMENT;  Surgeon: Alexis Frock, MD;  Location: [facility];  Service: Urology;  Laterality: Right;  . CYSTOSCOPY/URETEROSCOPY/HOLMIUM LASER/STENT PLACEMENT Right 02/07/2018   Procedure: CYSTOSCOPY LEFT STENT EXCHANGE;  Surgeon: Alexis Frock, MD;  Location: [facility];  Service: Urology;  Laterality: Right;  . DILATION AND CURETTAGE OF UTERUS    . ECTOPIC PREGNANCY SURGERY    . IR NEPHROSTOMY PLACEMENT LEFT  12/25/2017  . LOOP RECORDER INSERTION N/A 12/23/2018   Procedure: LOOP RECORDER INSERTION;  Surgeon: Constance Haw, MD;  Location: [facility] CV LAB;  Service: Cardiovascular;  Laterality: N/A;  . NEPHROLITHOTOMY Left 02/04/2018  Procedure: NEPHROLITHOTOMY PERCUTANEOUS;  Surgeon: Alexis Frock, MD;  Location: [facility];  Service: Urology;  Laterality: Left;  3 HRS  . NEPHROLITHOTOMY Left 02/07/2018   Procedure: SECOND LOOK NEPHROLITHOTOMY PERCUTANEOUS;  Surgeon: Alexis Frock, MD;  Location: [facility];  Service: Urology;  Laterality: Left;  2 HRS  . OVARIAN CYST REMOVAL    . TEE WITHOUT CARDIOVERSION N/A 12/23/2018   Procedure: TRANSESOPHAGEAL ECHOCARDIOGRAM (TEE);  Surgeon: Lelon Perla, MD;  Location: [facility] ENDOSCOPY;  Service: Cardiovascular;  Laterality: N/A;  . UNILATERAL SALPINGECTOMY     Pt unsure of which fallopian tube was removed  . WISDOM TOOTH EXTRACTION       Vitals:   01/24/19 1321 01/24/19 1335 01/24/19 1339  BP: (!) 128/96 (!) 132/94 113/81    Subjective Assessment - 01/24/19 1321    Subjective  No new complaints, does report she forgot her own phone number yesterday (has had it for about 2 years). No falls to report. Having pain still from her stent surgery.    Pertinent History  PMHx of Bipolar Type 1, DM, CKD stage III, HTN, OSA, cartotid stenosis, polyneuropathy    Patient Stated Goals  Pt wants to be able to walk better.    Currently in Pain?  Yes    Pain Score  8     Pain Location  Generalized   graft location on thigh for stent, headache as well   Pain Descriptors / Indicators  Aching;Sore    Pain Type  Chronic pain    Pain Onset  More than a month ago    Pain Frequency  Intermittent    Aggravating Factors   unknown- possibly stress, recent stroke    Pain Relieving Factors  neurontin, rest, Topomax, tylenol- none really help with the HA         [facility] PT Assessment - 01/24/19 1329      Transfers   Transfers  Sit to Stand;Stand to Sit    Sit to Stand  6: Modified independent (Device/Increase time)    Stand to Sit  6: Modified independent (Device/Increase time)      Ambulation/Gait   Ambulation/Gait  Yes    Ambulation/Gait Assistance  5: Supervision    Ambulation Distance (Feet)  80 Feet   x2, plus around gym with testing   Assistive device  None    Gait Pattern  Step-through pattern;Decreased step length - right;Decreased step length - left    Ambulation Surface  Level;Indoor      Standardized Balance Assessment   Standardized Balance Assessment  Berg Balance Test;10 meter walk test    10 Meter Walk  12.50 sec's= 2.62 ft/sec with no AD, supervision      Berg Balance Test   Sit to Stand  Able to stand without using hands and stabilize independently    Standing Unsupported  Able to stand safely 2 minutes    Sitting with Back Unsupported but Feet Supported on Floor or Stool  Able to sit safely and securely 2  minutes    Stand to Sit  Controls descent by using hands    Transfers  Able to transfer safely, definite need of hands    Standing Unsupported with Eyes Closed  Able to stand 10 seconds with supervision    Standing Unsupported with Feet Together  Able to place feet together independently and stand for 1 minute with supervision    From Standing, Reach Forward with Outstretched Arm  Can reach forward >12 cm safely (5")  7   From Standing Position, Pick up Object from [place] to pick up shoe, needs supervision    From Standing Position, Turn to Look Behind Over each Shoulder  Looks behind one side only/other side shows less weight shift   left>right side   Turn 360 Degrees  Needs close supervision or verbal cueing   increased dizziness after turning one direction   Standing Unsupported, Alternately Place Feet on Step/Stool  Able to complete >2 steps/needs minimal assist    Standing Unsupported, One Foot in Front  Able to take small step independently and hold 30 seconds    Standing on One Leg  Able to lift leg independently and hold equal to or more than 3 seconds    Total Score  39    Berg comment:  39/56= significant risk for falls           PT Short Term Goals - 01/13/19 1224      PT SHORT TERM GOAL #1   Title  Pt will be independent with strengthening and balance HEP to continue gains on own.    Baseline  Currently does not have home program    Time  4    Period  Weeks    Status  New    Target Date  02/12/19      PT SHORT TERM GOAL #2   Title  Berg Balance, gait speed and stair assessments will be performed and goals updated after performed    Baseline  deferred at eval    Time  4    Period  Weeks    Status  New    Target Date  02/12/19      PT SHORT TERM GOAL #3   Title  Pt will increase Berg score by 4 points of more for improved balance and decreased fall risk.    Baseline  to be determined    Time  4    Period  Weeks    Status  New      PT SHORT TERM GOAL #4    Title  Pt will increase gait speed by >0.53m/s for improved ambulation safety.    Baseline  to be determined    Period  Weeks    Status  New    Target Date  02/12/19      PT SHORT TERM GOAL #5   Title  Pt will ambulate up/down 3 steps without rail supervision for improved ability to enter home.    Baseline  currently needs assistance of family    Time  4    Period  Weeks    Status  New    Target Date  02/12/19        PT Long Term Goals - 01/13/19 1232      PT LONG TERM GOAL #1   Title  Pt will ambulate >500' on varied surfaces independently with strategies to compensate for right field cut for improved community access.    Baseline  75' supervision    Time  6    Period  Weeks    Status  New    Target Date  02/25/19      PT LONG TERM GOAL #2   Title  Pt will ascend/descend a flight of steps with left rail mod I for improved access in home.    Baseline  currently not going upstairs.    Time  6    Period  Weeks    Status  New  Target Date  02/25/19      PT LONG TERM GOAL #3   Title  Pt will be able to maintain condition 2 and 4 on modified CTSIB for 10 sec for improved balance utilizing vestibular system more to help compensate for decreased vision.    Time  6    Period  Weeks    Status  New    Target Date  02/25/19            Plan - 01/24/19 1328    Clinical Impression Statement  Today's skilled session was limited by elevated BP's, however was able to set baseline values for [facility] Test and 10 meter gait speed with baseline values to be updated on goals. Will plan to assess stair negotiaion next session. The pt is making steady progress toward goals and should benefit from continued PT to progress toward unmet goals.    Personal Factors and Comorbidities  Comorbidity 3+    Comorbidities  Bipolar Type 1, DM, CKD stage III, HTN, OSA, cartotid stenosis, polyneuropathy    Examination-Activity Limitations  Stairs;Locomotion Level;Squat     Examination-Participation Restrictions  Community Activity;Driving;Meal Prep    Stability/Clinical Decision Making  Evolving/Moderate complexity    Rehab Potential  Good    PT Frequency  2x / week   followed by 1x/week for 2 weeks   PT Duration  4 weeks   followed by 1x/week for 2 weeks   PT Treatment/Interventions  ADLs/Self Care Home Management;Gait training;Stair training;Functional mobility training;Therapeutic activities;Therapeutic exercise;Balance training;Patient/family education;Neuromuscular re-education;Visual/perceptual remediation/compensation    PT Next Visit Plan  assess stairs with pt next session, Gait training with focus on scanning due to right homonymous hemianopsia. Monitor BP as was running low at visit.    Consulted and Agree with Plan of Care  Patient       Patient will benefit from skilled therapeutic intervention in order to improve the following deficits and impairments:  Abnormal gait, Decreased activity tolerance, Decreased balance, Dizziness, Impaired sensation, Impaired vision/preception, Decreased strength, Improper body mechanics  Visit Diagnosis: Other abnormalities of gait and mobility  Muscle weakness (generalized)     Problem List Patient Active Problem List   Diagnosis Date Noted  . Cerebral embolism with cerebral infarction 12/22/2018  . Septic shock ([place]) 12/15/2018  . Ureteral stone with hydronephrosis   . AKI (acute kidney injury) ([place])   . New onset type 2 diabetes mellitus ([place]) 02/01/2018  . Bipolar 1 disorder ([place]) 02/01/2018  . IUD (intrauterine device) in place 02/01/2018  . Obesity 02/01/2018  . Healthcare maintenance 02/01/2018  . CKD (chronic kidney disease), stage III 12/28/2017  . 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 Acute cystitis with hematuria   . Chronic interstitial nephritis 12/24/2017  . Generalized weakness 12/24/2017  . Sepsis ([place]) 12/24/2017  . Hypokalemia 12/24/2017  . Oral candidiasis 12/24/2017  . Neuropathy  involving both lower extremities 01/31/2014  . Perimenopausal 12/29/2012  . Family planning, IUD (intrauterine device) check/reinsertion/removal 12/28/2012  . Pain 12/27/2012    Willow Ora, PTA, [place] [address] [place], [place] [address] [phone] 01/25/19, 8:30 PM   Name: Pamela Carney MRN: [id number] Date of Birth: [birth year]

## 2019-01-25 NOTE — Telephone Encounter (Signed)
I called pt and was not able to LM as VM full on home #, I LMVM on her mobile that her appointment today at 1315 will be cancelled as she needs to see Dr. Leonie Man, as provider today is part of SMART study and is not allowed to see pt.  She is to call back and make appt with Dr. Leonie Man 02-07-19 at 1330 next available.

## 2019-01-26 ENCOUNTER — Encounter (HOSPITAL_COMMUNITY)
Admission: RE | Admit: 2019-01-26 | Discharge: 2019-01-26 | Disposition: A | Discharge status: Home / Self Care | Payer: 59 | Source: Ambulatory Visit | Attending: Urology | Admitting: Urology

## 2019-01-26 ENCOUNTER — Encounter: Payer: Self-pay | Admitting: Nurse Practitioner

## 2019-01-26 ENCOUNTER — Other Ambulatory Visit: Payer: Self-pay

## 2019-01-26 ENCOUNTER — Encounter (HOSPITAL_COMMUNITY): Payer: Self-pay

## 2019-01-26 DIAGNOSIS — Z7982 Long term (current) use of aspirin: Secondary | ICD-10-CM | POA: Insufficient documentation

## 2019-01-26 DIAGNOSIS — Z6841 Body Mass Index (BMI) 40.0 and over, adult: Secondary | ICD-10-CM | POA: Diagnosis not present

## 2019-01-26 DIAGNOSIS — Z794 Long term (current) use of insulin: Secondary | ICD-10-CM | POA: Insufficient documentation

## 2019-01-26 DIAGNOSIS — F319 Bipolar disorder, unspecified: Secondary | ICD-10-CM | POA: Insufficient documentation

## 2019-01-26 DIAGNOSIS — Z8673 Personal history of transient ischemic attack (TIA), and cerebral infarction without residual deficits: Secondary | ICD-10-CM | POA: Diagnosis not present

## 2019-01-26 DIAGNOSIS — F17210 Nicotine dependence, cigarettes, uncomplicated: Secondary | ICD-10-CM | POA: Insufficient documentation

## 2019-01-26 DIAGNOSIS — Z79899 Other long term (current) drug therapy: Secondary | ICD-10-CM | POA: Diagnosis not present

## 2019-01-26 DIAGNOSIS — E669 Obesity, unspecified: Secondary | ICD-10-CM | POA: Diagnosis not present

## 2019-01-26 DIAGNOSIS — N183 Chronic kidney disease, stage 3 unspecified: Secondary | ICD-10-CM | POA: Insufficient documentation

## 2019-01-26 DIAGNOSIS — E1122 Type 2 diabetes mellitus with diabetic chronic kidney disease: Secondary | ICD-10-CM | POA: Insufficient documentation

## 2019-01-26 DIAGNOSIS — G4730 Sleep apnea, unspecified: Secondary | ICD-10-CM | POA: Insufficient documentation

## 2019-01-26 DIAGNOSIS — K219 Gastro-esophageal reflux disease without esophagitis: Secondary | ICD-10-CM | POA: Diagnosis not present

## 2019-01-26 DIAGNOSIS — Z01818 Encounter for other preprocedural examination: Secondary | ICD-10-CM | POA: Insufficient documentation

## 2019-01-26 DIAGNOSIS — Z7902 Long term (current) use of antithrombotics/antiplatelets: Secondary | ICD-10-CM | POA: Diagnosis not present

## 2019-01-26 DIAGNOSIS — N201 Calculus of ureter: Secondary | ICD-10-CM | POA: Insufficient documentation

## 2019-01-26 HISTORY — DX: Depression, unspecified: F32.A

## 2019-01-26 HISTORY — DX: Cerebral infarction, unspecified: I63.9

## 2019-01-26 HISTORY — DX: Sleep apnea, unspecified: G47.30

## 2019-01-26 LAB — BASIC METABOLIC PANEL
Anion gap: 9 (ref 5–15)
BUN: 26 mg/dL — ABNORMAL HIGH (ref 6–20)
CO2: 24 mmol/L (ref 22–32)
Calcium: 9.6 mg/dL (ref 8.9–10.3)
Chloride: 109 mmol/L (ref 98–111)
Creatinine, Ser: 1.29 mg/dL — ABNORMAL HIGH (ref 0.44–1.00)
GFR calc Af Amer: 55 mL/min — ABNORMAL LOW (ref >60)
GFR calc non Af Amer: 48 mL/min — ABNORMAL LOW (ref >60)
Glucose, Bld: 144 mg/dL — ABNORMAL HIGH (ref 70–99)
Potassium: 4.6 mmol/L (ref 3.5–5.1)
Sodium: 142 mmol/L (ref 135–145)

## 2019-01-26 LAB — CBC
HCT: 41.7 % (ref 36.0–46.0)
Hemoglobin: 12.8 g/dL (ref 12.0–15.0)
MCH: 32.3 pg (ref 26.0–34.0)
MCHC: 30.7 g/dL (ref 30.0–36.0)
MCV: 105.3 fL — ABNORMAL HIGH (ref 80.0–100.0)
Platelets: 369 10*3/uL (ref 150–400)
RBC: 3.96 MIL/uL (ref 3.87–5.11)
RDW: 15.8 % — ABNORMAL HIGH (ref 11.5–15.5)
WBC: 11.3 10*3/uL — ABNORMAL HIGH (ref 4.0–10.5)
nRBC: 0 % (ref 0.0–0.2)

## 2019-01-26 LAB — GLUCOSE, CAPILLARY: Glucose-Capillary: 154 mg/dL — ABNORMAL HIGH (ref 70–99)

## 2019-01-26 MED ORDER — GENTAMICIN SULFATE 40 MG/ML IJ SOLN
5.0000 mg/kg | INTRAVENOUS | Status: AC
  Administered 2019-01-27: 09:00:00 350 mg via INTRAVENOUS
  Filled 2019-01-26: qty 8.75

## 2019-01-26 NOTE — Patient Instructions (Signed)
DUE TO COVID-19 ONLY ONE VISITOR IS ALLOWED TO COME WITH YOU AND STAY IN THE WAITING ROOM ONLY DURING PRE OP AND PROCEDURE DAY OF SURGERY. THE 1 VISITOR MAY VISIT WITH YOU AFTER SURGERY IN YOUR PRIVATE ROOM DURING VISITING HOURS ONLY!  YOU NEED TO HAVE A COVID 19 TEST ON_______ @_______ , THIS TEST MUST BE DONE BEFORE SURGERY, COME  [address] [place] , [address].  ([place]) ONCE YOUR COVID TEST IS COMPLETED, PLEASE BEGIN THE QUARANTINE INSTRUCTIONS AS OUTLINED IN YOUR HANDOUT.                Pamela Carney  01/26/2019   Your procedure is scheduled on: 01-27-19   Report to [facility] Main  Entrance   Report to admitting at        [phone] AM     Call this number if you have problems the morning of surgery [phone]    Remember: Do not eat food or drink liquids :After Midnight.  BRUSH YOUR TEETH MORNING OF SURGERY AND RINSE YOUR MOUTH OUT, NO CHEWING GUM CANDY OR MINTS.     Take these medicines the morning of surgery with A SIP OF WATER: topamax, macrobid,gabapentin   TAKE 50% OF YOUR LANTUS DOSE NIGHT BEFORE SURGERY DO NOT TAKE ANY DIABETIC MEDICATIONS DAY OF YOUR SURGERY                               You may not have any metal on your body including hair pins and              piercings  Do not wear jewelry, make-up, lotions, powders or perfumes, deodorant             Do not wear nail polish on your fingernails.  Do not shave  48 hours prior to surgery.             Do not bring valuables to the hospital. [place].  Contacts, dentures or bridgework may not be worn into surgery.      Patients discharged the day of surgery will not be allowed to drive home. IF YOU ARE HAVING SURGERY AND GOING HOME THE SAME DAY, YOU MUST HAVE AN ADULT TO DRIVE YOU HOME AND BE WITH YOU FOR 24 HOURS. YOU MAY GO HOME BY TAXI OR UBER OR ORTHERWISE, BUT AN ADULT MUST ACCOMPANY YOU HOME AND STAY WITH YOU FOR 24  HOURS.  Name and phone number of your driver:  Special Instructions: N/A              Please read over the following fact sheets you were given: _____________________________________________________________________             [facility] - Preparing for Surgery Before surgery, you can play an important role.  Because skin is not sterile, your skin needs to be as free of germs as possible.  You can reduce the number of germs on your skin by washing with CHG (chlorahexidine gluconate) soap before surgery.  CHG is an antiseptic cleaner which kills germs and bonds with the skin to continue killing germs even after washing. Please DO NOT use if you have an allergy to CHG or antibacterial soaps.  If your skin becomes reddened/irritated stop using the CHG and inform your nurse when you  arrive at Short Stay. Do not shave (including legs and underarms) for at least 48 hours prior to the first CHG shower.  You may shave your face/neck. Please follow these instructions carefully:  1.  Shower with CHG Soap the night before surgery and the  morning of Surgery.  2.  If you choose to wash your hair, wash your hair first as usual with your  normal  shampoo.  3.  After you shampoo, rinse your hair and body thoroughly to remove the  shampoo.                           4.  Use CHG as you would any other liquid soap.  You can apply chg directly  to the skin and wash                       Gently with a scrungie or clean washcloth.  5.  Apply the CHG Soap to your body ONLY FROM THE NECK DOWN.   Do not use on face/ open                           Wound or open sores. Avoid contact with eyes, ears mouth and genitals (private parts).                       Wash face,  Genitals (private parts) with your normal soap.             6.  Wash thoroughly, paying special attention to the area where your surgery  will be performed.  7.  Thoroughly rinse your body with warm water from the neck down.  8.  DO NOT shower/wash with  your normal soap after using and rinsing off  the CHG Soap.                9.  Pat yourself dry with a clean towel.            10.  Wear clean pajamas.            11.  Place clean sheets on your bed the night of your first shower and do not  sleep with pets. Day of Surgery : Do not apply any lotions/deodorants the morning of surgery.  Please wear clean clothes to the hospital/surgery center.  FAILURE TO FOLLOW THESE INSTRUCTIONS MAY RESULT IN THE CANCELLATION OF YOUR SURGERY PATIENT SIGNATURE_________________________________  NURSE SIGNATURE__________________________________  ________________________________________________________________________

## 2019-01-26 NOTE — Progress Notes (Signed)
Anesthesia Chart Review   Case: [id number] Date/Time: 01/27/19 0814   Procedures:      CYSTOSCOPY WITH RETROGRADE PYELOGRAM, URETEROSCOPY AND STENT PLACEMENT (Right ) - 43 MINS     HOLMIUM LASER APPLICATION (Right )   Anesthesia type: General   Pre-op diagnosis: RIGHT URETERAL STONE   [facility] 04 / [facility]   Surgeon: Alexis Frock, MD      DISCUSSION:53 y.o. current every day smoker (37 pack years) with h/o PONV, Bipolar disorder, migraines, CKD Stage III, CVA, sleep apnea w/cpap, DM II, right ureteral stone scheduled for above procedure 01/27/2019 with Dr. Alexis Frock.   Pt with recent admission 9/23-10/06/2018 after presentingto [facility] w/ abdominal pain and emesis found to have a 11 mm obtsructng stone.Septic shock. Post Cystoscopy, right retrograde ureteropyelogram, fluoroscopic interpretation, right double-J stent placement.  No anesthesia complications noted.    Hospital course complicated by persistent right eye discomfort (present about 1 week prior to admission to the hospital) which prompted ophthalmology consult. Recommendation for MRI brain.  Completed on 12/21/2018, showed acute/subacute ischemic CVA involving left medial occipital lobe. Seen by stroke team.  Discussed with Dr. Talbert Forest, will follow-up with ophthalmology outpatient.  She is now on Plavix and advised by Dr. Tresa Moore not to hold for procedure.    Look recorder in place to evaluate for A-fib.  Device check 01/05/2019 with no episodes noted.  Seen by cardiologist, Dr. Kirk Ruths, during admission.   Established care with PCP 01/16/2019 for diabetes management per discharge instructions.  She has a follow up in 2 months.   She has a follow up with neurologist, Dr. Antony Contras, 03/09/2019.   Follow up scheduled for bilateral carotid stenosis found on [place] during admission.    Discussed with Dr. Marcell Barlow.    Anticipate pt can proceed with planned procedure barring acute status change.   VS: BP (!) 86/58    Pulse 92   Temp 36.9 C (Oral)   Resp 18   Ht 5' 2"  (1.575 m)   Wt 101.2 kg   SpO2 97%   BMI 40.82 kg/m   PROVIDERS: [name], NP is PCP   Antony Contras, MD is Neurologist  LABS: Labs reviewed: Acceptable for surgery. (all labs ordered are listed, but only abnormal results are displayed)  Labs Reviewed  CBC - Abnormal; Notable for the following components:      Result Value   WBC 11.3 (*)    MCV 105.3 (*)    RDW 15.8 (*)    All other components within normal limits  BASIC METABOLIC PANEL - Abnormal; Notable for the following components:   Glucose, Bld 144 (*)    BUN 26 (*)    Creatinine, Ser 1.29 (*)    GFR calc non Af Amer 48 (*)    GFR calc Af Amer 55 (*)    All other components within normal limits  GLUCOSE, CAPILLARY - Abnormal; Notable for the following components:   Glucose-Capillary 154 (*)    All other components within normal limits     IMAGES: Carotid US 12/21/2018 Summary: Right Carotid: Velocities in the right ICA are consistent with a 60-79%                stenosis. See technical comments listed above.  Left Carotid: Velocities in the left ICA are consistent with a 40-59% stenosis.               See technical comments listed above.  Vertebrals:  Bilateral  vertebral arteries demonstrate antegrade flow. Subclavians: Normal flow hemodynamics were seen in bilateral subclavian              arteries.  CT Angio Head 12/21/2018 IMPRESSION: 1. 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 2. Positive also for widespread atherosclerosis and stenosis in the head and neck: - Severe Left Subclavian Artery origin stenosis. - Severe Right ICA bulb stenosis (estimated at 70-80%). - diminutive Left Vertebral Artery which occludes beyond the left PICA origin, and is moderately stenotic at its origin. - Left ICA bulb 60% stenosis. - heavily calcified ICA siphons with moderate stenosis on the  Left. - diminutive Basilar Artery with mild stenosis. 3.  Aortic Atherosclerosis (ICD10-I70.0). 4. Carious dentition.  EKG: 12/25/2018 Rate 54 bpm Sinus bradycardia with sinus arrhythmia T wave abnormality, consider lateral ischemia Inferior infarct , age undetermined Abnormal ECG Since last tracing resolving TWI precordial leads  CV: Echo TEE 12/23/2018 IMPRESSIONS    1. Left ventricular ejection fraction, by visual estimation, is 55 to 60%. The left ventricle has normal function. There is mildly increased left ventricular hypertrophy.  2. Global right ventricle has normal systolic function.The right ventricular size is not well visualized.  3. Left atrial size was normal.  4. Right atrial size was normal.  5. The mitral valve is normal in structure. No evidence of mitral valve regurgitation.  6. The tricuspid valve is normal in structure. Tricuspid valve regurgitation is trivial.  7. The aortic valve is tricuspid Aortic valve regurgitation was not visualized by color flow Doppler. Mild aortic valve sclerosis without stenosis.  8. The pulmonic valve was grossly normal. Pulmonic valve regurgitation is not visualized by color flow Doppler.  9. Moderate plaque invoving the descending aorta. 10. Normal LV systolic function; mild LVH; lipomatous hypertrophy; negative saline microcavitation study.  FINDINGS  Left Ventricle: Left ventricular ejection fraction, by visual estimation, is 55 to 60%. The left ventricle has normal function. There is mildly increased left ventricular hypertrophy. Past Medical History:  Diagnosis Date  . Abnormal Pap smear   . AKI (acute kidney injury) ([place]) 12/28/2017  . Anxiety   . Arthritis    bil knees, neck  . Bipolar 1 disorder ([place])   . Cataract    Mild  . Cholelithiasis 12/23/2017   noted on CT renal, pt unaware  . Chronic kidney disease (CKD), stage III (moderate)   . Cystitis   . Depression   . Diabetes mellitus without complication  ([place])    type 2  . Genital warts    Hx of genital  . GERD (gastroesophageal reflux disease)   . Heart murmur    childhood  . Hematuria   . History of blood transfusion   . History of ectopic pregnancy   . History of gestational diabetes   . History of kidney stones   . History of sepsis    after ectopic pregnancy  . Idiopathic peripheral neuropathy    both feet  . Leg ulcer, left ([place])   . Low back pain   . Migraines   . Obesity   . Oral candidiasis   . Pneumonia   . PONV (postoperative nausea and vomiting)    prolonged sedation  . Recurrent UTI   . 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 Sigmoid diverticulosis 12/23/2017   noted on CT renal, pt unaware  .  Sleep apnea    uses cpap  . Stroke [facility])    Cryptogenic  . Ulcer of foot ([place])    Left  . Weakness     Past Surgical History:  Procedure Laterality Date  . BUBBLE STUDY  12/23/2018   Procedure: BUBBLE STUDY;  Surgeon: Lelon Perla, MD;  Location: [facility] ENDOSCOPY;  Service: Cardiovascular;;  . CERVICAL CONE BIOPSY    . CESAREAN SECTION     x3  . CYSTOSCOPY W/ URETERAL STENT PLACEMENT Right 12/15/2018   Procedure: Cystoscopy, right retrograde ureteropyelogram, fluoroscopic interpretation, right double-J stent placement (24 cm x 6 [place]);  Surgeon: Franchot Gallo, MD;  Location: [place];  Service: Urology;  Laterality: Right;  . CYSTOSCOPY/URETEROSCOPY/HOLMIUM LASER/STENT PLACEMENT Right 02/04/2018   Procedure: CYSTOSCOPY/URETEROSCOPY/RETROGRADE PYELOGRAM/HOLMIUM LASER/STENT PLACEMENT;  Surgeon: Alexis Frock, MD;  [facility];  Service: Urology;  Laterality: Right;  . CYSTOSCOPY/URETEROSCOPY/HOLMIUM LASER/STENT PLACEMENT Right 02/07/2018   Procedure: CYSTOSCOPY LEFT STENT EXCHANGE;  Surgeon: Alexis Frock, MD;  [facility];  Service: Urology;  Laterality: Right;  . DILATION AND CURETTAGE OF UTERUS    . ECTOPIC  PREGNANCY SURGERY    . IR NEPHROSTOMY PLACEMENT LEFT  12/25/2017  . LOOP RECORDER INSERTION N/A 12/23/2018   Procedure: LOOP RECORDER INSERTION;  Surgeon: Constance Haw, MD;  Location: [place] CV LAB;  Service: Cardiovascular;  Laterality: N/A;  . NEPHROLITHOTOMY Left 02/04/2018   Procedure: NEPHROLITHOTOMY PERCUTANEOUS;  Surgeon: Alexis Frock, MD;  [facility];  Service: Urology;  Laterality: Left;  3 HRS  . NEPHROLITHOTOMY Left 02/07/2018   Procedure: SECOND LOOK NEPHROLITHOTOMY PERCUTANEOUS;  Surgeon: Alexis Frock, MD;  [facility];  Service: Urology;  Laterality: Left;  2 HRS  . OVARIAN CYST REMOVAL    . TEE WITHOUT CARDIOVERSION N/A 12/23/2018   Procedure: TRANSESOPHAGEAL ECHOCARDIOGRAM (TEE);  Surgeon: Lelon Perla, MD;  Location: [facility] ENDOSCOPY;  Service: Cardiovascular;  Laterality: N/A;  . UNILATERAL SALPINGECTOMY     Pt unsure of which fallopian tube was removed  . WISDOM TOOTH EXTRACTION      MEDICATIONS: . acetaminophen (TYLENOL) 500 MG tablet  . aspirin 81 MG EC tablet  . atorvastatin (LIPITOR) 80 MG tablet  . AZO CRANBERRY GUMMIES PO  . Blood Glucose Monitoring Suppl (ONETOUCH VERIO) w/Device KIT  . clopidogrel (PLAVIX) 75 MG tablet  . gabapentin (NEURONTIN) 300 MG capsule  . glucose blood (ONETOUCH VERIO) test strip  . indapamide (LOZOL) 2.5 MG tablet  . Insulin Glargine (LANTUS) 100 UNIT/ML Solostar Pen  . Insulin Pen Needle (PEN NEEDLES) 31G X 8 MM MISC  . ketorolac (TORADOL) 10 MG tablet  . metFORMIN (GLUCOPHAGE) 500 MG tablet  . Multiple Vitamins-Minerals (ONE-A-DAY WOMENS 50+ ADVANTAGE) TABS  . nitrofurantoin, macrocrystal-monohydrate, (MACROBID) 100 MG capsule  . ONE TOUCH LANCETS MISC  . senna-docusate (SENOKOT-S) 8.6-50 MG tablet  . topiramate (TOPAMAX) 25 MG tablet   No current facility-administered medications for this encounter.    Derrill Memo ON 01/27/2019] gentamicin (GARAMYCIN) 350 mg in dextrose 5 % 100 mL IVPB    Maia Plan [facility] Pre-Surgical Testing [phone] 01/26/19  4:39 PM

## 2019-01-26 NOTE — Anesthesia Preprocedure Evaluation (Addendum)
Anesthesia Evaluation  Patient identified by MRN, date of birth, ID band Patient awake    Reviewed: Allergy & Precautions, NPO status , Patient's Chart, lab work & pertinent test results  History of Anesthesia Complications (+) PONV  Airway Mallampati: II  TM Distance: >3 FB Neck ROM: Full    Dental  (+) Edentulous Upper, Poor Dentition   Pulmonary sleep apnea , Current Smoker and Patient abstained from smoking.,    breath sounds clear to auscultation       Cardiovascular negative cardio ROS   Rhythm:Regular Rate:Normal     Neuro/Psych  Headaches, PSYCHIATRIC DISORDERS Anxiety Depression Bipolar Disorder  Neuromuscular disease CVA    GI/Hepatic Neg liver ROS, GERD  ,  Endo/Other  diabetes  Renal/GU Renal Insufficiency and CRFRenal disease     Musculoskeletal  (+) Arthritis ,   Abdominal (+) + obese,   Peds  Hematology negative hematology ROS (+)   Anesthesia Other Findings   Reproductive/Obstetrics                           Anesthesia Physical Anesthesia Plan  ASA: III  Anesthesia Plan: General   Post-op Pain Management:    Induction: Intravenous  PONV Risk Score and Plan: 3 and Ondansetron, Dexamethasone and Midazolam  Airway Management Planned: LMA  Additional Equipment: None  Intra-op Plan:   Post-operative Plan: Extubation in OR  Informed Consent: I have reviewed the patients History and Physical, chart, labs and discussed the procedure including the risks, benefits and alternatives for the proposed anesthesia with the patient or authorized representative who has indicated his/her understanding and acceptance.       Plan Discussed with: CRNA  Anesthesia Plan Comments: (See PAT note 01/26/2019, Konrad Felix, PA-C)      Anesthesia Quick Evaluation

## 2019-01-26 NOTE — Progress Notes (Signed)
LVM for pt. To bring Cpap mask and tubing for surgery.

## 2019-01-26 NOTE — Progress Notes (Signed)
PCP - Geryl Rankins Cardiologist - Dr. Curt Bears loop recorder Neuro- Dr. Leonie Man first appt Dec.  Chest x-ray - 11-24-18 epic EKG - 12-25-18 epic Stress Test -  ECHO - 11-25-18 epic Cardiac Cath -   Sleep Study -  CPAP - wears cpap most every night  Fasting Blood Sugar - 110 Checks Blood Sugar _1-2____ times a day  Blood Thinner Instructions:Plavix do not stop for procedure per Dr. Tresa Jaleya Pebley Aspirin Instructions:81mg  not stopped for procedure per Dr. Tresa Kindred Reidinger Last Dose:  Anesthesia review: Stroke 12-14-18 , OSA on CPAP, hgb A1c 8.6 12-15-18 eoic  , BP low at preop 88-60 / 72/49   Patient denies shortness of breath, fever, cough and chest pain at PAT appointment   NONE   Complains of loss of right peripheral vision loss right eye due to stroke and balance issues . Has occasional dizzines and headaches   Patient verbalized understanding of instructions that were given to them at the PAT appointment. Patient was also instructed that they will need to review over the PAT instructions again at home before surgery.

## 2019-01-26 NOTE — Progress Notes (Addendum)
LVM with [facility] about pt. On Plavix with no instructions.Spoke with Selita at [facility] [employer] she said pt. Was instructed to stay on Plavix and asa for procedure.

## 2019-01-27 ENCOUNTER — Ambulatory Visit (HOSPITAL_COMMUNITY): Payer: 59

## 2019-01-27 ENCOUNTER — Ambulatory Visit (HOSPITAL_COMMUNITY)
Admission: RE | Admit: 2019-01-27 | Discharge: 2019-01-27 | Disposition: A | Discharge status: Home / Self Care | Payer: 59 | Attending: Urology | Admitting: Urology

## 2019-01-27 ENCOUNTER — Ambulatory Visit (HOSPITAL_COMMUNITY): Payer: 59 | Admitting: Physician Assistant

## 2019-01-27 ENCOUNTER — Ambulatory Visit: Payer: 59 | Admitting: Physical Therapy

## 2019-01-27 ENCOUNTER — Encounter (HOSPITAL_COMMUNITY)
Admission: RE | Disposition: A | Discharge status: Home / Self Care | Payer: Self-pay | Source: Home / Self Care | Attending: Urology

## 2019-01-27 ENCOUNTER — Telehealth: Payer: Self-pay | Admitting: Nurse Practitioner

## 2019-01-27 ENCOUNTER — Ambulatory Visit (HOSPITAL_COMMUNITY): Payer: 59 | Admitting: Anesthesiology

## 2019-01-27 ENCOUNTER — Encounter (HOSPITAL_COMMUNITY): Payer: Self-pay | Admitting: Anesthesiology

## 2019-01-27 DIAGNOSIS — M170 Bilateral primary osteoarthritis of knee: Secondary | ICD-10-CM | POA: Diagnosis not present

## 2019-01-27 DIAGNOSIS — K219 Gastro-esophageal reflux disease without esophagitis: Secondary | ICD-10-CM | POA: Insufficient documentation

## 2019-01-27 DIAGNOSIS — Z91048 Other nonmedicinal substance allergy status: Secondary | ICD-10-CM | POA: Insufficient documentation

## 2019-01-27 DIAGNOSIS — Z818 Family history of other mental and behavioral disorders: Secondary | ICD-10-CM | POA: Insufficient documentation

## 2019-01-27 DIAGNOSIS — Z809 Family history of malignant neoplasm, unspecified: Secondary | ICD-10-CM | POA: Insufficient documentation

## 2019-01-27 DIAGNOSIS — Z833 Family history of diabetes mellitus: Secondary | ICD-10-CM | POA: Diagnosis not present

## 2019-01-27 DIAGNOSIS — N183 Chronic kidney disease, stage 3 unspecified: Secondary | ICD-10-CM | POA: Insufficient documentation

## 2019-01-27 DIAGNOSIS — Z811 Family history of alcohol abuse and dependence: Secondary | ICD-10-CM | POA: Insufficient documentation

## 2019-01-27 DIAGNOSIS — Z825 Family history of asthma and other chronic lower respiratory diseases: Secondary | ICD-10-CM | POA: Insufficient documentation

## 2019-01-27 DIAGNOSIS — E1142 Type 2 diabetes mellitus with diabetic polyneuropathy: Secondary | ICD-10-CM | POA: Insufficient documentation

## 2019-01-27 DIAGNOSIS — Z79899 Other long term (current) drug therapy: Secondary | ICD-10-CM | POA: Insufficient documentation

## 2019-01-27 DIAGNOSIS — F319 Bipolar disorder, unspecified: Secondary | ICD-10-CM | POA: Insufficient documentation

## 2019-01-27 DIAGNOSIS — Z808 Family history of malignant neoplasm of other organs or systems: Secondary | ICD-10-CM | POA: Insufficient documentation

## 2019-01-27 DIAGNOSIS — Z87442 Personal history of urinary calculi: Secondary | ICD-10-CM | POA: Insufficient documentation

## 2019-01-27 DIAGNOSIS — Z882 Allergy status to sulfonamides status: Secondary | ICD-10-CM | POA: Insufficient documentation

## 2019-01-27 DIAGNOSIS — Z7982 Long term (current) use of aspirin: Secondary | ICD-10-CM | POA: Insufficient documentation

## 2019-01-27 DIAGNOSIS — F17210 Nicotine dependence, cigarettes, uncomplicated: Secondary | ICD-10-CM | POA: Insufficient documentation

## 2019-01-27 DIAGNOSIS — I129 Hypertensive chronic kidney disease with stage 1 through stage 4 chronic kidney disease, or unspecified chronic kidney disease: Secondary | ICD-10-CM | POA: Diagnosis not present

## 2019-01-27 DIAGNOSIS — Z6841 Body Mass Index (BMI) 40.0 and over, adult: Secondary | ICD-10-CM | POA: Insufficient documentation

## 2019-01-27 DIAGNOSIS — Z87440 Personal history of urinary (tract) infections: Secondary | ICD-10-CM | POA: Insufficient documentation

## 2019-01-27 DIAGNOSIS — N201 Calculus of ureter: Secondary | ICD-10-CM | POA: Diagnosis present

## 2019-01-27 DIAGNOSIS — Z794 Long term (current) use of insulin: Secondary | ICD-10-CM | POA: Diagnosis not present

## 2019-01-27 DIAGNOSIS — N132 Hydronephrosis with renal and ureteral calculous obstruction: Secondary | ICD-10-CM | POA: Insufficient documentation

## 2019-01-27 DIAGNOSIS — G43909 Migraine, unspecified, not intractable, without status migrainosus: Secondary | ICD-10-CM | POA: Diagnosis not present

## 2019-01-27 DIAGNOSIS — G4730 Sleep apnea, unspecified: Secondary | ICD-10-CM | POA: Insufficient documentation

## 2019-01-27 DIAGNOSIS — R319 Hematuria, unspecified: Secondary | ICD-10-CM | POA: Insufficient documentation

## 2019-01-27 DIAGNOSIS — Z791 Long term (current) use of non-steroidal anti-inflammatories (NSAID): Secondary | ICD-10-CM | POA: Insufficient documentation

## 2019-01-27 DIAGNOSIS — F419 Anxiety disorder, unspecified: Secondary | ICD-10-CM | POA: Insufficient documentation

## 2019-01-27 DIAGNOSIS — Z888 Allergy status to other drugs, medicaments and biological substances status: Secondary | ICD-10-CM | POA: Insufficient documentation

## 2019-01-27 DIAGNOSIS — Z8673 Personal history of transient ischemic attack (TIA), and cerebral infarction without residual deficits: Secondary | ICD-10-CM | POA: Diagnosis not present

## 2019-01-27 DIAGNOSIS — Z8249 Family history of ischemic heart disease and other diseases of the circulatory system: Secondary | ICD-10-CM | POA: Insufficient documentation

## 2019-01-27 DIAGNOSIS — E1122 Type 2 diabetes mellitus with diabetic chronic kidney disease: Secondary | ICD-10-CM | POA: Diagnosis not present

## 2019-01-27 DIAGNOSIS — Z91040 Latex allergy status: Secondary | ICD-10-CM | POA: Insufficient documentation

## 2019-01-27 HISTORY — PX: CYSTOSCOPY WITH RETROGRADE PYELOGRAM, URETEROSCOPY AND STENT PLACEMENT: SHX5789

## 2019-01-27 LAB — GLUCOSE, CAPILLARY
Glucose-Capillary: 131 mg/dL — ABNORMAL HIGH (ref 70–99)
Glucose-Capillary: 155 mg/dL — ABNORMAL HIGH (ref 70–99)

## 2019-01-27 SURGERY — CYSTOURETEROSCOPY, WITH RETROGRADE PYELOGRAM AND STENT INSERTION
Anesthesia: General | Laterality: Right

## 2019-01-27 MED ORDER — SODIUM CHLORIDE 0.9 % IR SOLN
Status: DC | PRN
  Administered 2019-01-27: 6000 mL via INTRAVESICAL

## 2019-01-27 MED ORDER — MIDAZOLAM HCL 2 MG/2ML IJ SOLN
INTRAMUSCULAR | Status: AC
  Filled 2019-01-27: qty 2

## 2019-01-27 MED ORDER — LIDOCAINE 2% (20 MG/ML) 5 ML SYRINGE
INTRAMUSCULAR | Status: DC | PRN
  Administered 2019-01-27: 60 mg via INTRAVENOUS

## 2019-01-27 MED ORDER — SODIUM CHLORIDE 0.9 % IV SOLN
2.0000 g | INTRAVENOUS | Status: AC
  Administered 2019-01-27: 2 g via INTRAVENOUS
  Filled 2019-01-27: qty 2

## 2019-01-27 MED ORDER — PROPOFOL 10 MG/ML IV BOLUS
INTRAVENOUS | Status: DC | PRN
  Administered 2019-01-27: 130 mg via INTRAVENOUS

## 2019-01-27 MED ORDER — FENTANYL CITRATE (PF) 250 MCG/5ML IJ SOLN
INTRAMUSCULAR | Status: DC | PRN
  Administered 2019-01-27: 25 ug via INTRAVENOUS
  Administered 2019-01-27: 50 ug via INTRAVENOUS

## 2019-01-27 MED ORDER — ONDANSETRON HCL 4 MG/2ML IJ SOLN
INTRAMUSCULAR | Status: AC
  Filled 2019-01-27: qty 2

## 2019-01-27 MED ORDER — MIDAZOLAM HCL 5 MG/5ML IJ SOLN
INTRAMUSCULAR | Status: DC | PRN
  Administered 2019-01-27: 2 mg via INTRAVENOUS

## 2019-01-27 MED ORDER — PROPOFOL 10 MG/ML IV BOLUS
INTRAVENOUS | Status: AC
  Filled 2019-01-27: qty 20

## 2019-01-27 MED ORDER — LACTATED RINGERS IV SOLN
INTRAVENOUS | Status: DC
  Administered 2019-01-27: 08:00:00 via INTRAVENOUS

## 2019-01-27 MED ORDER — ONDANSETRON HCL 4 MG/2ML IJ SOLN
INTRAMUSCULAR | Status: DC | PRN
  Administered 2019-01-27: 4 mg via INTRAVENOUS

## 2019-01-27 MED ORDER — KETOROLAC TROMETHAMINE 10 MG PO TABS: 10.0000 mg | ORAL_TABLET | Freq: Three times a day (TID) | ORAL | 0 refills | Status: DC | PRN

## 2019-01-27 MED ORDER — FENTANYL CITRATE (PF) 250 MCG/5ML IJ SOLN
INTRAMUSCULAR | Status: AC
  Filled 2019-01-27: qty 5

## 2019-01-27 MED ORDER — PHENYLEPHRINE HCL-NACL 10-0.9 MG/250ML-% IV SOLN
INTRAVENOUS | Status: AC
  Filled 2019-01-27: qty 250

## 2019-01-27 MED ORDER — DEXAMETHASONE SODIUM PHOSPHATE 4 MG/ML IJ SOLN
INTRAMUSCULAR | Status: DC | PRN
  Administered 2019-01-27: 10 mg via INTRAVENOUS

## 2019-01-27 MED ORDER — ACETAMINOPHEN 160 MG/5ML PO SOLN: 325.0000 mg | Freq: Once | ORAL | Status: DC | PRN

## 2019-01-27 MED ORDER — HYDROMORPHONE HCL 1 MG/ML IJ SOLN
0.2500 mg | INTRAMUSCULAR | Status: DC | PRN
  Administered 2019-01-27 (×2): 0.5 mg via INTRAVENOUS

## 2019-01-27 MED ORDER — ACETAMINOPHEN 10 MG/ML IV SOLN
INTRAVENOUS | Status: AC
  Filled 2019-01-27: qty 100

## 2019-01-27 MED ORDER — LIDOCAINE 2% (20 MG/ML) 5 ML SYRINGE
INTRAMUSCULAR | Status: AC
  Filled 2019-01-27: qty 5

## 2019-01-27 MED ORDER — DEXAMETHASONE SODIUM PHOSPHATE 10 MG/ML IJ SOLN
INTRAMUSCULAR | Status: AC
  Filled 2019-01-27: qty 1

## 2019-01-27 MED ORDER — FENTANYL CITRATE (PF) 100 MCG/2ML IJ SOLN
INTRAMUSCULAR | Status: AC
  Filled 2019-01-27: qty 2

## 2019-01-27 MED ORDER — SENNOSIDES-DOCUSATE SODIUM 8.6-50 MG PO TABS: 1.0000 | ORAL_TABLET | Freq: Two times a day (BID) | ORAL | 0 refills | Status: DC

## 2019-01-27 MED ORDER — LACTATED RINGERS IV SOLN: INTRAVENOUS | Status: DC

## 2019-01-27 MED ORDER — MEPERIDINE HCL 50 MG/ML IJ SOLN: 6.2500 mg | INTRAMUSCULAR | Status: DC | PRN

## 2019-01-27 MED ORDER — HYDROMORPHONE HCL 1 MG/ML IJ SOLN
INTRAMUSCULAR | Status: AC
  Administered 2019-01-27: 0.5 mg via INTRAVENOUS
  Filled 2019-01-27: qty 1

## 2019-01-27 MED ORDER — PROMETHAZINE HCL 25 MG/ML IJ SOLN: 6.2500 mg | INTRAMUSCULAR | Status: DC | PRN

## 2019-01-27 MED ORDER — NITROFURANTOIN MONOHYD MACRO 100 MG PO CAPS: 100.0000 mg | ORAL_CAPSULE | Freq: Two times a day (BID) | ORAL | 0 refills | Status: AC

## 2019-01-27 MED ORDER — ACETAMINOPHEN 325 MG PO TABS: 325.0000 mg | ORAL_TABLET | Freq: Once | ORAL | Status: DC | PRN

## 2019-01-27 MED ORDER — PHENYLEPHRINE HCL (PRESSORS) 10 MG/ML IV SOLN
INTRAVENOUS | Status: DC | PRN
  Administered 2019-01-27 (×2): 40 ug via INTRAVENOUS

## 2019-01-27 MED ORDER — ACETAMINOPHEN 10 MG/ML IV SOLN
1000.0000 mg | Freq: Once | INTRAVENOUS | Status: DC | PRN
  Administered 2019-01-27: 1000 mg via INTRAVENOUS

## 2019-01-27 MED ORDER — TRAMADOL HCL 50 MG PO TABS: 50.0000 mg | ORAL_TABLET | Freq: Four times a day (QID) | ORAL | 0 refills | Status: DC | PRN

## 2019-01-27 MED ORDER — IOHEXOL 300 MG/ML  SOLN
INTRAMUSCULAR | Status: DC | PRN
  Administered 2019-01-27: 15 mL

## 2019-01-27 MED ORDER — 0.9 % SODIUM CHLORIDE (POUR BTL) OPTIME
TOPICAL | Status: DC | PRN
  Administered 2019-01-27: 1000 mL

## 2019-01-27 SURGICAL SUPPLY — 24 items
BAG URO CATCHER STRL LF (MISCELLANEOUS) ×2 IMPLANT
BASKET LASER NITINOL 1.9FR (BASKET) ×2 IMPLANT
CATH INTERMIT  6FR 70CM (CATHETERS) ×2 IMPLANT
CLOTH BEACON ORANGE TIMEOUT ST (SAFETY) ×2 IMPLANT
COVER SURGICAL LIGHT HANDLE (MISCELLANEOUS) ×2 IMPLANT
COVER WAND RF STERILE (DRAPES) IMPLANT
EXTRACTOR STONE 1.7FRX115CM (UROLOGICAL SUPPLIES) IMPLANT
FIBER LASER FLEXIVA 1000 (UROLOGICAL SUPPLIES) IMPLANT
FIBER LASER FLEXIVA 365 (UROLOGICAL SUPPLIES) IMPLANT
FIBER LASER FLEXIVA 550 (UROLOGICAL SUPPLIES) IMPLANT
FIBER LASER TRAC TIP (UROLOGICAL SUPPLIES) IMPLANT
GLOVE BIOGEL M STRL SZ7.5 (GLOVE) ×2 IMPLANT
GOWN STRL REUS W/TWL LRG LVL3 (GOWN DISPOSABLE) ×2 IMPLANT
GUIDEWIRE ANG ZIPWIRE 038X150 (WIRE) ×2 IMPLANT
GUIDEWIRE STR DUAL SENSOR (WIRE) ×2 IMPLANT
KIT TURNOVER KIT A (KITS) IMPLANT
MANIFOLD NEPTUNE II (INSTRUMENTS) ×2 IMPLANT
PACK CYSTO (CUSTOM PROCEDURE TRAY) ×2 IMPLANT
SHEATH URETERAL 12FRX28CM (UROLOGICAL SUPPLIES) ×2 IMPLANT
SHEATH URETERAL 12FRX35CM (MISCELLANEOUS) IMPLANT
STENT POLARIS 5FRX22 (STENTS) ×2 IMPLANT
TUBE FEEDING 8FR 16IN STR KANG (MISCELLANEOUS) ×2 IMPLANT
TUBING CONNECTING 10 (TUBING) ×2 IMPLANT
TUBING UROLOGY SET (TUBING) ×2 IMPLANT

## 2019-01-27 NOTE — Transfer of Care (Signed)
Immediate Anesthesia Transfer of Care Note  Patient: Pamela Carney  Procedure(s) Performed: CYSTOSCOPY WITH RETROGRADE PYELOGRAM, URETEROSCOPY AND STENT PLACEMENT; BASKET STONE RETRIEVAL (Right )  Patient Location: PACU  Anesthesia Type:General  Level of Consciousness: drowsy, patient cooperative and responds to stimulation  Airway & Oxygen Therapy: Patient Spontanous Breathing and Patient connected to face mask oxygen  Post-op Assessment: Report given to RN and Post -op Vital signs reviewed and stable  Post vital signs: Reviewed and stable  Last Vitals:  Vitals Value Taken Time  BP 98/81 01/27/19 0930  Temp    Pulse 71 01/27/19 0931  Resp 21 01/27/19 0931  SpO2 100 % 01/27/19 0931  Vitals shown include unvalidated device data.  Last Pain:  Vitals:   01/27/19 0722  TempSrc:   PainSc: 5       Patients Stated Pain Goal: 4 ([id number])  Complications: No apparent anesthesia complications

## 2019-01-27 NOTE — H&P (Signed)
Pamela Carney is an 53 y.o. female.    Chief Complaint: Pre-Op RIGHT Ureteroscopic Stone Maniulation  HPI:   1 - Recurrent Urolithiasis -  01/2018 - 2 stage Rt ureteroscopy / Lt PCNL 01/2018 for Rt renal and left partial staghorn stones.  12/2018 - Rt 47m fusiform distal ureteral / Rt pap tip x few / Lt parenchymal stones only by ER CT on eval sepsis. Rt 6x24 JJ stent placed.   2 - Bacteriuria, H/o Urosepsis - morbidly obese diabetic with bacteruriia x many. E.Cloli/Enterococcus sepsis 2020 at time of actue stone sens nitro, bactrim, gent.   3 - Hematuria - New hematuria 2019. Large bilateral stones as per above. Eval otherise unremarkbale with CT and bilatearl ureterosopy / cysto.   4 - Medical Stone Disease / Renal Leak Hypercalciuria / Oliguria -  Eval 2019: BMP, PTH, Urate- mild high urate 6.6; Composition - 75% CaOx / 25% CaPO4; 24 Hr Urines - Low vol 1.2L, High Ca (313) ==> indapamide 2.5 + inc hydration    PMH sig for bipolar, migraine, morbid obesity, DM2 (A1c 8s), CVA. PCP is KMina MarbleNP.    Today "Pamela Carney is seen to proceed with RIGHT ureteroscopic stone manipulation. She has been on macrobid pre-op accordign to most recetn CX data.     Past Medical History:  Diagnosis Date  . Abnormal Pap smear   . [place] ([place] kidney [place]) ([place] 12/28/2017  . Anxiety   . Arthritis    bil knees, neck  . Bipolar 1 disorder ([place]   . Cataract    Mild  . Cholelithiasis 12/23/2017   noted on CT renal, pt unaware  . Chronic kidney disease (CKD), stage III (moderate)   . Cystitis   . Depression   . Diabetes mellitus without complication ([place]    type 2  . Genital warts    Hx of genital  . GERD (gastroesophageal reflux disease)   . Heart murmur    childhood  . Hematuria   . History of blood transfusion   . History of ectopic pregnancy   . History of gestational diabetes   . History of kidney stones   . History of sepsis    after ectopic pregnancy  . Idiopathic  peripheral neuropathy    both feet  . Leg ulcer, left ([place]   . Low back pain   . Migraines   . Obesity   . Oral candidiasis   . Pneumonia   . PONV (postoperative nausea and vomiting)    prolonged sedation  . Recurrent UTI   . 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 Sigmoid diverticulosis 12/23/2017   noted on CT renal, pt unaware  . Sleep apnea    uses cpap  . Stroke ([facility]    Cryptogenic  . Ulcer of foot ([place]    Left  . Weakness     Past Surgical History:  Procedure Laterality Date  . BUBBLE STUDY  12/23/2018   Procedure: BUBBLE STUDY;  Surgeon: CLelon Perla MD;  [facility];  Service: Cardiovascular;;  . CERVICAL CONE BIOPSY    . CESAREAN SECTION     x3  . CYSTOSCOPY W/ URETERAL STENT PLACEMENT Right 12/15/2018   Procedure: Cystoscopy, right retrograde ureteropyelogram, fluoroscopic interpretation, right double-J stent placement (24 cm x 6 [place];  Surgeon: DFranchot Gallo MD;  [facility]  Service: Urology;  Laterality: Right;  . CYSTOSCOPY/URETEROSCOPY/HOLMIUM  LASER/STENT PLACEMENT Right 02/04/2018   Procedure: CYSTOSCOPY/URETEROSCOPY/RETROGRADE PYELOGRAM/HOLMIUM LASER/STENT PLACEMENT;  Surgeon: Alexis Frock, MD;  [facility];  Service: Urology;  Laterality: Right;  . CYSTOSCOPY/URETEROSCOPY/HOLMIUM LASER/STENT PLACEMENT Right 02/07/2018   Procedure: CYSTOSCOPY LEFT STENT EXCHANGE;  Surgeon: Alexis Frock, MD;  [facility];  Service: Urology;  Laterality: Right;  . DILATION AND CURETTAGE OF UTERUS    . ECTOPIC PREGNANCY SURGERY    . IR NEPHROSTOMY PLACEMENT LEFT  12/25/2017  . LOOP RECORDER INSERTION N/A 12/23/2018   Procedure: LOOP RECORDER INSERTION;  Surgeon: Constance Haw, MD;  Location: [place] CV LAB;  Service: Cardiovascular;  Laterality: N/A;  . NEPHROLITHOTOMY Left 02/04/2018   Procedure: NEPHROLITHOTOMY PERCUTANEOUS;   Surgeon: Alexis Frock, MD;  [facility];  Service: Urology;  Laterality: Left;  3 HRS  . NEPHROLITHOTOMY Left 02/07/2018   Procedure: SECOND LOOK NEPHROLITHOTOMY PERCUTANEOUS;  Surgeon: Alexis Frock, MD;  [facility];  Service: Urology;  Laterality: Left;  2 HRS  . OVARIAN CYST REMOVAL    . TEE WITHOUT CARDIOVERSION N/A 12/23/2018   Procedure: TRANSESOPHAGEAL ECHOCARDIOGRAM (TEE);  Surgeon: Lelon Perla, MD;  Location: [facility] ENDOSCOPY;  Service: Cardiovascular;  Laterality: N/A;  . UNILATERAL SALPINGECTOMY     Pt unsure of which fallopian tube was removed  . WISDOM TOOTH EXTRACTION      Family History  Problem Relation Age of Onset  . Diabetes Paternal Grandfather   . COPD Paternal Grandfather   . Heart disease Paternal Grandfather   . Cancer Paternal Grandfather        bone  . Cancer Paternal Grandmother   . Heart disease Paternal Grandmother   . Cancer Father   . Hypertension Father   . Heart attack Father   . Alcohol abuse Father   . Depression Father   . Heart failure Mother   . Diabetes Mother   . Heart disease Mother   . Hyperlipidemia Mother   . Hypertension Mother   . Heart attack Mother   . Peripheral vascular disease Mother   . Depression Mother   . COPD Mother   . Hypertension Sister   . Heart attack Sister   . Stroke Maternal Grandmother    Social History:  reports that she has been smoking cigarettes. She has a 37.00 pack-year smoking history. She has never used smokeless tobacco. She reports that she does not drink alcohol or use drugs.  Allergies:  Allergies  Allergen Reactions  . Sulfa Antibiotics Hives and Swelling    Swelling site not recalled  . Latex Rash  . Tape Rash    Not tolerated well    Medications Prior to Admission  Medication Sig Dispense Refill  . acetaminophen (TYLENOL) 500 MG tablet Take 1,000 mg by mouth 2 (two) times daily as needed for moderate pain or headache.    Marland Kitchen aspirin 81 MG EC tablet Take 1 tablet (81 mg  total) by mouth daily. 30 tablet 2  . atorvastatin (LIPITOR) 80 MG tablet Take 1 tablet (80 mg total) by mouth daily at 6 PM. 90 tablet 2  . AZO CRANBERRY GUMMIES PO Take 2 tablets by mouth daily.    . clopidogrel (PLAVIX) 75 MG tablet Take 1 tablet (75 mg total) by mouth daily. 30 tablet 2  . gabapentin (NEURONTIN) 300 MG capsule Take 1 capsule (300 mg total) by mouth 2 (two) times daily. 180 capsule 0  . indapamide (LOZOL) 2.5 MG tablet Take 2.5 mg by mouth every evening.    Marland Kitchen  Insulin Glargine (LANTUS) 100 UNIT/ML Solostar Pen Inject 15 Units into the skin at bedtime. 15 mL prn  . ketorolac (TORADOL) 10 MG tablet Take 10 mg by mouth every 6 (six) hours as needed for moderate pain.    . metFORMIN (GLUCOPHAGE) 500 MG tablet Take 1 tablet (500 mg total) by mouth 2 (two) times daily with a meal. 180 tablet 0  . Multiple Vitamins-Minerals (ONE-A-DAY WOMENS 50+ ADVANTAGE) TABS Take 1 tablet by mouth daily.    . nitrofurantoin, macrocrystal-monohydrate, (MACROBID) 100 MG capsule Take 100 mg by mouth 2 (two) times daily.    Marland Kitchen senna-docusate (SENOKOT-S) 8.6-50 MG tablet Take 2 tablets by mouth at bedtime as needed for mild constipation. 30 tablet 2  . topiramate (TOPAMAX) 25 MG tablet TAKE 1 TABLET BY MOUTH DAILY, MAY INCREASE BY ONE TABLET WEEKLY FOR A MAX OF 100MG (Patient taking differently: Take 25 mg by mouth 2 (two) times daily. ) 120 tablet 2  . Blood Glucose Monitoring Suppl (ONETOUCH VERIO) w/Device KIT Use to check blood sugars every morning fasting and 2 hours after largest meal 1 kit 0  . glucose blood (ONETOUCH VERIO) test strip Use to check blood sugars every morning fasting and 2 hours after largest meal 200 each 3  . Insulin Pen Needle (PEN NEEDLES) 31G X 8 MM MISC 1 each by Does not apply route daily. 100 each 3  . ONE TOUCH LANCETS MISC Use to check blood sugars every morning fasting and 2 hours after largest meal 200 each 3    Results for orders placed or performed during the hospital  encounter of 01/26/19 (from the past 48 hour(s))  Glucose, capillary     Status: Abnormal   Collection Time: 01/26/19  1:18 PM  Result Value Ref Range   Glucose-Capillary 154 (H) 70 - 99 mg/dL  CBC     Status: Abnormal   Collection Time: 01/26/19  2:43 PM  Result Value Ref Range   WBC 11.3 (H) 4.0 - 10.5 K/uL   RBC 3.96 3.87 - 5.11 MIL/uL   Hemoglobin 12.8 12.0 - 15.0 g/dL   HCT 41.7 36.0 - 46.0 %   MCV 105.3 (H) 80.0 - 100.0 fL   MCH 32.3 26.0 - 34.0 pg   MCHC 30.7 30.0 - 36.0 g/dL   RDW 15.8 (H) 11.5 - 15.5 %   Platelets 369 150 - 400 K/uL   nRBC 0.0 0.0 - 0.2 %    Comment: Performed at [facility], [place] [address], [place], [place] [address]  Basic metabolic panel     Status: Abnormal   Collection Time: 01/26/19  2:43 PM  Result Value Ref Range   Sodium 142 135 - 145 mmol/L   Potassium 4.6 3.5 - 5.1 mmol/L   Chloride 109 98 - 111 mmol/L   CO2 24 22 - 32 mmol/L   Glucose, Bld 144 (H) 70 - 99 mg/dL   BUN 26 (H) 6 - 20 mg/dL   Creatinine, Ser 1.29 (H) 0.44 - 1.00 mg/dL   Calcium 9.6 8.9 - 10.3 mg/dL   GFR calc non Af Amer 48 (L) >60 mL/min   GFR calc Af Amer 55 (L) >60 mL/min   Anion gap 9 5 - 15    Comment: Performed at [facility] [address], [place], [place] [address]   No results found.  Review of Systems  Constitutional: Negative for chills and fever.  Genitourinary: Positive for urgency.  All other systems reviewed and  are negative.   Blood pressure 121/77, pulse 81, temperature 98.7 F (37.1 C), temperature source Oral, resp. rate 12, SpO2 98 %. Physical Exam  Constitutional: She appears well-developed.  HENT:  Head: Normocephalic.  Eyes: Pupils are equal, round, and reactive to light.  Neck: Normal range of motion.  Cardiovascular: Normal rate.  Respiratory: Effort normal.  GI: Soft.  Stable large truncal obesity  Genitourinary:    Genitourinary Comments: No CVAT at present.    Neurological: She is alert.   Skin: Skin is warm.  Psychiatric: She has a normal mood and affect.     Assessment/Plan  Proceed as planned with cysto, RIGHT retrograde, ureteroscopy / laser lithotripsy / stent exchave for recurrent stone. Risks, benefits, alternaitves, expected peri-op course discussed previously and reiterated today.   Alexis Frock, MD 01/27/2019, 7:01 AM

## 2019-01-27 NOTE — Anesthesia Procedure Notes (Signed)
Procedure Name: LMA Insertion Date/Time: 01/27/2019 8:48 AM Performed by: Glory Buff, CRNA Pre-anesthesia Checklist: Emergency Drugs available, Patient identified, Suction available and Patient being monitored Patient Re-evaluated:Patient Re-evaluated prior to induction Oxygen Delivery Method: Circle system utilized Preoxygenation: Pre-oxygenation with 100% oxygen Induction Type: IV induction LMA: LMA inserted LMA Size: 4.0 Number of attempts: 1 Placement Confirmation: positive ETCO2 Tube secured with: Tape Dental Injury: Teeth and Oropharynx as per pre-operative assessment

## 2019-01-27 NOTE — Anesthesia Postprocedure Evaluation (Signed)
Anesthesia Post Note  Patient: Pamela Carney  Procedure(s) Performed: CYSTOSCOPY WITH RETROGRADE PYELOGRAM, URETEROSCOPY AND STENT PLACEMENT; BASKET STONE RETRIEVAL (Right )     Patient location during evaluation: PACU Anesthesia Type: General Level of consciousness: awake and alert Pain management: pain level controlled Vital Signs Assessment: post-procedure vital signs reviewed and stable Respiratory status: spontaneous breathing, nonlabored ventilation, respiratory function stable and patient connected to nasal cannula oxygen Cardiovascular status: blood pressure returned to baseline and stable Postop Assessment: no apparent nausea or vomiting Anesthetic complications: no    Last Vitals:  Vitals:   01/27/19 1015 01/27/19 1030  BP: 109/85 128/87  Pulse: 62 70  Resp: 20 13  Temp:    SpO2: 96% 93%    Last Pain:  Vitals:   01/27/19 1030  TempSrc:   PainSc: 0-No pain                 Effie Berkshire

## 2019-01-27 NOTE — Progress Notes (Signed)
Pt discharged in NAD, VSS, pt voided and able to tolerate PO fluids with no nausea and vomiting. Pt discharged with instructions and sent by wheelchair home with [place].

## 2019-01-27 NOTE — Brief Op Note (Signed)
01/27/2019  9:16 AM  PATIENT:  Pamela Carney  53 y.o. female  PRE-OPERATIVE DIAGNOSIS:  RIGHT URETERAL STONE  POST-OPERATIVE DIAGNOSIS:  RIGHT URETERAL STONE  PROCEDURE:  Procedure(s) with comments: CYSTOSCOPY WITH RETROGRADE PYELOGRAM, URETEROSCOPY AND STENT PLACEMENT; BASKET STONE RETRIEVAL (Right) - 75 MINS  SURGEON:  Surgeon(s) and Role:    * Alexis Frock, MD - Primary  PHYSICIAN ASSISTANT:   ASSISTANTS: none   ANESTHESIA:   general  EBL:  minimal   BLOOD ADMINISTERED:none  DRAINS: none   LOCAL MEDICATIONS USED:  NONE  SPECIMEN:  Source of Specimen:  Rt ureteral / renal stone fragments  DISPOSITION OF SPECIMEN:  discard  COUNTS:  YES  TOURNIQUET:  * No tourniquets in log *  DICTATION: .Other Dictation: Dictation Number [id number]  PLAN OF CARE: Discharge to home after PACU  PATIENT DISPOSITION:  PACU - hemodynamically stable.   Delay start of Pharmacological VTE agent (>24hrs) due to surgical blood loss or risk of bleeding: yes

## 2019-01-27 NOTE — Discharge Instructions (Signed)
1 - You may have urinary urgency (bladder spasms) and bloody urine on / off with stent in place. This is normal.  2 - Remove tethered stent on Monday morning at home by pulling on string, then blue-white plastic tubing, and discarding. Office is open Monday if problems arise.   3 -  Call MD or go to ER for fever >102, severe pain / nausea / vomiting not relieved by medications, or acute change in medical status

## 2019-01-28 ENCOUNTER — Encounter (HOSPITAL_COMMUNITY): Payer: Self-pay | Admitting: Urology

## 2019-01-30 NOTE — Op Note (Signed)
NAME: Pamela Carney, Pamela Carney MEDICAL RECORD [id number] ACCOUNT [id number] DATE OF BIRTH:1965-05-13 FACILITY: [facility] LOCATION: [facility] PHYSICIAN:Braddock Servellon, MD  OPERATIVE REPORT  DATE OF PROCEDURE:  01/27/2019  PREOPERATIVE DIAGNOSIS:  Right ureteral and renal stones, recurrent urosepsis.  PROCEDURE: 1.  Cystoscopy, right retrograde pyelogram, interpretation. 2.  Exchange of right ureteral stent 5 x 22 Polaris with tether. 3.  Right ureteroscopy, basketing of stones.  ESTIMATED BLOOD LOSS:  Nil.  COMPLICATIONS:  None.  SPECIMENS:  Right renal and ureteral stones, infectious type for discard.  FINDINGS: 1.  Minimal volume of right intraluminal stone, predominant right lower mid, infectious, very soft. 2.  Complete resolution of all accessible stone fragments larger than 1/3 mm following extraction. 3.  Successful placement of right ureteral stent proximal renal pelvis, distal end urinary bladder.  INDICATIONS:  The patient is a pleasant, but quite comorbid 53 year old lady with history of recurrent nephrolithiasis.  She is compliant with medical therapy.  She has also had recurrent infections and diabetes.  She was found on workup of recurrent  urosepsis to have a right distal ureteral stone as well as some right intrarenal stones.  She underwent urgent stenting at that time approximately a month ago.  She has cleared her systemic infectious parameters and now presents for ureteroscopy today  with goal of right side stone free.  Informed consent was obtained and placed in medical record.  DESCRIPTION OF PROCEDURE:  Patient being identified, procedure being right ureteral stent placement confirmed.  Procedure timeout was performed.  Antibiotics administered.  General LMA anesthesia induced.  The patient was placed into a low lithotomy  position, sterile field was created prepping the vagina, introitus, and proximal thighs using iodine.  Cystourethroscopy was performed using 21-French  rigid cystoscope with offset lens.  Inspection of the urinary bladder revealed distal end of the stent  in situ.  It was quite encrusted.  It was removed and set aside for discard.  The open-ended catheter was placed.  Right pyelogram was obtained.  Right pyelogram reveals a single right ureter single system right kidney.  No filling defects noted.  An 8-French G-tube in urinary bladder for pressure release.  Semirigid ureteroscopy performed the distal 4/5 right ureter  alongside a sensor working wire.  There were no additional abnormalities or intraluminal stones seen other than some slight mucosal edema and erythema in the distal ureter consistent with site of likely prior stone impaction.  There was no stone at this  time.  It is felt that this may have been removed with the stent.  As the goal today was to verify stone free, the semirigid scope was exchanged for 12-14 short length ureteral access sheath to the level of the proximal ureter using continuous  fluoroscopic guidance and flexible digital ureteroscopy performed or the proximal ureter and systematic cystoscopy, right kidney.  There was a small volume intrarenal stones, right lower mid.  This appeared to be simple basketing alone and escape  basket was used to grasp these fragments.  These were incredibly soft and essentially disintegrated with basket manipulation consistent with likely infectious etiology.  The fragments were irrigated down the access sheath and flushed away.  Following this,  complete resolution of all accessible stone fragments larger than 1/3 mm.  Access sheath using continuous vision, no mucosal abnormalities were found.  Given the patient's predisposition towards infection, it was felt that brief interval stenting with  tethered stent be warranted.  As such, a new 5 x 22 Polaris-type stent was placed  remaining safety wire using fluoroscopic guidance.  Good proximal and distal plane were noted.  The procedure terminated.   The patient tolerated the procedure well.  No  immediate complications.  The patient taken to postanesthesia care in stable condition.  Plan for discharge home.  CN/NUANCE  D:01/27/2019 T:01/27/2019 JOB:[id number]

## 2019-01-31 ENCOUNTER — Ambulatory Visit: Payer: 59

## 2019-02-02 ENCOUNTER — Ambulatory Visit (INDEPENDENT_AMBULATORY_CARE_PROVIDER_SITE_OTHER): Payer: 59 | Admitting: *Deleted

## 2019-02-02 ENCOUNTER — Ambulatory Visit: Payer: 59

## 2019-02-02 DIAGNOSIS — I639 Cerebral infarction, unspecified: Secondary | ICD-10-CM

## 2019-02-02 LAB — CUP PACEART REMOTE DEVICE CHECK
Date Time Interrogation Session: 20201112211620
Implantable Pulse Generator Implant Date: 20201002

## 2019-02-07 ENCOUNTER — Other Ambulatory Visit: Payer: Self-pay

## 2019-02-07 ENCOUNTER — Ambulatory Visit: Payer: 59

## 2019-02-07 VITALS — BP 102/58

## 2019-02-07 DIAGNOSIS — R2689 Other abnormalities of gait and mobility: Secondary | ICD-10-CM | POA: Diagnosis not present

## 2019-02-07 DIAGNOSIS — M6281 Muscle weakness (generalized): Secondary | ICD-10-CM

## 2019-02-07 NOTE — Therapy (Signed)
[facility] [address] [place], [place], [address] Phone: [phone]   Fax:  [phone]  Physical Therapy Treatment  Patient Details  Name: Pamela Carney MRN: [id number] Date of Birth: July 12, 1965 Referring Provider (PT): Frann Rider   Encounter Date: 02/07/2019  PT End of Session - 02/07/19 1152    Visit Number  3    Number of Visits  11    Date for PT Re-Evaluation  03/14/19    PT Start Time  1146    PT Stop Time  1227    PT Time Calculation (min)  41 min    Activity Tolerance  Patient tolerated treatment well    Behavior During Therapy  [facility] for tasks assessed/performed       Past Medical History:  Diagnosis Date  . Abnormal Pap smear   . AKI (acute kidney injury) ([place]) 12/28/2017  . Anxiety   . Arthritis    bil knees, neck  . Bipolar 1 disorder ([place])   . Cataract    Mild  . Cholelithiasis 12/23/2017   noted on CT renal, pt unaware  . Chronic kidney disease (CKD), stage III (moderate)   . Cystitis   . Depression   . Diabetes mellitus without complication ([place])    type 2  . Genital warts    Hx of genital  . GERD (gastroesophageal reflux disease)   . Heart murmur    childhood  . Hematuria   . History of blood transfusion   . History of ectopic pregnancy   . History of gestational diabetes   . History of kidney stones   . History of sepsis    after ectopic pregnancy  . Idiopathic peripheral neuropathy    both feet  . Leg ulcer, left ([place])   . Low back pain   . Migraines   . Obesity   . Oral candidiasis   . Pneumonia   . PONV (postoperative nausea and vomiting)    prolonged sedation  . Recurrent UTI   . 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 Sigmoid diverticulosis 12/23/2017   noted on CT renal, pt unaware  . Sleep apnea    uses cpap  . Stroke [facility])    Cryptogenic  . Ulcer of foot ([place])    Left  . Weakness     Past Surgical History:  Procedure Laterality Date  . BUBBLE STUDY  12/23/2018   Procedure: BUBBLE STUDY;  Surgeon: Lelon Perla, MD;  Location: [facility] ENDOSCOPY;  Service: Cardiovascular;;  . CERVICAL CONE BIOPSY    . CESAREAN SECTION     x3  . CYSTOSCOPY W/ URETERAL STENT PLACEMENT Right 12/15/2018   Procedure: Cystoscopy, right retrograde ureteropyelogram, fluoroscopic interpretation, right double-J stent placement (24 cm x 6 [place]);  Surgeon: Franchot Gallo, MD;  Location: [facility];  Service: Urology;  Laterality: Right;  . CYSTOSCOPY WITH RETROGRADE PYELOGRAM, URETEROSCOPY AND STENT PLACEMENT Right 01/27/2019   Procedure: CYSTOSCOPY WITH RETROGRADE PYELOGRAM, URETEROSCOPY AND STENT PLACEMENT; BASKET STONE RETRIEVAL;  Surgeon: Alexis Frock, MD;  Location: [facility];  Service: Urology;  Laterality: Right;  75 MINS  . CYSTOSCOPY/URETEROSCOPY/HOLMIUM LASER/STENT PLACEMENT Right 02/04/2018   Procedure: CYSTOSCOPY/URETEROSCOPY/RETROGRADE PYELOGRAM/HOLMIUM LASER/STENT PLACEMENT;  Surgeon: Alexis Frock, MD;  Location: [facility];  Service: Urology;  Laterality: Right;  . CYSTOSCOPY/URETEROSCOPY/HOLMIUM LASER/STENT PLACEMENT Right 02/07/2018   Procedure: CYSTOSCOPY LEFT STENT EXCHANGE;  Surgeon: Alexis Frock, MD;  Location:  [facility];  Service: Urology;  Laterality: Right;  . DILATION AND CURETTAGE OF UTERUS    . ECTOPIC PREGNANCY SURGERY    . IR NEPHROSTOMY PLACEMENT LEFT  12/25/2017  . LOOP RECORDER INSERTION N/A 12/23/2018   Procedure: LOOP RECORDER INSERTION;  Surgeon: Constance Haw, MD;  Location: [facility] CV LAB;  Service: Cardiovascular;  Laterality: N/A;  . NEPHROLITHOTOMY Left 02/04/2018   Procedure: NEPHROLITHOTOMY PERCUTANEOUS;  Surgeon: Alexis Frock, MD;  Location: [facility];  Service: Urology;  Laterality: Left;  3 HRS  . NEPHROLITHOTOMY Left 02/07/2018   Procedure: SECOND LOOK NEPHROLITHOTOMY PERCUTANEOUS;  Surgeon: Alexis Frock, MD;  Location: [facility];  Service: Urology;  Laterality: Left;  2 HRS  . OVARIAN CYST REMOVAL    . TEE WITHOUT CARDIOVERSION N/A 12/23/2018   Procedure: TRANSESOPHAGEAL ECHOCARDIOGRAM (TEE);  Surgeon: Lelon Perla, MD;  Location: [facility] ENDOSCOPY;  Service: Cardiovascular;  Laterality: N/A;  . UNILATERAL SALPINGECTOMY     Pt unsure of which fallopian tube was removed  . WISDOM TOOTH EXTRACTION      Vitals:   02/07/19 1148  BP: (!) 102/58    Subjective Assessment - 02/07/19 1148    Subjective  Pt reports that she continues to have the headaches and nausea every day. She thought she had appointment with neurologist today but turns out it is not until December 17th. Pt reports that she took some of her daughter's zofran for the nausea. Pt reports that she is almost out of her strips and needles and needs to call her PCP. PT advised to check on nausea medication at that time as well.    Pertinent History  PMHx of Bipolar Type 1, DM, CKD stage III, HTN, OSA, cartotid stenosis, polyneuropathy    Patient Stated Goals  Pt wants to be able to walk better.    Currently in Pain?  Yes    Pain Score  8     Pain Location  Foot    Pain Orientation  Right;Left    Pain Descriptors / Indicators  Aching;Burning    Pain Type  Chronic pain;Neuropathic pain    Pain Onset  More than a month ago    Pain Relieving Factors  reports upping her neurotin to 3x/day due to pain, tylenol    Pain Score  6    Pain Location  Head    Pain Descriptors / Indicators  Throbbing   hears heart beat in ears   Pain Type  Chronic pain    Pain Frequency  Constant                       [facility] Adult PT Treatment/Exercise - 02/07/19 1153      Ambulation/Gait   Ambulation/Gait  Yes    Ambulation/Gait Assistance  5: Supervision    Ambulation/Gait Assistance Details  Pt staggers to side at times with gait. Cues to scan environment    Ambulation Distance (Feet)  345 Feet    Assistive device  None    Ambulation Surface  Level;Indoor     Gait velocity  0.52m/s    Stairs  Yes    Stairs Assistance  5: Supervision    Stairs Assistance Details (indicate cue type and reason)  Pt reports being very scared with descending steps    Stair Management Technique  Two rails;Step to pattern    Number of Stairs  4    Gait Comments  Pt ambulated in clinic while looking for 6 cones  to help with scanning environement. Missed 3/6 first lap around and needed some cuing to look to the right.       Neuro Re-ed    Neuro Re-ed Details   Step-ups on 4" step with 1 UE support with left leg x 10 forward and then x 10 lateral with bilateral UE support. Pt very nervous at first to only hold with 1 hand but improved as went on. Pt stood on airex without UE support x 30 sec. Attempted to add head turns but difficult time isolating head turns moving whole body and needing to touch. Also reported pain and weakness in legs. CGA with activities on airex for safety.             PT Education - 02/07/19 1318    Education Details  Pt was educated on importance of trying to increase water intake.    Person(s) Educated  Patient    Methods  Explanation    Comprehension  Verbalized understanding       PT Short Term Goals - 02/07/19 1204      PT SHORT TERM GOAL #1   Title  Pt will be independent with strengthening and balance HEP to continue gains on own.    Baseline  Currently does not have home program    Time  4    Period  Weeks    Status  New    Target Date  02/12/19      PT SHORT TERM GOAL #2   Title  Berg Balance, gait speed and stair assessments will be performed and goals updated after performed    Baseline  Merrilee Jansky was performed last visit and gait speed and stairs assessed today.    Time  4    Period  Weeks    Status  Achieved    Target Date  02/12/19      PT SHORT TERM GOAL #3   Title  Pt will increase Berg score by 4 points of more for improved balance and decreased fall risk.    Baseline  39/56 on 01/24/2019    Time  4    Period   Weeks    Status  New      PT SHORT TERM GOAL #4   Title  Pt will increase gait speed by >0.13m/s for improved ambulation safety.    Baseline  0.67m/s on 02/06/2019    Period  Weeks    Status  New    Target Date  02/12/19      PT SHORT TERM GOAL #5   Title  Pt will ambulate up/down 3 steps without rail supervision for improved ability to enter home.    Baseline  currently needs assistance of family    Time  4    Period  Weeks    Status  New    Target Date  02/12/19        PT Long Term Goals - 01/13/19 1232      PT LONG TERM GOAL #1   Title  Pt will ambulate >500' on varied surfaces independently with strategies to compensate for right field cut for improved community access.    Baseline  75' supervision    Time  6    Period  Weeks    Status  New    Target Date  02/25/19      PT LONG TERM GOAL #2   Title  Pt will ascend/descend a flight of steps with left rail mod I for improved  access in home.    Baseline  currently not going upstairs.    Time  6    Period  Weeks    Status  New    Target Date  02/25/19      PT LONG TERM GOAL #3   Title  Pt will be able to maintain condition 2 and 4 on modified CTSIB for 10 sec for improved balance utilizing vestibular system more to help compensate for decreased vision.    Time  6    Period  Weeks    Status  New    Target Date  02/25/19            Plan - 02/07/19 1311    Clinical Impression Statement  Pt was fearful with negotiating steps today but was able to perform 4 steps with bilateral rails supervision. PT worked on trying to improve confidence with starting with smaller step with less UE support and patient did better than she expected. Pt still needs reminders to scan environment so she doesn't miss things on the right side due to field but.    Personal Factors and Comorbidities  Comorbidity 3+    Comorbidities  Bipolar Type 1, DM, CKD stage III, HTN, OSA, cartotid stenosis, polyneuropathy    Examination-Activity  Limitations  Stairs;Locomotion Level;Squat    Examination-Participation Restrictions  Community Activity;Driving;Meal Prep    Stability/Clinical Decision Making  Evolving/Moderate complexity    Rehab Potential  Good    PT Frequency  2x / week   followed by 1x/week for 2 weeks   PT Duration  4 weeks   followed by 1x/week for 2 weeks   PT Treatment/Interventions  ADLs/Self Care Home Management;Gait training;Stair training;Functional mobility training;Therapeutic activities;Therapeutic exercise;Balance training;Patient/family education;Neuromuscular re-education;Visual/perceptual remediation/compensation    PT Next Visit Plan  Gait training with focus on scanning due to right homonymous hemianopsia. Monitor BP as was running low at visit. Standing balance and step training. Add visits to schedule    Consulted and Agree with Plan of Care  Patient       Patient will benefit from skilled therapeutic intervention in order to improve the following deficits and impairments:  Abnormal gait, Decreased activity tolerance, Decreased balance, Dizziness, Impaired sensation, Impaired vision/preception, Decreased strength, Improper body mechanics  Visit Diagnosis: Other abnormalities of gait and mobility  Muscle weakness (generalized)     Problem List Patient Active Problem List   Diagnosis Date Noted  . Cerebral embolism with cerebral infarction 12/22/2018  . Septic shock ([place]) 12/15/2018  . Ureteral stone with hydronephrosis   . AKI (acute kidney injury) ([place])   . New onset type 2 diabetes mellitus ([place]) 02/01/2018  . Bipolar 1 disorder ([place]) 02/01/2018  . IUD (intrauterine device) in place 02/01/2018  . Obesity 02/01/2018  . Healthcare maintenance 02/01/2018  . CKD (chronic kidney disease), stage III 12/28/2017  . 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 Acute cystitis with hematuria   . Chronic interstitial nephritis 12/24/2017  . Generalized weakness 12/24/2017  . Sepsis ([place]) 12/24/2017  .  Hypokalemia 12/24/2017  . Oral candidiasis 12/24/2017  . Neuropathy involving both lower extremities 01/31/2014  . Perimenopausal 12/29/2012  . Family planning, IUD (intrauterine device) check/reinsertion/removal 12/28/2012  . Pain 12/27/2012    Electa Sniff, PT, DPT, NCS 02/07/2019, 1:22 PM  [facility] [address] [place], [place], [address] Phone: [phone]   Fax:  [phone]  Name: Pamela Carney MRN: [id number] Date of Birth: 19-Feb-1966

## 2019-02-07 NOTE — Patient Instructions (Signed)
Access Code: [email or web address]  URL: [email or web address]  Date: 02/07/2019  Prepared by: Cherly Anderson   Exercises Sit to Stand - 5 reps - 2 sets - 1x daily - 7x weekly

## 2019-02-09 ENCOUNTER — Ambulatory Visit: Payer: 59

## 2019-02-14 ENCOUNTER — Other Ambulatory Visit: Payer: Self-pay

## 2019-02-14 ENCOUNTER — Ambulatory Visit: Payer: 59 | Admitting: Physical Therapy

## 2019-02-14 VITALS — BP 104/70

## 2019-02-14 DIAGNOSIS — R2689 Other abnormalities of gait and mobility: Secondary | ICD-10-CM | POA: Diagnosis not present

## 2019-02-14 DIAGNOSIS — M6281 Muscle weakness (generalized): Secondary | ICD-10-CM

## 2019-02-14 NOTE — Therapy (Signed)
[facility] [address] [place], [place], [address] Phone: [phone]   Fax:  [phone]  Physical Therapy Treatment  Patient Details  Name: Pamela Carney MRN: [id number] Date of Birth: 1965/08/09 Referring Provider (PT): Frann Rider   Encounter Date: 02/14/2019  PT End of Session - 02/14/19 1736    Visit Number  4    Number of Visits  11    Date for PT Re-Evaluation  03/14/19    PT Start Time  1147    PT Stop Time  1230    PT Time Calculation (min)  43 min    Activity Tolerance  Patient tolerated treatment well    Behavior During Therapy  [place] for tasks assessed/performed       Past Medical History:  Diagnosis Date  . Abnormal Pap smear   . AKI (acute kidney injury) ([place]) 12/28/2017  . Anxiety   . Arthritis    bil knees, neck  . Bipolar 1 disorder ([place])   . Cataract    Mild  . Cholelithiasis 12/23/2017   noted on CT renal, pt unaware  . Chronic kidney disease (CKD), stage III (moderate)   . Cystitis   . Depression   . Diabetes mellitus without complication ([place])    type 2  . Genital warts    Hx of genital  . GERD (gastroesophageal reflux disease)   . Heart murmur    childhood  . Hematuria   . History of blood transfusion   . History of ectopic pregnancy   . History of gestational diabetes   . History of kidney stones   . History of sepsis    after ectopic pregnancy  . Idiopathic peripheral neuropathy    both feet  . Leg ulcer, left ([place])   . Low back pain   . Migraines   . Obesity   . Oral candidiasis   . Pneumonia   . PONV (postoperative nausea and vomiting)    prolonged sedation  . Recurrent UTI   . 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 Sigmoid diverticulosis 12/23/2017   noted on CT renal, pt unaware  . Sleep apnea    uses cpap  . Stroke [facility])    Cryptogenic  . Ulcer of foot ([place])     Left  . Weakness     Past Surgical History:  Procedure Laterality Date  . BUBBLE STUDY  12/23/2018   Procedure: BUBBLE STUDY;  Surgeon: Lelon Perla, MD;  Location: [facility] ENDOSCOPY;  Service: Cardiovascular;;  . CERVICAL CONE BIOPSY    . CESAREAN SECTION     x3  . CYSTOSCOPY W/ URETERAL STENT PLACEMENT Right 12/15/2018   Procedure: Cystoscopy, right retrograde ureteropyelogram, fluoroscopic interpretation, right double-J stent placement (24 cm x 6 [place]);  Surgeon: Franchot Gallo, MD;  Location: [facility];  Service: Urology;  Laterality: Right;  . CYSTOSCOPY WITH RETROGRADE PYELOGRAM, URETEROSCOPY AND STENT PLACEMENT Right 01/27/2019   Procedure: CYSTOSCOPY WITH RETROGRADE PYELOGRAM, URETEROSCOPY AND STENT PLACEMENT; BASKET STONE RETRIEVAL;  Surgeon: Alexis Frock, MD;  Location: [facility] ORS;  Service: Urology;  Laterality: Right;  75 MINS  . CYSTOSCOPY/URETEROSCOPY/HOLMIUM LASER/STENT PLACEMENT Right 02/04/2018   Procedure: CYSTOSCOPY/URETEROSCOPY/RETROGRADE PYELOGRAM/HOLMIUM LASER/STENT PLACEMENT;  Surgeon: Alexis Frock, MD;  Location: [facility] ORS;  Service: Urology;  Laterality: Right;  . CYSTOSCOPY/URETEROSCOPY/HOLMIUM LASER/STENT PLACEMENT Right 02/07/2018   Procedure: CYSTOSCOPY LEFT STENT EXCHANGE;  Surgeon: Alexis Frock, MD;  Location: [facility] ORS;  Service: Urology;  Laterality: Right;  . DILATION AND CURETTAGE OF UTERUS    . ECTOPIC PREGNANCY SURGERY    . IR NEPHROSTOMY PLACEMENT LEFT  12/25/2017  . LOOP RECORDER INSERTION N/A 12/23/2018   Procedure: LOOP RECORDER INSERTION;  Surgeon: Constance Haw, MD;  Location: [facility] CV LAB;  Service: Cardiovascular;  Laterality: N/A;  . NEPHROLITHOTOMY Left 02/04/2018   Procedure: NEPHROLITHOTOMY PERCUTANEOUS;  Surgeon: Alexis Frock, MD;  Location: [facility] ORS;  Service: Urology;  Laterality: Left;  3 HRS  . NEPHROLITHOTOMY Left 02/07/2018   Procedure: SECOND LOOK NEPHROLITHOTOMY PERCUTANEOUS;  Surgeon: Alexis Frock, MD;  Location: [facility]  ORS;  Service: Urology;  Laterality: Left;  2 HRS  . OVARIAN CYST REMOVAL    . TEE WITHOUT CARDIOVERSION N/A 12/23/2018   Procedure: TRANSESOPHAGEAL ECHOCARDIOGRAM (TEE);  Surgeon: Lelon Perla, MD;  Location: [facility] ENDOSCOPY;  Service: Cardiovascular;  Laterality: N/A;  . UNILATERAL SALPINGECTOMY     Pt unsure of which fallopian tube was removed  . WISDOM TOOTH EXTRACTION      Vitals:   02/14/19 1729  BP: 104/70    Subjective Assessment - 02/14/19 1727    Subjective  relays she still gets dizzy and BP has been low. She says she missed some PT appointments because of transportation so she drove herself today. PT advised her not to drive until cleared by MD due to safety as she has decreased vision and cognition post CVA/septic shock.    Pertinent History  PMHx of Bipolar Type 1, DM, CKD stage III, HTN, OSA, cartotid stenosis, polyneuropathy    Patient Stated Goals  Pt wants to be able to walk better.    Pain Onset  More than a month ago                       [facility] Adult PT Treatment/Exercise - 02/14/19 0001      Ambulation/Gait   Stairs  Yes    Stairs Assistance  5: Supervision    Stairs Assistance Details (indicate cue type and reason)  needs max cues to alternate legs going up as she is safe with this but to come down first with her weaker leg (Rt leg). She needed constant reminding of this due to confusion. Performed 6 inch stairs X 4 steps with bilat UE support up/down X 5 times total    Gait Comments  gait training with both quad tip cane and quad cane, she needs max cues and demonstration for reciprocal technique and to place cane in her Lt hand with proper ht adjustment. She is very inconsistent with using proper technique and finally just had her carry cane but not put it down unless she became off balance. Ambulated 4 laps total (115 ft ea lap) needed close supervision to CGA for balance and safety.      Therapeutic Activites    Therapeutic Activities  Other  Therapeutic Activities    Other Therapeutic Activities  sit to stands X 10 reps total with cues to stand still until BP normalizes and dizziness subsides before she initiates gait             PT Education - 02/14/19 1735    Education Details  education on drinking enough water, not to drive until cleared by MD, talk to MD about her continued hypotension, proper use for stairs and cane    Person(s) Educated  Patient    Methods  Explanation;Demonstration;Verbal cues;Tactile cues  Comprehension  Verbalized understanding;Returned demonstration;Need further instruction       PT Short Term Goals - 02/07/19 1204      PT SHORT TERM GOAL #1   Title  Pt will be independent with strengthening and balance HEP to continue gains on own.    Baseline  Currently does not have home program    Time  4    Period  Weeks    Status  New    Target Date  02/12/19      PT SHORT TERM GOAL #2   Title  Berg Balance, gait speed and stair assessments will be performed and goals updated after performed    Baseline  Merrilee Jansky was performed last visit and gait speed and stairs assessed today.    Time  4    Period  Weeks    Status  Achieved    Target Date  02/12/19      PT SHORT TERM GOAL #3   Title  Pt will increase Berg score by 4 points of more for improved balance and decreased fall risk.    Baseline  39/56 on 01/24/2019    Time  4    Period  Weeks    Status  New      PT SHORT TERM GOAL #4   Title  Pt will increase gait speed by >0.38m/s for improved ambulation safety.    Baseline  0.68m/s on 02/06/2019    Period  Weeks    Status  New    Target Date  02/12/19      PT SHORT TERM GOAL #5   Title  Pt will ambulate up/down 3 steps without rail supervision for improved ability to enter home.    Baseline  currently needs assistance of family    Time  4    Period  Weeks    Status  New    Target Date  02/12/19        PT Long Term Goals - 01/13/19 1232      PT LONG TERM GOAL #1   Title  Pt will  ambulate >500' on varied surfaces independently with strategies to compensate for right field cut for improved community access.    Baseline  75' supervision    Time  6    Period  Weeks    Status  New    Target Date  02/25/19      PT LONG TERM GOAL #2   Title  Pt will ascend/descend a flight of steps with left rail mod I for improved access in home.    Baseline  currently not going upstairs.    Time  6    Period  Weeks    Status  New    Target Date  02/25/19      PT LONG TERM GOAL #3   Title  Pt will be able to maintain condition 2 and 4 on modified CTSIB for 10 sec for improved balance utilizing vestibular system more to help compensate for decreased vision.    Time  6    Period  Weeks    Status  New    Target Date  02/25/19            Plan - 02/14/19 1737    Clinical Impression Statement  Able to safely go up stairs today after max education and trianing to come down leading with her weaker Rt leg. Session focused on education and gait training with [facility] and she has somewhat inconsistent performance  with SPC. PT will continue to practice with her.    Personal Factors and Comorbidities  Comorbidity 3+    Comorbidities  Bipolar Type 1, DM, CKD stage III, HTN, OSA, cartotid stenosis, polyneuropathy    Examination-Activity Limitations  Stairs;Locomotion Level;Squat    Examination-Participation Restrictions  Community Activity;Driving;Meal Prep    Stability/Clinical Decision Making  Evolving/Moderate complexity    Rehab Potential  Good    PT Frequency  2x / week   followed by 1x/week for 2 weeks   PT Duration  4 weeks   followed by 1x/week for 2 weeks   PT Treatment/Interventions  ADLs/Self Care Home Management;Gait training;Stair training;Functional mobility training;Therapeutic activities;Therapeutic exercise;Balance training;Patient/family education;Neuromuscular re-education;Visual/perceptual remediation/compensation    PT Next Visit Plan  Gait training with focus on  scanning due to right homonymous hemianopsia. Monitor BP as was running low at visit. Standing balance and step training. Add visits to schedule    Consulted and Agree with Plan of Care  Patient       Patient will benefit from skilled therapeutic intervention in order to improve the following deficits and impairments:  Abnormal gait, Decreased activity tolerance, Decreased balance, Dizziness, Impaired sensation, Impaired vision/preception, Decreased strength, Improper body mechanics  Visit Diagnosis: Other abnormalities of gait and mobility  Muscle weakness (generalized)     Problem List Patient Active Problem List   Diagnosis Date Noted  . Cerebral embolism with cerebral infarction 12/22/2018  . Septic shock ([place]) 12/15/2018  . Ureteral stone with hydronephrosis   . AKI (acute kidney injury) ([place])   . New onset type 2 diabetes mellitus ([place]) 02/01/2018  . Bipolar 1 disorder ([place]) 02/01/2018  . IUD (intrauterine device) in place 02/01/2018  . Obesity 02/01/2018  . Healthcare maintenance 02/01/2018  . CKD (chronic kidney disease), stage III 12/28/2017  . 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 Acute cystitis with hematuria   . Chronic interstitial nephritis 12/24/2017  . Generalized weakness 12/24/2017  . Sepsis ([place]) 12/24/2017  . Hypokalemia 12/24/2017  . Oral candidiasis 12/24/2017  . Neuropathy involving both lower extremities 01/31/2014  . Perimenopausal 12/29/2012  . Family planning, IUD (intrauterine device) check/reinsertion/removal 12/28/2012  . Pain 12/27/2012    Silvestre Mesi 02/14/2019, 5:39 PM  [facility] [address] [place], [place], [address] Phone: [phone]   Fax:  [phone]  Name: Pamela Carney MRN: [id number] Date of Birth: 10-19-1965

## 2019-02-21 ENCOUNTER — Telehealth: Payer: Self-pay | Admitting: Nurse Practitioner

## 2019-02-21 ENCOUNTER — Ambulatory Visit: Payer: 59 | Attending: Internal Medicine

## 2019-02-21 DIAGNOSIS — E119 Type 2 diabetes mellitus without complications: Secondary | ICD-10-CM

## 2019-02-21 NOTE — Telephone Encounter (Signed)
Patient called saying she had not had a bowel movement for about a week. Patient states that she is cramping and needs advise form her PCP. Please f/u

## 2019-02-22 ENCOUNTER — Other Ambulatory Visit: Payer: Self-pay | Admitting: Nurse Practitioner

## 2019-02-22 DIAGNOSIS — E119 Type 2 diabetes mellitus without complications: Secondary | ICD-10-CM

## 2019-02-22 DIAGNOSIS — K5900 Constipation, unspecified: Secondary | ICD-10-CM

## 2019-02-22 DIAGNOSIS — Z1211 Encounter for screening for malignant neoplasm of colon: Secondary | ICD-10-CM

## 2019-02-22 NOTE — Telephone Encounter (Signed)
Pt. Stated she is taking her stool softener once a day and it is not helping.  Pt. She does not eat much and drink a lot of water. CMA inform patient to drink water and she can purchase OTC laxative to help her to have a bowl movement.  Pt. Was informed to go Urgent Care or Emergency Department if her discomfort become worst. Pt. Understood.  Will route to PCP for additional advise if needed.

## 2019-02-22 NOTE — Telephone Encounter (Signed)
Medication is prescribed for twice a day. I have referred her to gastroenterology as she is also overdue for colonoscopy and she has also been referred to ophthalmology. They will both call her to schedule. She should be drinking at least 80 oz of water per day

## 2019-02-23 MED ORDER — ONETOUCH VERIO VI STRP: ORAL_STRIP | 3 refills | Status: AC

## 2019-02-23 NOTE — Telephone Encounter (Signed)
Patient called back. CMA spoke to patient and inform on PCP advising. Pt. Understood and is aware of referrals.  Pt. Request if she can get test strip refills.  CMA will refill test strip.

## 2019-02-23 NOTE — Telephone Encounter (Signed)
CMA attempt to reach patient to inform on PCP advising. No answer and LVM for call back.

## 2019-02-24 ENCOUNTER — Encounter: Payer: Self-pay | Admitting: Gastroenterology

## 2019-02-24 NOTE — Progress Notes (Signed)
Carelink Summary Report / Loop Recorder 

## 2019-03-02 ENCOUNTER — Telehealth: Payer: Self-pay

## 2019-03-02 ENCOUNTER — Ambulatory Visit: Payer: 59 | Admitting: Physical Therapy

## 2019-03-02 NOTE — Telephone Encounter (Signed)
Pt . requested if PCP can prescribe her Zofran.

## 2019-03-03 ENCOUNTER — Ambulatory Visit (INDEPENDENT_AMBULATORY_CARE_PROVIDER_SITE_OTHER): Payer: 59 | Admitting: *Deleted

## 2019-03-03 DIAGNOSIS — I6349 Cerebral infarction due to embolism of other cerebral artery: Secondary | ICD-10-CM

## 2019-03-04 ENCOUNTER — Other Ambulatory Visit: Payer: Self-pay | Admitting: Nurse Practitioner

## 2019-03-04 MED ORDER — ONDANSETRON HCL 4 MG PO TABS: 4.0000 mg | ORAL_TABLET | Freq: Three times a day (TID) | ORAL | 1 refills | Status: DC | PRN

## 2019-03-04 NOTE — Telephone Encounter (Signed)
Sent. Thanks.   

## 2019-03-06 LAB — CUP PACEART REMOTE DEVICE CHECK
Date Time Interrogation Session: 20201211172434
Implantable Pulse Generator Implant Date: 20201002

## 2019-03-07 ENCOUNTER — Telehealth: Payer: Self-pay

## 2019-03-07 ENCOUNTER — Ambulatory Visit: Payer: 59

## 2019-03-07 NOTE — Telephone Encounter (Signed)
I tried to call pts listed number.Vm is full and message cannot be left. DR.Sethi will not be in the office to do in office visit but is doing mychart video visit .

## 2019-03-07 NOTE — Telephone Encounter (Signed)
Left vm for Pamela Carney at [phone] that pts appt will be change to mychart video visit due to MD not in the office this week. PT does not have a mychart account and will need to set her account her before visit. Tried to call pts phone twice and unable to leave vm.

## 2019-03-07 NOTE — Telephone Encounter (Signed)
RN tried to call pt on 03/02/2019 but could not leave message. VM was full.

## 2019-03-07 NOTE — Telephone Encounter (Signed)
Pt no show to visit today. Attempted to call but voicemail full. Called contact Leonie Green and left message on voicemail about PT trying to contact patient and to notify of appointment for Thursday. Let them know that if we do not hear back and misses that appointment PT will discharge chart. Cherly Anderson, PT, DPT, NCS

## 2019-03-08 NOTE — Telephone Encounter (Signed)
I called pt that her appt will change to mychart video visit with Dr.Sethi. I stated he is not in the office this week during in office visit. He is only do Radiographer, therapeutic visits. I explain the mychart process for the appt. Pt stated she will reschedule. I stated the only available date was Dec 29. PT r/s and was explain the COVID 19 process. Mask required, no kids, temperature taken, and one visitor allowed. Pt verbalized understanding.

## 2019-03-09 ENCOUNTER — Ambulatory Visit: Payer: 59

## 2019-03-09 ENCOUNTER — Ambulatory Visit: Payer: 59 | Admitting: Neurology

## 2019-03-09 NOTE — Therapy (Signed)
[facility] [address] [place], [place], [address] Phone: [phone]   Fax:  [phone]  Patient Details  Name: Pamela Carney MRN: [id number] Date of Birth: Mar 26, 1965 Referring Provider:  No ref. provider found  Encounter Date: 03/09/2019 last seen 02/14/2019 in clinic  PHYSICAL THERAPY DISCHARGE SUMMARY  Visits from Start of Care: 4  Current functional level related to goals / functional outcomes: Unknown as patient has not returned to clinic since 02/14/2019 with multiple no-shows. PT did reach out to check on patient with no return call. See goals below for last documented progress.   Remaining deficits:    Education / Equipment: Pt has been educated on initial HEP. Has also educated on importance of scanning environment with field cut.  Plan: Patient agrees to discharge.  Patient goals were not met. Patient is being discharged due to not returning since the last visit.  ?????          PT Short Term Goals - 02/07/19 1204      PT SHORT TERM GOAL #1   Title  Pt will be independent with strengthening and balance HEP to continue gains on own.    Baseline  Currently does not have home program    Time  4    Period  Weeks    Status  New    Target Date  02/12/19      PT SHORT TERM GOAL #2   Title  Berg Balance, gait speed and stair assessments will be performed and goals updated after performed    Baseline  Merrilee Jansky was performed last visit and gait speed and stairs assessed today.    Time  4    Period  Weeks    Status  Achieved    Target Date  02/12/19      PT SHORT TERM GOAL #3   Title  Pt will increase Berg score by 4 points of more for improved balance and decreased fall risk.    Baseline  39/56 on 01/24/2019    Time  4    Period  Weeks    Status  New      PT SHORT TERM GOAL #4   Title  Pt will increase gait speed by >0.3ms for improved ambulation safety.    Baseline  0.625m on 02/06/2019    Period  Weeks    Status  New    Target Date  02/12/19      PT SHORT TERM GOAL #5   Title  Pt will ambulate up/down 3 steps without rail supervision for improved ability to enter home.    Baseline  currently needs assistance of family    Time  4    Period  Weeks    Status  New    Target Date  02/12/19      PT Long Term Goals - 01/13/19 1232      PT LONG TERM GOAL #1   Title  Pt will ambulate >500' on varied surfaces independently with strategies to compensate for right field cut for improved community access.    Baseline  75' supervision    Time  6    Period  Weeks    Status  New    Target Date  02/25/19      PT LONG TERM GOAL #2   Title  Pt will ascend/descend a flight of steps with left rail mod I for improved access in home.    Baseline  currently not going  upstairs.    Time  6    Period  Weeks    Status  New    Target Date  02/25/19      PT LONG TERM GOAL #3   Title  Pt will be able to maintain condition 2 and 4 on modified CTSIB for 10 sec for improved balance utilizing vestibular system more to help compensate for decreased vision.    Time  6    Period  Weeks    Status  New    Target Date  02/25/19        Electa Sniff, PT, DPT, NCS 03/09/2019, 4:02 PM  [facility] [address] [place], [place], [address] Phone: [phone]   Fax:  [phone]

## 2019-03-09 NOTE — Telephone Encounter (Signed)
Attempt to reach patient to inform medication has been sent. No answer and unable to LVM due to mailbox is full.

## 2019-03-10 ENCOUNTER — Encounter: Payer: Self-pay | Admitting: Nurse Practitioner

## 2019-03-10 ENCOUNTER — Other Ambulatory Visit: Payer: Self-pay

## 2019-03-10 ENCOUNTER — Ambulatory Visit: Payer: 59 | Attending: Nurse Practitioner | Admitting: Nurse Practitioner

## 2019-03-10 VITALS — BP 88/65 | HR 89 | Temp 98.7°F | Ht 63.0 in | Wt 219.0 lb

## 2019-03-10 DIAGNOSIS — Z794 Long term (current) use of insulin: Secondary | ICD-10-CM

## 2019-03-10 DIAGNOSIS — E1140 Type 2 diabetes mellitus with diabetic neuropathy, unspecified: Secondary | ICD-10-CM

## 2019-03-10 DIAGNOSIS — E1165 Type 2 diabetes mellitus with hyperglycemia: Secondary | ICD-10-CM

## 2019-03-10 DIAGNOSIS — Z1231 Encounter for screening mammogram for malignant neoplasm of breast: Secondary | ICD-10-CM

## 2019-03-10 DIAGNOSIS — F17210 Nicotine dependence, cigarettes, uncomplicated: Secondary | ICD-10-CM

## 2019-03-10 DIAGNOSIS — Z124 Encounter for screening for malignant neoplasm of cervix: Secondary | ICD-10-CM

## 2019-03-10 DIAGNOSIS — IMO0002 Reserved for concepts with insufficient information to code with codable children: Secondary | ICD-10-CM

## 2019-03-10 DIAGNOSIS — F17200 Nicotine dependence, unspecified, uncomplicated: Secondary | ICD-10-CM

## 2019-03-10 LAB — POCT GLYCOSYLATED HEMOGLOBIN (HGB A1C): Hemoglobin A1C: 6.7 % — AB (ref 4.0–5.6)

## 2019-03-10 LAB — GLUCOSE, POCT (MANUAL RESULT ENTRY): POC Glucose: 197 mg/dl — AB (ref 70–99)

## 2019-03-10 MED ORDER — NICOTINE 21 MG/24HR TD PT24: 21.0000 mg | MEDICATED_PATCH | Freq: Every day | TRANSDERMAL | 0 refills | Status: DC

## 2019-03-10 MED ORDER — NICOTINE 14 MG/24HR TD PT24: 14.0000 mg | MEDICATED_PATCH | Freq: Every day | TRANSDERMAL | 0 refills | Status: DC

## 2019-03-10 MED ORDER — NICOTINE 7 MG/24HR TD PT24: 7.0000 mg | MEDICATED_PATCH | Freq: Every day | TRANSDERMAL | 0 refills | Status: DC

## 2019-03-10 MED ORDER — GABAPENTIN 300 MG PO CAPS: 300.0000 mg | ORAL_CAPSULE | Freq: Four times a day (QID) | ORAL | 0 refills | Status: DC

## 2019-03-10 NOTE — Progress Notes (Signed)
Assessment & Plan:  Pamela Carney was seen today for follow-up.  Diagnoses and all orders for this visit:  Uncontrolled type 2 diabetes mellitus, with long-term current use of insulin (HCC) -     Glucose (CBG) -     HgB A1c -     urine micro Continue blood sugar control as discussed in office today, low carbohydrate diet, and regular physical exercise as tolerated, 150 minutes per week (30 min each day, 5 days per week, or 50 min 3 days per week). Keep blood sugar logs with fasting goal of 90-130 mg/dl, post prandial (after you eat) less than 180.  For Hypoglycemia: BS <60 and Hyperglycemia BS >400; contact the clinic ASAP. Annual eye exams and foot exams are recommended.   Uncontrolled type 2 diabetes with neuropathy (HCC) -     gabapentin (NEURONTIN) 300 MG capsule; Take 1 capsule (300 mg total) by mouth 4 (four) times daily.  Breast cancer screening by mammogram -     Correct MAMMOGRAM; Future  Encounter for Papanicolaou smear for cervical cancer screening -     Ambulatory referral to Gynecology  Tobacco dependence Pamela Carney was counseled on the dangers of tobacco use, and was advised to quit. Reviewed strategies to maximize success, including removing cigarettes and smoking materials from environment, stress management and support of family/friends as well as pharmacological alternatives including: Wellbutrin, Chantix, Nicotine patch, Nicotine gum or lozenges. Smoking cessation support: smoking cessation hotline: 1-800-QUIT-NOW.  Smoking cessation classes are also available through [facility]. Call [phone] or visit our website at [email or web address]   A total of 3 minutes was spent on counseling for smoking cessation and Pamela Carney is not ready to quit.     Patient has been counseled on age-appropriate routine health concerns for screening and prevention. These are reviewed and up-to-date. Referrals have been placed accordingly. Immunizations are up-to-date  or declined.    Subjective:   Chief Complaint  Patient presents with  . Follow-up    Pt. is here for diabetes follow up.    HPI Pamela Carney 53 y.o. female presents to office today for follow up.   has a past medical history of Abnormal Pap smear, AKI (acute kidney injury) ([place]) (12/28/2017), Anxiety, Arthritis, Bipolar 1 disorder ([place]), Cataract, Cholelithiasis (12/23/2017), Chronic kidney disease (CKD), stage III (moderate), Cystitis, Depression, Diabetes mellitus without complication ([place]), Genital warts, GERD (gastroesophageal reflux disease), Heart murmur, Hematuria, History of blood transfusion, History of ectopic pregnancy, History of gestational diabetes, History of kidney stones, History of sepsis, Idiopathic peripheral neuropathy, Leg ulcer, left ([place]), Low back pain, Migraines, Obesity, Oral candidiasis, Pneumonia, PONV (postoperative nausea and vomiting), Recurrent UTI, Renal calculi (12/23/2017), Sigmoid diverticulosis (12/23/2017), Sleep apnea, Stroke ([place]), Ulcer of foot ([place]), and Weakness.   Being followed by [facility]. Taking indapamide for nephrolithiasis. This may be causing changes with her blood pressure in regard to hypotension. May need to lower dosage. Will call urology office to discuss.   Tobacco Dependence Not ready to quit. Smoking up to 1 ppd.   Essential Hypertension Blood pressure is somewhat low. She is not drinking a substantial about of liquids for hydration. Need to possibly decrease indapamide. Denies chest pain, shortness of breath, palpitations, lightheadedness, dizziness, headaches or BLE edema.  BP Readings from Last 3 Encounters:  03/21/19 90/70  03/10/19 (!) 88/65  02/14/19 104/70   DM TYPE 2 Well controlled. She is taking metformin 500 mg BID and lantus 15 units at bedtime. Hyperglycemic symptoms include peripheral  neuropathy for which she is taking gabapentin 300 mg TID. Will increase to QID for better efficacy.  Lab Results    Component Value Date   HGBA1C 6.7 (A) 03/10/2019   Dyslipidemia LDL not at goal of <70. She is taking atorvastatin 80 mg daily as prescribed. Denies any statin intolerance or myalgias.  Lab Results  Component Value Date   LDLCALC 99 12/15/2018   Headaches Taking 100 mg of topamax with no relief of headaches. She has been taking tramadol for her headaches. Apparently her pharmacist keeps filling it for her although she has no refills on this medication. She will follow up with neurology regarding this at her next office visit.     Review of Systems  Constitutional: Negative for fever, malaise/fatigue and weight loss.  HENT: Negative.  Negative for nosebleeds.   Eyes: Negative.  Negative for blurred vision, double vision and photophobia.  Respiratory: Negative.  Negative for cough and shortness of breath.   Cardiovascular: Negative.  Negative for chest pain, palpitations and leg swelling.  Gastrointestinal: Negative.  Negative for heartburn, nausea and vomiting.  Musculoskeletal: Negative.  Negative for myalgias.  Neurological: Positive for sensory change (peripheral neuropathy) and headaches. Negative for dizziness, focal weakness and seizures.  Psychiatric/Behavioral: Positive for depression. Negative for suicidal ideas. The patient is nervous/anxious.        Bipolar disorder    Past Medical History:  Diagnosis Date  . Abnormal Pap smear   . AKI (acute kidney injury) ([place]) 12/28/2017  . Anxiety   . Arthritis    bil knees, neck  . Bipolar 1 disorder ([place])   . Cataract    Mild  . Cholelithiasis 12/23/2017   noted on CT renal, pt unaware  . Chronic kidney disease (CKD), stage III (moderate)   . Cystitis   . Depression   . Diabetes mellitus without complication ([place])    type 2  . Genital warts    Hx of genital  . GERD (gastroesophageal reflux disease)   . Heart murmur    childhood  . Hematuria   . History of blood transfusion   . History of ectopic pregnancy   .  History of gestational diabetes   . History of kidney stones   . History of sepsis    after ectopic pregnancy  . Idiopathic peripheral neuropathy    both feet  . Leg ulcer, left ([place])   . Low back pain   . Migraines   . Obesity   . Oral candidiasis   . Pneumonia   . PONV (postoperative nausea and vomiting)    prolonged sedation  . Recurrent UTI   . 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 Sigmoid diverticulosis 12/23/2017   noted on CT renal, pt unaware  . Sleep apnea    uses cpap  . Stroke [facility])    Cryptogenic  . Ulcer of foot ([place])    Left  . Weakness     Past Surgical History:  Procedure Laterality Date  . BUBBLE STUDY  12/23/2018   Procedure: BUBBLE STUDY;  Surgeon: Lelon Perla, MD;  Location: [facility] ENDOSCOPY;  Service: Cardiovascular;;  . CERVICAL CONE BIOPSY    . CESAREAN SECTION     x3  . CYSTOSCOPY W/ URETERAL STENT PLACEMENT Right 12/15/2018   Procedure: Cystoscopy, right retrograde ureteropyelogram, fluoroscopic interpretation, right double-J stent placement (24 cm x 6 [place]);  Surgeon: Franchot Gallo, MD;  Location: [facility] OR;  Service: Urology;  Laterality: Right;  . CYSTOSCOPY WITH RETROGRADE PYELOGRAM, URETEROSCOPY AND STENT PLACEMENT Right 01/27/2019   Procedure: CYSTOSCOPY WITH RETROGRADE PYELOGRAM, URETEROSCOPY AND STENT PLACEMENT; BASKET STONE RETRIEVAL;  Surgeon: Alexis Frock, MD;  Location: [facility] ORS;  Service: Urology;  Laterality: Right;  75 MINS  . CYSTOSCOPY/URETEROSCOPY/HOLMIUM LASER/STENT PLACEMENT Right 02/04/2018   Procedure: CYSTOSCOPY/URETEROSCOPY/RETROGRADE PYELOGRAM/HOLMIUM LASER/STENT PLACEMENT;  Surgeon: Alexis Frock, MD;  Location: [facility] ORS;  Service: Urology;  Laterality: Right;  . CYSTOSCOPY/URETEROSCOPY/HOLMIUM LASER/STENT PLACEMENT Right 02/07/2018   Procedure: CYSTOSCOPY LEFT STENT EXCHANGE;  Surgeon: Alexis Frock, MD;  Location: [facility] ORS;   Service: Urology;  Laterality: Right;  . DILATION AND CURETTAGE OF UTERUS    . ECTOPIC PREGNANCY SURGERY    . IR NEPHROSTOMY PLACEMENT LEFT  12/25/2017  . LOOP RECORDER INSERTION N/A 12/23/2018   Procedure: LOOP RECORDER INSERTION;  Surgeon: Constance Haw, MD;  Location: [facility];  Service: Cardiovascular;  Laterality: N/A;  . NEPHROLITHOTOMY Left 02/04/2018   Procedure: NEPHROLITHOTOMY PERCUTANEOUS;  Surgeon: Alexis Frock, MD;  Location: [facility] ORS;  Service: Urology;  Laterality: Left;  3 HRS  . NEPHROLITHOTOMY Left 02/07/2018   Procedure: SECOND LOOK NEPHROLITHOTOMY PERCUTANEOUS;  Surgeon: Alexis Frock, MD;  Location: [facility] ORS;  Service: Urology;  Laterality: Left;  2 HRS  . OVARIAN CYST REMOVAL    . TEE WITHOUT CARDIOVERSION N/A 12/23/2018   Procedure: TRANSESOPHAGEAL ECHOCARDIOGRAM (TEE);  Surgeon: Lelon Perla, MD;  Location: [facility] ENDOSCOPY;  Service: Cardiovascular;  Laterality: N/A;  . UNILATERAL SALPINGECTOMY     Pt unsure of which fallopian tube was removed  . WISDOM TOOTH EXTRACTION      Family History  Problem Relation Age of Onset  . Diabetes Paternal Grandfather   . COPD Paternal Grandfather   . Heart disease Paternal Grandfather   . Cancer Paternal Grandfather        bone  . Cancer Paternal Grandmother   . Heart disease Paternal Grandmother   . Cancer Father   . Hypertension Father   . Heart attack Father   . Alcohol abuse Father   . Depression Father   . Heart failure Mother   . Diabetes Mother   . Heart disease Mother   . Hyperlipidemia Mother   . Hypertension Mother   . Heart attack Mother   . Peripheral vascular disease Mother   . Depression Mother   . COPD Mother   . Hypertension Sister   . Heart attack Sister   . Stroke Maternal Grandmother     Social History Reviewed with no changes to be made today.   Outpatient Medications Prior to Visit  Medication Sig Dispense Refill  . atorvastatin (LIPITOR) 80 MG tablet Take 1 tablet (80  mg total) by mouth daily at 6 PM. 90 tablet 2  . AZO CRANBERRY GUMMIES PO Take 2 tablets by mouth daily.    . Blood Glucose Monitoring Suppl (ONETOUCH VERIO) w/Device KIT Use to check blood sugars every morning fasting and 2 hours after largest meal 1 kit 0  . glucose blood (ONETOUCH VERIO) test strip Use to check blood sugars every morning fasting and 2 hours after largest meal 200 each 3  . indapamide (LOZOL) 2.5 MG tablet Take 2.5 mg by mouth every evening.    . Insulin Glargine (LANTUS) 100 UNIT/ML Solostar Pen Inject 15 Units into the skin at bedtime. 15 mL prn  . Insulin Pen Needle (PEN NEEDLES) 31G X 8 MM MISC 1 each by Does  not apply route daily. 100 each 3  . metFORMIN (GLUCOPHAGE) 500 MG tablet Take 1 tablet (500 mg total) by mouth 2 (two) times daily with a meal. 180 tablet 0  . Multiple Vitamins-Minerals (ONE-A-DAY WOMENS 50+ ADVANTAGE) TABS Take 1 tablet by mouth daily.    . ONE TOUCH LANCETS MISC Use to check blood sugars every morning fasting and 2 hours after largest meal 200 each 3  . acetaminophen (TYLENOL) 500 MG tablet Take 1,000 mg by mouth 2 (two) times daily as needed for moderate pain or headache.    . gabapentin (NEURONTIN) 300 MG capsule Take 1 capsule (300 mg total) by mouth 2 (two) times daily. 180 capsule 0  . senna-docusate (SENOKOT-S) 8.6-50 MG tablet Take 1 tablet by mouth 2 (two) times daily. While taking strong pain meds to prevent constipation. (Patient not taking: Reported on 03/21/2019) 10 tablet 0  . topiramate (TOPAMAX) 25 MG tablet TAKE 1 TABLET BY MOUTH DAILY, MAY INCREASE BY ONE TABLET WEEKLY FOR A MAX OF 100MG (Patient taking differently: Take 25 mg by mouth 2 (two) times daily. ) 120 tablet 2  . traMADol (ULTRAM) 50 MG tablet Take 1-2 tablets (50-100 mg total) by mouth every 6 (six) hours as needed for severe pain. Post-operatively (Patient not taking: Reported on 03/21/2019) 15 tablet 0  . ondansetron (ZOFRAN) 4 MG tablet Take 1 tablet (4 mg total) by mouth  every 8 (eight) hours as needed for nausea or vomiting. 20 tablet 1  . senna-docusate (SENOKOT-S) 8.6-50 MG tablet Take 2 tablets by mouth at bedtime as needed for mild constipation. 30 tablet 2  . ketorolac (TORADOL) 10 MG tablet Take 1 tablet (10 mg total) by mouth every 8 (eight) hours as needed for moderate pain. Or stent discomfort post-operatively (Patient not taking: Reported on 03/21/2019) 20 tablet 0  . nitrofurantoin, macrocrystal-monohydrate, (MACROBID) 100 MG capsule Take 100 mg by mouth 2 (two) times daily.     No facility-administered medications prior to visit.    Allergies  Allergen Reactions  . Sulfa Antibiotics Hives and Swelling    Swelling site not recalled  . Latex Rash  . Tape Rash    Not tolerated well       Objective:    BP (!) 88/65 (BP Location: Left Arm, Patient Position: Sitting, Cuff Size: Large)   Pulse 89   Temp 98.7 F (37.1 C) (Oral)   Ht _0  (1.6 m)   Wt 219 lb (99.3 kg)   SpO2 97%   BMI 38.79 kg/m  Wt Readings from Last 3 Encounters:  03/21/19 223 lb (101.2 kg)  03/10/19 219 lb (99.3 kg)  01/26/19 223 lb 3 oz (101.2 kg)    Physical Exam Vitals and nursing note reviewed.  Constitutional:      Appearance: She is well-developed.  HENT:     Head: Normocephalic and atraumatic.  Cardiovascular:     Rate and Rhythm: Normal rate and regular rhythm.     Heart sounds: Normal heart sounds. No murmur. No friction rub. No gallop.   Pulmonary:     Effort: Pulmonary effort is normal. No tachypnea or respiratory distress.     Breath sounds: Normal breath sounds. No decreased breath sounds, wheezing, rhonchi or rales.  Chest:     Chest wall: No tenderness.  Abdominal:     General: Bowel sounds are normal.     Palpations: Abdomen is soft.  Musculoskeletal:        General: Normal range of motion.  Cervical back: Normal range of motion.  Skin:    General: Skin is warm and dry.  Neurological:     Mental Status: She is alert and oriented to  person, place, and time.     Coordination: Coordination normal.  Psychiatric:        Behavior: Behavior normal. Behavior is cooperative.        Thought Content: Thought content normal.        Judgment: Judgment normal.          Patient has been counseled extensively about nutrition and exercise as well as the importance of adherence with medications and regular follow-up. The patient was given clear instructions to go to ER or return to medical center if symptoms don't improve, worsen or new problems develop. The patient verbalized understanding.   Follow-up: Return in about 3 months (around 06/08/2019).   [name], FNP-BC [facility], [place]   03/23/2019, 2:35 PM

## 2019-03-13 ENCOUNTER — Encounter: Payer: Self-pay | Admitting: Obstetrics and Gynecology

## 2019-03-14 ENCOUNTER — Ambulatory Visit: Payer: 59

## 2019-03-21 ENCOUNTER — Encounter: Payer: Self-pay | Admitting: Neurology

## 2019-03-21 ENCOUNTER — Other Ambulatory Visit: Payer: Self-pay

## 2019-03-21 ENCOUNTER — Ambulatory Visit (INDEPENDENT_AMBULATORY_CARE_PROVIDER_SITE_OTHER): Payer: 59 | Admitting: Neurology

## 2019-03-21 VITALS — BP 90/70 | HR 65 | Temp 97.5°F | Ht 63.0 in | Wt 223.0 lb

## 2019-03-21 DIAGNOSIS — G4440 Drug-induced headache, not elsewhere classified, not intractable: Secondary | ICD-10-CM | POA: Diagnosis not present

## 2019-03-21 DIAGNOSIS — I639 Cerebral infarction, unspecified: Secondary | ICD-10-CM

## 2019-03-21 DIAGNOSIS — H53461 Homonymous bilateral field defects, right side: Secondary | ICD-10-CM | POA: Diagnosis not present

## 2019-03-21 MED ORDER — TOPIRAMATE 100 MG PO TABS: 100.0000 mg | ORAL_TABLET | Freq: Two times a day (BID) | ORAL | 2 refills | Status: DC

## 2019-03-21 NOTE — Patient Instructions (Signed)
I had a long d/w patient about her  recent cryptogenic stroke, intracranial atherosclerosis,risk for recurrent stroke/TIAs, personally independently reviewed imaging studies and stroke evaluation results and answered questions.Continue Plavix 75 mg daily alone and discontinue aspirin as it has been nearly 3 months since her stroke for secondary stroke prevention and maintain strict control of hypertension with blood pressure goal below 130/90, diabetes with hemoglobin A1c goal below 6.5% and lipids with LDL cholesterol goal below 70 mg/dL.  I have strongly encouraged the patient to quit smoking completely and she states she will try.  She will get help from primary care physician for the same.  I also advised the patient to eat a healthy diet with plenty of whole grains, cereals, fruits and vegetables, exercise regularly and maintain ideal body weight .I recommend she discontinue Tylenol as she has a component of analgesic rebound headaches and increase the dose of Topamax 200 mg twice daily to help both with headache prevention as well as paresthesias from diabetic neuropathy.  Followup in the future with my nurse practitioner Janett Billow in 3 months or call earlier if necessary.  Stroke Prevention Some medical conditions and behaviors are associated with a higher chance of having a stroke. You can help prevent a stroke by making nutrition, lifestyle, and other changes, including managing any medical conditions you may have. What nutrition changes can be made?   Eat healthy foods. You can do this by: ? Choosing foods high in fiber, such as fresh fruits and vegetables and whole grains. ? Eating at least 5 or more servings of fruits and vegetables a day. Try to fill half of your plate at each meal with fruits and vegetables. ? Choosing lean protein foods, such as lean cuts of meat, poultry without skin, fish, tofu, beans, and nuts. ? Eating low-fat dairy products. ? Avoiding foods that are high in salt  (sodium). This can help lower blood pressure. ? Avoiding foods that have saturated fat, trans fat, and cholesterol. This can help prevent high cholesterol. ? Avoiding processed and premade foods.  Follow your health care provider's specific guidelines for losing weight, controlling high blood pressure (hypertension), lowering high cholesterol, and managing diabetes. These may include: ? Reducing your daily calorie intake. ? Limiting your daily sodium intake to 1,500 milligrams (mg). ? Using only healthy fats for cooking, such as olive oil, canola oil, or sunflower oil. ? Counting your daily carbohydrate intake. What lifestyle changes can be made?  Maintain a healthy weight. Talk to your health care provider about your ideal weight.  Get at least 30 minutes of moderate physical activity at least 5 days a week. Moderate activity includes brisk walking, biking, and swimming.  Do not use any products that contain nicotine or tobacco, such as cigarettes and e-cigarettes. If you need help quitting, ask your health care provider. It may also be helpful to avoid exposure to secondhand smoke.  Limit alcohol intake to no more than 1 drink a day for nonpregnant women and 2 drinks a day for men. One drink equals 12 oz of beer, 5 oz of wine, or 1 oz of hard liquor.  Stop any illegal drug use.  Avoid taking birth control pills. Talk to your health care provider about the risks of taking birth control pills if: ? You are over 95 years old. ? You smoke. ? You get migraines. ? You have ever had a blood clot. What other changes can be made?  Manage your cholesterol levels. ? Eating a healthy diet  is important for preventing high cholesterol. If cholesterol cannot be managed through diet alone, you may also need to take medicines. ? Take any prescribed medicines to control your cholesterol as told by your health care provider.  Manage your diabetes. ? Eating a healthy diet and exercising regularly are  important parts of managing your blood sugar. If your blood sugar cannot be managed through diet and exercise, you may need to take medicines. ? Take any prescribed medicines to control your diabetes as told by your health care provider.  Control your hypertension. ? To reduce your risk of stroke, try to keep your blood pressure below 130/80. ? Eating a healthy diet and exercising regularly are an important part of controlling your blood pressure. If your blood pressure cannot be managed through diet and exercise, you may need to take medicines. ? Take any prescribed medicines to control hypertension as told by your health care provider. ? Ask your health care provider if you should monitor your blood pressure at home. ? Have your blood pressure checked every year, even if your blood pressure is normal. Blood pressure increases with age and some medical conditions.  Get evaluated for sleep disorders (sleep apnea). Talk to your health care provider about getting a sleep evaluation if you snore a lot or have excessive sleepiness.  Take over-the-counter and prescription medicines only as told by your health care provider. Aspirin or blood thinners (antiplatelets or anticoagulants) may be recommended to reduce your risk of forming blood clots that can lead to stroke.  Make sure that any other medical conditions you have, such as atrial fibrillation or atherosclerosis, are managed. What are the warning signs of a stroke? The warning signs of a stroke can be easily remembered as BEFAST.  B is for balance. Signs include: ? Dizziness. ? Loss of balance or coordination. ? Sudden trouble walking.  E is for eyes. Signs include: ? A sudden change in vision. ? Trouble seeing.  F is for face. Signs include: ? Sudden weakness or numbness of the face. ? The face or eyelid drooping to one side.  A is for arms. Signs include: ? Sudden weakness or numbness of the arm, usually on one side of the  body.  S is for speech. Signs include: ? Trouble speaking (aphasia). ? Trouble understanding.  T is for time. ? These symptoms may represent a serious problem that is an emergency. Do not wait to see if the symptoms will go away. Get medical help right away. Call your local emergency services (911 in the [place] Do not drive yourself to the hospital.  Other signs of stroke may include: ? A sudden, severe headache with no known cause. ? Nausea or vomiting. ? Seizure. Where to find more information For more information, visit:  American Stroke Association: www.strokeassociation.org  National Stroke Association: www.stroke.org Summary  You can prevent a stroke by eating healthy, exercising, not smoking, limiting alcohol intake, and managing any medical conditions you may have.  Do not use any products that contain nicotine or tobacco, such as cigarettes and e-cigarettes. If you need help quitting, ask your health care provider. It may also be helpful to avoid exposure to secondhand smoke.  Remember BEFAST for warning signs of stroke. Get help right away if you or a loved one has any of these signs. This information is not intended to replace advice given to you by your health care provider. Make sure you discuss any questions you have with your health care provider.  Document Released: 04/16/2004 Document Revised: 02/19/2017 Document Reviewed: 04/14/2016 Elsevier Patient Education  2020 [employer].

## 2019-03-21 NOTE — Progress Notes (Signed)
[facility] [address] [place]. [place] [address] [phone]       OFFICE FOLLOW-UP NOTE  [name] Date of Birth:  1966-02-02 Medical Record Number:  [id number]   HPI: [name] is a 53 year old Caucasian lady seen today for initial office follow-up visit following hospital admission for stroke in September 2020.  History is obtained from the patient and review of electronic medical records and I personally reviewed imaging films in PACS.  She presented on 12/21/2018 with vision abnormalities in the right eye for couple of days.  She saw her ophthalmologist the day prior and was found to have 20/25 vision in both eyes but right homonymous hemianopsia on exam.  MRI scan of the brain was obtained which showed left medial occipital infarct.  CT angiogram of the head and neck showed left posterior cerebral artery occlusion in the P3 segment with widespread atherosclerosis involving the head and the neck with severe left subclavian stenosis, severe right ICA bulb stenosis 70 to 80%.  Left vertebral artery occlusion, left ICA bulb 60% stenosis and moderate left ICA siphon stenosis with diminutive basilar artery.  Carotid ultrasound showed 50 to 79% right ICA and 40 to 59% left ICA stenosis.  2D echo showed no cardiac source of embolism.  TEE was negative for PFO or vegetations.  LDL cholesterol 99 mg percent.  Hemoglobin A1c was 8.6.  Patient had loop recorder inserted and so for paroxysmal A. fib has not yet been found.  She was discharged on aspirin and Plavix and states she is tolerating it well without bruising or bleeding.  She has noticed improvement in her peripheral vision and now she is able to see much more.  She has not been driving a lot but only when she has to and she has to be careful.  She has a follow-up appointment with her eye doctor to check her peripheral field of vision.  She states her short-term memory has improved and her gait and balance have improved  and she is able to ambulate independently without assistance.  She does have tingling numbness in the feet from diabetic neuropathy which is longstanding.  She does take gabapentin 300 mg 4 times daily which is not very effective.  She has longstanding history of migraine headaches but of late has noticed increased headache frequency.  She has been taking Topamax currently 50 mg twice daily for the last 4 to 6 weeks without much benefit.  She takes about 8 to 10 tablets of Tylenol on a daily basis as well.  ROS:   14 system review of systems is positive for vision loss, tingling, numbness, imbalance and all other systems negative  PMH:  Past Medical History:  Diagnosis Date  . Abnormal Pap smear   . AKI (acute kidney injury) ([place]) 12/28/2017  . Anxiety   . Arthritis    bil knees, neck  . Bipolar 1 disorder ([place])   . Cataract    Mild  . Cholelithiasis 12/23/2017   noted on CT renal, pt unaware  . Chronic kidney disease (CKD), stage III (moderate)   . Cystitis   . Depression   . Diabetes mellitus without complication ([place])    type 2  . Genital warts    Hx of genital  . GERD (gastroesophageal reflux disease)   . Heart murmur    childhood  . Hematuria   . History of blood transfusion   . History of ectopic pregnancy   . History of gestational  diabetes   . History of kidney stones   . History of sepsis    after ectopic pregnancy  . Idiopathic peripheral neuropathy    both feet  . Leg ulcer, left ([place])   . Low back pain   . Migraines   . Obesity   . Oral candidiasis   . Pneumonia   . PONV (postoperative nausea and vomiting)    prolonged sedation  . Recurrent UTI   . 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 Sigmoid diverticulosis 12/23/2017   noted on CT renal, pt unaware  . Sleep apnea    uses cpap  . Stroke [facility])    Cryptogenic  . Ulcer of foot ([place])    Left  . Weakness      Social History:  Social History   Socioeconomic History  . Marital status: Married    Spouse name: Not on file  . Number of children: Not on file  . Years of education: Not on file  . Highest education level: Not on file  Occupational History  . Not on file  Tobacco Use  . Smoking status: Current Every Day Smoker    Packs/day: 1.00    Years: 37.00    Pack years: 37.00    Types: Cigarettes  . Smokeless tobacco: Never Used  Substance and Sexual Activity  . Alcohol use: No    Alcohol/week: 0.0 standard drinks  . Drug use: No  . Sexual activity: Not Currently    Birth control/protection: I.U.D.    Comment: Mirena  Other Topics Concern  . Not on file  Social History Narrative  . Not on file   Social Determinants of Health   Financial Resource Strain:   . Difficulty of Paying Living Expenses: Not on file  Food Insecurity:   . Worried About Charity fundraiser in the Last Year: Not on file  . Ran Out of Food in the Last Year: Not on file  Transportation Needs:   . Lack of Transportation (Medical): Not on file  . Lack of Transportation (Non-Medical): Not on file  Physical Activity:   . Days of Exercise per Week: Not on file  . Minutes of Exercise per Session: Not on file  Stress:   . Feeling of Stress : Not on file  Social Connections:   . Frequency of Communication with Friends and Family: Not on file  . Frequency of Social Gatherings with Friends and Family: Not on file  . Attends Religious Services: Not on file  . Active Member of Clubs or Organizations: Not on file  . Attends Archivist Meetings: Not on file  . Marital Status: Not on file  Intimate Partner Violence:   . Fear of Current or Ex-Partner: Not on file  . Emotionally Abused: Not on file  . Physically Abused: Not on file  . Sexually Abused: Not on file    Medications:   Current Outpatient Medications on File Prior to Visit  Medication Sig Dispense Refill  . atorvastatin (LIPITOR) 80  MG tablet Take 1 tablet (80 mg total) by mouth daily at 6 PM. 90 tablet 2  . AZO CRANBERRY GUMMIES PO Take 2 tablets by mouth daily.    . Blood Glucose Monitoring Suppl (ONETOUCH VERIO) w/Device KIT Use to check blood sugars every morning fasting and 2 hours after largest meal 1 kit 0  . clopidogrel (PLAVIX) 75 MG tablet Take  75 mg by mouth daily.    Marland Kitchen gabapentin (NEURONTIN) 300 MG capsule Take 1 capsule (300 mg total) by mouth 4 (four) times daily. 360 capsule 0  . glucose blood (ONETOUCH VERIO) test strip Use to check blood sugars every morning fasting and 2 hours after largest meal 200 each 3  . indapamide (LOZOL) 2.5 MG tablet Take 2.5 mg by mouth every evening.    . Insulin Glargine (LANTUS) 100 UNIT/ML Solostar Pen Inject 15 Units into the skin at bedtime. 15 mL prn  . Insulin Pen Needle (PEN NEEDLES) 31G X 8 MM MISC 1 each by Does not apply route daily. 100 each 3  . metFORMIN (GLUCOPHAGE) 500 MG tablet Take 1 tablet (500 mg total) by mouth 2 (two) times daily with a meal. 180 tablet 0  . Multiple Vitamins-Minerals (ONE-A-DAY WOMENS 50+ ADVANTAGE) TABS Take 1 tablet by mouth daily.    . ondansetron (ZOFRAN) 4 MG tablet Take 1 tablet (4 mg total) by mouth every 8 (eight) hours as needed for nausea or vomiting. 20 tablet 1  . ONE TOUCH LANCETS MISC Use to check blood sugars every morning fasting and 2 hours after largest meal 200 each 3  . senna-docusate (SENOKOT-S) 8.6-50 MG tablet Take 2 tablets by mouth at bedtime as needed for mild constipation. 30 tablet 2   No current facility-administered medications on file prior to visit.    Allergies:   Allergies  Allergen Reactions  . Sulfa Antibiotics Hives and Swelling    Swelling site not recalled  . Latex Rash  . Tape Rash    Not tolerated well    Physical Exam General: Obese middle-aged Caucasian lady, seated, in no evident distress Head: head normocephalic and atraumatic.  Neck: supple with no carotid or supraclavicular  bruits Cardiovascular: regular rate and rhythm, no murmurs Musculoskeletal: no deformity Skin:  no rash/petichiae Vascular:  Normal pulses all extremities Vitals:   03/21/19 0830  BP: 90/70  Pulse: 65  Temp: (!) 97.5 F (36.4 C)   Neurologic Exam Mental Status: Awake and fully alert. Oriented to place and time.  Diminished recall 2/3.  Attention span, concentration and fund of knowledge appropriate. Mood and affect appropriate.  Cranial Nerves: Fundoscopic exam reveals sharp disc margins. Pupils equal, briskly reactive to light. Extraocular movements full without nystagmus. Visual fields show partial right temporal field vision loss to confrontation. Hearing intact. Facial sensation intact. Face, tongue, palate moves normally and symmetrically.  Motor: Normal bulk and tone. Normal strength in all tested extremity muscles. Sensory.: intact to touch ,pinprick .but diminished position and vibratory sensation in the toes bilaterally.  Coordination: Rapid alternating movements normal in all extremities. Finger-to-nose and heel-to-shin performed accurately bilaterally. Gait and Station: Arises from chair without difficulty. Stance is normal. Gait demonstrates normal stride length and balance .  Not able to heel, toe and tandem walk without difficulty.  Reflexes: 1+ and symmetric except ankle jerks are depressed.. Toes downgoing.   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  She is done reasonably well but still has some partial peripheral vision loss.  2. She also has an medication overuse headaches as well as  3.diabetic sensory peripheral neuropathy with paresthesias which are bothersome     PLAN: I had a long d/w patient about her  recent  cryptogenic stroke,  intracranial atherosclerosis,risk for recurrent stroke/TIAs, personally independently reviewed imaging studies and stroke evaluation results and answered questions.Continue Plavix 75 mg daily alone and discontinue aspirin as it has been nearly 3 months since her stroke for secondary stroke prevention and maintain strict control of hypertension with blood pressure goal below 130/90, diabetes with hemoglobin A1c goal below 6.5% and lipids with LDL cholesterol goal below 70 mg/dL.  I have strongly encouraged the patient to quit smoking completely and she states she will try.  She will get help from primary care physician for the same.  I also advised the patient to eat a healthy diet with plenty of whole grains, cereals, fruits and vegetables, exercise regularly and maintain ideal body weight .I recommend she discontinue Tylenol as she has a component of analgesic rebound headaches and increase the dose of Topamax 200 mg twice daily to help both with headache prevention as well as paresthesias from diabetic neuropathy.  Followup in the future with my nurse practitioner Janett Billow in 3 months or call earlier if necessary. Greater than 50% of time during this 25 minute visit was spent on counseling,explanation of diagnosis, planning of further management, discussion with patient and family and coordination of care Antony Contras, MD  [facility] [address] [place], [place] [address]  Phone [phone] Fax [phone] Note: This document was prepared with digital dictation and possible smart phrase technology. Any transcriptional errors that result from this process are unintentional

## 2019-03-23 ENCOUNTER — Encounter: Payer: Self-pay | Admitting: Nurse Practitioner

## 2019-03-26 ENCOUNTER — Encounter: Payer: Self-pay | Admitting: Nurse Practitioner

## 2019-04-03 ENCOUNTER — Telehealth: Payer: Self-pay

## 2019-04-03 ENCOUNTER — Ambulatory Visit (INDEPENDENT_AMBULATORY_CARE_PROVIDER_SITE_OTHER): Payer: 59 | Admitting: Gastroenterology

## 2019-04-03 ENCOUNTER — Encounter: Payer: Self-pay | Admitting: Gastroenterology

## 2019-04-03 VITALS — BP 90/50 | HR 88 | Temp 96.6°F | Ht 63.0 in | Wt 220.2 lb

## 2019-04-03 DIAGNOSIS — R194 Change in bowel habit: Secondary | ICD-10-CM

## 2019-04-03 DIAGNOSIS — Z01818 Encounter for other preprocedural examination: Secondary | ICD-10-CM | POA: Diagnosis not present

## 2019-04-03 MED ORDER — SUPREP BOWEL PREP KIT 17.5-3.13-1.6 GM/177ML PO SOLN: 1.0000 | ORAL | 0 refills | Status: DC

## 2019-04-03 NOTE — Telephone Encounter (Signed)
[facility] Pre-operative Risk Assessment     Request for surgical clearance:     Endoscopy Procedure  What type of surgery is being performed?    Colonoscopy  When is this surgery scheduled?     05/19/2019  What type of clearance is required ?   Pharmacy  Are there any medications that need to be held prior to surgery and how long? Plavix x 5 days  Practice name and name of physician performing surgery?      [facility]  What is your office phone and fax number?      Phone- [phone]  [phone]  Anesthesia type (None, local, MAC, general) ?       MAC

## 2019-04-03 NOTE — Patient Instructions (Addendum)
I have recommended a colonoscopy given the change in your bowel habits.  In the meantime, please start taking a daily stool bulking agent such as Metamucil.   Drink at least 64 ounces of water daily.  Eat a high fiber diet. Recent studies show that two peeled kiwi daily can improve constipation.    Tips for colonoscopy:  - Stay well hydrated for 3-4 days prior to the exam. This reduces nausea and dehydration.  - To prevent skin/hemorrhoid irritation - prior to wiping, put A&Dointment or vaseline on the toilet paper. - Keep a towel or pad on the bed.  - Drink  64oz of clear liquids in the morning of prep day (prior to starting the prep) to be sure that there is enough fluid to flush the colon and stay hydrated!!!! This is in addition to the fluids required for preparation. - Use of a flavored hard candy, such as grape Anise Salvo, can counteract some of the flavor of the prep and may prevent some nausea.   If you are age 86 or older, your body mass index should be between 23-30. Your Body mass index is 39.02 kg/m. If this is out of the aforementioned range listed, please consider follow up with your Primary Care Provider.  If you are age 20 or younger, your body mass index should be between 19-25. Your Body mass index is 39.02 kg/m. If this is out of the aformentioned range listed, please consider follow up with your Primary Care Provider.   Due to recent changes in healthcare laws, you may see the results of your imaging and laboratory studies on MyChart before your provider has had a chance to review them.  We understand that in some cases there may be results that are confusing or concerning to you. Not all laboratory results come back in the same time frame and the provider may be waiting for multiple results in order to interpret others.  Please give [place] 48 hours in order for your provider to thoroughly review all the results before contacting the office for clarification of your results.

## 2019-04-03 NOTE — Progress Notes (Signed)
Referring Provider: [name], NP Primary Care Physician:  [name], NP  Reason for Consultation: Constipation  IMPRESSION:  Change in bowel habits Constipation No prior colon cancer screening On Plavix following CVA 11/2018 (NP Raul Del prescribes)  Change in bowel habits following CVA not responding to laxative therapy without alarm features. Colonoscopy recommended to evaluate the change in bowel habits after a Plavix washout.  As she has never had any colon cancer screening must consider the possibility of a polyp or mass.  Will need to hold Plavix for 5 days before endoscopy.  I discussed with the patient that there is a low, but real, risk of a cardiovascular event such as heart attack, stroke, or embolism/thrombosis while off Plavix. Will communicate by phone or EMR with NP Raul Del to confirm that holding the Plavix is appropriate at this time.    PLAN: Add a daily stool bulking agent such as Metamucil High fiber diet, drink at least 64 ounces of water daily Colonoscopy after a Plavix washout with a two day bowel prep  Please see the "Patient Instructions" section for addition details about the plan.  HPI: Pamela Carney is a 54 y.o. female referred by NP Raul Del for further evaluation of constipation not responding to once daily stool softener.  The history is obtained through the patient and review of her electronic health record. She has diabetes, depression, intracranial atherosclerosis, obesity, anxiety, sleep apnea, hypertension, hyperlipidemia, headaches, nephrolithiasis, and smoking.  She had a stroke in September 2020 and is on ASA and Plavix.  Change in bowel habits following the stroke. Now with constipation. There is a history of straining, lumpy hard stools, sensation of incomplete evacuation, use of digital maneuvers, and an intermittent sensation of anorectal obstruction or blockage with 25 percent of bowel movements.   Senna BID will result in one BM  daily on most days.  Has not tried any other prescription or over-the-counter therapies. Acknowledges that she does not drink enough water.  She has made no other dietary changes.   Drinks 4-6 caffeinated beverages daily.  Denies alcohol, smoking, or illicit street drugs.  TSH 0.456 12/16/18 Calcium 9.6 01/26/19  No prior colon cancer screening.  Sister with uterine cancer.  No known family history of colon cancer or polyps. No other known family history of uterine/endometrial cancer, pancreatic cancer or gastric/stomach cancer.   Past Medical History:  Diagnosis Date  . Abnormal Pap smear   . AKI (acute kidney injury) ([place]) 12/28/2017  . Anxiety   . Arthritis    bil knees, neck  . Bipolar 1 disorder ([place])   . Cataract    Mild  . Cholelithiasis 12/23/2017   noted on CT renal, pt unaware  . Chronic kidney disease (CKD), stage III (moderate)   . Cystitis   . Depression   . Diabetes mellitus without complication ([place])    type 2  . Genital warts    Hx of genital  . GERD (gastroesophageal reflux disease)   . Heart murmur    childhood  . Hematuria   . History of blood transfusion   . History of ectopic pregnancy   . History of gestational diabetes   . History of kidney stones   . History of sepsis    after ectopic pregnancy  . Idiopathic peripheral neuropathy    both feet  . Leg ulcer, left ([place])   . Low back pain   . Migraines   . Obesity   . Oral candidiasis   .  Pneumonia   . PONV (postoperative nausea and vomiting)    prolonged sedation  . Recurrent UTI   . 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 Sigmoid diverticulosis 12/23/2017   noted on CT renal, pt unaware  . Sleep apnea    uses cpap  . 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 Ulcer of foot ([place])    Left  . Weakness     Past Surgical History:  Procedure Laterality Date  . BUBBLE STUDY  12/23/2018   Procedure: BUBBLE STUDY;   Surgeon: Lelon Perla, MD;  Location: [facility] ENDOSCOPY;  Service: Cardiovascular;;  . CERVICAL CONE BIOPSY    . CESAREAN SECTION     x3  . CYSTOSCOPY W/ URETERAL STENT PLACEMENT Right 12/15/2018   Procedure: Cystoscopy, right retrograde ureteropyelogram, fluoroscopic interpretation, right double-J stent placement (24 cm x 6 [place]);  Surgeon: Franchot Gallo, MD;  Location: [facility];  Service: Urology;  Laterality: Right;  . CYSTOSCOPY WITH RETROGRADE PYELOGRAM, URETEROSCOPY AND STENT PLACEMENT Right 01/27/2019   Procedure: CYSTOSCOPY WITH RETROGRADE PYELOGRAM, URETEROSCOPY AND STENT PLACEMENT; BASKET STONE RETRIEVAL;  Surgeon: Alexis Frock, MD;  Location: [facility] ORS;  Service: Urology;  Laterality: Right;  75 MINS  . CYSTOSCOPY/URETEROSCOPY/HOLMIUM LASER/STENT PLACEMENT Right 02/04/2018   Procedure: CYSTOSCOPY/URETEROSCOPY/RETROGRADE PYELOGRAM/HOLMIUM LASER/STENT PLACEMENT;  Surgeon: Alexis Frock, MD;  Location: [facility];  Service: Urology;  Laterality: Right;  . CYSTOSCOPY/URETEROSCOPY/HOLMIUM LASER/STENT PLACEMENT Right 02/07/2018   Procedure: CYSTOSCOPY LEFT STENT EXCHANGE;  Surgeon: Alexis Frock, MD;  Location: [facility];  Service: Urology;  Laterality: Right;  . DILATION AND CURETTAGE OF UTERUS    . ECTOPIC PREGNANCY SURGERY    . IR NEPHROSTOMY PLACEMENT LEFT  12/25/2017  . LOOP RECORDER INSERTION N/A 12/23/2018   Procedure: LOOP RECORDER INSERTION;  Surgeon: Constance Haw, MD;  Location: [place] LAB;  Service: Cardiovascular;  Laterality: N/A;  . NEPHROLITHOTOMY Left 02/04/2018   Procedure: NEPHROLITHOTOMY PERCUTANEOUS;  Surgeon: Alexis Frock, MD;  Location: [facility];  Service: Urology;  Laterality: Left;  3 HRS  . NEPHROLITHOTOMY Left 02/07/2018   Procedure: SECOND LOOK NEPHROLITHOTOMY PERCUTANEOUS;  Surgeon: Alexis Frock, MD;  Location: [facility];  Service: Urology;  Laterality: Left;  2 HRS  . OVARIAN CYST REMOVAL    . TEE WITHOUT CARDIOVERSION N/A 12/23/2018   Procedure:  TRANSESOPHAGEAL ECHOCARDIOGRAM (TEE);  Surgeon: Lelon Perla, MD;  Location: [facility] ENDOSCOPY;  Service: Cardiovascular;  Laterality: N/A;  . UNILATERAL SALPINGECTOMY     Pt unsure of which fallopian tube was removed  . WISDOM TOOTH EXTRACTION      Current Outpatient Medications  Medication Sig Dispense Refill  . atorvastatin (LIPITOR) 80 MG tablet Take 1 tablet (80 mg total) by mouth daily at 6 PM. 90 tablet 2  . AZO CRANBERRY GUMMIES PO Take 2 tablets by mouth daily.    . Blood Glucose Monitoring Suppl (ONETOUCH VERIO) w/Device KIT Use to check blood sugars every morning fasting and 2 hours after largest meal 1 kit 0  . clopidogrel (PLAVIX) 75 MG tablet Take 75 mg by mouth daily.    Marland Kitchen gabapentin (NEURONTIN) 300 MG capsule Take 1 capsule (300 mg total) by mouth 4 (four) times daily. 360 capsule 0  . glucose blood (ONETOUCH VERIO) test strip Use to check blood sugars every morning fasting and 2 hours after largest meal 200 each 3  . indapamide (LOZOL) 2.5 MG tablet Take 2.5 mg by  mouth every evening.    . Insulin Glargine (LANTUS) 100 UNIT/ML Solostar Pen Inject 15 Units into the skin at bedtime. 15 mL prn  . Insulin Pen Needle (PEN NEEDLES) 31G X 8 MM MISC 1 each by Does not apply route daily. 100 each 3  . metFORMIN (GLUCOPHAGE) 500 MG tablet Take 1 tablet (500 mg total) by mouth 2 (two) times daily with a meal. 180 tablet 0  . Multiple Vitamins-Minerals (ONE-A-DAY WOMENS 50+ ADVANTAGE) TABS Take 1 tablet by mouth daily.    . ondansetron (ZOFRAN) 4 MG tablet Take 1 tablet (4 mg total) by mouth every 8 (eight) hours as needed for nausea or vomiting. 20 tablet 1  . ONE TOUCH LANCETS MISC Use to check blood sugars every morning fasting and 2 hours after largest meal 200 each 3  . topiramate (TOPAMAX) 100 MG tablet Take 1 tablet (100 mg total) by mouth 2 (two) times daily. 60 tablet 2  . senna-docusate (SENOKOT-S) 8.6-50 MG tablet Take 2 tablets by mouth at bedtime as needed for mild  constipation. (Patient not taking: Reported on 04/03/2019) 30 tablet 2   No current facility-administered medications for this visit.    Allergies as of 04/03/2019 - Review Complete 04/03/2019  Allergen Reaction Noted  . Sulfa antibiotics Hives and Swelling 12/27/2012  . Latex Rash 12/23/2017  . Tape Rash 12/23/2017    Family History  Problem Relation Age of Onset  . Diabetes Paternal Grandfather   . COPD Paternal Grandfather   . Heart disease Paternal Grandfather   . Cancer Paternal Grandfather        bone  . Cancer Paternal Grandmother   . Heart disease Paternal Grandmother   . Cancer Father   . Hypertension Father   . Heart attack Father   . Alcohol abuse Father   . Depression Father   . Heart failure Mother   . Diabetes Mother   . Heart disease Mother   . Hyperlipidemia Mother   . Hypertension Mother   . Heart attack Mother   . Peripheral vascular disease Mother   . Depression Mother   . COPD Mother   . Hypertension Sister   . Heart attack Sister   . Stroke Maternal Grandmother     Social History   Socioeconomic History  . Marital status: Married    Spouse name: Not on file  . Number of children: Not on file  . Years of education: Not on file  . Highest education level: Not on file  Occupational History  . Not on file  Tobacco Use  . Smoking status: Current Every Day Smoker    Packs/day: 1.00    Years: 37.00    Pack years: 37.00    Types: Cigarettes  . Smokeless tobacco: Never Used  Substance and Sexual Activity  . Alcohol use: No    Alcohol/week: 0.0 standard drinks  . Drug use: No  . Sexual activity: Not Currently    Birth control/protection: I.U.D.    Comment: Mirena  Other Topics Concern  . Not on file  Social History Narrative  . Not on file   Social Determinants of Health   Financial Resource Strain:   . Difficulty of Paying Living Expenses: Not on file  Food Insecurity:   . Worried About Charity fundraiser in the Last Year: Not on  file  . Ran Out of Food in the Last Year: Not on file  Transportation Needs:   . Lack of Transportation (Medical): Not on  file  . Lack of Transportation (Non-Medical): Not on file  Physical Activity:   . Days of Exercise per Week: Not on file  . Minutes of Exercise per Session: Not on file  Stress:   . Feeling of Stress : Not on file  Social Connections:   . Frequency of Communication with Friends and Family: Not on file  . Frequency of Social Gatherings with Friends and Family: Not on file  . Attends Religious Services: Not on file  . Active Member of Clubs or Organizations: Not on file  . Attends Archivist Meetings: Not on file  . Marital Status: Not on file  Intimate Partner Violence:   . Fear of Current or Ex-Partner: Not on file  . Emotionally Abused: Not on file  . Physically Abused: Not on file  . Sexually Abused: Not on file    Review of Systems: 12 system ROS is negative except as noted above with the additions of vision change, depression, headaches, and insomnia. Marland Kitchen   Physical Exam: General:   Alert,  well-nourished, pleasant and cooperative in NAD Head:  Normocephalic and atraumatic. Eyes:  Sclera clear, no icterus.   Conjunctiva pink. Ears:  Normal auditory acuity. Nose:  No deformity, discharge,  or lesions. Mouth:  No deformity or lesions.   Neck:  Supple; no masses or thyromegaly. Lungs:  Clear throughout to auscultation.   No wheezes. Heart:  Regular rate and rhythm; no murmurs. Abdomen:  Soft, central obesity, nontender, nondistended, normal bowel sounds, no rebound or guarding. No hepatosplenomegaly.   Rectal:  Deferred  Msk:  Symmetrical. No boney deformities LAD: No inguinal or umbilical LAD Extremities:  No clubbing or edema. Neurologic:  Alert and  oriented x4;  grossly nonfocal Skin:  Intact without significant lesions or rashes. Psych:  Alert and cooperative. Normal mood and affect.     Lakyn Mantione L. Tarri Glenn, MD, MPH 04/03/2019, 11:03  AM

## 2019-04-05 ENCOUNTER — Ambulatory Visit (INDEPENDENT_AMBULATORY_CARE_PROVIDER_SITE_OTHER): Payer: 59 | Admitting: *Deleted

## 2019-04-05 DIAGNOSIS — I6349 Cerebral infarction due to embolism of other cerebral artery: Secondary | ICD-10-CM | POA: Diagnosis not present

## 2019-04-06 LAB — CUP PACEART REMOTE DEVICE CHECK
Date Time Interrogation Session: 20210113172608
Implantable Pulse Generator Implant Date: 20201002

## 2019-04-10 NOTE — Telephone Encounter (Signed)
May HOLD Plavix 5 days prior to procedure.   Resume plavix one day after procedure if hemostasis has been achieved.     Geryl Rankins, FNP-BC

## 2019-04-11 NOTE — Telephone Encounter (Signed)
Left message for patient to return call to office for Plavix instructions prior to procedure. Will continue efforts.

## 2019-04-12 NOTE — Telephone Encounter (Signed)
#  [phone] Pt said she is returning your call

## 2019-04-12 NOTE — Telephone Encounter (Signed)
Patient advised to her last dose of Plavix will be on 05/13/2019 prior to colonoscopy.  Patient will be instructed on when to restart after colonoscopy has been completed.  Patient agreed to plan and verbalized understanding.  No further questions.

## 2019-04-13 ENCOUNTER — Ambulatory Visit: Payer: 59 | Admitting: Obstetrics and Gynecology

## 2019-04-14 ENCOUNTER — Ambulatory Visit: Payer: 59 | Admitting: Family

## 2019-04-27 ENCOUNTER — Emergency Department (HOSPITAL_COMMUNITY): Payer: 59

## 2019-04-27 ENCOUNTER — Observation Stay (HOSPITAL_COMMUNITY)
Admission: EM | Admit: 2019-04-27 | Discharge: 2019-04-28 | Disposition: A | Discharge status: Home / Self Care | Payer: 59 | Attending: Internal Medicine | Admitting: Internal Medicine

## 2019-04-27 ENCOUNTER — Encounter (HOSPITAL_COMMUNITY): Payer: Self-pay | Admitting: Emergency Medicine

## 2019-04-27 ENCOUNTER — Other Ambulatory Visit: Payer: Self-pay

## 2019-04-27 DIAGNOSIS — I959 Hypotension, unspecified: Secondary | ICD-10-CM | POA: Diagnosis present

## 2019-04-27 DIAGNOSIS — K219 Gastro-esophageal reflux disease without esophagitis: Secondary | ICD-10-CM | POA: Insufficient documentation

## 2019-04-27 DIAGNOSIS — I130 Hypertensive heart and chronic kidney disease with heart failure and stage 1 through stage 4 chronic kidney disease, or unspecified chronic kidney disease: Secondary | ICD-10-CM | POA: Diagnosis not present

## 2019-04-27 DIAGNOSIS — Z9079 Acquired absence of other genital organ(s): Secondary | ICD-10-CM | POA: Insufficient documentation

## 2019-04-27 DIAGNOSIS — G9389 Other specified disorders of brain: Secondary | ICD-10-CM | POA: Insufficient documentation

## 2019-04-27 DIAGNOSIS — E669 Obesity, unspecified: Secondary | ICD-10-CM | POA: Insufficient documentation

## 2019-04-27 DIAGNOSIS — Z825 Family history of asthma and other chronic lower respiratory diseases: Secondary | ICD-10-CM | POA: Insufficient documentation

## 2019-04-27 DIAGNOSIS — Z1623 Resistance to quinolones and fluoroquinolones: Secondary | ICD-10-CM | POA: Diagnosis not present

## 2019-04-27 DIAGNOSIS — R42 Dizziness and giddiness: Secondary | ICD-10-CM | POA: Diagnosis present

## 2019-04-27 DIAGNOSIS — Z823 Family history of stroke: Secondary | ICD-10-CM | POA: Insufficient documentation

## 2019-04-27 DIAGNOSIS — N179 Acute kidney failure, unspecified: Secondary | ICD-10-CM | POA: Diagnosis not present

## 2019-04-27 DIAGNOSIS — B9620 Unspecified Escherichia coli [E. coli] as the cause of diseases classified elsewhere: Secondary | ICD-10-CM | POA: Insufficient documentation

## 2019-04-27 DIAGNOSIS — M1990 Unspecified osteoarthritis, unspecified site: Secondary | ICD-10-CM | POA: Insufficient documentation

## 2019-04-27 DIAGNOSIS — Z87442 Personal history of urinary calculi: Secondary | ICD-10-CM | POA: Insufficient documentation

## 2019-04-27 DIAGNOSIS — E872 Acidosis: Secondary | ICD-10-CM | POA: Insufficient documentation

## 2019-04-27 DIAGNOSIS — N3000 Acute cystitis without hematuria: Secondary | ICD-10-CM | POA: Diagnosis not present

## 2019-04-27 DIAGNOSIS — Z808 Family history of malignant neoplasm of other organs or systems: Secondary | ICD-10-CM | POA: Insufficient documentation

## 2019-04-27 DIAGNOSIS — Z91040 Latex allergy status: Secondary | ICD-10-CM | POA: Insufficient documentation

## 2019-04-27 DIAGNOSIS — Z91048 Other nonmedicinal substance allergy status: Secondary | ICD-10-CM | POA: Insufficient documentation

## 2019-04-27 DIAGNOSIS — Z6841 Body Mass Index (BMI) 40.0 and over, adult: Secondary | ICD-10-CM | POA: Diagnosis not present

## 2019-04-27 DIAGNOSIS — Z8673 Personal history of transient ischemic attack (TIA), and cerebral infarction without residual deficits: Secondary | ICD-10-CM | POA: Diagnosis not present

## 2019-04-27 DIAGNOSIS — G92 Toxic encephalopathy: Secondary | ICD-10-CM | POA: Insufficient documentation

## 2019-04-27 DIAGNOSIS — E1142 Type 2 diabetes mellitus with diabetic polyneuropathy: Secondary | ICD-10-CM | POA: Insufficient documentation

## 2019-04-27 DIAGNOSIS — Z20822 Contact with and (suspected) exposure to covid-19: Secondary | ICD-10-CM | POA: Diagnosis not present

## 2019-04-27 DIAGNOSIS — Z888 Allergy status to other drugs, medicaments and biological substances status: Secondary | ICD-10-CM | POA: Diagnosis not present

## 2019-04-27 DIAGNOSIS — Z882 Allergy status to sulfonamides status: Secondary | ICD-10-CM | POA: Diagnosis not present

## 2019-04-27 DIAGNOSIS — Z79899 Other long term (current) drug therapy: Secondary | ICD-10-CM | POA: Diagnosis not present

## 2019-04-27 DIAGNOSIS — N390 Urinary tract infection, site not specified: Secondary | ICD-10-CM | POA: Diagnosis present

## 2019-04-27 DIAGNOSIS — Z8249 Family history of ischemic heart disease and other diseases of the circulatory system: Secondary | ICD-10-CM | POA: Insufficient documentation

## 2019-04-27 DIAGNOSIS — E1122 Type 2 diabetes mellitus with diabetic chronic kidney disease: Secondary | ICD-10-CM | POA: Insufficient documentation

## 2019-04-27 DIAGNOSIS — Z7902 Long term (current) use of antithrombotics/antiplatelets: Secondary | ICD-10-CM | POA: Diagnosis not present

## 2019-04-27 DIAGNOSIS — E1136 Type 2 diabetes mellitus with diabetic cataract: Secondary | ICD-10-CM | POA: Insufficient documentation

## 2019-04-27 DIAGNOSIS — Z794 Long term (current) use of insulin: Secondary | ICD-10-CM | POA: Insufficient documentation

## 2019-04-27 DIAGNOSIS — F17210 Nicotine dependence, cigarettes, uncomplicated: Secondary | ICD-10-CM | POA: Insufficient documentation

## 2019-04-27 DIAGNOSIS — N183 Chronic kidney disease, stage 3 unspecified: Secondary | ICD-10-CM | POA: Diagnosis not present

## 2019-04-27 DIAGNOSIS — G9341 Metabolic encephalopathy: Secondary | ICD-10-CM | POA: Diagnosis present

## 2019-04-27 DIAGNOSIS — I509 Heart failure, unspecified: Secondary | ICD-10-CM | POA: Insufficient documentation

## 2019-04-27 DIAGNOSIS — Z811 Family history of alcohol abuse and dependence: Secondary | ICD-10-CM | POA: Insufficient documentation

## 2019-04-27 DIAGNOSIS — Z87440 Personal history of urinary (tract) infections: Secondary | ICD-10-CM | POA: Insufficient documentation

## 2019-04-27 DIAGNOSIS — Z818 Family history of other mental and behavioral disorders: Secondary | ICD-10-CM | POA: Insufficient documentation

## 2019-04-27 DIAGNOSIS — G43909 Migraine, unspecified, not intractable, without status migrainosus: Secondary | ICD-10-CM | POA: Insufficient documentation

## 2019-04-27 DIAGNOSIS — F319 Bipolar disorder, unspecified: Secondary | ICD-10-CM | POA: Diagnosis not present

## 2019-04-27 DIAGNOSIS — Z833 Family history of diabetes mellitus: Secondary | ICD-10-CM | POA: Insufficient documentation

## 2019-04-27 DIAGNOSIS — Z8349 Family history of other endocrine, nutritional and metabolic diseases: Secondary | ICD-10-CM | POA: Insufficient documentation

## 2019-04-27 DIAGNOSIS — G4730 Sleep apnea, unspecified: Secondary | ICD-10-CM | POA: Insufficient documentation

## 2019-04-27 LAB — URINALYSIS, ROUTINE W REFLEX MICROSCOPIC
Bilirubin Urine: NEGATIVE
Glucose, UA: NEGATIVE mg/dL
Ketones, ur: NEGATIVE mg/dL
Nitrite: POSITIVE — AB
Protein, ur: 30 mg/dL — AB
Specific Gravity, Urine: 1.015 (ref 1.005–1.030)
WBC, UA: >50 WBC/hpf — ABNORMAL HIGH (ref 0–5)
pH: 5 (ref 5.0–8.0)

## 2019-04-27 LAB — CBC
HCT: 44.1 % (ref 36.0–46.0)
Hemoglobin: 14.2 g/dL (ref 12.0–15.0)
MCH: 32.2 pg (ref 26.0–34.0)
MCHC: 32.2 g/dL (ref 30.0–36.0)
MCV: 100 fL (ref 80.0–100.0)
Platelets: 342 10*3/uL (ref 150–400)
RBC: 4.41 MIL/uL (ref 3.87–5.11)
RDW: 12.4 % (ref 11.5–15.5)
WBC: 12.1 10*3/uL — ABNORMAL HIGH (ref 4.0–10.5)
nRBC: 0 % (ref 0.0–0.2)

## 2019-04-27 LAB — BASIC METABOLIC PANEL
Anion gap: 16 — ABNORMAL HIGH (ref 5–15)
BUN: 24 mg/dL — ABNORMAL HIGH (ref 6–20)
CO2: 21 mmol/L — ABNORMAL LOW (ref 22–32)
Calcium: 10.4 mg/dL — ABNORMAL HIGH (ref 8.9–10.3)
Chloride: 101 mmol/L (ref 98–111)
Creatinine, Ser: 1.61 mg/dL — ABNORMAL HIGH (ref 0.44–1.00)
GFR calc Af Amer: 42 mL/min — ABNORMAL LOW (ref >60)
GFR calc non Af Amer: 36 mL/min — ABNORMAL LOW (ref >60)
Glucose, Bld: 189 mg/dL — ABNORMAL HIGH (ref 70–99)
Potassium: 3.2 mmol/L — ABNORMAL LOW (ref 3.5–5.1)
Sodium: 138 mmol/L (ref 135–145)

## 2019-04-27 LAB — MAGNESIUM: Magnesium: 2 mg/dL (ref 1.7–2.4)

## 2019-04-27 LAB — RAPID URINE DRUG SCREEN, HOSP PERFORMED
Amphetamines: NOT DETECTED
Barbiturates: NOT DETECTED
Benzodiazepines: NOT DETECTED
Cocaine: NOT DETECTED
Opiates: NOT DETECTED
Tetrahydrocannabinol: NOT DETECTED

## 2019-04-27 LAB — I-STAT BETA HCG BLOOD, ED (MC, WL, AP ONLY): I-stat hCG, quantitative: <5 m[IU]/mL (ref <5)

## 2019-04-27 LAB — CBG MONITORING, ED: Glucose-Capillary: 110 mg/dL — ABNORMAL HIGH (ref 70–99)

## 2019-04-27 LAB — LACTIC ACID, PLASMA: Lactic Acid, Venous: 1.9 mmol/L (ref 0.5–1.9)

## 2019-04-27 IMAGING — CT CT HEAD W/O CM
4 series · 15 of 47 positions shown, 17 images · non-contrast
Comparison: Head CT dated [DATE].

CLINICAL DATA: 53-year-old female with ataxia.  Concern for stroke.

EXAM:
CT HEAD WITHOUT CONTRAST
TECHNIQUE: Contiguous axial images were obtained from the base of the skull
through the vertex without intravenous contrast.

[Series 3: head (person_name) · axial · 0.34mm/px · z∈[+1331,+1462]mm · 7 of 37 slices shown, 9 images]
[im 5/37  brain]
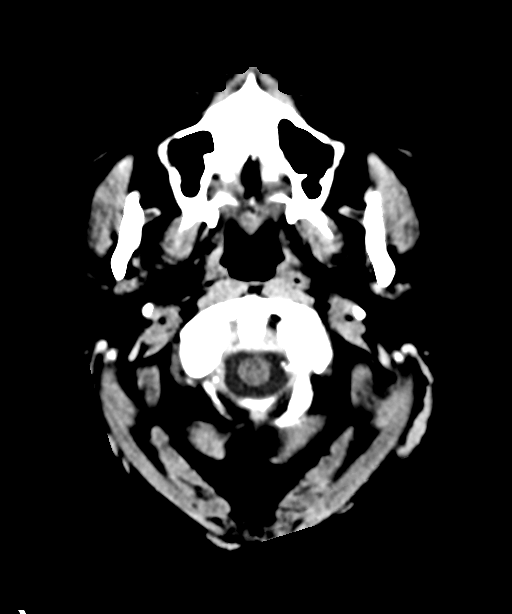
[im 5/37  bone]
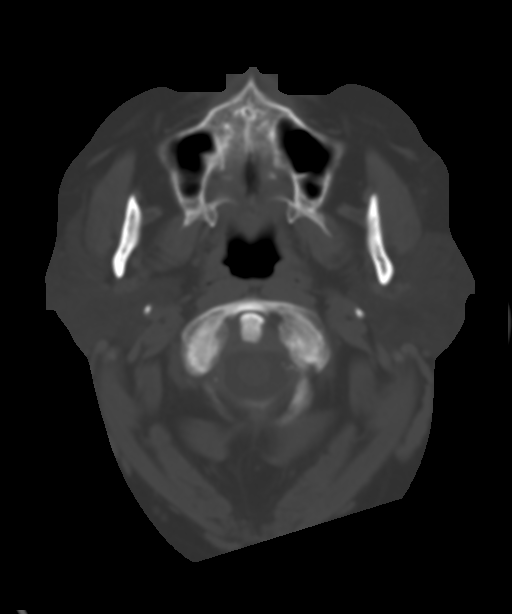
[im 10/37  brain]
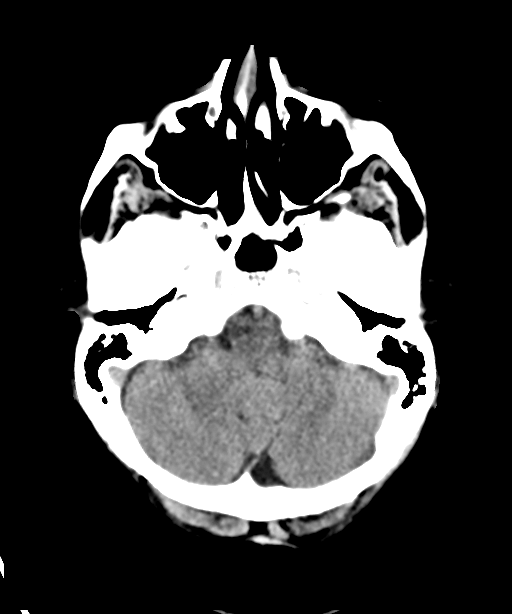
[im 14/37  brain]
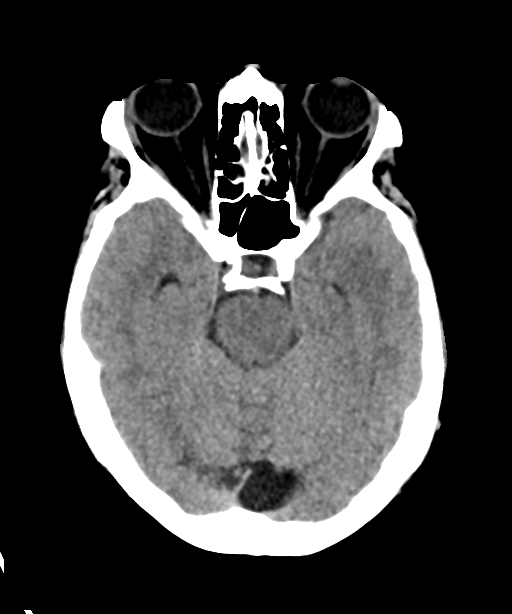
[im 19/37  brain]
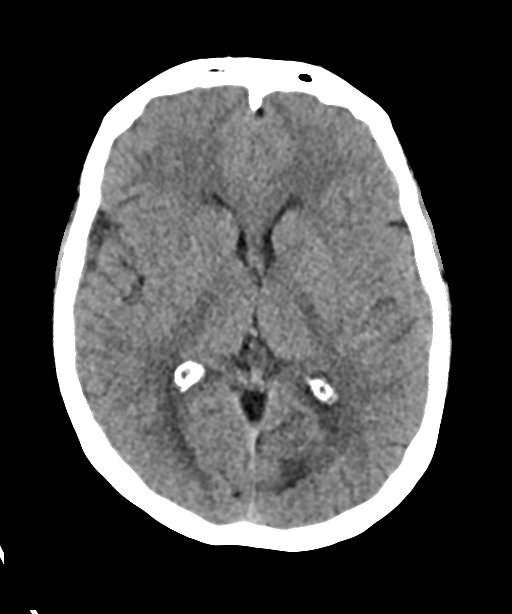
[im 23/37  brain]
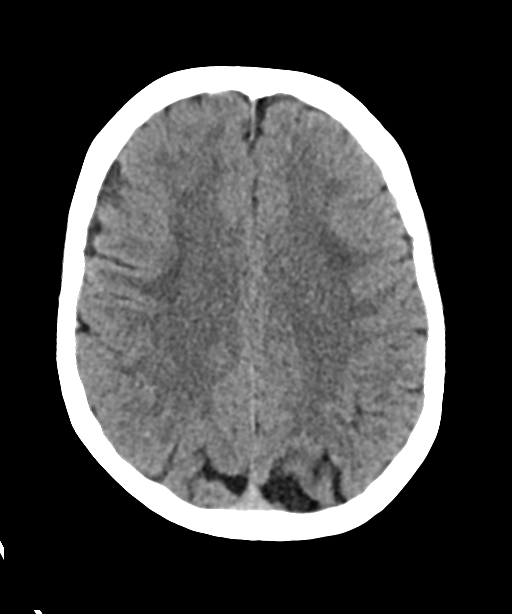
[im 23/37  bone]
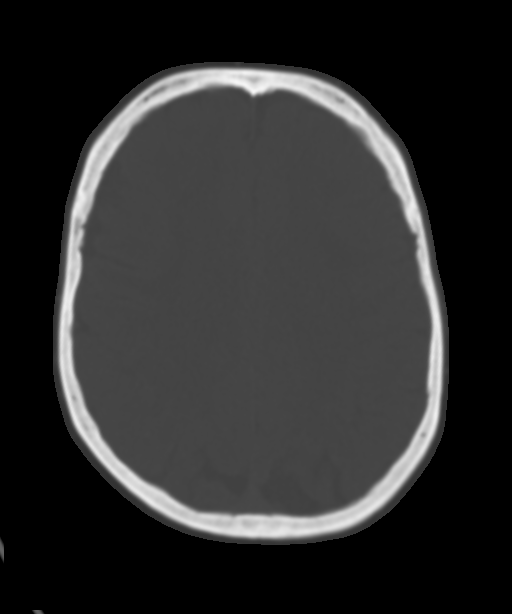
[im 28/37  brain]
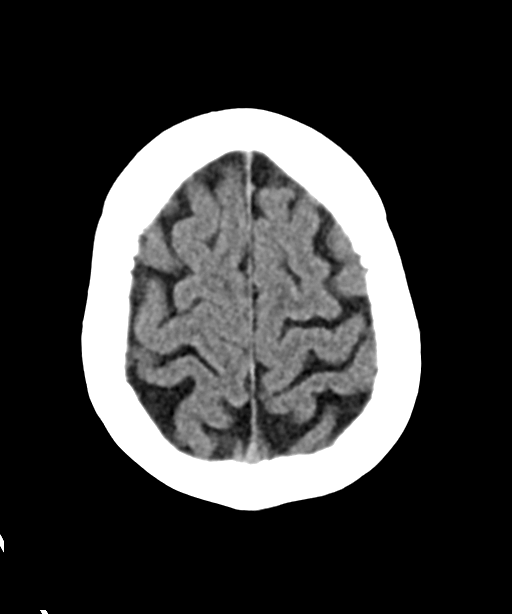
[im 32/37  brain]
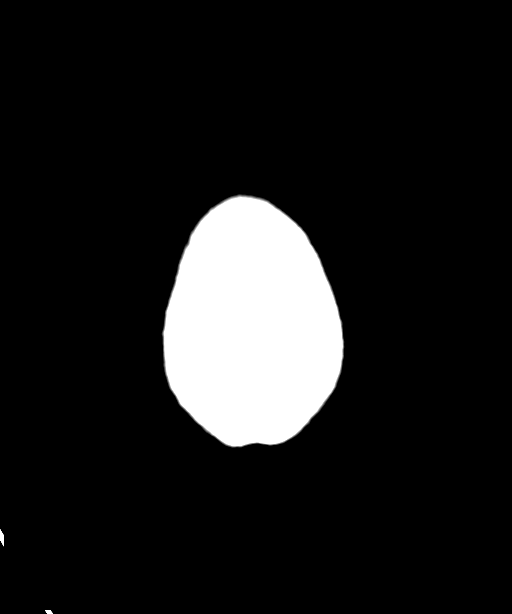

[Series 4: head bone (person_name) · axial · 0.42mm/px · z∈[+1300,+1318]mm · 2 of 88 slices shown]
[im 9/88  bone]
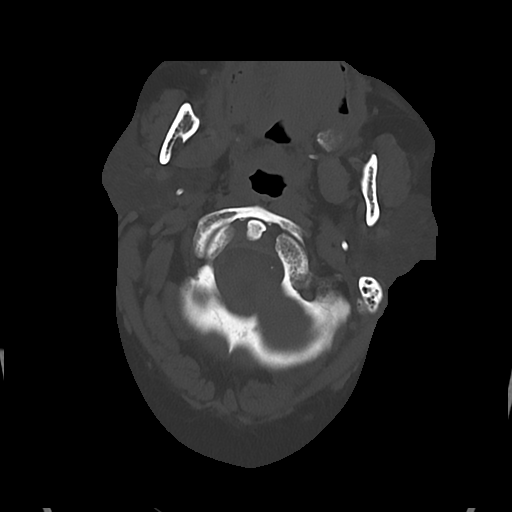
[im 18/88  bone]
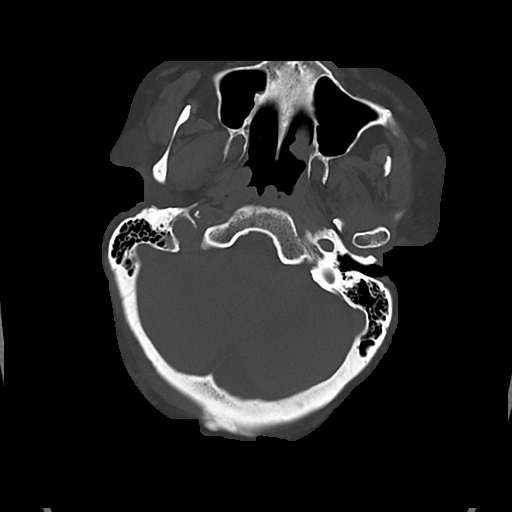

[Series 5: cor soft (person_name) · coronal · 0.34mm/px · 3 of 71 slices shown]
[im 24/71  brain]
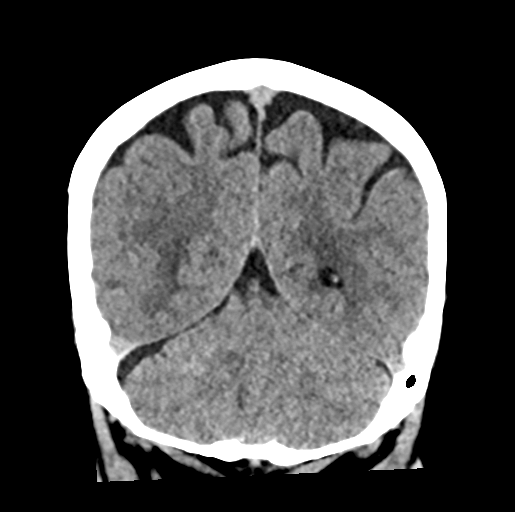
[im 32/71  brain]
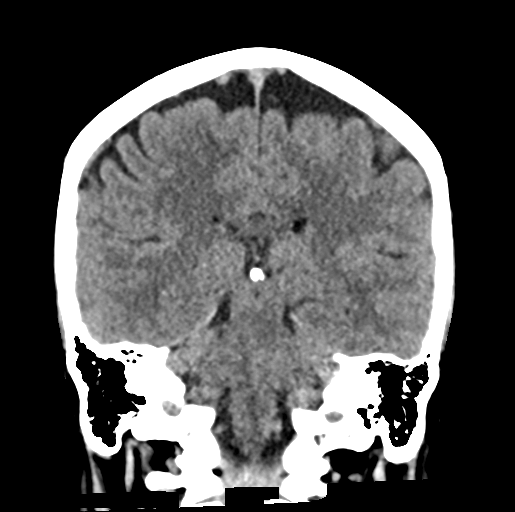
[im 39/71  brain]
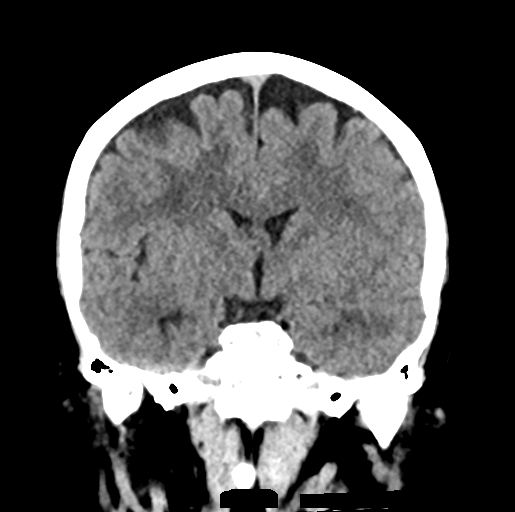

[Series 6: sag soft (person_name) · sagittal · 0.34mm/px · 3 of 59 slices shown]
[im 20/59  brain]
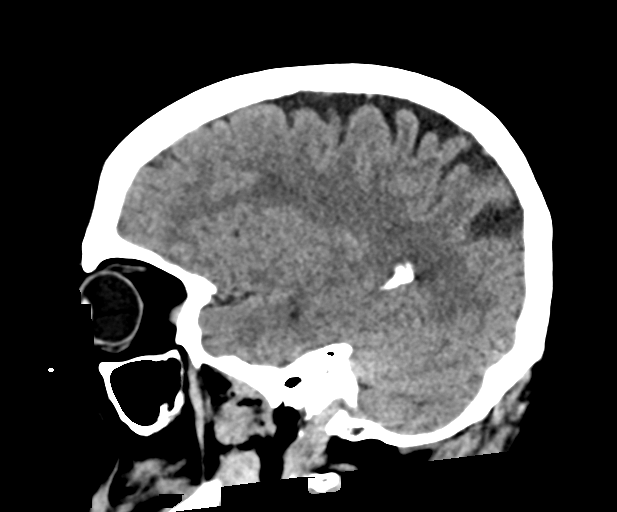
[im 30/59  brain]
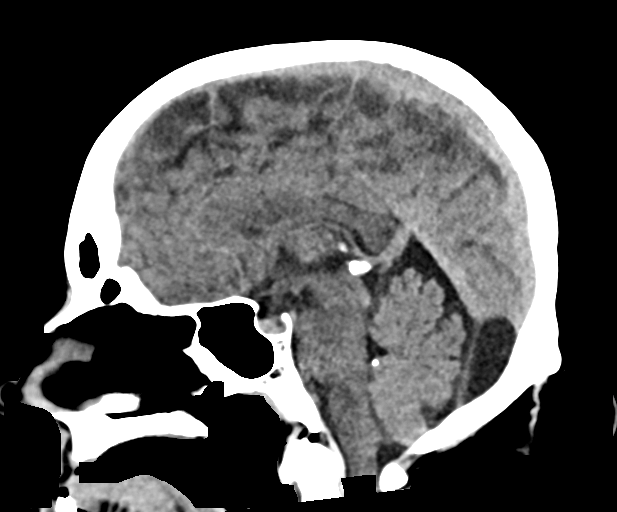
[im 39/59  brain]
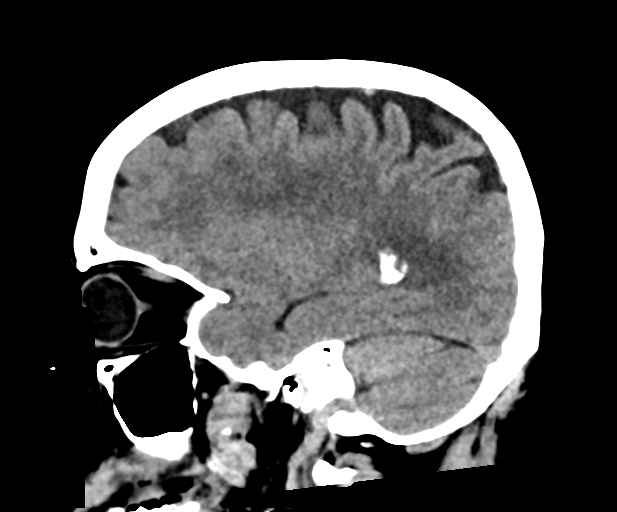

[15 of 47 positions shown; findings below may reference images not displayed]

FINDINGS: Brain: There is mild age-related atrophy and chronic microvascular
ischemic changes. A subcentimeter hypodense focus in the left corona
radiata (series 3, image 17) appears new since the prior CT but
likely represents a chronic or less likely a subacute ischemia.
Clinical correlation is recommended. There is no acute intracranial
hemorrhage. No mass effect or midline shift. No extra-axial fluid
collection.

Vascular: No hyperdense vessel or unexpected calcification.

Skull: Normal. Negative for fracture or focal lesion.

Sinuses/Orbits: No acute finding.

Other: None
IMPRESSION: 1. No acute intracranial hemorrhage.
2. Age-related atrophy and chronic microvascular ischemic changes.
Probable old lacunar infarct of the left corona radiata.

## 2019-04-27 MED ORDER — CRANBERRY 250 MG PO CHEW: CHEWABLE_TABLET | Freq: Every day | ORAL | Status: DC

## 2019-04-27 MED ORDER — ACETAMINOPHEN 650 MG RE SUPP: 650.0000 mg | Freq: Four times a day (QID) | RECTAL | Status: DC | PRN

## 2019-04-27 MED ORDER — ACETAMINOPHEN 325 MG PO TABS
650.0000 mg | ORAL_TABLET | Freq: Four times a day (QID) | ORAL | Status: DC | PRN
  Administered 2019-04-28 (×2): 650 mg via ORAL
  Filled 2019-04-27 (×2): qty 2

## 2019-04-27 MED ORDER — ATORVASTATIN CALCIUM 80 MG PO TABS: 80.0000 mg | ORAL_TABLET | Freq: Every day | ORAL | Status: DC

## 2019-04-27 MED ORDER — POLYETHYLENE GLYCOL 3350 17 G PO PACK: 17.0000 g | PACK | Freq: Every day | ORAL | Status: DC | PRN

## 2019-04-27 MED ORDER — ENOXAPARIN SODIUM 40 MG/0.4ML ~~LOC~~ SOLN
40.0000 mg | SUBCUTANEOUS | Status: DC
  Administered 2019-04-27: 40 mg via SUBCUTANEOUS
  Filled 2019-04-27: qty 0.4

## 2019-04-27 MED ORDER — LORAZEPAM 2 MG/ML IJ SOLN
1.0000 mg | INTRAMUSCULAR | Status: AC
  Administered 2019-04-27: 1 mg via INTRAVENOUS
  Filled 2019-04-27: qty 1

## 2019-04-27 MED ORDER — SODIUM CHLORIDE 0.9 % IV SOLN
1.0000 g | INTRAVENOUS | Status: DC
  Administered 2019-04-27: 1 g via INTRAVENOUS
  Filled 2019-04-27 (×2): qty 10

## 2019-04-27 MED ORDER — CLOPIDOGREL BISULFATE 75 MG PO TABS
75.0000 mg | ORAL_TABLET | Freq: Every day | ORAL | Status: DC
  Administered 2019-04-28: 75 mg via ORAL
  Filled 2019-04-27: qty 1

## 2019-04-27 MED ORDER — INSULIN GLARGINE 100 UNIT/ML ~~LOC~~ SOLN
15.0000 [IU] | Freq: Every day | SUBCUTANEOUS | Status: DC
  Administered 2019-04-27: 15 [IU] via SUBCUTANEOUS
  Filled 2019-04-27 (×2): qty 0.15

## 2019-04-27 MED ORDER — SENNOSIDES-DOCUSATE SODIUM 8.6-50 MG PO TABS: 2.0000 | ORAL_TABLET | Freq: Every evening | ORAL | Status: DC | PRN

## 2019-04-27 MED ORDER — BISACODYL 10 MG RE SUPP: 10.0000 mg | Freq: Every day | RECTAL | Status: DC | PRN

## 2019-04-27 MED ORDER — ONDANSETRON HCL 4 MG PO TABS: 4.0000 mg | ORAL_TABLET | Freq: Three times a day (TID) | ORAL | Status: DC | PRN

## 2019-04-27 MED ORDER — SODIUM CHLORIDE 0.9 % IV BOLUS
30.0000 mL/kg | Freq: Once | INTRAVENOUS | Status: AC
  Administered 2019-04-27: 2910 mL via INTRAVENOUS

## 2019-04-27 MED ORDER — SODIUM CHLORIDE 0.9% FLUSH: 3.0000 mL | Freq: Once | INTRAVENOUS | Status: DC

## 2019-04-27 MED ORDER — SODIUM CHLORIDE 0.9% FLUSH
3.0000 mL | Freq: Two times a day (BID) | INTRAVENOUS | Status: DC
  Administered 2019-04-28: 3 mL via INTRAVENOUS

## 2019-04-27 MED ORDER — TOPIRAMATE 100 MG PO TABS
100.0000 mg | ORAL_TABLET | Freq: Two times a day (BID) | ORAL | Status: DC
  Administered 2019-04-27 – 2019-04-28 (×2): 100 mg via ORAL
  Filled 2019-04-27: qty 1
  Filled 2019-04-27: qty 4

## 2019-04-27 MED ORDER — POTASSIUM CHLORIDE CRYS ER 20 MEQ PO TBCR
40.0000 meq | EXTENDED_RELEASE_TABLET | Freq: Once | ORAL | Status: AC
  Administered 2019-04-27: 40 meq via ORAL
  Filled 2019-04-27: qty 2

## 2019-04-27 NOTE — ED Notes (Signed)
ED TO INPATIENT HANDOFF REPORT  ED Nurse Name and Phone #: Kira Hartl  S Name/Age/Gender Pamela Carney 54 y.o. female Room/Bed: 046C/046C  Code Status   Code Status: Full Code  Home/SNF/Other Home Patient oriented to: self, place, time and situation Is this baseline? Yes   Triage Complete: Triage complete  Chief Complaint UTI (urinary tract infection) [N39.0]  Triage Note Pt arrives to ED from home with complaints of a headache, dizziness, tingling, and brain fog for the past 3-4 weeks. Patient states she just feels "weird and finds herself calling her kids her dogs name."  Patient states that there is no acute change today just felt like she needed to get checked out. Patient has hx of stroke.     Allergies Allergies  Allergen Reactions  . Sulfa Antibiotics Hives and Swelling    Swelling site not recalled  . Latex Rash  . Tape Rash    Not tolerated well    Level of Care/Admitting Diagnosis ED Disposition    ED Disposition Condition [facility] Area: [place] [[id number]]  Level of Care: Med-Surg [16]  I expect the patient will be discharged within 24 hours: No (not a candidate for 5C-Observation unit)  Covid Evaluation: Asymptomatic Screening Protocol (No Symptoms)  Diagnosis: UTI (urinary tract infection) [id number]  Admitting Physician: Margart Sickles [id number]  Attending Physician: Margart Sickles [phone]       B Medical/Surgery History Past Medical History:  Diagnosis Date  . Abnormal Pap smear   . AKI (acute kidney injury) ([place]) 12/28/2017  . Anxiety   . Arthritis    bil knees, neck  . Bipolar 1 disorder ([place])   . Cataract    Mild  . Cholelithiasis 12/23/2017   noted on CT renal, pt unaware  . Chronic kidney disease (CKD), stage III (moderate)   . Cystitis   . Depression   . Diabetes mellitus without complication ([place])    type 2  . Genital warts    Hx of genital  . GERD (gastroesophageal reflux disease)    . Heart murmur    childhood  . Hematuria   . History of blood transfusion   . History of ectopic pregnancy   . History of gestational diabetes   . History of kidney stones   . History of sepsis    after ectopic pregnancy  . Idiopathic peripheral neuropathy    both feet  . Leg ulcer, left ([place])   . Low back pain   . Migraines   . Obesity   . Oral candidiasis   . Pneumonia   . PONV (postoperative nausea and vomiting)    prolonged sedation  . Recurrent UTI   . 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 Sigmoid diverticulosis 12/23/2017   noted on CT renal, pt unaware  . Sleep apnea    uses cpap  . 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 Ulcer of foot ([place])    Left  . Weakness    Past Surgical History:  Procedure Laterality Date  . BUBBLE STUDY  12/23/2018   Procedure: BUBBLE STUDY;  Surgeon: Lelon Perla, MD;  Location: [facility] ENDOSCOPY;  Service: Cardiovascular;;  . CERVICAL CONE BIOPSY    . CESAREAN SECTION     x3  . CYSTOSCOPY W/ URETERAL STENT PLACEMENT Right 12/15/2018   Procedure: Cystoscopy, right retrograde ureteropyelogram, fluoroscopic interpretation, right  double-J stent placement (24 cm x 6 [place]);  Surgeon: Franchot Gallo, MD;  Location: [facility];  Service: Urology;  Laterality: Right;  . CYSTOSCOPY WITH RETROGRADE PYELOGRAM, URETEROSCOPY AND STENT PLACEMENT Right 01/27/2019   Procedure: CYSTOSCOPY WITH RETROGRADE PYELOGRAM, URETEROSCOPY AND STENT PLACEMENT; BASKET STONE RETRIEVAL;  Surgeon: Alexis Frock, MD;  Location: [facility];  Service: Urology;  Laterality: Right;  75 MINS  . CYSTOSCOPY/URETEROSCOPY/HOLMIUM LASER/STENT PLACEMENT Right 02/04/2018   Procedure: CYSTOSCOPY/URETEROSCOPY/RETROGRADE PYELOGRAM/HOLMIUM LASER/STENT PLACEMENT;  Surgeon: Alexis Frock, MD;  Location: [facility];  Service: Urology;  Laterality: Right;  . CYSTOSCOPY/URETEROSCOPY/HOLMIUM LASER/STENT PLACEMENT Right  02/07/2018   Procedure: CYSTOSCOPY LEFT STENT EXCHANGE;  Surgeon: Alexis Frock, MD;  Location: [facility];  Service: Urology;  Laterality: Right;  . DILATION AND CURETTAGE OF UTERUS    . ECTOPIC PREGNANCY SURGERY    . IR NEPHROSTOMY PLACEMENT LEFT  12/25/2017  . LOOP RECORDER INSERTION N/A 12/23/2018   Procedure: LOOP RECORDER INSERTION;  Surgeon: Constance Haw, MD;  Location: [facility] CV LAB;  Service: Cardiovascular;  Laterality: N/A;  . NEPHROLITHOTOMY Left 02/04/2018   Procedure: NEPHROLITHOTOMY PERCUTANEOUS;  Surgeon: Alexis Frock, MD;  Location: [facility];  Service: Urology;  Laterality: Left;  3 HRS  . NEPHROLITHOTOMY Left 02/07/2018   Procedure: SECOND LOOK NEPHROLITHOTOMY PERCUTANEOUS;  Surgeon: Alexis Frock, MD;  Location: [facility];  Service: Urology;  Laterality: Left;  2 HRS  . OVARIAN CYST REMOVAL    . TEE WITHOUT CARDIOVERSION N/A 12/23/2018   Procedure: TRANSESOPHAGEAL ECHOCARDIOGRAM (TEE);  Surgeon: Lelon Perla, MD;  Location: [facility] ENDOSCOPY;  Service: Cardiovascular;  Laterality: N/A;  . UNILATERAL SALPINGECTOMY     Pt unsure of which fallopian tube was removed  . WISDOM TOOTH EXTRACTION       A IV Location/Drains/Wounds Patient Lines/Drains/Airways Status   Active Line/Drains/Airways    Name:   Placement date:   Placement time:   Site:   Days:   Peripheral IV 04/27/19 Left Wrist   04/27/19    1947    Wrist   less than 1   Ureteral Drain/Stent Right ureter 5 Fr.   01/27/19    0913    Right ureter   90   Incision (Closed) 12/23/18 Chest Mid   12/23/18    2011     125   Incision (Closed) 01/27/19 Perineum Other (Comment)   01/27/19    0900     90          Intake/Output Last 24 hours No intake or output data in the 24 hours ending 04/27/19 2313  Labs/Imaging Results for orders placed or performed during the hospital encounter of 04/27/19 (from the past 48 hour(s))  Basic metabolic panel     Status: Abnormal   Collection Time: 04/27/19  4:47 PM  Result  Value Ref Range   Sodium 138 135 - 145 mmol/L   Potassium 3.2 (L) 3.5 - 5.1 mmol/L   Chloride 101 98 - 111 mmol/L   CO2 21 (L) 22 - 32 mmol/L   Glucose, Bld 189 (H) 70 - 99 mg/dL   BUN 24 (H) 6 - 20 mg/dL   Creatinine, Ser 1.61 (H) 0.44 - 1.00 mg/dL   Calcium 10.4 (H) 8.9 - 10.3 mg/dL   GFR calc non Af Amer 36 (L) >60 mL/min   GFR calc Af Amer 42 (L) >60 mL/min   Anion gap 16 (H) 5 - 15    Comment: Performed at [facility], [address],  [place] [address]  CBC     Status: Abnormal   Collection Time: 04/27/19  4:47 PM  Result Value Ref Range   WBC 12.1 (H) 4.0 - 10.5 K/uL   RBC 4.41 3.87 - 5.11 MIL/uL   Hemoglobin 14.2 12.0 - 15.0 g/dL   HCT 44.1 36.0 - 46.0 %   MCV 100.0 80.0 - 100.0 fL   MCH 32.2 26.0 - 34.0 pg   MCHC 32.2 30.0 - 36.0 g/dL   RDW 12.4 11.5 - 15.5 %   Platelets 342 150 - 400 K/uL   nRBC 0.0 0.0 - 0.2 %    Comment: Performed at [facility], [place] [address] [place], [place] [address]  Urinalysis, Routine w reflex microscopic     Status: Abnormal   Collection Time: 04/27/19  6:04 PM  Result Value Ref Range   Color, Urine YELLOW YELLOW   APPearance CLOUDY (A) CLEAR   Specific Gravity, Urine 1.015 1.005 - 1.030   pH 5.0 5.0 - 8.0   Glucose, UA NEGATIVE NEGATIVE mg/dL   Hgb urine dipstick SMALL (A) NEGATIVE   Bilirubin Urine NEGATIVE NEGATIVE   Ketones, ur NEGATIVE NEGATIVE mg/dL   Protein, ur 30 (A) NEGATIVE mg/dL   Nitrite POSITIVE (A) NEGATIVE   Leukocytes,Ua LARGE (A) NEGATIVE   RBC / HPF 6-10 0 - 5 RBC/hpf   WBC, UA >50 (H) 0 - 5 WBC/hpf   Bacteria, UA MANY (A) NONE SEEN   Squamous Epithelial / LPF 6-10 0 - 5   Mucus PRESENT     Comment: Performed at [facility], [place] [address], [place], [place]  [address]  Magnesium     Status: None   Collection Time: 04/27/19  6:10 PM  Result Value Ref Range   Magnesium 2.0 1.7 - 2.4 mg/dL    Comment: Performed at [facility] [address], [place], [place] [address]  Lactic  acid, plasma     Status: None   Collection Time: 04/27/19  7:43 PM  Result Value Ref Range   Lactic Acid, Venous 1.9 0.5 - 1.9 mmol/L    Comment: Performed at [facility] [address], [place], [place] [address]  I-Stat beta hCG blood, ED     Status: None   Collection Time: 04/27/19  8:12 PM  Result Value Ref Range   I-stat hCG, quantitative <5.0 <5 mIU/mL   Comment 3            Comment:   GEST. AGE      CONC.  (mIU/mL)   <=1 WEEK        5 - 50     2 WEEKS       50 - 500     3 WEEKS       100 - 10,000     4 WEEKS     1,000 - 30,000        FEMALE AND NON-PREGNANT FEMALE:     LESS THAN 5 mIU/mL   CBG monitoring, ED     Status: Abnormal   Collection Time: 04/27/19 10:45 PM  Result Value Ref Range   Glucose-Capillary 110 (H) 70 - 99 mg/dL   CT Head Wo Contrast  Result Date: 04/27/2019 CLINICAL DATA:  54 year old female with ataxia.  Concern for stroke. EXAM: CT HEAD WITHOUT CONTRAST TECHNIQUE: Contiguous axial images were obtained from the base of the skull through the vertex without intravenous contrast. COMPARISON:  Head CT dated 12/21/2018. FINDINGS: Brain:  There is mild age-related atrophy and chronic microvascular ischemic changes. A subcentimeter hypodense focus in the left corona radiata (series 3, image 17) appears new since the prior CT but likely represents a chronic or less likely a subacute ischemia. Clinical correlation is recommended. There is no acute intracranial hemorrhage. No mass effect or midline shift. No extra-axial fluid collection. Vascular: No hyperdense vessel or unexpected calcification. Skull: Normal. Negative for fracture or focal lesion. Sinuses/Orbits: No acute finding. Other: None IMPRESSION: 1. No acute intracranial hemorrhage. 2. Age-related atrophy and chronic microvascular ischemic changes. Probable old lacunar infarct of the left corona radiata. Electronically Signed   By: Anner Crete M.D.   On: 04/27/2019 19:48   MR BRAIN WO CONTRAST  Result  Date: 04/27/2019 CLINICAL DATA:  Headache and dizziness EXAM: MRI HEAD WITHOUT CONTRAST TECHNIQUE: Multiplanar, multiecho pulse sequences of the brain and surrounding structures were obtained without intravenous contrast. COMPARISON:  12/21/2018 FINDINGS: BRAIN: Encephalomalacia in the left occipital medial temporal lobe at the site of prior infarct. No acute ischemia. Old left corona radiata small vessel infarct. Multifocal white matter hyperintensity, most commonly due to chronic ischemic microangiopathy. Normal volume of brain parenchyma and CSF spaces. midline structures are normal. VASCULAR: Major flow voids are preserved. Susceptibility-sensitive sequences show no chronic microhemorrhage or superficial siderosis. SKULL AND UPPER CERVICAL SPINE: Normal calvarium and skull base. Visualized upper cervical spine and soft tissues are normal. SINUSES/ORBITS: No fluid levels or advanced mucosal thickening. No mastoid or middle ear effusion. Normal orbits. IMPRESSION: 1. No acute intracranial abnormality. 2. Old left PCA territory and left corona radiata small vessel infarcts. 3. Chronic small vessel ischemia. Electronically Signed   By: Ulyses Jarred M.D.   On: 04/27/2019 21:35    Pending Labs Unresulted Labs (From admission, onward)    Start     Ordered   05/04/19 0500  Creatinine, serum  (enoxaparin (LOVENOX)    CrCl >/= 30 ml/min)  Weekly,   R    Comments: while on enoxaparin therapy    04/27/19 2241   04/28/19 0500  Comprehensive metabolic panel  Daily,   R     04/27/19 2241   04/28/19 0500  CBC  Daily,   R     04/27/19 2241   04/27/19 2242  HIV Antibody (routine testing w rflx)  (HIV Antibody (Routine testing w reflex) panel)  Once,   STAT     04/27/19 2241   04/27/19 2242  CBC  (enoxaparin (LOVENOX)    CrCl >/= 30 ml/min)  Once,   STAT    Comments: Baseline for enoxaparin therapy IF NOT ALREADY DRAWN.  Notify MD if PLT < 100 K.    04/27/19 2241   04/27/19 2242  Creatinine, serum  (enoxaparin  (LOVENOX)    CrCl >/= 30 ml/min)  Once,   STAT    Comments: Baseline for enoxaparin therapy IF NOT ALREADY DRAWN.    04/27/19 2241   04/27/19 2242  Magnesium  Once,   STAT     04/27/19 2241   04/27/19 2242  Phosphorus  Once,   STAT     04/27/19 2241   04/27/19 2242  TSH  Once,   STAT     04/27/19 2241   04/27/19 2242  Hemoglobin A1c  Once,   STAT     04/27/19 2241   04/27/19 2242  Ammonia  Once,   STAT     04/27/19 2241   04/27/19 2242  Urine rapid drug screen (hosp performed)  ONCE -  STAT,   STAT     04/27/19 2241   04/27/19 1914  SARS CORONAVIRUS 2 (TAT 6-24 HRS) Nasopharyngeal Nasopharyngeal Swab  (Tier 3 (TAT 6-24 hrs))  Once,   STAT    Question Answer Comment  Is this test for diagnosis or screening Screening   Symptomatic for COVID-19 as defined by CDC No   Hospitalized for COVID-19 No   Admitted to ICU for COVID-19 No   Previously tested for COVID-19 Yes   Resident in a congregate (group) care setting Unknown   Employed in healthcare setting Unknown   Pregnant No      04/27/19 1914   04/27/19 1912  Urine culture  ONCE - STAT,   STAT     04/27/19 1911   04/27/19 1912  Lactic acid, plasma  Now then every 2 hours,   STAT     04/27/19 1911   04/27/19 1912  Culture, blood (routine x 2)  BLOOD CULTURE X 2,   STAT     04/27/19 1911          Vitals/Pain Today's Vitals   04/27/19 1900 04/27/19 1908 04/27/19 2030 04/27/19 2230  BP: (!) 83/52  90/64 (!) 81/69  Pulse: 80  74 (!) 42  Resp: 19  19 16   Temp:      TempSrc:      SpO2: 94%  93% 96%  Weight:  97 kg    PainSc:        Isolation Precautions No active isolations  Medications Medications  sodium chloride flush (NS) 0.9 % injection 3 mL (has no administration in time range)  cefTRIAXone (ROCEPHIN) 1 g in sodium chloride 0.9 % 100 mL IVPB (0 g Intravenous Stopped 04/27/19 2118)  atorvastatin (LIPITOR) tablet 80 mg (has no administration in time range)  insulin glargine (LANTUS) injection 15 Units (has no  administration in time range)  ondansetron (ZOFRAN) tablet 4 mg (has no administration in time range)  senna-docusate (Senokot-S) tablet 2 tablet (has no administration in time range)  clopidogrel (PLAVIX) tablet 75 mg (has no administration in time range)  topiramate (TOPAMAX) tablet 100 mg (100 mg Oral Given 04/27/19 2254)  enoxaparin (LOVENOX) injection 40 mg (has no administration in time range)  sodium chloride flush (NS) 0.9 % injection 3 mL (has no administration in time range)  acetaminophen (TYLENOL) tablet 650 mg (has no administration in time range)    Or  acetaminophen (TYLENOL) suppository 650 mg (has no administration in time range)  polyethylene glycol (MIRALAX / GLYCOLAX) packet 17 g (has no administration in time range)  bisacodyl (DULCOLAX) suppository 10 mg (has no administration in time range)  potassium chloride SA (KLOR-CON) CR tablet 40 mEq (40 mEq Oral Given 04/27/19 1831)  sodium chloride 0.9 % bolus 2,910 mL (2,910 mLs Intravenous New Bag/Given 04/27/19 2005)  LORazepam (ATIVAN) injection 1 mg (1 mg Intravenous Given 04/27/19 2036)    Mobility walks with person assist Low fall risk   Focused Assessments      R Recommendations: See Admitting Provider Note  Report given to:   Additional Notes:

## 2019-04-27 NOTE — ED Notes (Signed)
  Pt transported to ct 

## 2019-04-27 NOTE — ED Provider Notes (Signed)
[facility] EMERGENCY DEPARTMENT Provider Note   CSN: [id number] Arrival date & time: 04/27/19  1623     History Chief Complaint  Patient presents with  . Headache  . Dizziness    Pamela Carney is a 54 y.o. female with a past medical history of bipolar 1, obesity, migraines, cryptogenic stroke, CKD 3, who presents today for evaluation of headache, dizziness, tingling, and brain fog for the past 3 to 4 weeks. She reports that she went to urgent care earlier today where they were concerned she may have had a stroke and sent her here.  She denies any known sick contacts.  She reports that she has been feeling confused and doing thinks like mixing up her children and dogs names.   No fevers.    She has a vision deficit at baseline from previous stroke without any change.  She denies any trauma recently.    No aggravating or alleviating factors noted.    HPI     Past Medical History:  Diagnosis Date  . Abnormal Pap smear   . AKI (acute kidney injury) ([facility]) 12/28/2017  . Anxiety   . Arthritis    bil knees, neck  . Bipolar 1 disorder ([place])   . Cataract    Mild  . Cholelithiasis 12/23/2017   noted on CT renal, pt unaware  . Chronic kidney disease (CKD), stage III (moderate)   . Cystitis   . Depression   . Diabetes mellitus without complication ([place])    type 2  . Genital warts    Hx of genital  . GERD (gastroesophageal reflux disease)   . Heart murmur    childhood  . Hematuria   . History of blood transfusion   . History of ectopic pregnancy   . History of gestational diabetes   . History of kidney stones   . History of sepsis    after ectopic pregnancy  . Idiopathic peripheral neuropathy    both feet  . Leg ulcer, left ([place])   . Low back pain   . Migraines   . Obesity   . Oral candidiasis   . Pneumonia   . PONV (postoperative nausea and vomiting)    prolonged sedation  . Recurrent UTI   . Renal calculi 12/23/2017   Multiple bilateral  nonobstructing, 2.2 cm lower pole partial staghorn on left, noted on CT renal  . Sigmoid diverticulosis 12/23/2017   noted on CT renal, pt unaware  . Sleep apnea    uses cpap  . Stroke [facility])    Cryptogenic  . Ulcer of foot ([place])    Left  . Weakness     Patient Active Problem List   Diagnosis Date Noted  . Cerebral embolism with cerebral infarction 12/22/2018  . Septic shock ([place]) 12/15/2018  . Ureteral stone with hydronephrosis   . AKI (acute kidney injury) ([place])   . New onset type 2 diabetes mellitus ([place]) 02/01/2018  . Bipolar 1 disorder ([place]) 02/01/2018  . IUD (intrauterine device) in place 02/01/2018  . Obesity 02/01/2018  . Healthcare maintenance 02/01/2018  . CKD (chronic kidney disease), stage III 12/28/2017  . 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 Acute cystitis with hematuria   . Chronic interstitial nephritis 12/24/2017  . Generalized weakness 12/24/2017  . Sepsis ([place]) 12/24/2017  . Hypokalemia 12/24/2017  . Oral candidiasis 12/24/2017  . Neuropathy involving both lower extremities 01/31/2014  . Perimenopausal 12/29/2012  . Family planning, IUD (intrauterine device) check/reinsertion/removal 12/28/2012  . Pain  12/27/2012    Past Surgical History:  Procedure Laterality Date  . BUBBLE STUDY  12/23/2018   Procedure: BUBBLE STUDY;  Surgeon: Lelon Perla, MD;  Location: [facility] ENDOSCOPY;  Service: Cardiovascular;;  . CERVICAL CONE BIOPSY    . CESAREAN SECTION     x3  . CYSTOSCOPY W/ URETERAL STENT PLACEMENT Right 12/15/2018   Procedure: Cystoscopy, right retrograde ureteropyelogram, fluoroscopic interpretation, right double-J stent placement (24 cm x 6 [place]);  Surgeon: Franchot Gallo, MD;  Location: [facility];  Service: Urology;  Laterality: Right;  . CYSTOSCOPY WITH RETROGRADE PYELOGRAM, URETEROSCOPY AND STENT PLACEMENT Right 01/27/2019   Procedure: CYSTOSCOPY WITH RETROGRADE PYELOGRAM, URETEROSCOPY AND STENT PLACEMENT; BASKET STONE RETRIEVAL;  Surgeon: Alexis Frock, MD;  Location: [facility]S;  Service: Urology;  Laterality: Right;  75 MINS  . CYSTOSCOPY/URETEROSCOPY/HOLMIUM LASER/STENT PLACEMENT Right 02/04/2018   Procedure: CYSTOSCOPY/URETEROSCOPY/RETROGRADE PYELOGRAM/HOLMIUM LASER/STENT PLACEMENT;  Surgeon: Alexis Frock, MD;  Location: [facility];  Service: Urology;  Laterality: Right;  . CYSTOSCOPY/URETEROSCOPY/HOLMIUM LASER/STENT PLACEMENT Right 02/07/2018   Procedure: CYSTOSCOPY LEFT STENT EXCHANGE;  Surgeon: Alexis Frock, MD;  Location: [facility];  Service: Urology;  Laterality: Right;  . DILATION AND CURETTAGE OF UTERUS    . ECTOPIC PREGNANCY SURGERY    . IR NEPHROSTOMY PLACEMENT LEFT  12/25/2017  . LOOP RECORDER INSERTION N/A 12/23/2018   Procedure: LOOP RECORDER INSERTION;  Surgeon: Constance Haw, MD;  Location: [facility];  Service: Cardiovascular;  Laterality: N/A;  . NEPHROLITHOTOMY Left 02/04/2018   Procedure: NEPHROLITHOTOMY PERCUTANEOUS;  Surgeon: Alexis Frock, MD;  Location: [facility];  Service: Urology;  Laterality: Left;  3 HRS  . NEPHROLITHOTOMY Left 02/07/2018   Procedure: SECOND LOOK NEPHROLITHOTOMY PERCUTANEOUS;  Surgeon: Alexis Frock, MD;  Location: [facility];  Service: Urology;  Laterality: Left;  2 HRS  . OVARIAN CYST REMOVAL    . TEE WITHOUT CARDIOVERSION N/A 12/23/2018   Procedure: TRANSESOPHAGEAL ECHOCARDIOGRAM (TEE);  Surgeon: Lelon Perla, MD;  Location[facility] ENDOSCOPY;  Service: Cardiovascular;  Laterality: N/A;  . UNILATERAL SALPINGECTOMY     Pt unsure of which fallopian tube was removed  . WISDOM TOOTH EXTRACTION       OB History    Gravida  7   Para  3   Term      Preterm  3   AB  2   Living  3     SAB  1   TAB      Ectopic  1   Multiple      Live Births  5           Family History  Problem Relation Age of Onset  . Diabetes Paternal Grandfather   . COPD Paternal Grandfather   . Heart disease Paternal Grandfather   . Cancer Paternal Grandfather        bone  . Cancer  Paternal Grandmother   . Heart disease Paternal Grandmother   . Cancer Father   . Hypertension Father   . Heart attack Father   . Alcohol abuse Father   . Depression Father   . Heart failure Mother   . Diabetes Mother   . Heart disease Mother   . Hyperlipidemia Mother   . Hypertension Mother   . Heart attack Mother   . Peripheral vascular disease Mother   . Depression Mother   . COPD Mother   . Hypertension Sister   . Heart attack Sister   . Stroke Maternal Grandmother     Social History  Tobacco Use  . Smoking status: Current Every Day Smoker    Packs/day: 1.00    Years: 37.00    Pack years: 37.00    Types: Cigarettes  . Smokeless tobacco: Never Used  Substance Use Topics  . Alcohol use: No    Alcohol/week: 0.0 standard drinks  . Drug use: No    Home Medications Prior to Admission medications   Medication Sig Start Date End Date Taking? Authorizing Provider  atorvastatin (LIPITOR) 80 MG tablet Take 1 tablet (80 mg total) by mouth daily at 6 PM. 01/16/19   [name], NP  AZO CRANBERRY GUMMIES PO Take 2 tablets by mouth daily.    [provider]  Blood Glucose Monitoring Suppl (ONETOUCH VERIO) w/Device KIT Use to check blood sugars every morning fasting and 2 hours after largest meal 02/02/18   Danford, Valetta Fuller D, NP  clopidogrel (PLAVIX) 75 MG tablet Take 75 mg by mouth daily.    [provider]  gabapentin (NEURONTIN) 300 MG capsule Take 1 capsule (300 mg total) by mouth 4 (four) times daily. 03/10/19 06/08/19  [name], NP  glucose blood (ONETOUCH VERIO) test strip Use to check blood sugars every morning fasting and 2 hours after largest meal 02/23/19   [name], NP  indapamide (LOZOL) 2.5 MG tablet Take 2.5 mg by mouth every evening.    [provider]  Insulin Glargine (LANTUS) 100 UNIT/ML Solostar Pen Inject 15 Units into the skin at bedtime. 01/16/19   [name], NP  Insulin Pen Needle (PEN NEEDLES) 31G X 8 MM  MISC 1 each by Does not apply route daily. 01/16/19   [name], NP  metFORMIN (GLUCOPHAGE) 500 MG tablet Take 1 tablet (500 mg total) by mouth 2 (two) times daily with a meal. 01/16/19 04/16/19  [name], NP  Multiple Vitamins-Minerals (ONE-A-DAY WOMENS 50+ ADVANTAGE) TABS Take 1 tablet by mouth daily.    [provider]  Na Sulfate-K Sulfate-Mg Sulf (SUPREP BOWEL PREP KIT) 17.5-3.13-1.6 GM/177ML SOLN Take 1 kit by mouth as directed. 04/03/19   Thornton Park, MD  ondansetron (ZOFRAN) 4 MG tablet Take 1 tablet (4 mg total) by mouth every 8 (eight) hours as needed for nausea or vomiting. 03/04/19   [name], NP  ONE TOUCH LANCETS MISC Use to check blood sugars every morning fasting and 2 hours after largest meal 02/02/18   Danford, Katy D, NP  senna-docusate (SENOKOT-S) 8.6-50 MG tablet Take 2 tablets by mouth at bedtime as needed for mild constipation. Patient not taking: Reported on 04/03/2019 01/16/19   [name], NP  topiramate (TOPAMAX) 100 MG tablet Take 1 tablet (100 mg total) by mouth 2 (two) times daily. 03/21/19 03/20/20  Garvin Fila, MD    Allergies    Sulfa antibiotics, Latex, and Tape  Review of Systems   Review of Systems  Constitutional: Negative for chills and fever.  HENT: Positive for ear pain. Negative for tinnitus and trouble swallowing.   Respiratory: Negative for cough and shortness of breath.   Musculoskeletal: Negative for back pain and neck pain.  Neurological: Positive for dizziness, numbness (Finger tips and toes, unchanged) and headaches. Negative for speech difficulty and weakness.  Psychiatric/Behavioral: Negative for confusion.  All other systems reviewed and are negative.   Physical Exam Updated Vital Signs BP 101/71   Pulse 81   Temp 98.6 F (37 C) (Oral)   Resp 17   Wt 97 kg  SpO2 95%   BMI 37.88 kg/m   Physical Exam Vitals and nursing note reviewed.  Constitutional:      General: She is not in acute  distress.    Appearance: She is well-developed. She is not diaphoretic.  HENT:     Head: Normocephalic and atraumatic.     Right Ear: Tympanic membrane and ear canal normal.     Left Ear: Tympanic membrane and ear canal normal.     Ears:     Comments: Dry scaly skin on external ears.  Initial canals are significantly narrowed range of vision due to skin flakes. Eyes:     General: Visual field deficit (Not acute) present. No scleral icterus.       Right eye: No discharge.        Left eye: No discharge.     Extraocular Movements: Extraocular movements intact.     Conjunctiva/sclera: Conjunctivae normal.     Pupils: Pupils are equal, round, and reactive to light.  Cardiovascular:     Rate and Rhythm: Normal rate and regular rhythm.  Pulmonary:     Effort: Pulmonary effort is normal. No respiratory distress.     Breath sounds: No stridor.  Abdominal:     General: There is no distension.  Musculoskeletal:        General: No deformity. Normal range of motion.     Cervical back: Normal range of motion and neck supple.  Skin:    General: Skin is warm and dry.  Neurological:     Mental Status: She is alert and oriented to person, place, and time.     GCS: GCS eye subscore is 4. GCS verbal subscore is 5. GCS motor subscore is 6.     Cranial Nerves: No cranial nerve deficit, dysarthria or facial asymmetry.     Motor: No abnormal muscle tone.     Coordination: Romberg sign positive.  Psychiatric:        Mood and Affect: Mood normal. Mood is not anxious.        Behavior: Behavior normal.     ED Results / Procedures / Treatments   Labs (all labs ordered are listed, but only abnormal results are displayed) Labs Reviewed  BASIC METABOLIC PANEL - Abnormal; Notable for the following components:      Result Value   Potassium 3.2 (*)    CO2 21 (*)    Glucose, Bld 189 (*)    BUN 24 (*)    Creatinine, Ser 1.61 (*)    Calcium 10.4 (*)    GFR calc non Af Amer 36 (*)    GFR calc Af Amer 42  (*)    Anion gap 16 (*)    All other components within normal limits  CBC - Abnormal; Notable for the following components:   WBC 12.1 (*)    All other components within normal limits  URINALYSIS, ROUTINE W REFLEX MICROSCOPIC - Abnormal; Notable for the following components:   APPearance CLOUDY (*)    Hgb urine dipstick SMALL (*)    Protein, ur 30 (*)    Nitrite POSITIVE (*)    Leukocytes,Ua LARGE (*)    WBC, UA >50 (*)    Bacteria, UA MANY (*)    All other components within normal limits  URINE CULTURE  CULTURE, BLOOD (ROUTINE X 2)  CULTURE, BLOOD (ROUTINE X 2)  SARS CORONAVIRUS 2 (TAT 6-24 HRS)  MAGNESIUM  LACTIC ACID, PLASMA  LACTIC ACID, PLASMA  I-STAT BETA HCG BLOOD, ED (  [facility], [facility], [facility] ONLY)    EKG EKG Interpretation  Date/Time:  Thursday April 27 2019 16:42:04 EST Ventricular Rate:  90 PR Interval:  152 QRS Duration: 100 QT Interval:  384 QTC Calculation: 469 R Axis:   70 Text Interpretation: Normal sinus rhythm Possible Inferior infarct , age undetermined Cannot rule out Anterior infarct , age undetermined Abnormal ECG possible new ischemia inferior comapred with prior 10/20 Confirmed by Aletta Edouard [phone]) on 04/27/2019 5:48:41 PM   Radiology CT Head Wo Contrast  Result Date: 04/27/2019 CLINICAL DATA:  54 year old female with ataxia.  Concern for stroke. EXAM: CT HEAD WITHOUT CONTRAST TECHNIQUE: Contiguous axial images were obtained from the base of the skull through the vertex without intravenous contrast. COMPARISON:  Head CT dated 12/21/2018. FINDINGS: Brain: There is mild age-related atrophy and chronic microvascular ischemic changes. A subcentimeter hypodense focus in the left corona radiata (series 3, image 17) appears new since the prior CT but likely represents a chronic or less likely a subacute ischemia. Clinical correlation is recommended. There is no acute intracranial hemorrhage. No mass effect or midline shift. No extra-axial fluid collection. Vascular:  No hyperdense vessel or unexpected calcification. Skull: Normal. Negative for fracture or focal lesion. Sinuses/Orbits: No acute finding. Other: None IMPRESSION: 1. No acute intracranial hemorrhage. 2. Age-related atrophy and chronic microvascular ischemic changes. Probable old lacunar infarct of the left corona radiata. Electronically Signed   By: Anner Crete M.D.   On: 04/27/2019 19:48   MR BRAIN WO CONTRAST  Result Date: 04/27/2019 CLINICAL DATA:  Headache and dizziness EXAM: MRI HEAD WITHOUT CONTRAST TECHNIQUE: Multiplanar, multiecho pulse sequences of the brain and surrounding structures were obtained without intravenous contrast. COMPARISON:  12/21/2018 FINDINGS: BRAIN: Encephalomalacia in the left occipital medial temporal lobe at the site of prior infarct. No acute ischemia. Old left corona radiata small vessel infarct. Multifocal white matter hyperintensity, most commonly due to chronic ischemic microangiopathy. Normal volume of brain parenchyma and CSF spaces. midline structures are normal. VASCULAR: Major flow voids are preserved. Susceptibility-sensitive sequences show no chronic microhemorrhage or superficial siderosis. SKULL AND UPPER CERVICAL SPINE: Normal calvarium and skull base. Visualized upper cervical spine and soft tissues are normal. SINUSES/ORBITS: No fluid levels or advanced mucosal thickening. No mastoid or middle ear effusion. Normal orbits. IMPRESSION: 1. No acute intracranial abnormality. 2. Old left PCA territory and left corona radiata small vessel infarcts. 3. Chronic small vessel ischemia. Electronically Signed   By: Ulyses Jarred M.D.   On: 04/27/2019 21:35    Procedures .Ear Cerumen Removal  Date/Time: 04/27/2019 7:33 PM Performed by: Lorin Glass, PA-C Authorized by: Lorin Glass, PA-C   Consent:    Consent obtained:  Verbal   Consent given by:  Patient   Risks discussed:  Bleeding, dizziness, infection, incomplete removal, pain and TM  perforation   Alternatives discussed:  Alternative treatment and no treatment Procedure details:    Location:  L ear and R ear   Procedure type: curette   Post-procedure details:    Inspection:  TM intact   Hearing quality:  Normal   Patient tolerance of procedure:  Tolerated well, no immediate complications   (including critical care time)  Medications Ordered in ED Medications  sodium chloride flush (NS) 0.9 % injection 3 mL (has no administration in time range)  cefTRIAXone (ROCEPHIN) 1 g in sodium chloride 0.9 % 100 mL IVPB (0 g Intravenous Stopped 04/27/19 2118)  potassium chloride SA (KLOR-CON) CR tablet 40 mEq (40 mEq Oral Given  04/27/19 1831)  sodium chloride 0.9 % bolus 2,910 mL (2,910 mLs Intravenous New Bag/Given 04/27/19 2005)  LORazepam (ATIVAN) injection 1 mg (1 mg Intravenous Given 04/27/19 2036)    ED Course  I have reviewed the triage vital signs and the nursing notes.  Pertinent labs & imaging results that were available during my care of the patient were reviewed by me and considered in my medical decision making (see chart for details).  Clinical Course as of Apr 26 2225  Thu Apr 27, 2019  2016 Spoke with Dr. Leonel Ramsay, he recommends an MRI to better age/stage the stroke.   [EH]  741 54 year old female with frequent UTIs complaining complaining of 3 to 4 weeks of feeling in a fog unsteadiness.  Has a nonfocal exam here although unable to ambulate safely.  Labs show her creatinine to be slightly worse and she does have evidence of UTI with nitrite positive greater than 50 whites.  Getting IV fluids IV antibiotics potassium repletion.  Will likely need to be admitted for further management of her infection along with neurologic work-up.   [MB]  2221 Spoke with Hospitalist who will admit patient.    [EH]    Clinical Course User Index [EH] Lorin Glass, PA-C [MB] Hayden Rasmussen, MD   MDM Rules/Calculators/A&P                     Patient presents today  for evaluation of headaches and dizziness.  On arrival she is noted to have soft blood pressures in the low 52C systolic.  When I attempted to have patient stand she was slightly off balance however when I asked her to close her eyes she was unable to do so due to significant feelings of being off balance.  CT head was obtained showing a probable old lacunar infarct.  MRI was obtained to better evaluate this which did not show evidence of a new stroke.  Observed and reviewed, will patient is not having urinary symptoms her urine does appear infected with over 50 white blood cells, 6-10 red cells and many bacteria and 6-10 squamous epithelial cells.  She also has a leukocytosis of 12.1.  Urine and blood cultures were sent.  She is started on IV Rocephin and given 30/kg fluid bolus due to concern for hypotension in the setting of infection.  Lactic acid was not significantly elevated.  BMP shows hypokalemia with a potassium of 3.2, this was orally repleted.  She does have a slight anion gap of 16.  Covid screening testing was sent. This patient was seen as a shared visit with Dr. Melina Copa. I spoke with hospitalist who will see patient for admission as it appears her baseline soft blood pressures have been exacerbated by this UTI.   Note: Portions of this report may have been transcribed using voice recognition software. Every effort was made to ensure accuracy; however, inadvertent computerized transcription errors may be present  Final Clinical Impression(s) / ED Diagnoses Final diagnoses:  Dizziness  Lower urinary tract infectious disease  Hypotension, unspecified hypotension type    Rx / DC Orders ED Discharge Orders    None       Ollen Gross 04/27/19 2232    Hayden Rasmussen, MD 04/28/19 1018

## 2019-04-27 NOTE — H&P (Signed)
History and Physical    Pamela Carney UMP:[id number] DOB: 1965/08/01 DOA: 04/27/2019  PCP: [name], NP  Patient coming from: Home  I have personally briefly reviewed patient's old medical records in [place] and [place]  Chief Complaint: Confusion  HPI: Pamela Carney is a 54 y.o. female with medical history significant of bipolar 1, obesity, migraines, cryptogenic stroke, CKD 3 CVA, recurrent UTIs and recurrent kidney stones status post cystoscopy with retrograde pyelogram most recently November 2020 who presents from today with a chief complaint of headache, dizziness, tingling and "brain fog" that has been progressive for the last 3 to 4 weeks.  Patient has a history of CVA and reports she has felt weird and finds herself calling her children by her pets names and similar style mistakes.  She states she has also had occasional dysuria, no hematuria, reports overall her symptoms are consistent with when she has a UTI.  She denies fevers, chills, nausea, vomiting, abdominal pain, diarrhea, hematemesis, melena, hematochezia.  ED Course: On arrival to the ED pt was afebrile, normal rate, respiratory rate, blood pressure borderline low ranging between 80 and 400 systolic, saturating 86% on room air.. Labs and imaging studies acquired. Initial labwork notable for a leukocytosis with a white count of 12.1, hemoglobin 14.2, potassium 3.2, creatinine 1.6 which is above her baseline of around 1.3.  Bicarb 21, anion gap 16, BUN 24.  UDS negative.  Urinalysis with many bacteria, WBC, nitrate, leukocyte esterase. Blood and urine cultures obtained. Imaging studies notable for age-related atrophy and chronic microvascular ischemic changes with a probable old lacunar infarct in the left corona radiata on CT head.  MRI brain obtained notable for an old left PCA territory left corona radiata small vessel infarcts with chronic small vessel ischemia. Patient was administered Rocephin and IVF. Given concern  for severe UTI with borderline hypotension, decision was made to admit.   Review of Systems: As per HPI otherwise 10 point review of systems negative.   Past Medical History:  Diagnosis Date  . Abnormal Pap smear   . AKI (acute kidney injury) ([place]) 12/28/2017  . Anxiety   . Arthritis    bil knees, neck  . Bipolar 1 disorder ([place])   . Cataract    Mild  . Cholelithiasis 12/23/2017   noted on CT renal, pt unaware  . Chronic kidney disease (CKD), stage III (moderate)   . Cystitis   . Depression   . Diabetes mellitus without complication ([place])    type 2  . Genital warts    Hx of genital  . GERD (gastroesophageal reflux disease)   . Heart murmur    childhood  . Hematuria   . History of blood transfusion   . History of ectopic pregnancy   . History of gestational diabetes   . History of kidney stones   . History of sepsis    after ectopic pregnancy  . Idiopathic peripheral neuropathy    both feet  . Leg ulcer, left ([place])   . Low back pain   . Migraines   . Obesity   . Oral candidiasis   . Pneumonia   . PONV (postoperative nausea and vomiting)    prolonged sedation  . Recurrent UTI   . 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 Sigmoid diverticulosis 12/23/2017   noted on CT renal, pt unaware  . Sleep apnea    uses cpap  .  Stroke [facility])    Cryptogenic  . Ulcer of foot ([place])    Left  . Weakness     Past Surgical History:  Procedure Laterality Date  . BUBBLE STUDY  12/23/2018   Procedure: BUBBLE STUDY;  Surgeon: Lelon Perla, MD;  Location: [facility] ENDOSCOPY;  Service: Cardiovascular;;  . CERVICAL CONE BIOPSY    . CESAREAN SECTION     x3  . CYSTOSCOPY W/ URETERAL STENT PLACEMENT Right 12/15/2018   Procedure: Cystoscopy, right retrograde ureteropyelogram, fluoroscopic interpretation, right double-J stent placement (24 cm x 6 [place]);  Surgeon: Franchot Gallo, MD;  Location: [place];  Service: Urology;  Laterality: Right;  . CYSTOSCOPY WITH RETROGRADE PYELOGRAM, URETEROSCOPY AND STENT PLACEMENT Right 01/27/2019   Procedure: CYSTOSCOPY WITH RETROGRADE PYELOGRAM, URETEROSCOPY AND STENT PLACEMENT; BASKET STONE RETRIEVAL;  Surgeon: Alexis Frock, MD;  Location: [facility] ORS;  Service: Urology;  Laterality: Right;  75 MINS  . CYSTOSCOPY/URETEROSCOPY/HOLMIUM LASER/STENT PLACEMENT Right 02/04/2018   Procedure: CYSTOSCOPY/URETEROSCOPY/RETROGRADE PYELOGRAM/HOLMIUM LASER/STENT PLACEMENT;  Surgeon: Alexis Frock, MD;  Location: [facility] ORS;  Service: Urology;  Laterality: Right;  . CYSTOSCOPY/URETEROSCOPY/HOLMIUM LASER/STENT PLACEMENT Right 02/07/2018   Procedure: CYSTOSCOPY LEFT STENT EXCHANGE;  Surgeon: Alexis Frock, MD;  Location: [facility] ORS;  Service: Urology;  Laterality: Right;  . DILATION AND CURETTAGE OF UTERUS    . ECTOPIC PREGNANCY SURGERY    . IR NEPHROSTOMY PLACEMENT LEFT  12/25/2017  . LOOP RECORDER INSERTION N/A 12/23/2018   Procedure: LOOP RECORDER INSERTION;  Surgeon: Constance Haw, MD;  Location: [place] [facility];  Service: Cardiovascular;  Laterality: N/A;  . NEPHROLITHOTOMY Left 02/04/2018   Procedure: NEPHROLITHOTOMY PERCUTANEOUS;  Surgeon: Alexis Frock, MD;  Location: [facility];  Service: Urology;  Laterality: Left;  3 HRS  . NEPHROLITHOTOMY Left 02/07/2018   Procedure: SECOND LOOK NEPHROLITHOTOMY PERCUTANEOUS;  Surgeon: Alexis Frock, MD;  Location: [facility];  Service: Urology;  Laterality: Left;  2 HRS  . OVARIAN CYST REMOVAL    . TEE WITHOUT CARDIOVERSION N/A 12/23/2018   Procedure: TRANSESOPHAGEAL ECHOCARDIOGRAM (TEE);  Surgeon: Lelon Perla, MD;  Location[facility] ENDOSCOPY;  Service: Cardiovascular;  Laterality: N/A;  . UNILATERAL SALPINGECTOMY     Pt unsure of which fallopian tube was removed  . WISDOM TOOTH EXTRACTION       reports that she has been smoking cigarettes. She has a 37.00 pack-year smoking history. She has never used smokeless tobacco. She  reports that she does not drink alcohol or use drugs.  Allergies  Allergen Reactions  . Sulfa Antibiotics Hives and Swelling    Swelling site not recalled  . Latex Rash  . Tape Rash    Not tolerated well    Family History  Problem Relation Age of Onset  . Diabetes Paternal Grandfather   . COPD Paternal Grandfather   . Heart disease Paternal Grandfather   . Cancer Paternal Grandfather        bone  . Cancer Paternal Grandmother   . Heart disease Paternal Grandmother   . Cancer Father   . Hypertension Father   . Heart attack Father   . Alcohol abuse Father   . Depression Father   . Heart failure Mother   . Diabetes Mother   . Heart disease Mother   . Hyperlipidemia Mother   . Hypertension Mother   . Heart attack Mother   . Peripheral vascular disease Mother   . Depression Mother   . COPD Mother   . Hypertension Sister   . Heart attack Sister   .  Stroke Maternal Grandmother     Prior to Admission medications   Medication Sig Start Date End Date Taking? Authorizing Provider  atorvastatin (LIPITOR) 80 MG tablet Take 1 tablet (80 mg total) by mouth daily at 6 PM. 01/16/19   [name], NP  AZO CRANBERRY GUMMIES PO Take 2 tablets by mouth daily.    [provider]  Blood Glucose Monitoring Suppl (ONETOUCH VERIO) w/Device KIT Use to check blood sugars every morning fasting and 2 hours after largest meal 02/02/18   Danford, Valetta Fuller D, NP  clopidogrel (PLAVIX) 75 MG tablet Take 75 mg by mouth daily.    [provider]  gabapentin (NEURONTIN) 300 MG capsule Take 1 capsule (300 mg total) by mouth 4 (four) times daily. 03/10/19 06/08/19  [name], NP  glucose blood (ONETOUCH VERIO) test strip Use to check blood sugars every morning fasting and 2 hours after largest meal 02/23/19   [name], NP  indapamide (LOZOL) 2.5 MG tablet Take 2.5 mg by mouth every evening.    [provider]  Insulin Glargine (LANTUS) 100 UNIT/ML Solostar Pen Inject  15 Units into the skin at bedtime. 01/16/19   [name], NP  Insulin Pen Needle (PEN NEEDLES) 31G X 8 MM MISC 1 each by Does not apply route daily. 01/16/19   [name], NP  metFORMIN (GLUCOPHAGE) 500 MG tablet Take 1 tablet (500 mg total) by mouth 2 (two) times daily with a meal. 01/16/19 04/16/19  [name], NP  Multiple Vitamins-Minerals (ONE-A-DAY WOMENS 50+ ADVANTAGE) TABS Take 1 tablet by mouth daily.    [provider]  Na Sulfate-K Sulfate-Mg Sulf (SUPREP BOWEL PREP KIT) 17.5-3.13-1.6 GM/177ML SOLN Take 1 kit by mouth as directed. 04/03/19   Thornton Park, MD  ondansetron (ZOFRAN) 4 MG tablet Take 1 tablet (4 mg total) by mouth every 8 (eight) hours as needed for nausea or vomiting. 03/04/19   [name], NP  ONE TOUCH LANCETS MISC Use to check blood sugars every morning fasting and 2 hours after largest meal 02/02/18   Danford, Katy D, NP  senna-docusate (SENOKOT-S) 8.6-50 MG tablet Take 2 tablets by mouth at bedtime as needed for mild constipation. Patient not taking: Reported on 04/03/2019 01/16/19   [name], NP  topiramate (TOPAMAX) 100 MG tablet Take 1 tablet (100 mg total) by mouth 2 (two) times daily. 03/21/19 03/20/20  Garvin Fila, MD    Physical Exam: Vitals:   04/27/19 1627 04/27/19 1817 04/27/19 1830 04/27/19 1908  BP: 98/66 (!) 85/60 101/71   Pulse: 100 84 81   Resp: 16 (!) 24 17   Temp: 98.6 F (37 C)     TempSrc: Oral     SpO2: 100% 98% 95%   Weight:    97 kg   Constitutional: NAD, calm, comfortable Eyes: PERRL, lids and conjunctivae normal ENMT: Mucous membranes are moist. Posterior pharynx clear of any exudate or lesions. Neck: normal, supple, no masses, no thyromegaly Respiratory: clear to auscultation bilaterally, no wheezing, no crackles. Normal respiratory effort. No accessory muscle use.  Cardiovascular: Regular rate and rhythm, no murmurs / rubs / gallops. No extremity edema. 2+ pedal pulses. No carotid  bruits.  Abdomen: Mild suprapubic tenderness, no masses palpated. No hepatosplenomegaly. Bowel sounds positive.  Musculoskeletal: no clubbing / cyanosis. No joint deformity upper and lower extremities. Good ROM, no contractures. Normal muscle tone.  Skin: no rashes, lesions, ulcers. No induration Neurologic: CN 2-12 grossly intact.  Sensation intact. Strength 5/5 in all 4.  Occasional word finding difficulty noted on interview. Psychiatric: Normal judgment and insight. Alert and oriented x 3. Normal mood.    Labs on Admission: I have personally reviewed following labs and imaging studies  CBC: Recent Labs  Lab 04/27/19 1647  WBC 12.1*  HGB 14.2  HCT 44.1  MCV 100.0  PLT 233   Basic Metabolic Panel: Recent Labs  Lab 04/27/19 1647 04/27/19 1810  NA 138  --   K 3.2*  --   CL 101  --   CO2 21*  --   GLUCOSE 189*  --   BUN 24*  --   CREATININE 1.61*  --   CALCIUM 10.4*  --   MG  --  2.0   GFR: Estimated Creatinine Clearance: 45.3 mL/min (A) (by C-G formula based on SCr of 1.61 mg/dL (H)). Liver Function Tests: No results for input(s): AST, ALT, ALKPHOS, BILITOT, PROT, ALBUMIN in the last 168 hours. No results for input(s): LIPASE, AMYLASE in the last 168 hours. No results for input(s): AMMONIA in the last 168 hours. Coagulation Profile: No results for input(s): INR, PROTIME in the last 168 hours. Cardiac Enzymes: No results for input(s): CKTOTAL, CKMB, CKMBINDEX, TROPONINI in the last 168 hours. BNP (last 3 results) No results for input(s): PROBNP in the last 8760 hours. HbA1C: No results for input(s): HGBA1C in the last 72 hours. CBG: No results for input(s): GLUCAP in the last 168 hours. Lipid Profile: No results for input(s): CHOL, HDL, LDLCALC, TRIG, CHOLHDL, LDLDIRECT in the last 72 hours. Thyroid Function Tests: No results for input(s): TSH, T4TOTAL, FREET4, T3FREE, THYROIDAB in the last 72 hours. Anemia Panel: No results for input(s): VITAMINB12, FOLATE,  FERRITIN, TIBC, IRON, RETICCTPCT in the last 72 hours. Urine analysis:    Component Value Date/Time   COLORURINE YELLOW 04/27/2019 1804   APPEARANCEUR CLOUDY (A) 04/27/2019 1804   LABSPEC 1.015 04/27/2019 1804   PHURINE 5.0 04/27/2019 1804   GLUCOSEU NEGATIVE 04/27/2019 1804   HGBUR SMALL (A) 04/27/2019 1804   BILIRUBINUR NEGATIVE 04/27/2019 1804   KETONESUR NEGATIVE 04/27/2019 1804   PROTEINUR 30 (A) 04/27/2019 1804   UROBILINOGEN 0.2 03/31/2010 0246   NITRITE POSITIVE (A) 04/27/2019 1804   LEUKOCYTESUR LARGE (A) 04/27/2019 1804    Radiological Exams on Admission: CT Head Wo Contrast  Result Date: 04/27/2019 CLINICAL DATA:  54 year old female with ataxia.  Concern for stroke. EXAM: CT HEAD WITHOUT CONTRAST TECHNIQUE: Contiguous axial images were obtained from the base of the skull through the vertex without intravenous contrast. COMPARISON:  Head CT dated 12/21/2018. FINDINGS: Brain: There is mild age-related atrophy and chronic microvascular ischemic changes. A subcentimeter hypodense focus in the left corona radiata (series 3, image 17) appears new since the prior CT but likely represents a chronic or less likely a subacute ischemia. Clinical correlation is recommended. There is no acute intracranial hemorrhage. No mass effect or midline shift. No extra-axial fluid collection. Vascular: No hyperdense vessel or unexpected calcification. Skull: Normal. Negative for fracture or focal lesion. Sinuses/Orbits: No acute finding. Other: None IMPRESSION: 1. No acute intracranial hemorrhage. 2. Age-related atrophy and chronic microvascular ischemic changes. Probable old lacunar infarct of the left corona radiata. Electronically Signed   By: Anner Crete M.D.   On: 04/27/2019 19:48   MR BRAIN WO CONTRAST  Result Date: 04/27/2019 CLINICAL DATA:  Headache and dizziness EXAM: MRI HEAD WITHOUT CONTRAST TECHNIQUE: Multiplanar, multiecho pulse sequences of the brain and surrounding structures were  obtained without intravenous contrast. COMPARISON:  12/21/2018 FINDINGS: BRAIN: Encephalomalacia in the left occipital medial temporal lobe at the site of prior infarct. No acute ischemia. Old left corona radiata small vessel infarct. Multifocal white matter hyperintensity, most commonly due to chronic ischemic microangiopathy. Normal volume of brain parenchyma and CSF spaces. midline structures are normal. VASCULAR: Major flow voids are preserved. Susceptibility-sensitive sequences show no chronic microhemorrhage or superficial siderosis. SKULL AND UPPER CERVICAL SPINE: Normal calvarium and skull base. Visualized upper cervical spine and soft tissues are normal. SINUSES/ORBITS: No fluid levels or advanced mucosal thickening. No mastoid or middle ear effusion. Normal orbits. IMPRESSION: 1. No acute intracranial abnormality. 2. Old left PCA territory and left corona radiata small vessel infarcts. 3. Chronic small vessel ischemia. Electronically Signed   By: Ulyses Jarred M.D.   On: 04/27/2019 21:35    EKG: Independently reviewed.  Sinus rhythm, rate 90, PR interval 152, QRS duration 100, Q waves noted in lead III and aVF.  Assessment/Plan Active Problems:   UTI (urinary tract infection)  UTI Toxic/metabolic encephalopathy in the setting of infection Patient presents with altered mental status with word finding difficulty in the setting of frequent UTIs with associated nephrolithiasis in a background of prior strokes, which predispose her to toxic encephalopathy.  Patient reports mild dysuria and symptoms consistent with prior UTIs.  UA concerning for bacteria, leukocyte esterase, WBC, nitrites.  Overall consistent with UTI.  Blood pressure is borderline low in ED concern for severe infection, though possibly related to home indapamide. Plan: -s/p rocephin in ed; 2.9L NS  -Continue Rocephin, follow blood and urine cultures and taper/transition to oral therapy pending clinical improvement and culture  results. -Close attention to blood pressures -For encephalopathy will avoid potential sedating medications, holding home gabapentin (particularly in the setting of her mild AKI), prefer her gentle behavioral interventions/reorientation if necessary.  MRIs performed in the emergency department without any evidence of new ischemia.  AKI Non-anion gap metabolic acidosis -Presents with creatinine 1.6 above baseline of around 1.3.  Likely hypovolemic in setting of infection.  Differential includes ATN given her relative hypotension.  Metabolic acidosis likely in setting of AKI with inappropriate bicarb wasting. -Status post 3 L normal saline in ED, trend creatinine and bicarb. -Currently holding her home indapamide, which she reportedly takes for her nephrolithiasis and to prevent renal stone formation.  Unfortunately given her consistent hypotension and metabolic abnormalities, this may be contraindicated for future use for her.  Insulin-dependent type 2 diabetes -Continuing Lantus 15 units at bedtime, corrective lispro; holding home Metformin now.  History of CVA -Predisposes her to encephalopathy as above, continuing home Plavix, Lipitor  DVT prophylaxis: Lovenox Code Status: Full Family Communication: None at bedside Disposition Plan: Likely discharge home in 1 to 2 days pending clinical improvement Consults called: None Admission status: Observation   S. Carolyne Fiscal, DO [facility] Pager [phone]  If 7PM-7AM, please contact night-coverage [email or web address] Password [email or web address]  04/27/2019, 10:16 PM

## 2019-04-27 NOTE — ED Triage Notes (Signed)
Pt arrives to ED from home with complaints of a headache, dizziness, tingling, and brain fog for the past 3-4 weeks. Patient states she just feels "weird and finds herself calling her kids her dogs name."  Patient states that there is no acute change today just felt like she needed to get checked out. Patient has hx of stroke.

## 2019-04-28 ENCOUNTER — Encounter (HOSPITAL_COMMUNITY): Payer: Self-pay | Admitting: Internal Medicine

## 2019-04-28 DIAGNOSIS — N3000 Acute cystitis without hematuria: Secondary | ICD-10-CM | POA: Diagnosis not present

## 2019-04-28 DIAGNOSIS — G9341 Metabolic encephalopathy: Secondary | ICD-10-CM | POA: Diagnosis present

## 2019-04-28 DIAGNOSIS — N179 Acute kidney failure, unspecified: Secondary | ICD-10-CM | POA: Diagnosis not present

## 2019-04-28 LAB — COMPREHENSIVE METABOLIC PANEL
ALT: 29 U/L (ref 0–44)
AST: 20 U/L (ref 15–41)
Albumin: 2.8 g/dL — ABNORMAL LOW (ref 3.5–5.0)
Alkaline Phosphatase: 111 U/L (ref 38–126)
Anion gap: 9 (ref 5–15)
BUN: 21 mg/dL — ABNORMAL HIGH (ref 6–20)
CO2: 23 mmol/L (ref 22–32)
Calcium: 8.8 mg/dL — ABNORMAL LOW (ref 8.9–10.3)
Chloride: 110 mmol/L (ref 98–111)
Creatinine, Ser: 1.43 mg/dL — ABNORMAL HIGH (ref 0.44–1.00)
GFR calc Af Amer: 49 mL/min — ABNORMAL LOW (ref >60)
GFR calc non Af Amer: 42 mL/min — ABNORMAL LOW (ref >60)
Glucose, Bld: 197 mg/dL — ABNORMAL HIGH (ref 70–99)
Potassium: 3.4 mmol/L — ABNORMAL LOW (ref 3.5–5.1)
Sodium: 142 mmol/L (ref 135–145)
Total Bilirubin: 0.4 mg/dL (ref 0.3–1.2)
Total Protein: 5.9 g/dL — ABNORMAL LOW (ref 6.5–8.1)

## 2019-04-28 LAB — CBC
HCT: 37.1 % (ref 36.0–46.0)
Hemoglobin: 11.8 g/dL — ABNORMAL LOW (ref 12.0–15.0)
MCH: 31.9 pg (ref 26.0–34.0)
MCHC: 31.8 g/dL (ref 30.0–36.0)
MCV: 100.3 fL — ABNORMAL HIGH (ref 80.0–100.0)
Platelets: 296 10*3/uL (ref 150–400)
RBC: 3.7 MIL/uL — ABNORMAL LOW (ref 3.87–5.11)
RDW: 12.5 % (ref 11.5–15.5)
WBC: 10.1 10*3/uL (ref 4.0–10.5)
nRBC: 0 % (ref 0.0–0.2)

## 2019-04-28 LAB — LACTIC ACID, PLASMA: Lactic Acid, Venous: 1.1 mmol/L (ref 0.5–1.9)

## 2019-04-28 LAB — SARS CORONAVIRUS 2 (TAT 6-24 HRS): SARS Coronavirus 2: NEGATIVE

## 2019-04-28 LAB — HIV ANTIBODY (ROUTINE TESTING W REFLEX): HIV Screen 4th Generation wRfx: NONREACTIVE

## 2019-04-28 LAB — AMMONIA: Ammonia: 31 umol/L (ref 9–35)

## 2019-04-28 LAB — MAGNESIUM: Magnesium: 1.7 mg/dL (ref 1.7–2.4)

## 2019-04-28 LAB — TSH: TSH: 1.026 u[IU]/mL (ref 0.350–4.500)

## 2019-04-28 LAB — HEMOGLOBIN A1C
Hgb A1c MFr Bld: 7.4 % — ABNORMAL HIGH (ref 4.8–5.6)
Mean Plasma Glucose: 165.68 mg/dL

## 2019-04-28 LAB — PHOSPHORUS: Phosphorus: 3.4 mg/dL (ref 2.5–4.6)

## 2019-04-28 MED ORDER — SODIUM CHLORIDE 0.9 % IV BOLUS
500.0000 mL | Freq: Once | INTRAVENOUS | Status: AC
  Administered 2019-04-28: 500 mL via INTRAVENOUS

## 2019-04-28 MED ORDER — ENOXAPARIN SODIUM 60 MG/0.6ML ~~LOC~~ SOLN: 0.5000 mg/kg | SUBCUTANEOUS | Status: DC

## 2019-04-28 MED ORDER — SODIUM CHLORIDE 0.9 % IV SOLN: INTRAVENOUS | Status: DC

## 2019-04-28 MED ORDER — CEFDINIR 300 MG PO CAPS: 300.0000 mg | ORAL_CAPSULE | Freq: Two times a day (BID) | ORAL | 0 refills | Status: AC

## 2019-04-28 NOTE — Discharge Summary (Signed)
Physician Discharge Summary  Pamela Carney FMB:[id number] DOB: 1965/08/04 DOA: 04/27/2019  PCP: [name], NP  Admit date: 04/27/2019 Discharge date: 04/28/2019  Admitted From: Home Disposition: Home  Recommendations for Outpatient Follow-up:  1. Follow up with PCP in 1-2 weeks 2. Please obtain BMP/CBC in one week 3. Please follow up on the following pending results:  Home Health: None Equipment/Devices: None  Discharge Condition: Stable CODE STATUS: Full code Diet recommendation: Regular  Subjective: Seen and examined.  Feels much better.  She is completely alert and oriented.  HPI: Pamela Carney is a 54 y.o. female with medical history significant of bipolar 1, obesity, migraines, cryptogenic stroke, CKD 3 CVA, recurrent UTIs and recurrent kidney stones status post cystoscopy with retrograde pyelogram most recently November 2020 who presents from today with a chief complaint of headache, dizziness, tingling and "brain fog" that has been progressive for the last 3 to 4 weeks.  Patient has a history of CVA and reports she has felt weird and finds herself calling her children by her pets names and similar style mistakes.  She states she has also had occasional dysuria, no hematuria, reports overall her symptoms are consistent with when she has a UTI.  She denies fevers, chills, nausea, vomiting, abdominal pain, diarrhea, hematemesis, melena, hematochezia.  ED Course: On arrival to the ED pt was afebrile, normal rate, respiratory rate, blood pressure borderline low ranging between 80 and 701 systolic, saturating 77% on room air.. Labs and imaging studies acquired. Initial labwork notable for a leukocytosis with a white count of 12.1, hemoglobin 14.2, potassium 3.2, creatinine 1.6 which is above her baseline of around 1.3.  Bicarb 21, anion gap 16, BUN 24.  UDS negative.  Urinalysis with many bacteria, WBC, nitrate, leukocyte esterase. Blood and urine cultures obtained. Imaging  studies notable for age-related atrophy and chronic microvascular ischemic changes with a probable old lacunar infarct in the left corona radiata on CT head.  MRI brain obtained notable for an old left PCA territory left corona radiata small vessel infarcts with chronic small vessel ischemia. Patient was administered Rocephin and IVF. Given concern for severe UTI with borderline hypotension, decision was made to admit.   Brief/Interim Summary: Patient was admitted due to acute metabolic encephalopathy and was also diagnosed with UTI.  Her encephalopathy was thought to be secondary to UTI.  She was started on IV ceftriaxone.  Underwent CT head as well as MRI and both of them were negative for any acute stroke however this showed chronic stroke.  Patient remained hemodynamically stable.  When seen this morning, she has no complaints and she is back to her baseline which is completely alert and oriented.  She also came in with slightly elevated creatinine making the diagnosis of acute on chronic kidney disease stage III.  She was hydrated.  Her renal function is back to baseline.  Urine culture remains negative.  Her blood pressure has been low and after talking to the patient, I learned that her blood pressure always runs low usually in 93J systolic and 03E diastolic however here she has been in 09Q systolic.  This is improved compared to her baseline.  Now that she is back to her baseline so she is going to be discharged home.  After reviewing previous urine culture results, often she has grown E. coli which has been sensitive to ceftriaxone and based on that, I am empirically discharging her on cefdinir for 5 days.  She will follow with PCP within  a week.  Discharge Diagnoses:  Active Problems:   Acute renal failure superimposed on stage 3 chronic kidney disease (HCC)   UTI (urinary tract infection)   Acute metabolic encephalopathy    Discharge Instructions  Discharge Instructions    Discharge  patient   Complete by: As directed    Discharge disposition: 01-Home or Self Care   Discharge patient date: 04/28/2019     Allergies as of 04/28/2019      Reactions   Sulfa Antibiotics Hives, Swelling   Swelling site not recalled   Latex Rash   Tape Rash   Not tolerated well      Medication List    STOP taking these medications   Suprep Bowel Prep Kit 17.5-3.13-1.6 GM/177ML Soln Generic drug: Na Sulfate-K Sulfate-Mg Sulf     TAKE these medications   atorvastatin 80 MG tablet Commonly known as: LIPITOR Take 1 tablet (80 mg total) by mouth daily at 6 PM.   AZO CRANBERRY GUMMIES PO Take 2 tablets by mouth daily.   cefdinir 300 MG capsule Commonly known as: OMNICEF Take 1 capsule (300 mg total) by mouth 2 (two) times daily for 5 days.   clopidogrel 75 MG tablet Commonly known as: PLAVIX Take 75 mg by mouth daily.   gabapentin 300 MG capsule Commonly known as: NEURONTIN Take 1 capsule (300 mg total) by mouth 4 (four) times daily.   indapamide 2.5 MG tablet Commonly known as: LOZOL Take 2.5 mg by mouth every evening.   Insulin Glargine 100 UNIT/ML Solostar Pen Commonly known as: LANTUS Inject 15 Units into the skin at bedtime.   metFORMIN 500 MG tablet Commonly known as: GLUCOPHAGE Take 1 tablet (500 mg total) by mouth 2 (two) times daily with a meal. What changed: how much to take   ondansetron 4 MG tablet Commonly known as: Zofran Take 1 tablet (4 mg total) by mouth every 8 (eight) hours as needed for nausea or vomiting.   ONE TOUCH LANCETS Misc Use to check blood sugars every morning fasting and 2 hours after largest meal   One-A-Day Womens 50+ Advantage Tabs Take 1 tablet by mouth daily.   OneTouch Verio test strip Generic drug: glucose blood Use to check blood sugars every morning fasting and 2 hours after largest meal   OneTouch Verio w/Device Kit Use to check blood sugars every morning fasting and 2 hours after largest meal   Pen Needles 31G X 8 MM  Misc 1 each by Does not apply route daily.   topiramate 100 MG tablet Commonly known as: Topamax Take 1 tablet (100 mg total) by mouth 2 (two) times daily.      Follow-up Information    [name], NP Follow up in 1 week(s).   Specialty: Nurse Practitioner Contact information: [address] [place] [address] [phone]          Allergies  Allergen Reactions  . Sulfa Antibiotics Hives and Swelling    Swelling site not recalled  . Latex Rash  . Tape Rash    Not tolerated well    Consultations: None  Procedures/Studies: CT Head Wo Contrast  Result Date: 04/27/2019 CLINICAL DATA:  54 year old female with ataxia.  Concern for stroke. EXAM: CT HEAD WITHOUT CONTRAST TECHNIQUE: Contiguous axial images were obtained from the base of the skull through the vertex without intravenous contrast. COMPARISON:  Head CT dated 12/21/2018. FINDINGS: Brain: There is mild age-related atrophy and chronic microvascular ischemic changes. A subcentimeter hypodense focus in the  left corona radiata (series 3, image 17) appears new since the prior CT but likely represents a chronic or less likely a subacute ischemia. Clinical correlation is recommended. There is no acute intracranial hemorrhage. No mass effect or midline shift. No extra-axial fluid collection. Vascular: No hyperdense vessel or unexpected calcification. Skull: Normal. Negative for fracture or focal lesion. Sinuses/Orbits: No acute finding. Other: None IMPRESSION: 1. No acute intracranial hemorrhage. 2. Age-related atrophy and chronic microvascular ischemic changes. Probable old lacunar infarct of the left corona radiata. Electronically Signed   By: Anner Crete M.D.   On: 04/27/2019 19:48   MR BRAIN WO CONTRAST  Result Date: 04/27/2019 CLINICAL DATA:  Headache and dizziness EXAM: MRI HEAD WITHOUT CONTRAST TECHNIQUE: Multiplanar, multiecho pulse sequences of the brain and surrounding structures were obtained without  intravenous contrast. COMPARISON:  12/21/2018 FINDINGS: BRAIN: Encephalomalacia in the left occipital medial temporal lobe at the site of prior infarct. No acute ischemia. Old left corona radiata small vessel infarct. Multifocal white matter hyperintensity, most commonly due to chronic ischemic microangiopathy. Normal volume of brain parenchyma and CSF spaces. midline structures are normal. VASCULAR: Major flow voids are preserved. Susceptibility-sensitive sequences show no chronic microhemorrhage or superficial siderosis. SKULL AND UPPER CERVICAL SPINE: Normal calvarium and skull base. Visualized upper cervical spine and soft tissues are normal. SINUSES/ORBITS: No fluid levels or advanced mucosal thickening. No mastoid or middle ear effusion. Normal orbits. IMPRESSION: 1. No acute intracranial abnormality. 2. Old left PCA territory and left corona radiata small vessel infarcts. 3. Chronic small vessel ischemia. Electronically Signed   By: Ulyses Jarred M.D.   On: 04/27/2019 21:35   CUP PACEART REMOTE DEVICE CHECK  Result Date: 04/06/2019 [facility] summary report received. Battery status OK. Normal device function. No new symptom episodes, tachy episodes, brady, or pause episodes. No new AF episodes. Monthly summary reports and ROV/PRN Kathy Breach, RN, CCDS, CV Remote Soloutions    Discharge Exam: Vitals:   04/28/19 0110 04/28/19 0802  BP: (!) 83/62 (!) 87/62  Pulse: 79 69  Resp: 20   Temp: 98.6 F (37 C)   SpO2: 92%    Vitals:   04/28/19 0015 04/28/19 0034 04/28/19 0110 04/28/19 0802  BP:  92/61 (!) 83/62 (!) 87/62  Pulse: 81 84 79 69  Resp:  20 20   Temp:   98.6 F (37 C)   TempSrc:   Oral   SpO2: 97% 98% 92%   Weight:   99.8 kg   Height:   5' 2"  (1.575 m)     General: Pt is alert, awake, not in acute distress Cardiovascular: RRR, S1/S2 +, no rubs, no gallops Respiratory: CTA bilaterally, no wheezing, no rhonchi Abdominal: Soft, NT, ND, bowel sounds + Extremities: no edema, no  cyanosis    The results of significant diagnostics from this hospitalization (including imaging, microbiology, ancillary and laboratory) are listed below for reference.     Microbiology: Recent Results (from the past 240 hour(s))  SARS CORONAVIRUS 2 (TAT 6-24 HRS) Nasopharyngeal Nasopharyngeal Swab     Status: None   Collection Time: 04/27/19  7:14 PM   Specimen: Nasopharyngeal Swab  Result Value Ref Range Status   SARS Coronavirus 2 NEGATIVE NEGATIVE Final    Comment: (NOTE) SARS-CoV-2 target nucleic acids are NOT DETECTED. The SARS-CoV-2 RNA is generally detectable in upper and lower respiratory specimens during the acute phase of infection. Negative results do not preclude SARS-CoV-2 infection, do not rule out co-infections with other pathogens, and should not be  used as the sole basis for treatment or other patient management decisions. Negative results must be combined with clinical observations, patient history, and epidemiological information. The expected result is Negative. Fact Sheet for Patients: SugarRoll.be Fact Sheet for Healthcare Providers: https://www.woods-mathews.com/ This test is not yet approved or cleared by the [place] FDA and  has been authorized for detection and/or diagnosis of SARS-CoV-2 by FDA under an Emergency Use Authorization (EUA). This EUA will remain  in effect (meaning this test can be used) for the duration of the COVID-19 declaration under Section 56 4(b)(1) of the Act, 21 U.S.C. section 360bbb-3(b)(1), unless the authorization is terminated or revoked sooner. Performed at [facility], [place] [address], [place], [place] [address]      Labs: BNP (last 3 results) No results for input(s): BNP in the last 8760 hours. Basic Metabolic Panel: Recent Labs  Lab 04/27/19 1647 04/27/19 1810 04/28/19 0137  NA 138  --  142  K 3.2*  --  3.4*  CL 101  --  110  CO2 21*  --  23  GLUCOSE 189*   --  197*  BUN 24*  --  21*  CREATININE 1.61*  --  1.43*  CALCIUM 10.4*  --  8.8*  MG  --  2.0 1.7  PHOS  --   --  3.4   Liver Function Tests: Recent Labs  Lab 04/28/19 0137  AST 20  ALT 29  ALKPHOS 111  BILITOT 0.4  PROT 5.9*  ALBUMIN 2.8*   No results for input(s): LIPASE, AMYLASE in the last 168 hours. Recent Labs  Lab 04/28/19 0137  AMMONIA 31   CBC: Recent Labs  Lab 04/27/19 1647 04/28/19 0137  WBC 12.1* 10.1  HGB 14.2 11.8*  HCT 44.1 37.1  MCV 100.0 100.3*  PLT 342 296   Cardiac Enzymes: No results for input(s): CKTOTAL, CKMB, CKMBINDEX, TROPONINI in the last 168 hours. BNP: Invalid input(s): POCBNP CBG: Recent Labs  Lab 04/27/19 2245  GLUCAP 110*   D-Dimer No results for input(s): DDIMER in the last 72 hours. Hgb A1c Recent Labs    04/28/19 0137  HGBA1C 7.4*   Lipid Profile No results for input(s): CHOL, HDL, LDLCALC, TRIG, CHOLHDL, LDLDIRECT in the last 72 hours. Thyroid function studies Recent Labs    04/28/19 0137  TSH 1.026   Anemia work up No results for input(s): VITAMINB12, FOLATE, FERRITIN, TIBC, IRON, RETICCTPCT in the last 72 hours. Urinalysis    Component Value Date/Time   COLORURINE YELLOW 04/27/2019 1804   APPEARANCEUR CLOUDY (A) 04/27/2019 1804   LABSPEC 1.015 04/27/2019 1804   PHURINE 5.0 04/27/2019 1804   GLUCOSEU NEGATIVE 04/27/2019 1804   HGBUR SMALL (A) 04/27/2019 1804   BILIRUBINUR NEGATIVE 04/27/2019 1804   KETONESUR NEGATIVE 04/27/2019 1804   PROTEINUR 30 (A) 04/27/2019 1804   UROBILINOGEN 0.2 03/31/2010 0246   NITRITE POSITIVE (A) 04/27/2019 1804   LEUKOCYTESUR LARGE (A) 04/27/2019 1804   Sepsis Labs Invalid input(s): PROCALCITONIN,  WBC,  LACTICIDVEN Microbiology Recent Results (from the past 240 hour(s))  SARS CORONAVIRUS 2 (TAT 6-24 HRS) Nasopharyngeal Nasopharyngeal Swab     Status: None   Collection Time: 04/27/19  7:14 PM   Specimen: Nasopharyngeal Swab  Result Value Ref Range Status   SARS  Coronavirus 2 NEGATIVE NEGATIVE Final    Comment: (NOTE) SARS-CoV-2 target nucleic acids are NOT DETECTED. The SARS-CoV-2 RNA is generally detectable in upper and lower respiratory specimens during the acute phase of infection. Negative results  do not preclude SARS-CoV-2 infection, do not rule out co-infections with other pathogens, and should not be used as the sole basis for treatment or other patient management decisions. Negative results must be combined with clinical observations, patient history, and epidemiological information. The expected result is Negative. Fact Sheet for Patients: SugarRoll.be Fact Sheet for Healthcare Providers: https://www.woods-mathews.com/ This test is not yet approved or cleared by the [place] FDA and  has been authorized for detection and/or diagnosis of SARS-CoV-2 by FDA under an Emergency Use Authorization (EUA). This EUA will remain  in effect (meaning this test can be used) for the duration of the COVID-19 declaration under Section 56 4(b)(1) of the Act, 21 U.S.C. section 360bbb-3(b)(1), unless the authorization is terminated or revoked sooner. Performed at [facility], [place] [address], [place], [place] [address]      Time coordinating discharge: Over 30 minutes  SIGNED:   Darliss Cheney, MD  [facility] 04/28/2019, 11:30 AM  If 7PM-7AM, please contact night-coverage [email or web address]

## 2019-04-28 NOTE — Discharge Instructions (Signed)
Urinary Tract Infection, Adult A urinary tract infection (UTI) is an infection of any part of the urinary tract. The urinary tract includes:  The kidneys.  The ureters.  The bladder.  The urethra. These organs make, store, and get rid of pee (urine) in the body. What are the causes? This is caused by germs (bacteria) in your genital area. These germs grow and cause swelling (inflammation) of your urinary tract. What increases the risk? You are more likely to develop this condition if:  You have a small, thin tube (catheter) to drain pee.  You cannot control when you pee or poop (incontinence).  You are female, and: ? You use these methods to prevent pregnancy:  A medicine that kills sperm (spermicide).  A device that blocks sperm (diaphragm). ? You have low levels of a female hormone (estrogen). ? You are pregnant.  You have genes that add to your risk.  You are sexually active.  You take antibiotic medicines.  You have trouble peeing because of: ? A prostate that is bigger than normal, if you are female. ? A blockage in the part of your body that drains pee from the bladder (urethra). ? A kidney stone. ? A nerve condition that affects your bladder (neurogenic bladder). ? Not getting enough to drink. ? Not peeing often enough.  You have other conditions, such as: ? Diabetes. ? A weak disease-fighting system (immune system). ? Sickle cell disease. ? Gout. ? Injury of the spine. What are the signs or symptoms? Symptoms of this condition include:  Needing to pee right away (urgently).  Peeing often.  Peeing small amounts often.  Pain or burning when peeing.  Blood in the pee.  Pee that smells bad or not like normal.  Trouble peeing.  Pee that is cloudy.  Fluid coming from the vagina, if you are female.  Pain in the belly or lower back. Other symptoms include:  Throwing up (vomiting).  No urge to eat.  Feeling mixed up (confused).  Being tired  and grouchy (irritable).  A fever.  Watery poop (diarrhea). How is this treated? This condition may be treated with:  Antibiotic medicine.  Other medicines.  Drinking enough water. Follow these instructions at home:  Medicines  Take over-the-counter and prescription medicines only as told by your doctor.  If you were prescribed an antibiotic medicine, take it as told by your doctor. Do not stop taking it even if you start to feel better. General instructions  Make sure you: ? Pee until your bladder is empty. ? Do not hold pee for a long time. ? Empty your bladder after sex. ? Wipe from front to back after pooping if you are a female. Use each tissue one time when you wipe.  Drink enough fluid to keep your pee pale yellow.  Keep all follow-up visits as told by your doctor. This is important. Contact a doctor if:  You do not get better after 1-2 days.  Your symptoms go away and then come back. Get help right away if:  You have very bad back pain.  You have very bad pain in your lower belly.  You have a fever.  You are sick to your stomach (nauseous).  You are throwing up. Summary  A urinary tract infection (UTI) is an infection of any part of the urinary tract.  This condition is caused by germs in your genital area.  There are many risk factors for a UTI. These include having a small, thin   tube to drain pee and not being able to control when you pee or poop.  Treatment includes antibiotic medicines for germs.  Drink enough fluid to keep your pee pale yellow. This information is not intended to replace advice given to you by your health care provider. Make sure you discuss any questions you have with your health care provider. Document Revised: 02/24/2018 Document Reviewed: 09/16/2017 Elsevier Patient Education  2020 Elsevier Inc.  

## 2019-04-28 NOTE — ED Notes (Signed)
RN to RN report given to Drew on 6N with no questions or concerns.

## 2019-04-28 NOTE — Progress Notes (Signed)
Patient arrived in 6N via stretcher from ED, alert orientedx3. Patient one assist using walker, Vital signs taken as follow: Temp: 98. 6, BP: 83/62 (69), HR: 79, RR: 20 and Spo2: 92% on RA. Notified MD about blood pressure, was given orders for 500 ml bolus of nacl then continuous nacl at 75. Patient oriented to room and bed controls. Patient verbalized she uses CPAP at night, Called RT for CPAP per pt's request. Left comfortably in bed with call bell at reach, will continue to monitor within remainder of shift.

## 2019-04-28 NOTE — Social Work (Signed)
CSW was alerted by pt RN Lauren that pt may need assistance with a ride home. CSW met with pt at bedside. Introduced self, role, reason for visit. Pt still seeing if her family can pick her up but we went over the [employer] and she is agreeable to assistance with a ride if she does end up needing that today.   Westley Hummer, MSW, [facility]

## 2019-04-28 NOTE — Progress Notes (Signed)
RN called about pt requesting CPAP.  Pt had arrived from ED.  Pt placed on auto titrate because she did not know her settings.  PT is tolerating well. RT will continue to monitor.

## 2019-04-29 LAB — URINE CULTURE: Culture: >=100000 — AB

## 2019-05-02 LAB — CULTURE, BLOOD (ROUTINE X 2)
Culture: NO GROWTH
Culture: NO GROWTH

## 2019-05-04 ENCOUNTER — Telehealth: Payer: Self-pay | Admitting: Nurse Practitioner

## 2019-05-04 NOTE — Telephone Encounter (Signed)
Pt called stating she went to the ER on 04/27/2019 and was prescribed a strong antibiotic, she now has a yeast infection and would like to know if she can come by and drop off a Urine spcimen for a prescription to be sent to her pharmacy. Pt is also scheduled to follow up with PCP on 05/15/19. Please advise

## 2019-05-04 NOTE — Telephone Encounter (Signed)
Will route to PCP 

## 2019-05-05 ENCOUNTER — Other Ambulatory Visit: Payer: Self-pay | Admitting: Nurse Practitioner

## 2019-05-05 ENCOUNTER — Other Ambulatory Visit: Payer: Self-pay

## 2019-05-05 MED ORDER — FLUCONAZOLE 150 MG PO TABS: 150.0000 mg | ORAL_TABLET | Freq: Once | ORAL | 1 refills | Status: AC

## 2019-05-05 NOTE — Telephone Encounter (Signed)
Spoke to patient and informed on lab results. Pt. Understood.  

## 2019-05-05 NOTE — Telephone Encounter (Signed)
Diflucan has been sent

## 2019-05-08 ENCOUNTER — Ambulatory Visit: Payer: 59 | Admitting: Obstetrics and Gynecology

## 2019-05-08 ENCOUNTER — Ambulatory Visit (INDEPENDENT_AMBULATORY_CARE_PROVIDER_SITE_OTHER): Payer: 59 | Admitting: *Deleted

## 2019-05-08 DIAGNOSIS — I6349 Cerebral infarction due to embolism of other cerebral artery: Secondary | ICD-10-CM | POA: Diagnosis not present

## 2019-05-08 LAB — CUP PACEART REMOTE DEVICE CHECK
Date Time Interrogation Session: 20210214230215
Implantable Pulse Generator Implant Date: 20201002

## 2019-05-09 NOTE — Progress Notes (Signed)
ILR Remote 

## 2019-05-15 ENCOUNTER — Ambulatory Visit: Payer: 59 | Attending: Nurse Practitioner | Admitting: Nurse Practitioner

## 2019-05-15 ENCOUNTER — Other Ambulatory Visit: Payer: Self-pay

## 2019-05-17 ENCOUNTER — Telehealth: Payer: Self-pay

## 2019-05-17 ENCOUNTER — Other Ambulatory Visit: Payer: Self-pay | Admitting: Gastroenterology

## 2019-05-17 NOTE — Telephone Encounter (Signed)
Called pt to request that she go for another COVID test prior to her scheduled colonoscopy on 05/19/19 since her last COVID test was on 04/27/19.  No answer.  LMTCB.

## 2019-05-17 NOTE — Telephone Encounter (Signed)
Pt returned call.  Pt said that she went for COVID test this morning at 10:30 am.

## 2019-05-18 LAB — SARS CORONAVIRUS 2 (TAT 6-24 HRS): SARS Coronavirus 2: NEGATIVE

## 2019-05-19 ENCOUNTER — Ambulatory Visit: Payer: 59 | Admitting: Internal Medicine

## 2019-05-19 ENCOUNTER — Encounter: Payer: Self-pay | Admitting: Gastroenterology

## 2019-05-19 ENCOUNTER — Other Ambulatory Visit: Payer: Self-pay

## 2019-05-19 ENCOUNTER — Ambulatory Visit (AMBULATORY_SURGERY_CENTER): Payer: 59 | Admitting: Gastroenterology

## 2019-05-19 VITALS — BP 80/54 | HR 76 | Temp 95.3°F | Resp 12 | Ht 63.0 in | Wt 220.0 lb

## 2019-05-19 DIAGNOSIS — D123 Benign neoplasm of transverse colon: Secondary | ICD-10-CM

## 2019-05-19 DIAGNOSIS — K5730 Diverticulosis of large intestine without perforation or abscess without bleeding: Secondary | ICD-10-CM

## 2019-05-19 DIAGNOSIS — K648 Other hemorrhoids: Secondary | ICD-10-CM

## 2019-05-19 DIAGNOSIS — R194 Change in bowel habit: Secondary | ICD-10-CM

## 2019-05-19 DIAGNOSIS — D125 Benign neoplasm of sigmoid colon: Secondary | ICD-10-CM

## 2019-05-19 MED ORDER — SODIUM CHLORIDE 0.9 % IV SOLN: 500.0000 mL | Freq: Once | INTRAVENOUS | Status: DC

## 2019-05-19 NOTE — Op Note (Signed)
[facility] Patient Name: Pamela Carney Procedure Date: 05/19/2019 9:43 AM MRN: [id number] Endoscopist: Thornton Park MD, MD Age: 54 Referring MD:  Date of Birth: Mar 19, 1966 Gender: Female Account #: [id number] Procedure:                Colonoscopy Indications:              Change in bowel habits                           No prior colon cancer screening                           No known family history of colon cancer or polyps Medicines:                Monitored Anesthesia Care Procedure:                Pre-Anesthesia Assessment:                           - Prior to the procedure, a History and Physical                            was performed, and patient medications and                            allergies were reviewed. The patient's tolerance of                            previous anesthesia was also reviewed. The risks                            and benefits of the procedure and the sedation                            options and risks were discussed with the patient.                            All questions were answered, and informed consent                            was obtained. Prior Anticoagulants: The patient has                            taken Plavix (clopidogrel), last dose was 5 days                            prior to procedure. ASA Grade Assessment: III - A                            patient with severe systemic disease. After                            reviewing the risks and benefits, the patient was  deemed in satisfactory condition to undergo the                            procedure.                           After obtaining informed consent, the colonoscope                            was passed under direct vision. Throughout the                            procedure, the patient's blood pressure, pulse, and                            oxygen saturations were monitored continuously. The   Colonoscope was introduced through the anus and                            advanced to the 3 cm into the ileum. A second                            forward view of the right colon was performed. The                            colonoscopy was performed without difficulty. The                            patient tolerated the procedure well. The quality                            of the bowel preparation was good. The terminal                            ileum, ileocecal valve, appendiceal orifice, and                            rectum were photographed. Scope In: 9:53:32 AM Scope Out: 10:14:55 AM Scope Withdrawal Time: 0 hours 15 minutes 40 seconds  Total Procedure Duration: 0 hours 21 minutes 23 seconds  Findings:                 Hemorrhoids were found on perianal exam.                           Multiple small and large-mouthed diverticula were                            found in the entire colon.                           A less than 1 mm polyp was found in the distal                            sigmoid colon. The polyp was sessile.  The polyp was                            removed with a cold snare. Resection and retrieval                            were complete. Estimated blood loss was minimal.                           A 3 mm polyp was found in the hepatic flexure. The                            polyp was sessile. The polyp was removed with a                            cold snare. Resection and retrieval were complete.                            Estimated blood loss was minimal.                           The exam was otherwise without abnormality on                            direct and retroflexion views. Complications:            No immediate complications. Estimated blood loss:                            Minimal. Estimated Blood Loss:     Estimated blood loss was minimal. Impression:               - Hemorrhoids found on perianal exam.                           - Diverticulosis in  the entire examined colon.                           - One less than 1 mm polyp in the distal sigmoid                            colon, removed with a cold snare. Resected and                            retrieved.                           - One 3 mm polyp at the hepatic flexure, removed                            with a cold snare. Resected and retrieved.                           - The examination was otherwise normal on direct  and retroflexion views. Recommendation:           - Patient has a contact number available for                            emergencies. The signs and symptoms of potential                            delayed complications were discussed with the                            patient. Return to normal activities tomorrow.                            Written discharge instructions were provided to the                            patient.                           - High fiber diet. Drink at least 64 ounces of                            water daily. Use a daily stool bulking agent such                            as Metamucil.                           - Continue present medications.                           - Resume Plavix today.                           - Await pathology results.                           - Repeat colonoscopy date to be determined after                            pending pathology results are reviewed for                            surveillance.                           - Emerging evidence supports eating a diet of                            fruits, vegetables, grains, calcium, and yogurt                            while reducing red meat and alcohol may reduce the  risk of colon cancer.                           - Thank you for allowing me to be involved in your                            colon cancer prevention. Thornton Park MD, MD 05/19/2019 10:21:58 AM This report has been signed  electronically.

## 2019-05-19 NOTE — Progress Notes (Signed)
Temp-JB VS-CW  Pt's states no medical or surgical changes since previsit or office visit.  

## 2019-05-19 NOTE — Progress Notes (Signed)
Called to room to assist during endoscopic procedure.  Patient ID and intended procedure confirmed with present staff. Received instructions for my participation in the procedure from the performing physician.  

## 2019-05-19 NOTE — Patient Instructions (Addendum)
Handouts given for polyps, hemorrhoids, diverticulosis and high fiber diet.  Resume PLAVIX TODAY  YOU HAD AN ENDOSCOPIC PROCEDURE TODAY AT [facility]:   Refer to the procedure report that was given to you for any specific questions about what was found during the examination.  If the procedure report does not answer your questions, please call your gastroenterologist to clarify.  If you requested that your care partner not be given the details of your procedure findings, then the procedure report has been included in a sealed envelope for you to review at your convenience later.  YOU SHOULD EXPECT: Some feelings of bloating in the abdomen. Passage of more gas than usual.  Walking can help get rid of the air that was put into your GI tract during the procedure and reduce the bloating. If you had a lower endoscopy (such as a colonoscopy or flexible sigmoidoscopy) you may notice spotting of blood in your stool or on the toilet paper. If you underwent a bowel prep for your procedure, you may not have a normal bowel movement for a few days.  Please Note:  You might notice some irritation and congestion in your nose or some drainage.  This is from the oxygen used during your procedure.  There is no need for concern and it should clear up in a day or so.  SYMPTOMS TO REPORT IMMEDIATELY:   Following lower endoscopy (colonoscopy or flexible sigmoidoscopy):  Excessive amounts of blood in the stool  Significant tenderness or worsening of abdominal pains  Swelling of the abdomen that is new, acute  Fever of 100F or higher  For urgent or emergent issues, a gastroenterologist can be reached at any hour by calling [phone].   DIET:  We do recommend a small meal at first, but then you may proceed to your regular diet.  Drink plenty of fluids but you should avoid alcoholic beverages for 24 hours.  ACTIVITY:  You should plan to take it easy for the rest of today and you should NOT  DRIVE or use heavy machinery until tomorrow (because of the sedation medicines used during the test).    FOLLOW UP: Our staff will call the number listed on your records 48-72 hours following your procedure to check on you and address any questions or concerns that you may have regarding the information given to you following your procedure. If we do not reach you, we will leave a message.  We will attempt to reach you two times.  During this call, we will ask if you have developed any symptoms of COVID 19. If you develop any symptoms (ie: fever, flu-like symptoms, shortness of breath, cough etc.) before then, please call [phone].  If you test positive for Covid 19 in the 2 weeks post procedure, please call and report this information to [place].    If any biopsies were taken you will be contacted by phone or by letter within the next 1-3 weeks.  Please call us at [phone] if you have not heard about the biopsies in 3 weeks.    SIGNATURES/CONFIDENTIALITY: You and/or your care partner have signed paperwork which will be entered into your electronic medical record.  These signatures attest to the fact that that the information above on your After Visit Summary has been reviewed and is understood.  Full responsibility of the confidentiality of this discharge information lies with you and/or your care-partner.

## 2019-05-19 NOTE — Progress Notes (Signed)
PT taken to PACU. Monitors in place. VSS. Report given to RN. 

## 2019-05-23 ENCOUNTER — Encounter: Payer: Self-pay | Admitting: Gastroenterology

## 2019-05-23 ENCOUNTER — Telehealth: Payer: Self-pay

## 2019-05-23 NOTE — Telephone Encounter (Signed)
  Follow up Call-  Call back number 05/19/2019  Post procedure Call Back phone  # [phone]  Permission to leave phone message Yes  Some recent data might be hidden     Patient questions:  Do you have a fever, pain , or abdominal swelling? No. Pain Score  0 *  Have you tolerated food without any problems? Yes.    Have you been able to return to your normal activities? Yes.    Do you have any questions about your discharge instructions: Diet   No. Medications  No. Follow up visit  No.  Do you have questions or concerns about your Care? No.  Actions: * If pain score is 4 or above: No action needed, pain <4.    1. Have you developed a fever since your procedure? No  2.   Have you had an respiratory symptoms (SOB or cough) since your procedure? No  3.   Have you tested positive for COVID 19 since your procedure? No  4.   Have you had any family members/close contacts diagnosed with the COVID 19 since your procedure?  No   If yes to any of these questions please route to Joylene John, RN and Alphonsa Gin, RN.

## 2019-05-25 ENCOUNTER — Ambulatory Visit: Payer: 59 | Attending: Nurse Practitioner | Admitting: Nurse Practitioner

## 2019-05-25 ENCOUNTER — Other Ambulatory Visit: Payer: Self-pay

## 2019-05-25 ENCOUNTER — Encounter: Payer: Self-pay | Admitting: Nurse Practitioner

## 2019-05-25 VITALS — BP 90/61 | HR 87 | Temp 96.4°F | Ht 63.0 in | Wt 223.6 lb

## 2019-05-25 DIAGNOSIS — Z794 Long term (current) use of insulin: Secondary | ICD-10-CM | POA: Diagnosis not present

## 2019-05-25 DIAGNOSIS — N760 Acute vaginitis: Secondary | ICD-10-CM

## 2019-05-25 DIAGNOSIS — E785 Hyperlipidemia, unspecified: Secondary | ICD-10-CM | POA: Diagnosis not present

## 2019-05-25 DIAGNOSIS — E1165 Type 2 diabetes mellitus with hyperglycemia: Secondary | ICD-10-CM | POA: Diagnosis not present

## 2019-05-25 DIAGNOSIS — J302 Other seasonal allergic rhinitis: Secondary | ICD-10-CM

## 2019-05-25 DIAGNOSIS — G5793 Unspecified mononeuropathy of bilateral lower limbs: Secondary | ICD-10-CM

## 2019-05-25 DIAGNOSIS — IMO0002 Reserved for concepts with insufficient information to code with codable children: Secondary | ICD-10-CM

## 2019-05-25 DIAGNOSIS — R300 Dysuria: Secondary | ICD-10-CM

## 2019-05-25 DIAGNOSIS — I6349 Cerebral infarction due to embolism of other cerebral artery: Secondary | ICD-10-CM

## 2019-05-25 LAB — POCT URINALYSIS DIP (CLINITEK)
Bilirubin, UA: NEGATIVE
Glucose, UA: NEGATIVE mg/dL
Ketones, POC UA: NEGATIVE mg/dL
Nitrite, UA: POSITIVE — AB
Spec Grav, UA: 1.02 (ref 1.010–1.025)
Urobilinogen, UA: 0.2 E.U./dL
pH, UA: 6 (ref 5.0–8.0)

## 2019-05-25 LAB — GLUCOSE, POCT (MANUAL RESULT ENTRY): POC Glucose: 168 mg/dl — AB (ref 70–99)

## 2019-05-25 MED ORDER — CETIRIZINE HCL 10 MG PO TABS: 10.0000 mg | ORAL_TABLET | Freq: Every day | ORAL | 2 refills | Status: AC

## 2019-05-25 MED ORDER — CLOPIDOGREL BISULFATE 75 MG PO TABS: 75.0000 mg | ORAL_TABLET | Freq: Every day | ORAL | 2 refills | Status: AC

## 2019-05-25 MED ORDER — DULOXETINE HCL 30 MG PO CPEP: 30.0000 mg | ORAL_CAPSULE | Freq: Every day | ORAL | 3 refills | Status: DC

## 2019-05-25 MED ORDER — METFORMIN HCL 500 MG PO TABS: 1000.0000 mg | ORAL_TABLET | Freq: Two times a day (BID) | ORAL | 0 refills | Status: DC

## 2019-05-25 MED ORDER — FLUCONAZOLE 150 MG PO TABS: 150.0000 mg | ORAL_TABLET | Freq: Once | ORAL | 1 refills | Status: AC

## 2019-05-25 NOTE — Progress Notes (Signed)
Assessment & Plan:  Pamela Carney was seen today for diabetes.  Diagnoses and all orders for this visit:  Uncontrolled type 2 diabetes mellitus, with long-term current use of insulin (HCC) -     Glucose (CBG) -     metFORMIN (GLUCOPHAGE) 500 MG tablet; Take 2 tablets (1,000 mg total) by mouth 2 (two) times daily with a meal. Continue blood sugar control as discussed in office today, low carbohydrate diet, and regular physical exercise as tolerated, 150 minutes per week (30 min each day, 5 days per week, or 50 min 3 days per week). Keep blood sugar logs with fasting goal of 90-130 mg/dl, post prandial (after you eat) less than 180.  For Hypoglycemia: BS <60 and Hyperglycemia BS >400; contact the clinic ASAP. Annual eye exams and foot exams are recommended.   Dyslipidemia, goal LDL below 70 INSTRUCTIONS: Work on a low fat, heart healthy diet and participate in regular aerobic exercise program by working out at least 150 minutes per week; 5 days a week-30 minutes per day. Avoid red meat/beef/steak,  fried foods. junk foods, sodas, sugary drinks, unhealthy snacking, alcohol and smoking.  Drink at least 80 oz of water per day and monitor your carbohydrate intake daily.    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  Neuropathy involving both lower extremities -     DULoxetine (CYMBALTA) 30 MG capsule; Take 1 capsule (30 mg total) by mouth daily. May increase to 2 tablets by mouth daily after 2 weeks if needed (Patient taking differently: Take 30 mg by mouth 2 (two) times daily. )  Acute vaginitis -     fluconazole (DIFLUCAN) 150 MG tablet; Take 1 tablet (150 mg total) by mouth once for 1 dose.  Seasonal allergies -     cetirizine (ZYRTEC) 10 MG tablet; Take 1 tablet (10 mg total) by  mouth daily.    Patient has been counseled on age-appropriate routine health concerns for screening and prevention. These are reviewed and up-to-date. Referrals have been placed accordingly. Immunizations are up-to-date or declined.    Subjective:   Chief Complaint  Patient presents with  . Diabetes    Pt. stated her sugar has been high. Pt. would like to try something else for allergies, Benedryl is not working.    HPI Pamela Carney 53 y.o. female presents to office today for follow up. Feels like she has a bladder infection and also endorses increased vaginal discharge which she believes is a yeast infection.   DM TYPE 2 She has her blood glucose meter with her today with the following averages.  7 day 172 14 day 164 30 day 160 90 day 157 Well controlled. She denies any symptoms of hyperglycemia. Current medications include 15 units daily and metformin 1000 mg BID. Overdue for eye exam. She takes duloxetine for peripheral neuropathy.  Lab Results  Component Value Date   HGBA1C 7.4 (H) 04/28/2019   Dyslipidemia LDL not at goal of <70. Taking atorvastatin 80 mg daily. Denies any statin intolerance or myalgias.  Lab Results  Component Value Date   [place] 99 12/15/2018    Essential Hypertension Well controlled. Not on ACE or ARB for DM due to low blood pressure. Denies chest  pain, shortness of breath, palpitations, lightheadedness, dizziness, headaches or BLE edema.  BP Readings from Last 3 Encounters:  06/23/19 (!) 116/55  05/29/19 95/68  05/25/19 90/61    Review of Systems  Constitutional: Negative for fever, malaise/fatigue and weight loss.  HENT: Negative.  Negative for nosebleeds.   Eyes: Negative.  Negative for blurred vision, double vision and photophobia.  Respiratory: Negative.  Negative for cough and shortness of breath.   Cardiovascular: Negative.  Negative for chest pain, palpitations and leg swelling.  Gastrointestinal: Negative.  Negative for heartburn,  nausea and vomiting.  Musculoskeletal: Negative.  Negative for myalgias.  Neurological: Negative.  Negative for dizziness, focal weakness, seizures and headaches.  Psychiatric/Behavioral: Negative.  Negative for suicidal ideas.    Past Medical History:  Diagnosis Date  . Abnormal Pap smear   . AKI (acute kidney injury) ([place]) 12/28/2017  . Anxiety   . Arthritis    bil knees, neck  . Bipolar 1 disorder ([place])   . Cataract    Mild  . Cholelithiasis 12/23/2017   noted on CT renal, pt unaware  . Chronic kidney disease (CKD), stage III (moderate)   . Cystitis   . Depression   . Diabetes mellitus without complication ([place])    type 2  . Genital warts    Hx of genital  . GERD (gastroesophageal reflux disease)   . Heart murmur    childhood  . Hematuria   . History of blood transfusion   . History of ectopic pregnancy   . History of gestational diabetes   . History of kidney stones   . History of sepsis    after ectopic pregnancy  . Hyperlipidemia   . Hypertension   . Idiopathic peripheral neuropathy    both feet  . Leg ulcer, left ([place])   . Low back pain   . Migraines   . Obesity   . Oral candidiasis   . Pneumonia   . PONV (postoperative nausea and vomiting)    prolonged sedation  . Recurrent UTI   . 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 Sigmoid diverticulosis 12/23/2017   noted on CT renal, pt unaware  . Sleep apnea    uses cpap  . Stroke [facility])    Cryptogenic  . Ulcer of foot ([place])    Left  . Weakness     Past Surgical History:  Procedure Laterality Date  . BUBBLE STUDY  12/23/2018   Procedure: BUBBLE STUDY;  Surgeon: Lelon Perla, MD;  Location: [facility];  Service: Cardiovascular;;  . CERVICAL CONE BIOPSY    . CESAREAN SECTION     x3  . CYSTOSCOPY W/ URETERAL STENT PLACEMENT Right 12/15/2018   Procedure: Cystoscopy, right retrograde ureteropyelogram, fluoroscopic  interpretation, right double-J stent placement (24 cm x 6 [place]);  Surgeon: Franchot Gallo, MD;  Location: [facility];  Service: Urology;  Laterality: Right;  . CYSTOSCOPY WITH RETROGRADE PYELOGRAM, URETEROSCOPY AND STENT PLACEMENT Right 01/27/2019   Procedure: CYSTOSCOPY WITH RETROGRADE PYELOGRAM, URETEROSCOPY AND STENT PLACEMENT; BASKET STONE RETRIEVAL;  Surgeon: Alexis Frock, MD;  Location: [facility];  Service: Urology;  Laterality: Right;  75 MINS  . CYSTOSCOPY/URETEROSCOPY/HOLMIUM LASER/STENT PLACEMENT Right 02/04/2018   Procedure: CYSTOSCOPY/URETEROSCOPY/RETROGRADE PYELOGRAM/HOLMIUM LASER/STENT PLACEMENT;  Surgeon: Alexis Frock, MD;  Location: [facility];  Service: Urology;  Laterality: Right;  . CYSTOSCOPY/URETEROSCOPY/HOLMIUM LASER/STENT PLACEMENT Right 02/07/2018   Procedure: CYSTOSCOPY LEFT STENT EXCHANGE;  Surgeon: Alexis Frock,  MD;  Location: [facility];  Service: Urology;  Laterality: Right;  . DILATION AND CURETTAGE OF UTERUS    . ECTOPIC PREGNANCY SURGERY    . IR NEPHROSTOMY PLACEMENT LEFT  12/25/2017  . LOOP RECORDER INSERTION N/A 12/23/2018   Procedure: LOOP RECORDER INSERTION;  Surgeon: Constance Haw, MD;  Location: [facility];  Service: Cardiovascular;  Laterality: N/A;  . NEPHROLITHOTOMY Left 02/04/2018   Procedure: NEPHROLITHOTOMY PERCUTANEOUS;  Surgeon: Alexis Frock, MD;  Location: [facility];  Service: Urology;  Laterality: Left;  3 HRS  . NEPHROLITHOTOMY Left 02/07/2018   Procedure: SECOND LOOK NEPHROLITHOTOMY PERCUTANEOUS;  Surgeon: Alexis Frock, MD;  Location: [facility];  Service: Urology;  Laterality: Left;  2 HRS  . OVARIAN CYST REMOVAL    . TEE WITHOUT CARDIOVERSION N/A 12/23/2018   Procedure: TRANSESOPHAGEAL ECHOCARDIOGRAM (TEE);  Surgeon: Lelon Perla, MD;  Location[facility] END[facility];  Service: Cardiovascular;  Laterality: N/A;  . UNILATERAL SALPINGECTOMY     Pt unsure of which fallopian tube was removed  . WISDOM TOOTH EXTRACTION      Family History    Problem Relation Age of Onset  . Diabetes Paternal Grandfather   . COPD Paternal Grandfather   . Heart disease Paternal Grandfather   . Cancer Paternal Grandfather        bone  . Cancer Paternal Grandmother   . Heart disease Paternal Grandmother   . Cancer Father   . Hypertension Father   . Heart attack Father   . Alcohol abuse Father   . Depression Father   . Heart failure Mother   . Diabetes Mother   . Heart disease Mother   . Hyperlipidemia Mother   . Hypertension Mother   . Heart attack Mother   . Peripheral vascular disease Mother   . Depression Mother   . COPD Mother   . Hypertension Sister   . Heart attack Sister   . Stroke Maternal Grandmother   . Colon cancer Neg Hx   . Colon polyps Neg Hx   . Esophageal cancer Neg Hx   . Rectal cancer Neg Hx   . Stomach cancer Neg Hx     Social History Reviewed with no changes to be made today.   No facility-administered medications prior to visit.   Outpatient Medications Prior to Visit  Medication Sig Dispense Refill  . atorvastatin (LIPITOR) 80 MG tablet Take 1 tablet (80 mg total) by mouth daily at 6 PM. 90 tablet 2  . AZO CRANBERRY GUMMIES PO Take 2 tablets by mouth daily.    . Blood Glucose Monitoring Suppl (ONETOUCH VERIO) w/Device KIT Use to check blood sugars every morning fasting and 2 hours after largest meal 1 kit 0  . glucose blood (ONETOUCH VERIO) test strip Use to check blood sugars every morning fasting and 2 hours after largest meal 200 each 3  . indapamide (LOZOL) 2.5 MG tablet Take 2.5 mg by mouth every evening.    . Insulin Glargine (LANTUS) 100 UNIT/ML Solostar Pen Inject 15 Units into the skin at bedtime. 15 mL prn  . Insulin Pen Needle (PEN NEEDLES) 31G X 8 MM MISC 1 each by Does not apply route daily. 100 each 3  . Multiple Vitamins-Minerals (ONE-A-DAY WOMENS 50+ ADVANTAGE) TABS Take 1 tablet by mouth daily.    . ONE TOUCH LANCETS MISC Use to check blood sugars every morning fasting and 2 hours after  largest meal 200 each 3  . topiramate (TOPAMAX) 100 MG tablet Take 1  tablet (100 mg total) by mouth 2 (two) times daily. 60 tablet 2  . clopidogrel (PLAVIX) 75 MG tablet Take 75 mg by mouth daily.    Marland Kitchen gabapentin (NEURONTIN) 300 MG capsule Take 1 capsule (300 mg total) by mouth 4 (four) times daily. 360 capsule 0  . metFORMIN (GLUCOPHAGE) 500 MG tablet Take 1 tablet (500 mg total) by mouth 2 (two) times daily with a meal. (Patient taking differently: Take 1,000 mg by mouth 2 (two) times daily with a meal. ) 180 tablet 0  . ondansetron (ZOFRAN) 4 MG tablet Take 1 tablet (4 mg total) by mouth every 8 (eight) hours as needed for nausea or vomiting. 20 tablet 1    Allergies  Allergen Reactions  . Sulfa Antibiotics Hives and Swelling    Swelling site not recalled  . Latex Rash  . Tape Rash    Not tolerated well       Objective:    BP 90/61 (BP Location: Left Arm, Patient Position: Sitting, Cuff Size: Normal)   Pulse 87   Temp (!) 96.4 F (35.8 C) (Temporal)   Ht _0  (1.6 m)   Wt 223 lb 9.6 oz (101.4 kg)   SpO2 96%   BMI 39.61 kg/m  Wt Readings from Last 3 Encounters:  06/20/19 200 lb (90.7 kg)  05/29/19 217 lb (98.4 kg)  05/25/19 223 lb 9.6 oz (101.4 kg)    Physical Exam Vitals and nursing note reviewed.  Constitutional:      Appearance: She is well-developed.  HENT:     Head: Normocephalic and atraumatic.  Cardiovascular:     Rate and Rhythm: Normal rate and regular rhythm.     Heart sounds: Normal heart sounds. No murmur. No friction rub. No gallop.   Pulmonary:     Effort: Pulmonary effort is normal. No tachypnea or respiratory distress.     Breath sounds: Normal breath sounds. No decreased breath sounds, wheezing, rhonchi or rales.  Chest:     Chest wall: No tenderness.  Abdominal:     General: Bowel sounds are normal.     Palpations: Abdomen is soft.  Musculoskeletal:        General: Normal range of motion.     Cervical back: Normal range of motion.  Skin:     General: Skin is warm and dry.  Neurological:     Mental Status: She is alert and oriented to person, place, and time.     Coordination: Coordination normal.  Psychiatric:        Behavior: Behavior normal. Behavior is cooperative.        Thought Content: Thought content normal.        Judgment: Judgment normal.          Patient has been counseled extensively about nutrition and exercise as well as the importance of adherence with medications and regular follow-up. The patient was given clear instructions to go to ER or return to medical center if symptoms don't improve, worsen or new problems develop. The patient verbalized understanding.   Follow-up: Return for PAP SMEAR.   [name], FNP-BC [facility] [place], [place]   06/23/2019, 6:13 PM

## 2019-05-25 NOTE — Telephone Encounter (Signed)
error 

## 2019-05-26 ENCOUNTER — Other Ambulatory Visit: Payer: Self-pay | Admitting: Nurse Practitioner

## 2019-05-26 ENCOUNTER — Other Ambulatory Visit: Payer: Self-pay

## 2019-05-26 MED ORDER — NITROFURANTOIN MONOHYD MACRO 100 MG PO CAPS: 100.0000 mg | ORAL_CAPSULE | Freq: Two times a day (BID) | ORAL | 0 refills | Status: AC

## 2019-05-26 MED ORDER — ONDANSETRON HCL 4 MG PO TABS: 4.0000 mg | ORAL_TABLET | Freq: Three times a day (TID) | ORAL | 1 refills | Status: AC | PRN

## 2019-05-26 NOTE — Telephone Encounter (Signed)
Pt. Request refill on Zofran.  CMA sent in refill.

## 2019-05-29 ENCOUNTER — Other Ambulatory Visit: Payer: Self-pay

## 2019-05-29 ENCOUNTER — Encounter: Payer: Self-pay | Admitting: Nurse Practitioner

## 2019-05-29 ENCOUNTER — Ambulatory Visit (HOSPITAL_BASED_OUTPATIENT_CLINIC_OR_DEPARTMENT_OTHER): Payer: 59 | Admitting: Nurse Practitioner

## 2019-05-29 ENCOUNTER — Other Ambulatory Visit (HOSPITAL_COMMUNITY)
Admission: RE | Admit: 2019-05-29 | Discharge: 2019-05-29 | Disposition: A | Discharge status: Home / Self Care | Payer: 59 | Source: Ambulatory Visit | Attending: Nurse Practitioner | Admitting: Nurse Practitioner

## 2019-05-29 VITALS — BP 95/68 | HR 98 | Temp 95.5°F | Ht 63.0 in | Wt 217.0 lb

## 2019-05-29 DIAGNOSIS — Z30431 Encounter for routine checking of intrauterine contraceptive device: Secondary | ICD-10-CM

## 2019-05-29 DIAGNOSIS — B372 Candidiasis of skin and nail: Secondary | ICD-10-CM | POA: Diagnosis not present

## 2019-05-29 DIAGNOSIS — IMO0002 Reserved for concepts with insufficient information to code with codable children: Secondary | ICD-10-CM

## 2019-05-29 DIAGNOSIS — E1165 Type 2 diabetes mellitus with hyperglycemia: Secondary | ICD-10-CM | POA: Diagnosis not present

## 2019-05-29 DIAGNOSIS — Z794 Long term (current) use of insulin: Secondary | ICD-10-CM | POA: Diagnosis not present

## 2019-05-29 DIAGNOSIS — Z124 Encounter for screening for malignant neoplasm of cervix: Secondary | ICD-10-CM | POA: Diagnosis present

## 2019-05-29 LAB — GLUCOSE, POCT (MANUAL RESULT ENTRY): POC Glucose: 213 mg/dl — AB (ref 70–99)

## 2019-05-29 MED ORDER — NYSTATIN 100000 UNIT/GM EX CREA: 1.0000 "application " | TOPICAL_CREAM | Freq: Two times a day (BID) | CUTANEOUS | 1 refills | Status: DC

## 2019-05-29 NOTE — Progress Notes (Signed)
Assessment & Plan:  Pamela Carney was seen today for gynecologic exam.  Diagnoses and all orders for this visit:  Encounter for Papanicolaou smear for cervical cancer screening -     Cytology - PAP -     Cervicovaginal ancillary only  Uncontrolled type 2 diabetes mellitus, with long-term current use of insulin (HCC) -     Glucose (CBG)  IUD check up -     Ambulatory referral to Gynecology She states she can not remember when her IUD was inserted but it was many years ago.   Candidiasis, intertrigo -     nystatin cream (MYCOSTATIN); Apply 1 application topically 2 (two) times daily.    Patient has been counseled on age-appropriate routine health concerns for screening and prevention. These are reviewed and up-to-date. Referrals have been placed accordingly. Immunizations are up-to-date or declined.    Subjective:   Chief Complaint  Patient presents with  . Gynecologic Exam    Pt. is here for a pap smear.    HPI Pamela Carney 54 y.o. female presents to office today for PAP smear.    Review of Systems  Constitutional: Negative.  Negative for chills, fever, malaise/fatigue and weight loss.  Respiratory: Negative.  Negative for cough, shortness of breath and wheezing.   Cardiovascular: Negative.  Negative for chest pain, orthopnea and leg swelling.  Gastrointestinal: Negative for abdominal pain.  Genitourinary: Negative.  Negative for flank pain.  Skin: Negative.  Negative for rash.  Psychiatric/Behavioral: Negative for suicidal ideas.    Past Medical History:  Diagnosis Date  . Abnormal Pap smear   . AKI (acute kidney injury) ([place]) 12/28/2017  . Anxiety   . Arthritis    bil knees, neck  . Bipolar 1 disorder ([place])   . Cataract    Mild  . Cholelithiasis 12/23/2017   noted on CT renal, pt unaware  . Chronic kidney disease (CKD), stage III (moderate)   . Cystitis   . Depression   . Diabetes mellitus without complication ([place])    type 2  . Genital warts    Hx of  genital  . GERD (gastroesophageal reflux disease)   . Heart murmur    childhood  . Hematuria   . History of blood transfusion   . History of ectopic pregnancy   . History of gestational diabetes   . History of kidney stones   . History of sepsis    after ectopic pregnancy  . Hyperlipidemia   . Hypertension   . Idiopathic peripheral neuropathy    both feet  . Leg ulcer, left ([place])   . Low back pain   . Migraines   . Obesity   . Oral candidiasis   . Pneumonia   . PONV (postoperative nausea and vomiting)    prolonged sedation  . Recurrent UTI   . 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 Sigmoid diverticulosis 12/23/2017   noted on CT renal, pt unaware  . Sleep apnea    uses cpap  . 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 Ulcer of foot ([place])    Left  . Weakness     Past Surgical History:  Procedure Laterality Date  . BUBBLE STUDY  12/23/2018   Procedure: BUBBLE STUDY;  Surgeon: Lelon Perla, MD;  Location: [facility] ENDOSCOPY;  Service: Cardiovascular;;  . CERVICAL CONE BIOPSY    . CESAREAN SECTION  x3  . CYSTOSCOPY W/ URETERAL STENT PLACEMENT Right 12/15/2018   Procedure: Cystoscopy, right retrograde ureteropyelogram, fluoroscopic interpretation, right double-J stent placement (24 cm x 6 [place]);  Surgeon: Franchot Gallo, MD;  Location: [facility];  Service: Urology;  Laterality: Right;  . CYSTOSCOPY WITH RETROGRADE PYELOGRAM, URETEROSCOPY AND STENT PLACEMENT Right 01/27/2019   Procedure: CYSTOSCOPY WITH RETROGRADE PYELOGRAM, URETEROSCOPY AND STENT PLACEMENT; BASKET STONE RETRIEVAL;  Surgeon: Alexis Frock, MD;  Location: [facility] ORS;  Service: Urology;  Laterality: Right;  75 MINS  . CYSTOSCOPY/URETEROSCOPY/HOLMIUM LASER/STENT PLACEMENT Right 02/04/2018   Procedure: CYSTOSCOPY/URETEROSCOPY/RETROGRADE PYELOGRAM/HOLMIUM LASER/STENT PLACEMENT;  Surgeon: Alexis Frock, MD;  Location: [facility] ORS;  Service:  Urology;  Laterality: Right;  . CYSTOSCOPY/URETEROSCOPY/HOLMIUM LASER/STENT PLACEMENT Right 02/07/2018   Procedure: CYSTOSCOPY LEFT STENT EXCHANGE;  Surgeon: Alexis Frock, MD;  Location: [facility] ORS;  Service: Urology;  Laterality: Right;  . DILATION AND CURETTAGE OF UTERUS    . ECTOPIC PREGNANCY SURGERY    . IR NEPHROSTOMY PLACEMENT LEFT  12/25/2017  . LOOP RECORDER INSERTION N/A 12/23/2018   Procedure: LOOP RECORDER INSERTION;  Surgeon: Constance Haw, MD;  Location: [place] CV LAB;  Service: Cardiovascular;  Laterality: N/A;  . NEPHROLITHOTOMY Left 02/04/2018   Procedure: NEPHROLITHOTOMY PERCUTANEOUS;  Surgeon: Alexis Frock, MD;  Location: [facility];  Service: Urology;  Laterality: Left;  3 HRS  . NEPHROLITHOTOMY Left 02/07/2018   Procedure: SECOND LOOK NEPHROLITHOTOMY PERCUTANEOUS;  Surgeon: Alexis Frock, MD;  Location: [facility];  Service: Urology;  Laterality: Left;  2 HRS  . OVARIAN CYST REMOVAL    . TEE WITHOUT CARDIOVERSION N/A 12/23/2018   Procedure: TRANSESOPHAGEAL ECHOCARDIOGRAM (TEE);  Surgeon: Lelon Perla, MD;  Location[facility] ENDOSCOPY;  Service: Cardiovascular;  Laterality: N/A;  . UNILATERAL SALPINGECTOMY     Pt unsure of which fallopian tube was removed  . WISDOM TOOTH EXTRACTION      Family History  Problem Relation Age of Onset  . Diabetes Paternal Grandfather   . COPD Paternal Grandfather   . Heart disease Paternal Grandfather   . Cancer Paternal Grandfather        bone  . Cancer Paternal Grandmother   . Heart disease Paternal Grandmother   . Cancer Father   . Hypertension Father   . Heart attack Father   . Alcohol abuse Father   . Depression Father   . Heart failure Mother   . Diabetes Mother   . Heart disease Mother   . Hyperlipidemia Mother   . Hypertension Mother   . Heart attack Mother   . Peripheral vascular disease Mother   . Depression Mother   . COPD Mother   . Hypertension Sister   . Heart attack Sister   . Stroke Maternal  Grandmother   . Colon cancer Neg Hx   . Colon polyps Neg Hx   . Esophageal cancer Neg Hx   . Rectal cancer Neg Hx   . Stomach cancer Neg Hx     Social History Reviewed with no changes to be made today.   Outpatient Medications Prior to Visit  Medication Sig Dispense Refill  . atorvastatin (LIPITOR) 80 MG tablet Take 1 tablet (80 mg total) by mouth daily at 6 PM. 90 tablet 2  . AZO CRANBERRY GUMMIES PO Take 2 tablets by mouth daily.    . Blood Glucose Monitoring Suppl (ONETOUCH VERIO) w/Device KIT Use to check blood sugars every morning fasting and 2 hours after largest meal 1 kit 0  . cetirizine (ZYRTEC) 10 MG tablet Take  1 tablet (10 mg total) by mouth daily. 90 tablet 2  . clopidogrel (PLAVIX) 75 MG tablet Take 1 tablet (75 mg total) by mouth daily. 90 tablet 2  . DULoxetine (CYMBALTA) 30 MG capsule Take 1 capsule (30 mg total) by mouth daily. May increase to 2 tablets by mouth daily after 2 weeks if needed 90 capsule 3  . glucose blood (ONETOUCH VERIO) test strip Use to check blood sugars every morning fasting and 2 hours after largest meal 200 each 3  . indapamide (LOZOL) 2.5 MG tablet Take 2.5 mg by mouth every evening.    . Insulin Glargine (LANTUS) 100 UNIT/ML Solostar Pen Inject 15 Units into the skin at bedtime. 15 mL prn  . Insulin Pen Needle (PEN NEEDLES) 31G X 8 MM MISC 1 each by Does not apply route daily. 100 each 3  . metFORMIN (GLUCOPHAGE) 500 MG tablet Take 2 tablets (1,000 mg total) by mouth 2 (two) times daily with a meal. 360 tablet 0  . Multiple Vitamins-Minerals (ONE-A-DAY WOMENS 50+ ADVANTAGE) TABS Take 1 tablet by mouth daily.    . nitrofurantoin, macrocrystal-monohydrate, (MACROBID) 100 MG capsule Take 1 capsule (100 mg total) by mouth 2 (two) times daily for 5 days. 10 capsule 0  . ondansetron (ZOFRAN) 4 MG tablet Take 1 tablet (4 mg total) by mouth every 8 (eight) hours as needed for nausea or vomiting. 20 tablet 1  . ONE TOUCH LANCETS MISC Use to check blood  sugars every morning fasting and 2 hours after largest meal 200 each 3  . topiramate (TOPAMAX) 100 MG tablet Take 1 tablet (100 mg total) by mouth 2 (two) times daily. 60 tablet 2   No facility-administered medications prior to visit.    Allergies  Allergen Reactions  . Sulfa Antibiotics Hives and Swelling    Swelling site not recalled  . Latex Rash  . Tape Rash    Not tolerated well       Objective:    BP 95/68 (BP Location: Right Arm, Patient Position: Sitting, Cuff Size: Normal)   Pulse 98   Temp (!) 95.5 F (35.3 C) (Temporal)   Ht _0  (1.6 m)   Wt 217 lb (98.4 kg)   SpO2 97%   BMI 38.44 kg/m  Wt Readings from Last 3 Encounters:  05/29/19 217 lb (98.4 kg)  05/25/19 223 lb 9.6 oz (101.4 kg)  05/19/19 220 lb (99.8 kg)    Physical Exam Exam conducted with a chaperone present.  Constitutional:      Appearance: She is well-developed.  HENT:     Head: Normocephalic.  Cardiovascular:     Rate and Rhythm: Normal rate and regular rhythm.     Heart sounds: Normal heart sounds.  Pulmonary:     Effort: Pulmonary effort is normal.     Breath sounds: Normal breath sounds.  Abdominal:     General: Bowel sounds are normal.     Palpations: Abdomen is soft.     Hernia: There is no hernia in the left inguinal area.  Genitourinary:    Exam position: Lithotomy position.     Labia:        Right: No rash, tenderness, lesion or injury.        Left: No rash, tenderness, lesion or injury.      Vagina: Normal. No signs of injury and foreign body. No vaginal discharge, erythema, tenderness or bleeding.     Cervix: No cervical motion tenderness or friability.  Uterus: Not deviated and not enlarged.      Adnexa:        Right: No mass, tenderness or fullness.         Left: No mass, tenderness or fullness.       Rectum: Normal. No external hemorrhoid.          Comments: Candidiasis of skin folds Lymphadenopathy:     Lower Body: No right inguinal adenopathy. No left  inguinal adenopathy.  Skin:    General: Skin is warm and dry.  Neurological:     Mental Status: She is alert and oriented to person, place, and time.  Psychiatric:        Behavior: Behavior normal.        Thought Content: Thought content normal.        Judgment: Judgment normal.          Patient has been counseled extensively about nutrition and exercise as well as the importance of adherence with medications and regular follow-up. The patient was given clear instructions to go to ER or return to medical center if symptoms don't improve, worsen or new problems develop. The patient verbalized understanding.   Follow-up: Return in about 3 months (around 08/29/2019) for DM.   [name], FNP-BC [facility] [place], [place]   05/29/2019, 12:46 PM

## 2019-05-31 LAB — CERVICOVAGINAL ANCILLARY ONLY
Bacterial Vaginitis (gardnerella): NEGATIVE
Candida Glabrata: NEGATIVE
Candida Vaginitis: NEGATIVE
Chlamydia: NEGATIVE
Comment: NEGATIVE
Comment: NEGATIVE
Comment: NEGATIVE
Comment: NEGATIVE
Comment: NEGATIVE
Comment: NORMAL
Neisseria Gonorrhea: NEGATIVE
Trichomonas: NEGATIVE

## 2019-05-31 LAB — URINE CULTURE

## 2019-06-01 ENCOUNTER — Telehealth: Payer: Self-pay | Admitting: Nurse Practitioner

## 2019-06-01 LAB — CYTOLOGY - PAP
Adequacy: ABSENT
Comment: NEGATIVE
Diagnosis: NEGATIVE
High risk HPV: NEGATIVE

## 2019-06-01 NOTE — Telephone Encounter (Signed)
Patient returned call regarding lab results. Please f/u

## 2019-06-02 NOTE — Telephone Encounter (Signed)
Spoke to patient and informed on lab results. Pt. Understood.  

## 2019-06-08 ENCOUNTER — Ambulatory Visit (INDEPENDENT_AMBULATORY_CARE_PROVIDER_SITE_OTHER): Payer: 59 | Admitting: *Deleted

## 2019-06-08 DIAGNOSIS — I6349 Cerebral infarction due to embolism of other cerebral artery: Secondary | ICD-10-CM | POA: Diagnosis not present

## 2019-06-08 LAB — CUP PACEART REMOTE DEVICE CHECK
Date Time Interrogation Session: 20210317230542
Implantable Pulse Generator Implant Date: 20201002

## 2019-06-09 ENCOUNTER — Ambulatory Visit: Payer: 59 | Admitting: Nurse Practitioner

## 2019-06-09 NOTE — Progress Notes (Signed)
ILR Remote 

## 2019-06-20 ENCOUNTER — Inpatient Hospital Stay (HOSPITAL_COMMUNITY)
Admission: EM | Admit: 2019-06-20 | Discharge: 2019-06-24 | DRG: 683 | Disposition: A | Discharge status: Home / Self Care | Payer: 59 | Attending: Student | Admitting: Student

## 2019-06-20 ENCOUNTER — Encounter (HOSPITAL_COMMUNITY): Payer: Self-pay

## 2019-06-20 DIAGNOSIS — J309 Allergic rhinitis, unspecified: Secondary | ICD-10-CM | POA: Diagnosis present

## 2019-06-20 DIAGNOSIS — E875 Hyperkalemia: Secondary | ICD-10-CM | POA: Diagnosis present

## 2019-06-20 DIAGNOSIS — Z8249 Family history of ischemic heart disease and other diseases of the circulatory system: Secondary | ICD-10-CM

## 2019-06-20 DIAGNOSIS — R110 Nausea: Secondary | ICD-10-CM

## 2019-06-20 DIAGNOSIS — Z91040 Latex allergy status: Secondary | ICD-10-CM

## 2019-06-20 DIAGNOSIS — Z79899 Other long term (current) drug therapy: Secondary | ICD-10-CM

## 2019-06-20 DIAGNOSIS — Z818 Family history of other mental and behavioral disorders: Secondary | ICD-10-CM

## 2019-06-20 DIAGNOSIS — E1165 Type 2 diabetes mellitus with hyperglycemia: Secondary | ICD-10-CM | POA: Diagnosis present

## 2019-06-20 DIAGNOSIS — K219 Gastro-esophageal reflux disease without esophagitis: Secondary | ICD-10-CM | POA: Diagnosis present

## 2019-06-20 DIAGNOSIS — Z960 Presence of urogenital implants: Secondary | ICD-10-CM | POA: Diagnosis present

## 2019-06-20 DIAGNOSIS — E669 Obesity, unspecified: Secondary | ICD-10-CM | POA: Diagnosis present

## 2019-06-20 DIAGNOSIS — N1831 Chronic kidney disease, stage 3a: Secondary | ICD-10-CM | POA: Diagnosis present

## 2019-06-20 DIAGNOSIS — Z6835 Body mass index (BMI) 35.0-35.9, adult: Secondary | ICD-10-CM

## 2019-06-20 DIAGNOSIS — G609 Hereditary and idiopathic neuropathy, unspecified: Secondary | ICD-10-CM | POA: Diagnosis present

## 2019-06-20 DIAGNOSIS — Z7902 Long term (current) use of antithrombotics/antiplatelets: Secondary | ICD-10-CM

## 2019-06-20 DIAGNOSIS — Z87442 Personal history of urinary calculi: Secondary | ICD-10-CM

## 2019-06-20 DIAGNOSIS — Z975 Presence of (intrauterine) contraceptive device: Secondary | ICD-10-CM

## 2019-06-20 DIAGNOSIS — Z91018 Allergy to other foods: Secondary | ICD-10-CM

## 2019-06-20 DIAGNOSIS — Z794 Long term (current) use of insulin: Secondary | ICD-10-CM

## 2019-06-20 DIAGNOSIS — E1169 Type 2 diabetes mellitus with other specified complication: Secondary | ICD-10-CM

## 2019-06-20 DIAGNOSIS — E876 Hypokalemia: Secondary | ICD-10-CM | POA: Diagnosis not present

## 2019-06-20 DIAGNOSIS — I129 Hypertensive chronic kidney disease with stage 1 through stage 4 chronic kidney disease, or unspecified chronic kidney disease: Secondary | ICD-10-CM | POA: Diagnosis present

## 2019-06-20 DIAGNOSIS — N179 Acute kidney failure, unspecified: Principal | ICD-10-CM

## 2019-06-20 DIAGNOSIS — E861 Hypovolemia: Secondary | ICD-10-CM | POA: Diagnosis present

## 2019-06-20 DIAGNOSIS — E785 Hyperlipidemia, unspecified: Secondary | ICD-10-CM | POA: Diagnosis present

## 2019-06-20 DIAGNOSIS — N390 Urinary tract infection, site not specified: Secondary | ICD-10-CM | POA: Diagnosis present

## 2019-06-20 DIAGNOSIS — E872 Acidosis: Secondary | ICD-10-CM | POA: Diagnosis present

## 2019-06-20 DIAGNOSIS — E1122 Type 2 diabetes mellitus with diabetic chronic kidney disease: Secondary | ICD-10-CM | POA: Diagnosis present

## 2019-06-20 DIAGNOSIS — Z833 Family history of diabetes mellitus: Secondary | ICD-10-CM

## 2019-06-20 DIAGNOSIS — Z87440 Personal history of urinary (tract) infections: Secondary | ICD-10-CM

## 2019-06-20 DIAGNOSIS — E1142 Type 2 diabetes mellitus with diabetic polyneuropathy: Secondary | ICD-10-CM | POA: Diagnosis present

## 2019-06-20 DIAGNOSIS — R000 Tachycardia, unspecified: Secondary | ICD-10-CM | POA: Diagnosis present

## 2019-06-20 DIAGNOSIS — B370 Candidal stomatitis: Secondary | ICD-10-CM | POA: Diagnosis present

## 2019-06-20 DIAGNOSIS — F419 Anxiety disorder, unspecified: Secondary | ICD-10-CM | POA: Diagnosis present

## 2019-06-20 DIAGNOSIS — N3001 Acute cystitis with hematuria: Secondary | ICD-10-CM

## 2019-06-20 DIAGNOSIS — F17210 Nicotine dependence, cigarettes, uncomplicated: Secondary | ICD-10-CM | POA: Diagnosis present

## 2019-06-20 DIAGNOSIS — F319 Bipolar disorder, unspecified: Secondary | ICD-10-CM | POA: Diagnosis present

## 2019-06-20 DIAGNOSIS — G4730 Sleep apnea, unspecified: Secondary | ICD-10-CM | POA: Diagnosis present

## 2019-06-20 DIAGNOSIS — R531 Weakness: Secondary | ICD-10-CM

## 2019-06-20 DIAGNOSIS — Z20822 Contact with and (suspected) exposure to covid-19: Secondary | ICD-10-CM | POA: Diagnosis present

## 2019-06-20 DIAGNOSIS — E860 Dehydration: Secondary | ICD-10-CM

## 2019-06-20 DIAGNOSIS — Z9079 Acquired absence of other genital organ(s): Secondary | ICD-10-CM

## 2019-06-20 DIAGNOSIS — G8929 Other chronic pain: Secondary | ICD-10-CM | POA: Diagnosis present

## 2019-06-20 DIAGNOSIS — Z8673 Personal history of transient ischemic attack (TIA), and cerebral infarction without residual deficits: Secondary | ICD-10-CM

## 2019-06-20 DIAGNOSIS — Z882 Allergy status to sulfonamides status: Secondary | ICD-10-CM

## 2019-06-20 LAB — URINALYSIS, ROUTINE W REFLEX MICROSCOPIC
Bilirubin Urine: NEGATIVE
Glucose, UA: NEGATIVE mg/dL
Ketones, ur: NEGATIVE mg/dL
Nitrite: NEGATIVE
Protein, ur: 100 mg/dL — AB
Specific Gravity, Urine: 1.015 (ref 1.005–1.030)
WBC, UA: >50 WBC/hpf — ABNORMAL HIGH (ref 0–5)
pH: 5 (ref 5.0–8.0)

## 2019-06-20 LAB — COMPREHENSIVE METABOLIC PANEL
ALT: 20 U/L (ref 0–44)
AST: 17 U/L (ref 15–41)
Albumin: 4.3 g/dL (ref 3.5–5.0)
Alkaline Phosphatase: 126 U/L (ref 38–126)
Anion gap: 21 — ABNORMAL HIGH (ref 5–15)
BUN: 116 mg/dL — ABNORMAL HIGH (ref 6–20)
CO2: 15 mmol/L — ABNORMAL LOW (ref 22–32)
Calcium: 10.2 mg/dL (ref 8.9–10.3)
Chloride: 102 mmol/L (ref 98–111)
Creatinine, Ser: 8.13 mg/dL — ABNORMAL HIGH (ref 0.44–1.00)
GFR calc Af Amer: 6 mL/min — ABNORMAL LOW (ref >60)
GFR calc non Af Amer: 5 mL/min — ABNORMAL LOW (ref >60)
Glucose, Bld: 125 mg/dL — ABNORMAL HIGH (ref 70–99)
Potassium: 5.2 mmol/L — ABNORMAL HIGH (ref 3.5–5.1)
Sodium: 138 mmol/L (ref 135–145)
Total Bilirubin: 0.3 mg/dL (ref 0.3–1.2)
Total Protein: 8.3 g/dL — ABNORMAL HIGH (ref 6.5–8.1)

## 2019-06-20 LAB — CBC
HCT: 50.8 % — ABNORMAL HIGH (ref 36.0–46.0)
Hemoglobin: 16.3 g/dL — ABNORMAL HIGH (ref 12.0–15.0)
MCH: 31 pg (ref 26.0–34.0)
MCHC: 32.1 g/dL (ref 30.0–36.0)
MCV: 96.6 fL (ref 80.0–100.0)
Platelets: 383 10*3/uL (ref 150–400)
RBC: 5.26 MIL/uL — ABNORMAL HIGH (ref 3.87–5.11)
RDW: 13.9 % (ref 11.5–15.5)
WBC: 10.1 10*3/uL (ref 4.0–10.5)
nRBC: 0 % (ref 0.0–0.2)

## 2019-06-20 LAB — LIPASE, BLOOD: Lipase: 44 U/L (ref 11–51)

## 2019-06-20 MED ORDER — SODIUM CHLORIDE 0.9% FLUSH
3.0000 mL | Freq: Once | INTRAVENOUS | Status: AC
  Administered 2019-06-21: 3 mL via INTRAVENOUS

## 2019-06-20 NOTE — ED Triage Notes (Signed)
Pt came in POV with the c/o of Nausea and Diarrhea and not being able to eat for 2 week. Pt is also complaining she is having thrush. Pt denies Covid-19 exposure and denies fever.

## 2019-06-21 ENCOUNTER — Ambulatory Visit: Payer: 59 | Admitting: Adult Health

## 2019-06-21 DIAGNOSIS — N179 Acute kidney failure, unspecified: Principal | ICD-10-CM | POA: Diagnosis present

## 2019-06-21 DIAGNOSIS — B370 Candidal stomatitis: Secondary | ICD-10-CM

## 2019-06-21 DIAGNOSIS — R110 Nausea: Secondary | ICD-10-CM

## 2019-06-21 DIAGNOSIS — E1165 Type 2 diabetes mellitus with hyperglycemia: Secondary | ICD-10-CM | POA: Diagnosis present

## 2019-06-21 DIAGNOSIS — Z20822 Contact with and (suspected) exposure to covid-19: Secondary | ICD-10-CM | POA: Diagnosis present

## 2019-06-21 DIAGNOSIS — K219 Gastro-esophageal reflux disease without esophagitis: Secondary | ICD-10-CM | POA: Diagnosis present

## 2019-06-21 DIAGNOSIS — G8929 Other chronic pain: Secondary | ICD-10-CM | POA: Diagnosis present

## 2019-06-21 DIAGNOSIS — N19 Unspecified kidney failure: Secondary | ICD-10-CM

## 2019-06-21 DIAGNOSIS — R7989 Other specified abnormal findings of blood chemistry: Secondary | ICD-10-CM | POA: Diagnosis not present

## 2019-06-21 DIAGNOSIS — E861 Hypovolemia: Secondary | ICD-10-CM | POA: Diagnosis present

## 2019-06-21 DIAGNOSIS — E860 Dehydration: Secondary | ICD-10-CM

## 2019-06-21 DIAGNOSIS — N1831 Chronic kidney disease, stage 3a: Secondary | ICD-10-CM | POA: Diagnosis present

## 2019-06-21 DIAGNOSIS — R000 Tachycardia, unspecified: Secondary | ICD-10-CM | POA: Diagnosis present

## 2019-06-21 DIAGNOSIS — E1122 Type 2 diabetes mellitus with diabetic chronic kidney disease: Secondary | ICD-10-CM | POA: Diagnosis present

## 2019-06-21 DIAGNOSIS — E1142 Type 2 diabetes mellitus with diabetic polyneuropathy: Secondary | ICD-10-CM | POA: Diagnosis present

## 2019-06-21 DIAGNOSIS — F17210 Nicotine dependence, cigarettes, uncomplicated: Secondary | ICD-10-CM | POA: Diagnosis present

## 2019-06-21 DIAGNOSIS — T83511A Infection and inflammatory reaction due to indwelling urethral catheter, initial encounter: Secondary | ICD-10-CM | POA: Diagnosis not present

## 2019-06-21 DIAGNOSIS — E876 Hypokalemia: Secondary | ICD-10-CM | POA: Diagnosis not present

## 2019-06-21 DIAGNOSIS — N3000 Acute cystitis without hematuria: Secondary | ICD-10-CM

## 2019-06-21 DIAGNOSIS — J309 Allergic rhinitis, unspecified: Secondary | ICD-10-CM | POA: Diagnosis present

## 2019-06-21 DIAGNOSIS — G609 Hereditary and idiopathic neuropathy, unspecified: Secondary | ICD-10-CM | POA: Diagnosis present

## 2019-06-21 DIAGNOSIS — E785 Hyperlipidemia, unspecified: Secondary | ICD-10-CM | POA: Diagnosis present

## 2019-06-21 DIAGNOSIS — F319 Bipolar disorder, unspecified: Secondary | ICD-10-CM | POA: Diagnosis not present

## 2019-06-21 DIAGNOSIS — E875 Hyperkalemia: Secondary | ICD-10-CM

## 2019-06-21 DIAGNOSIS — E1169 Type 2 diabetes mellitus with other specified complication: Secondary | ICD-10-CM

## 2019-06-21 DIAGNOSIS — F419 Anxiety disorder, unspecified: Secondary | ICD-10-CM | POA: Diagnosis present

## 2019-06-21 DIAGNOSIS — I129 Hypertensive chronic kidney disease with stage 1 through stage 4 chronic kidney disease, or unspecified chronic kidney disease: Secondary | ICD-10-CM | POA: Diagnosis present

## 2019-06-21 DIAGNOSIS — G4730 Sleep apnea, unspecified: Secondary | ICD-10-CM | POA: Diagnosis present

## 2019-06-21 DIAGNOSIS — Z794 Long term (current) use of insulin: Secondary | ICD-10-CM

## 2019-06-21 DIAGNOSIS — E872 Acidosis: Secondary | ICD-10-CM

## 2019-06-21 DIAGNOSIS — N3001 Acute cystitis with hematuria: Secondary | ICD-10-CM | POA: Diagnosis present

## 2019-06-21 LAB — GLUCOSE, CAPILLARY
Glucose-Capillary: 71 mg/dL (ref 70–99)
Glucose-Capillary: 82 mg/dL (ref 70–99)

## 2019-06-21 LAB — PHOSPHORUS: Phosphorus: 5.1 mg/dL — ABNORMAL HIGH (ref 2.5–4.6)

## 2019-06-21 LAB — MAGNESIUM: Magnesium: 1.8 mg/dL (ref 1.7–2.4)

## 2019-06-21 LAB — CBC
HCT: 42.8 % (ref 36.0–46.0)
Hemoglobin: 13.6 g/dL (ref 12.0–15.0)
MCH: 31.5 pg (ref 26.0–34.0)
MCHC: 31.8 g/dL (ref 30.0–36.0)
MCV: 99.1 fL (ref 80.0–100.0)
Platelets: 254 10*3/uL (ref 150–400)
RBC: 4.32 MIL/uL (ref 3.87–5.11)
RDW: 14.1 % (ref 11.5–15.5)
WBC: 9.8 10*3/uL (ref 4.0–10.5)
nRBC: 0 % (ref 0.0–0.2)

## 2019-06-21 LAB — CBG MONITORING, ED
Glucose-Capillary: 112 mg/dL — ABNORMAL HIGH (ref 70–99)
Glucose-Capillary: 107 mg/dL — ABNORMAL HIGH (ref 70–99)

## 2019-06-21 LAB — BASIC METABOLIC PANEL
Anion gap: 17 — ABNORMAL HIGH (ref 5–15)
BUN: 108 mg/dL — ABNORMAL HIGH (ref 6–20)
CO2: 13 mmol/L — ABNORMAL LOW (ref 22–32)
Calcium: 8.5 mg/dL — ABNORMAL LOW (ref 8.9–10.3)
Chloride: 108 mmol/L (ref 98–111)
Creatinine, Ser: 6.67 mg/dL — ABNORMAL HIGH (ref 0.44–1.00)
GFR calc Af Amer: 8 mL/min — ABNORMAL LOW (ref >60)
GFR calc non Af Amer: 7 mL/min — ABNORMAL LOW (ref >60)
Glucose, Bld: 98 mg/dL (ref 70–99)
Potassium: 4.4 mmol/L (ref 3.5–5.1)
Sodium: 138 mmol/L (ref 135–145)

## 2019-06-21 LAB — LACTIC ACID, PLASMA
Lactic Acid, Venous: 2.5 mmol/L (ref 0.5–1.9)
Lactic Acid, Venous: 1.3 mmol/L (ref 0.5–1.9)

## 2019-06-21 LAB — SARS CORONAVIRUS 2 (TAT 6-24 HRS): SARS Coronavirus 2: NEGATIVE

## 2019-06-21 MED ORDER — ATORVASTATIN CALCIUM 80 MG PO TABS
80.0000 mg | ORAL_TABLET | Freq: Every day | ORAL | Status: DC
  Administered 2019-06-21 – 2019-06-23 (×3): 80 mg via ORAL
  Filled 2019-06-21 (×4): qty 1

## 2019-06-21 MED ORDER — NYSTATIN 100000 UNIT/ML MT SUSP
5.0000 mL | Freq: Four times a day (QID) | OROMUCOSAL | Status: DC
  Administered 2019-06-21 – 2019-06-24 (×13): 500000 [IU] via ORAL
  Filled 2019-06-21 (×16): qty 5

## 2019-06-21 MED ORDER — METOCLOPRAMIDE HCL 5 MG/ML IJ SOLN: 10.0000 mg | Freq: Four times a day (QID) | INTRAMUSCULAR | Status: DC | PRN

## 2019-06-21 MED ORDER — HEPARIN SODIUM (PORCINE) 5000 UNIT/ML IJ SOLN
5000.0000 [IU] | Freq: Three times a day (TID) | INTRAMUSCULAR | Status: DC
  Administered 2019-06-21 – 2019-06-24 (×10): 5000 [IU] via SUBCUTANEOUS
  Filled 2019-06-21 (×10): qty 1

## 2019-06-21 MED ORDER — SODIUM CHLORIDE 0.9 % IV BOLUS
1000.0000 mL | Freq: Once | INTRAVENOUS | Status: AC
  Administered 2019-06-21: 1000 mL via INTRAVENOUS

## 2019-06-21 MED ORDER — SODIUM CHLORIDE 0.9 % IV SOLN: INTRAVENOUS | Status: DC

## 2019-06-21 MED ORDER — CLOPIDOGREL BISULFATE 75 MG PO TABS
75.0000 mg | ORAL_TABLET | Freq: Every day | ORAL | Status: DC
  Administered 2019-06-21 – 2019-06-24 (×4): 75 mg via ORAL
  Filled 2019-06-21 (×5): qty 1

## 2019-06-21 MED ORDER — DULOXETINE HCL 30 MG PO CPEP
30.0000 mg | ORAL_CAPSULE | Freq: Every day | ORAL | Status: DC
  Administered 2019-06-21 – 2019-06-24 (×4): 30 mg via ORAL
  Filled 2019-06-21 (×6): qty 1

## 2019-06-21 MED ORDER — FLUCONAZOLE 150 MG PO TABS
150.0000 mg | ORAL_TABLET | Freq: Once | ORAL | Status: AC
  Administered 2019-06-21: 03:00:00 150 mg via ORAL
  Filled 2019-06-21: qty 1

## 2019-06-21 MED ORDER — SODIUM CHLORIDE 0.9 % IV SOLN
1.0000 g | INTRAVENOUS | Status: AC
  Administered 2019-06-21 – 2019-06-22 (×2): 1 g via INTRAVENOUS
  Filled 2019-06-21 (×2): qty 1

## 2019-06-21 MED ORDER — INSULIN ASPART 100 UNIT/ML ~~LOC~~ SOLN
0.0000 [IU] | Freq: Three times a day (TID) | SUBCUTANEOUS | Status: DC
  Administered 2019-06-22 (×2): 1 [IU] via SUBCUTANEOUS
  Administered 2019-06-23: 16:00:00 2 [IU] via SUBCUTANEOUS
  Administered 2019-06-24: 08:00:00 1 [IU] via SUBCUTANEOUS

## 2019-06-21 MED ORDER — SODIUM CHLORIDE 0.9 % IV SOLN
1.0000 g | Freq: Once | INTRAVENOUS | Status: AC
  Administered 2019-06-21: 1 g via INTRAVENOUS
  Filled 2019-06-21: qty 10

## 2019-06-21 MED ORDER — METOCLOPRAMIDE HCL 5 MG/ML IJ SOLN
5.0000 mg | Freq: Once | INTRAMUSCULAR | Status: AC
  Administered 2019-06-21: 5 mg via INTRAVENOUS
  Filled 2019-06-21: qty 2

## 2019-06-21 MED ORDER — TOPIRAMATE 100 MG PO TABS
100.0000 mg | ORAL_TABLET | Freq: Two times a day (BID) | ORAL | Status: DC
  Administered 2019-06-21 – 2019-06-24 (×7): 100 mg via ORAL
  Filled 2019-06-21 (×5): qty 1
  Filled 2019-06-21 (×2): qty 4
  Filled 2019-06-21: qty 1

## 2019-06-21 NOTE — ED Notes (Signed)
MS   bfast ordered  

## 2019-06-21 NOTE — Progress Notes (Signed)
PROGRESS NOTE  Pamela Carney [id number] DOB: [birth year]   PCP: [name], NP  Patient is from: Home.  Occasionally uses cane at baseline.  DOA: 06/20/2019 LOS: 0  Brief Narrative / Interim history: 54 year old female with history of DM-2, CKD-3a, morbid obesity, recurrent UTIs, CVA, anxiety, depression and bipolar disorder presenting with nausea and vomiting and admitted for acute renal failure with azotemia.  Patient had nausea and diarrhea about 2 weeks ago.  Diarrhea lasted 4 to 5 days and resolved.  Nausea persisted despite trial of Zofran at home.  She has had poor p.o. intake due to nausea.  No fever or abdominal pain.  Also had dysuria.  Admits to occasional ibuprofen use.  In ED, slightly tachycardic.  K5.2. Cr 8.13 (baseline 1.2-1.5).  BUN 116.  CO2 15.  AG 21.  WBC 10.1.  Lactate 2.5>> 0.6.  UA with moderate LE.  Started on IV fluid, antiemetics and IV Rocephin and admitted for AKI and UTI.  Subjective: Seen and examined earlier this morning.  No major events earlier this morning.  Nausea improved.  Has not had bowel movement in the last 4 to 6 hours. Denies fever, chills, chest pain, dyspnea or abdominal pain.  No known sick contacts.  Objective: Vitals:   06/21/19 1100 06/21/19 1336 06/21/19 1435 06/21/19 1559  BP: 97/69  97/69 (!) 88/67  Pulse: 87 78 78 79  Resp:  16 17 16   Temp:   98.5 F (36.9 C) 97.8 F (36.6 C)  TempSrc:   Oral Oral  SpO2: 97% 96% 96% 95%  Weight:      Height:        Intake/Output Summary (Last 24 hours) at 06/21/2019 1741 Last data filed at 06/21/2019 0657 Gross per 24 hour  Intake 1000 ml  Output --  Net 1000 ml   Filed Weights   06/20/19 2059  Weight: 90.7 kg    Examination:  GENERAL: No acute distress.  Appears well.  HEENT: MMM.  Vision and hearing grossly intact.  NECK: Supple.  No apparent JVD.  RESP:  No IWOB. Good air movement bilaterally. CVS:  RRR. Heart sounds normal.  ABD/GI/GU: Bowel sounds present.  Soft. Non tender.  MSK/EXT:  Moves extremities. No apparent deformity. No edema.  SKIN: no apparent skin lesion or wound NEURO: Awake, alert and oriented appropriately.  No apparent focal neuro deficit. PSYCH: Calm. Normal affect.  Procedures:  None  Assessment & Plan: AKI on CKD-3A with azotemia/possible uremia: Baseline Cr 1.2-1.5 > 8.13 (admit) > 6.67: BUN 116 > 108.  AKI likely prerenal in the setting of dehydration due to diarrhea and nausea.  Nausea could be due to uremia.  -Continue IV normal saline at 100 cc an hour -Follow urine electrolytes -Consider renal ultrasound if no further improvement. -Avoid nephrotoxic meds -Continue monitoring  Nausea/diarrhea/dehydration: patient could have viral gastroenteritis although no significant abdominal pain.  GI symptoms could be due to uremia. -IV fluid as above -Continue antiemetics  Lactic acidosis/anion gap metabolic acidosis-likely due to dehydration/renal failure and uremia.  Improved -Continue monitoring  Hyperkalemia: Likely due to renal failure.  Resolved. -Continue monitoring  Urinary tract infection: Patient with dysuria and concerning UA. -Continue IV ceftriaxone -Order urine culture-cannot tell if this has been sent  Uncontrolled DM-2 with hyperglycemia: A1c 7.4% on 04/28/2019. Recent Labs    06/21/19 0803 06/21/19 1155 06/21/19 1714  GLUCAP 112* 107* 71  -Continue current regimen and a statin.  History of CVA: Stable.  No focal neuro  deficit. -Continue home Plavix and started  Anxiety/bipolar disorder/chronic pain/migraine -Continue home Cymbalta and topiramate.  Oral candidiasis -Received Diflucan in ED. -Nystatin suspension               DVT prophylaxis: Subcu heparin Code Status: Full code Family Communication: Patient and/or RN. Available if any question.   Discharge barrier: Admitted this morning with acute renal failure and nausea.  On IV fluid Patient is from: Home Final disposition:  Likely home once renal failure improves  Consultants: None   Microbiology summarized: [id number] negative. Blood cultures pending Urine cultures pending  Sch Meds:  Scheduled Meds: . atorvastatin  80 mg Oral q1800  . clopidogrel  75 mg Oral Daily  . DULoxetine  30 mg Oral Daily  . heparin  5,000 Units Subcutaneous Q8H  . insulin aspart  0-9 Units Subcutaneous TID WC  . nystatin  5 mL Oral QID  . topiramate  100 mg Oral BID   Continuous Infusions: . sodium chloride 100 mL/hr at 06/21/19 1413  . cefTRIAXone (ROCEPHIN)  IV     PRN Meds:.metoCLOPramide (REGLAN) injection  Antimicrobials: Anti-infectives (From admission, onward)   Start     Dose/Rate Route Frequency Ordered Stop   06/21/19 2200  cefTRIAXone (ROCEPHIN) 1 g in sodium chloride 0.9 % 100 mL IVPB     1 g 200 mL/hr over 30 Minutes Intravenous Every 24 hours 06/21/19 0414 06/23/19 2159   06/21/19 0130  cefTRIAXone (ROCEPHIN) 1 g in sodium chloride 0.9 % 100 mL IVPB     1 g 200 mL/hr over 30 Minutes Intravenous  Once 06/21/19 0115 06/21/19 0505   06/21/19 0115  fluconazole (DIFLUCAN) tablet 150 mg     150 mg Oral  Once 06/21/19 0115 06/21/19 0254       I have personally reviewed the following labs and images: CBC: Recent Labs  Lab 06/20/19 2111 06/21/19 0650  WBC 10.1 9.8  HGB 16.3* 13.6  HCT 50.8* 42.8  MCV 96.6 99.1  PLT 383 254   BMP &GFR Recent Labs  Lab 06/20/19 2111 06/21/19 0650  NA 138 138  K 5.2* 4.4  CL 102 108  CO2 15* 13*  GLUCOSE 125* 98  BUN 116* 108*  CREATININE 8.13* 6.67*  CALCIUM 10.2 8.5*  MG  --  1.8  PHOS  --  5.1*   Estimated Creatinine Clearance: 10.5 mL/min (A) (by C-G formula based on SCr of 6.67 mg/dL (H)). Liver & Pancreas: Recent Labs  Lab 06/20/19 2111  AST 17  ALT 20  ALKPHOS 126  BILITOT 0.3  PROT 8.3*  ALBUMIN 4.3   Recent Labs  Lab 06/20/19 2111  LIPASE 44   No results for input(s): AMMONIA in the last 168 hours. Diabetic: No results for  input(s): HGBA1C in the last 72 hours. Recent Labs  Lab 06/21/19 0803 06/21/19 1155 06/21/19 1714  GLUCAP 112* 107* 71   Cardiac Enzymes: No results for input(s): CKTOTAL, CKMB, CKMBINDEX, TROPONINI in the last 168 hours. No results for input(s): PROBNP in the last 8760 hours. Coagulation Profile: No results for input(s): INR, PROTIME in the last 168 hours. Thyroid Function Tests: No results for input(s): TSH, T4TOTAL, FREET4, T3FREE, THYROIDAB in the last 72 hours. Lipid Profile: No results for input(s): CHOL, HDL, LDLCALC, TRIG, CHOLHDL, LDLDIRECT in the last 72 hours. Anemia Panel: No results for input(s): VITAMINB12, FOLATE, FERRITIN, TIBC, IRON, RETICCTPCT in the last 72 hours. Urine analysis:    Component Value Date/Time   COLORURINE YELLOW 06/20/2019  2230   APPEARANCEUR CLOUDY (A) 06/20/2019 2230   LABSPEC 1.015 06/20/2019 2230   PHURINE 5.0 06/20/2019 2230   GLUCOSEU NEGATIVE 06/20/2019 2230   HGBUR SMALL (A) 06/20/2019 2230   BILIRUBINUR NEGATIVE 06/20/2019 2230   BILIRUBINUR negative 05/25/2019 1652   KETONESUR NEGATIVE 06/20/2019 2230   PROTEINUR 100 (A) 06/20/2019 2230   UROBILINOGEN 0.2 05/25/2019 1652   UROBILINOGEN 0.2 03/31/2010 0246   NITRITE NEGATIVE 06/20/2019 2230   LEUKOCYTESUR MODERATE (A) 06/20/2019 2230   Sepsis Labs: Invalid input(s): PROCALCITONIN, [place]  Microbiology: Recent Results (from the past 240 hour(s))  SARS CORONAVIRUS 2 (TAT 6-24 HRS) Nasopharyngeal Nasopharyngeal Swab     Status: None   Collection Time: 06/21/19  2:54 AM   Specimen: Nasopharyngeal Swab  Result Value Ref Range Status   SARS Coronavirus 2 NEGATIVE NEGATIVE Final    Comment: (NOTE) SARS-CoV-2 target nucleic acids are NOT DETECTED. The SARS-CoV-2 RNA is generally detectable in upper and lower respiratory specimens during the acute phase of infection. Negative results do not preclude SARS-CoV-2 infection, do not rule out co-infections with other pathogens, and  should not be used as the sole basis for treatment or other patient management decisions. Negative results must be combined with clinical observations, patient history, and epidemiological information. The expected result is Negative. Fact Sheet for Patients: SugarRoll.be Fact Sheet for Healthcare Providers: https://www.woods-mathews.com/ This test is not yet approved or cleared by the [place] FDA and  has been authorized for detection and/or diagnosis of SARS-CoV-2 by FDA under an Emergency Use Authorization (EUA). This EUA will remain  in effect (meaning this test can be used) for the duration of the COVID-19 declaration under Section 56 4(b)(1) of the Act, 21 U.S.C. section 360bbb-3(b)(1), unless the authorization is terminated or revoked sooner. Performed at [facility], [place] [address] [place], [place] [address]     Radiology Studies: No results found.  35 minutes with more than 50% spent in reviewing records, counseling patient/family and coordinating care.   Thad Osoria T. [place]  If 7PM-7AM, please contact night-coverage [email or web address] Password [facility] 06/21/2019, 5:41 PM

## 2019-06-21 NOTE — ED Notes (Signed)
Lunch Tray Ordered @ 1054. 

## 2019-06-21 NOTE — ED Provider Notes (Signed)
[facility] EMERGENCY DEPARTMENT Provider Note   CSN: [id number] Arrival date & time: 06/20/19  2005     History Chief Complaint  Patient presents with  . Abdominal Pain  . Weakness    Pamela Carney is a 54 y.o. female.  The history is provided by the patient and medical records.    54 year old female with history of anxiety, arthritis, chronic kidney disease stage III, diabetes, GERD, obesity, recurrent UTIs, history of thrush, peripheral neuropathy, presenting to the ED with feeling of persistent nausea and generalized weakness.  Reports last week she had diarrhea for 4 straight days.  States all episodes were watery and nonbloody.  States anytime she tried to eat or even smelt food she had episodes of diarrhea.  States that seems to have gotten better, however she is persistently nauseated.  Continues having sensitivity to smells inducing gagging but she has not had any true emesis.  She denies any abdominal pain.  She has been trying to drink water at home as much as she can get down.  She has not had very much food intake though.  She does feel like she has developed thrush again.  She states she is urinating 1-2 times daily if that, however it is burning and she is concerned she may have UTI again.  She has a hx of recurrent UTI's.  Denies flank pain.  She denies any noted fevers, no sick contacts.  States she lives with her son who is not displaying any similar symptoms.  She has not had any known Covid exposures.  Past Medical History:  Diagnosis Date  . Abnormal Pap smear   . AKI (acute kidney injury) ([place]) 12/28/2017  . Anxiety   . Arthritis    bil knees, neck  . Bipolar 1 disorder ([place])   . Cataract    Mild  . Cholelithiasis 12/23/2017   noted on CT renal, pt unaware  . Chronic kidney disease (CKD), stage III (moderate)   . Cystitis   . Depression   . Diabetes mellitus without complication ([place])    type 2  . Genital warts    Hx of genital  .  GERD (gastroesophageal reflux disease)   . Heart murmur    childhood  . Hematuria   . History of blood transfusion   . History of ectopic pregnancy   . History of gestational diabetes   . History of kidney stones   . History of sepsis    after ectopic pregnancy  . Hyperlipidemia   . Hypertension   . Idiopathic peripheral neuropathy    both feet  . Leg ulcer, left ([place])   . Low back pain   . Migraines   . Obesity   . Oral candidiasis   . Pneumonia   . PONV (postoperative nausea and vomiting)    prolonged sedation  . Recurrent UTI   . 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 Sigmoid diverticulosis 12/23/2017   noted on CT renal, pt unaware  . Sleep apnea    uses cpap  . Stroke [facility])    Cryptogenic  . Ulcer of foot ([place])    Left  . Weakness     Patient Active Problem List   Diagnosis Date Noted  . Acute metabolic encephalopathy 25/36/6440  . UTI (urinary tract infection) 04/27/2019  . Cerebral embolism with cerebral infarction 12/22/2018  . Septic shock ([place]) 12/15/2018  .  Ureteral stone with hydronephrosis   . Acute renal failure superimposed on stage 3 chronic kidney disease ([place])   . New onset type 2 diabetes mellitus ([place]) 02/01/2018  . Bipolar 1 disorder ([place]) 02/01/2018  . IUD (intrauterine device) in place 02/01/2018  . Obesity 02/01/2018  . Healthcare maintenance 02/01/2018  . CKD (chronic kidney disease), stage III 12/28/2017  . 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 Acute cystitis with hematuria   . Chronic interstitial nephritis 12/24/2017  . Generalized weakness 12/24/2017  . Sepsis ([place]) 12/24/2017  . Hypokalemia 12/24/2017  . Oral candidiasis 12/24/2017  . Neuropathy involving both lower extremities 01/31/2014  . Perimenopausal 12/29/2012  . Family planning, IUD (intrauterine device) check/reinsertion/removal 12/28/2012  . Pain 12/27/2012    Past Surgical History:    Procedure Laterality Date  . BUBBLE STUDY  12/23/2018   Procedure: BUBBLE STUDY;  Surgeon: Lelon Perla, MD;  Location: [facility] ENDOSCOPY;  Service: Cardiovascular;;  . CERVICAL CONE BIOPSY    . CESAREAN SECTION     x3  . CYSTOSCOPY W/ URETERAL STENT PLACEMENT Right 12/15/2018   Procedure: Cystoscopy, right retrograde ureteropyelogram, fluoroscopic interpretation, right double-J stent placement (24 cm x 6 [place]);  Surgeon: Franchot Gallo, MD;  Location: [facility];  Service: Urology;  Laterality: Right;  . CYSTOSCOPY WITH RETROGRADE PYELOGRAM, URETEROSCOPY AND STENT PLACEMENT Right 01/27/2019   Procedure: CYSTOSCOPY WITH RETROGRADE PYELOGRAM, URETEROSCOPY AND STENT PLACEMENT; BASKET STONE RETRIEVAL;  Surgeon: Alexis Frock, MD;  Location: [facility] ORS;  Service: Urology;  Laterality: Right;  75 MINS  . CYSTOSCOPY/URETEROSCOPY/HOLMIUM LASER/STENT PLACEMENT Right 02/04/2018   Procedure: CYSTOSCOPY/URETEROSCOPY/RETROGRADE PYELOGRAM/HOLMIUM LASER/STENT PLACEMENT;  Surgeon: Alexis Frock, MD;  Location: [facility];  Service: Urology;  Laterality: Right;  . CYSTOSCOPY/URETEROSCOPY/HOLMIUM LASER/STENT PLACEMENT Right 02/07/2018   Procedure: CYSTOSCOPY LEFT STENT EXCHANGE;  Surgeon: Alexis Frock, MD;  Location: [facility];  Service: Urology;  Laterality: Right;  . DILATION AND CURETTAGE OF UTERUS    . ECTOPIC PREGNANCY SURGERY    . IR NEPHROSTOMY PLACEMENT LEFT  12/25/2017  . LOOP RECORDER INSERTION N/A 12/23/2018   Procedure: LOOP RECORDER INSERTION;  Surgeon: Constance Haw, MD;  Location: [facility];  Service: Cardiovascular;  Laterality: N/A;  . NEPHROLITHOTOMY Left 02/04/2018   Procedure: NEPHROLITHOTOMY PERCUTANEOUS;  Surgeon: Alexis Frock, MD;  Location: [facility];  Service: Urology;  Laterality: Left;  3 HRS  . NEPHROLITHOTOMY Left 02/07/2018   Procedure: SECOND LOOK NEPHROLITHOTOMY PERCUTANEOUS;  Surgeon: Alexis Frock, MD;  Location: [facility];  Service: Urology;  Laterality: Left;  2 HRS   . OVARIAN CYST REMOVAL    . TEE WITHOUT CARDIOVERSION N/A 12/23/2018   Procedure: TRANSESOPHAGEAL ECHOCARDIOGRAM (TEE);  Surgeon: Lelon Perla, MD;  Location: [facility] ENDOSCOPY;  Service: Cardiovascular;  Laterality: N/A;  . UNILATERAL SALPINGECTOMY     Pt unsure of which fallopian tube was removed  . WISDOM TOOTH EXTRACTION       OB History    Gravida  7   Para  3   Term      Preterm  3   AB  2   Living  3     SAB  1   TAB      Ectopic  1   Multiple      Live Births  5           Family History  Problem Relation Age of Onset  . Diabetes Paternal Grandfather   . COPD Paternal Grandfather   . Heart disease Paternal Grandfather   .  Cancer Paternal Grandfather        bone  . Cancer Paternal Grandmother   . Heart disease Paternal Grandmother   . Cancer Father   . Hypertension Father   . Heart attack Father   . Alcohol abuse Father   . Depression Father   . Heart failure Mother   . Diabetes Mother   . Heart disease Mother   . Hyperlipidemia Mother   . Hypertension Mother   . Heart attack Mother   . Peripheral vascular disease Mother   . Depression Mother   . COPD Mother   . Hypertension Sister   . Heart attack Sister   . Stroke Maternal Grandmother   . Colon cancer Neg Hx   . Colon polyps Neg Hx   . Esophageal cancer Neg Hx   . Rectal cancer Neg Hx   . Stomach cancer Neg Hx     Social History   Tobacco Use  . Smoking status: Current Every Day Smoker    Packs/day: 1.00    Years: 37.00    Pack years: 37.00    Types: Cigarettes  . Smokeless tobacco: Never Used  Substance Use Topics  . Alcohol use: No    Alcohol/week: 0.0 standard drinks  . Drug use: No    Home Medications Prior to Admission medications   Medication Sig Start Date End Date Taking? Authorizing Provider  atorvastatin (LIPITOR) 80 MG tablet Take 1 tablet (80 mg total) by mouth daily at 6 PM. 01/16/19   [name], NP  AZO CRANBERRY GUMMIES PO Take 2 tablets by mouth  daily.    [provider]  Blood Glucose Monitoring Suppl (ONETOUCH VERIO) w/Device KIT Use to check blood sugars every morning fasting and 2 hours after largest meal 02/02/18   Danford, Valetta Fuller D, NP  cetirizine (ZYRTEC) 10 MG tablet Take 1 tablet (10 mg total) by mouth daily. 05/25/19 08/23/19  [name], NP  clopidogrel (PLAVIX) 75 MG tablet Take 1 tablet (75 mg total) by mouth daily. 05/25/19 08/23/19  [name], NP  DULoxetine (CYMBALTA) 30 MG capsule Take 1 capsule (30 mg total) by mouth daily. May increase to 2 tablets by mouth daily after 2 weeks if needed 05/25/19 08/23/19  [name], NP  glucose blood [facility] VERIO) test strip Use to check blood sugars every morning fasting and 2 hours after largest meal 02/23/19   [name], NP  indapamide (LOZOL) 2.5 MG tablet Take 2.5 mg by mouth every evening.    [provider]  Insulin Glargine (LANTUS) 100 UNIT/ML Solostar Pen Inject 15 Units into the skin at bedtime. 01/16/19   [name], NP  Insulin Pen Needle (PEN NEEDLES) 31G X 8 MM MISC 1 each by Does not apply route daily. 01/16/19   [name], NP  metFORMIN (GLUCOPHAGE) 500 MG tablet Take 2 tablets (1,000 mg total) by mouth 2 (two) times daily with a meal. 05/25/19 08/23/19  [name], NP  Multiple Vitamins-Minerals (ONE-A-DAY WOMENS 50+ ADVANTAGE) TABS Take 1 tablet by mouth daily.    [provider]  nystatin cream (MYCOSTATIN) Apply 1 application topically 2 (two) times daily. 05/29/19   [name], NP  ondansetron (ZOFRAN) 4 MG tablet Take 1 tablet (4 mg total) by mouth every 8 (eight) hours as needed for nausea or vomiting. 05/26/19   [name], NP  ONE TOUCH LANCETS MISC Use to check blood sugars every morning fasting and 2 hours  after largest meal 02/02/18   Danford, Valetta Fuller D, NP  topiramate (TOPAMAX) 100 MG tablet Take 1 tablet (100 mg total) by mouth 2 (two) times daily. 03/21/19 03/20/20  Garvin Fila, MD     Allergies    Sulfa antibiotics, Latex, and Tape  Review of Systems   Review of Systems  Gastrointestinal: Positive for diarrhea and nausea.  Neurological: Positive for weakness.  All other systems reviewed and are negative.   Physical Exam Updated Vital Signs BP (!) 116/49 (BP Location: Right Arm)   Pulse (!) 103   Temp 98.3 F (36.8 C) (Oral)   Resp 20   Ht _0  (1.6 m)   Wt 90.7 kg   SpO2 96%   BMI 35.43 kg/m   Physical Exam Vitals and nursing note reviewed.  Constitutional:      Appearance: She is well-developed.  HENT:     Head: Normocephalic and atraumatic.     Mouth/Throat:     Lips: Pink.     Mouth: Mucous membranes are dry.     Comments: Thrush noted to the tongue, no discrete lesions or ulceration noted, no edema, handling secretions well Mucous membranes are dry Eyes:     Conjunctiva/sclera: Conjunctivae normal.     Pupils: Pupils are equal, round, and reactive to light.  Cardiovascular:     Rate and Rhythm: Normal rate and regular rhythm.     Heart sounds: Normal heart sounds.  Pulmonary:     Effort: Pulmonary effort is normal.     Breath sounds: Normal breath sounds.  Abdominal:     General: Bowel sounds are normal.     Palpations: Abdomen is soft.     Tenderness: There is no abdominal tenderness. There is no guarding or rebound.     Comments: Obese abdomen but soft and non-tender  Musculoskeletal:        General: Normal range of motion.     Cervical back: Normal range of motion.  Skin:    General: Skin is warm and dry.  Neurological:     Mental Status: She is alert and oriented to person, place, and time.     ED Results / Procedures / Treatments   Labs (all labs ordered are listed, but only abnormal results are displayed) Labs Reviewed  COMPREHENSIVE METABOLIC PANEL - Abnormal; Notable for the following components:      Result Value   Potassium 5.2 (*)    CO2 15 (*)    Glucose, Bld 125 (*)    BUN 116 (*)    Creatinine, Ser 8.13  (*)    Total Protein 8.3 (*)    GFR calc non Af Amer 5 (*)    GFR calc Af Amer 6 (*)    Anion gap 21 (*)    All other components within normal limits  CBC - Abnormal; Notable for the following components:   RBC 5.26 (*)    Hemoglobin 16.3 (*)    HCT 50.8 (*)    All other components within normal limits  URINALYSIS, ROUTINE W REFLEX MICROSCOPIC - Abnormal; Notable for the following components:   APPearance CLOUDY (*)    Hgb urine dipstick SMALL (*)    Protein, ur 100 (*)    Leukocytes,Ua MODERATE (*)    WBC, UA >50 (*)    Bacteria, UA MANY (*)    All other components within normal limits  LACTIC ACID, PLASMA - Abnormal; Notable for the following components:   Lactic Acid, Venous 2.5 (*)  All other components within normal limits  CULTURE, BLOOD (ROUTINE X 2)  CULTURE, BLOOD (ROUTINE X 2)  URINE CULTURE  SARS CORONAVIRUS 2 (TAT 6-24 HRS)  LIPASE, BLOOD  MAGNESIUM  PHOSPHORUS  CBC  BASIC METABOLIC PANEL  LACTIC ACID, PLASMA  LACTIC ACID, PLASMA    EKG EKG Interpretation  Date/Time:  Tuesday June 20 2019 21:01:55 EDT Ventricular Rate:  102 PR Interval:  144 QRS Duration: 94 QT Interval:  340 QTC Calculation: 443 R Axis:   149 Text Interpretation: Sinus tachycardia Right axis deviation Cannot rule out Anterior infarct , age undetermined Abnormal ECG No significant change since last tracing Confirmed by Merrily Pew [phone]) on 06/20/2019 11:32:51 PM   Radiology No results found.  Procedures Procedures (including critical care time)  CRITICAL CARE Performed by: Larene Pickett   Total critical care time: 45 minutes  Critical care time was exclusive of separately billable procedures and treating other patients.  Critical care was necessary to treat or prevent imminent or life-threatening deterioration.  Critical care was time spent personally by me on the following activities: development of treatment plan with patient and/or surrogate as well as nursing,  discussions with consultants, evaluation of patient's response to treatment, examination of patient, obtaining history from patient or surrogate, ordering and performing treatments and interventions, ordering and review of laboratory studies, ordering and review of radiographic studies, pulse oximetry and re-evaluation of patient's condition.   Medications Ordered in ED Medications  cefTRIAXone (ROCEPHIN) 1 g in sodium chloride 0.9 % 100 mL IVPB (has no administration in time range)  metoCLOPramide (REGLAN) injection 10 mg (has no administration in time range)  sodium chloride 0.9 % bolus 1,000 mL (has no administration in time range)  nystatin (MYCOSTATIN) 100000 UNIT/ML suspension 500,000 Units (has no administration in time range)  atorvastatin (LIPITOR) tablet 80 mg (has no administration in time range)  DULoxetine (CYMBALTA) DR capsule 30 mg (has no administration in time range)  clopidogrel (PLAVIX) tablet 75 mg (has no administration in time range)  topiramate (TOPAMAX) tablet 100 mg (has no administration in time range)  heparin injection 5,000 Units (has no administration in time range)  insulin aspart (novoLOG) injection 0-9 Units (has no administration in time range)  sodium chloride flush (NS) 0.9 % injection 3 mL (3 mLs Intravenous Given 06/21/19 0256)  sodium chloride 0.9 % bolus 1,000 mL (1,000 mLs Intravenous New Bag/Given 06/21/19 0255)  sodium chloride 0.9 % bolus 1,000 mL (1,000 mLs Intravenous New Bag/Given 06/21/19 0255)  cefTRIAXone (ROCEPHIN) 1 g in sodium chloride 0.9 % 100 mL IVPB (1 g Intravenous New Bag/Given 06/21/19 0254)  fluconazole (DIFLUCAN) tablet 150 mg (150 mg Oral Given 06/21/19 0254)  metoCLOPramide (REGLAN) injection 5 mg (5 mg Intravenous Given 06/21/19 0255)    ED Course  I have reviewed the triage vital signs and the nursing notes.  Pertinent labs & imaging results that were available during my care of the patient were reviewed by me and considered in my  medical decision making (see chart for details).    MDM Rules/Calculators/A&P  1:12 AM Patient seen and evaluated-- unfortunately she did have prolonged wait time of 5+ hours.  She is afebrile and nontoxic in appearance.  Her abdomen is overall soft and benign.  Her initial blood pressure reading when hooked up to monitor was 78/57.  Patient is mentating appropriately denies any current dizziness or feeling like she is going to pass out.  She does report she was started on indapamide to  help with recurrence of her kidney stones which has unfortunately lowered her blood pressure.  States blood pressure readings are often very low like this, sometimes not even picking up on home machine.  She has not had any noted fever.  She is not tachycardic.  She does appear to have urinary infection but normal white blood cell count.  She does have new renal failure today with a creatinine of 8.13.  Given her recent symptoms of diarrhea and nausea precluding good oral intake, this seems prerenal.  Will aggressively hydrate and obtain lactate, urine cultures, and set of blood cultures.  She will require admission.  Patient is agreeable.  At this point, I do not think there is any emergent role for nephrology.  If not responding to IV fluids, can consider consultation at that time.  Discussed with hospitalist, Dr. Marice Potter-- will admit for ongoing care.  Final Clinical Impression(s) / ED Diagnoses Final diagnoses:  Acute renal failure, unspecified acute renal failure type ([place])  Dehydration  Acute cystitis with hematuria    Rx / DC Orders ED Discharge Orders    None       Larene Pickett, PA-C 06/21/19 [place], April, MD 06/21/19 [phone]

## 2019-06-21 NOTE — H&P (Signed)
[facility] History and Physical  DANELLA PHILSON HEN:[id number] DOB: 01/17/1966 DOA: 06/20/2019  Referring EDP: Baird Cancer, PA PCP: [name], NP   Chief Complaint: Nausea/Generalized weakness  HPI: Pamela Carney is a 54 y.o. female with PMH of T2DM, GERD, CKD, obesity, Bipolar, recurrent UTI's, migraines and stroke presented to ED with nausea/vomiting and found to have ARF.  Patient reports that she started having watery diarrhea about two weeks ago that lasted for 4-5 days straight; since resolved. Reports nausea during that time that has persisted. She has been unable to tolerate PO for about the last 2 weeks and only able to take occasional sips. She has tried Zofran at home without much improvement. This morning she noticed that her tongue felt differently and now has oral thrush which has further diminished her ability to tolerate PO. Not eating has helped her to not feel quite as nauseous. Reports occasional migraine headaches, none currently. Also reports frequent UTI's and currently has dysuria. No known sick contacts. Reports feeling overall weak. Denies headache, dizziness, fever, chills, cough, SOB, chest pain, abdominal pain, vomiting, diarrhea, constipation, hematuria, hematochezia, melena, difficulty moving arms/legs, speech difficulty, confusion or any other complaints.  In the ED: Mild tachycardia that resolved otherwise vitals stable. Labs remarkable for: Lipase WNL. K 5.2, Glucose 125, Cr 8.13, BUN 116, Anion gap 21, CO2 15, WBC 10.1, Hgb 16.3, Lactate 2.5, UA with moderate leuks and > 50 WBC's. Patient was given Rocephin, Diflucan, Reglan, IVF and called for admission for ARF and inability to tolerate PO.  Review of Systems:  All other systems negative unless noted above in HPI.   Past Medical History:  Diagnosis Date  . Abnormal Pap smear   . AKI (acute kidney injury) ([place]) 12/28/2017  . Anxiety   . Arthritis    bil knees, neck  . Bipolar 1 disorder ([place])    . Cataract    Mild  . Cholelithiasis 12/23/2017   noted on CT renal, pt unaware  . Chronic kidney disease (CKD), stage III (moderate)   . Cystitis   . Depression   . Diabetes mellitus without complication ([place])    type 2  . Genital warts    Hx of genital  . GERD (gastroesophageal reflux disease)   . Heart murmur    childhood  . Hematuria   . History of blood transfusion   . History of ectopic pregnancy   . History of gestational diabetes   . History of kidney stones   . History of sepsis    after ectopic pregnancy  . Hyperlipidemia   . Hypertension   . Idiopathic peripheral neuropathy    both feet  . Leg ulcer, left ([place])   . Low back pain   . Migraines   . Obesity   . Oral candidiasis   . Pneumonia   . PONV (postoperative nausea and vomiting)    prolonged sedation  . Recurrent UTI   . 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 Sigmoid diverticulosis 12/23/2017   noted on CT renal, pt unaware  . Sleep apnea    uses cpap  . Stroke [facility])    Cryptogenic  . Ulcer of foot ([place])    Left  . Weakness    Past Surgical History:  Procedure Laterality Date  . BUBBLE STUDY  12/23/2018   Procedure: BUBBLE STUDY;  Surgeon: Lelon Perla, MD;  Location: [facility];  Service: Cardiovascular;;  . CERVICAL CONE BIOPSY    . CESAREAN SECTION     x3  . CYSTOSCOPY W/ URETERAL STENT PLACEMENT Right 12/15/2018   Procedure: Cystoscopy, right retrograde ureteropyelogram, fluoroscopic interpretation, right double-J stent placement (24 cm x 6 [place]);  Surgeon: Franchot Gallo, MD;  Location: [facility];  Service: Urology;  Laterality: Right;  . CYSTOSCOPY WITH RETROGRADE PYELOGRAM, URETEROSCOPY AND STENT PLACEMENT Right 01/27/2019   Procedure: CYSTOSCOPY WITH RETROGRADE PYELOGRAM, URETEROSCOPY AND STENT PLACEMENT; BASKET STONE RETRIEVAL;  Surgeon: Alexis Frock, MD;  Location: [facility];  Service:  Urology;  Laterality: Right;  75 MINS  . CYSTOSCOPY/URETEROSCOPY/HOLMIUM LASER/STENT PLACEMENT Right 02/04/2018   Procedure: CYSTOSCOPY/URETEROSCOPY/RETROGRADE PYELOGRAM/HOLMIUM LASER/STENT PLACEMENT;  Surgeon: Alexis Frock, MD;  Location: [facility];  Service: Urology;  Laterality: Right;  . CYSTOSCOPY/URETEROSCOPY/HOLMIUM LASER/STENT PLACEMENT Right 02/07/2018   Procedure: CYSTOSCOPY LEFT STENT EXCHANGE;  Surgeon: Alexis Frock, MD;  Location: [facility];  Service: Urology;  Laterality: Right;  . DILATION AND CURETTAGE OF UTERUS    . ECTOPIC PREGNANCY SURGERY    . IR NEPHROSTOMY PLACEMENT LEFT  12/25/2017  . LOOP RECORDER INSERTION N/A 12/23/2018   Procedure: LOOP RECORDER INSERTION;  Surgeon: Constance Haw, MD;  Location: [facility];  Service: Cardiovascular;  Laterality: N/A;  . NEPHROLITHOTOMY Left 02/04/2018   Procedure: NEPHROLITHOTOMY PERCUTANEOUS;  Surgeon: Alexis Frock, MD;  Location: [facility];  Service: Urology;  Laterality: Left;  3 HRS  . NEPHROLITHOTOMY Left 02/07/2018   Procedure: SECOND LOOK NEPHROLITHOTOMY PERCUTANEOUS;  Surgeon: Alexis Frock, MD;  Location: [facility];  Service: Urology;  Laterality: Left;  2 HRS  . OVARIAN CYST REMOVAL    . TEE WITHOUT CARDIOVERSION N/A 12/23/2018   Procedure: TRANSESOPHAGEAL ECHOCARDIOGRAM (TEE);  Surgeon: Lelon Perla, MD;  Location: [facility] ENDOSCOPY;  Service: Cardiovascular;  Laterality: N/A;  . UNILATERAL SALPINGECTOMY     Pt unsure of which fallopian tube was removed  . WISDOM TOOTH EXTRACTION     Social History:  reports that she has been smoking cigarettes. She has a 37.00 pack-year smoking history. She has never used smokeless tobacco. She reports that she does not drink alcohol or use drugs.  Allergies  Allergen Reactions  . Sulfa Antibiotics Hives and Swelling    Swelling site not recalled  . Latex Rash  . Tape Rash    Not tolerated well    Family History  Problem Relation Age of Onset  . Diabetes Paternal  Grandfather   . COPD Paternal Grandfather   . Heart disease Paternal Grandfather   . Cancer Paternal Grandfather        bone  . Cancer Paternal Grandmother   . Heart disease Paternal Grandmother   . Cancer Father   . Hypertension Father   . Heart attack Father   . Alcohol abuse Father   . Depression Father   . Heart failure Mother   . Diabetes Mother   . Heart disease Mother   . Hyperlipidemia Mother   . Hypertension Mother   . Heart attack Mother   . Peripheral vascular disease Mother   . Depression Mother   . COPD Mother   . Hypertension Sister   . Heart attack Sister   . Stroke Maternal Grandmother   . Colon cancer Neg Hx   . Colon polyps Neg Hx   . Esophageal cancer Neg Hx   . Rectal cancer Neg Hx   . Stomach cancer Neg Hx      Prior to Admission medications  Medication Sig Start Date End Date Taking? Authorizing Provider  atorvastatin (LIPITOR) 80 MG tablet Take 1 tablet (80 mg total) by mouth daily at 6 PM. 01/16/19  Yes [name], NP  AZO CRANBERRY GUMMIES PO Take 2 tablets by mouth daily.   Yes [provider]  cetirizine (ZYRTEC) 10 MG tablet Take 1 tablet (10 mg total) by mouth daily. 05/25/19 08/23/19 Yes [name], NP  clopidogrel (PLAVIX) 75 MG tablet Take 1 tablet (75 mg total) by mouth daily. 05/25/19 08/23/19 Yes [name], NP  DULoxetine (CYMBALTA) 30 MG capsule Take 1 capsule (30 mg total) by mouth daily. May increase to 2 tablets by mouth daily after 2 weeks if needed Patient taking differently: Take 30 mg by mouth 2 (two) times daily.  05/25/19 08/23/19 Yes [name], NP  indapamide (LOZOL) 2.5 MG tablet Take 2.5 mg by mouth every evening.   Yes [provider]  metFORMIN (GLUCOPHAGE) 500 MG tablet Take 2 tablets (1,000 mg total) by mouth 2 (two) times daily with a meal. 05/25/19 08/23/19 Yes [name], NP  Multiple Vitamins-Minerals (ONE-A-DAY WOMENS 50+ ADVANTAGE) TABS Take 1 tablet by mouth daily.   Yes [provider]  nystatin cream (MYCOSTATIN) Apply 1 application topically 2 (two) times daily. Patient taking differently: Apply 1 application topically 2 (two) times daily as needed for dry skin.  05/29/19  Yes [name], NP  ondansetron (ZOFRAN) 4 MG tablet Take 1 tablet (4 mg total) by mouth every 8 (eight) hours as needed for nausea or vomiting. 05/26/19  Yes [name], NP  topiramate (TOPAMAX) 100 MG tablet Take 1 tablet (100 mg total) by mouth 2 (two) times daily. 03/21/19 03/20/20 Yes Garvin Fila, MD  Blood Glucose Monitoring Suppl (ONETOUCH VERIO) w/Device KIT Use to check blood sugars every morning fasting and 2 hours after largest meal 02/02/18   Danford, Katy D, NP  glucose blood (ONETOUCH VERIO) test strip Use to check blood sugars every morning fasting and 2 hours after largest meal 02/23/19   [name], NP  Insulin Glargine (LANTUS) 100 UNIT/ML Solostar Pen Inject 15 Units into the skin at bedtime. 01/16/19   [name], NP  Insulin Pen Needle (PEN NEEDLES) 31G X 8 MM MISC 1 each by Does not apply route daily. 01/16/19   [name], NP  ONE TOUCH LANCETS MISC Use to check blood sugars every morning fasting and 2 hours after largest meal 02/02/18   Mina Marble D, NP   Physical Exam: Vitals:   06/20/19 2043 06/20/19 2059  BP: (!) 116/49   Pulse: (!) 103   Resp: 20   Temp: 98.3 F (36.8 C)   TempSrc: Oral   SpO2: 96%   Weight:  90.7 kg  Height:  5' 3"  (1.6 m)    Wt Readings from Last 3 Encounters:  06/20/19 90.7 kg  05/29/19 98.4 kg  05/25/19 101.4 kg    . General:  Appears calm and comfortable. AAOx4. Obese. . Eyes: EOMI, normal lids, irises & conjunctiva . ENT: grossly normal hearing, oral thrush noted currently with pink lesions covering tongue as patient reports eating a pink candy  . Neck: normal ROM . Cardiovascular: RRR, no m/r/g. No LE edema. Marland Kitchen Respiratory: CTA bilaterally, no w/r/r. Normal respiratory effort. . Abdomen: soft,  mild generalized tenderness more located over left quadrants described as "soreness", no rebound or guarding . Skin: no rash or induration seen on limited  exam . Musculoskeletal: grossly normal tone BUE/BLE . Psychiatric: grossly normal mood and affect, speech fluent and appropriate . Neurologic: grossly non-focal.          Labs on Admission:  Basic Metabolic Panel: Recent Labs  Lab 06/20/19 2111  NA 138  K 5.2*  CL 102  CO2 15*  GLUCOSE 125*  BUN 116*  CREATININE 8.13*  CALCIUM 10.2   Liver Function Tests: Recent Labs  Lab 06/20/19 2111  AST 17  ALT 20  ALKPHOS 126  BILITOT 0.3  PROT 8.3*  ALBUMIN 4.3   Recent Labs  Lab 06/20/19 2111  LIPASE 44   No results for input(s): AMMONIA in the last 168 hours. CBC: Recent Labs  Lab 06/20/19 2111  WBC 10.1  HGB 16.3*  HCT 50.8*  MCV 96.6  PLT 383   Cardiac Enzymes: No results for input(s): CKTOTAL, CKMB, CKMBINDEX, TROPONINI in the last 168 hours.  BNP (last 3 results) No results for input(s): BNP in the last 8760 hours.  ProBNP (last 3 results) No results for input(s): PROBNP in the last 8760 hours.  CBG: No results for input(s): GLUCAP in the last 168 hours.  Radiological Exams on Admission: No results found.  EKG: Independently reviewed. HR 100. Sinus tachycardia. QTc 443. No STEMI.   Assessment/Plan Principal Problem:   Acute renal failure (ARF) (HCC) Active Problems:   Generalized weakness   Oral candidiasis   Type 2 diabetes mellitus with other specified complication (HCC)   Bipolar 1 disorder (HCC)   Obesity   Acute renal failure superimposed on stage 3 chronic kidney disease (HCC)   UTI (urinary tract infection)   Nausea   Dehydration   HLD (hyperlipidemia)  54 y.o. female with PMH of T2DM, CKD, GERD, obesity, Bipolar, recurrent UTI's, migraines and stroke presented to ED with nausea/vomiting and found to have ARF.  ARF with baseline CKD Stage 3a - Cr 8.13 on admission; baseline ~  1.2-1.5 - likely due to hypovolemia in setting of diarrhea and inability to tolerate PO - follow BMP and electrolytes - s/p 2 L IVF in ED and another bolus ordered on admission - Urine lytes and possibly Nephro consult if not improving over next 24-48 hours - Avoid nephrotoxic agents - Hold PTA Lozol  - Mild hyperkalemia; no peaked T waves on EKG  Nausea Dehydration - Unclear etiology; nausea appears to be a frequent complaint of patient - Hgb hemo-concentrated  - abdominal exam benign  - possibly gastroparesis? - Reglan PRN - IVF - Clear liquid diet  - Mag and Phos ordered on admission - Lactate elevated; follow until < 2.0  UTI - UA with moderate leuks and > 50 WBC's; patient reporting dysuria - reports she has been on suppression medications that did not help due to hx of recurrent UTI's - follow Ur cx - cont Rocephin  T2DM - A1c 7.4 in Feb 2021 - Home regimen: Metformin, Lantus 15 u qhs - Held home meds as not eating - Sliding scale  Oral Candidiasis - given Diflucan in ED - Nystatin ordered on admission  Hx CVA - cont Plavix  Bipolar/Chronic Pain - Cont Cymbalta   HLD - cont statin   Code Status: Full  DVT Prophylaxis: Heparin Family Communication: None Disposition Plan: Admit to inpatient. Patient is at high risk for further decompensation due to age and co-morbidities. Patient requiring IV medications, unable to tolerate PO and currently with acute renal failure that may require specialty consultation if not resolving.  Time spent: 55 minutes   Chauncey Mann, MD [facility] Pager [phone]

## 2019-06-21 NOTE — ED Notes (Signed)
Walked patient to the bathroom patient did well 

## 2019-06-22 DIAGNOSIS — R531 Weakness: Secondary | ICD-10-CM

## 2019-06-22 LAB — CBC
HCT: 39.2 % (ref 36.0–46.0)
Hemoglobin: 12.7 g/dL (ref 12.0–15.0)
MCH: 30.9 pg (ref 26.0–34.0)
MCHC: 32.4 g/dL (ref 30.0–36.0)
MCV: 95.4 fL (ref 80.0–100.0)
Platelets: 273 10*3/uL (ref 150–400)
RBC: 4.11 MIL/uL (ref 3.87–5.11)
RDW: 14 % (ref 11.5–15.5)
WBC: 8.9 10*3/uL (ref 4.0–10.5)
nRBC: 0 % (ref 0.0–0.2)

## 2019-06-22 LAB — MAGNESIUM: Magnesium: 1.5 mg/dL — ABNORMAL LOW (ref 1.7–2.4)

## 2019-06-22 LAB — RENAL FUNCTION PANEL
Albumin: 3.3 g/dL — ABNORMAL LOW (ref 3.5–5.0)
Anion gap: 16 — ABNORMAL HIGH (ref 5–15)
BUN: 80 mg/dL — ABNORMAL HIGH (ref 6–20)
CO2: 16 mmol/L — ABNORMAL LOW (ref 22–32)
Calcium: 9.2 mg/dL (ref 8.9–10.3)
Chloride: 110 mmol/L (ref 98–111)
Creatinine, Ser: 3.54 mg/dL — ABNORMAL HIGH (ref 0.44–1.00)
GFR calc Af Amer: 16 mL/min — ABNORMAL LOW (ref >60)
GFR calc non Af Amer: 14 mL/min — ABNORMAL LOW (ref >60)
Glucose, Bld: 107 mg/dL — ABNORMAL HIGH (ref 70–99)
Phosphorus: 3.5 mg/dL (ref 2.5–4.6)
Potassium: 3.5 mmol/L (ref 3.5–5.1)
Sodium: 142 mmol/L (ref 135–145)

## 2019-06-22 LAB — GLUCOSE, CAPILLARY
Glucose-Capillary: 107 mg/dL — ABNORMAL HIGH (ref 70–99)
Glucose-Capillary: 130 mg/dL — ABNORMAL HIGH (ref 70–99)
Glucose-Capillary: 135 mg/dL — ABNORMAL HIGH (ref 70–99)
Glucose-Capillary: 174 mg/dL — ABNORMAL HIGH (ref 70–99)

## 2019-06-22 MED ORDER — POTASSIUM CHLORIDE CRYS ER 20 MEQ PO TBCR
40.0000 meq | EXTENDED_RELEASE_TABLET | Freq: Once | ORAL | Status: AC
  Administered 2019-06-22: 09:00:00 40 meq via ORAL
  Filled 2019-06-22: qty 2

## 2019-06-22 MED ORDER — SIMETHICONE 80 MG PO CHEW
80.0000 mg | CHEWABLE_TABLET | Freq: Four times a day (QID) | ORAL | Status: DC | PRN
  Administered 2019-06-22: 22:00:00 80 mg via ORAL
  Filled 2019-06-22: qty 1

## 2019-06-22 MED ORDER — MUSCLE RUB 10-15 % EX CREA
TOPICAL_CREAM | Freq: Two times a day (BID) | CUTANEOUS | Status: DC | PRN
  Filled 2019-06-22: qty 85

## 2019-06-22 MED ORDER — SODIUM BICARBONATE 650 MG PO TABS
650.0000 mg | ORAL_TABLET | Freq: Three times a day (TID) | ORAL | Status: DC
  Administered 2019-06-22 – 2019-06-24 (×7): 650 mg via ORAL
  Filled 2019-06-22 (×7): qty 1

## 2019-06-22 MED ORDER — TROLAMINE SALICYLATE 10 % EX CREA
TOPICAL_CREAM | Freq: Two times a day (BID) | CUTANEOUS | Status: DC | PRN
  Filled 2019-06-22: qty 85

## 2019-06-22 MED ORDER — MAGNESIUM SULFATE 2 GM/50ML IV SOLN
2.0000 g | Freq: Once | INTRAVENOUS | Status: AC
  Administered 2019-06-22: 2 g via INTRAVENOUS
  Filled 2019-06-22: qty 50

## 2019-06-22 NOTE — Evaluation (Signed)
Physical Therapy Evaluation Patient Details Name: Pamela Carney MRN: [id number] DOB: May 04, 1965 Today's Date: 06/22/2019   History of Present Illness  Pamela Carney is a 54 y.o. female with PMH of T2DM, GERD, CKD, obesity, Bipolar, recurrent UTI's, migraines and stroke. Presented to ED 3/31 with nausea/vomiting and found to have ARF.    Clinical Impression  Patient presented sitting EOB, awake, and willing to participate in therapy. PTA, pt was independent in all ADL's and performed mobility with no AD. Pt lives with her 67 y.o son in a one level home with 3 steps to enter. At the time of evaluation, pt was able to stand from EOB with min guard for safety and VC for proper sequencing with RW. Pt ambulated ~144ft with RW and min guard, pt with poor understanding of activity tolerance, requiring seated rest break midway. No LOB or physical assist needed throughout. Hopeful that pt will progress with strength and endurance with improved nutrition tolerance. Recommed d/c to OP PT to address strength and endurance deficits. Pt would continue to benefit from skilled physical therapy services at this time while admitted and after d/c to address the below listed limitations in order to improve overall safety and independence with functional mobility.     Follow Up Recommendations Outpatient PT    Equipment Recommendations  (Pt has recommended DME at home)    Recommendations for Other Services       Precautions / Restrictions Precautions Precautions: Fall Restrictions Weight Bearing Restrictions: No      Mobility  Bed Mobility Overal bed mobility: Modified Independent             General bed mobility comments: increased time and effort, but no assist required  Transfers Overall transfer level: Needs assistance Equipment used: (IV pole ) Transfers: Sit to/from Stand Sit to Stand: Min guard         General transfer comment: min guard for safety and  balance  Ambulation/Gait Ambulation/Gait assistance: Min guard Gait Distance (Feet): 100 Feet Assistive device: Rolling walker (2 wheeled) Gait Pattern/deviations: Step-through pattern;Decreased stride length Gait velocity: decreased   General Gait Details: Pt with poor understanding of acitivty tolerance. Required 1 seated rest break midway. Min Guard for safety, no physical assist  Stairs            Wheelchair Mobility    Modified Rankin (Stroke Patients Only)       Balance Overall balance assessment: Needs assistance Sitting-balance support: No upper extremity supported;Feet supported Sitting balance-Leahy Scale: Good     Standing balance support: During functional activity;No upper extremity supported;Bilateral upper extremity supported Standing balance-Leahy Scale: Poor Standing balance comment: relaint on BUE support dynamically, statically standing during grooming without UE support                             Pertinent Vitals/Pain Pain Assessment: Faces Faces Pain Scale: Hurts a little bit Pain Location: B feet  Pain Descriptors / Indicators: Sore Pain Intervention(s): Monitored during session;Repositioned;Limited activity within patient's tolerance    Home Living Family/patient expects to be discharged to:: Private residence Living Arrangements: Children(18 y/o son) Available Help at Discharge: Family;Available 24 hours/day(son in online school) Type of Home: House Home Access: Stairs to enter Entrance Stairs-Rails: None Entrance Stairs-Number of Steps: 3 Home Layout: One level Home Equipment: Cane - single point;Walker - 2 wheels;Shower seat      Prior Function Level of Independence: Independent  Comments: independent with mobility, ADLs and iADLs; no driving      Hand Dominance   Dominant Hand: Right    Extremity/Trunk Assessment   Upper Extremity Assessment Upper Extremity Assessment: Generalized weakness     Lower Extremity Assessment Lower Extremity Assessment: Defer to PT evaluation RLE Deficits / Details: Strength: WNL. ROM: WNL. States Bil dorsal-lateral metatarsals feel bruised. No apparant deformities. RLE Sensation: WNL LLE Deficits / Details: Strength: WNL. ROM: WNL. States Bil dorsal-lateral metatarsals feel bruised. No apparant deformities. LLE Sensation: WNL       Communication   Communication: No difficulties  Cognition Arousal/Alertness: Awake/alert Behavior During Therapy: Flat affect Overall Cognitive Status: No family/caregiver present to determine baseline cognitive functioning Area of Impairment: Problem solving;Awareness                           Awareness: Emergent Problem Solving: Slow processing;Requires verbal cues General Comments: some slow processing and decreased problem solving       General Comments General comments (skin integrity, edema, etc.): pt placing B hands on IV pole, suggested walker but pt declined; believe she will benefit from RW     Exercises     Assessment/Plan    PT Assessment Patient needs continued PT services  PT Problem List Decreased strength;Decreased activity tolerance;Decreased balance;Decreased mobility;Decreased coordination;Decreased safety awareness       PT Treatment Interventions DME instruction;Gait training;Stair training;Functional mobility training;Therapeutic activities;Therapeutic exercise;Balance training    PT Goals (Current goals can be found in the Care Plan section)  Acute Rehab PT Goals Patient Stated Goal: home and feel better PT Goal Formulation: With patient Time For Goal Achievement: 07/06/19 Potential to Achieve Goals: Good    Frequency Min 3X/week   Barriers to discharge        Co-evaluation               AM-PAC PT "6 Clicks" Mobility  Outcome Measure Help needed turning from your back to your side while in a flat bed without using bedrails?: None Help needed moving  from lying on your back to sitting on the side of a flat bed without using bedrails?: None Help needed moving to and from a bed to a chair (including a wheelchair)?: A Little Help needed standing up from a chair using your arms (e.g., wheelchair or bedside chair)?: None Help needed to walk in hospital room?: A Little Help needed climbing 3-5 steps with a railing? : A Little 6 Click Score: 21    End of Session Equipment Utilized During Treatment: Gait belt Activity Tolerance: Patient tolerated treatment well Patient left: in bed;with nursing/sitter in room   PT Visit Diagnosis: Unsteadiness on feet (R26.81);Muscle weakness (generalized) (M62.81)    Time: [id number] PT Time Calculation (min) (ACUTE ONLY): 17 min   Charges:   PT Evaluation $PT Eval Moderate Complexity: 1 Mod        [place], SPT Acute Rehab  [id number]  Torren Maffeo 06/22/2019, 2:03 PM

## 2019-06-22 NOTE — Evaluation (Signed)
Occupational Therapy Evaluation Patient Details Name: Pamela Carney MRN: [id number] DOB: 1966-02-27 Today's Date: 06/22/2019    History of Present Illness Pamela Carney is a 54 y.o. female with PMH of T2DM, GERD, CKD, obesity, Bipolar, recurrent UTI's, migraines and stroke. Presented to ED 3/31 with nausea/vomiting and found to have ARF.   Clinical Impression   PTA patient independent, but not driving.  She was admitted for above and is limited by problem list below, including generalized weakness and decreased activity tolerance.  She currently requires min guard for transfers and in room mobility using IV pole to steady (with B UES due to patient preference, but declined use of RW), LB ADLs with min guard assist.  She will benefit from continued OT services acutely while admitted to optimize independence and safety with ADLs, mobility and IADLs but anticipate no further needs after dc home.     Follow Up Recommendations  No OT follow up;Supervision - Intermittent    Equipment Recommendations  3 in 1 bedside commode(as shower chair )    Recommendations for Other Services       Precautions / Restrictions Precautions Precautions: Fall Restrictions Weight Bearing Restrictions: No      Mobility Bed Mobility Overal bed mobility: Modified Independent             General bed mobility comments: increased time and effort, but no assist required  Transfers Overall transfer level: Needs assistance Equipment used: (IV pole ) Transfers: Sit to/from Stand Sit to Stand: Min guard         General transfer comment: min guard for safety and balance    Balance Overall balance assessment: Needs assistance Sitting-balance support: No upper extremity supported;Feet supported Sitting balance-Leahy Scale: Good     Standing balance support: During functional activity;No upper extremity supported;Bilateral upper extremity supported Standing balance-Leahy Scale: Poor Standing  balance comment: relaint on BUE support dynamically, statically standing during grooming without UE support                           ADL either performed or assessed with clinical judgement   ADL Overall ADL's : Needs assistance/impaired     Grooming: Min guard;Standing   Upper Body Bathing: Set up;Sitting   Lower Body Bathing: Min guard;Sit to/from stand   Upper Body Dressing : Set up;Sitting   Lower Body Dressing: Min guard;Sit to/from stand   Toilet Transfer: Min guard;Ambulation Toilet Transfer Details (indicate cue type and reason): using IV pole  Toileting- Clothing Manipulation and Hygiene: Min guard;Sit to/from stand       Functional mobility during ADLs: Min guard(IV pole ) General ADL Comments: pt limited by generalized weakness and decreased activity tolerance      Vision   Vision Assessment?: No apparent visual deficits     Perception     Praxis      Pertinent Vitals/Pain Pain Assessment: Faces Faces Pain Scale: Hurts a little bit Pain Location: B feet  Pain Descriptors / Indicators: Sore Pain Intervention(s): Monitored during session;Repositioned;Limited activity within patient's tolerance     Hand Dominance Right   Extremity/Trunk Assessment Upper Extremity Assessment Upper Extremity Assessment: Generalized weakness   Lower Extremity Assessment Lower Extremity Assessment: Defer to PT evaluation RLE Deficits / Details: Strength: WNL. ROM: WNL. States Bil dorsal-lateral metatarsals feel bruised. No apparant deformities. RLE Sensation: WNL LLE Deficits / Details: Strength: WNL. ROM: WNL. States Bil dorsal-lateral metatarsals feel bruised. No apparant deformities. LLE Sensation: WNL  Communication Communication Communication: No difficulties   Cognition Arousal/Alertness: Awake/alert Behavior During Therapy: Flat affect Overall Cognitive Status: No family/caregiver present to determine baseline cognitive functioning Area of  Impairment: Problem solving;Awareness                           Awareness: Emergent Problem Solving: Slow processing;Requires verbal cues General Comments: some slow processing and decreased problem solving    General Comments  pt placing B hands on IV pole, suggested walker but pt declined; believe she will benefit from RW     Exercises     Shoulder Instructions      Home Living Family/patient expects to be discharged to:: Private residence Living Arrangements: Children(18 y/o son) Available Help at Discharge: Family;Available 24 hours/day(son in online school) Type of Home: House Home Access: Stairs to enter [employer] of Steps: 3 Entrance Stairs-Rails: None Home Layout: One level     Bathroom Shower/Tub: Teacher, early years/pre: Standard     Home Equipment: Cane - single point;Walker - 2 wheels;Shower seat          Prior Functioning/Environment Level of Independence: Independent        Comments: independent with mobility, ADLs and iADLs; no driving         OT Problem List: Decreased strength;Decreased activity tolerance;Impaired balance (sitting and/or standing);Decreased safety awareness;Decreased knowledge of use of DME or AE;Decreased knowledge of precautions;Obesity      OT Treatment/Interventions: Self-care/ADL training;Energy conservation;DME and/or AE instruction;Therapeutic activities;Patient/family education;Balance training    OT Goals(Current goals can be found in the care plan section) Acute Rehab OT Goals Patient Stated Goal: home and feel better OT Goal Formulation: With patient Time For Goal Achievement: 07/06/19 Potential to Achieve Goals: Good  OT Frequency: Min 2X/week   Barriers to D/C:            Co-evaluation              AM-PAC OT "6 Clicks" Daily Activity     Outcome Measure Help from another person eating meals?: None Help from another person taking care of personal grooming?: A  Little Help from another person toileting, which includes using toliet, bedpan, or urinal?: A Little Help from another person bathing (including washing, rinsing, drying)?: A Little Help from another person to put on and taking off regular upper body clothing?: A Little Help from another person to put on and taking off regular lower body clothing?: A Little 6 Click Score: 19   End of Session    Activity Tolerance: Patient tolerated treatment well Patient left: with call bell/phone within reach;Other (comment)(seated EOB, handoff to PT)  OT Visit Diagnosis: Other abnormalities of gait and mobility (R26.89);Muscle weakness (generalized) (M62.81)                Time: [id number] OT Time Calculation (min): 15 min Charges:  OT General Charges $OT Visit: 1 Visit OT Evaluation $OT Eval Moderate Complexity: 1 Mod  Jolaine Artist, OT Acute Rehabilitation Services Pager [phone] Office [phone]    Delight Stare 06/22/2019, 12:47 PM

## 2019-06-22 NOTE — Progress Notes (Signed)
PROGRESS NOTE  Pamela Carney [id number] DOB: 08/14/1965   PCP: [name], NP  Patient is from: Home.  Occasionally uses cane at baseline.  DOA: 06/20/2019 LOS: 1  Brief Narrative / Interim history: 54 year old female with history of DM-2, CKD-3a, morbid obesity, recurrent UTIs, CVA, anxiety, depression and bipolar disorder presenting with nausea and vomiting and admitted for acute renal failure with azotemia.  Patient had nausea and diarrhea about 2 weeks ago.  Diarrhea lasted 4 to 5 days and resolved.  Nausea persisted despite trial of Zofran at home.  She has had poor p.o. intake due to nausea.  No fever or abdominal pain.  Also had dysuria.  Admits to occasional ibuprofen use.  In ED, slightly tachycardic.  K5.2. Cr 8.13 (baseline 1.2-1.5).  BUN 116.  CO2 15.  AG 21.  WBC 10.1.  Lactate 2.5>> 0.6.  UA with moderate LE.  Started on IV fluid, antiemetics and IV Rocephin and admitted for AKI and UTI.  Subjective: Seen and examined earlier this morning.  No major events overnight or this morning.  Still with some nausea.  Denies emesis or abdominal pain.  Also reports neurocardiac pain in her feet.  Denies chest pain, dyspnea, GI or UTI symptoms.  Objective: Vitals:   06/21/19 2157 06/22/19 0207 06/22/19 0546 06/22/19 1459  BP: 101/61 (!) 85/63 90/60 (!) 86/68  Pulse: 87 72 74 78  Resp: 17 17 17 18   Temp: 98.5 F (36.9 C) 98.2 F (36.8 C) 98.2 F (36.8 C) 98.6 F (37 C)  TempSrc: Oral Oral Oral Oral  SpO2: 100% 95% 98% 97%  Weight:      Height:        Intake/Output Summary (Last 24 hours) at 06/22/2019 1519 Last data filed at 06/22/2019 1504 Gross per 24 hour  Intake 1720.35 ml  Output --  Net 1720.35 ml   Filed Weights   06/20/19 2059  Weight: 90.7 kg    Examination:  GENERAL: No acute distress.  Appears well.  HEENT: MMM.  Vision and hearing grossly intact.  NECK: Supple.  No apparent JVD.  RESP:  No IWOB. Good air movement bilaterally. CVS:  RRR.  Heart sounds normal.  ABD/GI/GU: Bowel sounds present. Soft. Non tender.  MSK/EXT:  Moves extremities. No apparent deformity. No edema.  SKIN: no apparent skin lesion or wound NEURO: Awake, alert and oriented appropriately.  No apparent focal neuro deficit. PSYCH: Calm. Normal affect.  Procedures:  None  Assessment & Plan: AKI on CKD-3A with azotemia/possible uremia: Baseline Cr 1.2-1.5 > 8.13 (admit) > 6.67> 3.54: BUN 116 > 80.  AKI likely prerenal in the setting of dehydration due to diarrhea and nausea.  Nausea could be due to uremia.  -Continue IV normal saline at 100 cc an hour -Avoid nephrotoxic meds -Continue monitoring  Nausea/diarrhea/dehydration: patient could have viral gastroenteritis although no significant abdominal pain.  -IV fluid as above -Continue antiemetics  Lactic acidosis/anion gap metabolic acidosis-likely due to dehydration/renal failure and uremia.  Improved -Start sodium bicarbonate 650 mg 3 times daily. -Continue monitoring  Hyperkalemia: Likely due to renal failure.  Resolved. -Continue monitoring  Urinary tract infection: has dysuria concerning UA.  Not sure if urine culture has been sent in ED.  Blood cultures negative. -Continue IV ceftriaxone -Order urine culture-cannot tell if this has been sent  Uncontrolled DM-2 with hyperglycemia: A1c 7.4% on 04/28/2019. Recent Labs    06/21/19 2142 06/22/19 0756 06/22/19 1201  GLUCAP 82 107* 130*  -Continue current regimen and  a statin.  History of CVA: Stable.  No focal neuro deficit. -Continue home Plavix and started  Anxiety/bipolar disorder/chronic pain/migraine -Continue home Cymbalta and topiramate. -Added Aspercreme for neuropathic pain.  Oral candidiasis -Received Diflucan in ED. -Nystatin suspension     Hypomagnesemia: -Replenish and recheck.             DVT prophylaxis: Subcu heparin Code Status: Full code Family Communication: Patient and/or RN. Available if any question.    Discharge barrier: AKI with azotemia requiring IV fluid and antiemetics for nausea. Patient is from: Home Final disposition: Likely home in the next 24 to 48 hours  Consultants: None   Microbiology summarized: COVID-19 negative. Blood cultures negative Urine cultures pending  Sch Meds:  Scheduled Meds: . atorvastatin  80 mg Oral q1800  . clopidogrel  75 mg Oral Daily  . DULoxetine  30 mg Oral Daily  . heparin  5,000 Units Subcutaneous Q8H  . insulin aspart  0-9 Units Subcutaneous TID WC  . nystatin  5 mL Oral QID  . sodium bicarbonate  650 mg Oral TID  . topiramate  100 mg Oral BID   Continuous Infusions: . sodium chloride 100 mL/hr at 06/22/19 1147  . cefTRIAXone (ROCEPHIN)  IV Stopped (06/21/19 2209)   PRN Meds:.metoCLOPramide (REGLAN) injection, Muscle Rub  Antimicrobials: Anti-infectives (From admission, onward)   Start     Dose/Rate Route Frequency Ordered Stop   06/21/19 2200  cefTRIAXone (ROCEPHIN) 1 g in sodium chloride 0.9 % 100 mL IVPB     1 g 200 mL/hr over 30 Minutes Intravenous Every 24 hours 06/21/19 0414 06/23/19 2159   06/21/19 0130  cefTRIAXone (ROCEPHIN) 1 g in sodium chloride 0.9 % 100 mL IVPB     1 g 200 mL/hr over 30 Minutes Intravenous  Once 06/21/19 0115 06/21/19 0505   06/21/19 0115  fluconazole (DIFLUCAN) tablet 150 mg     150 mg Oral  Once 06/21/19 0115 06/21/19 0254       I have personally reviewed the following labs and images: CBC: Recent Labs  Lab 06/20/19 2111 06/21/19 0650 06/22/19 0245  WBC 10.1 9.8 8.9  HGB 16.3* 13.6 12.7  HCT 50.8* 42.8 39.2  MCV 96.6 99.1 95.4  PLT 383 254 273   BMP &GFR Recent Labs  Lab 06/20/19 2111 06/21/19 0650 06/22/19 0245  NA 138 138 142  K 5.2* 4.4 3.5  CL 102 108 110  CO2 15* 13* 16*  GLUCOSE 125* 98 107*  BUN 116* 108* 80*  CREATININE 8.13* 6.67* 3.54*  CALCIUM 10.2 8.5* 9.2  MG  --  1.8 1.5*  PHOS  --  5.1* 3.5   Estimated Creatinine Clearance: 19.9 mL/min (A) (by C-G formula  based on SCr of 3.54 mg/dL (H)). Liver & Pancreas: Recent Labs  Lab 06/20/19 2111 06/22/19 0245  AST 17  --   ALT 20  --   ALKPHOS 126  --   BILITOT 0.3  --   PROT 8.3*  --   ALBUMIN 4.3 3.3*   Recent Labs  Lab 06/20/19 2111  LIPASE 44   No results for input(s): AMMONIA in the last 168 hours. Diabetic: No results for input(s): HGBA1C in the last 72 hours. Recent Labs  Lab 06/21/19 1155 06/21/19 1714 06/21/19 2142 06/22/19 0756 06/22/19 1201  GLUCAP 107* 71 82 107* 130*   Cardiac Enzymes: No results for input(s): CKTOTAL, CKMB, CKMBINDEX, TROPONINI in the last 168 hours. No results for input(s): PROBNP in the last 8760 hours.  Coagulation Profile: No results for input(s): INR, PROTIME in the last 168 hours. Thyroid Function Tests: No results for input(s): TSH, T4TOTAL, FREET4, T3FREE, THYROIDAB in the last 72 hours. Lipid Profile: No results for input(s): CHOL, HDL, LDLCALC, TRIG, CHOLHDL, LDLDIRECT in the last 72 hours. Anemia Panel: No results for input(s): VITAMINB12, FOLATE, FERRITIN, TIBC, IRON, RETICCTPCT in the last 72 hours. Urine analysis:    Component Value Date/Time   COLORURINE YELLOW 06/20/2019 2230   APPEARANCEUR CLOUDY (A) 06/20/2019 2230   LABSPEC 1.015 06/20/2019 2230   PHURINE 5.0 06/20/2019 2230   GLUCOSEU NEGATIVE 06/20/2019 2230   HGBUR SMALL (A) 06/20/2019 2230   BILIRUBINUR NEGATIVE 06/20/2019 2230   BILIRUBINUR negative 05/25/2019 1652   KETONESUR NEGATIVE 06/20/2019 2230   PROTEINUR 100 (A) 06/20/2019 2230   UROBILINOGEN 0.2 05/25/2019 1652   UROBILINOGEN 0.2 03/31/2010 0246   NITRITE NEGATIVE 06/20/2019 2230   LEUKOCYTESUR MODERATE (A) 06/20/2019 2230   Sepsis Labs: Invalid input(s): PROCALCITONIN, [place]  Microbiology: Recent Results (from the past 240 hour(s))  Blood culture (routine x 2)     Status: None (Preliminary result)   Collection Time: 06/21/19  1:44 AM   Specimen: BLOOD  Result Value Ref Range Status    Specimen Description BLOOD RIGHT HAND  Final   Special Requests   Final    BOTTLES DRAWN AEROBIC AND ANAEROBIC Blood Culture adequate volume   Culture   Final    NO GROWTH 1 DAY Performed at [facility], [address] [place], [place] [address]    Report Status PENDING  Incomplete  Blood culture (routine x 2)     Status: None (Preliminary result)   Collection Time: 06/21/19  2:30 AM   Specimen: BLOOD  Result Value Ref Range Status   Specimen Description BLOOD RIGHT FOREARM  Final   Special Requests   Final    BOTTLES DRAWN AEROBIC AND ANAEROBIC Blood Culture adequate volume   Culture   Final    NO GROWTH 1 DAY Performed at [facility], [place] [address] [place], [place] [address]    Report Status PENDING  Incomplete  SARS CORONAVIRUS 2 (TAT 6-24 HRS) Nasopharyngeal Nasopharyngeal Swab     Status: None   Collection Time: 06/21/19  2:54 AM   Specimen: Nasopharyngeal Swab  Result Value Ref Range Status   SARS Coronavirus 2 NEGATIVE NEGATIVE Final    Comment: (NOTE) SARS-CoV-2 target nucleic acids are NOT DETECTED. The SARS-CoV-2 RNA is generally detectable in upper and lower respiratory specimens during the acute phase of infection. Negative results do not preclude SARS-CoV-2 infection, do not rule out co-infections with other pathogens, and should not be used as the sole basis for treatment or other patient management decisions. Negative results must be combined with clinical observations, patient history, and epidemiological information. The expected result is Negative. Fact Sheet for Patients: SugarRoll.be Fact Sheet for Healthcare Providers: https://www.woods-mathews.com/ This test is not yet approved or cleared by the [place] FDA and  has been authorized for detection and/or diagnosis of SARS-CoV-2 by FDA under an Emergency Use Authorization (EUA). This EUA will remain  in effect (meaning this test can be used)  for the duration of the COVID-19 declaration under Section 56 4(b)(1) of the Act, 21 U.S.C. section 360bbb-3(b)(1), unless the authorization is terminated or revoked sooner. Performed at [facility], [place] [address], [place],  [address]     Radiology Studies: No results found.   Mat Stuard T. Cabazon  If 7PM-7AM, please contact night-coverage [email or web address] Password [facility] 06/22/2019, 3:19 PM

## 2019-06-22 NOTE — Progress Notes (Signed)
PT Progress Note for Charges    06/22/19 1300  PT Visit Information  Last PT Received On 06/22/19  PT General Charges  $$ ACUTE PT VISIT 1 Visit  PT Evaluation  $PT Eval Moderate Complexity 1 Mod  Eduard Clos, PT, DPT  Acute Rehabilitation Services Pager [phone] Office [phone]

## 2019-06-22 NOTE — Progress Notes (Deleted)
PT Progress Note for Charges    06/22/19 1300  PT Visit Information  Last PT Received On 06/22/19  PT General Charges  $$ ACUTE PT VISIT 1 Visit  PT Treatments  $Gait Training 8-22 mins  Anastasio Champion, DPT  Acute Rehabilitation Services Pager [phone] Office [phone]

## 2019-06-23 ENCOUNTER — Encounter: Payer: Self-pay | Admitting: Nurse Practitioner

## 2019-06-23 DIAGNOSIS — E876 Hypokalemia: Secondary | ICD-10-CM

## 2019-06-23 LAB — CBC
HCT: 38.2 % (ref 36.0–46.0)
Hemoglobin: 12.7 g/dL (ref 12.0–15.0)
MCH: 31.4 pg (ref 26.0–34.0)
MCHC: 33.2 g/dL (ref 30.0–36.0)
MCV: 94.3 fL (ref 80.0–100.0)
Platelets: 259 10*3/uL (ref 150–400)
RBC: 4.05 MIL/uL (ref 3.87–5.11)
RDW: 14 % (ref 11.5–15.5)
WBC: 7 10*3/uL (ref 4.0–10.5)
nRBC: 0 % (ref 0.0–0.2)

## 2019-06-23 LAB — RENAL FUNCTION PANEL
Albumin: 3.3 g/dL — ABNORMAL LOW (ref 3.5–5.0)
Anion gap: 14 (ref 5–15)
BUN: 44 mg/dL — ABNORMAL HIGH (ref 6–20)
CO2: 17 mmol/L — ABNORMAL LOW (ref 22–32)
Calcium: 9.1 mg/dL (ref 8.9–10.3)
Chloride: 111 mmol/L (ref 98–111)
Creatinine, Ser: 2.05 mg/dL — ABNORMAL HIGH (ref 0.44–1.00)
GFR calc Af Amer: 31 mL/min — ABNORMAL LOW (ref >60)
GFR calc non Af Amer: 27 mL/min — ABNORMAL LOW (ref >60)
Glucose, Bld: 118 mg/dL — ABNORMAL HIGH (ref 70–99)
Phosphorus: 2.7 mg/dL (ref 2.5–4.6)
Potassium: 3.4 mmol/L — ABNORMAL LOW (ref 3.5–5.1)
Sodium: 142 mmol/L (ref 135–145)

## 2019-06-23 LAB — GLUCOSE, CAPILLARY
Glucose-Capillary: 116 mg/dL — ABNORMAL HIGH (ref 70–99)
Glucose-Capillary: 114 mg/dL — ABNORMAL HIGH (ref 70–99)
Glucose-Capillary: 192 mg/dL — ABNORMAL HIGH (ref 70–99)
Glucose-Capillary: 155 mg/dL — ABNORMAL HIGH (ref 70–99)

## 2019-06-23 LAB — MAGNESIUM: Magnesium: 1.7 mg/dL (ref 1.7–2.4)

## 2019-06-23 MED ORDER — SODIUM CHLORIDE 0.9 % IV SOLN
1.0000 g | INTRAVENOUS | Status: DC
  Administered 2019-06-23: 1 g via INTRAVENOUS
  Filled 2019-06-23: qty 1
  Filled 2019-06-23: qty 10

## 2019-06-23 MED ORDER — POTASSIUM CHLORIDE CRYS ER 20 MEQ PO TBCR
40.0000 meq | EXTENDED_RELEASE_TABLET | ORAL | Status: AC
  Administered 2019-06-23 (×2): 40 meq via ORAL
  Filled 2019-06-23 (×2): qty 2

## 2019-06-23 MED ORDER — DULOXETINE HCL 60 MG PO CPEP: 60.0000 mg | ORAL_CAPSULE | Freq: Every day | ORAL | 0 refills | Status: DC

## 2019-06-23 MED ORDER — MAGNESIUM SULFATE 2 GM/50ML IV SOLN
2.0000 g | Freq: Once | INTRAVENOUS | Status: AC
  Administered 2019-06-23: 08:00:00 2 g via INTRAVENOUS
  Filled 2019-06-23: qty 50

## 2019-06-23 NOTE — Plan of Care (Signed)

## 2019-06-23 NOTE — TOC Initial Note (Addendum)
Transition of Care [facility] - Initial/Assessment Note    Patient Details  Name: Pamela Carney MRN: [id number] Date of Birth: 1965/05/06  Transition of Care [facility]) CM/SW Contact:    Marilu Favre, RN Phone Number: 06/23/2019, 8:19 AM  Clinical Narrative:                 Late note. Spoke to patient 06/22/19. Patient from home with son. Has DME. PT recommended OP PT. Discussed with patient. Choice provided. Patient wants [facility] on [place]. Order entered. Patient's ex husband can provide transportation. Confirmed patient is active with [facility]. Expected Discharge Plan: Home/Self Care Barriers to Discharge: Continued Medical Work up   Patient Goals and CMS Choice Patient states their goals for this hospitalization and ongoing recovery are:: to return to home CMS Medicare.gov Compare Post Acute Care list provided to:: Patient Choice offered to / list presented to : Patient  Expected Discharge Plan and Services Expected Discharge Plan: Home/Self Care     Post Acute Care Choice: (OP PT) Living arrangements for the past 2 months: Single Family Home                 DME Arranged: N/A         HH Arranged: NA          Prior Living Arrangements/Services Living arrangements for the past 2 months: Single Family Home Lives with:: Adult Children Patient language and need for interpreter reviewed:: Yes Do you feel safe going back to the place where you live?: Yes      Need for Family Participation in Patient Care: Yes (Comment) Care giver support system in place?: Yes (comment)   Criminal Activity/Legal Involvement Pertinent to Current Situation/Hospitalization: No - Comment as needed  Activities of Daily Living      Permission Sought/Granted   Permission granted to share information with : No              Emotional Assessment Appearance:: Appears stated age Attitude/Demeanor/Rapport: Engaged Affect (typically observed):  Accepting Orientation: : Oriented to Self, Oriented to Place, Oriented to  Time, Oriented to Situation Alcohol / Substance Use: Not Applicable Psych Involvement: No (comment)  Admission diagnosis:  Dehydration [E86.0] Acute renal failure (ARF) (HCC) [N17.9] Acute cystitis with hematuria [N30.01] Acute renal failure, unspecified acute renal failure type [facility]) [N17.9] Patient Active Problem List   Diagnosis Date Noted  . Acute renal failure (ARF) ([place]) 06/21/2019  . Nausea 06/21/2019  . Dehydration 06/21/2019  . HLD (hyperlipidemia) 06/21/2019  . Acute metabolic encephalopathy [id number]  . UTI (urinary tract infection) 04/27/2019  . Cerebral embolism with cerebral infarction 12/22/2018  . Septic shock ([place]) 12/15/2018  . Ureteral stone with hydronephrosis   . Acute renal failure superimposed on stage 3 chronic kidney disease ([place])   . Type 2 diabetes mellitus with other specified complication ([place]) [id number]  . Bipolar 1 disorder ([place]) 02/01/2018  . IUD (intrauterine device) in place 02/01/2018  . Obesity 02/01/2018  . Healthcare maintenance 02/01/2018  . CKD (chronic kidney disease), stage III 12/28/2017  . 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 Acute cystitis with hematuria   . Chronic interstitial nephritis 12/24/2017  . Generalized weakness 12/24/2017  . Sepsis ([place]) 12/24/2017  . Hypokalemia 12/24/2017  . Oral candidiasis 12/24/2017  . Neuropathy involving both lower extremities 01/31/2014  . Perimenopausal 12/29/2012  . Family planning, IUD (intrauterine device) check/reinsertion/removal 12/28/2012  . Pain 12/27/2012   PCP:  [name], NP Pharmacy:  [facility] [place] - [address] [place] [address] Phone: [phone] Fax: [phone]     Social Determinants of Health (SDOH) Interventions    Readmission Risk Interventions No flowsheet data found.

## 2019-06-23 NOTE — Progress Notes (Signed)
PROGRESS NOTE  Pamela Carney [id number] DOB: 1965-12-21   PCP: [name], NP  Patient is from: Home.  Occasionally uses cane at baseline.  DOA: 06/20/2019 LOS: 2  Brief Narrative / Interim history: 54 year old female with history of DM-2, CKD-3a, morbid obesity, recurrent UTIs, CVA, anxiety, depression and bipolar disorder presenting with nausea and vomiting and admitted for acute renal failure with azotemia.  Patient had nausea and diarrhea about 2 weeks ago.  Diarrhea lasted 4 to 5 days and resolved.  Nausea persisted despite trial of Zofran at home.  She has had poor p.o. intake due to nausea.  No fever or abdominal pain.  Also had dysuria.  Admits to occasional ibuprofen use.  In ED, slightly tachycardic.  K5.2. Cr 8.13 (baseline 1.2-1.5).  BUN 116.  CO2 15.  AG 21.  WBC 10.1.  Lactate 2.5>> 0.6.  UA with moderate LE.  Started on IV fluid, antiemetics and IV Rocephin and admitted for AKI and UTI.  Subjective: Seen and examined earlier this morning.  No major events overnight or this morning.  Renal function improved but she is anxious about going home given ongoing nausea and poor p.o. intake.  No other complaints.  She denies chest pain, dyspnea, GI or UTI symptoms.  Objective: Vitals:   06/22/19 1459 06/22/19 1713 06/22/19 2058 06/23/19 0552  BP: (!) 86/68 99/68 (!) 87/60 106/69  Pulse: 78  68 64  Resp: 18  17 17   Temp: 98.6 F (37 C)  97.8 F (36.6 C) 98.3 F (36.8 C)  TempSrc: Oral  Oral Oral  SpO2: 97%  99% 99%  Weight:      Height:        Intake/Output Summary (Last 24 hours) at 06/23/2019 1453 Last data filed at 06/23/2019 0900 Gross per 24 hour  Intake 1128.66 ml  Output --  Net 1128.66 ml   Filed Weights   06/20/19 2059  Weight: 90.7 kg    Examination:  GENERAL: No apparent distress.  Nontoxic. HEENT: MMM.  Vision and hearing grossly intact.  NECK: Supple.  No apparent JVD.  RESP:  No IWOB. Good air movement bilaterally. CVS:  RRR. Heart  sounds normal.  ABD/GI/GU: Bowel sounds present. Soft. Non tender.  MSK/EXT:  Moves extremities. No apparent deformity. No edema.  SKIN: no apparent skin lesion or wound NEURO: Awake, alert and oriented appropriately.  No apparent focal neuro deficit. PSYCH: Calm. Normal affect.  Procedures:  None  Assessment & Plan: AKI on CKD-3A with azotemia/possible uremia: Baseline Cr 1.2-1.5 > 8.13 (admit) > 6.67> 3.54> 2.05: BUN 116 > 80> 44.  AKI likely prerenal in the setting of dehydration due to diarrhea and nausea.  Nausea could be due to uremia.  -Avoid nephrotoxic meds -Continue monitoring  Nausea/diarrhea/dehydration: patient could have viral gastroenteritis although no significant abdominal pain.  -Continue as needed antiemetics -Advance to heart healthy and carb modified diet.  Lactic acidosis/anion gap metabolic acidosis-likely due to dehydration/renal failure and uremia.  Improved -Continue sodium bicarbonate 650 mg 3 times daily. -Continue monitoring  Hyperkalemia>> hypokalemia: Hypokalemia likely due to IV fluid.  Mg 1.7. -K-Dur 40 mEq x 2 -IV magnesium sulfate 2 g x 1  Urinary tract infection: has dysuria concerning UA.  Seems urine culture has not been sent.  Blood cultures negative. -Continue IV ceftriaxone empirically  Uncontrolled DM-2 with hyperglycemia: A1c 7.4% on 04/28/2019. Recent Labs    06/22/19 2052 06/23/19 0801 06/23/19 1221  GLUCAP 174* 116* 114*  -Continue current regimen and  a statin.  History of CVA: Stable.  No focal neuro deficit. -Continue home Plavix and started  Anxiety/bipolar disorder/chronic pain/migraine -Continue home Cymbalta and topiramate. -Added Aspercreme for neuropathic pain.  Oral candidiasis -Received Diflucan in ED. -Nystatin suspension                DVT prophylaxis: Subcu heparin Code Status: Full code Family Communication: Patient and/or RN. Available if any question.   Discharge barrier: AKI with azotemia with  ongoing nausea and poor p.o. intake Patient is from: Home Final disposition: Likely home on 4/3  Consultants: None   Microbiology summarized: COVID-19 negative. Blood cultures negative Urine cultures-not sent.  Sch Meds:  Scheduled Meds: . atorvastatin  80 mg Oral q1800  . clopidogrel  75 mg Oral Daily  . DULoxetine  30 mg Oral Daily  . heparin  5,000 Units Subcutaneous Q8H  . insulin aspart  0-9 Units Subcutaneous TID WC  . nystatin  5 mL Oral QID  . sodium bicarbonate  650 mg Oral TID  . topiramate  100 mg Oral BID   Continuous Infusions: . cefTRIAXone (ROCEPHIN)  IV     PRN Meds:.metoCLOPramide (REGLAN) injection, Muscle Rub, simethicone  Antimicrobials: Anti-infectives (From admission, onward)   Start     Dose/Rate Route Frequency Ordered Stop   06/23/19 2100  cefTRIAXone (ROCEPHIN) 1 g in sodium chloride 0.9 % 100 mL IVPB     1 g 200 mL/hr over 30 Minutes Intravenous Every 24 hours 06/23/19 0738 06/26/19 2059   06/21/19 2200  cefTRIAXone (ROCEPHIN) 1 g in sodium chloride 0.9 % 100 mL IVPB     1 g 200 mL/hr over 30 Minutes Intravenous Every 24 hours 06/21/19 0414 06/22/19 2144   06/21/19 0130  cefTRIAXone (ROCEPHIN) 1 g in sodium chloride 0.9 % 100 mL IVPB     1 g 200 mL/hr over 30 Minutes Intravenous  Once 06/21/19 0115 06/21/19 0505   06/21/19 0115  fluconazole (DIFLUCAN) tablet 150 mg     150 mg Oral  Once 06/21/19 0115 06/21/19 0254       I have personally reviewed the following labs and images: CBC: Recent Labs  Lab 06/20/19 2111 06/21/19 0650 06/22/19 0245 06/23/19 0233  WBC 10.1 9.8 8.9 7.0  HGB 16.3* 13.6 12.7 12.7  HCT 50.8* 42.8 39.2 38.2  MCV 96.6 99.1 95.4 94.3  PLT 383 254 273 259   BMP &GFR Recent Labs  Lab 06/20/19 2111 06/21/19 0650 06/22/19 0245 06/23/19 0233  NA 138 138 142 142  K 5.2* 4.4 3.5 3.4*  CL 102 108 110 111  CO2 15* 13* 16* 17*  GLUCOSE 125* 98 107* 118*  BUN 116* 108* 80* 44*  CREATININE 8.13* 6.67* 3.54* 2.05*   CALCIUM 10.2 8.5* 9.2 9.1  MG  --  1.8 1.5* 1.7  PHOS  --  5.1* 3.5 2.7   Estimated Creatinine Clearance: 34.3 mL/min (A) (by C-G formula based on SCr of 2.05 mg/dL (H)). Liver & Pancreas: Recent Labs  Lab 06/20/19 2111 06/22/19 0245 06/23/19 0233  AST 17  --   --   ALT 20  --   --   ALKPHOS 126  --   --   BILITOT 0.3  --   --   PROT 8.3*  --   --   ALBUMIN 4.3 3.3* 3.3*   Recent Labs  Lab 06/20/19 2111  LIPASE 44   No results for input(s): AMMONIA in the last 168 hours. Diabetic: No results for input(s):  HGBA1C in the last 72 hours. Recent Labs  Lab 06/22/19 1201 06/22/19 1602 06/22/19 2052 06/23/19 0801 06/23/19 1221  GLUCAP 130* 135* 174* 116* 114*   Cardiac Enzymes: No results for input(s): CKTOTAL, CKMB, CKMBINDEX, TROPONINI in the last 168 hours. No results for input(s): PROBNP in the last 8760 hours. Coagulation Profile: No results for input(s): INR, PROTIME in the last 168 hours. Thyroid Function Tests: No results for input(s): TSH, T4TOTAL, FREET4, T3FREE, THYROIDAB in the last 72 hours. Lipid Profile: No results for input(s): CHOL, HDL, LDLCALC, TRIG, CHOLHDL, LDLDIRECT in the last 72 hours. Anemia Panel: No results for input(s): VITAMINB12, FOLATE, FERRITIN, TIBC, IRON, RETICCTPCT in the last 72 hours. Urine analysis:    Component Value Date/Time   COLORURINE YELLOW 06/20/2019 2230   APPEARANCEUR CLOUDY (A) 06/20/2019 2230   LABSPEC 1.015 06/20/2019 2230   PHURINE 5.0 06/20/2019 2230   GLUCOSEU NEGATIVE 06/20/2019 2230   HGBUR SMALL (A) 06/20/2019 2230   BILIRUBINUR NEGATIVE 06/20/2019 2230   BILIRUBINUR negative 05/25/2019 1652   KETONESUR NEGATIVE 06/20/2019 2230   PROTEINUR 100 (A) 06/20/2019 2230   UROBILINOGEN 0.2 05/25/2019 1652   UROBILINOGEN 0.2 03/31/2010 0246   NITRITE NEGATIVE 06/20/2019 2230   LEUKOCYTESUR MODERATE (A) 06/20/2019 2230   Sepsis Labs: Invalid input(s): PROCALCITONIN, [place]  Microbiology: Recent Results  (from the past 240 hour(s))  Blood culture (routine x 2)     Status: None (Preliminary result)   Collection Time: 06/21/19  1:44 AM   Specimen: BLOOD  Result Value Ref Range Status   Specimen Description BLOOD RIGHT HAND  Final   Special Requests   Final    BOTTLES DRAWN AEROBIC AND ANAEROBIC Blood Culture adequate volume   Culture   Final    NO GROWTH 2 DAYS Performed at [facility], [address] [place], [place] [address]    Report Status PENDING  Incomplete  Blood culture (routine x 2)     Status: None (Preliminary result)   Collection Time: 06/21/19  2:30 AM   Specimen: BLOOD  Result Value Ref Range Status   Specimen Description BLOOD RIGHT FOREARM  Final   Special Requests   Final    BOTTLES DRAWN AEROBIC AND ANAEROBIC Blood Culture adequate volume   Culture   Final    NO GROWTH 2 DAYS Performed at [facility], [place] [address], [place], [place] [address]    Report Status PENDING  Incomplete  SARS CORONAVIRUS 2 (TAT 6-24 HRS) Nasopharyngeal Nasopharyngeal Swab     Status: None   Collection Time: 06/21/19  2:54 AM   Specimen: Nasopharyngeal Swab  Result Value Ref Range Status   SARS Coronavirus 2 NEGATIVE NEGATIVE Final    Comment: (NOTE) SARS-CoV-2 target nucleic acids are NOT DETECTED. The SARS-CoV-2 RNA is generally detectable in upper and lower respiratory specimens during the acute phase of infection. Negative results do not preclude SARS-CoV-2 infection, do not rule out co-infections with other pathogens, and should not be used as the sole basis for treatment or other patient management decisions. Negative results must be combined with clinical observations, patient history, and epidemiological information. The expected result is Negative. Fact Sheet for Patients: SugarRoll.be Fact Sheet for Healthcare Providers: https://www.woods-mathews.com/ This test is not yet approved or cleared by the [place] FDA  and  has been authorized for detection and/or diagnosis of SARS-CoV-2 by FDA under an Emergency Use Authorization (EUA). This EUA will remain  in effect (meaning this test can be used) for  the duration of the COVID-19 declaration under Section 56 4(b)(1) of the Act, 21 U.S.C. section 360bbb-3(b)(1), unless the authorization is terminated or revoked sooner. Performed at [facility], [place] [address], [place], [place] [address]     Radiology Studies: No results found.   Brionne Mertz T. Dawson  If 7PM-7AM, please contact night-coverage [email or web address] Password [email or web address] 06/23/2019, 2:53 PM

## 2019-06-24 ENCOUNTER — Encounter: Payer: Self-pay | Admitting: Nurse Practitioner

## 2019-06-24 DIAGNOSIS — T83511A Infection and inflammatory reaction due to indwelling urethral catheter, initial encounter: Secondary | ICD-10-CM

## 2019-06-24 DIAGNOSIS — E785 Hyperlipidemia, unspecified: Secondary | ICD-10-CM

## 2019-06-24 DIAGNOSIS — N1831 Chronic kidney disease, stage 3a: Secondary | ICD-10-CM

## 2019-06-24 DIAGNOSIS — N189 Chronic kidney disease, unspecified: Secondary | ICD-10-CM

## 2019-06-24 DIAGNOSIS — N390 Urinary tract infection, site not specified: Secondary | ICD-10-CM

## 2019-06-24 LAB — RENAL FUNCTION PANEL
Albumin: 3.2 g/dL — ABNORMAL LOW (ref 3.5–5.0)
Anion gap: 11 (ref 5–15)
BUN: 28 mg/dL — ABNORMAL HIGH (ref 6–20)
CO2: 17 mmol/L — ABNORMAL LOW (ref 22–32)
Calcium: 9 mg/dL (ref 8.9–10.3)
Chloride: 113 mmol/L — ABNORMAL HIGH (ref 98–111)
Creatinine, Ser: 1.58 mg/dL — ABNORMAL HIGH (ref 0.44–1.00)
GFR calc Af Amer: 43 mL/min — ABNORMAL LOW (ref >60)
GFR calc non Af Amer: 37 mL/min — ABNORMAL LOW (ref >60)
Glucose, Bld: 114 mg/dL — ABNORMAL HIGH (ref 70–99)
Phosphorus: 2.1 mg/dL — ABNORMAL LOW (ref 2.5–4.6)
Potassium: 3.8 mmol/L (ref 3.5–5.1)
Sodium: 141 mmol/L (ref 135–145)

## 2019-06-24 LAB — CBC
HCT: 36.7 % (ref 36.0–46.0)
Hemoglobin: 11.9 g/dL — ABNORMAL LOW (ref 12.0–15.0)
MCH: 31.2 pg (ref 26.0–34.0)
MCHC: 32.4 g/dL (ref 30.0–36.0)
MCV: 96.3 fL (ref 80.0–100.0)
Platelets: 254 10*3/uL (ref 150–400)
RBC: 3.81 MIL/uL — ABNORMAL LOW (ref 3.87–5.11)
RDW: 14.4 % (ref 11.5–15.5)
WBC: 7.5 10*3/uL (ref 4.0–10.5)
nRBC: 0 % (ref 0.0–0.2)

## 2019-06-24 LAB — MAGNESIUM: Magnesium: 1.7 mg/dL (ref 1.7–2.4)

## 2019-06-24 LAB — GLUCOSE, CAPILLARY
Glucose-Capillary: 123 mg/dL — ABNORMAL HIGH (ref 70–99)
Glucose-Capillary: 116 mg/dL — ABNORMAL HIGH (ref 70–99)

## 2019-06-24 MED ORDER — FLUTICASONE PROPIONATE 50 MCG/ACT NA SUSP: 1.0000 | Freq: Every day | NASAL | 2 refills | Status: AC

## 2019-06-24 MED ORDER — CEPHALEXIN 500 MG PO CAPS: 500.0000 mg | ORAL_CAPSULE | Freq: Three times a day (TID) | ORAL | 0 refills | Status: AC

## 2019-06-24 NOTE — Discharge Summary (Signed)
Physician Discharge Summary  Pamela Carney ZOX:[id number] DOB: [birth year] DOA: 06/20/2019  PCP: [name], NP  Admit date: 06/20/2019 Discharge date: 06/24/2019  Admitted From: Home Disposition: Home  Recommendations for Outpatient Follow-up:  1. Follow ups as below. 2. Please obtain CBC/BMP/Mag at follow up 3. Please follow up on the following pending results: None  Home Health: Outpatient PT Equipment/Devices: 3 in 1 commode  Discharge Condition: Stable CODE STATUS: Full code  Follow-up Information    [name], NP. Schedule an appointment as soon as possible for a visit.   Specialty: Nurse Practitioner Contact information: [place] [address] [phone]        [place]. Schedule an appointment as soon as possible for a visit.   Specialty: Rehabilitation Contact information: [address] [place]          Hospital Course: 54 year old female with history of DM-2, CKD-3a, morbid obesity, recurrent UTIs, CVA, anxiety, depression and bipolar disorder presenting with nausea and vomiting and admitted for acute renal failure with azotemia.  Patient had nausea and diarrhea about 2 weeks ago.  Diarrhea lasted 4 to 5 days and resolved.  Nausea persisted despite trial of Zofran at home.  She has had poor p.o. intake due to nausea.  No fever or abdominal pain.  Also had dysuria.  Admits to occasional ibuprofen use.  In ED, slightly tachycardic.  K5.2. Cr 8.13 (baseline 1.2-1.5).  BUN 116.  CO2 15.  AG 21.  WBC 10.1.  Lactate 2.5>> 0.6.  UA with moderate LE.  Started on IV fluid, antiemetics and IV Rocephin and admitted for AKI and UTI.  AKI resolved, and Cr down to 1.58 which is about baseline.  She has not had further nausea or emesis.  Patient received 3 doses of IV ceftriaxone for UTI and discharged on Keflex for UTI.  Patient  was counseled against NSAID use.   See individual problem list below for more on hospital course.  Discharge Diagnoses:  AKI on CKD-3A with azotemia/possible uremia: Baseline Cr 1.2-1.5 > 8.13 (admit) > 6.67> 3.54> 2.05>1.58: BUN 116 > 80> 44>28.  AKI likely prerenal in the setting of dehydration due to diarrhea and nausea.  Nausea and emesis resolved.  Renal function continued to improve off IV fluid. -Advised against NSAID -Recheck renal function in 1 week  Nausea/diarrhea/dehydration: Viral gastroenteritis?  Resolved.  Lactic acidosis/anion gap metabolic acidosis-likely due to dehydration/renal failure and uremia.  Anion gap closed.  Metabolic acidosis improved.  Lactic acidosis resolved. -Recheck renal function at follow-up  Hyperkalemia>> hypokalemia:  Resolved.  Urinary tract infection: has dysuria concerning UA.  Seems urine culture has not been sent.  Blood cultures negative. -IV ceftriaxone for 3 days.  Discharged on Keflex for 2 more days.  Uncontrolled DM-2 with hyperglycemia: A1c 7.4% on 04/28/2019. Recent Labs    06/23/19 2205 06/24/19 0757 06/24/19 1142  GLUCAP 155* 123* 116*  -Discharged on home medications.  History of CVA: Stable.  No focal neuro deficit. -Continue home Plavix and statin  Anxiety/bipolar disorder/chronic pain/migraine -Continue home Cymbalta and topiramate.  Oral candidiasis: Diflucan in ED and nystatin suspension in-house.  Resolved.  Morbid obesity: Body mass index is 35.43 kg/m. and diabetic -Encourage lifestyle change to lose weight.  Allergic rhinitis -Flonase and Zyrtec.   Discharge Instructions  Discharge Instructions    Ambulatory referral to Physical Therapy   Complete by: As directed    Call MD  for:  difficulty breathing, headache or visual disturbances   Complete by: As directed    Call MD for:  extreme fatigue   Complete by: As directed    Call MD for:  persistant dizziness or light-headedness   Complete by: As  directed    Call MD for:  persistant nausea and vomiting   Complete by: As directed    Diet - low sodium heart healthy   Complete by: As directed    Diet Carb Modified   Complete by: As directed    Discharge instructions   Complete by: As directed    It has been a pleasure taking care of you! You were hospitalized with acute kidney injury likely due to dehydration from nausea and vomiting, possible urinary tract infection.  Your kidney has recovered with intravenous IV fluid.  We have started you on antibiotic for possible urinary tract infection.  We are discharging you more antibiotics to complete treatment course.  We strongly encourage you to stop using ibuprofen or any other over-the-counter pain medication other than plain Tylenol. Please review your new medication list and the directions before you take your medications.  Please follow-up with your primary care doctor in 1 to 2 weeks for hospital follow-up and lab work.   Take care,   Increase activity slowly   Complete by: As directed      Allergies as of 06/24/2019      Reactions   Sulfa Antibiotics Hives, Swelling   Swelling site not recalled   Latex Rash   Tape Rash   Not tolerated well      Medication List    TAKE these medications   atorvastatin 80 MG tablet Commonly known as: LIPITOR Take 1 tablet (80 mg total) by mouth daily at 6 PM.   AZO CRANBERRY GUMMIES PO Take 2 tablets by mouth daily.   cephALEXin 500 MG capsule Commonly known as: KEFLEX Take 1 capsule (500 mg total) by mouth 3 (three) times daily for 2 days.   cetirizine 10 MG tablet Commonly known as: ZYRTEC Take 1 tablet (10 mg total) by mouth daily.   clopidogrel 75 MG tablet Commonly known as: PLAVIX Take 1 tablet (75 mg total) by mouth daily.   DULoxetine 60 MG capsule Commonly known as: Cymbalta Take 1 capsule (60 mg total) by mouth daily. What changed:   medication strength  how much to take  additional instructions     indapamide 2.5 MG tablet Commonly known as: LOZOL Take 2.5 mg by mouth every evening.   insulin glargine 100 UNIT/ML Solostar Pen Commonly known as: LANTUS Inject 15 Units into the skin at bedtime.   metFORMIN 500 MG tablet Commonly known as: GLUCOPHAGE Take 2 tablets (1,000 mg total) by mouth 2 (two) times daily with a meal.   nystatin cream Commonly known as: MYCOSTATIN Apply 1 application topically 2 (two) times daily. What changed:   when to take this  reasons to take this   ondansetron 4 MG tablet Commonly known as: Zofran Take 1 tablet (4 mg total) by mouth every 8 (eight) hours as needed for nausea or vomiting.   ONE TOUCH LANCETS Misc Use to check blood sugars every morning fasting and 2 hours after largest meal   One-A-Day Womens 50+ Advantage Tabs Take 1 tablet by mouth daily.   OneTouch Verio test strip Generic drug: glucose blood Use to check blood sugars every morning fasting and 2 hours after largest meal   OneTouch Verio w/Device Kit Use  to check blood sugars every morning fasting and 2 hours after largest meal   Pen Needles 31G X 8 MM Misc 1 each by Does not apply route daily.   topiramate 100 MG tablet Commonly known as: Topamax Take 1 tablet (100 mg total) by mouth 2 (two) times daily.            Durable Medical Equipment  (From admission, onward)         Start     Ordered   06/24/19 0742  DME 3-in-1  Once     06/24/19 0745          Consultations:  None  Procedures/Studies:   CUP PACEART REMOTE DEVICE CHECK  Result Date: 06/08/2019 [facility] summary report received. Battery status OK. Normal device function. No new symptom episodes, tachy episodes, brady, or pause episodes. No new AF episodes. Monthly summary reports and ROV/PRN Kathy Breach, RN, CCDS, [employer]      Discharge Exam: Vitals:   06/23/19 2208 06/24/19 0625  BP: (!) 116/55 (!) 142/92  Pulse: 66 79  Resp: 15 18  Temp: 98.4 F (36.9 C) 98 F  (36.7 C)  SpO2: 96% 98%    GENERAL: No acute distress.  Appears well.  HEENT: MMM.  Vision and hearing grossly intact.  NECK: Supple.  No apparent JVD.  RESP:  No IWOB. Good air movement bilaterally. CVS:  RRR. Heart sounds normal.  ABD/GI/GU: Bowel sounds present. Soft. Non tender.  MSK/EXT:  Moves extremities. No apparent deformity or edema.  SKIN: no apparent skin lesion or wound NEURO: Awake, alert and oriented appropriately.  No apparent focal neuro deficit. PSYCH: Calm. Normal affect.   The results of significant diagnostics from this hospitalization (including imaging, microbiology, ancillary and laboratory) are listed below for reference.     Microbiology: Recent Results (from the past 240 hour(s))  Blood culture (routine x 2)     Status: None (Preliminary result)   Collection Time: 06/21/19  1:44 AM   Specimen: BLOOD  Result Value Ref Range Status   Specimen Description BLOOD RIGHT HAND  Final   Special Requests   Final    BOTTLES DRAWN AEROBIC AND ANAEROBIC Blood Culture adequate volume   Culture   Final    NO GROWTH 2 DAYS Performed at [facility], [address] [place], [place] [address]    Report Status PENDING  Incomplete  Blood culture (routine x 2)     Status: None (Preliminary result)   Collection Time: 06/21/19  2:30 AM   Specimen: BLOOD  Result Value Ref Range Status   Specimen Description BLOOD RIGHT FOREARM  Final   Special Requests   Final    BOTTLES DRAWN AEROBIC AND ANAEROBIC Blood Culture adequate volume   Culture   Final    NO GROWTH 2 DAYS Performed at [facility], [place] [address], [place], [place] [address]    Report Status PENDING  Incomplete  SARS CORONAVIRUS 2 (TAT 6-24 HRS) Nasopharyngeal Nasopharyngeal Swab     Status: None   Collection Time: 06/21/19  2:54 AM   Specimen: Nasopharyngeal Swab  Result Value Ref Range Status   SARS Coronavirus 2 NEGATIVE NEGATIVE Final    Comment: (NOTE) SARS-CoV-2 target nucleic acids  are NOT DETECTED. The SARS-CoV-2 RNA is generally detectable in upper and lower respiratory specimens during the acute phase of infection. Negative results do not preclude SARS-CoV-2 infection, do not rule out co-infections with other pathogens, and should not  be used as the sole basis for treatment or other patient management decisions. Negative results must be combined with clinical observations, patient history, and epidemiological information. The expected result is Negative. Fact Sheet for Patients: SugarRoll.be Fact Sheet for Healthcare Providers: https://www.woods-mathews.com/ This test is not yet approved or cleared by the [place] FDA and  has been authorized for detection and/or diagnosis of SARS-CoV-2 by FDA under an Emergency Use Authorization (EUA). This EUA will remain  in effect (meaning this test can be used) for the duration of the COVID-19 declaration under Section 56 4(b)(1) of the Act, 21 U.S.C. section 360bbb-3(b)(1), unless the authorization is terminated or revoked sooner. Performed at [facility], [place] [address] [place], [place] [address]      Labs: BNP (last 3 results) No results for input(s): BNP in the last 8760 hours. Basic Metabolic Panel: Recent Labs  Lab 06/20/19 2111 06/21/19 0650 06/22/19 0245 06/23/19 0233 06/24/19 0352  NA 138 138 142 142 141  K 5.2* 4.4 3.5 3.4* 3.8  CL 102 108 110 111 113*  CO2 15* 13* 16* 17* 17*  GLUCOSE 125* 98 107* 118* 114*  BUN 116* 108* 80* 44* 28*  CREATININE 8.13* 6.67* 3.54* 2.05* 1.58*  CALCIUM 10.2 8.5* 9.2 9.1 9.0  MG  --  1.8 1.5* 1.7 1.7  PHOS  --  5.1* 3.5 2.7 2.1*   Liver Function Tests: Recent Labs  Lab 06/20/19 2111 06/22/19 0245 06/23/19 0233 06/24/19 0352  AST 17  --   --   --   ALT 20  --   --   --   ALKPHOS 126  --   --   --   BILITOT 0.3  --   --   --   PROT 8.3*  --   --   --   ALBUMIN 4.3 3.3* 3.3* 3.2*   Recent Labs  Lab  06/20/19 2111  LIPASE 44   No results for input(s): AMMONIA in the last 168 hours. CBC: Recent Labs  Lab 06/20/19 2111 06/21/19 0650 06/22/19 0245 06/23/19 0233 06/24/19 0352  WBC 10.1 9.8 8.9 7.0 7.5  HGB 16.3* 13.6 12.7 12.7 11.9*  HCT 50.8* 42.8 39.2 38.2 36.7  MCV 96.6 99.1 95.4 94.3 96.3  PLT 383 254 273 259 254   Cardiac Enzymes: No results for input(s): CKTOTAL, CKMB, CKMBINDEX, TROPONINI in the last 168 hours. BNP: Invalid input(s): POCBNP CBG: Recent Labs  Lab 06/22/19 2052 06/23/19 0801 06/23/19 1221 06/23/19 1618 06/23/19 2205  GLUCAP 174* 116* 114* 192* 155*   D-Dimer No results for input(s): DDIMER in the last 72 hours. Hgb A1c No results for input(s): HGBA1C in the last 72 hours. Lipid Profile No results for input(s): CHOL, HDL, LDLCALC, TRIG, CHOLHDL, LDLDIRECT in the last 72 hours. Thyroid function studies No results for input(s): TSH, T4TOTAL, T3FREE, THYROIDAB in the last 72 hours.  Invalid input(s): FREET3 Anemia work up No results for input(s): VITAMINB12, FOLATE, FERRITIN, TIBC, IRON, RETICCTPCT in the last 72 hours. Urinalysis    Component Value Date/Time   COLORURINE YELLOW 06/20/2019 2230   APPEARANCEUR CLOUDY (A) 06/20/2019 2230   LABSPEC 1.015 06/20/2019 2230   PHURINE 5.0 06/20/2019 2230   GLUCOSEU NEGATIVE 06/20/2019 2230   HGBUR SMALL (A) 06/20/2019 2230   BILIRUBINUR NEGATIVE 06/20/2019 2230   BILIRUBINUR negative 05/25/2019 1652   KETONESUR NEGATIVE 06/20/2019 2230   PROTEINUR 100 (A) 06/20/2019 2230   UROBILINOGEN 0.2 05/25/2019 1652   UROBILINOGEN 0.2 03/31/2010 0246  NITRITE NEGATIVE 06/20/2019 2230   LEUKOCYTESUR MODERATE (A) 06/20/2019 2230   Sepsis Labs Invalid input(s): PROCALCITONIN,  WBC,  LACTICIDVEN   Time coordinating discharge: 35 minutes  SIGNED:  Mercy Riding, MD  [facility] 06/24/2019, 7:45 AM  If 7PM-7AM, please contact night-coverage [email or web address] Password [email or web address]

## 2019-06-24 NOTE — Plan of Care (Signed)

## 2019-06-24 NOTE — Progress Notes (Signed)
Rosemary Holms to be D/C'd  per MD order. Discussed with the patient and all questions fully answered.  VSS, Skin clean, dry and intact without evidence of skin break down, no evidence of skin tears noted.  IV catheter discontinued intact. Site without signs and symptoms of complications. Dressing and pressure applied.  An After Visit Summary was printed and given to the patient. Patient received prescription.  D/c education completed with patient/family including follow up instructions, medication list, d/c activities limitations if indicated, with other d/c instructions as indicated by MD - patient able to verbalize understanding, all questions fully answered.   Patient instructed to return to ED, call 911, or call MD for any changes in condition.   Patient to be escorted via [place], and D/C home via private auto.

## 2019-06-24 NOTE — Plan of Care (Signed)

## 2019-06-26 ENCOUNTER — Other Ambulatory Visit: Payer: Self-pay

## 2019-06-26 ENCOUNTER — Telehealth: Payer: Self-pay

## 2019-06-26 ENCOUNTER — Encounter: Payer: Self-pay | Admitting: Obstetrics and Gynecology

## 2019-06-26 ENCOUNTER — Ambulatory Visit (INDEPENDENT_AMBULATORY_CARE_PROVIDER_SITE_OTHER): Payer: 59 | Admitting: Obstetrics and Gynecology

## 2019-06-26 DIAGNOSIS — Z30432 Encounter for removal of intrauterine contraceptive device: Secondary | ICD-10-CM | POA: Diagnosis not present

## 2019-06-26 LAB — CULTURE, BLOOD (ROUTINE X 2)
Culture: NO GROWTH
Culture: NO GROWTH
Special Requests: ADEQUATE
Special Requests: ADEQUATE

## 2019-06-26 NOTE — Telephone Encounter (Signed)
Transition Care Management Follow-up Telephone Call Date of discharge and from where: 06/24/2019, [facility].   Call placed to patient # [phone], message left with call back requested to this CM.   Patient needs to schedule hospital follow up appointment .

## 2019-06-26 NOTE — Progress Notes (Signed)
   CHRISTIAN HETZER  November 28, 1965 [id number]  HPI The patient is a 54 y.o. G7P3 who presents today for IUD removal.  She says she has had IUD in place for many years and is not actually certain as to when it was placed but it is due for removal.  She has been amenorrheic with the IUD in place.  I discussed with the patient that we do not know if she is in fact menopausal at this point as she has been under the influence of the IUD for the past several years.  If she has been amenorrheic for 1 year from now, she will be considered menopausal at that point.  Indicates she is not sexually active and therefore not need of contraception at this point.   Physical Exam  BP 126/74   Ht 5\' 3"  (1.6 m)   Wt 208 lb (94.3 kg)   BMI 36.85 kg/m   General: Pleasant female, no acute distress, alert and oriented PELVIC EXAM: VULVA: normal appearing vulva with no masses, tenderness or lesions, atrophic changes noted, VAGINA: normal appearing vagina with normal color and discharge, no lesions, CERVIX: normal appearing cervix without discharge or lesions  Procedure IUD removal The cervix was visualized with a Graves speculum.  IUD strings are visualized protruding 1 cm from external cervical os, grasped with a [place] forceps, and the IUD was removed in its entirety without difficulty.  The IUD was intact as demonstrated to patient and staff.  Patient tolerated the procedure well without complication.  Caryn Bee present for the procedure and examination       Joseph Pierini MD, [facility] 06/26/19

## 2019-06-27 ENCOUNTER — Ambulatory Visit (INDEPENDENT_AMBULATORY_CARE_PROVIDER_SITE_OTHER): Payer: 59 | Admitting: Adult Health

## 2019-06-27 ENCOUNTER — Telehealth: Payer: Self-pay

## 2019-06-27 ENCOUNTER — Encounter: Payer: Self-pay | Admitting: Adult Health

## 2019-06-27 ENCOUNTER — Telehealth: Payer: Self-pay | Admitting: Nurse Practitioner

## 2019-06-27 VITALS — BP 102/64 | HR 62 | Temp 97.2°F | Ht 62.0 in | Wt 206.0 lb

## 2019-06-27 DIAGNOSIS — G43709 Chronic migraine without aura, not intractable, without status migrainosus: Secondary | ICD-10-CM | POA: Diagnosis not present

## 2019-06-27 DIAGNOSIS — E785 Hyperlipidemia, unspecified: Secondary | ICD-10-CM

## 2019-06-27 DIAGNOSIS — R5381 Other malaise: Secondary | ICD-10-CM

## 2019-06-27 DIAGNOSIS — I639 Cerebral infarction, unspecified: Secondary | ICD-10-CM | POA: Diagnosis not present

## 2019-06-27 DIAGNOSIS — I10 Essential (primary) hypertension: Secondary | ICD-10-CM

## 2019-06-27 MED ORDER — TOPIRAMATE 100 MG PO TABS: 100.0000 mg | ORAL_TABLET | Freq: Two times a day (BID) | ORAL | 3 refills | Status: AC

## 2019-06-27 NOTE — Telephone Encounter (Signed)
Pt called to request to speak with Opal Sidles please call her back

## 2019-06-27 NOTE — Progress Notes (Signed)
[facility] [address] [place] [address] [phone]       OFFICE FOLLOW-UP NOTE  [name] Date of Birth:  Jul 03, 1965 Medical Record Number:  [id number]   Chief complaint: Chief Complaint  Patient presents with  . Follow-up    pt alone, rm 9. pt was in hospital last week due to having acute renal failure. pt is out of her topiramate       HPI:   Initial visit 03/21/2019 Dr. Leonie Man: [name] is a 54 year old Caucasian lady seen today for initial office follow-up visit following hospital admission for stroke in September 2020.  History is obtained from the patient and review of electronic medical records and I personally reviewed imaging films in PACS.  She presented on 12/21/2018 with vision abnormalities in the right eye for couple of days.  She saw her ophthalmologist the day prior and was found to have 20/25 vision in both eyes but right homonymous hemianopsia on exam.  MRI scan of the brain was obtained which showed left medial occipital infarct.  CT angiogram of the head and neck showed left posterior cerebral artery occlusion in the P3 segment with widespread atherosclerosis involving the head and the neck with severe left subclavian stenosis, severe right ICA bulb stenosis 70 to 80%.  Left vertebral artery occlusion, left ICA bulb 60% stenosis and moderate left ICA siphon stenosis with diminutive basilar artery.  Carotid ultrasound showed 50 to 79% right ICA and 40 to 59% left ICA stenosis.  2D echo showed no cardiac source of embolism.  TEE was negative for PFO or vegetations.  LDL cholesterol 99 mg percent.  Hemoglobin A1c was 8.6.  Patient had loop recorder inserted and so for paroxysmal A. fib has not yet been found.  She was discharged on aspirin and Plavix and states she is tolerating it well without bruising or bleeding.  She has noticed improvement in her peripheral vision and now she is able to see much more.  She has not been driving a  lot but only when she has to and she has to be careful.  She has a follow-up appointment with her eye doctor to check her peripheral field of vision.  She states her short-term memory has improved and her gait and balance have improved and she is able to ambulate independently without assistance.  She does have tingling numbness in the feet from diabetic neuropathy which is longstanding.  She does take gabapentin 300 mg 4 times daily which is not very effective.  She has longstanding history of migraine headaches but of late has noticed increased headache frequency.  She has been taking Topamax currently 50 mg twice daily for the last 4 to 6 weeks without much benefit.  She takes about 8 to 10 tablets of Tylenol on a daily basis as well.   Update 06/27/2019 JM: Pamela Carney is a 54 year old female who is being seen today, 06/27/2019, for stroke follow-up.  She has been overall stable from a stroke standpoint but does endorse continued mild left hand weakness, visual impairment and increased difficulty with imbalance.  She has had multiple ED admissions with most recently 06/20/2019-06/25/2019 with finding of AKI on CKD stage IIIa with azotemia/possible uremia with AKI likely prerenal in setting of dehydration due to diarrhea and nausea, UTI and lactic acidosis/anion gap metabolic acidosis.  Referral was sent to neuro rehab PT for hopeful benefit with strengthening and increased endurance.  Initial evaluation has not been scheduled at this time.  She continues to feel generalized weakness, greater difficulty with ambulation and "out of it" but was just discharged from hospitalization 2 days prior.  Continues on Topamax 100 mg twice daily with excellent benefit of migraine control.  She does report recently running out of Topamax and did experience worsening of headaches.  Continues on clopidogrel and atorvastatin for secondary stroke prevention of side effects.  Blood pressure today 102/64.  Glucose levels stable.   Continues to follow with PCP regularly for HTN, HLD and DM management.  She does have follow-up with vascular surgery on 07/21/2019 for surveillance monitoring of known carotid stenosis.  Loop recorder is not shown atrial fibrillation thus far.  No further concerns at this time.    ROS:   14 system review of systems is positive for vision loss, tingling, numbness, imbalance and all other systems negative  PMH:  Past Medical History:  Diagnosis Date  . Abnormal Pap smear   . AKI (acute kidney injury) ([place]) 12/28/2017  . Anxiety   . Arthritis    bil knees, neck  . Bipolar 1 disorder ([place])   . Cataract    Mild  . Cholelithiasis 12/23/2017   noted on CT renal, pt unaware  . Chronic kidney disease (CKD), stage III (moderate)   . Cystitis   . Depression   . Diabetes mellitus without complication ([place])    type 2  . Genital warts    Hx of genital  . GERD (gastroesophageal reflux disease)   . Heart murmur    childhood  . Hematuria   . History of blood transfusion   . History of ectopic pregnancy   . History of gestational diabetes   . History of kidney stones   . History of sepsis    after ectopic pregnancy  . Hyperlipidemia   . Hypertension   . Idiopathic peripheral neuropathy    both feet  . Leg ulcer, left ([place])   . Low back pain   . Migraines   . Obesity   . Oral candidiasis   . Pneumonia   . PONV (postoperative nausea and vomiting)    prolonged sedation  . Recurrent UTI   . 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 Sigmoid diverticulosis 12/23/2017   noted on CT renal, pt unaware  . Sleep apnea    uses cpap  . Stroke [facility])    Cryptogenic  . Ulcer of foot ([place])    Left  . Weakness     Social History:  Social History   Socioeconomic History  . Marital status: Married    Spouse name: Not on file  . Number of children: Not on file  . Years of education: Not on file  .  Highest education level: Not on file  Occupational History  . Not on file  Tobacco Use  . Smoking status: Current Every Day Smoker    Packs/day: 1.00    Years: 37.00    Pack years: 37.00    Types: Cigarettes  . Smokeless tobacco: Never Used  Substance and Sexual Activity  . Alcohol use: No    Alcohol/week: 0.0 standard drinks  . Drug use: No  . Sexual activity: Not Currently    Birth control/protection: I.U.D.    Comment: Mirena-1st intercourse 54 yo-More than 5 partners  Other Topics Concern  . Not on file  Social History Narrative  . Not on file   Social Determinants  of Health   Financial Resource Strain:   . Difficulty of Paying Living Expenses:   Food Insecurity:   . Worried About Charity fundraiser in the Last Year:   . Arboriculturist in the Last Year:   Transportation Needs:   . Film/video editor (Medical):   Marland Kitchen Lack of Transportation (Non-Medical):   Physical Activity:   . Days of Exercise per Week:   . Minutes of Exercise per Session:   Stress:   . Feeling of Stress :   Social Connections:   . Frequency of Communication with Friends and Family:   . Frequency of Social Gatherings with Friends and Family:   . Attends Religious Services:   . Active Member of Clubs or Organizations:   . Attends Archivist Meetings:   Marland Kitchen Marital Status:   Intimate Partner Violence:   . Fear of Current or Ex-Partner:   . Emotionally Abused:   Marland Kitchen Physically Abused:   . Sexually Abused:     Medications:   Current Outpatient Medications on File Prior to Visit  Medication Sig Dispense Refill  . atorvastatin (LIPITOR) 80 MG tablet Take 1 tablet (80 mg total) by mouth daily at 6 PM. 90 tablet 2  . AZO CRANBERRY GUMMIES PO Take 2 tablets by mouth daily.    . Blood Glucose Monitoring Suppl (ONETOUCH VERIO) w/Device KIT Use to check blood sugars every morning fasting and 2 hours after largest meal 1 kit 0  . cetirizine (ZYRTEC) 10 MG tablet Take 1 tablet (10 mg total)  by mouth daily. 90 tablet 2  . clopidogrel (PLAVIX) 75 MG tablet Take 1 tablet (75 mg total) by mouth daily. 90 tablet 2  . DULoxetine (CYMBALTA) 60 MG capsule Take 1 capsule (60 mg total) by mouth daily. 90 capsule 0  . fluticasone (FLONASE) 50 MCG/ACT nasal spray Place 1 spray into both nostrils daily. 16 g 2  . glucose blood (ONETOUCH VERIO) test strip Use to check blood sugars every morning fasting and 2 hours after largest meal 200 each 3  . indapamide (LOZOL) 2.5 MG tablet Take 2.5 mg by mouth every evening.    . Insulin Glargine (LANTUS) 100 UNIT/ML Solostar Pen Inject 15 Units into the skin at bedtime. 15 mL prn  . Insulin Pen Needle (PEN NEEDLES) 31G X 8 MM MISC 1 each by Does not apply route daily. 100 each 3  . metFORMIN (GLUCOPHAGE) 500 MG tablet Take 2 tablets (1,000 mg total) by mouth 2 (two) times daily with a meal. 360 tablet 0  . Multiple Vitamins-Minerals (ONE-A-DAY WOMENS 50+ ADVANTAGE) TABS Take 1 tablet by mouth daily.    Marland Kitchen nystatin cream (MYCOSTATIN) Apply 1 application topically 2 (two) times daily. (Patient taking differently: Apply 1 application topically 2 (two) times daily as needed for dry skin. ) 60 g 1  . ondansetron (ZOFRAN) 4 MG tablet Take 1 tablet (4 mg total) by mouth every 8 (eight) hours as needed for nausea or vomiting. 20 tablet 1  . ONE TOUCH LANCETS MISC Use to check blood sugars every morning fasting and 2 hours after largest meal 200 each 3  . topiramate (TOPAMAX) 100 MG tablet Take 1 tablet (100 mg total) by mouth 2 (two) times daily. 60 tablet 2   No current facility-administered medications on file prior to visit.    Allergies:   Allergies  Allergen Reactions  . Sulfa Antibiotics Hives and Swelling    Swelling site not recalled  .  Latex Rash  . Tape Rash    Not tolerated well    Physical Exam  Today's Vitals   06/27/19 0749  BP: 102/64  Pulse: 62  Temp: (!) 97.2 F (36.2 C)  Weight: 206 lb (93.4 kg)  Height: _0  (1.575 m)   Body  mass index is 37.68 kg/m.   General: Obese flat affect middle-aged Caucasian lady, seated, in no evident distress Head: head normocephalic and atraumatic.  Neck: supple with no carotid or supraclavicular bruits Cardiovascular: regular rate and rhythm, no murmurs Musculoskeletal: no deformity Skin:  no rash/petichiae Vascular:  Normal pulses all extremities  Neurologic Exam Mental Status: Awake and fully alert.  Normal speech and language.  Oriented to place and time.  Attention span, concentration and fund of knowledge appropriate. Mood and affect appropriate.  Cranial Nerves: Pupils equal, briskly reactive to light. Extraocular movements full without nystagmus.  Unable to appreciate visual field deficits to confrontation -subjectively decreased right periphery.  Hearing intact. Facial sensation intact. Face, tongue, palate moves normally and symmetrically.  Motor: Normal bulk and tone. Normal strength in all tested extremity muscles except possible left hip flexor weakness with questionable giveaway weakness and slightly decreased left hand dexterity -patient reports stable since prior stroke. Sensory.: intact to touch ,pinprick .but diminished position and vibratory sensation bilateral toes Coordination: Rapid alternating movements normal in all extremities except slightly decreased left hand dexterity. Finger-to-nose and heel-to-shin performed accurately bilaterally. Gait and Station: Arises from chair with mild difficulty. Stance is normal. Gait demonstrates broad-based stance and stiffness initially upon standing.  No evidence of imbalance or use of assistive device Reflexes: 1+ and symmetric except ankle jerks are depressed.. Toes downgoing.       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  Multiple vascular risk  factors of intracranial atherosclerosis, obesity, diabetes with neuropathy, migraines, hypertension, hyperlipidemia and smoking.  Residual deficits subjective right peripheral vision loss but unable to appreciate on confrontation as well as decreased left hand dexterity with patient reporting chronic.  Migraines have been well controlled with ongoing use of Topamax.  She has had multiple ED admissions for UTIs, metabolic encephalopathy and AKI on CKD.  Recently discharged on 06/25/2019 with AKI on CKD with azotemia/possible uremia in setting of dehydration, lactic acidosis/anion gap metabolic acidosis and UTI.  Since discharge, endorses worsening imbalance, "brain fog" and fatigue.    PLAN:  Stroke history:   -Continuation of Plavix and atorvastatin for secondary stroke prevention  -Continue to follow with PCP for HTN, HLD and DM management  -Appears improvement of residual visual impairment per today's exam.  Advised to  continue follow-up with ophthalmology with visit scheduled 10/10/2019  Worsening imbalance, left-sided symptoms and generalized weakness:  -Likely multifactorial with recent hospitalizations and generalized deconditioning.   Encouraged patient to stop into neuro rehab after today's visit to schedule initial evaluation.  -left-sided symptoms: patient reports chronic and present since prior stroke and denies  worsening.  No prior documentation of left-sided symptoms but recent imaging  unremarkable.  Will hold off on any additional work-up at this time as current treatment  would likely not change.  Advise any worsening or new onset stroke symptoms, to call 911  immediately for further evaluation  Carotid stenosis:  -Follow-up with vascular surgery on 07/21/2019 for surveillance monitoring and ongoing  management  -Continuation of Plavix and statin along with  importance of aggressive stroke risk factor  modification and management  Migraines:  -Continue Topamax 100 mg twice daily for  migraine prophylaxis -refill provided  -Complaints of general fatigue and "brain fog" likely due to recent admission but advised if  symptoms do not improve, further discussion at that time regarding reducing dosage of  Topamax due to possible side effects.  -Advised limited use of acetaminophen due to concern of rebound headache.  -Avoidance of ibuprofen due to increased bleeding risk and kidney disease   Follow-up in 6 months or call earlier if needed   I spent 40 minutes of face-to-face and non-face-to-face time with patient.  This included previsit chart review, lab review, study review, order entry, electronic health record documentation, patient education regarding prior stroke, carotid stenosis, recent hospitalizations, migraines and importance of stroke risk factor management and answered all questions to patient satisfaction  Today's visit initiated after completion of Sleep Smart study follow up   Frann Rider, [facility]  [facility] [address] [place], [place] [address]  Phone [phone] Fax [phone] Note: This document was prepared with digital dictation and possible smart phrase technology. Any transcriptional errors that result from this process are unintentional.

## 2019-06-27 NOTE — Telephone Encounter (Signed)
Transition Care Management Follow-up Telephone Call Attempt # 2   Date of discharge and from where: 06/24/2019, [facility].   Call placed to patient # [phone], message left with call back requested to this CM.   Patient has scheduled hospital follow up appointment with PCP - 07/05/2019 @ 1030

## 2019-06-27 NOTE — Progress Notes (Signed)
I agree with the above plan 

## 2019-06-27 NOTE — Telephone Encounter (Signed)
Transition Care Management Follow-up Telephone Call  Date of discharge and from where: 06/24/2019, [facility]   How have you been since you were released from the hospital?she said that she is " feeling fine" except for diarrhea and itching.  She explained that she finished the antibiotic on Sunday 06/25/2019 and has experienced diarrhea for the past 2 days. In addition, she said her back and scalp have been itching and driving her crazy. No rash, no difficulty breathing, no other symptoms reported.  She said she has never experienced this before.  Informed her that her PCP would be notified. She has not applied any cream/lotion or taken medication for the itching.    Any questions or concerns? noted above  Items Reviewed:  Did the pt receive and understand the discharge instructions provided?  yes  Medications obtained and verified? she said that she has all medications and is taking them as ordered, no questions at this time.  She said noting has changed but she is aware of the new cymbalta order.she is requesting a new order for zofran as she ran out during this recent illness.     Do you have support at home?  lives with 69yo son.   Other (ie: DME, Home Health, etc)  Has glucometer. She said her blood sugars have been running 110-125 since her metformin was increased.   3:1 commode ordered at discharge.   Was referred to outpatient neurorehab for PT.  Functional Questionnaire: (I = Independent and D = Dependent) ADL's:independent   Follow up appointments reviewed:    PCP Hospital f/u appt confirmed?Marland Kitchenappointment with Geryl Rankins, NP 07/05/2019 @ [facility] f/u appt confirmed? saw neurology today. [facility] - 07/21/2019  Are transportation arrangements needed? no , she said she has transportation  If their condition worsens, is the pt aware to call  their PCP or go to the ED?} yes  Was the patient provided with contact information for the PCP's office or ED? {  she has the phone number for the clinic  Was the pt encouraged to call back with questions or concerns?  yes

## 2019-06-27 NOTE — Patient Instructions (Signed)
Restart topamax 100mg  twice daily - will send in a prescription   Schedule appt with physical therapy  Continue clopidogrel 75 mg daily  and lipitor  for secondary stroke prevention  Continue to follow up with PCP regarding cholesterol, blood pressure and diabetes management   Continue to monitor blood pressure at home  Maintain strict control of hypertension with blood pressure goal below 130/90, diabetes with hemoglobin A1c goal below 6.5% and cholesterol with LDL cholesterol (bad cholesterol) goal below 70 mg/dL. I also advised the patient to eat a healthy diet with plenty of whole grains, cereals, fruits and vegetables, exercise regularly and maintain ideal body weight.  Followup in the future with me in 6 months or call earlier if needed       Thank you for coming to see [place] at [facility]. I hope we have been able to provide you high quality care today.  You may receive a patient satisfaction survey over the next few weeks. We would appreciate your feedback and comments so that we may continue to improve ourselves and the health of our patients.

## 2019-07-05 ENCOUNTER — Other Ambulatory Visit: Payer: Self-pay

## 2019-07-05 ENCOUNTER — Encounter: Payer: Self-pay | Admitting: Nurse Practitioner

## 2019-07-05 ENCOUNTER — Ambulatory Visit: Payer: 59 | Attending: Nurse Practitioner | Admitting: Nurse Practitioner

## 2019-07-05 DIAGNOSIS — R197 Diarrhea, unspecified: Secondary | ICD-10-CM

## 2019-07-05 DIAGNOSIS — N1831 Chronic kidney disease, stage 3a: Secondary | ICD-10-CM | POA: Diagnosis not present

## 2019-07-05 NOTE — Progress Notes (Signed)
Virtual Visit via Telephone Note Due to national recommendations of social distancing due to [place] 19, telehealth visit is felt to be most appropriate for this patient at this time.  I discussed the limitations, risks, security and privacy concerns of performing an evaluation and management service by telephone and the availability of in person appointments. I also discussed with the patient that there may be a patient responsible charge related to this service. The patient expressed understanding and agreed to proceed.    I connected with Pamela Carney on 07/05/19  at  10:30 AM EDT  EDT by telephone and verified that I am speaking with the correct person using two identifiers.   Consent I discussed the limitations, risks, security and privacy concerns of performing an evaluation and management service by telephone and the availability of in person appointments. I also discussed with the patient that there may be a patient responsible charge related to this service. The patient expressed understanding and agreed to proceed.   Location of Patient: Private Residence    Location of Provider: [place] participating in Telemedicine visit: Geryl Rankins FNP-BC [place]    History of Present Illness: Telemedicine visit for: HFU  Admitted on 06-20-2019 with UTI and ARF with azotemia. Treated with IVFs and abx. Discharged home in stable condition.  Today she denies nausea, vomiting. She endorses diarrhea for over a week now. Also endorses increased pruritis on back and chest.  She was discharged home on keflex on 06-24-2019 for 2 days. Will need to check stool for pathogens.     Past Medical History:  Diagnosis Date  . Abnormal Pap smear   . AKI (acute kidney injury) ([place]) 12/28/2017  . Anxiety   . Arthritis    bil knees, neck  . Bipolar 1 disorder ([place])   . Cataract    Mild  . Cholelithiasis 12/23/2017   noted on CT  renal, pt unaware  . Chronic kidney disease (CKD), stage III (moderate)   . Cystitis   . Depression   . Diabetes mellitus without complication ([place])    type 2  . Genital warts    Hx of genital  . GERD (gastroesophageal reflux disease)   . Heart murmur    childhood  . Hematuria   . History of blood transfusion   . History of ectopic pregnancy   . History of gestational diabetes   . History of kidney stones   . History of sepsis    after ectopic pregnancy  . Hyperlipidemia   . Hypertension   . Idiopathic peripheral neuropathy    both feet  . Leg ulcer, left ([place])   . Low back pain   . Migraines   . Obesity   . Oral candidiasis   . Pneumonia   . PONV (postoperative nausea and vomiting)    prolonged sedation  . Recurrent UTI   . 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 Sigmoid diverticulosis 12/23/2017   noted on CT renal, pt unaware  . Sleep apnea    uses cpap  . 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 Ulcer of foot ([place])    Left  . Weakness     Past Surgical History:  Procedure Laterality Date  . BUBBLE STUDY  12/23/2018   Procedure: BUBBLE STUDY;  Surgeon: Lelon Perla, MD;  Location: [facility]  ENDOSCOPY;  Service: Cardiovascular;;  . CERVICAL CONE BIOPSY    . CESAREAN SECTION     x3  . CYSTOSCOPY W/ URETERAL STENT PLACEMENT Right 12/15/2018   Procedure: Cystoscopy, right retrograde ureteropyelogram, fluoroscopic interpretation, right double-J stent placement (24 cm x 6 [place]);  Surgeon: Franchot Gallo, MD;  Location: [place];  Service: Urology;  Laterality: Right;  . CYSTOSCOPY WITH RETROGRADE PYELOGRAM, URETEROSCOPY AND STENT PLACEMENT Right 01/27/2019   Procedure: CYSTOSCOPY WITH RETROGRADE PYELOGRAM, URETEROSCOPY AND STENT PLACEMENT; BASKET STONE RETRIEVAL;  Surgeon: Alexis Frock, MD;  [facility];  Service: Urology;  Laterality: Right;  75 MINS  .  CYSTOSCOPY/URETEROSCOPY/HOLMIUM LASER/STENT PLACEMENT Right 02/04/2018   Procedure: CYSTOSCOPY/URETEROSCOPY/RETROGRADE PYELOGRAM/HOLMIUM LASER/STENT PLACEMENT;  Surgeon: Alexis Frock, MD;  [facility];  Service: Urology;  Laterality: Right;  . CYSTOSCOPY/URETEROSCOPY/HOLMIUM LASER/STENT PLACEMENT Right 02/07/2018   Procedure: CYSTOSCOPY LEFT STENT EXCHANGE;  Surgeon: Alexis Frock, MD;  [facility];  Service: Urology;  Laterality: Right;  . DILATION AND CURETTAGE OF UTERUS    . ECTOPIC PREGNANCY SURGERY    . IR NEPHROSTOMY PLACEMENT LEFT  12/25/2017  . LOOP RECORDER INSERTION N/A 12/23/2018   Procedure: LOOP RECORDER INSERTION;  Surgeon: Constance Haw, MD;  Location: [place] CV LAB;  Service: Cardiovascular;  Laterality: N/A;  . NEPHROLITHOTOMY Left 02/04/2018   Procedure: NEPHROLITHOTOMY PERCUTANEOUS;  Surgeon: Alexis Frock, MD;  [facility];  Service: Urology;  Laterality: Left;  3 HRS  . NEPHROLITHOTOMY Left 02/07/2018   Procedure: SECOND LOOK NEPHROLITHOTOMY PERCUTANEOUS;  Surgeon: Alexis Frock, MD;  [facility];  Service: Urology;  Laterality: Left;  2 HRS  . OVARIAN CYST REMOVAL    . TEE WITHOUT CARDIOVERSION N/A 12/23/2018   Procedure: TRANSESOPHAGEAL ECHOCARDIOGRAM (TEE);  Surgeon: Lelon Perla, MD;  Location: [facility] ENDOSCOPY;  Service: Cardiovascular;  Laterality: N/A;  . UNILATERAL SALPINGECTOMY     Pt unsure of which fallopian tube was removed  . WISDOM TOOTH EXTRACTION      Family History  Problem Relation Age of Onset  . Diabetes Paternal Grandfather   . COPD Paternal Grandfather   . Heart disease Paternal Grandfather   . Cancer Paternal Grandfather        bone  . Cancer Paternal Grandmother   . Heart disease Paternal Grandmother   . Cancer Father   . Hypertension Father   . Heart attack Father   . Alcohol abuse Father   . Depression Father   . Heart failure Mother   . Diabetes Mother   . Heart disease Mother   .  Hyperlipidemia Mother   . Hypertension Mother   . Heart attack Mother   . Peripheral vascular disease Mother   . Depression Mother   . COPD Mother   . Hypertension Sister   . Heart attack Sister   . Stroke Maternal Grandmother   . Colon cancer Neg Hx   . Colon polyps Neg Hx   . Esophageal cancer Neg Hx   . Rectal cancer Neg Hx   . Stomach cancer Neg Hx     Social History   Socioeconomic History  . Marital status: Married    Spouse name: Not on file  . Number of children: Not on file  . Years of education: Not on file  . Highest education level: Not on file  Occupational History  . Not on file  Tobacco Use  . Smoking status: Current Every Day Smoker    Packs/day: 1.00    Years: 37.00  Pack years: 37.00    Types: Cigarettes  . Smokeless tobacco: Never Used  Substance and Sexual Activity  . Alcohol use: No    Alcohol/week: 0.0 standard drinks  . Drug use: No  . Sexual activity: Not Currently    Birth control/protection: I.U.D.    Comment: Mirena-1st intercourse 54 yo-More than 5 partners  Other Topics Concern  . Not on file  Social History Narrative  . Not on file   Social Determinants of Health   Financial Resource Strain:   . Difficulty of Paying Living Expenses:   Food Insecurity:   . Worried About Charity fundraiser in the Last Year:   . Arboriculturist in the Last Year:   Transportation Needs:   . Film/video editor (Medical):   Marland Kitchen Lack of Transportation (Non-Medical):   Physical Activity:   . Days of Exercise per Week:   . Minutes of Exercise per Session:   Stress:   . Feeling of Stress :   Social Connections:   . Frequency of Communication with Friends and Family:   . Frequency of Social Gatherings with Friends and Family:   . Attends Religious Services:   . Active Member of Clubs or Organizations:   . Attends Archivist Meetings:   Marland Kitchen Marital Status:      Observations/Objective: Awake, alert and oriented x 3   Review of  Systems  Constitutional: Negative for fever, malaise/fatigue and weight loss.  HENT: Negative.  Negative for nosebleeds.   Eyes: Negative.  Negative for blurred vision, double vision and photophobia.  Respiratory: Negative.  Negative for cough and shortness of breath.   Cardiovascular: Negative.  Negative for chest pain, palpitations and leg swelling.  Gastrointestinal: Negative.  Negative for heartburn, nausea and vomiting.  Musculoskeletal: Negative.  Negative for myalgias.  Neurological: Negative.  Negative for dizziness, focal weakness, seizures and headaches.  Psychiatric/Behavioral: Negative.  Negative for suicidal ideas.    Assessment and Plan: There are no diagnoses linked to this encounter.   Follow Up Instructions No follow-ups on file.     I discussed the assessment and treatment plan with the patient. The patient was provided an opportunity to ask questions and all were answered. The patient agreed with the plan and demonstrated an understanding of the instructions.   The patient was advised to call back or seek an in-person evaluation if the symptoms worsen or if the condition fails to improve as anticipated.  I provided 17 minutes of non-face-to-face time during this encounter including median intraservice time, reviewing previous notes, labs, imaging, medications and explaining diagnosis and management.  [name], FNP-BC

## 2019-07-09 LAB — CUP PACEART REMOTE DEVICE CHECK
Date Time Interrogation Session: 20210417230340
Implantable Pulse Generator Implant Date: 20201002

## 2019-07-10 ENCOUNTER — Ambulatory Visit (INDEPENDENT_AMBULATORY_CARE_PROVIDER_SITE_OTHER): Payer: 59 | Admitting: *Deleted

## 2019-07-10 DIAGNOSIS — I6349 Cerebral infarction due to embolism of other cerebral artery: Secondary | ICD-10-CM

## 2019-07-11 NOTE — Progress Notes (Signed)
ILR Remote 

## 2019-07-19 ENCOUNTER — Other Ambulatory Visit: Payer: Self-pay | Admitting: *Deleted

## 2019-07-19 DIAGNOSIS — I6523 Occlusion and stenosis of bilateral carotid arteries: Secondary | ICD-10-CM

## 2019-07-21 ENCOUNTER — Inpatient Hospital Stay (HOSPITAL_COMMUNITY): Admission: RE | Admit: 2019-07-21 | Payer: 59 | Source: Ambulatory Visit

## 2019-07-21 ENCOUNTER — Ambulatory Visit: Payer: 59

## 2019-08-08 DIAGNOSIS — Z0289 Encounter for other administrative examinations: Secondary | ICD-10-CM

## 2019-08-09 LAB — CUP PACEART REMOTE DEVICE CHECK
Date Time Interrogation Session: 20210518230607
Implantable Pulse Generator Implant Date: 20201002

## 2019-08-14 ENCOUNTER — Ambulatory Visit (INDEPENDENT_AMBULATORY_CARE_PROVIDER_SITE_OTHER): Payer: 59 | Admitting: *Deleted

## 2019-08-14 ENCOUNTER — Encounter: Payer: Self-pay | Admitting: Surgery

## 2019-08-14 DIAGNOSIS — I6349 Cerebral infarction due to embolism of other cerebral artery: Secondary | ICD-10-CM | POA: Diagnosis not present

## 2019-08-15 NOTE — Progress Notes (Signed)
Carelink Summary Report / Loop Recorder 

## 2019-08-28 ENCOUNTER — Other Ambulatory Visit: Payer: Self-pay

## 2019-08-28 ENCOUNTER — Ambulatory Visit: Payer: 59 | Attending: Nurse Practitioner | Admitting: Nurse Practitioner

## 2019-08-28 ENCOUNTER — Encounter: Payer: Self-pay | Admitting: Nurse Practitioner

## 2019-08-28 VITALS — BP 93/67 | HR 97 | Temp 97.7°F | Ht 62.0 in | Wt 204.0 lb

## 2019-08-28 DIAGNOSIS — N1831 Chronic kidney disease, stage 3a: Secondary | ICD-10-CM

## 2019-08-28 DIAGNOSIS — E1165 Type 2 diabetes mellitus with hyperglycemia: Secondary | ICD-10-CM

## 2019-08-28 DIAGNOSIS — F419 Anxiety disorder, unspecified: Secondary | ICD-10-CM | POA: Diagnosis not present

## 2019-08-28 DIAGNOSIS — Z1159 Encounter for screening for other viral diseases: Secondary | ICD-10-CM

## 2019-08-28 DIAGNOSIS — Z794 Long term (current) use of insulin: Secondary | ICD-10-CM

## 2019-08-28 DIAGNOSIS — Z130 Encounter for screening for diseases of the blood and blood-forming organs and certain disorders involving the immune mechanism: Secondary | ICD-10-CM

## 2019-08-28 DIAGNOSIS — IMO0002 Reserved for concepts with insufficient information to code with codable children: Secondary | ICD-10-CM

## 2019-08-28 DIAGNOSIS — F32A Depression, unspecified: Secondary | ICD-10-CM

## 2019-08-28 DIAGNOSIS — F329 Major depressive disorder, single episode, unspecified: Secondary | ICD-10-CM

## 2019-08-28 LAB — POCT GLYCOSYLATED HEMOGLOBIN (HGB A1C): Hemoglobin A1C: 6.4 % — AB (ref 4.0–5.6)

## 2019-08-28 LAB — GLUCOSE, POCT (MANUAL RESULT ENTRY): POC Glucose: 146 mg/dl — AB (ref 70–99)

## 2019-08-28 MED ORDER — INSULIN GLARGINE 100 UNIT/ML SOLOSTAR PEN: 15.0000 [IU] | PEN_INJECTOR | Freq: Every day | SUBCUTANEOUS | 99 refills | Status: AC

## 2019-08-28 MED ORDER — PEN NEEDLES 31G X 8 MM MISC: 3 refills | Status: AC

## 2019-08-28 NOTE — Patient Instructions (Signed)
Increase lantus by 2 units every 3 days if blood sugar greater than 130.

## 2019-08-28 NOTE — Progress Notes (Signed)
Assessment & Plan:  Pamela Carney was seen today for follow-up.  Diagnoses and all orders for this visit:  Uncontrolled type 2 diabetes mellitus, with long-term current use of insulin (HCC) -     Glucose (CBG) -     HgB A1c -     Ambulatory referral to Podiatry -     insulin glargine (LANTUS) 100 UNIT/ML Solostar Pen; Inject 15 Units into the skin at bedtime. -     Insulin Pen Needle (PEN NEEDLES) 31G X 8 MM MISC; Use as instructed. Inject into the skin once nightly. Continue blood sugar control as discussed in office today, low carbohydrate diet, and regular physical exercise as tolerated, 150 minutes per week (30 min each day, 5 days per week, or 50 min 3 days per week). Keep blood sugar logs with fasting goal of 90-130 mg/dl, post prandial (after you eat) less than 180.  For Hypoglycemia: BS <60 and Hyperglycemia BS >400; contact the clinic ASAP. Annual eye exams and foot exams are recommended.   Anxiety and depression -     Ambulatory referral to [place]  Need for hepatitis C screening test -     Hepatitis C Antibody  Screening for deficiency anemia -     CBC  Stage 3a chronic kidney disease -     CMP14+EGFR    Patient has been counseled on age-appropriate routine health concerns for screening and prevention. These are reviewed and up-to-date. Referrals have been placed accordingly. Immunizations are up-to-date or declined.    Subjective:   Chief Complaint  Patient presents with  . Follow-up    Pt. is here for diabetes follow up.    HPI Pamela Carney 54 y.o. female presents to office today for follow up. Seeing Neurology as well for stroke (left posterior cerebral artery infarct in September 2020 secondary to cryptogenic etiology with placement of loop recorder which has not shown afib) She was instructed to follow up with Neuro rehab for scheduling due to worsening imbalance and generalized weakness.    DM TYPE 2 Diabetes well controlled. LDL not  at goal. Endorses adherence taking atorvastatin 80 mg daily.  Postprandial Readings 130-140s. She has been experiencing diarrhea since she was admitted to the hospital for acute renal failure. I ordered a stool study several months ago however she has not picked it up. I will be discontinuing Metformin to see if this helps with the loose stools. She denies any blood in stools. Occurring 4-5 times per day. A1c has improved. At this time she will continue on lantus 15 units only with instructions to increase her lantus by 2 units every 3 days for fasting blood glucose readings greater than 130. She denies any hypo or hyperglycemic symptoms.  Lab Results  Component Value Date   HGBA1C 6.4 (A) 08/28/2019   Lab Results  Component Value Date   HGBA1C 7.4 (H) 04/28/2019   Lab Results  Component Value Date   LDLCALC 99 12/15/2018    Anxiety and Depression Has not been taking her cymbalta. Feels suicidal some days but not presently. Thoughts go away after a few seconds but sometimes she tells herself "Just fuck it".  Not sleeping well. Recommended melatonin up to 7 mg nightly.    Migraines Neurology decreased her topamax to 50 mg. Sates she was feeling "fuzzy headed" before but now feels much better since decreasing from 100 to 50 mg. States she has a tendency to fall when she feels fuzzy headed.   Review  of Systems  Constitutional: Negative for fever, malaise/fatigue and weight loss.  HENT: Negative.  Negative for nosebleeds.   Eyes: Negative.  Negative for blurred vision, double vision and photophobia.  Respiratory: Negative.  Negative for cough and shortness of breath.   Cardiovascular: Negative.  Negative for chest pain, palpitations and leg swelling.  Gastrointestinal: Positive for diarrhea. Negative for heartburn, nausea and vomiting.  Musculoskeletal: Negative.  Negative for myalgias.  Neurological: Negative.  Negative for dizziness, focal weakness, seizures, weakness and headaches.    Psychiatric/Behavioral: Positive for depression and suicidal ideas. Substance abuse: ruminating. The patient is nervous/anxious and has insomnia.     Past Medical History:  Diagnosis Date  . Abnormal Pap smear   . AKI (acute kidney injury) ([place]) 12/28/2017  . Anxiety   . Arthritis    bil knees, neck  . Bipolar 1 disorder ([place])   . Cataract    Mild  . Cholelithiasis 12/23/2017   noted on CT renal, pt unaware  . Chronic kidney disease (CKD), stage III (moderate)   . Cystitis   . Depression   . Diabetes mellitus without complication ([place])    type 2  . Genital warts    Hx of genital  . GERD (gastroesophageal reflux disease)   . Heart murmur    childhood  . Hematuria   . History of blood transfusion   . History of ectopic pregnancy   . History of gestational diabetes   . History of kidney stones   . History of sepsis    after ectopic pregnancy  . Hyperlipidemia   . Hypertension   . Idiopathic peripheral neuropathy    both feet  . Leg ulcer, left ([place])   . Low back pain   . Migraines   . Obesity   . Oral candidiasis   . Pneumonia   . PONV (postoperative nausea and vomiting)    prolonged sedation  . Recurrent UTI   . 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 Sigmoid diverticulosis 12/23/2017   noted on CT renal, pt unaware  . Sleep apnea    uses cpap  . 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 Ulcer of foot ([place])    Left  . Weakness     Past Surgical History:  Procedure Laterality Date  . BUBBLE STUDY  12/23/2018   Procedure: BUBBLE STUDY;  Surgeon: Lelon Perla, MD;  Location[facility] ENDOSCOPY;  Service: Cardiovascular;;  . CERVICAL CONE BIOPSY    . CESAREAN SECTION     x3  . CYSTOSCOPY W/ URETERAL STENT PLACEMENT Right 12/15/2018   Procedure: Cystoscopy, right retrograde ureteropyelogram, fluoroscopic interpretation, right double-J stent placement (24 cm x 6 [place]);  Surgeon:  Franchot Gallo, MD;  Location: [place];  Service: Urology;  Laterality: Right;  . CYSTOSCOPY WITH RETROGRADE PYELOGRAM, URETEROSCOPY AND STENT PLACEMENT Right 01/27/2019   Procedure: CYSTOSCOPY WITH RETROGRADE PYELOGRAM, URETEROSCOPY AND STENT PLACEMENT; BASKET STONE RETRIEVAL;  Surgeon: Alexis Frock, MD;  [facility];  Service: Urology;  Laterality: Right;  75 MINS  . CYSTOSCOPY/URETEROSCOPY/HOLMIUM LASER/STENT PLACEMENT Right 02/04/2018   Procedure: CYSTOSCOPY/URETEROSCOPY/RETROGRADE PYELOGRAM/HOLMIUM LASER/STENT PLACEMENT;  Surgeon: Alexis Frock, MD;  [facility];  Service: Urology;  Laterality: Right;  . CYSTOSCOPY/URETEROSCOPY/HOLMIUM LASER/STENT PLACEMENT Right 02/07/2018   Procedure: CYSTOSCOPY LEFT STENT EXCHANGE;  Surgeon: Alexis Frock, MD;  [facility];  Service: Urology;  Laterality: Right;  . DILATION AND CURETTAGE OF UTERUS    .  ECTOPIC PREGNANCY SURGERY    . IR NEPHROSTOMY PLACEMENT LEFT  12/25/2017  . LOOP RECORDER INSERTION N/A 12/23/2018   Procedure: LOOP RECORDER INSERTION;  Surgeon: Constance Haw, MD;  Location: [place] CV LAB;  Service: Cardiovascular;  Laterality: N/A;  . NEPHROLITHOTOMY Left 02/04/2018   Procedure: NEPHROLITHOTOMY PERCUTANEOUS;  Surgeon: Alexis Frock, MD;  [facility];  Service: Urology;  Laterality: Left;  3 HRS  . NEPHROLITHOTOMY Left 02/07/2018   Procedure: SECOND LOOK NEPHROLITHOTOMY PERCUTANEOUS;  Surgeon: Alexis Frock, MD;  [facility];  Service: Urology;  Laterality: Left;  2 HRS  . OVARIAN CYST REMOVAL    . TEE WITHOUT CARDIOVERSION N/A 12/23/2018   Procedure: TRANSESOPHAGEAL ECHOCARDIOGRAM (TEE);  Surgeon: Lelon Perla, MD;  Location: [facility] ENDOSCOPY;  Service: Cardiovascular;  Laterality: N/A;  . UNILATERAL SALPINGECTOMY     Pt unsure of which fallopian tube was removed  . WISDOM TOOTH EXTRACTION      Family History  Problem Relation Age of Onset  . Diabetes Paternal Grandfather   . COPD  Paternal Grandfather   . Heart disease Paternal Grandfather   . Cancer Paternal Grandfather        bone  . Cancer Paternal Grandmother   . Heart disease Paternal Grandmother   . Cancer Father   . Hypertension Father   . Heart attack Father   . Alcohol abuse Father   . Depression Father   . Heart failure Mother   . Diabetes Mother   . Heart disease Mother   . Hyperlipidemia Mother   . Hypertension Mother   . Heart attack Mother   . Peripheral vascular disease Mother   . Depression Mother   . COPD Mother   . Hypertension Sister   . Heart attack Sister   . Stroke Maternal Grandmother   . Colon cancer Neg Hx   . Colon polyps Neg Hx   . Esophageal cancer Neg Hx   . Rectal cancer Neg Hx   . Stomach cancer Neg Hx     Social History Reviewed with no changes to be made today.   Outpatient Medications Prior to Visit  Medication Sig Dispense Refill  . atorvastatin (LIPITOR) 80 MG tablet Take 1 tablet (80 mg total) by mouth daily at 6 PM. 90 tablet 2  . AZO CRANBERRY GUMMIES PO Take 2 tablets by mouth daily.    . Blood Glucose Monitoring Suppl (ONETOUCH VERIO) w/Device KIT Use to check blood sugars every morning fasting and 2 hours after largest meal 1 kit 0  . DULoxetine (CYMBALTA) 60 MG capsule Take 1 capsule (60 mg total) by mouth daily. 90 capsule 0  . fluticasone (FLONASE) 50 MCG/ACT nasal spray Place 1 spray into both nostrils daily. 16 g 2  . glucose blood (ONETOUCH VERIO) test strip Use to check blood sugars every morning fasting and 2 hours after largest meal 200 each 3  . indapamide (LOZOL) 2.5 MG tablet Take 2.5 mg by mouth every evening.    . Multiple Vitamins-Minerals (ONE-A-DAY WOMENS 50+ ADVANTAGE) TABS Take 1 tablet by mouth daily.    . ondansetron (ZOFRAN) 4 MG tablet Take 1 tablet (4 mg total) by mouth every 8 (eight) hours as needed for nausea or vomiting. 20 tablet 1  . ONE TOUCH LANCETS MISC Use to check blood sugars every morning fasting and 2 hours after largest  meal 200 each 3  . topiramate (TOPAMAX) 100 MG tablet Take 1 tablet (100 mg total) by mouth 2 (two) times  daily. (Patient taking differently: Take 100 mg by mouth 2 (two) times daily. 1/2 twice a day per patient.) 180 tablet 3  . Insulin Glargine (LANTUS) 100 UNIT/ML Solostar Pen Inject 15 Units into the skin at bedtime. 15 mL prn  . Insulin Pen Needle (PEN NEEDLES) 31G X 8 MM MISC 1 each by Does not apply route daily. 100 each 3  . cetirizine (ZYRTEC) 10 MG tablet Take 1 tablet (10 mg total) by mouth daily. 90 tablet 2  . metFORMIN (GLUCOPHAGE) 500 MG tablet Take 2 tablets (1,000 mg total) by mouth 2 (two) times daily with a meal. 360 tablet 0  . nystatin cream (MYCOSTATIN) Apply 1 application topically 2 (two) times daily. (Patient not taking: Reported on 08/28/2019) 60 g 1   No facility-administered medications prior to visit.    Allergies  Allergen Reactions  . Sulfa Antibiotics Hives and Swelling    Swelling site not recalled  . Latex Rash  . Tape Rash    Not tolerated well       Objective:    BP 93/67 (BP Location: Left Arm, Patient Position: Sitting, Cuff Size: Normal)   Pulse 97   Temp 97.7 F (36.5 C) (Temporal)   Ht _0  (1.575 m)   Wt 204 lb (92.5 kg)   SpO2 95%   BMI 37.31 kg/m  Wt Readings from Last 3 Encounters:  08/28/19 204 lb (92.5 kg)  06/27/19 206 lb (93.4 kg)  06/26/19 208 lb (94.3 kg)    Physical Exam       Patient has been counseled extensively about nutrition and exercise as well as the importance of adherence with medications and regular follow-up. The patient was given clear instructions to go to ER or return to medical center if symptoms don't improve, worsen or new problems develop. The patient verbalized understanding.   Follow-up: Return in about 1 week (around 09/04/2019) for insomnia(melatonin) diarrhea.   [name], FNP-BC [facility] and [facility] [place], [place]   08/28/2019, 9:02 PM

## 2019-08-29 ENCOUNTER — Other Ambulatory Visit: Payer: Self-pay | Admitting: Nurse Practitioner

## 2019-08-29 DIAGNOSIS — A09 Infectious gastroenteritis and colitis, unspecified: Secondary | ICD-10-CM

## 2019-08-29 DIAGNOSIS — D72829 Elevated white blood cell count, unspecified: Secondary | ICD-10-CM

## 2019-08-29 LAB — CBC
Hematocrit: 46.2 % (ref 34.0–46.6)
Hemoglobin: 15.1 g/dL (ref 11.1–15.9)
MCH: 31.4 pg (ref 26.6–33.0)
MCHC: 32.7 g/dL (ref 31.5–35.7)
MCV: 96 fL (ref 79–97)
Platelets: 394 10*3/uL (ref 150–450)
RBC: 4.81 x10E6/uL (ref 3.77–5.28)
RDW: 14.5 % (ref 11.7–15.4)
WBC: 16.1 10*3/uL — ABNORMAL HIGH (ref 3.4–10.8)

## 2019-08-29 LAB — CMP14+EGFR
ALT: 24 IU/L (ref 0–32)
AST: 18 IU/L (ref 0–40)
Albumin/Globulin Ratio: 1.6 (ref 1.2–2.2)
Albumin: 4.6 g/dL (ref 3.8–4.9)
Alkaline Phosphatase: 124 IU/L — ABNORMAL HIGH (ref 48–121)
BUN/Creatinine Ratio: 13 (ref 9–23)
BUN: 29 mg/dL — ABNORMAL HIGH (ref 6–24)
Bilirubin Total: <0.2 mg/dL (ref 0.0–1.2)
CO2: 22 mmol/L (ref 20–29)
Calcium: 10.9 mg/dL — ABNORMAL HIGH (ref 8.7–10.2)
Chloride: 102 mmol/L (ref 96–106)
Creatinine, Ser: 2.23 mg/dL — ABNORMAL HIGH (ref 0.57–1.00)
GFR calc Af Amer: 28 mL/min/{1.73_m2} — ABNORMAL LOW (ref >59)
GFR calc non Af Amer: 25 mL/min/{1.73_m2} — ABNORMAL LOW (ref >59)
Globulin, Total: 2.9 g/dL (ref 1.5–4.5)
Glucose: 117 mg/dL — ABNORMAL HIGH (ref 65–99)
Potassium: 3.8 mmol/L (ref 3.5–5.2)
Sodium: 142 mmol/L (ref 134–144)
Total Protein: 7.5 g/dL (ref 6.0–8.5)

## 2019-08-29 LAB — HEPATITIS C ANTIBODY: Hep C Virus Ab: <0.1 s/co ratio (ref 0.0–0.9)

## 2019-08-30 ENCOUNTER — Other Ambulatory Visit: Payer: Self-pay

## 2019-08-30 ENCOUNTER — Emergency Department (HOSPITAL_COMMUNITY)
Admission: EM | Admit: 2019-08-30 | Discharge: 2019-08-30 | Disposition: A | Discharge status: Home / Self Care | Payer: 59 | Attending: Emergency Medicine | Admitting: Emergency Medicine

## 2019-08-30 DIAGNOSIS — Z91040 Latex allergy status: Secondary | ICD-10-CM | POA: Insufficient documentation

## 2019-08-30 DIAGNOSIS — Z8673 Personal history of transient ischemic attack (TIA), and cerebral infarction without residual deficits: Secondary | ICD-10-CM | POA: Diagnosis not present

## 2019-08-30 DIAGNOSIS — N183 Chronic kidney disease, stage 3 unspecified: Secondary | ICD-10-CM | POA: Diagnosis not present

## 2019-08-30 DIAGNOSIS — E876 Hypokalemia: Secondary | ICD-10-CM

## 2019-08-30 DIAGNOSIS — Z79899 Other long term (current) drug therapy: Secondary | ICD-10-CM | POA: Diagnosis not present

## 2019-08-30 DIAGNOSIS — Z960 Presence of urogenital implants: Secondary | ICD-10-CM | POA: Diagnosis not present

## 2019-08-30 DIAGNOSIS — E1122 Type 2 diabetes mellitus with diabetic chronic kidney disease: Secondary | ICD-10-CM | POA: Insufficient documentation

## 2019-08-30 DIAGNOSIS — Z794 Long term (current) use of insulin: Secondary | ICD-10-CM | POA: Diagnosis not present

## 2019-08-30 DIAGNOSIS — F17210 Nicotine dependence, cigarettes, uncomplicated: Secondary | ICD-10-CM | POA: Diagnosis not present

## 2019-08-30 DIAGNOSIS — N3000 Acute cystitis without hematuria: Secondary | ICD-10-CM

## 2019-08-30 DIAGNOSIS — I129 Hypertensive chronic kidney disease with stage 1 through stage 4 chronic kidney disease, or unspecified chronic kidney disease: Secondary | ICD-10-CM | POA: Insufficient documentation

## 2019-08-30 DIAGNOSIS — R197 Diarrhea, unspecified: Secondary | ICD-10-CM | POA: Diagnosis present

## 2019-08-30 LAB — URINALYSIS, ROUTINE W REFLEX MICROSCOPIC
Bilirubin Urine: NEGATIVE
Glucose, UA: NEGATIVE mg/dL
Hgb urine dipstick: NEGATIVE
Ketones, ur: NEGATIVE mg/dL
Nitrite: POSITIVE — AB
Protein, ur: NEGATIVE mg/dL
Specific Gravity, Urine: 1.014 (ref 1.005–1.030)
pH: 5 (ref 5.0–8.0)

## 2019-08-30 LAB — CBC
HCT: 49.5 % — ABNORMAL HIGH (ref 36.0–46.0)
Hemoglobin: 15.7 g/dL — ABNORMAL HIGH (ref 12.0–15.0)
MCH: 31.8 pg (ref 26.0–34.0)
MCHC: 31.7 g/dL (ref 30.0–36.0)
MCV: 100.4 fL — ABNORMAL HIGH (ref 80.0–100.0)
Platelets: 344 10*3/uL (ref 150–400)
RBC: 4.93 MIL/uL (ref 3.87–5.11)
RDW: 14.3 % (ref 11.5–15.5)
WBC: 11 10*3/uL — ABNORMAL HIGH (ref 4.0–10.5)
nRBC: 0 % (ref 0.0–0.2)

## 2019-08-30 LAB — I-STAT BETA HCG BLOOD, ED (MC, WL, AP ONLY): I-stat hCG, quantitative: <5 m[IU]/mL (ref <5)

## 2019-08-30 LAB — COMPREHENSIVE METABOLIC PANEL
ALT: 22 U/L (ref 0–44)
AST: 22 U/L (ref 15–41)
Albumin: 4 g/dL (ref 3.5–5.0)
Alkaline Phosphatase: 105 U/L (ref 38–126)
Anion gap: 20 — ABNORMAL HIGH (ref 5–15)
BUN: 24 mg/dL — ABNORMAL HIGH (ref 6–20)
CO2: 20 mmol/L — ABNORMAL LOW (ref 22–32)
Calcium: 10.3 mg/dL (ref 8.9–10.3)
Chloride: 102 mmol/L (ref 98–111)
Creatinine, Ser: 1.76 mg/dL — ABNORMAL HIGH (ref 0.44–1.00)
GFR calc Af Amer: 38 mL/min — ABNORMAL LOW (ref >60)
GFR calc non Af Amer: 33 mL/min — ABNORMAL LOW (ref >60)
Glucose, Bld: 202 mg/dL — ABNORMAL HIGH (ref 70–99)
Potassium: 3.1 mmol/L — ABNORMAL LOW (ref 3.5–5.1)
Sodium: 142 mmol/L (ref 135–145)
Total Bilirubin: 0.6 mg/dL (ref 0.3–1.2)
Total Protein: 7.5 g/dL (ref 6.5–8.1)

## 2019-08-30 LAB — LIPASE, BLOOD: Lipase: 34 U/L (ref 11–51)

## 2019-08-30 MED ORDER — CEPHALEXIN 500 MG PO CAPS
500.0000 mg | ORAL_CAPSULE | Freq: Three times a day (TID) | ORAL | 0 refills | Status: AC
Start: 2019-08-30 — End: 2019-09-06

## 2019-08-30 MED ORDER — SODIUM CHLORIDE 0.9% FLUSH
3.0000 mL | Freq: Once | INTRAVENOUS | Status: AC
  Administered 2019-08-30: 3 mL via INTRAVENOUS

## 2019-08-30 MED ORDER — SODIUM CHLORIDE 0.9 % IV BOLUS
1000.0000 mL | Freq: Once | INTRAVENOUS | Status: AC
  Administered 2019-08-30: 1000 mL via INTRAVENOUS

## 2019-08-30 MED ORDER — POTASSIUM CHLORIDE CRYS ER 20 MEQ PO TBCR
20.0000 meq | EXTENDED_RELEASE_TABLET | Freq: Every day | ORAL | 0 refills | Status: DC
Start: 2019-08-30 — End: 2019-09-25

## 2019-08-30 NOTE — ED Triage Notes (Signed)
Pt here sent by PCP for eval of high WBC (16.1 on 6/7). Pt endorses diarrhea x 3 months since her last hospitalization. Also states "I always have UTIs, I probably have one now."

## 2019-08-30 NOTE — Discharge Instructions (Addendum)
Take antibiotics as prescribed.  Take the entire course, even if your symptoms improve. Make sure you are staying well-hydrated water. Your potassium was slightly low today.  Take potassium pills for the next week.  This is likely due to your diarrhea. Continue taking your home medications as prescribed. Follow-up with your primary care doctor for further evaluation, testing, and management of your diarrhea.  Return to the ER if your develop fevers, severe abdominal pain, blood in your stool, or any new, worsening, or concerning symptoms.

## 2019-08-30 NOTE — ED Provider Notes (Signed)
  Physical Exam  BP 92/62   Pulse 70   Temp 99.3 F (37.4 C) (Oral)   Resp 19   Ht 5\' 2"  (1.575 m)   Wt 92.5 kg   SpO2 94%   BMI 37.30 kg/m   Physical Exam  Gen: appears nontoxic Abd: no ttp  ED Course/Procedures     Procedures  MDM   Patient signed out to me by Jamey Ripa, PA-C.  Please see previous notes for further history.  In brief, patient presenting for evaluation of 3-week history of diarrhea.  She saw her PCP yesterday, was found to have a leukocytosis of 16, thus sent to the ER.  On exam, patient appears nontoxic.  No abdominal tenderness.  Her blood pressure has been soft, patient states this is baseline.  Lab work in the ER overall reassuring, shows leukocytosis of 11, improved from yesterday.  Pending a UTI, she has dysuria and frequent UTIs.  Pending stool sample.  Of note, patient's potassium is slightly low at 3.1, replenished orally over the next week.  Urine positive for UTI with nitrates, leuks, many bacteria.  Will send urine culture due to her frequent nature of UTIs.  Per last culture on 05-25-2019, she had multiple species, sensitive to ceftriaxone.  As such, will treat with Keflex.  Blood pressure is improved with fluids.  Patient states she normally is in the 64B to 58X systolic.  As such, we will not give further fluids.  Discussed that patient has been unable to give a stool sample, patient does not want a wait in the ER to do so.  She states her diarrhea has improved since she has decreased her Metformin dosing.  In the setting of no fever, no abdominal pain, and improved symptoms when decreasing Metformin, I do not believe she needs emergent stool sampling.  This can be done outpatient with her primary care doctor.  At this time, patient appears safe for discharge.  Return precautions given.  Patient states she understands and agrees plan.       Franchot [place]idelberg, PA-C 08/30/19 1725    Quintella Reichert, MD 08/31/19 1222

## 2019-08-30 NOTE — ED Provider Notes (Signed)
[facility] EMERGENCY DEPARTMENT Provider Note   CSN: [id number] Arrival date & time: 08/30/19  1219     History Chief Complaint  Patient presents with  . Diarrhea    Pamela Carney is a 54 y.o. female.  HPI 54 year old female with an extensive medical history including hyperlipidemia, bipolar 1 disorder,, obesity, recurrent UTIs, GERD, DM type II, hypertension, depression, CKD stage III presents to the ED from her PCPs office with complaints of 3 months of diarrhea since her last hospital admission and an elevated white count of 16.  Patient states that she has had loose watery nonbloody non-foul-smelling diarrhea since she was admitted to the hospital 3 months ago for acute renal failure suspected secondary to UTI.  However per chart review, patient had been complaining of 4 days of diarrhea prior to her hospital admission.  Patient states that at times she will get the urge to go suddenly and has very little time to find a bathroom.  She also notes pain with urination over the last few days, but no blood.  No blood in her stools either.  She states that she will have some intermittent abdominal pain when she goes to the bathroom, but no other abdominal pain.  She is able to eat and drink without difficulty.  Patient was seen by her PCP yesterday and was noted to have a white count of 16, and in the setting of chronic diarrhea was told to come to the ER for further evaluation.  Per chart review, PCP had ordered a stool PCR over a month ago but the patient had never picked it up.  She states that she probably has a UTI as she very frequently gets them.  She denies any flank pain.  No known Covid exposures or sick contacts.  No fevers, chills, nausea, vomiting, weakness, chest pain, shortness of breath, headaches.    Past Medical History:  Diagnosis Date  . Abnormal Pap smear   . AKI (acute kidney injury) ([place]) 12/28/2017  . Anxiety   . Arthritis    bil knees, neck  .  Bipolar 1 disorder ([place])   . Cataract    Mild  . Cholelithiasis 12/23/2017   noted on CT renal, pt unaware  . Chronic kidney disease (CKD), stage III (moderate)   . Cystitis   . Depression   . Diabetes mellitus without complication ([place])    type 2  . Genital warts    Hx of genital  . GERD (gastroesophageal reflux disease)   . Heart murmur    childhood  . Hematuria   . History of blood transfusion   . History of ectopic pregnancy   . History of gestational diabetes   . History of kidney stones   . History of sepsis    after ectopic pregnancy  . Hyperlipidemia   . Hypertension   . Idiopathic peripheral neuropathy    both feet  . Leg ulcer, left ([place])   . Low back pain   . Migraines   . Obesity   . Oral candidiasis   . Pneumonia   . PONV (postoperative nausea and vomiting)    prolonged sedation  . Recurrent UTI   . 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 Sigmoid diverticulosis 12/23/2017   noted on CT renal, pt unaware  . Sleep apnea    uses cpap  . Stroke [facility])    Cryptogenic  .  Ulcer of foot ([place])    Left  . Weakness     Patient Active Problem List   Diagnosis Date Noted  . Acute renal failure (ARF) ([place]) 06/21/2019  . Nausea 06/21/2019  . Dehydration 06/21/2019  . HLD (hyperlipidemia) 06/21/2019  . Acute metabolic encephalopathy 03/00/9233  . UTI (urinary tract infection) 04/27/2019  . Cerebral embolism with cerebral infarction 12/22/2018  . Septic shock ([place]) 12/15/2018  . Ureteral stone with hydronephrosis   . Acute renal failure superimposed on stage 3 chronic kidney disease ([place])   . Type 2 diabetes mellitus with other specified complication ([place]) 00/76/2263  . Bipolar 1 disorder ([place]) 02/01/2018  . IUD (intrauterine device) in place 02/01/2018  . Obesity 02/01/2018  . Healthcare maintenance 02/01/2018  . CKD (chronic kidney disease), stage III 12/28/2017  .  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 Acute cystitis with hematuria   . Chronic interstitial nephritis 12/24/2017  . Generalized weakness 12/24/2017  . Sepsis ([place]) 12/24/2017  . Hypokalemia 12/24/2017  . Oral candidiasis 12/24/2017  . Neuropathy involving both lower extremities 01/31/2014  . Perimenopausal 12/29/2012  . Family planning, IUD (intrauterine device) check/reinsertion/removal 12/28/2012  . Pain 12/27/2012    Past Surgical History:  Procedure Laterality Date  . BUBBLE STUDY  12/23/2018   Procedure: BUBBLE STUDY;  Surgeon: Lelon Perla, MD;  Location: [facility] ENDOSCOPY;  Service: Cardiovascular;;  . CERVICAL CONE BIOPSY    . CESAREAN SECTION     x3  . CYSTOSCOPY W/ URETERAL STENT PLACEMENT Right 12/15/2018   Procedure: Cystoscopy, right retrograde ureteropyelogram, fluoroscopic interpretation, right double-J stent placement (24 cm x 6 [place]);  Surgeon: Franchot Gallo, MD;  Location: [facility];  Service: Urology;  Laterality: Right;  . CYSTOSCOPY WITH RETROGRADE PYELOGRAM, URETEROSCOPY AND STENT PLACEMENT Right 01/27/2019   Procedure: CYSTOSCOPY WITH RETROGRADE PYELOGRAM, URETEROSCOPY AND STENT PLACEMENT; BASKET STONE RETRIEVAL;  Surgeon: Alexis Frock, MD;  Location: [facility];  Service: Urology;  Laterality: Right;  75 MINS  . CYSTOSCOPY/URETEROSCOPY/HOLMIUM LASER/STENT PLACEMENT Right 02/04/2018   Procedure: CYSTOSCOPY/URETEROSCOPY/RETROGRADE PYELOGRAM/HOLMIUM LASER/STENT PLACEMENT;  Surgeon: Alexis Frock, MD;  Location: [facility];  Service: Urology;  Laterality: Right;  . CYSTOSCOPY/URETEROSCOPY/HOLMIUM LASER/STENT PLACEMENT Right 02/07/2018   Procedure: CYSTOSCOPY LEFT STENT EXCHANGE;  Surgeon: Alexis Frock, MD;  Location: [facility];  Service: Urology;  Laterality: Right;  . DILATION AND CURETTAGE OF UTERUS    . ECTOPIC PREGNANCY SURGERY    . IR NEPHROSTOMY PLACEMENT LEFT  12/25/2017  . LOOP RECORDER INSERTION N/A 12/23/2018   Procedure: LOOP RECORDER INSERTION;  Surgeon: Constance Haw, MD;  Location: [place] CV LAB;  Service: Cardiovascular;  Laterality: N/A;  . NEPHROLITHOTOMY Left 02/04/2018   Procedure: NEPHROLITHOTOMY PERCUTANEOUS;  Surgeon: Alexis Frock, MD;  Location: [facility];  Service: Urology;  Laterality: Left;  3 HRS  . NEPHROLITHOTOMY Left 02/07/2018   Procedure: SECOND LOOK NEPHROLITHOTOMY PERCUTANEOUS;  Surgeon: Alexis Frock, MD;  Location: [facility];  Service: Urology;  Laterality: Left;  2 HRS  . OVARIAN CYST REMOVAL    . TEE WITHOUT CARDIOVERSION N/A 12/23/2018   Procedure: TRANSESOPHAGEAL ECHOCARDIOGRAM (TEE);  Surgeon: Lelon Perla, MD;  Location: [facility] ENDOSCOPY;  Service: Cardiovascular;  Laterality: N/A;  . UNILATERAL SALPINGECTOMY     Pt unsure of which fallopian tube was removed  . WISDOM TOOTH EXTRACTION       OB History    Gravida  7   Para  3   Term      Preterm  3   AB  2   Living  3     SAB  1   TAB      Ectopic  1   Multiple      Live Births  5           Family History  Problem Relation Age of Onset  . Diabetes Paternal Grandfather   . COPD Paternal Grandfather   . Heart disease Paternal Grandfather   . Cancer Paternal Grandfather        bone  . Cancer Paternal Grandmother   . Heart disease Paternal Grandmother   . Cancer Father   . Hypertension Father   . Heart attack Father   . Alcohol abuse Father   . Depression Father   . Heart failure Mother   . Diabetes Mother   . Heart disease Mother   . Hyperlipidemia Mother   . Hypertension Mother   . Heart attack Mother   . Peripheral vascular disease Mother   . Depression Mother   . COPD Mother   . Hypertension Sister   . Heart attack Sister   . Stroke Maternal Grandmother   . Colon cancer Neg Hx   . Colon polyps Neg Hx   . Esophageal cancer Neg Hx   . Rectal cancer Neg Hx   . Stomach cancer Neg Hx     Social History   Tobacco Use  . Smoking status: Current Every Day Smoker    Packs/day: 1.00    Years: 37.00    Pack years:  37.00    Types: Cigarettes  . Smokeless tobacco: Never Used  Substance Use Topics  . Alcohol use: No    Alcohol/week: 0.0 standard drinks  . Drug use: No    Home Medications Prior to Admission medications   Medication Sig Start Date End Date Taking? Authorizing Provider  atorvastatin (LIPITOR) 80 MG tablet Take 1 tablet (80 mg total) by mouth daily at 6 PM. 01/16/19   [name], NP  AZO CRANBERRY GUMMIES PO Take 2 tablets by mouth daily.    [provider]  Blood Glucose Monitoring Suppl (ONETOUCH VERIO) w/Device KIT Use to check blood sugars every morning fasting and 2 hours after largest meal 02/02/18   Danford, Valetta Fuller D, NP  cetirizine (ZYRTEC) 10 MG tablet Take 1 tablet (10 mg total) by mouth daily. 05/25/19 08/23/19  [name], NP  DULoxetine (CYMBALTA) 60 MG capsule Take 1 capsule (60 mg total) by mouth daily. 06/23/19 09/21/19  [name], NP  fluticasone (FLONASE) 50 MCG/ACT nasal spray Place 1 spray into both nostrils daily. 06/24/19 09/22/19  Mercy Riding, MD  glucose blood (ONETOUCH VERIO) test strip Use to check blood sugars every morning fasting and 2 hours after largest meal 02/23/19   [name], NP  indapamide (LOZOL) 2.5 MG tablet Take 2.5 mg by mouth every evening.    [provider]  insulin glargine (LANTUS) 100 UNIT/ML Solostar Pen Inject 15 Units into the skin at bedtime. 08/28/19   [name], NP  Insulin Pen Needle (PEN NEEDLES) 31G X 8 MM MISC Use as instructed. Inject into the skin once nightly. 08/28/19   [name], NP  metFORMIN (GLUCOPHAGE) 500 MG tablet Take 2 tablets (1,000 mg total) by mouth 2 (two) times daily with a meal. 05/25/19 08/23/19  [name], NP  Multiple Vitamins-Minerals (ONE-A-DAY WOMENS 50+ ADVANTAGE) TABS Take 1 tablet by mouth daily.    [provider]  ondansetron [facility]) 4  MG tablet Take 1 tablet (4 mg total) by mouth every 8 (eight) hours as needed for nausea or vomiting. 05/26/19   [name], NP  ONE TOUCH LANCETS MISC Use to check blood sugars every morning fasting and 2 hours after largest meal 02/02/18   Danford, Valetta Fuller D, NP  topiramate (TOPAMAX) 100 MG tablet Take 1 tablet (100 mg total) by mouth 2 (two) times daily. Patient taking differently: Take 100 mg by mouth 2 (two) times daily. 1/2 twice a day per patient. 06/27/19 06/26/20  Frann Rider, NP    Allergies    Sulfa antibiotics, Latex, and Tape  Review of Systems   Review of Systems  Constitutional: Negative for chills and fever.  HENT: Negative for ear pain and sore throat.   Eyes: Negative for pain and visual disturbance.  Respiratory: Negative for cough and shortness of breath.   Cardiovascular: Negative for chest pain and palpitations.  Gastrointestinal: Positive for abdominal pain and diarrhea. Negative for blood in stool and vomiting.  Genitourinary: Positive for dysuria. Negative for hematuria and urgency.  Musculoskeletal: Negative for arthralgias and back pain.  Skin: Negative for color change and rash.  Neurological: Negative for seizures, syncope and headaches.  Psychiatric/Behavioral: Negative for confusion.  All other systems reviewed and are negative.   Physical Exam Updated Vital Signs BP 92/62   Pulse 70   Temp 99.3 F (37.4 C) (Oral)   Resp 19   Ht 5' 2"  (1.575 m)   Wt 92.5 kg   SpO2 94%   BMI 37.30 kg/m   Physical Exam Vitals reviewed.  Constitutional:      General: She is not in acute distress.    Appearance: Normal appearance. She is obese. She is not ill-appearing, toxic-appearing or diaphoretic.  HENT:     Head: Normocephalic and atraumatic.     Nose: Nose normal.     Mouth/Throat:     Mouth: Mucous membranes are moist.  Eyes:     General:        Right eye: No discharge.        Left eye: No discharge.     Extraocular Movements: Extraocular movements intact.     Conjunctiva/sclera: Conjunctivae normal.     Pupils: Pupils are equal, round, and reactive to light.    Cardiovascular:     Rate and Rhythm: Normal rate and regular rhythm.     Pulses: Normal pulses.     Heart sounds: Normal heart sounds.  Pulmonary:     Effort: Pulmonary effort is normal.     Breath sounds: Normal breath sounds.  Abdominal:     General: Abdomen is flat.     Palpations: Abdomen is soft.     Tenderness: There is no abdominal tenderness.     Comments: Abdomen soft and nontender  Musculoskeletal:        General: No swelling or tenderness. Normal range of motion.     Cervical back: Normal range of motion and neck supple.  Skin:    General: Skin is warm and dry.     Findings: No erythema.  Neurological:     General: No focal deficit present.     Mental Status: She is alert and oriented to person, place, and time.  Psychiatric:        Mood and Affect: Mood normal.        Behavior: Behavior normal.     ED Results / Procedures / Treatments   Labs (all labs ordered are listed,  but only abnormal results are displayed) Labs Reviewed  COMPREHENSIVE METABOLIC PANEL - Abnormal; Notable for the following components:      Result Value   Potassium 3.1 (*)    CO2 20 (*)    Glucose, Bld 202 (*)    BUN 24 (*)    Creatinine, Ser 1.76 (*)    GFR calc non Af Amer 33 (*)    GFR calc Af Amer 38 (*)    Anion gap 20 (*)    All other components within normal limits  CBC - Abnormal; Notable for the following components:   WBC 11.0 (*)    Hemoglobin 15.7 (*)    HCT 49.5 (*)    MCV 100.4 (*)    All other components within normal limits  GASTROINTESTINAL PANEL BY PCR, STOOL (REPLACES STOOL CULTURE)  C DIFFICILE QUICK SCREEN W PCR REFLEX  URINE CULTURE  LIPASE, BLOOD  URINALYSIS, ROUTINE W REFLEX MICROSCOPIC  I-STAT BETA HCG BLOOD, ED ([facility], [facility], [facility] ONLY)    EKG None  Radiology No results found.  Procedures Procedures (including critical care time)  Medications Ordered in ED Medications  sodium chloride flush (NS) 0.9 % injection 3 mL (3 mLs Intravenous Given 08/30/19  1339)  sodium chloride 0.9 % bolus 1,000 mL (1,000 mLs Intravenous New Bag/Given 08/30/19 1337)    ED Course  I have reviewed the triage vital signs and the nursing notes.  Pertinent labs & imaging results that were available during my care of the patient were reviewed by me and considered in my medical decision making (see chart for details).    MDM Rules/Calculators/A&P                     54 year old with chronic diarrhea for over the last 3 months and an elevated white count at PCPs office, with dysuria for the last 3 days On presentation to the ER, the patient is alert and oriented, nontoxic-appearing, no acute distress, ambulating in the ED room without difficulty.  She is afebrile, though her blood pressure is a little bit soft at 85/57.  Per chart review, her blood pressure does tend to run low.  We will give 1 L fluid bolus.  Pending basic labs, UA,, stool culture if able to acquire.  CBC today with a white count of 11.  Mildly increased hemoglobin hematocrit and MCV.  No other acute abnormalities.  CMP with mildly decreased potassium of 3.1, creatinine today 1.76 which is actually improved from her values 2 days ago, likely consistent in the setting of CKD stage III.  AST and ALT normal.  Lipase normal.  Awaiting urine and stool panel.  Blood pressure improved after bolus.  Signout patient to [employer]. She will follow up on UA and stool PCR, monitor BP's. Anticipate she will be stable for discharge.    Final Clinical Impression(s) / ED Diagnoses Final diagnoses:  None    Rx / DC Orders ED Discharge Orders    None       Garald Balding, PA-C 08/30/19 1529    Sherwood Gambler, MD 08/30/19 [phone]

## 2019-09-01 LAB — URINE CULTURE: Culture: >=100000 — AB

## 2019-09-03 ENCOUNTER — Telehealth: Payer: Self-pay | Admitting: Emergency Medicine

## 2019-09-03 NOTE — Telephone Encounter (Signed)
Post ED Visit - Positive Culture Follow-up  Culture report reviewed by antimicrobial stewardship pharmacist: [place] Team []  Elenor Quinones, Pharm.D. []  Heide Guile, Pharm.D., BCPS AQ-ID []  Parks Neptune, Pharm.D., BCPS []  Alycia Rossetti, Pharm.D., BCPS []  [place], Pharm.D., BCPS, AAHIVP []  Legrand Como, Pharm.D., BCPS, AAHIVP [x]  Salome Arnt, PharmD, BCPS []  Johnnette Gourd, PharmD, BCPS []  Hughes Better, PharmD, BCPS []  Leeroy Cha, PharmD []  Laqueta Linden, PharmD, BCPS []  Albertina Parr, PharmD  [place] Team []  Leodis Sias, PharmD []  Lindell Spar, PharmD []  Royetta Asal, PharmD []  Graylin Shiver, Rph []  Rema Fendt) Glennon Mac, PharmD []  Arlyn Dunning, PharmD []  Netta Cedars, PharmD []  Dia Sitter, PharmD []  Leone Haven, PharmD []  Gretta Arab, PharmD []  Theodis Shove, PharmD []  Peggyann Juba, PharmD []  Reuel Boom, PharmD   Positive urine culture Treated with Cephalexin, organism sensitive to the same and no further patient follow-up is required at this time.  Sandi Raveling Kinsler Soeder 09/03/2019, 10:37 AM

## 2019-09-13 ENCOUNTER — Other Ambulatory Visit: Payer: Self-pay

## 2019-09-13 ENCOUNTER — Telehealth: Payer: Self-pay | Admitting: Nurse Practitioner

## 2019-09-13 ENCOUNTER — Other Ambulatory Visit: Payer: Self-pay | Admitting: Nurse Practitioner

## 2019-09-13 ENCOUNTER — Ambulatory Visit: Payer: 59 | Attending: Nurse Practitioner | Admitting: Nurse Practitioner

## 2019-09-13 DIAGNOSIS — G5793 Unspecified mononeuropathy of bilateral lower limbs: Secondary | ICD-10-CM

## 2019-09-13 NOTE — Telephone Encounter (Signed)
Rx sent 

## 2019-09-13 NOTE — Telephone Encounter (Signed)
1) Medication(s) Requested (by name): DULoxetine (CYMBALTA) 60 MG capsule    2) Pharmacy of Choice: [facility] [address]   3) Special Requests:   Approved medications will be sent to the pharmacy, we will reach out if there is an issue.  Requests made after 3pm may not be addressed until the following business day!  If a patient is unsure of the name of the medication(s) please note and ask patient to call back when they are able to provide all info, do not send to responsible party until all information is available!

## 2019-09-15 ENCOUNTER — Other Ambulatory Visit: Payer: Self-pay | Admitting: Nurse Practitioner

## 2019-09-15 DIAGNOSIS — B370 Candidal stomatitis: Secondary | ICD-10-CM

## 2019-09-15 DIAGNOSIS — N760 Acute vaginitis: Secondary | ICD-10-CM

## 2019-09-15 DIAGNOSIS — N3000 Acute cystitis without hematuria: Secondary | ICD-10-CM

## 2019-09-15 MED ORDER — PHENAZOPYRIDINE HCL 100 MG PO TABS
100.0000 mg | ORAL_TABLET | Freq: Three times a day (TID) | ORAL | 0 refills | Status: DC | PRN
Start: 2019-09-15 — End: 2019-09-15

## 2019-09-15 MED ORDER — FLUCONAZOLE 150 MG PO TABS: 150.0000 mg | ORAL_TABLET | Freq: Once | ORAL | 1 refills | Status: AC

## 2019-09-18 ENCOUNTER — Other Ambulatory Visit: Payer: 59

## 2019-09-18 ENCOUNTER — Ambulatory Visit (INDEPENDENT_AMBULATORY_CARE_PROVIDER_SITE_OTHER): Payer: 59 | Admitting: *Deleted

## 2019-09-18 DIAGNOSIS — I6349 Cerebral infarction due to embolism of other cerebral artery: Secondary | ICD-10-CM | POA: Diagnosis not present

## 2019-09-18 LAB — CUP PACEART REMOTE DEVICE CHECK
Date Time Interrogation Session: 20210627231101
Implantable Pulse Generator Implant Date: 20201002

## 2019-09-20 NOTE — Progress Notes (Signed)
Carelink Summary Report / Loop Recorder 

## 2019-09-21 ENCOUNTER — Ambulatory Visit: Payer: Self-pay | Admitting: Nurse Practitioner

## 2019-09-21 NOTE — Telephone Encounter (Signed)
Pt's daughter in law called to report that pt. Is c/o multiple symptoms.  Patient is present with daughter in law.  Reported pt. C/o dizziness, feeling "fuzzy-headed", headache, clammy, nauseated, and unsteady on feet.  Daughter in law seemed anxious in reporting symptoms.  Stated "I just need to know if I should take her to the ER."  Denied that pt. Has facial droop, vision loss, speech difficulty.  Stated "she is unsteady, and her legs are weak, like they're going to give out on her."  Stated "she had a stroke in the past, and a lot of the symptoms you're asking about, she never had with her stroke."  Questioned about her blood sugar. Daughter in law reported pt's "BS was about 146 or something like that this morning, before she ate anything."  Has not rechecked blood sugar.  Also reported pt. has not been urinating as much as usual.  Questioned if pt. Is complaining of any other symptoms.  Daughter in law reported she said she is having trouble catching her breath.  Advised daughter in law that pt. will need to go to ER.  Questioned if anyone can assist her in getting pt. To ER?  Daughter in law stated, no one else was there at this time.  Stated  "I can take her, because I am used to taking care of her."  Advised if pt. Shows any signs of distress, she should call 911.  Daughter in law verb. Understanding.    Reason for Disposition . Headache  (and neurologic deficit)  Answer Assessment - Initial Assessment Questions 1. SYMPTOM: "What is the main symptom you are concerned about?" (e.g., weakness, numbness)     Dizzy, clammy, fuzzy-headed , unsteady, weakness in legs  2. ONSET: "When did this start?" (minutes, hours, days; while sleeping)     Yesterday afternoon 3. LAST NORMAL: "When was the last time you were normal (no symptoms)?"     Daughter in law stated more tendency to sleep last couple of days 4. PATTERN "Does this come and go, or has it been constant since it started?"  "Is it present  now?"      5. CARDIAC SYMPTOMS: "Have you had any of the following symptoms: chest pain, difficulty breathing, palpitations?"     Denied chest pain.  Onset of shortness of breath 6. NEUROLOGIC SYMPTOMS: "Have you had any of the following symptoms: headache, dizziness, vision loss, double vision, changes in speech, unsteady on your feet?"     C/o headache, dizziness, unsteady on feet  7. OTHER SYMPTOMS: "Do you have any other symptoms?"     Feels like she can't get her breath.  8. PREGNANCY: "Is there any chance you are pregnant?" "When was your last menstrual period?"     n/a  Protocols used: NEUROLOGIC DEFICIT-A-AH

## 2019-09-22 NOTE — Telephone Encounter (Signed)
PCP agreed w/ [facility] advising.

## 2019-09-22 NOTE — Telephone Encounter (Signed)
Please advise 

## 2019-09-23 ENCOUNTER — Observation Stay (HOSPITAL_COMMUNITY): Payer: 59

## 2019-09-23 ENCOUNTER — Encounter (HOSPITAL_COMMUNITY): Payer: Self-pay | Admitting: Emergency Medicine

## 2019-09-23 ENCOUNTER — Observation Stay (HOSPITAL_BASED_OUTPATIENT_CLINIC_OR_DEPARTMENT_OTHER): Payer: 59

## 2019-09-23 ENCOUNTER — Other Ambulatory Visit: Payer: Self-pay

## 2019-09-23 ENCOUNTER — Inpatient Hospital Stay (HOSPITAL_COMMUNITY)
Admission: EM | Admit: 2019-09-23 | Discharge: 2019-09-25 | DRG: 065 | Disposition: A | Discharge status: Home Health | Payer: 59 | Attending: Internal Medicine | Admitting: Internal Medicine

## 2019-09-23 ENCOUNTER — Emergency Department (HOSPITAL_COMMUNITY): Payer: 59

## 2019-09-23 DIAGNOSIS — R29700 NIHSS score 0: Secondary | ICD-10-CM | POA: Diagnosis present

## 2019-09-23 DIAGNOSIS — I6529 Occlusion and stenosis of unspecified carotid artery: Secondary | ICD-10-CM | POA: Diagnosis not present

## 2019-09-23 DIAGNOSIS — Z87440 Personal history of urinary (tract) infections: Secondary | ICD-10-CM

## 2019-09-23 DIAGNOSIS — Z91040 Latex allergy status: Secondary | ICD-10-CM

## 2019-09-23 DIAGNOSIS — I6782 Cerebral ischemia: Secondary | ICD-10-CM | POA: Diagnosis present

## 2019-09-23 DIAGNOSIS — G43909 Migraine, unspecified, not intractable, without status migrainosus: Secondary | ICD-10-CM | POA: Diagnosis present

## 2019-09-23 DIAGNOSIS — K529 Noninfective gastroenteritis and colitis, unspecified: Secondary | ICD-10-CM | POA: Diagnosis present

## 2019-09-23 DIAGNOSIS — I6340 Cerebral infarction due to embolism of unspecified cerebral artery: Principal | ICD-10-CM | POA: Diagnosis present

## 2019-09-23 DIAGNOSIS — M170 Bilateral primary osteoarthritis of knee: Secondary | ICD-10-CM | POA: Diagnosis present

## 2019-09-23 DIAGNOSIS — Z83438 Family history of other disorder of lipoprotein metabolism and other lipidemia: Secondary | ICD-10-CM

## 2019-09-23 DIAGNOSIS — Z8701 Personal history of pneumonia (recurrent): Secondary | ICD-10-CM

## 2019-09-23 DIAGNOSIS — F17210 Nicotine dependence, cigarettes, uncomplicated: Secondary | ICD-10-CM | POA: Diagnosis present

## 2019-09-23 DIAGNOSIS — R7989 Other specified abnormal findings of blood chemistry: Secondary | ICD-10-CM | POA: Diagnosis present

## 2019-09-23 DIAGNOSIS — I6522 Occlusion and stenosis of left carotid artery: Secondary | ICD-10-CM

## 2019-09-23 DIAGNOSIS — F419 Anxiety disorder, unspecified: Secondary | ICD-10-CM | POA: Diagnosis present

## 2019-09-23 DIAGNOSIS — I639 Cerebral infarction, unspecified: Secondary | ICD-10-CM | POA: Diagnosis not present

## 2019-09-23 DIAGNOSIS — I129 Hypertensive chronic kidney disease with stage 1 through stage 4 chronic kidney disease, or unspecified chronic kidney disease: Secondary | ICD-10-CM | POA: Diagnosis present

## 2019-09-23 DIAGNOSIS — I6310 Cerebral infarction due to embolism of unspecified precerebral artery: Secondary | ICD-10-CM | POA: Diagnosis not present

## 2019-09-23 DIAGNOSIS — D3501 Benign neoplasm of right adrenal gland: Secondary | ICD-10-CM | POA: Diagnosis present

## 2019-09-23 DIAGNOSIS — E860 Dehydration: Secondary | ICD-10-CM | POA: Diagnosis present

## 2019-09-23 DIAGNOSIS — R42 Dizziness and giddiness: Secondary | ICD-10-CM

## 2019-09-23 DIAGNOSIS — Z818 Family history of other mental and behavioral disorders: Secondary | ICD-10-CM

## 2019-09-23 DIAGNOSIS — Z8249 Family history of ischemic heart disease and other diseases of the circulatory system: Secondary | ICD-10-CM

## 2019-09-23 DIAGNOSIS — Z6838 Body mass index (BMI) 38.0-38.9, adult: Secondary | ICD-10-CM

## 2019-09-23 DIAGNOSIS — I6389 Other cerebral infarction: Secondary | ICD-10-CM | POA: Diagnosis not present

## 2019-09-23 DIAGNOSIS — Z9109 Other allergy status, other than to drugs and biological substances: Secondary | ICD-10-CM

## 2019-09-23 DIAGNOSIS — Z8632 Personal history of gestational diabetes: Secondary | ICD-10-CM

## 2019-09-23 DIAGNOSIS — I959 Hypotension, unspecified: Secondary | ICD-10-CM | POA: Diagnosis not present

## 2019-09-23 DIAGNOSIS — E861 Hypovolemia: Secondary | ICD-10-CM | POA: Diagnosis present

## 2019-09-23 DIAGNOSIS — R197 Diarrhea, unspecified: Secondary | ICD-10-CM | POA: Diagnosis present

## 2019-09-23 DIAGNOSIS — Z794 Long term (current) use of insulin: Secondary | ICD-10-CM

## 2019-09-23 DIAGNOSIS — F319 Bipolar disorder, unspecified: Secondary | ICD-10-CM | POA: Diagnosis present

## 2019-09-23 DIAGNOSIS — Z79899 Other long term (current) drug therapy: Secondary | ICD-10-CM

## 2019-09-23 DIAGNOSIS — N390 Urinary tract infection, site not specified: Secondary | ICD-10-CM | POA: Diagnosis present

## 2019-09-23 DIAGNOSIS — R634 Abnormal weight loss: Secondary | ICD-10-CM | POA: Diagnosis present

## 2019-09-23 DIAGNOSIS — K551 Chronic vascular disorders of intestine: Secondary | ICD-10-CM | POA: Diagnosis present

## 2019-09-23 DIAGNOSIS — E1122 Type 2 diabetes mellitus with diabetic chronic kidney disease: Secondary | ICD-10-CM | POA: Diagnosis present

## 2019-09-23 DIAGNOSIS — E669 Obesity, unspecified: Secondary | ICD-10-CM | POA: Diagnosis present

## 2019-09-23 DIAGNOSIS — Z823 Family history of stroke: Secondary | ICD-10-CM

## 2019-09-23 DIAGNOSIS — N1831 Chronic kidney disease, stage 3a: Secondary | ICD-10-CM | POA: Diagnosis present

## 2019-09-23 DIAGNOSIS — Z882 Allergy status to sulfonamides status: Secondary | ICD-10-CM

## 2019-09-23 DIAGNOSIS — Z87442 Personal history of urinary calculi: Secondary | ICD-10-CM

## 2019-09-23 DIAGNOSIS — Z8673 Personal history of transient ischemic attack (TIA), and cerebral infarction without residual deficits: Secondary | ICD-10-CM

## 2019-09-23 DIAGNOSIS — Z20822 Contact with and (suspected) exposure to covid-19: Secondary | ICD-10-CM | POA: Diagnosis present

## 2019-09-23 DIAGNOSIS — M1909 Primary osteoarthritis, other specified site: Secondary | ICD-10-CM | POA: Diagnosis present

## 2019-09-23 DIAGNOSIS — I6523 Occlusion and stenosis of bilateral carotid arteries: Secondary | ICD-10-CM | POA: Diagnosis present

## 2019-09-23 DIAGNOSIS — Z833 Family history of diabetes mellitus: Secondary | ICD-10-CM

## 2019-09-23 DIAGNOSIS — E785 Hyperlipidemia, unspecified: Secondary | ICD-10-CM | POA: Diagnosis present

## 2019-09-23 DIAGNOSIS — G4733 Obstructive sleep apnea (adult) (pediatric): Secondary | ICD-10-CM | POA: Diagnosis present

## 2019-09-23 DIAGNOSIS — G609 Hereditary and idiopathic neuropathy, unspecified: Secondary | ICD-10-CM | POA: Diagnosis present

## 2019-09-23 DIAGNOSIS — E876 Hypokalemia: Secondary | ICD-10-CM | POA: Diagnosis not present

## 2019-09-23 DIAGNOSIS — IMO0002 Reserved for concepts with insufficient information to code with codable children: Secondary | ICD-10-CM

## 2019-09-23 DIAGNOSIS — E781 Pure hyperglyceridemia: Secondary | ICD-10-CM | POA: Diagnosis present

## 2019-09-23 LAB — COMPREHENSIVE METABOLIC PANEL
ALT: 23 U/L (ref 0–44)
AST: 23 U/L (ref 15–41)
Albumin: 3.4 g/dL — ABNORMAL LOW (ref 3.5–5.0)
Alkaline Phosphatase: 116 U/L (ref 38–126)
Anion gap: 15 (ref 5–15)
BUN: 21 mg/dL — ABNORMAL HIGH (ref 6–20)
CO2: 24 mmol/L (ref 22–32)
Calcium: 9.3 mg/dL (ref 8.9–10.3)
Chloride: 100 mmol/L (ref 98–111)
Creatinine, Ser: 1.69 mg/dL — ABNORMAL HIGH (ref 0.44–1.00)
GFR calc Af Amer: 40 mL/min — ABNORMAL LOW (ref >60)
GFR calc non Af Amer: 34 mL/min — ABNORMAL LOW (ref >60)
Glucose, Bld: 152 mg/dL — ABNORMAL HIGH (ref 70–99)
Potassium: 2.7 mmol/L — CL (ref 3.5–5.1)
Sodium: 139 mmol/L (ref 135–145)
Total Bilirubin: 0.4 mg/dL (ref 0.3–1.2)
Total Protein: 6.8 g/dL (ref 6.5–8.1)

## 2019-09-23 LAB — PROTIME-INR
INR: 0.9 (ref 0.8–1.2)
Prothrombin Time: 11.8 seconds (ref 11.4–15.2)

## 2019-09-23 LAB — LIPID PANEL
Cholesterol: 227 mg/dL — ABNORMAL HIGH (ref 0–200)
HDL: 30 mg/dL — ABNORMAL LOW (ref >40)
LDL Cholesterol: UNDETERMINED mg/dL (ref 0–99)
Total CHOL/HDL Ratio: 7.6 RATIO
Triglycerides: 455 mg/dL — ABNORMAL HIGH (ref <150)
VLDL: UNDETERMINED mg/dL (ref 0–40)

## 2019-09-23 LAB — HEMOGLOBIN A1C
Hgb A1c MFr Bld: 7.5 % — ABNORMAL HIGH (ref 4.8–5.6)
Mean Plasma Glucose: 168.55 mg/dL

## 2019-09-23 LAB — DIFFERENTIAL
Abs Immature Granulocytes: 0.03 10*3/uL (ref 0.00–0.07)
Basophils Absolute: 0.1 10*3/uL (ref 0.0–0.1)
Basophils Relative: 1 %
Eosinophils Absolute: 0.1 10*3/uL (ref 0.0–0.5)
Eosinophils Relative: 2 %
Immature Granulocytes: 0 %
Lymphocytes Relative: 24 %
Lymphs Abs: 2.3 10*3/uL (ref 0.7–4.0)
Monocytes Absolute: 0.7 10*3/uL (ref 0.1–1.0)
Monocytes Relative: 7 %
Neutro Abs: 6.4 10*3/uL (ref 1.7–7.7)
Neutrophils Relative %: 66 %

## 2019-09-23 LAB — LDL CHOLESTEROL, DIRECT: Direct LDL: 114.1 mg/dL — ABNORMAL HIGH (ref 0–99)

## 2019-09-23 LAB — BASIC METABOLIC PANEL
Anion gap: 8 (ref 5–15)
BUN: 16 mg/dL (ref 6–20)
CO2: 24 mmol/L (ref 22–32)
Calcium: 8.7 mg/dL — ABNORMAL LOW (ref 8.9–10.3)
Chloride: 106 mmol/L (ref 98–111)
Creatinine, Ser: 1.34 mg/dL — ABNORMAL HIGH (ref 0.44–1.00)
GFR calc Af Amer: 53 mL/min — ABNORMAL LOW (ref >60)
GFR calc non Af Amer: 45 mL/min — ABNORMAL LOW (ref >60)
Glucose, Bld: 169 mg/dL — ABNORMAL HIGH (ref 70–99)
Potassium: 3.3 mmol/L — ABNORMAL LOW (ref 3.5–5.1)
Sodium: 138 mmol/L (ref 135–145)

## 2019-09-23 LAB — APTT: aPTT: 30 seconds (ref 24–36)

## 2019-09-23 LAB — CBC
HCT: 45.6 % (ref 36.0–46.0)
Hemoglobin: 14.3 g/dL (ref 12.0–15.0)
MCH: 30.9 pg (ref 26.0–34.0)
MCHC: 31.4 g/dL (ref 30.0–36.0)
MCV: 98.5 fL (ref 80.0–100.0)
Platelets: 354 10*3/uL (ref 150–400)
RBC: 4.63 MIL/uL (ref 3.87–5.11)
RDW: 13.2 % (ref 11.5–15.5)
WBC: 9.6 10*3/uL (ref 4.0–10.5)
nRBC: 0 % (ref 0.0–0.2)

## 2019-09-23 LAB — I-STAT CHEM 8, ED
BUN: 23 mg/dL — ABNORMAL HIGH (ref 6–20)
Calcium, Ion: 1.13 mmol/L — ABNORMAL LOW (ref 1.15–1.40)
Chloride: 100 mmol/L (ref 98–111)
Creatinine, Ser: 1.7 mg/dL — ABNORMAL HIGH (ref 0.44–1.00)
Glucose, Bld: 151 mg/dL — ABNORMAL HIGH (ref 70–99)
HCT: 43 % (ref 36.0–46.0)
Hemoglobin: 14.6 g/dL (ref 12.0–15.0)
Potassium: 2.6 mmol/L — CL (ref 3.5–5.1)
Sodium: 140 mmol/L (ref 135–145)
TCO2: 29 mmol/L (ref 22–32)

## 2019-09-23 LAB — SARS CORONAVIRUS 2 BY RT PCR (HOSPITAL ORDER, PERFORMED IN ~~LOC~~ HOSPITAL LAB): SARS Coronavirus 2: NEGATIVE

## 2019-09-23 LAB — I-STAT BETA HCG BLOOD, ED (MC, WL, AP ONLY): I-stat hCG, quantitative: <5 m[IU]/mL (ref <5)

## 2019-09-23 LAB — PHOSPHORUS: Phosphorus: 3 mg/dL (ref 2.5–4.6)

## 2019-09-23 LAB — CBG MONITORING, ED: Glucose-Capillary: 172 mg/dL — ABNORMAL HIGH (ref 70–99)

## 2019-09-23 LAB — MAGNESIUM: Magnesium: 2 mg/dL (ref 1.7–2.4)

## 2019-09-23 LAB — GLUCOSE, CAPILLARY: Glucose-Capillary: 161 mg/dL — ABNORMAL HIGH (ref 70–99)

## 2019-09-23 IMAGING — MR MR MRA HEAD W/O CM
3 series · 17 of 48 positions shown · non-contrast
Comparison: Brain MRI earlier today.  CTA head and neck [DATE].

CLINICAL DATA: 53-year-old female with multifocal small acute
infarcts scattered in both cerebral hemispheres on MRI earlier
today.

EXAM:
MRA HEAD WITHOUT CONTRAST
TECHNIQUE: Angiographic images of the Circle of Willis were obtained using MRA
technique without intravenous contrast.

[Series 5: 3d cow · axial · 0.5mm · 0.41mm/px · z∈[-96,-4]mm · 15 of 208 slices shown]
[im 1/208]
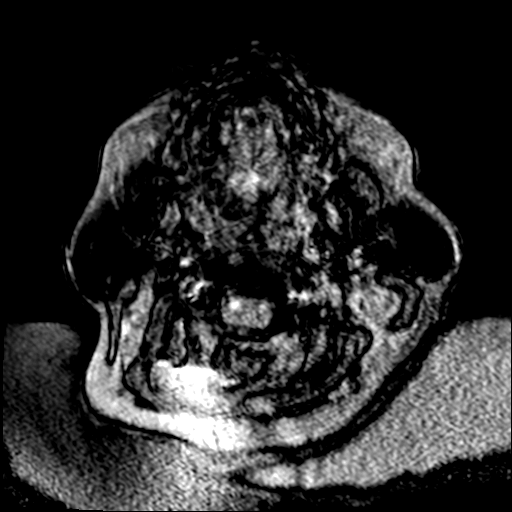
[im 5/208]
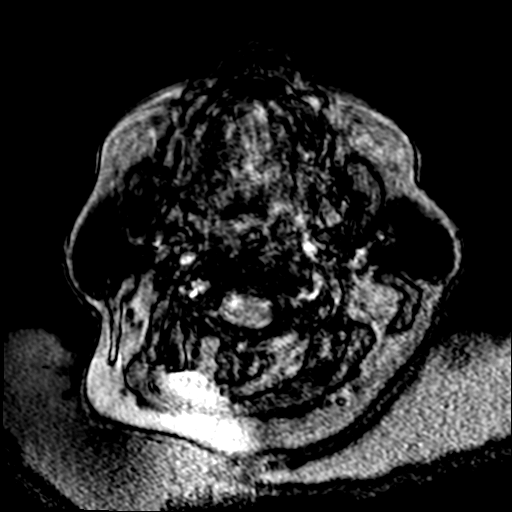
[im 10/208]
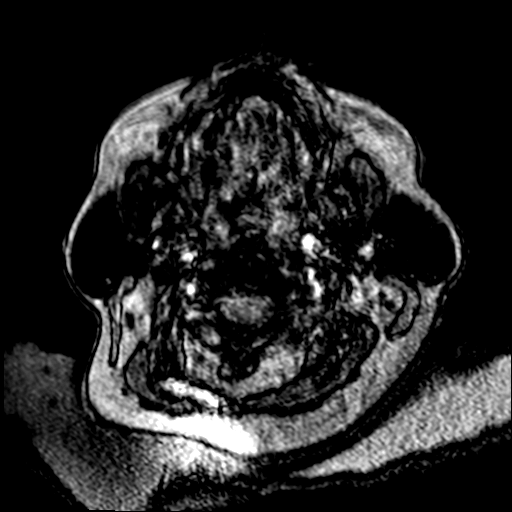
[im 14/208]
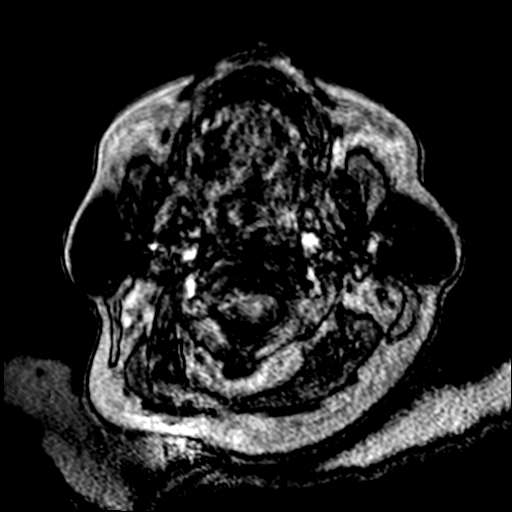
[im 19/208]
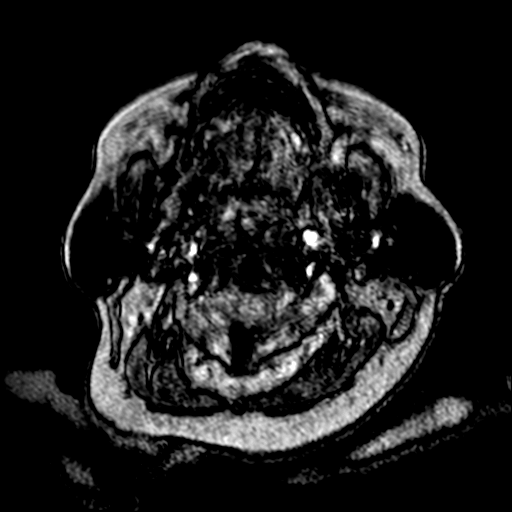
[im 33/208]
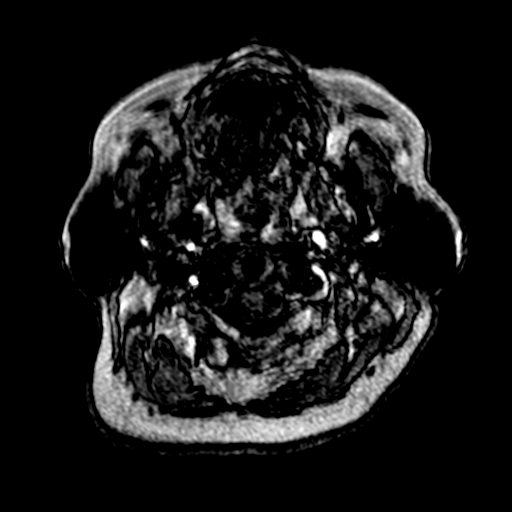
[im 37/208]
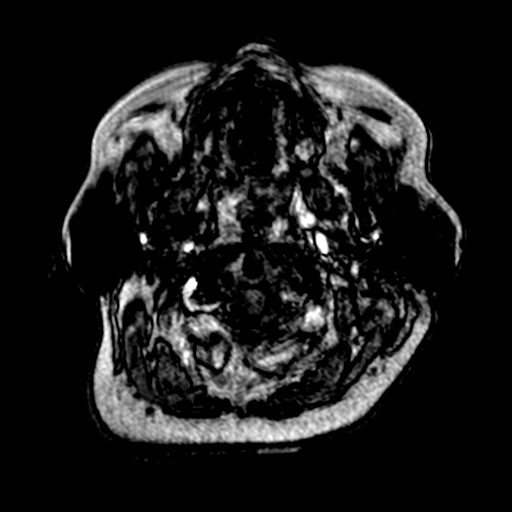
[im 65/208]
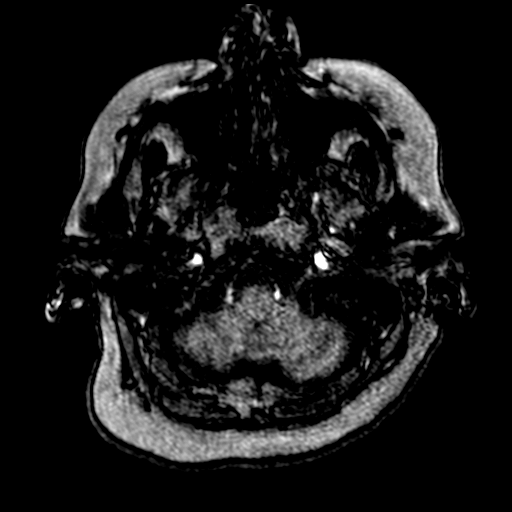
[im 93/208]
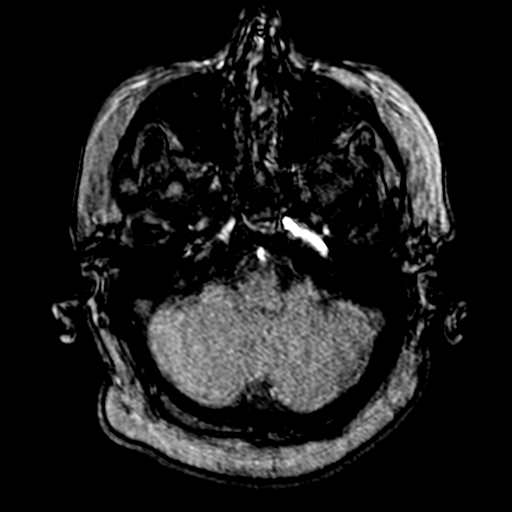
[im 106/208]
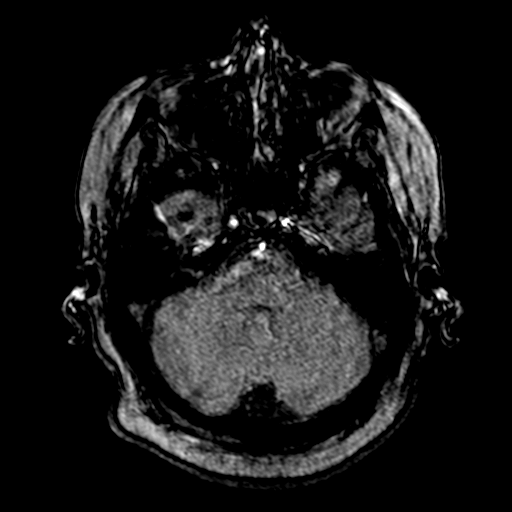
[im 115/208]
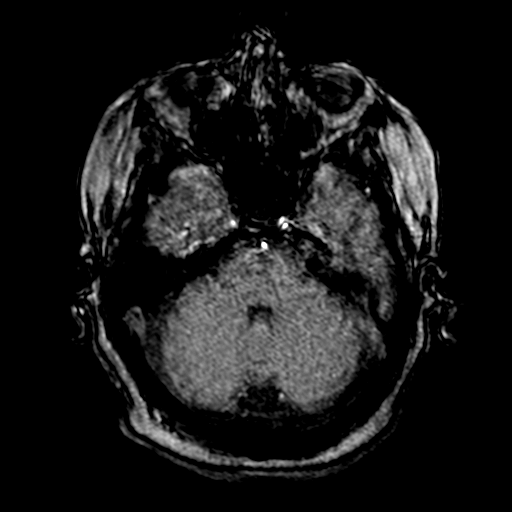
[im 143/208]
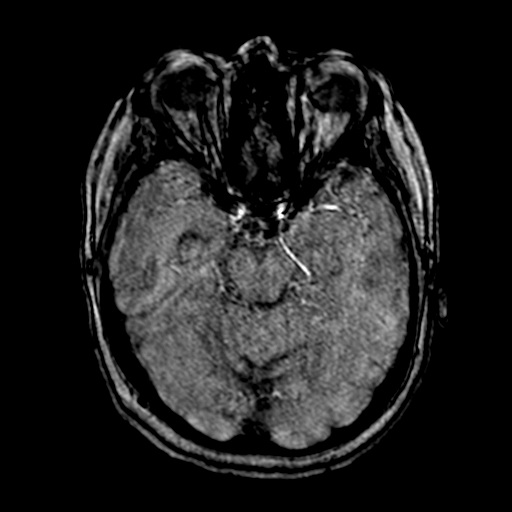
[im 171/208]
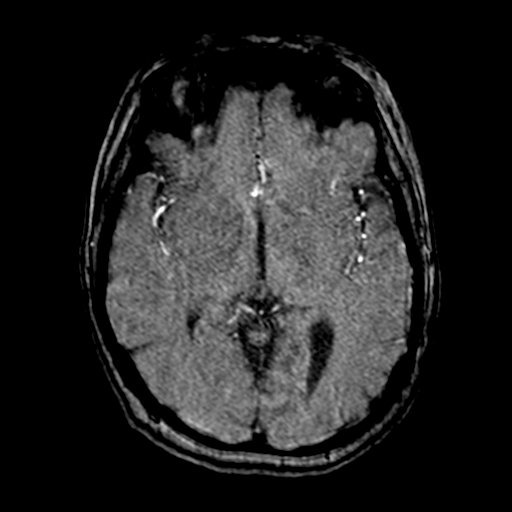
[im 175/208]
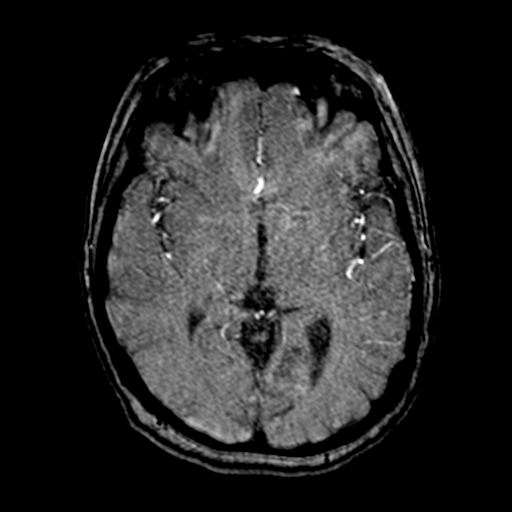
[im 198/208]
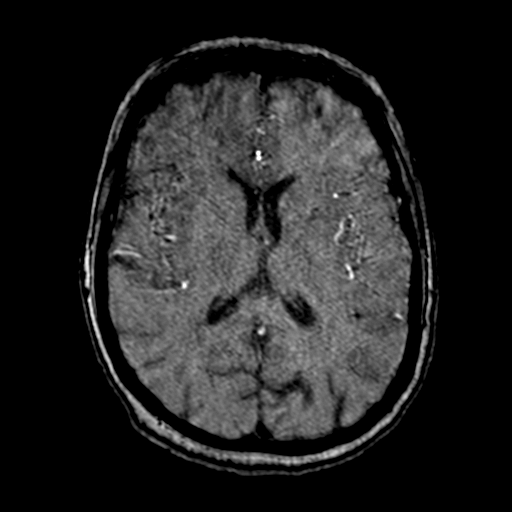

[Series 8: 3d cow_mip_tra · axial · 104.0mm · 0.41mm/px · 1 of 1 slices shown]
[im 1/1]
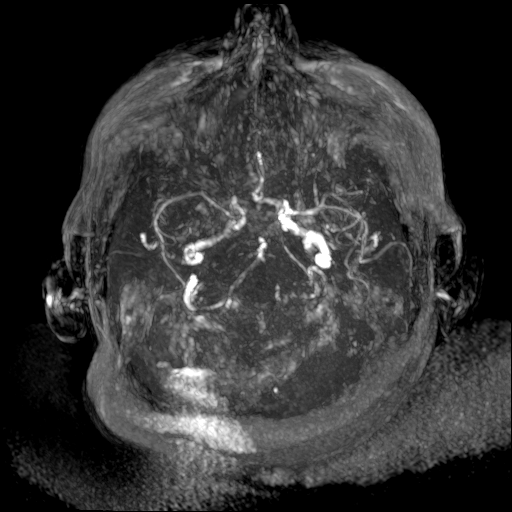

[Series 1045: mip tumble · axial · 0.4mm · 0.14mm/px · 1 of 4 slices shown]
[im 1/4]
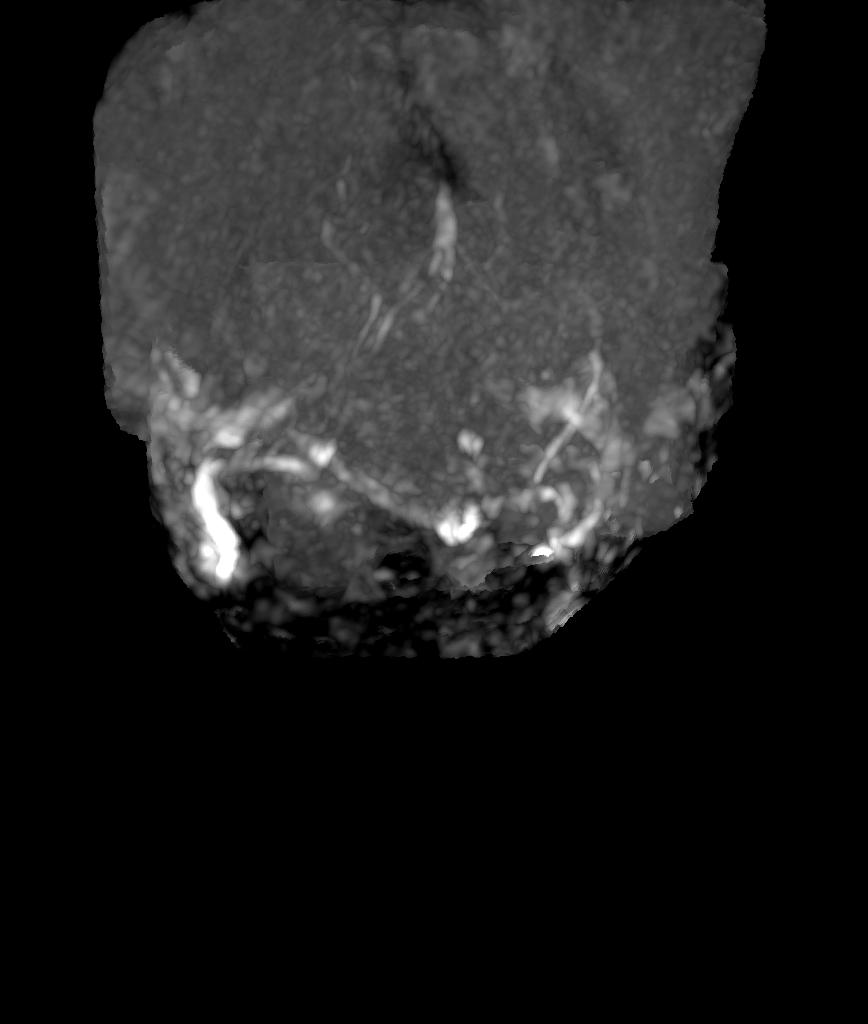

[17 of 48 positions shown; findings below may reference images not displayed]

FINDINGS: Study is moderately degraded by motion artifact despite repeated
imaging attempts.

The distal vertebral arteries and basilar artery appear patent with
preserved antegrade flow signal. Both ICA siphons and carotid
termini appear patent with preserved antegrade flow signal.

Fetal type bilateral PCA is demonstrated on the [facility] CTA. The
P1 and P2 segments appear to remain patent but PCA branches are
otherwise not evaluated.

MCA M1 segments and MCA bifurcations appear patent. No MCA branch
occlusion is evident.

ACA A1 and proximal A2 segments appear patent. The left A1 was
dominant on the prior CTA.
IMPRESSION: Motion degraded exam despite repeated imaging attempts. Negative for
large vessel occlusion.

## 2019-09-23 IMAGING — CT CT HEAD W/O CM
4 series · 16 of 47 positions shown, 18 images · non-contrast
Comparison: CT and MRI [DATE]

CLINICAL DATA: Concern for subacute stroke, dizziness since
[REDACTED] morning with gait disturbance

EXAM:
CT HEAD WITHOUT CONTRAST
TECHNIQUE: Contiguous axial images were obtained from the base of the skull
through the vertex without intravenous contrast.

[Series 3: head without · axial · non-contrast · 0.39mm/px · z∈[-189,-69]mm · 7 of 33 slices shown, 9 images]
[im 5/33  brain]
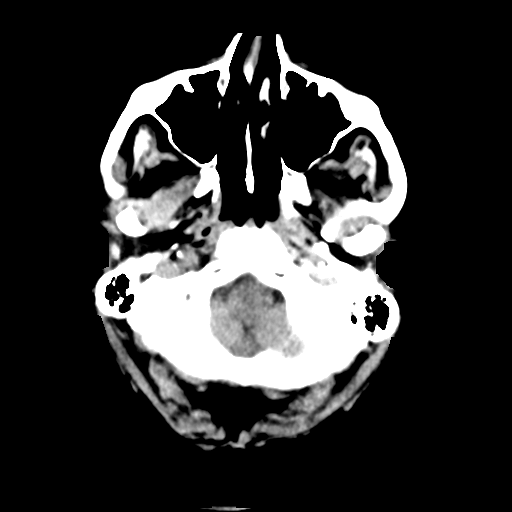
[im 5/33  bone]
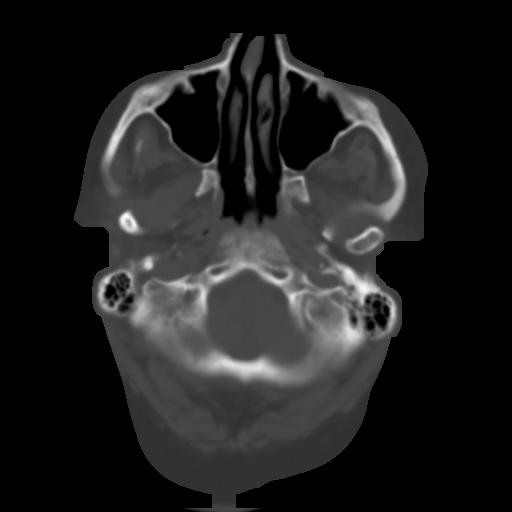
[im 9/33  brain]
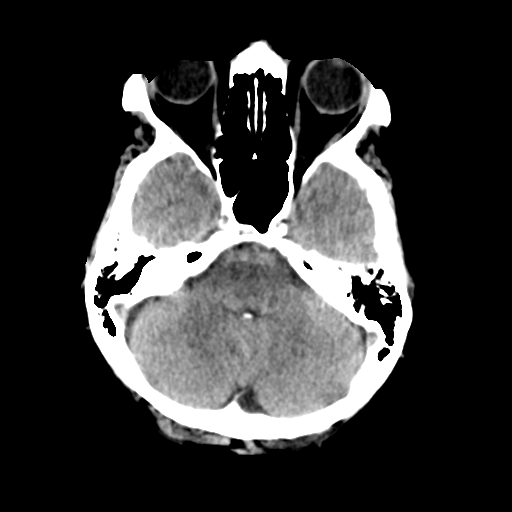
[im 13/33  brain]
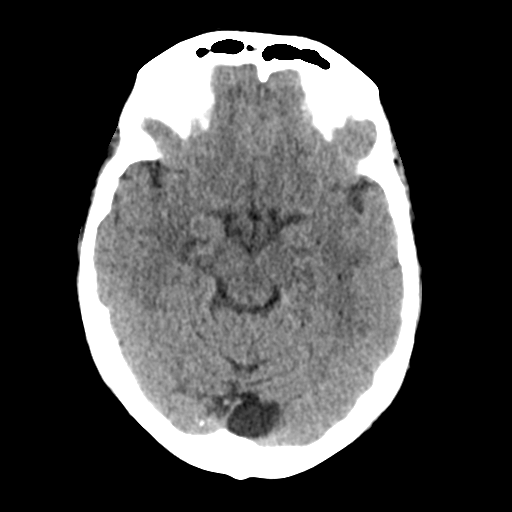
[im 17/33  brain]
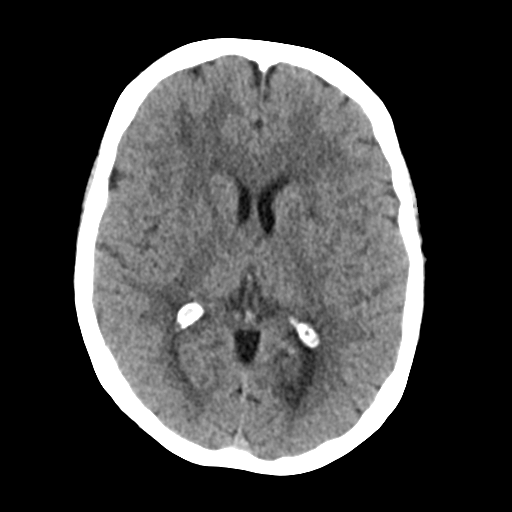
[im 21/33  brain]
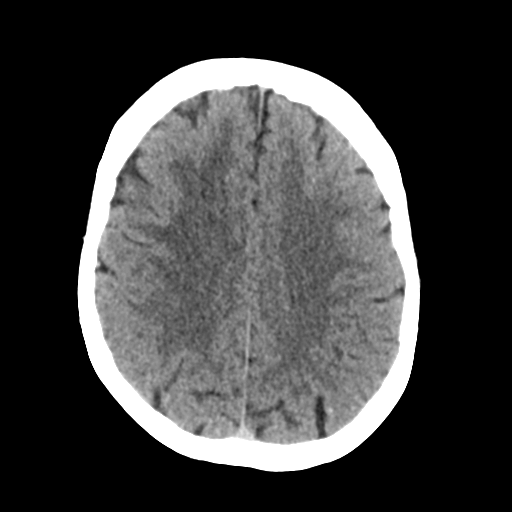
[im 21/33  bone]
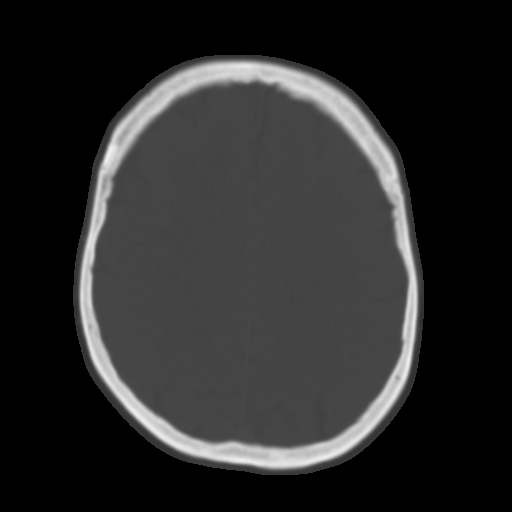
[im 25/33  brain]
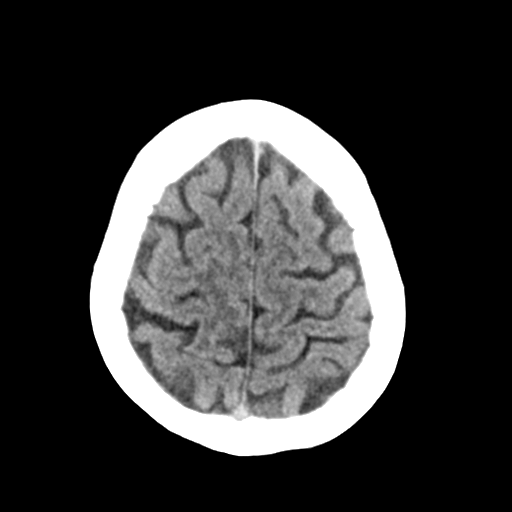
[im 29/33  brain]
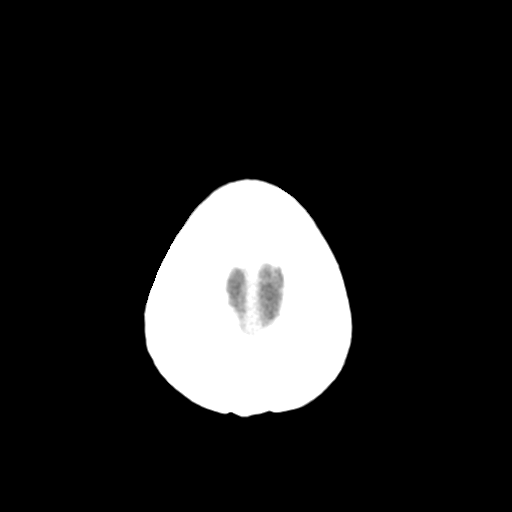

[Series 4: head bone · axial · 0.39mm/px · z∈[-193,-161]mm · 3 of 82 slices shown]
[im 9/82  bone]
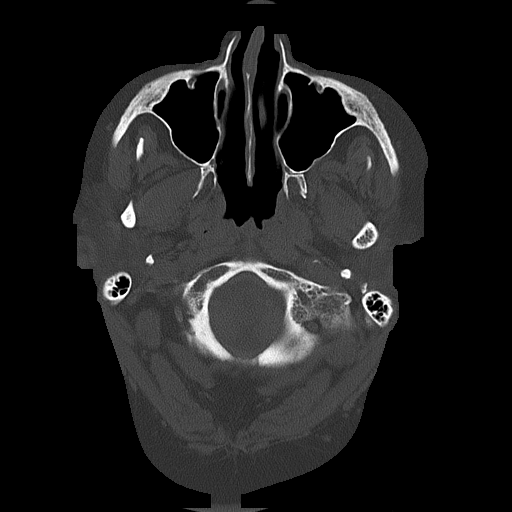
[im 17/82  bone]
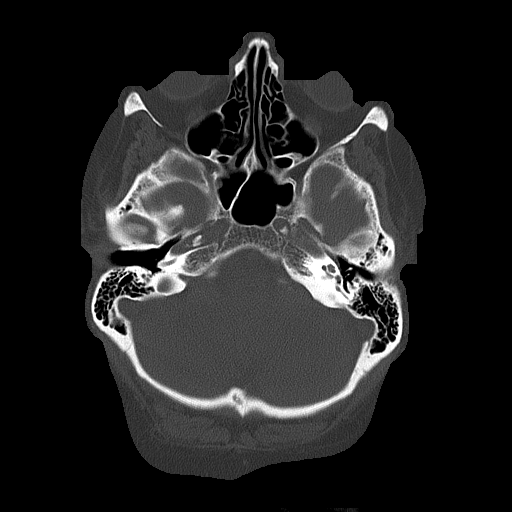
[im 25/82  bone]
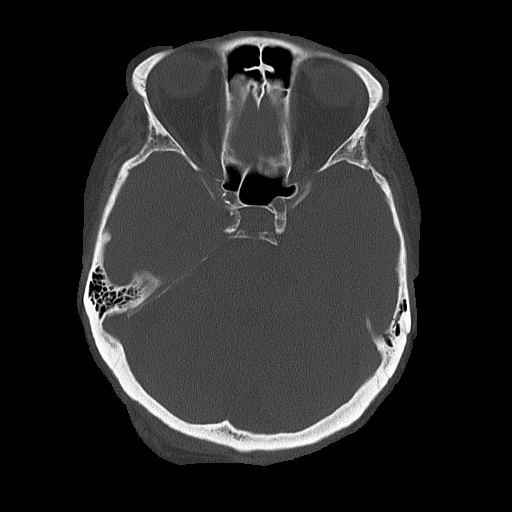

[Series 5: head without cor · coronal · non-contrast · 0.37mm/px · 3 of 69 slices shown]
[im 23/69  brain]
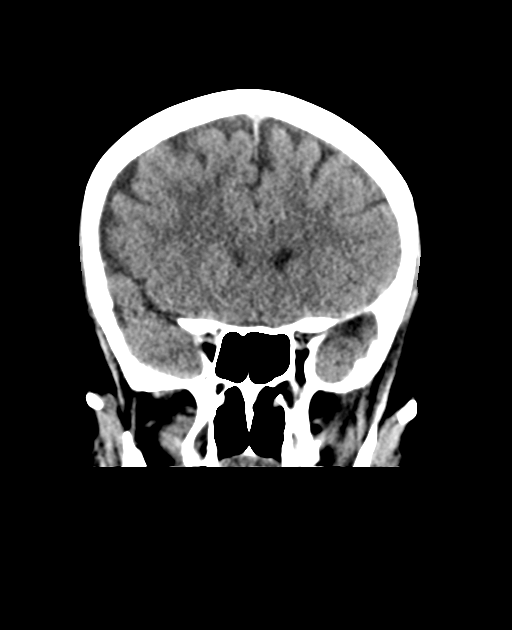
[im 31/69  brain]
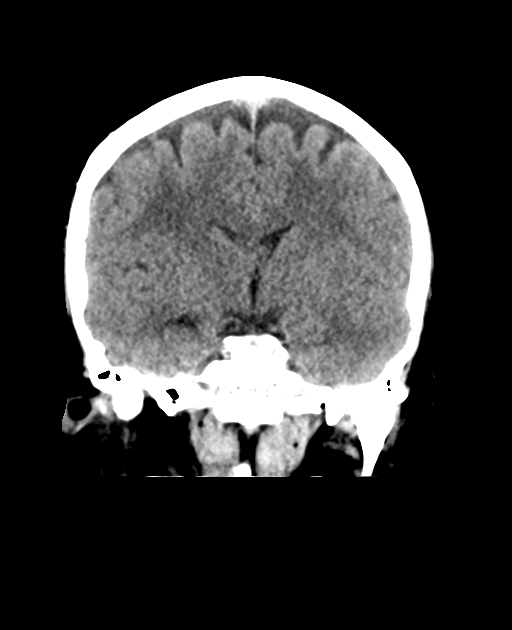
[im 38/69  brain]
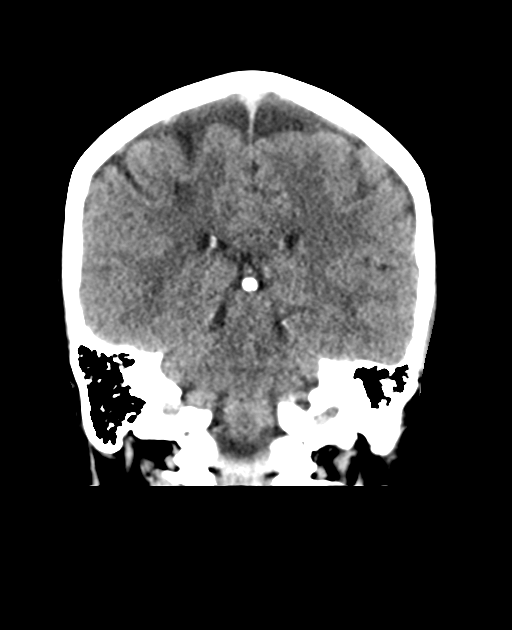

[Series 6: head without sag · sagittal · non-contrast · 0.38mm/px · 3 of 65 slices shown]
[im 22/65  brain]
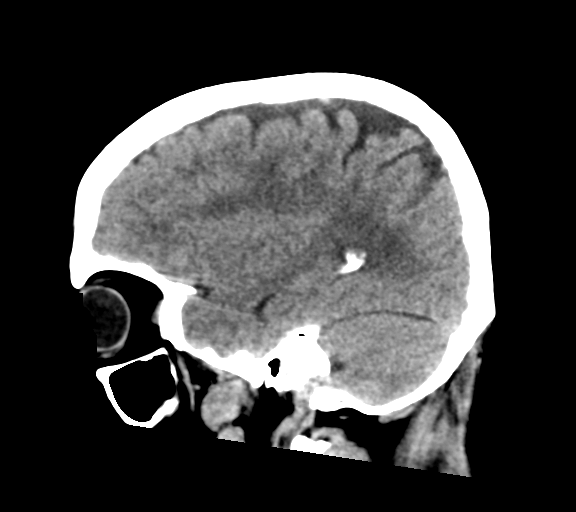
[im 33/65  brain]
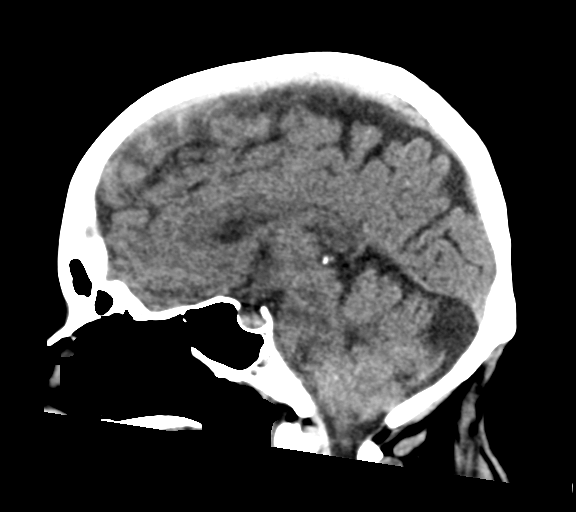
[im 43/65  brain]
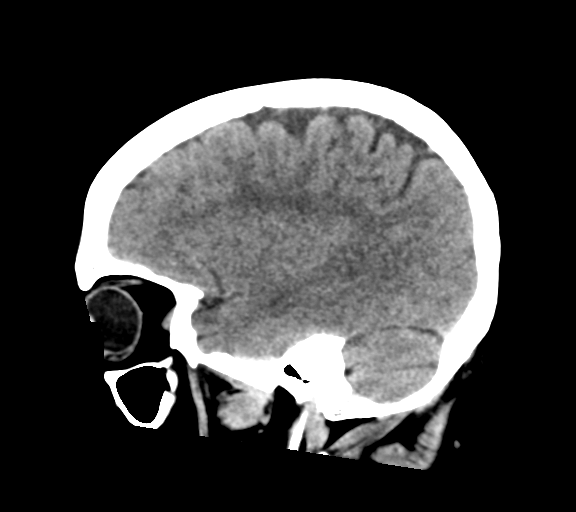

[16 of 47 positions shown; findings below may reference images not displayed]

FINDINGS: Brain: Stable appearance of chronic encephalomalacia in the left
occipital lobe. Additional focus hypoattenuation in the left corona
radiata likely reflecting sequela of prior infarct as well. No
convincing CT evidence of acute infarction, hemorrhage,
hydrocephalus, extra-axial collection or mass lesion/mass effect.
Symmetric prominence of the ventricles, cisterns and sulci
compatible with parenchymal volume loss. Patchy areas of white
matter hypoattenuation are most compatible with chronic
microvascular angiopathy.

Vascular: Atherosclerotic calcification of the carotid siphons and
intradural vertebral arteries. No hyperdense vessel.

Skull: No calvarial fracture or suspicious osseous lesion. No scalp
swelling or hematoma.

Sinuses/Orbits: Paranasal sinuses and mastoid air cells are
predominantly clear. Included orbital structures are unremarkable.

Other: None
IMPRESSION: 1. No CT evidence of acute intracranial abnormality. If there is
persisting concern for acute/subacute infarction, MRI is more
sensitive and specific for early features of ischemia.
2. Stable appearance of encephalomalacia in the left occipital lobe
and left corona radiata.
3. Stable parenchymal volume loss and chronic microvascular
angiopathy.

## 2019-09-23 IMAGING — MR MR HEAD W/O CM
13 of 14 series · 44 of 48 positions shown · non-contrast
Comparison: Prior head CT from earlier the same day as well as
previous brain MRI from [DATE].

CLINICAL DATA: Initial evaluation for acute ataxia, stroke
suspected.

EXAM:
MRI HEAD WITHOUT CONTRAST
TECHNIQUE: Multiplanar, multiecho pulse sequences of the brain and surrounding
structures were obtained without intravenous contrast.

[Series 5: DWI · axial · 3.0mm · 0.81mm/px · z∈[-80,+66]mm · 8 of 100 slices shown (1 of 4)]
[im 1/100]
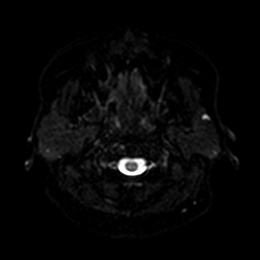
[im 15/100]
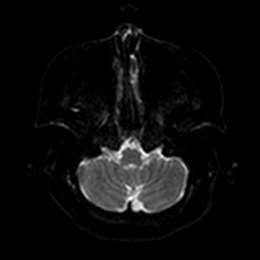
[im 29/100]
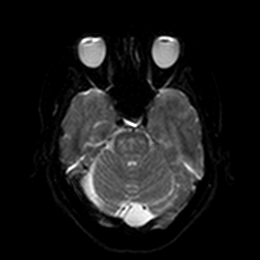
[im 43/100]
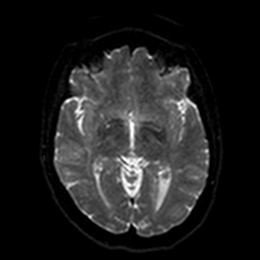
[im 57/100]
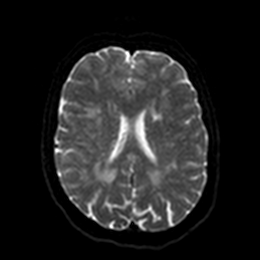
[im 71/100]
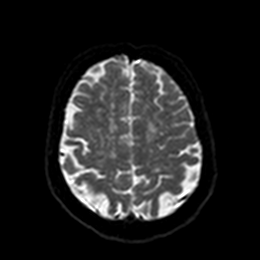
[im 85/100]
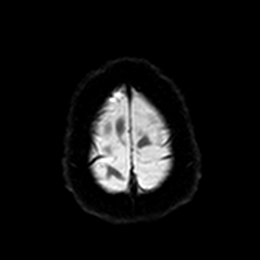
[im 100/100]
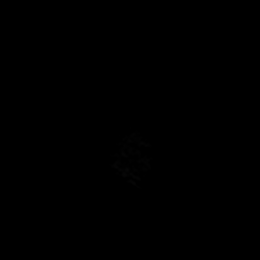

[Series 6: DWI · axial · 3.0mm · 0.81mm/px · z∈[-80,+66]mm · 4 of 50 slices shown (2 of 4)]
[im 1/50]
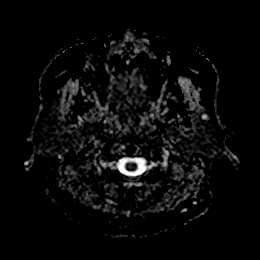
[im 17/50]
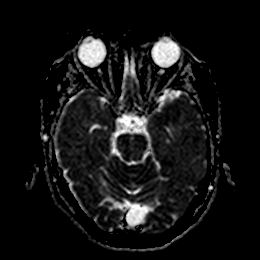
[im 33/50]
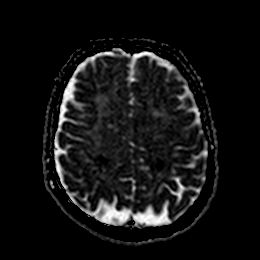
[im 50/50]
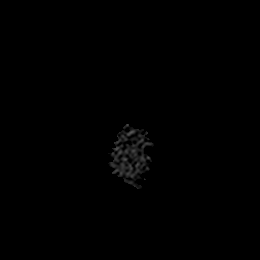

[Series 7: T1 · sagittal · 5.0mm · 0.69mm/px · 2 of 25 slices shown]
[im 1/25]
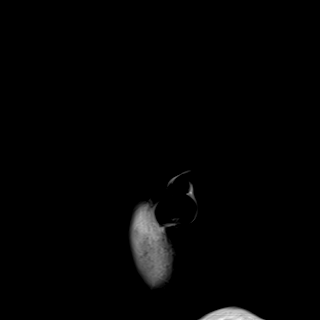
[im 25/25]
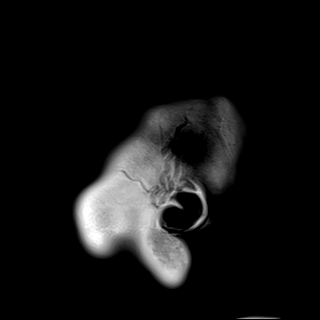

[Series 8: DWI · coronal · 4.0mm · 0.88mm/px · 5 of 66 slices shown (3 of 4)]
[im 1/66]
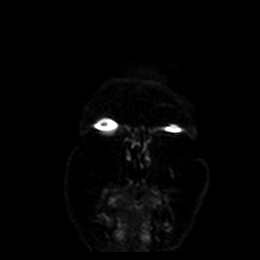
[im 17/66]
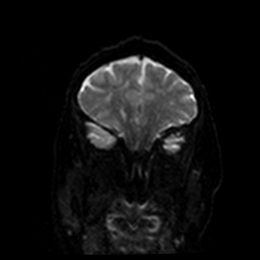
[im 33/66]
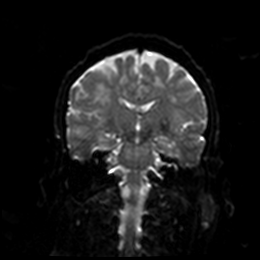
[im 49/66]
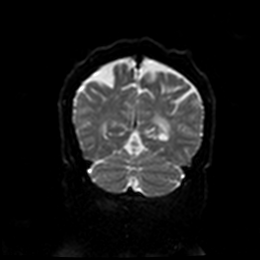
[im 66/66]
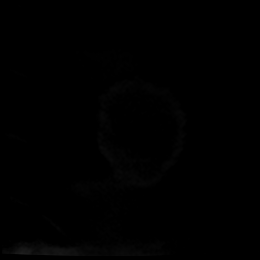

[Series 9: DWI · coronal · 4.0mm · 0.88mm/px · 2 of 32 slices shown (4 of 4)]
[im 1/32]
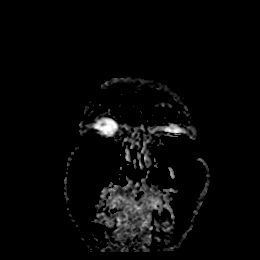
[im 32/32]
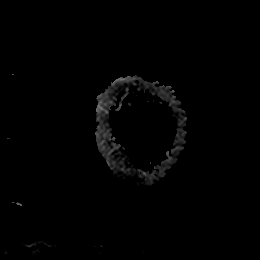

[Series 10: T2 · axial · 5.0mm · 0.66mm/px · z∈[-78,+65]mm · 2 of 25 slices shown (1 of 2)]
[im 1/25]
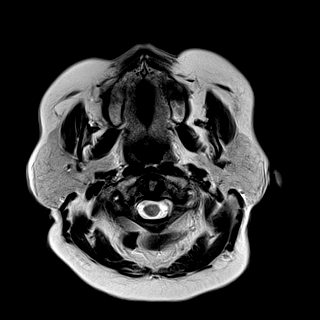
[im 25/25]
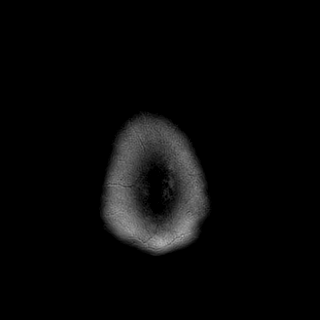

[Series 11: FLAIR · axial · 5.0mm · 0.82mm/px · z∈[-78,+65]mm · 2 of 25 slices shown]
[im 1/25]
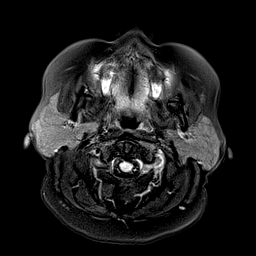
[im 25/25]
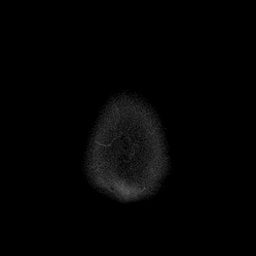

[Series 12: mag_images · axial · 3.0mm · 0.90mm/px · z∈[-82,+71]mm · 4 of 52 slices shown]
[im 1/52]
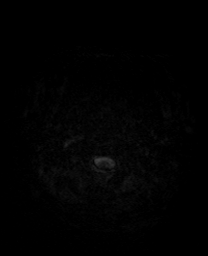
[im 18/52]
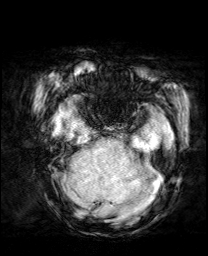
[im 35/52]
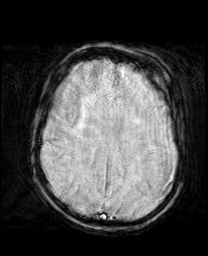
[im 52/52]
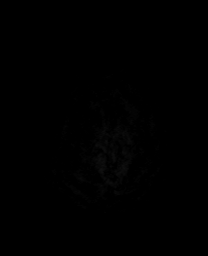

[Series 13: pha_images · axial · 3.0mm · 0.90mm/px · z∈[-79,+71]mm · 4 of 50 slices shown]
[im 1/50]
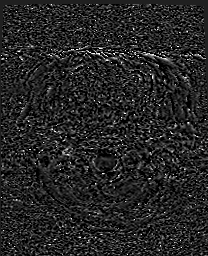
[im 17/50]
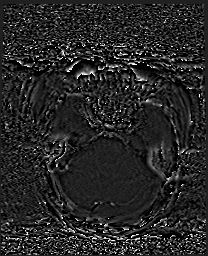
[im 33/50]
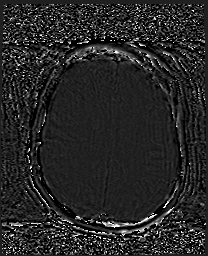
[im 50/50]
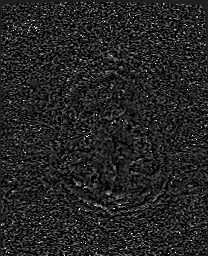

[Series 14: swi_images · axial · 3.0mm · 0.90mm/px · z∈[-82,+71]mm · 4 of 52 slices shown]
[im 1/52]
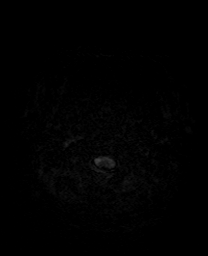
[im 18/52]
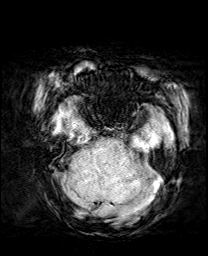
[im 35/52]
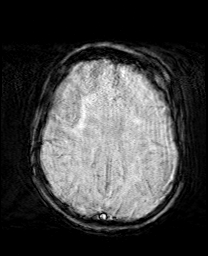
[im 52/52]
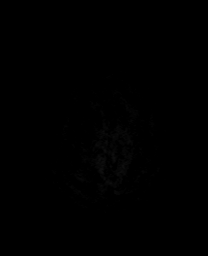

[Series 15: mip_images(sw) · axial · 24.0mm · 0.90mm/px · z∈[-71,+60]mm · 3 of 45 slices shown]
[im 1/45]
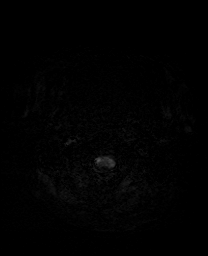
[im 23/45]
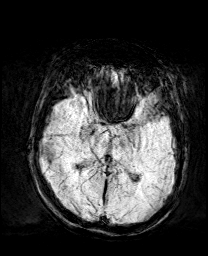
[im 45/45]
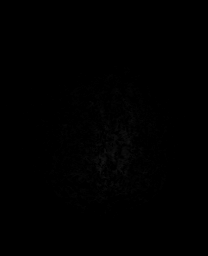

[Series 17: T2 · coronal · 5.0mm · 0.34mm/px · 2 of 29 slices shown (2 of 2)]
[im 1/29]
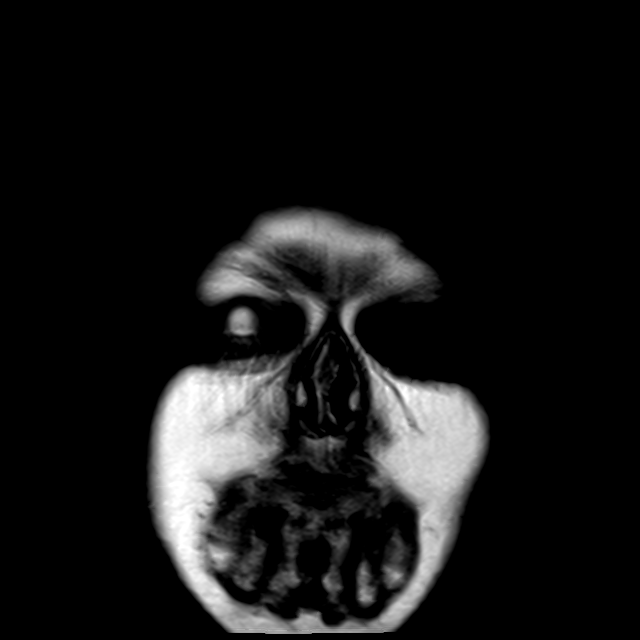
[im 29/29]
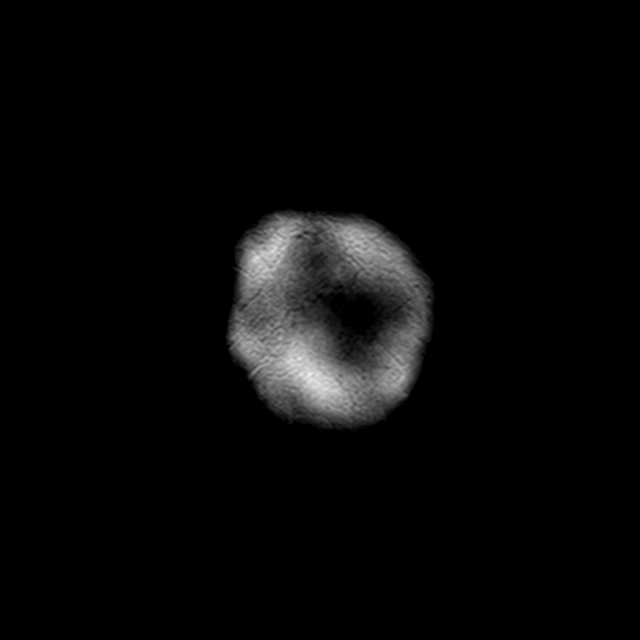

[Series 18: ax hemo · axial · 5.0mm · 0.82mm/px · z∈[-84,+59]mm · 2 of 25 slices shown]
[im 1/25]
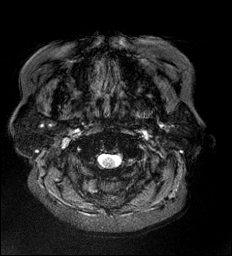
[im 25/25]
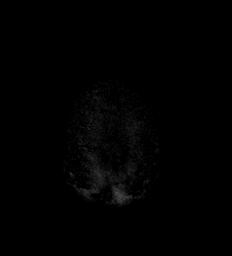

[44 of 48 positions shown; findings below may reference images not displayed]

FINDINGS: Brain: Examination moderately degraded by motion artifact.

Generalized age-related cerebral atrophy. Patchy and confluent
T2/FLAIR hyperintensity throughout the periventricular deep white
matter both cerebral hemispheres as well as the pons, consistent
with chronic small vessel ischemic disease, moderate nature. Remote
lacunar infarct present at the left basal ganglia/left corona
radiata. Additional small remote left parietal infarct, left PCA
territory.

Multifocal patchy foci of restricted diffusion seen involving the
cortical and subcortical white matter of both cerebral hemispheres,
most pronounced posteriorly (series 5, images 89, 88, [REDACTED], 74,
71). For reference purposes, largest area infarction seen at the
posterior left frontoparietal centrum semi ovale and measures 11 mm.
These foci are somewhat watershed in distribution, and could
potentially reflect a transient hypoperfusion injury. No associated
hemorrhage or mass effect.

No mass lesion, midline shift or mass effect. Ventricles normal size
without hydrocephalus. No extra-axial fluid collection. Pituitary
gland within normal limits. Midline structures intact.

Vascular: Major intracranial vascular flow voids are maintained.

Skull and upper cervical spine: Craniocervical junction within
normal limits. Upper cervical spine normal. Bone marrow signal
intensity within normal limits. No scalp soft tissue abnormality.

Sinuses/Orbits: Globes and orbital soft tissues within normal
limits. Paranasal sinuses are clear. No mastoid effusion.

Other: None.
IMPRESSION: 1. Multifocal patchy small volume acute ischemic nonhemorrhagic
infarcts involving the cortical and subcortical cerebral hemispheres
bilaterally, most pronounced posteriorly. Several of these foci are
somewhat watershed in distribution, and could reflect a transient
hypoperfusion injury. Correlation with history recommended.
2. Underlying age-related cerebral atrophy with moderate chronic
small vessel ischemic disease, with remote left PCA and left basal
ganglia infarcts.

## 2019-09-23 MED ORDER — SODIUM CHLORIDE 0.9% FLUSH
3.0000 mL | Freq: Once | INTRAVENOUS | Status: AC
Start: 2019-09-23 — End: 2019-09-23
  Administered 2019-09-23: 3 mL via INTRAVENOUS

## 2019-09-23 MED ORDER — POTASSIUM CHLORIDE 10 MEQ/100ML IV SOLN
10.0000 meq | INTRAVENOUS | Status: AC
  Administered 2019-09-23 (×5): 10 meq via INTRAVENOUS
  Filled 2019-09-23 (×2): qty 100

## 2019-09-23 MED ORDER — LORAZEPAM 2 MG/ML IJ SOLN
1.0000 mg | Freq: Once | INTRAMUSCULAR | Status: AC | PRN
  Administered 2019-09-23: 1 mg via INTRAVENOUS
  Filled 2019-09-23: qty 1

## 2019-09-23 MED ORDER — DULOXETINE HCL 60 MG PO CPEP
60.0000 mg | ORAL_CAPSULE | Freq: Every day | ORAL | Status: DC
  Administered 2019-09-23 – 2019-09-25 (×3): 60 mg via ORAL
  Filled 2019-09-23 (×3): qty 1

## 2019-09-23 MED ORDER — ASPIRIN 81 MG PO CHEW
81.0000 mg | CHEWABLE_TABLET | Freq: Every day | ORAL | Status: DC
  Administered 2019-09-23 – 2019-09-25 (×3): 81 mg via ORAL
  Filled 2019-09-23 (×3): qty 1

## 2019-09-23 MED ORDER — POTASSIUM CHLORIDE CRYS ER 20 MEQ PO TBCR
40.0000 meq | EXTENDED_RELEASE_TABLET | Freq: Once | ORAL | Status: AC
  Administered 2019-09-23: 40 meq via ORAL
  Filled 2019-09-23: qty 2

## 2019-09-23 MED ORDER — INSULIN GLARGINE 100 UNIT/ML ~~LOC~~ SOLN
7.0000 [IU] | Freq: Every day | SUBCUTANEOUS | Status: DC
  Administered 2019-09-23 – 2019-09-24 (×2): 7 [IU] via SUBCUTANEOUS
  Filled 2019-09-23 (×4): qty 0.07

## 2019-09-23 MED ORDER — LORAZEPAM 2 MG/ML IJ SOLN
0.5000 mg | Freq: Once | INTRAMUSCULAR | Status: AC
  Administered 2019-09-23: 0.5 mg via INTRAVENOUS
  Filled 2019-09-23: qty 1

## 2019-09-23 MED ORDER — POTASSIUM CHLORIDE CRYS ER 20 MEQ PO TBCR
40.0000 meq | EXTENDED_RELEASE_TABLET | Freq: Two times a day (BID) | ORAL | Status: AC
  Administered 2019-09-23 – 2019-09-24 (×2): 40 meq via ORAL
  Filled 2019-09-23 (×2): qty 2

## 2019-09-23 MED ORDER — LACTATED RINGERS IV BOLUS
1000.0000 mL | Freq: Once | INTRAVENOUS | Status: AC
  Administered 2019-09-23: 1000 mL via INTRAVENOUS

## 2019-09-23 MED ORDER — ATORVASTATIN CALCIUM 80 MG PO TABS
80.0000 mg | ORAL_TABLET | Freq: Every day | ORAL | Status: DC
  Administered 2019-09-23 – 2019-09-24 (×2): 80 mg via ORAL
  Filled 2019-09-23 (×2): qty 1

## 2019-09-23 MED ORDER — MIDODRINE HCL 5 MG PO TABS
5.0000 mg | ORAL_TABLET | Freq: Three times a day (TID) | ORAL | Status: DC
  Administered 2019-09-23 – 2019-09-24 (×4): 5 mg via ORAL
  Filled 2019-09-23 (×4): qty 1

## 2019-09-23 MED ORDER — LACTATED RINGERS IV SOLN: INTRAVENOUS | Status: DC

## 2019-09-23 MED ORDER — POTASSIUM CHLORIDE 10 MEQ/100ML IV SOLN
10.0000 meq | Freq: Once | INTRAVENOUS | Status: AC
  Administered 2019-09-23: 10 meq via INTRAVENOUS
  Filled 2019-09-23: qty 100

## 2019-09-23 MED ORDER — INSULIN ASPART 100 UNIT/ML ~~LOC~~ SOLN
0.0000 [IU] | Freq: Three times a day (TID) | SUBCUTANEOUS | Status: DC
  Administered 2019-09-23 (×2): 3 [IU] via SUBCUTANEOUS
  Administered 2019-09-24 (×2): 2 [IU] via SUBCUTANEOUS
  Administered 2019-09-24: 5 [IU] via SUBCUTANEOUS
  Administered 2019-09-25: 3 [IU] via SUBCUTANEOUS
  Administered 2019-09-25: 2 [IU] via SUBCUTANEOUS

## 2019-09-23 MED ORDER — SODIUM CHLORIDE 0.9 % IV BOLUS
1000.0000 mL | Freq: Once | INTRAVENOUS | Status: AC
  Administered 2019-09-23: 1000 mL via INTRAVENOUS

## 2019-09-23 NOTE — Discharge Summary (Addendum)
Name: Pamela Carney MRN: [id number] DOB: 19-Apr-1965 54 y.o. PCP: Pamela Pounds, NP  Date of Admission: 09/23/2019 12:39 AM Date of Discharge: 09/25/19 Attending Physician: Pamela Kilts, MD  Discharge Diagnosis: 1. Ischemic stroke 2. Chronic mesenteric ischemia 3. Hypokalemia 4. Left internal carotid artery stenosis, 80-99%  Discharge Medications: Allergies as of 09/25/2019       Reactions   Sulfa Antibiotics Hives, Swelling   Swelling site not recalled   Latex Rash   Tape Rash   Not tolerated well        Medication List     STOP taking these medications    indapamide 2.5 MG tablet Commonly known as: LOZOL   potassium chloride SA 20 MEQ tablet Commonly known as: KLOR-CON       TAKE these medications    aspirin 81 MG chewable tablet Chew 1 tablet (81 mg total) by mouth daily. Start taking on: September 26, 2019   atorvastatin 80 MG tablet Commonly known as: LIPITOR Take 1 tablet (80 mg total) by mouth daily at 6 PM.   AZO CRANBERRY GUMMIES PO Take 2 tablets by mouth daily.   cetirizine 10 MG tablet Commonly known as: ZYRTEC Take 1 tablet (10 mg total) by mouth daily.   DULoxetine 60 MG capsule Commonly known as: CYMBALTA TAKE 1 CAPSULE BY MOUTH DAILY   fluconazole 150 MG tablet Commonly known as: DIFLUCAN Take 150 mg by mouth once.   fluticasone 50 MCG/ACT nasal spray Commonly known as: Flonase Place 1 spray into both nostrils daily.   ibuprofen 200 MG tablet Commonly known as: ADVIL Take 200 mg by mouth every 6 (six) hours as needed for moderate pain.   insulin glargine 100 UNIT/ML Solostar Pen Commonly known as: LANTUS Inject 15 Units into the skin at bedtime. What changed: how much to take   ondansetron 4 MG tablet Commonly known as: Zofran Take 1 tablet (4 mg total) by mouth every 8 (eight) hours as needed for nausea or vomiting.   ONE TOUCH LANCETS Misc Use to check blood sugars every morning fasting and 2 hours after largest  meal What changed:  how much to take how to take this when to take this   One-A-Day Womens 50+ Advantage Tabs Take 1 tablet by mouth daily.   OneTouch Verio test strip Generic drug: glucose blood Use to check blood sugars every morning fasting and 2 hours after largest meal What changed:  how much to take how to take this when to take this   OneTouch Verio w/Device Kit Use to check blood sugars every morning fasting and 2 hours after largest meal What changed:  how much to take how to take this when to take this   Pen Needles 31G X 8 MM Misc Use as instructed. Inject into the skin once nightly. What changed:  how much to take how to take this when to take this   ticagrelor 90 MG Tabs tablet Commonly known as: BRILINTA Take 2 tablets (180 mg total) by mouth once for 1 dose.   ticagrelor 90 MG Tabs tablet Commonly known as: BRILINTA Take 1 tablet (90 mg total) by mouth 2 (two) times daily.   topiramate 100 MG tablet Commonly known as: Topamax Take 1 tablet (100 mg total) by mouth 2 (two) times daily. What changed: how much to take        Disposition and follow-up:   Ms.Pamela Carney was discharged from [facility] in Good condition.  At the hospital follow up visit please address:  1.  Follow up  A. Stroke - MRI with multifocal stroke. Etiology most likely embolic, but workup has not revealed clear explanation.  Follow up on the outstanding labs, described below.  B. Chronic Mesenteric Ischemia - 4 months of chronic diarrhea and abdominal pain. CT abd/pelvis demonstrated extensive atherosclerosis involving SMA and IMA. Continue ASA and Lipitor. Counseled on smoking cessation. A1c 7.5% this admission. Perhaps can be addressed during vascular surgery f/u.  C. Hypokalemia - 2.7 on admission. No arrhythmias on EKG. Likely 2/2 chronic diarrhea and Imipramine use. Imipramine stopped.  D. Hypotension - Likely 2/2 chronic diarrhea and Imipramine  use. Imipramine stopped.  E. L ICA stenosis - 80-99% stenosis. Does not explain this patient's presenting distribution of strokes. Consider elective CEA outpatient.  2.  Labs / imaging needed at time of follow-up: BMP, A1c  3.  Pending labs/ test needing follow-up:           Stroke: homocysteine, ANA, cardiolipin, prothrombin gene mutation, factor 5 leiden, beta-2-glycoprotein, lupus anticoagulant, protein S, protein C                  Diarrhea: pancreatic elastase, lactoferrin, ova and parasite  Follow-up Appointments:  Follow-up Information     [facility] Follow up.   Specialty: Home Health Services Why: Your home health PT has been set up with [facility]. The agency will call you with the details of the start of service. If you have any questions or concerns please call number listed above.  Contact information: [address] [place] [address] [phone]                Hospital Course by problem list: 1. Stroke - Patient presented with 3 days of dizziness and generalized weakness in the setting of previous cryptogenic stroke, chronic diarrhea and hypotension. She presented outside the window for clot lysis. CT head without contrast was negative for acute bleed. MRA demonstrated no LVO. MRI brain revealed multifocal patchy small volume acute ischemic nonhemorrhagic infarcts involving the cortical and subcortical cerebral hemispheres bilaterally, most pronounced posteriorly. LE doppler without DVTs. Carotid US with L ICA 80-99% stenosis. Echo with bubble study with normal EF and no PFO. Loop from her prior stroke was interrogated and no A fib had been detected. Neurology evaluated the patient and deemed TEE unnecessary given prior TEE. Per their recommendation, she has failed ASA + Plavix so we started her on ASA + Brilinta. She was given a loading dose of 179m, then started on 943mBID for 30 days. Other stroke workup includes hypercoagulability panel  and autoimmune labs. These are largely still pending at time of discharge. Given the distribution, her stroke is thought to be most likely embolic, though no source has been found. PT recommended home PT, OT does not recommend further OT.  2. Chronic diarrhea/hypokalemia/hypotension -  Patient presented with K of 2.7 and history of 4 months of diarrhea 4 times per day. Prior to this, she had constipation with GI workup including colonoscopy in Feb 2021 without explanation. Then in April 2021 was admitted for diarrhea and pre-renal azotemia, treated with IVF and Rocephin for UTI, and has since then had this diarrhea. PCP stopped her metformin, but diarrhea has continued. She is also on Indapamide for recurrent kidney stones which likely contributed to her hypokalemia and hypotension. On admission, her Indapamide was discontinued and she was given IVF and K replacement. BP 121/80 at  time of discharge, and most recent K was 3.5. As far as her diarrhea workup, GI panel was negative. Stool studies still pending. CT abdomen was significant for extensive atherosclerosis including splanchnic, renal, SMA, and IMA arteries consistent with chronic mesenteric ischemia. Discussed with patient to continue her ASA, Lipitor, smoking cessation, and eating frequent small meals. When she follows up with vascular, they can discuss possible interventions for her chronic mesenteric ischemia.  Discharge Vitals:   BP 121/80 (BP Location: Right Arm)   Pulse 63   Temp 98.6 F (37 C)   Resp 14   Ht 5' 2" (1.575 m)   Wt 95.9 kg   SpO2 92%   BMI 38.67 kg/m   Pertinent Labs, Studies, and Procedures:  CT HEAD WO CONTRAST  Result Date: 09/23/2019 CLINICAL DATA:  Concern for subacute stroke, dizziness since Thursday morning with gait disturbance EXAM: CT HEAD WITHOUT CONTRAST TECHNIQUE: Contiguous axial images were obtained from the base of the skull through the vertex without intravenous contrast. COMPARISON:  CT and MRI  04/27/2019 FINDINGS: Brain: Stable appearance of chronic encephalomalacia in the left occipital lobe. Additional focus hypoattenuation in the left corona radiata likely reflecting sequela of prior infarct as well. No convincing CT evidence of acute infarction, hemorrhage, hydrocephalus, extra-axial collection or mass lesion/mass effect. Symmetric prominence of the ventricles, cisterns and sulci compatible with parenchymal volume loss. Patchy areas of white matter hypoattenuation are most compatible with chronic microvascular angiopathy. Vascular: Atherosclerotic calcification of the carotid siphons and intradural vertebral arteries. No hyperdense vessel. Skull: No calvarial fracture or suspicious osseous lesion. No scalp swelling or hematoma. Sinuses/Orbits: Paranasal sinuses and mastoid air cells are predominantly clear. Included orbital structures are unremarkable. Other: None IMPRESSION: 1. No CT evidence of acute intracranial abnormality. If there is persisting concern for acute/subacute infarction, MRI is more sensitive and specific for early features of ischemia. 2. Stable appearance of encephalomalacia in the left occipital lobe and left corona radiata. 3. Stable parenchymal volume loss and chronic microvascular angiopathy. Electronically Signed   By: Lovena Le M.D.   On: 09/23/2019 01:38   MR ANGIO HEAD WO CONTRAST  Result Date: 09/23/2019 CLINICAL DATA:  54 year old female with multifocal small acute infarcts scattered in both cerebral hemispheres on MRI earlier today. EXAM: MRA HEAD WITHOUT CONTRAST TECHNIQUE: Angiographic images of the Circle of Willis were obtained using MRA technique without intravenous contrast. COMPARISON:  Brain MRI earlier today.  CTA head and neck 12/21/2018. FINDINGS: Study is moderately degraded by motion artifact despite repeated imaging attempts. The distal vertebral arteries and basilar artery appear patent with preserved antegrade flow signal. Both ICA siphons and  carotid termini appear patent with preserved antegrade flow signal. Fetal type bilateral PCA is demonstrated on the September CTA. The P1 and P2 segments appear to remain patent but PCA branches are otherwise not evaluated. MCA M1 segments and MCA bifurcations appear patent. No MCA branch occlusion is evident. ACA A1 and proximal A2 segments appear patent. The left A1 was dominant on the prior CTA. IMPRESSION: Motion degraded exam despite repeated imaging attempts. Negative for large vessel occlusion. Electronically Signed   By: Genevie Ann M.D.   On: 09/23/2019 13:55   MR BRAIN WO CONTRAST  Result Date: 09/23/2019 CLINICAL DATA:  Initial evaluation for acute ataxia, stroke suspected. EXAM: MRI HEAD WITHOUT CONTRAST TECHNIQUE: Multiplanar, multiecho pulse sequences of the brain and surrounding structures were obtained without intravenous contrast. COMPARISON:  Prior head CT from earlier the same day as well as previous  brain MRI from 04/27/2019. FINDINGS: Brain: Examination moderately degraded by motion artifact. Generalized age-related cerebral atrophy. Patchy and confluent T2/FLAIR hyperintensity throughout the periventricular deep white matter both cerebral hemispheres as well as the pons, consistent with chronic small vessel ischemic disease, moderate nature. Remote lacunar infarct present at the left basal ganglia/left corona radiata. Additional small remote left parietal infarct, left PCA territory. Multifocal patchy foci of restricted diffusion seen involving the cortical and subcortical white matter of both cerebral hemispheres, most pronounced posteriorly (series 5, images 89, 88, 84 82 79, 74, 71). For reference purposes, largest area infarction seen at the posterior left frontoparietal centrum semi ovale and measures 11 mm. These foci are somewhat watershed in distribution, and could potentially reflect a transient hypoperfusion injury. No associated hemorrhage or mass effect. No mass lesion, midline shift  or mass effect. Ventricles normal size without hydrocephalus. No extra-axial fluid collection. Pituitary gland within normal limits. Midline structures intact. Vascular: Major intracranial vascular flow voids are maintained. Skull and upper cervical spine: Craniocervical junction within normal limits. Upper cervical spine normal. Bone marrow signal intensity within normal limits. No scalp soft tissue abnormality. Sinuses/Orbits: Globes and orbital soft tissues within normal limits. Paranasal sinuses are clear. No mastoid effusion. Other: None. IMPRESSION: 1. Multifocal patchy small volume acute ischemic nonhemorrhagic infarcts involving the cortical and subcortical cerebral hemispheres bilaterally, most pronounced posteriorly. Several of these foci are somewhat watershed in distribution, and could reflect a transient hypoperfusion injury. Correlation with history recommended. 2. 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 Electronically Signed   By: Jeannine Boga M.D.   On: 09/23/2019 05:47   CT ABDOMEN PELVIS W CONTRAST  Result Date: 09/24/2019 CLINICAL DATA:  Right lower quadrant pain, several months of diarrhea EXAM: CT ABDOMEN AND PELVIS WITH CONTRAST TECHNIQUE: Multidetector CT imaging of the abdomen and pelvis was performed using the standard protocol following bolus administration of intravenous contrast. CONTRAST:  146m OMNIPAQUE IOHEXOL 300 MG/ML  SOLN COMPARISON:  CT 12/15/2018 FINDINGS: Lower chest: Bandlike areas of scarring and/or atelectasis in the otherwise clear lung bases. Normal heart size. No pericardial effusion. Coronary artery calcifications are present. Few calcifications of the aortic leaflets as well. Hepatobiliary: No worrisome focal liver abnormality is seen. Normal gallbladder. No visible calcified gallstones. No biliary ductal dilatation. Pancreas: Unremarkable. No pancreatic  ductal dilatation or surrounding inflammatory changes. Spleen: Normal in size without focal abnormality. Adrenals/Urinary Tract: Stable size of a 3.1 cm right adrenal nodule measuring less than 10 Hounsfield units on comparison noncontrast CT exams and most compatible with a benign adenoma. No other concerning adrenal nodules. Kidneys enhance and excrete symmetrically. Slightly asymmetric right renal atrophy with some upper pole scarring. Additional scarring in the lower pole left kidney. No obstructing urolithiasis or hydronephrosis. Few scattered nonobstructive collecting system calcifications, largest in the interpolar and lower pole left kidney, are similar to the comparison CT. No worrisome renal lesions. Scattered subcentimeter hypertension foci in both kidneys, left more numerous than right, too small to fully characterize on CT imaging but statistically likely benign. The urinary bladder is largely decompressed at the time of exam and therefore poorly evaluated by CT imaging. No gross bladder abnormality. Stomach/Bowel: Distal esophagus, stomach and duodenal sweep are unremarkable. No small bowel wall thickening or dilatation. No evidence of obstruction. A normal appendix is visualized. No colonic dilatation or frank wall thickening though much of the colon is decompressed distally. Scattered colonic diverticula without focal  inflammation to suggest diverticulitis. Vascular/Lymphatic: There is extensive calcified noncalcified atheromatous plaque throughout the abdominal aorta and branch vessels. Suspected mild-to-moderate narrowing at the celiac axis with more moderate to high-grade stenosis at the ostia of the SMA and IMA. Vessels are opacified distally possibly augmented by collateral flow. Additional renal artery stenoses are likely present as well. Additional narrowing is suspected at the iliac artery origins as well. These findings are incompletely assessed on this non angiographic technique. Mild  fusiform ectasia of the right common iliac artery could reflect some poststenotic dilatation measuring up to 1.2 cm with a more proximal vessel caliber 0.8 cm. No suspicious or enlarged lymph nodes in the included lymphatic chains. Reproductive: Anteverted uterus.  No concerning adnexal lesions. Other: No abdominopelvic free fluid or free gas. No bowel containing hernias. Small fat containing umbilical hernia. Small focus of gas in the right anterior abdominal subcutaneous tissues possibly related to injectable use (3/66). Multiple chronic soft tissue calcifications noted in the left flank as well. Musculoskeletal: Slight asymmetric atrophy of the medial right and inferior left rectus sheath, possibly postsurgical in nature. Mild anterior abdominal wall laxity. Slight pelvic floor laxity noted as well. Multilevel degenerative changes are present in the imaged portions of the spine, hips and pelvis. No acute osseous abnormality or suspicious osseous lesion. IMPRESSION: 1. Extensive Aortic Atherosclerosis (ICD10-I70.0). Multilevel plaque narrowing throughout the splanchnic and renal arteries, incompletely assessed on this non angiographic technique, but with likely moderate to severe narrowing of the SMA and IMA origin. Findings could be seen in the setting of a chronic mesenteric ischemic process. 2. No other acute or suggestive features to provide cause for patient's symptoms. 3. Stable right adrenal adenoma. 4. Bilateral renal scarring, left greater than right. Nonobstructing left nephrolithiasis. 5. Aortic Atherosclerosis (ICD10-I70.0). Electronically Signed   By: Lovena Le M.D.   On: 09/24/2019 22:02   CUP PACEART REMOTE DEVICE CHECK  Result Date: 09/18/2019 [facility] summary report received. Battery status OK. Normal device function. No new symptom episodes, tachy episodes, brady, or pause episodes. No new AF episodes. Monthly summary reports and ROV/PRN  ECHOCARDIOGRAM COMPLETE BUBBLE STUDY  Result  Date: 09/23/2019    ECHOCARDIOGRAM REPORT   Patient Name:   Pamela Carney Yung Date of Exam: 09/23/2019 Medical Rec #:  [id number]        Height:       62.0 in Accession #:    [id number]       Weight:       203.9 lb Date of Birth:  1965-08-10        BSA:          1.928 m Patient Age:    39 years         BP:           96/58 mmHg Patient Gender: F                HR:           64 bpm. Exam Location:  Inpatient Procedure: 2D Echo, Cardiac Doppler and Color Doppler Indications:    CVA  History:        Patient has prior history of Echocardiogram examinations, most                 recent 12/15/2018. Stroke; Risk Factors:Dyslipidemia and Obesity.                 ARF.  Sonographer:    Dustin Flock Referring Phys: [id number] [place]  1. Left ventricular ejection fraction, by estimation, is 60 to 65%. The left ventricle has normal function. The left ventricle has no regional wall motion abnormalities. There is moderate left ventricular hypertrophy. Left ventricular diastolic parameters are indeterminate.  2. Right ventricular systolic function is normal. The right ventricular size is normal.  3. The mitral valve is grossly normal. Trivial mitral valve regurgitation.  4. The aortic valve is tricuspid. Aortic valve regurgitation is not visualized.  5. The inferior vena cava is normal in size with greater than 50% respiratory variability, suggesting right atrial pressure of 3 mmHg. FINDINGS  Left Ventricle: Left ventricular ejection fraction, by estimation, is 60 to 65%. The left ventricle has normal function. The left ventricle has no regional wall motion abnormalities. The left ventricular internal cavity size was normal in size. There is  moderate left ventricular hypertrophy. Left ventricular diastolic parameters are indeterminate. Right Ventricle: The right ventricular size is normal. No increase in right ventricular wall thickness. Right ventricular systolic function is normal. Left Atrium: Left atrial size  was normal in size. Right Atrium: Right atrial size was normal in size. Pericardium: Trivial pericardial effusion is present. The pericardial effusion is anterior to the right ventricle. Mitral Valve: The mitral valve is grossly normal. Trivial mitral valve regurgitation. Tricuspid Valve: The tricuspid valve is grossly normal. Tricuspid valve regurgitation is trivial. Aortic Valve: The aortic valve is tricuspid. Aortic valve regurgitation is not visualized. Mild aortic valve annular calcification. Pulmonic Valve: The pulmonic valve was grossly normal. Pulmonic valve regurgitation is not visualized. Aorta: The aortic root is normal in size and structure. Venous: The inferior vena cava is normal in size with greater than 50% respiratory variability, suggesting right atrial pressure of 3 mmHg. IAS/Shunts: No atrial level shunt detected by color flow Doppler. Agitated saline contrast was given intravenously to evaluate for intracardiac shunting.  LEFT VENTRICLE PLAX 2D LVIDd:         4.30 cm  Diastology LVIDs:         2.90 cm  LV e' lateral:   8.05 cm/s LV PW:         1.40 cm  LV E/e' lateral: 9.5 LV IVS:        1.30 cm  LV e' medial:    7.40 cm/s LVOT diam:     2.00 cm  LV E/e' medial:  10.4 LV SV:         73 LV SV Index:   38 LVOT Area:     3.14 cm  RIGHT VENTRICLE RV Basal diam:  2.50 cm RV S prime:     10.60 cm/s TAPSE (M-mode): 2.6 cm LEFT ATRIUM             Index       RIGHT ATRIUM           Index LA diam:        3.60 cm 1.87 cm/m  RA Area:     12.50 cm LA Vol (A2C):   31.8 ml 16.50 ml/m RA Volume:   27.40 ml  14.21 ml/m LA Vol (A4C):   44.2 ml 22.93 ml/m LA Biplane Vol: 40.1 ml 20.80 ml/m  AORTIC VALVE LVOT Vmax:   106.00 cm/s LVOT Vmean:  57.600 cm/s LVOT VTI:    0.233 m  AORTA Ao Root diam: 2.80 cm MITRAL VALVE MV Area (PHT): 2.50 cm    SHUNTS MV Decel Time: 303 msec    Systemic VTI:  0.23 m MV E velocity: 76.60 cm/s  Systemic Diam: 2.00 cm MV A velocity: 97.50 cm/s MV E/A ratio:  0.79 Rozann Lesches  MD Electronically signed by Rozann Lesches MD Signature Date/Time: 09/23/2019/12:36:41 PM    Final    VAS US CAROTID  Result Date: 09/25/2019 Carotid Arterial Duplex Study Indications:       CVA and Dizziness and gait disturbance. Risk Factors:      Hypertension, hyperlipidemia, Diabetes. Comparison Study:  Prior study, indicating 60-79% right mid ICA stenosis and                    left 1-39% ICA stenosis, from 12/21/2018 is available for                    comparison. Performing Technologist: Sharion Dove RVS  Examination Guidelines: A complete evaluation includes B-mode imaging, spectral Doppler, color Doppler, and power Doppler as needed of all accessible portions of each vessel. Bilateral testing is considered an integral part of a complete examination. Limited examinations for reoccurring indications may be performed as noted.  Right Carotid Findings: +----------+--------+--------+--------+------------------+--------+           PSV cm/sEDV cm/sStenosisPlaque DescriptionComments +----------+--------+--------+--------+------------------+--------+ CCA Prox  96      11              heterogenous               +----------+--------+--------+--------+------------------+--------+ CCA Distal76      8               heterogenous               +----------+--------+--------+--------+------------------+--------+ ICA Prox  39      21      1-39%   heterogenous               +----------+--------+--------+--------+------------------+--------+ ICA Distal33      12              heterogenous               +----------+--------+--------+--------+------------------+--------+ ECA       105     9                                          +----------+--------+--------+--------+------------------+--------+ +----------+--------+-------+--------+-------------------+           PSV cm/sEDV cmsDescribeArm Pressure (mmHG) +----------+--------+-------+--------+-------------------+ Subclavian105                                         +----------+--------+-------+--------+-------------------+ +---------+--------+--+--------+--+ VertebralPSV cm/s68EDV cm/s20 +---------+--------+--+--------+--+  Left Carotid Findings: +----------+--------+--------+--------+----------------------+---------+           PSV cm/sEDV cm/sStenosisPlaque Description    Comments  +----------+--------+--------+--------+----------------------+---------+ CCA Prox  90      27              heterogenous                    +----------+--------+--------+--------+----------------------+---------+ CCA Distal73      23              heterogenous                    +----------+--------+--------+--------+----------------------+---------+ ICA Prox  392     181             calcific  and irregular          +----------+--------+--------+--------+----------------------+---------+ ICA Mid   552     244     80-99%                        narrowing +----------+--------+--------+--------+----------------------+---------+ ICA Distal365     45                                              +----------+--------+--------+--------+----------------------+---------+ ECA       108     18              calcific                        +----------+--------+--------+--------+----------------------+---------+ +----------+--------+--------+--------+-------------------+           PSV cm/sEDV cm/sDescribeArm Pressure (mmHG) +----------+--------+--------+--------+-------------------+ [id number]                                         +----------+--------+--------+--------+-------------------+ +---------+--------+--+--------+--+ VertebralPSV cm/s76EDV cm/s22 +---------+--------+--+--------+--+ Significant increase in stenosis since prior study done 11/2018  Summary: Right Carotid: Velocities in the right ICA are consistent with a 1-39% stenosis. Left Carotid: Velocities in the left ICA are consistent  with a 80-99% stenosis. Vertebrals:  Bilateral vertebral arteries demonstrate antegrade flow. Subclavians: Normal flow hemodynamics were seen in bilateral subclavian              arteries. *See table(s) above for measurements and observations.  Electronically signed by Antony Contras MD on 09/25/2019 at 12:45:36 PM.    Final    VAS [place] LOWER EXTREMITY VENOUS (DVT)  Result Date: 09/23/2019  Lower Venous DVTStudy Indications: Stroke.  Comparison Study: No prior study on file Performing Technologist: Sharion Dove RVS  Examination Guidelines: A complete evaluation includes B-mode imaging, spectral Doppler, color Doppler, and power Doppler as needed of all accessible portions of each vessel. Bilateral testing is considered an integral part of a complete examination. Limited examinations for reoccurring indications may be performed as noted. The reflux portion of the exam is performed with the patient in reverse Trendelenburg.  +---------+---------------+---------+-----------+----------+--------------+ RIGHT    CompressibilityPhasicitySpontaneityPropertiesThrombus Aging +---------+---------------+---------+-----------+----------+--------------+ CFV      Full           Yes      Yes                                 +---------+---------------+---------+-----------+----------+--------------+ SFJ      Full                                                        +---------+---------------+---------+-----------+----------+--------------+ FV Prox  Full                                                        +---------+---------------+---------+-----------+----------+--------------+ FV Mid   Full                                                        +---------+---------------+---------+-----------+----------+--------------+  FV DistalFull                                                        +---------+---------------+---------+-----------+----------+--------------+ PFV      Full                                                         +---------+---------------+---------+-----------+----------+--------------+ POP      Full           Yes      Yes                                 +---------+---------------+---------+-----------+----------+--------------+ PTV      Full                                                        +---------+---------------+---------+-----------+----------+--------------+ PERO     Full                                                        +---------+---------------+---------+-----------+----------+--------------+   +---------+---------------+---------+-----------+----------+--------------+ LEFT     CompressibilityPhasicitySpontaneityPropertiesThrombus Aging +---------+---------------+---------+-----------+----------+--------------+ CFV      Full           Yes      Yes                                 +---------+---------------+---------+-----------+----------+--------------+ SFJ      Full                                                        +---------+---------------+---------+-----------+----------+--------------+ FV Prox  Full                                                        +---------+---------------+---------+-----------+----------+--------------+ FV Mid   Full                                                        +---------+---------------+---------+-----------+----------+--------------+ FV DistalFull                                                        +---------+---------------+---------+-----------+----------+--------------+  PFV      Full                                                        +---------+---------------+---------+-----------+----------+--------------+ POP      Full           Yes      Yes                                 +---------+---------------+---------+-----------+----------+--------------+ PTV      Full                                                         +---------+---------------+---------+-----------+----------+--------------+ PERO     Full                                                        +---------+---------------+---------+-----------+----------+--------------+     Summary: BILATERAL: - No evidence of deep vein thrombosis seen in the lower extremities, bilaterally. -   *See table(s) above for measurements and observations. Electronically signed by Monica Martinez MD on 09/23/2019 at 3:15:47 PM.    Final    BMP Latest Ref Rng & Units 09/25/2019 09/24/2019 09/23/2019  Glucose 70 - 99 mg/dL 126(H) 138(H) 169(H)  BUN 6 - 20 mg/dL _0 Creatinine 0.44 - 1.00 mg/dL 1.43(H) 1.17(H) 1.34(H)  BUN/Creat Ratio 9 - 23 - - -  Sodium 135 - 145 mmol/L 138 139 138  Potassium 3.5 - 5.1 mmol/L 3.5 3.0(L) 3.3(L)  Chloride 98 - 111 mmol/L 108 106 106  CO2 22 - 32 mmol/L _1 Calcium 8.9 - 10.3 mg/dL 8.7(L) 9.2 8.7(L)    Discharge Instructions: Discharge Instructions     Call MD for:  extreme fatigue   Complete by: As directed    Call MD for:  persistant dizziness or light-headedness   Complete by: As directed    Diet - low sodium heart healthy   Complete by: As directed    Discharge instructions   Complete by: As directed    It was a pleasure taking care of you! Here are my instructions.  1. STOP taking your indapamide 2. STOP taking your Klor-con (no longer needed since you are stopping imipramine) 3. START taking Brilinta, 12m twice a day, for 30 days 4. Continue your Asprin 846mand Lipitor  5. Instead of consuming 3 normal meals, try eating several small meals throughout the day. This may improve your abdominal pain and diarrhea. 6. Follow up with your PCP 7. Follow up with neurology to discuss the tests which have not yet come back 8. Follow up with your GI doctor to discuss your new diagnosis of chronic mesenteric ischemia, and the tests which have not yet come back 9. Follow up with your urologist if you need to address  your recurrent kidney stones, since we stopped your indapamide   Increase activity slowly   Complete  by: As directed        It was a pleasure taking care of you! Here are my instructions.  1. STOP taking your indapamide 2. STOP taking your Klor-con (no longer needed since you are stopping imipramine) 3. START taking Brilinta, 78m twice a day, for 30 days 4. Continue your Asprin 860mand Lipitor  5. Instead of consuming 3 normal meals, try eating several small meals throughout the day. This may improve your abdominal pain and diarrhea. 6. Follow up with your PCP 7. Follow up with neurology to discuss the tests which have not yet come back 8. Follow up with your GI doctor to discuss your new diagnosis of chronic mesenteric ischemia, and the tests which have not yet come back 9. Follow up with Vascular Surgery to discuss elective carotid endarterectomy and your new diagnosis of chronic mesenteric ischemia 10. Follow up with your urologist if you need to address your recurrent kidney stones, since we stopped your indapamide   Signed: ChAndrew AuMD 09/25/2019, 2:41 PM   Pager: [phone]

## 2019-09-23 NOTE — Consult Note (Addendum)
Referring Physician: Dr. Philipp Ovens    Chief Complaint: Dizziness  HPI: Pamela Carney is a 54 y.o. female with a PMHx of severe intracranial and extracranial atherosclerotic disease, CVA, DM, HTN and HLD who presented to the [facility] ED with a c/c of dizziness. MRI revealed strokes and Neurology was consulted.   Per patient, she went to sleep on Thursday 09/21/19 at 0100 and was completely normal. When she woke up at about 1000 am to use the bathroom she noticed that she was dizzy (described as light headed sensation) and she had trouble walking to the bathroom. Patient does not take a daily ASA. She stopped taking it since her last stroke. Her PCP stopped it per patient.   Patient denies ETOH, drug use. Endorses smoking 1/2 pack of cigarettes per day.  LSN: 09/20/19 tPA Given: no outside of window.   LDL: unable to calculate d/t triglycerides of 455 K+: 2.6  BG: 151  Creat: 1.70  Prior stroke work up from September 2020:  CTA of head and neck: 1. 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 2. Positive also for widespread atherosclerosis and stenosis in the head and neck: - Severe Left Subclavian Artery origin stenosis. - Severe Right ICA bulb stenosis (estimated at 70-80%). - diminutive Left Vertebral Artery which occludes beyond the left PICA origin, and is moderately stenotic at its origin. - Left ICA bulb 60% stenosis. - heavily calcified ICA siphons with moderate stenosis on the Left. - diminutive Basilar Artery with mild stenosis.   Past Medical History:  Diagnosis Date  . Abnormal Pap smear   . AKI (acute kidney injury) ([place]) 12/28/2017  . Anxiety   . Arthritis    bil knees, neck  . Bipolar 1 disorder ([place])   . Cataract    Mild  . Cholelithiasis 12/23/2017   noted on CT renal, pt unaware  . Chronic kidney disease (CKD), stage III (moderate)   . Cystitis   . Depression   . Diabetes mellitus without  complication ([place])    type 2  . Genital warts    Hx of genital  . GERD (gastroesophageal reflux disease)   . Heart murmur    childhood  . Hematuria   . History of blood transfusion   . History of ectopic pregnancy   . History of gestational diabetes   . History of kidney stones   . History of sepsis    after ectopic pregnancy  . Hyperlipidemia   . Hypertension   . Idiopathic peripheral neuropathy    both feet  . Leg ulcer, left ([place])   . Low back pain   . Migraines   . Obesity   . Oral candidiasis   . Pneumonia   . PONV (postoperative nausea and vomiting)    prolonged sedation  . Recurrent UTI   . 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 Sigmoid diverticulosis 12/23/2017   noted on CT renal, pt unaware  . Sleep apnea    uses cpap  . Stroke [facility])    Cryptogenic  . Ulcer of foot ([place])    Left  . Weakness     Past Surgical History:  Procedure Laterality Date  . BUBBLE STUDY  12/23/2018   Procedure: BUBBLE STUDY;  Surgeon: Lelon Perla, MD;  Location: [facility] ENDOSCOPY;  Service: Cardiovascular;;  . CERVICAL CONE BIOPSY    .  CESAREAN SECTION     x3  . CYSTOSCOPY W/ URETERAL STENT PLACEMENT Right 12/15/2018   Procedure: Cystoscopy, right retrograde ureteropyelogram, fluoroscopic interpretation, right double-J stent placement (24 cm x 6 [place]);  Surgeon: Franchot Gallo, MD;  Location: [place];  Service: Urology;  Laterality: Right;  . CYSTOSCOPY WITH RETROGRADE PYELOGRAM, URETEROSCOPY AND STENT PLACEMENT Right 01/27/2019   Procedure: CYSTOSCOPY WITH RETROGRADE PYELOGRAM, URETEROSCOPY AND STENT PLACEMENT; BASKET STONE RETRIEVAL;  Surgeon: Alexis Frock, MD;  Location: [facility] ORS;  Service: Urology;  Laterality: Right;  75 MINS  . CYSTOSCOPY/URETEROSCOPY/HOLMIUM LASER/STENT PLACEMENT Right 02/04/2018   Procedure: CYSTOSCOPY/URETEROSCOPY/RETROGRADE PYELOGRAM/HOLMIUM LASER/STENT PLACEMENT;   Surgeon: Alexis Frock, MD;  Location: [facility];  Service: Urology;  Laterality: Right;  . CYSTOSCOPY/URETEROSCOPY/HOLMIUM LASER/STENT PLACEMENT Right 02/07/2018   Procedure: CYSTOSCOPY LEFT STENT EXCHANGE;  Surgeon: Alexis Frock, MD;  Location: [facility];  Service: Urology;  Laterality: Right;  . DILATION AND CURETTAGE OF UTERUS    . ECTOPIC PREGNANCY SURGERY    . IR NEPHROSTOMY PLACEMENT LEFT  12/25/2017  . LOOP RECORDER INSERTION N/A 12/23/2018   Procedure: LOOP RECORDER INSERTION;  Surgeon: Constance Haw, MD;  Location: [facility];  Service: Cardiovascular;  Laterality: N/A;  . NEPHROLITHOTOMY Left 02/04/2018   Procedure: NEPHROLITHOTOMY PERCUTANEOUS;  Surgeon: Alexis Frock, MD;  Location: [facility];  Service: Urology;  Laterality: Left;  3 HRS  . NEPHROLITHOTOMY Left 02/07/2018   Procedure: SECOND LOOK NEPHROLITHOTOMY PERCUTANEOUS;  Surgeon: Alexis Frock, MD;  Location: [facility];  Service: Urology;  Laterality: Left;  2 HRS  . OVARIAN CYST REMOVAL    . TEE WITHOUT CARDIOVERSION N/A 12/23/2018   Procedure: TRANSESOPHAGEAL ECHOCARDIOGRAM (TEE);  Surgeon: Lelon Perla, MD;  Location: [facility] ENDOSCOPY;  Service: Cardiovascular;  Laterality: N/A;  . UNILATERAL SALPINGECTOMY     Pt unsure of which fallopian tube was removed  . WISDOM TOOTH EXTRACTION      Family History  Problem Relation Age of Onset  . Diabetes Paternal Grandfather   . COPD Paternal Grandfather   . Heart disease Paternal Grandfather   . Cancer Paternal Grandfather        bone  . Cancer Paternal Grandmother   . Heart disease Paternal Grandmother   . Cancer Father   . Hypertension Father   . Heart attack Father   . Alcohol abuse Father   . Depression Father   . Heart failure Mother   . Diabetes Mother   . Heart disease Mother   . Hyperlipidemia Mother   . Hypertension Mother   . Heart attack Mother   . Peripheral vascular disease Mother   . Depression Mother   . COPD Mother   . Hypertension  Sister   . Heart attack Sister   . Stroke Maternal Grandmother   . Colon cancer Neg Hx   . Colon polyps Neg Hx   . Esophageal cancer Neg Hx   . Rectal cancer Neg Hx   . Stomach cancer Neg Hx    Social History:  reports that she has been smoking cigarettes. She has a 37.00 pack-year smoking history. She has never used smokeless tobacco. She reports that she does not drink alcohol and does not use drugs.  Allergies:  Allergies  Allergen Reactions  . Sulfa Antibiotics Hives and Swelling    Swelling site not recalled  . Latex Rash  . Tape Rash    Not tolerated well    Medications:  Current Facility-Administered Medications  Medication Dose Route Frequency Provider Last Rate Last  Admin  . atorvastatin (LIPITOR) tablet 80 mg  80 mg Oral q1800 Agyei, Obed K, MD      . DULoxetine (CYMBALTA) DR capsule 60 mg  60 mg Oral Daily Agyei, Obed K, MD   60 mg at 09/23/19 1300  . insulin aspart (novoLOG) injection 0-15 Units  0-15 Units Subcutaneous TID WC Jean Rosenthal, MD   3 Units at 09/23/19 1302  . insulin glargine (LANTUS) injection 7 Units  7 Units Subcutaneous QHS Agyei, Obed K, MD      . lactated ringers infusion   Intravenous Continuous Jean Rosenthal, MD 100 mL/hr at 09/23/19 1300 New Bag at 09/23/19 1300  . midodrine (PROAMATINE) tablet 5 mg  5 mg Oral TID WC Agyei, Obed K, MD   5 mg at 09/23/19 1300   Current Outpatient Medications  Medication Sig Dispense Refill  . atorvastatin (LIPITOR) 80 MG tablet Take 1 tablet (80 mg total) by mouth daily at 6 PM. 90 tablet 2  . AZO CRANBERRY GUMMIES PO Take 2 tablets by mouth daily.    . Blood Glucose Monitoring Suppl (ONETOUCH VERIO) w/Device KIT Use to check blood sugars every morning fasting and 2 hours after largest meal (Patient taking differently: 1 each by Other route See admin instructions. Use to check blood sugars every morning fasting and 2 hours after largest meal) 1 kit 0  . DULoxetine (CYMBALTA) 60 MG capsule TAKE 1 CAPSULE BY MOUTH  DAILY (Patient taking differently: Take 60 mg by mouth daily. ) 90 capsule 1  . fluconazole (DIFLUCAN) 150 MG tablet Take 150 mg by mouth once.    Marland Kitchen glucose blood (ONETOUCH VERIO) test strip Use to check blood sugars every morning fasting and 2 hours after largest meal (Patient taking differently: 1 each by Other route See admin instructions. Use to check blood sugars every morning fasting and 2 hours after largest meal) 200 each 3  . ibuprofen (ADVIL) 200 MG tablet Take 200 mg by mouth every 6 (six) hours as needed for moderate pain.    . indapamide (LOZOL) 2.5 MG tablet Take 2.5 mg by mouth every evening.    . Insulin Pen Needle (PEN NEEDLES) 31G X 8 MM MISC Use as instructed. Inject into the skin once nightly. (Patient taking differently: 1 each by Other route See admin instructions. Use as instructed. Inject into the skin once nightly.) 100 each 3  . Multiple Vitamins-Minerals (ONE-A-DAY WOMENS 50+ ADVANTAGE) TABS Take 1 tablet by mouth daily.    . ondansetron (ZOFRAN) 4 MG tablet Take 1 tablet (4 mg total) by mouth every 8 (eight) hours as needed for nausea or vomiting. 20 tablet 1  . ONE TOUCH LANCETS MISC Use to check blood sugars every morning fasting and 2 hours after largest meal (Patient taking differently: 1 each by Other route See admin instructions. Use to check blood sugars every morning fasting and 2 hours after largest meal) 200 each 3  . potassium chloride SA (KLOR-CON) 20 MEQ tablet Take 1 tablet (20 mEq total) by mouth daily. 7 tablet 0  . topiramate (TOPAMAX) 100 MG tablet Take 1 tablet (100 mg total) by mouth 2 (two) times daily. (Patient taking differently: Take 50 mg by mouth 2 (two) times daily. ) 180 tablet 3  . cetirizine (ZYRTEC) 10 MG tablet Take 1 tablet (10 mg total) by mouth daily. 90 tablet 2  . fluticasone (FLONASE) 50 MCG/ACT nasal spray Place 1 spray into both nostrils daily. (Patient not taking: Reported on 08/30/2019)  16 g 2  . insulin glargine (LANTUS) 100 UNIT/ML  Solostar Pen Inject 15 Units into the skin at bedtime. (Patient taking differently: Inject 21 Units into the skin at bedtime. ) 15 mL prn     ROS: ROS was performed and is negative except as noted in HPI   Physical Examination: Blood pressure 104/89, pulse 76, temperature 98.6 F (37 C), temperature source Oral, resp. rate 19, SpO2 97 %.  HEENT: [place]/AT Lungs: Respirations unlabored Ext: Warm and well-perfused  Neurologic Examination: Ment: awake, alert, oriented x 4. Able to follow all commands. Slight dysathria. CN: PERRL. Right hemianopsia. EOMI. Face symmetric. Facial LT sensation intact, hearing intact, tongue protrudes midine. Motor:  RUE: 4+/5 LUE 5/5 RLE: 5/5 LLE: 5/5 Sensation: intact to LT Cerebellar: normal FNF and HTS   Results for orders placed or performed during the hospital encounter of 09/23/19 (from the past 48 hour(s))  Protime-INR     Status: None   Collection Time: 09/23/19 12:55 AM  Result Value Ref Range   Prothrombin Time 11.8 11.4 - 15.2 seconds   INR 0.9 0.8 - 1.2    Comment: (NOTE) INR goal varies based on device and disease states. Performed at [facility], [place] [address] [place], [place] [address]   APTT     Status: None   Collection Time: 09/23/19 12:55 AM  Result Value Ref Range   aPTT 30 24 - 36 seconds    Comment: Performed at [facility] [address] [place], [place] [address]  CBC     Status: None   Collection Time: 09/23/19 12:55 AM  Result Value Ref Range   WBC 9.6 4.0 - 10.5 K/uL   RBC 4.63 3.87 - 5.11 MIL/uL   Hemoglobin 14.3 12.0 - 15.0 g/dL   HCT 45.6 36 - 46 %   MCV 98.5 80.0 - 100.0 fL   MCH 30.9 26.0 - 34.0 pg   MCHC 31.4 30.0 - 36.0 g/dL   RDW 13.2 11.5 - 15.5 %   Platelets 354 150 - 400 K/uL   nRBC 0.0 0.0 - 0.2 %    Comment: Performed at [facility], [place] [address] [place], [place] [address]  Differential     Status: None   Collection Time: 09/23/19 12:55 AM  Result Value Ref Range    Neutrophils Relative % 66 %   Neutro Abs 6.4 1.7 - 7.7 K/uL   Lymphocytes Relative 24 %   Lymphs Abs 2.3 0.7 - 4.0 K/uL   Monocytes Relative 7 %   Monocytes Absolute 0.7 0 - 1 K/uL   Eosinophils Relative 2 %   Eosinophils Absolute 0.1 0 - 0 K/uL   Basophils Relative 1 %   Basophils Absolute 0.1 0 - 0 K/uL   Immature Granulocytes 0 %   Abs Immature Granulocytes 0.03 0.00 - 0.07 K/uL    Comment: Performed at [facility], [place] [address], [place], [place] [address]  Comprehensive metabolic panel     Status: Abnormal   Collection Time: 09/23/19 12:55 AM  Result Value Ref Range   Sodium 139 135 - 145 mmol/L   Potassium 2.7 (LL) 3.5 - 5.1 mmol/L    Comment: CRITICAL RESULT CALLED TO, READ BACK BY AND VERIFIED WITH: RUGGIERO M,RN 09/23/19 0223 WAYK    Chloride 100 98 - 111 mmol/L   CO2 24 22 - 32 mmol/L   Glucose, Bld 152 (H) 70 - 99 mg/dL    Comment:  Glucose reference range applies only to samples taken after fasting for at least 8 hours.   BUN 21 (H) 6 - 20 mg/dL   Creatinine, Ser 1.69 (H) 0.44 - 1.00 mg/dL   Calcium 9.3 8.9 - 10.3 mg/dL   Total Protein 6.8 6.5 - 8.1 g/dL   Albumin 3.4 (L) 3.5 - 5.0 g/dL   AST 23 15 - 41 U/L   ALT 23 0 - 44 U/L   Alkaline Phosphatase 116 38 - 126 U/L   Total Bilirubin 0.4 0.3 - 1.2 mg/dL   GFR calc non Af Amer 34 (L) >60 mL/min   GFR calc Af Amer 40 (L) >60 mL/min   Anion gap 15 5 - 15    Comment: Performed at [facility] [address], [place], [place] [address]  I-Stat beta hCG blood, ED     Status: None   Collection Time: 09/23/19  1:13 AM  Result Value Ref Range   I-stat hCG, quantitative <5.0 <5 mIU/mL   Comment 3            Comment:   GEST. AGE      CONC.  (mIU/mL)   <=1 WEEK        5 - 50     2 WEEKS       50 - 500     3 WEEKS       100 - 10,000     4 WEEKS     1,000 - 30,000        FEMALE AND NON-PREGNANT FEMALE:     LESS THAN 5 mIU/mL   I-stat chem 8, ED     Status: Abnormal   Collection Time: 09/23/19  1:17 AM   Result Value Ref Range   Sodium 140 135 - 145 mmol/L   Potassium 2.6 (LL) 3.5 - 5.1 mmol/L   Chloride 100 98 - 111 mmol/L   BUN 23 (H) 6 - 20 mg/dL   Creatinine, Ser 1.70 (H) 0.44 - 1.00 mg/dL   Glucose, Bld 151 (H) 70 - 99 mg/dL    Comment: Glucose reference range applies only to samples taken after fasting for at least 8 hours.   Calcium, Ion 1.13 (L) 1.15 - 1.40 mmol/L   TCO2 29 22 - 32 mmol/L   Hemoglobin 14.6 12.0 - 15.0 g/dL   HCT 43.0 36 - 46 %   CT HEAD WO CONTRAST  Result Date: 09/23/2019 CLINICAL DATA:  Concern for subacute stroke, dizziness since Thursday morning with gait disturbance EXAM: CT HEAD WITHOUT CONTRAST TECHNIQUE: Contiguous axial images were obtained from the base of the skull through the vertex without intravenous contrast. COMPARISON:  CT and MRI 04/27/2019 FINDINGS: Brain: Stable appearance of chronic encephalomalacia in the left occipital lobe. Additional focus hypoattenuation in the left corona radiata likely reflecting sequela of prior infarct as well. No convincing CT evidence of acute infarction, hemorrhage, hydrocephalus, extra-axial collection or mass lesion/mass effect. Symmetric prominence of the ventricles, cisterns and sulci compatible with parenchymal volume loss. Patchy areas of white matter hypoattenuation are most compatible with chronic microvascular angiopathy. Vascular: Atherosclerotic calcification of the carotid siphons and intradural vertebral arteries. No hyperdense vessel. Skull: No calvarial fracture or suspicious osseous lesion. No scalp swelling or hematoma. Sinuses/Orbits: Paranasal sinuses and mastoid air cells are predominantly clear. Included orbital structures are unremarkable. Other: None IMPRESSION: 1. No CT evidence of acute intracranial abnormality. If there is persisting concern for acute/subacute infarction, MRI is more sensitive and specific for  early features of ischemia. 2. Stable appearance of encephalomalacia in the left occipital  lobe and left corona radiata. 3. Stable parenchymal volume loss and chronic microvascular angiopathy. Electronically Signed   By: Lovena Le M.D.   On: 09/23/2019 01:38   MR BRAIN WO CONTRAST  Result Date: 09/23/2019 CLINICAL DATA:  Initial evaluation for acute ataxia, stroke suspected. EXAM: MRI HEAD WITHOUT CONTRAST TECHNIQUE: Multiplanar, multiecho pulse sequences of the brain and surrounding structures were obtained without intravenous contrast. COMPARISON:  Prior head CT from earlier the same day as well as previous brain MRI from 04/27/2019. FINDINGS: Brain: Examination moderately degraded by motion artifact. Generalized age-related cerebral atrophy. Patchy and confluent T2/FLAIR hyperintensity throughout the periventricular deep white matter both cerebral hemispheres as well as the pons, consistent with chronic small vessel ischemic disease, moderate nature. Remote lacunar infarct present at the left basal ganglia/left corona radiata. Additional small remote left parietal infarct, left PCA territory. Multifocal patchy foci of restricted diffusion seen involving the cortical and subcortical white matter of both cerebral hemispheres, most pronounced posteriorly (series 5, images 89, 88, 84 82 79, 74, 71). For reference purposes, largest area infarction seen at the posterior left frontoparietal centrum semi ovale and measures 11 mm. These foci are somewhat watershed in distribution, and could potentially reflect a transient hypoperfusion injury. No associated hemorrhage or mass effect. No mass lesion, midline shift or mass effect. Ventricles normal size without hydrocephalus. No extra-axial fluid collection. Pituitary gland within normal limits. Midline structures intact. Vascular: Major intracranial vascular flow voids are maintained. Skull and upper cervical spine: Craniocervical junction within normal limits. Upper cervical spine normal. Bone marrow signal intensity within normal limits. No scalp soft  tissue abnormality. Sinuses/Orbits: Globes and orbital soft tissues within normal limits. Paranasal sinuses are clear. No mastoid effusion. Other: None. IMPRESSION: 1. Multifocal patchy small volume acute ischemic nonhemorrhagic infarcts involving the cortical and subcortical cerebral hemispheres bilaterally, most pronounced posteriorly. Several of these foci are somewhat watershed in distribution, and could reflect a transient hypoperfusion injury. Correlation with history recommended. 2. 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 Electronically Signed   By: Jeannine Boga M.D.   On: 09/23/2019 05:47    Assessment: 54 y.o. female presenting with acute bilateral strokes in the ACA/MCA watershed distribution, in addition to punctate cortically based acute strokes, which appear most likely to be cardioembolic 1. Exam reveals mild RUE weakness and slight dysarthria.  2. MRI brain reveals multifocal patchy small volume acute ischemic nonhemorrhagic infarcts involving the cortical and subcortical cerebral hemispheres bilaterally, most pronounced posteriorly. Several of these foci are somewhat watershed in distribution, and could reflect a transient hypoperfusion injury. Also noted is underlying age-related cerebral atrophy with moderate chronic small vessel ischemic disease, clearly abnormal for her age, with remote left PCA and left basal ganglia infarcts. Likely cardioembolic. 3. Stroke Risk Factors - diabetes mellitus, hyperlipidemia and hypertension  4. Carotid ultrasound from today: Right Carotid: Velocities in the right ICA are consistent with a 1-39% stenosis. Left Carotid: Velocities in the left ICA are consistent with a 80-99% stenosis. Vertebrals: Bilateral vertebral arteries demonstrate antegrade flow. Subclavians: Normal flow hemodynamics were seen in bilateral subclavian  arteries  Recommendations: 1. HgbA1c, fasting lipid panel 2. CTA of head and neck to assess for possible medium-vessel vasculitis or worsening of her severe atherosclerotic disease. 3. PT consult, OT consult, Speech consult 4. Echocardiogram 5. Continue atorvastatin 6. Start Aspirin - dose  81 mg daily 7. Risk factor modification 8. Telemetry monitoring 9. Frequent neuro checks 10. Hypercoagulable panel 11. Basic vasculitis work up: ESR, C-reactive protein, lupus panel 12. Smoking cessation 13. BP control. Out of permissive HTN time window   _0  signed: Dr. Kerney Elbe  09/23/2019, 8:47 AM

## 2019-09-23 NOTE — ED Notes (Signed)
Pt's current BP 84/59 (69), pt mentating appropriately, A&Ox4

## 2019-09-23 NOTE — Progress Notes (Signed)
  Echocardiogram 2D Echocardiogram has been performed.  Pamela Carney 09/23/2019, 10:57 AM

## 2019-09-23 NOTE — ED Provider Notes (Signed)
Received patient at signout from [facility].  Refer to provider note for full history and physical examination.  Briefly patient is a 54 year old female presenting for evaluation of constant dizziness beginning around 3 days ago.  She feels off balance.  Found to be hypokalemic, receiving oral and IV replenishment.  EKG shows QT interval within normal limits.  Head CT shows no acute intracranial abnormality.  Pending MRI to rule out stroke.  Also of note she states that her blood pressures tend to run low due to the medications she is on.    Physical Exam  BP 104/89 (BP Location: Right Arm)   Pulse 76   Temp 98.6 F (37 C) (Oral)   Resp 19   SpO2 97%   Physical Exam Vitals and nursing note reviewed.  Constitutional:      General: She is not in acute distress.    Appearance: She is well-developed.  HENT:     Head: Normocephalic and atraumatic.  Eyes:     General:        Right eye: No discharge.        Left eye: No discharge.     Conjunctiva/sclera: Conjunctivae normal.  Neck:     Vascular: No JVD.     Trachea: No tracheal deviation.  Cardiovascular:     Rate and Rhythm: Normal rate.  Pulmonary:     Effort: Pulmonary effort is normal.  Abdominal:     General: There is no distension.  Skin:    General: Skin is warm.     Findings: No erythema.  Neurological:     Mental Status: She is alert.     Comments: Speech is fluent and goal oriented.  No facial droop noted.  Moves all extremities spontaneous without difficulty.  Endorses feeling dizzy while laying in bed.  Psychiatric:        Behavior: Behavior normal.     ED Course/Procedures     Procedures  MDM  CT HEAD WO CONTRAST  Result Date: 09/23/2019 CLINICAL DATA:  Concern for subacute stroke, dizziness since Thursday morning with gait disturbance EXAM: CT HEAD WITHOUT CONTRAST TECHNIQUE: Contiguous axial images were obtained from the base of the skull through the vertex without intravenous contrast. COMPARISON:  CT and  MRI 04/27/2019 FINDINGS: Brain: Stable appearance of chronic encephalomalacia in the left occipital lobe. Additional focus hypoattenuation in the left corona radiata likely reflecting sequela of prior infarct as well. No convincing CT evidence of acute infarction, hemorrhage, hydrocephalus, extra-axial collection or mass lesion/mass effect. Symmetric prominence of the ventricles, cisterns and sulci compatible with parenchymal volume loss. Patchy areas of white matter hypoattenuation are most compatible with chronic microvascular angiopathy. Vascular: Atherosclerotic calcification of the carotid siphons and intradural vertebral arteries. No hyperdense vessel. Skull: No calvarial fracture or suspicious osseous lesion. No scalp swelling or hematoma. Sinuses/Orbits: Paranasal sinuses and mastoid air cells are predominantly clear. Included orbital structures are unremarkable. Other: None IMPRESSION: 1. No CT evidence of acute intracranial abnormality. If there is persisting concern for acute/subacute infarction, MRI is more sensitive and specific for early features of ischemia. 2. Stable appearance of encephalomalacia in the left occipital lobe and left corona radiata. 3. Stable parenchymal volume loss and chronic microvascular angiopathy. Electronically Signed   By: Lovena Le M.D.   On: 09/23/2019 01:38   MR BRAIN WO CONTRAST  Result Date: 09/23/2019 CLINICAL DATA:  Initial evaluation for acute ataxia, stroke suspected. EXAM: MRI HEAD WITHOUT CONTRAST TECHNIQUE: Multiplanar, multiecho pulse sequences of the brain  and surrounding structures were obtained without intravenous contrast. COMPARISON:  Prior head CT from earlier the same day as well as previous brain MRI from 04/27/2019. FINDINGS: Brain: Examination moderately degraded by motion artifact. Generalized age-related cerebral atrophy. Patchy and confluent T2/FLAIR hyperintensity throughout the periventricular deep white matter both cerebral hemispheres as  well as the pons, consistent with chronic small vessel ischemic disease, moderate nature. Remote lacunar infarct present at the left basal ganglia/left corona radiata. Additional small remote left parietal infarct, left PCA territory. Multifocal patchy foci of restricted diffusion seen involving the cortical and subcortical white matter of both cerebral hemispheres, most pronounced posteriorly (series 5, images 89, 88, 84 82 79, 74, 71). For reference purposes, largest area infarction seen at the posterior left frontoparietal centrum semi ovale and measures 11 mm. These foci are somewhat watershed in distribution, and could potentially reflect a transient hypoperfusion injury. No associated hemorrhage or mass effect. No mass lesion, midline shift or mass effect. Ventricles normal size without hydrocephalus. No extra-axial fluid collection. Pituitary gland within normal limits. Midline structures intact. Vascular: Major intracranial vascular flow voids are maintained. Skull and upper cervical spine: Craniocervical junction within normal limits. Upper cervical spine normal. Bone marrow signal intensity within normal limits. No scalp soft tissue abnormality. Sinuses/Orbits: Globes and orbital soft tissues within normal limits. Paranasal sinuses are clear. No mastoid effusion. Other: None. IMPRESSION: 1. Multifocal patchy small volume acute ischemic nonhemorrhagic infarcts involving the cortical and subcortical cerebral hemispheres bilaterally, most pronounced posteriorly. Several of these foci are somewhat watershed in distribution, and could reflect a transient hypoperfusion injury. Correlation with history recommended. 2. 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 Electronically Signed   By: Jeannine Boga M.D.   On: 09/23/2019 05:47    MRI shows multifocal patchy small volume acute ischemic nonhemorrhagic  infarcts.  Some of these lesions could reflect transient hypoperfusion injury.  There is also evidence of remote left PCA and left basal ganglia infarct.    Internal medicine teaching service to admit.  CONSULT: Spoke with Dr. Cheral Marker with on-call neurology service who will assess the patient while admitted.      Renita Papa, PA-C 09/23/19 1038    Ward, Delice Bison, DO 09/23/19 2317

## 2019-09-23 NOTE — Plan of Care (Signed)
Discussed with patient plan of care for the evening, pain management and assistance using the bedside commode to call for help with some teach back displayed

## 2019-09-23 NOTE — ED Notes (Signed)
Per pt, she takes medication to prevent kidney stones & says that it makes her blood pressure "super low, like you can't read it on the meter & it makes the machine go crazy." Pt's most recent pressure 103/70, will continue to monitor & assess pt's mental status

## 2019-09-23 NOTE — H&P (Signed)
- Date: 09/23/2019               Patient Name:  Pamela Carney MRN: [id number]  DOB: [birth year] Age / Sex: 54 y.o., female   PCP: [name], NP              Medical Service: Internal Medicine Teaching Service              Attending Physician: Dr. Velna Ochs, MD    First Contact: Nanda Quinton, Medical Student Pager: [phone]  Second Contact: Dr. Bridgett Larsson Pager: [phone]  Third Contact Dr. Eileen Stanford Pager: [phone]       After Hours (After 5p/  First Contact Pager: [phone]  weekends / holidays): Second Contact Pager: [phone]   Chief Complaint: Dizziness  History of Present Illness:  Pamela Carney is a 54 year old female with a PMH of CVA, DM, HLD, and HTN  who presents today with dizziness, extremity weakness, and diarrhea. She reports that her symptoms started after waking 3 days ago in the morning when she attempted to get up to use the restroom after waking. Upon standing, she felt dizziness, shortness of breath, palpitations, and what she describes as heaviness in her arms and legs. She says that these symptoms seemed to improve after lying back down. She also reports a mild headache in her occiput but that this was not new to her and similar in character to her chronic headaches. She denies experiencing any difficulty speaking, facial drooping, or changes in vision. Since that time, outside of standing from a lying or seated position she does not experience these symptoms.  Since April 2021, Pamela Carney reports having diarrhea that started as loose stools and progressed to 4x daily liquidy stools that she describes as "blow out". Her PCP discontinued her metformin last month in an attempt to improve this. Her diahrrea stopped for 2 days but then promptly returned to the previous 4x daily frequency and has continued this way since then. She notes some abdominal cramping and pain before a bowel movement but has seen no blood or tarry stools.  She notes chronically having  hypotension, and is concerned that her indapamide for kidney stone prophylaxis may be involved.  She had a cryptogenic stroke in 11/2018 with MRI demonstrating L medial occipital lobe infarct. She presented for a UTI at the time and was evaluated for CVA after mentioning the changes in her vision.When asked, she reported no similarities between what she experienced today in comparison to 1 year ago. Workup included 2D echo without cardiac source of embolism, negative for PFO, loop recorder without A fib detected as of last neurology office visit in April, and CTA demonstrating widespread atherosclerosis involving the head and neck.  Currently Pamela Carney notes some pain in her posterior superior neck that is worsened with rotation of her head. She also reports a headache but states that this is mild and similar in character to her chronic headaches. She denies chest pain, shortness of breath, palpitations, cough, vomiting, melena, hematochezia, fever, and chills.  Meds: Current Facility-Administered Medications  Medication Dose Route Frequency Provider Last Rate Last Admin  . atorvastatin (LIPITOR) tablet 80 mg  80 mg Oral q1800 Agyei, Obed K, MD      . DULoxetine (CYMBALTA) DR capsule 60 mg  60 mg Oral Daily Agyei, Obed K, MD      . insulin aspart (novoLOG) injection 0-15 Units  0-15 Units Subcutaneous TID WC Agyei, Caprice Kluver, MD      . [  START ON 09/24/2019] insulin glargine (LANTUS) injection 7 Units  7 Units Subcutaneous QHS Agyei, Obed K, MD      . lactated ringers bolus 1,000 mL  1,000 mL Intravenous Once Agyei, Obed K, MD       Followed by  . lactated ringers infusion   Intravenous Continuous Agyei, Obed K, MD      . LORazepam (ATIVAN) injection 0.5 mg  0.5 mg Intravenous Once Agyei, Obed K, MD      . midodrine (PROAMATINE) tablet 5 mg  5 mg Oral TID WC Agyei, Obed K, MD      . potassium chloride 10 mEq in 100 mL IVPB  10 mEq Intravenous Q1 Hr x 5 Agyei, Obed K, MD       Current Outpatient  Medications  Medication Sig Dispense Refill  . Blood Glucose Monitoring Suppl (ONETOUCH VERIO) w/Device KIT Use to check blood sugars every morning fasting and 2 hours after largest meal (Patient taking differently: 1 each by Other route See admin instructions. Use to check blood sugars every morning fasting and 2 hours after largest meal) 1 kit 0  . Insulin Pen Needle (PEN NEEDLES) 31G X 8 MM MISC Use as instructed. Inject into the skin once nightly. (Patient taking differently: 1 each by Other route See admin instructions. Use as instructed. Inject into the skin once nightly.) 100 each 3  . atorvastatin (LIPITOR) 80 MG tablet Take 1 tablet (80 mg total) by mouth daily at 6 PM. 90 tablet 2  . AZO CRANBERRY GUMMIES PO Take 2 tablets by mouth daily.    . cetirizine (ZYRTEC) 10 MG tablet Take 1 tablet (10 mg total) by mouth daily. 90 tablet 2  . DULoxetine (CYMBALTA) 60 MG capsule TAKE 1 CAPSULE BY MOUTH DAILY (Patient taking differently: Take 60 mg by mouth daily. ) 90 capsule 1  . fluconazole (DIFLUCAN) 150 MG tablet Take 150 mg by mouth once.    . fluticasone (FLONASE) 50 MCG/ACT nasal spray Place 1 spray into both nostrils daily. (Patient not taking: Reported on 08/30/2019) 16 g 2  . glucose blood (ONETOUCH VERIO) test strip Use to check blood sugars every morning fasting and 2 hours after largest meal (Patient taking differently: 1 each by Other route See admin instructions. Use to check blood sugars every morning fasting and 2 hours after largest meal) 200 each 3  . ibuprofen (ADVIL) 200 MG tablet Take 200 mg by mouth every 6 (six) hours as needed for moderate pain.    . indapamide (LOZOL) 2.5 MG tablet Take 2.5 mg by mouth every evening.    . insulin glargine (LANTUS) 100 UNIT/ML Solostar Pen Inject 15 Units into the skin at bedtime. 15 mL prn  . metFORMIN (GLUCOPHAGE) 500 MG tablet Take 2 tablets (1,000 mg total) by mouth 2 (two) times daily with a meal. (Patient not taking: Reported on 08/30/2019)  360 tablet 0  . Multiple Vitamins-Minerals (ONE-A-DAY WOMENS 50+ ADVANTAGE) TABS Take 1 tablet by mouth daily.    . ondansetron (ZOFRAN) 4 MG tablet Take 1 tablet (4 mg total) by mouth every 8 (eight) hours as needed for nausea or vomiting. (Patient not taking: Reported on 08/30/2019) 20 tablet 1  . ONE TOUCH LANCETS MISC Use to check blood sugars every morning fasting and 2 hours after largest meal (Patient taking differently: 1 each by Other route See admin instructions. Use to check blood sugars every morning fasting and 2 hours after largest meal) 200 each 3  . potassium  chloride SA (KLOR-CON) 20 MEQ tablet Take 1 tablet (20 mEq total) by mouth daily. 7 tablet 0  . topiramate (TOPAMAX) 100 MG tablet Take 1 tablet (100 mg total) by mouth 2 (two) times daily. (Patient taking differently: Take 50 mg by mouth 2 (two) times daily. ) 180 tablet 3    Allergies: Allergies as of 09/23/2019 - Review Complete 09/23/2019  Allergen Reaction Noted  . Sulfa antibiotics Hives and Swelling 12/27/2012  . Latex Rash 12/23/2017  . Tape Rash 12/23/2017   Past Medical History:  Diagnosis Date  . Abnormal Pap smear   . AKI (acute kidney injury) ([place]) 12/28/2017  . Anxiety   . Arthritis    bil knees, neck  . Bipolar 1 disorder ([place])   . Cataract    Mild  . Cholelithiasis 12/23/2017   noted on CT renal, pt unaware  . Chronic kidney disease (CKD), stage III (moderate)   . Cystitis   . Depression   . Diabetes mellitus without complication ([place])    type 2  . Genital warts    Hx of genital  . GERD (gastroesophageal reflux disease)   . Heart murmur    childhood  . Hematuria   . History of blood transfusion   . History of ectopic pregnancy   . History of gestational diabetes   . History of kidney stones   . History of sepsis    after ectopic pregnancy  . Hyperlipidemia   . Hypertension   . Idiopathic peripheral neuropathy    both feet  . Leg ulcer, left ([place])   . Low back pain   . Migraines   .  Obesity   . Oral candidiasis   . Pneumonia   . PONV (postoperative nausea and vomiting)    prolonged sedation  . Recurrent UTI   . 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 Sigmoid diverticulosis 12/23/2017   noted on CT renal, pt unaware  . Sleep apnea    uses cpap  . Stroke [facility])    Cryptogenic  . Ulcer of foot ([place])    Left  . Weakness    Past Surgical History:  Procedure Laterality Date  . BUBBLE STUDY  12/23/2018   Procedure: BUBBLE STUDY;  Surgeon: Lelon Perla, MD;  Location: [facility] ENDOSCOPY;  Service: Cardiovascular;;  . CERVICAL CONE BIOPSY    . CESAREAN SECTION     x3  . CYSTOSCOPY W/ URETERAL STENT PLACEMENT Right 12/15/2018   Procedure: Cystoscopy, right retrograde ureteropyelogram, fluoroscopic interpretation, right double-J stent placement (24 cm x 6 [place]);  Surgeon: Franchot Gallo, MD;  Location: [facility];  Service: Urology;  Laterality: Right;  . CYSTOSCOPY WITH RETROGRADE PYELOGRAM, URETEROSCOPY AND STENT PLACEMENT Right 01/27/2019   Procedure: CYSTOSCOPY WITH RETROGRADE PYELOGRAM, URETEROSCOPY AND STENT PLACEMENT; BASKET STONE RETRIEVAL;  Surgeon: Alexis Frock, MD;  Location: [facility] ORS;  Service: Urology;  Laterality: Right;  75 MINS  . CYSTOSCOPY/URETEROSCOPY/HOLMIUM LASER/STENT PLACEMENT Right 02/04/2018   Procedure: CYSTOSCOPY/URETEROSCOPY/RETROGRADE PYELOGRAM/HOLMIUM LASER/STENT PLACEMENT;  Surgeon: Alexis Frock, MD;  Location: [facility] ORS;  Service: Urology;  Laterality: Right;  . CYSTOSCOPY/URETEROSCOPY/HOLMIUM LASER/STENT PLACEMENT Right 02/07/2018   Procedure: CYSTOSCOPY LEFT STENT EXCHANGE;  Surgeon: Alexis Frock, MD;  Location: [facility] ORS;  Service: Urology;  Laterality: Right;  . DILATION AND CURETTAGE OF UTERUS    . ECTOPIC PREGNANCY SURGERY    . IR NEPHROSTOMY PLACEMENT LEFT  12/25/2017  . LOOP RECORDER INSERTION  N/A 12/23/2018   Procedure: LOOP RECORDER INSERTION;   Surgeon: Constance Haw, MD;  Location: [place] CV LAB;  Service: Cardiovascular;  Laterality: N/A;  . NEPHROLITHOTOMY Left 02/04/2018   Procedure: NEPHROLITHOTOMY PERCUTANEOUS;  Surgeon: Alexis Frock, MD;  Location: [facility] ORS;  Service: Urology;  Laterality: Left;  3 HRS  . NEPHROLITHOTOMY Left 02/07/2018   Procedure: SECOND LOOK NEPHROLITHOTOMY PERCUTANEOUS;  Surgeon: Alexis Frock, MD;  Location: [facility] ORS;  Service: Urology;  Laterality: Left;  2 HRS  . OVARIAN CYST REMOVAL    . TEE WITHOUT CARDIOVERSION N/A 12/23/2018   Procedure: TRANSESOPHAGEAL ECHOCARDIOGRAM (TEE);  Surgeon: Lelon Perla, MD;  Location: [facility] ENDOSCOPY;  Service: Cardiovascular;  Laterality: N/A;  . UNILATERAL SALPINGECTOMY     Pt unsure of which fallopian tube was removed  . WISDOM TOOTH EXTRACTION     Family History  Problem Relation Age of Onset  . Diabetes Paternal Grandfather   . COPD Paternal Grandfather   . Heart disease Paternal Grandfather   . Cancer Paternal Grandfather        bone  . Cancer Paternal Grandmother   . Heart disease Paternal Grandmother   . Cancer Father   . Hypertension Father   . Heart attack Father   . Alcohol abuse Father   . Depression Father   . Heart failure Mother   . Diabetes Mother   . Heart disease Mother   . Hyperlipidemia Mother   . Hypertension Mother   . Heart attack Mother   . Peripheral vascular disease Mother   . Depression Mother   . COPD Mother   . Hypertension Sister   . Heart attack Sister   . Stroke Maternal Grandmother   . Colon cancer Neg Hx   . Colon polyps Neg Hx   . Esophageal cancer Neg Hx   . Rectal cancer Neg Hx   . Stomach cancer Neg Hx    Social History   Socioeconomic History  . Marital status: Married    Spouse name: Not on file  . Number of children: Not on file  . Years of education: Not on file  . Highest education level: Not on file  Occupational History  . Not on file  Tobacco Use  . Smoking status: Current Every  Day Smoker    Packs/day: 1.00    Years: 37.00    Pack years: 37.00    Types: Cigarettes  . Smokeless tobacco: Never Used  Vaping Use  . Vaping Use: Never used  Substance and Sexual Activity  . Alcohol use: No    Alcohol/week: 0.0 standard drinks  . Drug use: No  . Sexual activity: Not Currently    Birth control/protection: I.U.D.    Comment: Mirena-1st intercourse 54 yo-More than 5 partners  Other Topics Concern  . Not on file  Social History Narrative  . Not on file   Social Determinants of Health   Financial Resource Strain:   . Difficulty of Paying Living Expenses:   Food Insecurity:   . Worried About Charity fundraiser in the Last Year:   . Arboriculturist in the Last Year:   Transportation Needs:   . Film/video editor (Medical):   Marland Kitchen Lack of Transportation (Non-Medical):   Physical Activity:   . Days of Exercise per Week:   . Minutes of Exercise per Session:   Stress:   . Feeling of Stress :   Social Connections:   . Frequency of Communication with Friends and  Family:   . Frequency of Social Gatherings with Friends and Family:   . Attends Religious Services:   . Active Member of Clubs or Organizations:   . Attends Archivist Meetings:   Marland Kitchen Marital Status:   Intimate Partner Violence:   . Fear of Current or Ex-Partner:   . Emotionally Abused:   Marland Kitchen Physically Abused:   . Sexually Abused:     Review of Systems: Review of Systems  Constitutional: Positive for weight loss (Clothes fitting loser recently, unintentional). Negative for chills and fever.  HENT: Positive for tinnitus. Negative for congestion, ear pain, hearing loss and sore throat.   Eyes: Positive for blurred vision. Negative for photophobia.  Respiratory: Positive for shortness of breath (Reported at onset of sxs 3 days ago, resolved). Negative for cough, sputum production and wheezing.   Cardiovascular: Positive for palpitations (at time of initial symptoms, now resolved). Negative  for chest pain.  Gastrointestinal: Positive for abdominal pain (chronic), diarrhea (chronic) and nausea. Negative for blood in stool, melena and vomiting.  Genitourinary: Positive for dysuria (chronic).  Musculoskeletal: Positive for neck pain. Negative for back pain, falls, joint pain and myalgias.  Neurological: Positive for dizziness, tingling (arms and legs, chronic), weakness and headaches. Negative for tremors, speech change, seizures and loss of consciousness.    Physical Exam: Blood pressure 104/89, pulse 76, temperature 98.6 F (37 C), temperature source Oral, resp. rate 19, SpO2 97 %. Physical Exam Constitutional:      General: She is not in acute distress.    Appearance: Normal appearance. She is obese.  HENT:     Head: Normocephalic and atraumatic.     Right Ear: External ear normal.     Left Ear: External ear normal.     Nose: Nose normal. No congestion or rhinorrhea.     Mouth/Throat:     Mouth: Mucous membranes are moist.     Pharynx: Oropharynx is clear. No oropharyngeal exudate or posterior oropharyngeal erythema.  Eyes:     General: No scleral icterus.       Right eye: No discharge.        Left eye: No discharge.     Extraocular Movements: Extraocular movements intact.     Conjunctiva/sclera: Conjunctivae normal.     Comments: Both pupils round and reactive to light. Anisocoria present. Right Pupil, 2 mm Left Pupil, 3 mm  Neck:     Vascular: No carotid bruit.     Comments: Pt reports some posterior, superior throbbing neck pain that is worse with movement. No signs of meningismus noted. Cardiovascular:     Rate and Rhythm: Normal rate and regular rhythm.     Heart sounds: Normal heart sounds. No murmur heard.  No friction rub. No gallop.   Pulmonary:     Effort: No respiratory distress.     Breath sounds: No stridor. No wheezing, rhonchi or rales.  Chest:     Chest wall: No tenderness.  Abdominal:     General: Abdomen is flat. There is no distension.      Palpations: Abdomen is soft.     Tenderness: There is abdominal tenderness (Chronic, associated with diarrhea). There is no guarding or rebound.  Musculoskeletal:        General: No swelling, tenderness or signs of injury.     Cervical back: Normal range of motion. No rigidity or tenderness.     Right lower leg: No edema.     Left lower leg: No edema.  Skin:  General: Skin is warm and dry.  Neurological:     Mental Status: She is alert and oriented to person, place, and time.     Cranial Nerves: No cranial nerve deficit.     Sensory: No sensory deficit.     Motor: No weakness.     Coordination: Coordination normal.     Deep Tendon Reflexes:     Reflex Scores:      Bicep reflexes are 1+ on the right side and 1+ on the left side.      Patellar reflexes are 1+ on the right side and 1+ on the left side.      Achilles reflexes are 0 on the right side and 0 on the left side.    Comments: Normal finger-to-nose, romberg, heel to shin, and RAM testing of hands and feet.  Psychiatric:        Mood and Affect: Mood normal.        Thought Content: Thought content normal.        Judgment: Judgment normal.     Lab results: CBC Latest Ref Rng & Units 09/23/2019 09/23/2019 08/30/2019  WBC 4.0 - 10.5 K/uL - 9.6 11.0(H)  Hemoglobin 12.0 - 15.0 g/dL 14.6 14.3 15.7(H)  Hematocrit 36 - 46 % 43.0 45.6 49.5(H)  Platelets 150 - 400 K/uL - 354 344   CMP Latest Ref Rng & Units 09/23/2019 09/23/2019 08/30/2019  Glucose 70 - 99 mg/dL 151(H) 152(H) 202(H)  BUN 6 - 20 mg/dL 23(H) 21(H) 24(H)  Creatinine 0.44 - 1.00 mg/dL 1.70(H) 1.69(H) 1.76(H)  Sodium 135 - 145 mmol/L 140 139 142  Potassium 3.5 - 5.1 mmol/L 2.6(LL) 2.7(LL) 3.1(L)  Chloride 98 - 111 mmol/L 100 100 102  CO2 22 - 32 mmol/L - 24 20(L)  Calcium 8.9 - 10.3 mg/dL - 9.3 10.3  Total Protein 6.5 - 8.1 g/dL - 6.8 7.5  Total Bilirubin 0.3 - 1.2 mg/dL - 0.4 0.6  Alkaline Phos 38 - 126 U/L - 116 105  AST 15 - 41 U/L - 23 22  ALT 0 - 44 U/L - 23 22     Imaging results:  CT HEAD WO CONTRAST  IMPRESSION: 1. No CT evidence of acute intracranial abnormality. If there is persisting concern for acute/subacute infarction, MRI is more sensitive and specific for early features of ischemia. 2. Stable appearance of encephalomalacia in the left occipital lobe and left corona radiata. 3. Stable parenchymal volume loss and chronic microvascular angiopathy. Electronically Signed   By: Lovena Le M.D.   On: 09/23/2019 01:38   MR BRAIN WO CONTRAST  IMPRESSION: 1. Multifocal patchy small volume acute ischemic nonhemorrhagic infarcts involving the cortical and subcortical cerebral hemispheres bilaterally, most pronounced posteriorly. Several of these foci are somewhat watershed in distribution, and could reflect a transient hypoperfusion injury. Correlation with history recommended. 2. 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 Electronically Signed   By: Jeannine Boga M.D.   On: 09/23/2019 05:47    Other results: EKG: normal sinus rhythm.  Assessment & Plan by Problem: Active Problems:   Dizziness  Pamela Carney is a 54 year old female with a PMH of CVA, DM, HLD, and HTN  who presents today with dizziness and several months of diarrhea who was admitted for probable hypoperfusion CVA.  #Stroke, multifocal MRI with multifocal ischemic infarcts involving cortical and subcortical cerebral hemispheres bilaterally, most pronounced posteriorly. Several somewhat watershed in distribution  and could reflect transient hypoperfusion. Also remote left PCA and left basal ganglia infarcts. Possibly contributing are chronic diarrhea and thiazide use leading to hypotension. Orthostatics negative. Carotid US with 80-99% L carotid stenosis. Will await MRA results for further evaluation of cerebral vasculature. A1c 7.5. Echo on previous admission without PFO. Lower  extremity doppler negative. -MRA pending -Continue to monitor for changes in status.  #Carotid Artery Stenosis Significant stenosis in ICA to 80-99%. This likely contributed to her presentation today. She will need further evaluation and management by vascular surgery. -Consult vascular surgery -Continue Atorvastatin 80 mg QD  #Hypotension, chronic diarrhea Pamela Carney presents with low blood pressures in the 010O systolic and several month history of diarrhea and thiazide usage. Will infuse as needed. -Start LR bolus 1061m/2hr, followed by -Start LR infusion 100 ml/hr -Start Midodrine 578mTID -Discontinue Indapimide -f/u GI panel  #Hypokalemia Potassium to 2.7 on presentation to ED. Possibly a result of her thiazide. Replete as necessary. -Start K-Chlor 10 meq/10046m 100m10m x 5 -Start Klor-con PO 40  #Diabetes Mellitus A1c at 7.5 and glucose at 152 today. Recently taken off metformin by PCP and has increased her home insulin usage to 21 units per day at night time to accommodate. -Start Insulin Aspart sliding scale -Start Insulin Glargine 7 units nightly -Continue Atorvastatin 80 mg QD, as above  #Hyperlipidemia Lipid panel today showed significant elevations in triglycerides to 455 and total cholesterol to 227. LDL at 114. This is a significant risk factor for stroke and other cardiovascular complications. -Continue Atorvastatin 80 mg QD, as above   This is a [employer] information officere.  The care of the patient was discussed with resident Dr. ChenBridgett Larsson the assessment and plan was formulated with their assistance.  Please see their note for official documentation of the patient encounter.   Signed: ErriCalvert Cantordical Student 09/23/2019, 9:31 AM   I have reviewed the medical student's note and agree with his assessment and plan. JoshBudd Palmer PGY-1, Internal Medicine Pager: [phone]

## 2019-09-23 NOTE — ED Notes (Signed)
Pt asleep, SpO2 decreased to 87% on RA. RN placed pt on 3L O2 via [place], SpO2 increased to 94%

## 2019-09-23 NOTE — ED Provider Notes (Signed)
[facility] Provider Note   CSN: [id number] Arrival date & time: 09/23/19  0036     History Chief Complaint  Patient presents with  . Dizziness  . Stroke Symptoms    Pamela Carney is a 54 y.o. female.  Patient presents to the emergency department with a chief complaint of dizziness.  She states that the symptoms started on Wednesday of this week (3 days ago).  She describes the dizziness as being off balance.  She states that when she walks, she feels like she is going to tip over.  She denies having experienced this before.  She reports that she chronically has low blood pressure due to medications that she takes, but she does not attribute her dizziness to this.  She denies any fever, chills, cough.  Denies any chest pain or shortness of breath.  She denies any numbness, weakness, or tingling.  Denies any vision changes or slurred speech.  The history is provided by the patient. No language interpreter was used.       Past Medical History:  Diagnosis Date  . Abnormal Pap smear   . AKI (acute kidney injury) ([place]) 12/28/2017  . Anxiety   . Arthritis    bil knees, neck  . Bipolar 1 disorder ([place])   . Cataract    Mild  . Cholelithiasis 12/23/2017   noted on CT renal, pt unaware  . Chronic kidney disease (CKD), stage III (moderate)   . Cystitis   . Depression   . Diabetes mellitus without complication ([place])    type 2  . Genital warts    Hx of genital  . GERD (gastroesophageal reflux disease)   . Heart murmur    childhood  . Hematuria   . History of blood transfusion   . History of ectopic pregnancy   . History of gestational diabetes   . History of kidney stones   . History of sepsis    after ectopic pregnancy  . Hyperlipidemia   . Hypertension   . Idiopathic peripheral neuropathy    both feet  . Leg ulcer, left ([place])   . Low back pain   . Migraines   . Obesity   . Oral candidiasis   . Pneumonia   . PONV  (postoperative nausea and vomiting)    prolonged sedation  . Recurrent UTI   . 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 Sigmoid diverticulosis 12/23/2017   noted on CT renal, pt unaware  . Sleep apnea    uses cpap  . Stroke [facility])    Cryptogenic  . Ulcer of foot ([place])    Left  . Weakness     Patient Active Problem List   Diagnosis Date Noted  . Acute renal failure (ARF) ([place]) 06/21/2019  . Nausea 06/21/2019  . Dehydration 06/21/2019  . HLD (hyperlipidemia) 06/21/2019  . Acute metabolic encephalopathy 88/89/1694  . UTI (urinary tract infection) 04/27/2019  . Cerebral embolism with cerebral infarction 12/22/2018  . Septic shock ([place]) 12/15/2018  . Ureteral stone with hydronephrosis   . Acute renal failure superimposed on stage 3 chronic kidney disease ([place])   . Type 2 diabetes mellitus with other specified complication ([place]) 50/38/8828  . Bipolar 1 disorder ([place]) 02/01/2018  . IUD (intrauterine device) in place 02/01/2018  . Obesity 02/01/2018  . Healthcare maintenance 02/01/2018  . CKD (chronic kidney disease), stage III 12/28/2017  .  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 Acute cystitis with hematuria   . Chronic interstitial nephritis 12/24/2017  . Generalized weakness 12/24/2017  . Sepsis ([place]) 12/24/2017  . Hypokalemia 12/24/2017  . Oral candidiasis 12/24/2017  . Neuropathy involving both lower extremities 01/31/2014  . Perimenopausal 12/29/2012  . Family planning, IUD (intrauterine device) check/reinsertion/removal 12/28/2012  . Pain 12/27/2012    Past Surgical History:  Procedure Laterality Date  . BUBBLE STUDY  12/23/2018   Procedure: BUBBLE STUDY;  Surgeon: Lelon Perla, MD;  Location: [facility] ENDOSCOPY;  Service: Cardiovascular;;  . CERVICAL CONE BIOPSY    . CESAREAN SECTION     x3  . CYSTOSCOPY W/ URETERAL STENT PLACEMENT Right 12/15/2018   Procedure: Cystoscopy, right  retrograde ureteropyelogram, fluoroscopic interpretation, right double-J stent placement (24 cm x 6 [place]);  Surgeon: Franchot Gallo, MD;  Location: [facility];  Service: Urology;  Laterality: Right;  . CYSTOSCOPY WITH RETROGRADE PYELOGRAM, URETEROSCOPY AND STENT PLACEMENT Right 01/27/2019   Procedure: CYSTOSCOPY WITH RETROGRADE PYELOGRAM, URETEROSCOPY AND STENT PLACEMENT; BASKET STONE RETRIEVAL;  Surgeon: Alexis Frock, MD;  Location[facility];  Service: Urology;  Laterality: Right;  75 MINS  . CYSTOSCOPY/URETEROSCOPY/HOLMIUM LASER/STENT PLACEMENT Right 02/04/2018   Procedure: CYSTOSCOPY/URETEROSCOPY/RETROGRADE PYELOGRAM/HOLMIUM LASER/STENT PLACEMENT;  Surgeon: Alexis Frock, MD;  Location: [facility];  Service: Urology;  Laterality: Right;  . CYSTOSCOPY/URETEROSCOPY/HOLMIUM LASER/STENT PLACEMENT Right 02/07/2018   Procedure: CYSTOSCOPY LEFT STENT EXCHANGE;  Surgeon: Alexis Frock, MD;  Location: [facility];  Service: Urology;  Laterality: Right;  . DILATION AND CURETTAGE OF UTERUS    . ECTOPIC PREGNANCY SURGERY    . IR NEPHROSTOMY PLACEMENT LEFT  12/25/2017  . LOOP RECORDER INSERTION N/A 12/23/2018   Procedure: LOOP RECORDER INSERTION;  Surgeon: Constance Haw, MD;  Location: [place] CV LAB;  Service: Cardiovascular;  Laterality: N/A;  . NEPHROLITHOTOMY Left 02/04/2018   Procedure: NEPHROLITHOTOMY PERCUTANEOUS;  Surgeon: Alexis Frock, MD;  Location: [facility];  Service: Urology;  Laterality: Left;  3 HRS  . NEPHROLITHOTOMY Left 02/07/2018   Procedure: SECOND LOOK NEPHROLITHOTOMY PERCUTANEOUS;  Surgeon: Alexis Frock, MD;  Location: [facility];  Service: Urology;  Laterality: Left;  2 HRS  . OVARIAN CYST REMOVAL    . TEE WITHOUT CARDIOVERSION N/A 12/23/2018   Procedure: TRANSESOPHAGEAL ECHOCARDIOGRAM (TEE);  Surgeon: Lelon Perla, MD;  Location: [facility] ENDOSCOPY;  Service: Cardiovascular;  Laterality: N/A;  . UNILATERAL SALPINGECTOMY     Pt unsure of which fallopian tube was removed  . WISDOM  TOOTH EXTRACTION       OB History    Gravida  7   Para  3   Term      Preterm  3   AB  2   Living  3     SAB  1   TAB      Ectopic  1   Multiple      Live Births  5           Family History  Problem Relation Age of Onset  . Diabetes Paternal Grandfather   . COPD Paternal Grandfather   . Heart disease Paternal Grandfather   . Cancer Paternal Grandfather        bone  . Cancer Paternal Grandmother   . Heart disease Paternal Grandmother   . Cancer Father   . Hypertension Father   . Heart attack Father   . Alcohol abuse Father   . Depression Father   . Heart failure Mother   . Diabetes Mother   . Heart  disease Mother   . Hyperlipidemia Mother   . Hypertension Mother   . Heart attack Mother   . Peripheral vascular disease Mother   . Depression Mother   . COPD Mother   . Hypertension Sister   . Heart attack Sister   . Stroke Maternal Grandmother   . Colon cancer Neg Hx   . Colon polyps Neg Hx   . Esophageal cancer Neg Hx   . Rectal cancer Neg Hx   . Stomach cancer Neg Hx     Social History   Tobacco Use  . Smoking status: Current Every Day Smoker    Packs/day: 1.00    Years: 37.00    Pack years: 37.00    Types: Cigarettes  . Smokeless tobacco: Never Used  Vaping Use  . Vaping Use: Never used  Substance Use Topics  . Alcohol use: No    Alcohol/week: 0.0 standard drinks  . Drug use: No    Home Medications Prior to Admission medications   Medication Sig Start Date End Date Taking? Authorizing Provider  atorvastatin (LIPITOR) 80 MG tablet Take 1 tablet (80 mg total) by mouth daily at 6 PM. 01/16/19   [name], NP  AZO CRANBERRY GUMMIES PO Take 2 tablets by mouth daily.    [provider]  Blood Glucose Monitoring Suppl (ONETOUCH VERIO) w/Device KIT Use to check blood sugars every morning fasting and 2 hours after largest meal 02/02/18   Danford, Valetta Fuller D, NP  cetirizine (ZYRTEC) 10 MG tablet Take 1 tablet (10 mg total) by  mouth daily. 05/25/19 08/30/19  [name], NP  DULoxetine (CYMBALTA) 60 MG capsule TAKE 1 CAPSULE BY MOUTH DAILY 09/13/19   Charlott Rakes, MD  fluticasone (FLONASE) 50 MCG/ACT nasal spray Place 1 spray into both nostrils daily. Patient not taking: Reported on 08/30/2019 06/24/19 09/22/19  Mercy Riding, MD  glucose blood (ONETOUCH VERIO) test strip Use to check blood sugars every morning fasting and 2 hours after largest meal 02/23/19   [name], NP  ibuprofen (ADVIL) 200 MG tablet Take 200 mg by mouth every 6 (six) hours as needed for moderate pain.    [provider]  indapamide (LOZOL) 2.5 MG tablet Take 2.5 mg by mouth every evening.    [provider]  insulin glargine (LANTUS) 100 UNIT/ML Solostar Pen Inject 15 Units into the skin at bedtime. 08/28/19   [name], NP  Insulin Pen Needle (PEN NEEDLES) 31G X 8 MM MISC Use as instructed. Inject into the skin once nightly. 08/28/19   [name], NP  metFORMIN (GLUCOPHAGE) 500 MG tablet Take 2 tablets (1,000 mg total) by mouth 2 (two) times daily with a meal. Patient not taking: Reported on 08/30/2019 05/25/19 08/23/19  [name], NP  Multiple Vitamins-Minerals (ONE-A-DAY WOMENS 50+ ADVANTAGE) TABS Take 1 tablet by mouth daily.    [provider]  ondansetron (ZOFRAN) 4 MG tablet Take 1 tablet (4 mg total) by mouth every 8 (eight) hours as needed for nausea or vomiting. Patient not taking: Reported on 08/30/2019 05/26/19   [name], NP  ONE [facility] LANCETS MISC Use to check blood sugars every morning fasting and 2 hours after largest meal 02/02/18   Danford, Valetta Fuller D, NP  potassium chloride SA (KLOR-CON) 20 MEQ tablet Take 1 tablet (20 mEq total) by mouth daily. 08/30/19   Caccavale, Sophia, PA-C  topiramate (TOPAMAX) 100 MG tablet Take 1 tablet (100 mg total) by mouth 2 (  two) times daily. Patient taking differently: Take 50 mg by mouth 2 (two) times daily.  06/27/19 06/26/20  Frann Rider, NP    Allergies     Sulfa antibiotics, Latex, and Tape  Review of Systems   Review of Systems  All other systems reviewed and are negative.   Physical Exam Updated Vital Signs BP 104/89 (BP Location: Right Arm)   Pulse 76   Temp 98.6 F (37 C) (Oral)   Resp 19   SpO2 97%   Physical Exam Vitals and nursing note reviewed.  Constitutional:      General: She is not in acute distress.    Appearance: She is well-developed.  HENT:     Head: Normocephalic and atraumatic.  Eyes:     Conjunctiva/sclera: Conjunctivae normal.  Cardiovascular:     Rate and Rhythm: Normal rate and regular rhythm.     Heart sounds: No murmur heard.   Pulmonary:     Effort: Pulmonary effort is normal. No respiratory distress.     Breath sounds: Normal breath sounds.  Abdominal:     Palpations: Abdomen is soft.     Tenderness: There is no abdominal tenderness.  Musculoskeletal:        General: Normal range of motion.     Cervical back: Neck supple.  Skin:    General: Skin is warm and dry.  Neurological:     Mental Status: She is alert and oriented to person, place, and time.     Comments: Normal finger-to-nose, no pronator drift, normal heel-to-shin  Psychiatric:        Mood and Affect: Mood normal.        Behavior: Behavior normal.     ED Results / Procedures / Treatments   Labs (all labs ordered are listed, but only abnormal results are displayed) Labs Reviewed  COMPREHENSIVE METABOLIC PANEL - Abnormal; Notable for the following components:      Result Value   Potassium 2.7 (*)    Glucose, Bld 152 (*)    BUN 21 (*)    Creatinine, Ser 1.69 (*)    Albumin 3.4 (*)    GFR calc non Af Amer 34 (*)    GFR calc Af Amer 40 (*)    All other components within normal limits  I-STAT CHEM 8, ED - Abnormal; Notable for the following components:   Potassium 2.6 (*)    BUN 23 (*)    Creatinine, Ser 1.70 (*)    Glucose, Bld 151 (*)    Calcium, Ion 1.13 (*)    All other components within normal limits  PROTIME-INR    APTT  CBC  DIFFERENTIAL  I-STAT BETA HCG BLOOD, ED ([facility], [facility], [facility] ONLY)  CBG MONITORING, ED    EKG None  Radiology CT HEAD WO CONTRAST  Result Date: 09/23/2019 CLINICAL DATA:  Concern for subacute stroke, dizziness since Thursday morning with gait disturbance EXAM: CT HEAD WITHOUT CONTRAST TECHNIQUE: Contiguous axial images were obtained from the base of the skull through the vertex without intravenous contrast. COMPARISON:  CT and MRI 04/27/2019 FINDINGS: Brain: Stable appearance of chronic encephalomalacia in the left occipital lobe. Additional focus hypoattenuation in the left corona radiata likely reflecting sequela of prior infarct as well. No convincing CT evidence of acute infarction, hemorrhage, hydrocephalus, extra-axial collection or mass lesion/mass effect. Symmetric prominence of the ventricles, cisterns and sulci compatible with parenchymal volume loss. Patchy areas of white matter hypoattenuation are most compatible with chronic microvascular angiopathy. Vascular: Atherosclerotic calcification of the  carotid siphons and intradural vertebral arteries. No hyperdense vessel. Skull: No calvarial fracture or suspicious osseous lesion. No scalp swelling or hematoma. Sinuses/Orbits: Paranasal sinuses and mastoid air cells are predominantly clear. Included orbital structures are unremarkable. Other: None IMPRESSION: 1. No CT evidence of acute intracranial abnormality. If there is persisting concern for acute/subacute infarction, MRI is more sensitive and specific for early features of ischemia. 2. Stable appearance of encephalomalacia in the left occipital lobe and left corona radiata. 3. Stable parenchymal volume loss and chronic microvascular angiopathy. Electronically Signed   By: Lovena Le M.D.   On: 09/23/2019 01:38    Procedures Procedures (including critical care time)  Medications Ordered in ED Medications  potassium chloride 10 mEq in 100 mL IVPB (has no administration in time  range)  sodium chloride flush (NS) 0.9 % injection 3 mL (3 mLs Intravenous Given 09/23/19 0347)  sodium chloride 0.9 % bolus 1,000 mL (0 mLs Intravenous Stopped 09/23/19 0451)  potassium chloride SA (KLOR-CON) CR tablet 40 mEq (40 mEq Oral Given 09/23/19 0347)  LORazepam (ATIVAN) injection 1 mg (1 mg Intravenous Given 09/23/19 0353)    ED Course  I have reviewed the triage vital signs and the nursing notes.  Pertinent labs & imaging results that were available during my care of the patient were reviewed by me and considered in my medical decision making (see chart for details).    MDM Rules/Calculators/A&P                          This patient complains of dizziness x3 days, this involves an extensive number of treatment options, and is a complaint that carries with it a high risk of complications and morbidity.  The differential diagnosis includes stroke, vertigo, dehydration.  Pertinent Labs I ordered, reviewed, and interpreted labs, which included potassium is 2.6, will supplement in the emergency department, creatinine 1.7, which is about baseline for the patient, glucose is 151.  Imaging Interpretation I ordered imaging studies which included CT head, which shows no acute stroke.    Medications I ordered medication potassium for hypokalemia  Patient signed out to [facility] [place], who will continue care.  Plan to reassess after MRI.  Patient will need to ambulate.  If she is stable no longer feeling dizzy, feel that patient can be discharged pending normal MRI and discharged home with some potassium.  If she is still feeling unsteady, consider consultation with neurology and/or hospitalist.   Final Clinical Impression(s) / ED Diagnoses Final diagnoses:  Dizziness    Rx / DC Orders ED Discharge Orders    None       Montine Circle, PA-C 09/23/19 [place], Delice Bison, DO 09/23/19 [phone]

## 2019-09-23 NOTE — Progress Notes (Signed)
VASCULAR LAB      Bilateral lower extremity venous duplex completed.    Preliminary report:  See CV proc for preliminary results.  Romon Devereux, RVT 09/23/2019, 11:03 AM

## 2019-09-23 NOTE — Progress Notes (Signed)
SLP Cancellation Note  Patient Details Name: Pamela Carney MRN: [id number] DOB: 06-11-1965   Cancelled treatment:       Reason Eval/Treat Not Completed: Patient at procedure or test/unavailable.   Sonia Baller, MA, CCC-SLP Speech Therapy [facility] Acute Rehab

## 2019-09-23 NOTE — ED Notes (Signed)
Pt transported to MRI via stretcher.  

## 2019-09-23 NOTE — Progress Notes (Signed)
VASCULAR LAB    Carotid duplex completed.    Preliminary report:  See CV proc for preliminary results.  Ichelle Harral, RVT 09/23/2019, 11:10 AM

## 2019-09-23 NOTE — ED Triage Notes (Signed)
Patient states she has been having dizziness since Thursday morning with some gait disturbances.  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  Patient with equal grip strength and pedal pushes.  Patient states she has been wobbly and nauseated due to the dizziness.

## 2019-09-23 NOTE — ED Notes (Signed)
This tech assisted pt onto bedside commode, pt appeared unsteady and reported feeling dizzy.

## 2019-09-23 NOTE — Consult Note (Addendum)
Hospital Consult    Reason for Consult: High-grade left carotid stenosis on duplex Referring Physician: Medicine MRN #:  [id number]  History of Present Illness: This is a 54 y.o. female with history of diabetes, chronic kidney disease, hypertension, hyperlipidemia that vascular surgery has been consulted for high-grade left carotid stenosis.  Patient presented to the ED with generalized weakness that she states started about 3 days ago.  States she felt like her legs were going to give out on her.  She felt both her arms and legs were weak at the same time.  She denies any focal neurologic symptoms or weakness on one side only.  At the same time she is been battling diarrhea having 3-4 bouts of profuse watery diarrhea daily for the last 4 months.  She states this also was a reason for her ED visit.  She was first evaluated by my partner Dr. Donzetta Matters in October 2020 for symptomatic carotid stenosis.  At the time had evidence of left occipital stroke on MRI and this was not felt to be symptomatic or related to her carotid artery disease after discussion with neurology.  CTA at that time showed showed right ICA stenosis of 70 to 80% and left ICA stenosis of 60%.  She was scheduled for follow-up in the office.  Past Medical History:  Diagnosis Date  . Abnormal Pap smear   . AKI (acute kidney injury) ([place]) 12/28/2017  . Anxiety   . Arthritis    bil knees, neck  . Bipolar 1 disorder ([place])   . Cataract    Mild  . Cholelithiasis 12/23/2017   noted on CT renal, pt unaware  . Chronic kidney disease (CKD), stage III (moderate)   . Cystitis   . Depression   . Diabetes mellitus without complication ([place])    type 2  . Genital warts    Hx of genital  . GERD (gastroesophageal reflux disease)   . Heart murmur    childhood  . Hematuria   . History of blood transfusion   . History of ectopic pregnancy   . History of gestational diabetes   . History of kidney stones   . History of sepsis    after  ectopic pregnancy  . Hyperlipidemia   . Hypertension   . Idiopathic peripheral neuropathy    both feet  . Leg ulcer, left ([place])   . Low back pain   . Migraines   . Obesity   . Oral candidiasis   . Pneumonia   . PONV (postoperative nausea and vomiting)    prolonged sedation  . Recurrent UTI   . 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 Sigmoid diverticulosis 12/23/2017   noted on CT renal, pt unaware  . Sleep apnea    uses cpap  . 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 Ulcer of foot ([place])    Left  . Weakness     Past Surgical History:  Procedure Laterality Date  . BUBBLE STUDY  12/23/2018   Procedure: BUBBLE STUDY;  Surgeon: Lelon Perla, MD;  Location: [facility] ENDOSCOPY;  Service: Cardiovascular;;  . CERVICAL CONE BIOPSY    . CESAREAN SECTION     x3  . CYSTOSCOPY W/ URETERAL STENT PLACEMENT Right 12/15/2018   Procedure: Cystoscopy, right retrograde ureteropyelogram, fluoroscopic interpretation, right double-J stent placement (24 cm x 6 [place]);  Surgeon: Franchot Gallo, MD;  Location: [facility];  Service:  Urology;  Laterality: Right;  . CYSTOSCOPY WITH RETROGRADE PYELOGRAM, URETEROSCOPY AND STENT PLACEMENT Right 01/27/2019   Procedure: CYSTOSCOPY WITH RETROGRADE PYELOGRAM, URETEROSCOPY AND STENT PLACEMENT; BASKET STONE RETRIEVAL;  Surgeon: Alexis Frock, MD;  Location: [facility] ORS;  Service: Urology;  Laterality: Right;  75 MINS  . CYSTOSCOPY/URETEROSCOPY/HOLMIUM LASER/STENT PLACEMENT Right 02/04/2018   Procedure: CYSTOSCOPY/URETEROSCOPY/RETROGRADE PYELOGRAM/HOLMIUM LASER/STENT PLACEMENT;  Surgeon: Alexis Frock, MD;  Location: [facility];  Service: Urology;  Laterality: Right;  . CYSTOSCOPY/URETEROSCOPY/HOLMIUM LASER/STENT PLACEMENT Right 02/07/2018   Procedure: CYSTOSCOPY LEFT STENT EXCHANGE;  Surgeon: Alexis Frock, MD;  Location: [facility];  Service: Urology;  Laterality: Right;  . DILATION AND CURETTAGE  OF UTERUS    . ECTOPIC PREGNANCY SURGERY    . IR NEPHROSTOMY PLACEMENT LEFT  12/25/2017  . LOOP RECORDER INSERTION N/A 12/23/2018   Procedure: LOOP RECORDER INSERTION;  Surgeon: Constance Haw, MD;  Location: [facility] CV LAB;  Service: Cardiovascular;  Laterality: N/A;  . NEPHROLITHOTOMY Left 02/04/2018   Procedure: NEPHROLITHOTOMY PERCUTANEOUS;  Surgeon: Alexis Frock, MD;  Location: [facility];  Service: Urology;  Laterality: Left;  3 HRS  . NEPHROLITHOTOMY Left 02/07/2018   Procedure: SECOND LOOK NEPHROLITHOTOMY PERCUTANEOUS;  Surgeon: Alexis Frock, MD;  Location: [facility];  Service: Urology;  Laterality: Left;  2 HRS  . OVARIAN CYST REMOVAL    . TEE WITHOUT CARDIOVERSION N/A 12/23/2018   Procedure: TRANSESOPHAGEAL ECHOCARDIOGRAM (TEE);  Surgeon: Lelon Perla, MD;  Location: [facility] ENDOSCOPY;  Service: Cardiovascular;  Laterality: N/A;  . UNILATERAL SALPINGECTOMY     Pt unsure of which fallopian tube was removed  . WISDOM TOOTH EXTRACTION      Allergies  Allergen Reactions  . Sulfa Antibiotics Hives and Swelling    Swelling site not recalled  . Latex Rash  . Tape Rash    Not tolerated well    Prior to Admission medications   Medication Sig Start Date End Date Taking? Authorizing Provider  atorvastatin (LIPITOR) 80 MG tablet Take 1 tablet (80 mg total) by mouth daily at 6 PM. 01/16/19  Yes [name], NP  AZO CRANBERRY GUMMIES PO Take 2 tablets by mouth daily.   Yes [provider]  Blood Glucose Monitoring Suppl (ONETOUCH VERIO) w/Device KIT Use to check blood sugars every morning fasting and 2 hours after largest meal Patient taking differently: 1 each by Other route See admin instructions. Use to check blood sugars every morning fasting and 2 hours after largest meal 02/02/18  Yes Danford, Katy D, NP  DULoxetine (CYMBALTA) 60 MG capsule TAKE 1 CAPSULE BY MOUTH DAILY Patient taking differently: Take 60 mg by mouth daily.  09/13/19  Yes Charlott Rakes, MD    fluconazole (DIFLUCAN) 150 MG tablet Take 150 mg by mouth once. 09/15/19  Yes [provider]  glucose blood (ONETOUCH VERIO) test strip Use to check blood sugars every morning fasting and 2 hours after largest meal Patient taking differently: 1 each by Other route See admin instructions. Use to check blood sugars every morning fasting and 2 hours after largest meal 02/23/19  Yes [name], NP  ibuprofen (ADVIL) 200 MG tablet Take 200 mg by mouth every 6 (six) hours as needed for moderate pain.   Yes [provider]  indapamide (LOZOL) 2.5 MG tablet Take 2.5 mg by mouth every evening.   Yes [provider]  Insulin Pen Needle (PEN NEEDLES) 31G X 8 MM MISC Use as instructed. Inject into the skin once nightly. Patient taking differently: 1 each  by Other route See admin instructions. Use as instructed. Inject into the skin once nightly. 08/28/19  Yes [name], NP  Multiple Vitamins-Minerals (ONE-A-DAY WOMENS 50+ ADVANTAGE) TABS Take 1 tablet by mouth daily.   Yes [provider]  ondansetron (ZOFRAN) 4 MG tablet Take 1 tablet (4 mg total) by mouth every 8 (eight) hours as needed for nausea or vomiting. 05/26/19  Yes [name], NP  ONE TOUCH LANCETS MISC Use to check blood sugars every morning fasting and 2 hours after largest meal Patient taking differently: 1 each by Other route See admin instructions. Use to check blood sugars every morning fasting and 2 hours after largest meal 02/02/18  Yes Danford, Valetta Fuller D, NP  potassium chloride SA (KLOR-CON) 20 MEQ tablet Take 1 tablet (20 mEq total) by mouth daily. 08/30/19  Yes Caccavale, Sophia, PA-C  topiramate (TOPAMAX) 100 MG tablet Take 1 tablet (100 mg total) by mouth 2 (two) times daily. Patient taking differently: Take 50 mg by mouth 2 (two) times daily.  06/27/19 06/26/20 Yes McCue, Janett Billow, NP  cetirizine (ZYRTEC) 10 MG tablet Take 1 tablet (10 mg total) by mouth daily. 05/25/19 08/30/19  [name], NP   fluticasone (FLONASE) 50 MCG/ACT nasal spray Place 1 spray into both nostrils daily. Patient not taking: Reported on 08/30/2019 06/24/19 09/22/19  Mercy Riding, MD  insulin glargine (LANTUS) 100 UNIT/ML Solostar Pen Inject 15 Units into the skin at bedtime. Patient taking differently: Inject 21 Units into the skin at bedtime.  08/28/19   [name], NP    Social History   Socioeconomic History  . Marital status: Married    Spouse name: Not on file  . Number of children: Not on file  . Years of education: Not on file  . Highest education level: Not on file  Occupational History  . Not on file  Tobacco Use  . Smoking status: Current Every Day Smoker    Packs/day: 1.00    Years: 37.00    Pack years: 37.00    Types: Cigarettes  . Smokeless tobacco: Never Used  Vaping Use  . Vaping Use: Never used  Substance and Sexual Activity  . Alcohol use: No    Alcohol/week: 0.0 standard drinks  . Drug use: No  . Sexual activity: Not Currently    Birth control/protection: I.U.D.    Comment: Mirena-1st intercourse 54 yo-More than 5 partners  Other Topics Concern  . Not on file  Social History Narrative  . Not on file   Social Determinants of Health   Financial Resource Strain:   . Difficulty of Paying Living Expenses:   Food Insecurity:   . Worried About Charity fundraiser in the Last Year:   . Arboriculturist in the Last Year:   Transportation Needs:   . Film/video editor (Medical):   Marland Kitchen Lack of Transportation (Non-Medical):   Physical Activity:   . Days of Exercise per Week:   . Minutes of Exercise per Session:   Stress:   . Feeling of Stress :   Social Connections:   . Frequency of Communication with Friends and Family:   . Frequency of Social Gatherings with Friends and Family:   . Attends Religious Services:   . Active Member of Clubs or Organizations:   . Attends Archivist Meetings:   Marland Kitchen Marital Status:   Intimate Partner Violence:   . Fear of Current  or Ex-Partner:   . Emotionally Abused:   .  Physically Abused:   . Sexually Abused:      Family History  Problem Relation Age of Onset  . Diabetes Paternal Grandfather   . COPD Paternal Grandfather   . Heart disease Paternal Grandfather   . Cancer Paternal Grandfather        bone  . Cancer Paternal Grandmother   . Heart disease Paternal Grandmother   . Cancer Father   . Hypertension Father   . Heart attack Father   . Alcohol abuse Father   . Depression Father   . Heart failure Mother   . Diabetes Mother   . Heart disease Mother   . Hyperlipidemia Mother   . Hypertension Mother   . Heart attack Mother   . Peripheral vascular disease Mother   . Depression Mother   . COPD Mother   . Hypertension Sister   . Heart attack Sister   . Stroke Maternal Grandmother   . Colon cancer Neg Hx   . Colon polyps Neg Hx   . Esophageal cancer Neg Hx   . Rectal cancer Neg Hx   . Stomach cancer Neg Hx     ROS: [x]  Positive   [ ]  Negative   [ ]  All sytems reviewed and are negative  Cardiovascular: []  chest pain/pressure []  palpitations []  SOB lying flat []  DOE []  pain in legs while walking []  pain in legs at rest []  pain in legs at night []  non-healing ulcers []  hx of DVT []  swelling in legs  Pulmonary: []  productive cough []  asthma/wheezing []  home O2  Neurologic: []  weakness in []  arms []  legs []  numbness in []  arms []  legs []  hx of CVA []  mini stroke [] difficulty speaking or slurred speech []  temporary loss of vision in one eye []  dizziness  Hematologic: []  hx of cancer []  bleeding problems []  problems with blood clotting easily  Endocrine:   []  diabetes []  thyroid disease  GI []  vomiting blood []  blood in stool  GU: []  CKD/renal failure []  HD--[]  M/W/F or []  T/T/S []  burning with urination []  blood in urine  Psychiatric: []  anxiety []  depression  Musculoskeletal: []  arthritis []  joint pain  Integumentary: []  rashes []   ulcers  Constitutional: []  fever []  chills   Physical Examination  Vitals:   09/23/19 1330 09/23/19 1346  BP: 126/86 121/80  Pulse: 73 71  Resp: 19 19  Temp:    SpO2: 93% 90%   There is no height or weight on file to calculate BMI.  General:  NAD Gait: Not observed HENT: WNL, normocephalic Pulmonary: normal non-labored breathing, without Rales, rhonchi,  wheezing Cardiac: regular, without  Murmurs, rubs or gallops Abdomen: soft, NT/ND, no masses Neurologic: A&O X 3; Appropriate Affect ; SENSATION: normal; MOTOR FUNCTION:  moving all extremities equally. Speech is fluent/normal   CBC    Component Value Date/Time   WBC 9.6 09/23/2019 0055   RBC 4.63 09/23/2019 0055   HGB 14.6 09/23/2019 0117   HGB 15.1 08/28/2019 1441   HCT 43.0 09/23/2019 0117   HCT 46.2 08/28/2019 1441   PLT 354 09/23/2019 0055   PLT 394 08/28/2019 1441   MCV 98.5 09/23/2019 0055   MCV 96 08/28/2019 1441   MCH 30.9 09/23/2019 0055   MCHC 31.4 09/23/2019 0055   RDW 13.2 09/23/2019 0055   RDW 14.5 08/28/2019 1441   LYMPHSABS 2.3 09/23/2019 0055   LYMPHSABS 2.8 10/05/2013 1532   MONOABS 0.7 09/23/2019 0055   EOSABS 0.1 09/23/2019 0055   EOSABS 0.1  10/05/2013 1532   BASOSABS 0.1 09/23/2019 0055   BASOSABS 0.0 10/05/2013 1532    BMET    Component Value Date/Time   NA 140 09/23/2019 0117   NA 142 08/28/2019 1441   K 2.6 (LL) 09/23/2019 0117   CL 100 09/23/2019 0117   CO2 24 09/23/2019 0055   GLUCOSE 151 (H) 09/23/2019 0117   BUN 23 (H) 09/23/2019 0117   BUN 29 (H) 08/28/2019 1441   CREATININE 1.70 (H) 09/23/2019 0117   CALCIUM 9.3 09/23/2019 0055   GFRNONAA 34 (L) 09/23/2019 0055   GFRAA 40 (L) 09/23/2019 0055    COAGS: Lab Results  Component Value Date   INR 0.9 09/23/2019   INR 1.1 12/15/2018   INR 1.38 12/23/2017     Non-Invasive Vascular Imaging:    MRI brain today showed patchy small volume acute ischemic nonhemorrhagic infarcts in the cortical subcortical hemispheres  bilaterally most pronounced posteriorly.  Carotid duplex today showed minimal 1 to 39% stenosis on the right and 80-99% stenosis on the left with disease extending into the mid ICA  ASSESSMENT/PLAN: This is a 54 y.o. female that vascular surgery has been consulted for a high-grade left ICA stenosis noted on duplex.   I did review her old CTA neck from 12/21/2018 when she was initially evaluated by Dr. Donzetta Matters for carotid disease and this showed right ICA stenosis of 70 to 80% and left ICA stenosis of 60%.  Discussed with the patient that I do not think her carotid disease is symptomatic at this time and would not recommend urgent surgical intervention.  Her MRI shows bilateral hemispheric infarcts most pronounced posteriorly and she had no focal neurologic symptoms on presentation.  She came in with generalized weakness over the last three days and profuse diarrhea for the last 4 months.  Presentation more suggestive of cardioembolic or hypoperfusion.  Appreciate neurology and medicine evaluation.  Would likely recommend follow-up with Dr. Donzetta Matters as an outpatient and he can then make a decision about elective intervention on the left carotid if he determines she is an appropriate surgery candidate.   Marty Heck, MD [facility] Office: [phone]

## 2019-09-24 ENCOUNTER — Inpatient Hospital Stay (HOSPITAL_COMMUNITY): Payer: 59

## 2019-09-24 ENCOUNTER — Encounter (HOSPITAL_COMMUNITY): Payer: Self-pay | Admitting: Internal Medicine

## 2019-09-24 DIAGNOSIS — I6310 Cerebral infarction due to embolism of unspecified precerebral artery: Secondary | ICD-10-CM | POA: Diagnosis not present

## 2019-09-24 DIAGNOSIS — F319 Bipolar disorder, unspecified: Secondary | ICD-10-CM | POA: Diagnosis present

## 2019-09-24 DIAGNOSIS — E876 Hypokalemia: Secondary | ICD-10-CM | POA: Diagnosis present

## 2019-09-24 DIAGNOSIS — K529 Noninfective gastroenteritis and colitis, unspecified: Secondary | ICD-10-CM | POA: Diagnosis present

## 2019-09-24 DIAGNOSIS — I6782 Cerebral ischemia: Secondary | ICD-10-CM | POA: Diagnosis present

## 2019-09-24 DIAGNOSIS — R197 Diarrhea, unspecified: Secondary | ICD-10-CM | POA: Diagnosis present

## 2019-09-24 DIAGNOSIS — Z8673 Personal history of transient ischemic attack (TIA), and cerebral infarction without residual deficits: Secondary | ICD-10-CM | POA: Diagnosis not present

## 2019-09-24 DIAGNOSIS — I129 Hypertensive chronic kidney disease with stage 1 through stage 4 chronic kidney disease, or unspecified chronic kidney disease: Secondary | ICD-10-CM | POA: Diagnosis present

## 2019-09-24 DIAGNOSIS — I959 Hypotension, unspecified: Secondary | ICD-10-CM | POA: Diagnosis present

## 2019-09-24 DIAGNOSIS — I6523 Occlusion and stenosis of bilateral carotid arteries: Secondary | ICD-10-CM | POA: Diagnosis present

## 2019-09-24 DIAGNOSIS — E861 Hypovolemia: Secondary | ICD-10-CM | POA: Diagnosis present

## 2019-09-24 DIAGNOSIS — R634 Abnormal weight loss: Secondary | ICD-10-CM | POA: Diagnosis present

## 2019-09-24 DIAGNOSIS — K551 Chronic vascular disorders of intestine: Secondary | ICD-10-CM | POA: Diagnosis present

## 2019-09-24 DIAGNOSIS — E669 Obesity, unspecified: Secondary | ICD-10-CM | POA: Diagnosis present

## 2019-09-24 DIAGNOSIS — F17210 Nicotine dependence, cigarettes, uncomplicated: Secondary | ICD-10-CM | POA: Diagnosis present

## 2019-09-24 DIAGNOSIS — E1122 Type 2 diabetes mellitus with diabetic chronic kidney disease: Secondary | ICD-10-CM | POA: Diagnosis present

## 2019-09-24 DIAGNOSIS — Z87440 Personal history of urinary (tract) infections: Secondary | ICD-10-CM | POA: Diagnosis not present

## 2019-09-24 DIAGNOSIS — R29700 NIHSS score 0: Secondary | ICD-10-CM | POA: Diagnosis present

## 2019-09-24 DIAGNOSIS — Z20822 Contact with and (suspected) exposure to covid-19: Secondary | ICD-10-CM | POA: Diagnosis present

## 2019-09-24 DIAGNOSIS — Z6838 Body mass index (BMI) 38.0-38.9, adult: Secondary | ICD-10-CM | POA: Diagnosis not present

## 2019-09-24 DIAGNOSIS — I6340 Cerebral infarction due to embolism of unspecified cerebral artery: Secondary | ICD-10-CM | POA: Diagnosis present

## 2019-09-24 DIAGNOSIS — E785 Hyperlipidemia, unspecified: Secondary | ICD-10-CM | POA: Diagnosis present

## 2019-09-24 DIAGNOSIS — Z87442 Personal history of urinary calculi: Secondary | ICD-10-CM | POA: Diagnosis not present

## 2019-09-24 DIAGNOSIS — N1831 Chronic kidney disease, stage 3a: Secondary | ICD-10-CM | POA: Diagnosis present

## 2019-09-24 DIAGNOSIS — N390 Urinary tract infection, site not specified: Secondary | ICD-10-CM | POA: Diagnosis present

## 2019-09-24 DIAGNOSIS — R7989 Other specified abnormal findings of blood chemistry: Secondary | ICD-10-CM | POA: Diagnosis present

## 2019-09-24 LAB — URINALYSIS, ROUTINE W REFLEX MICROSCOPIC
Bilirubin Urine: NEGATIVE
Glucose, UA: NEGATIVE mg/dL
Ketones, ur: NEGATIVE mg/dL
Nitrite: POSITIVE — AB
Protein, ur: NEGATIVE mg/dL
Specific Gravity, Urine: 1.009 (ref 1.005–1.030)
WBC, UA: >50 WBC/hpf — ABNORMAL HIGH (ref 0–5)
pH: 6 (ref 5.0–8.0)

## 2019-09-24 LAB — GASTROINTESTINAL PANEL BY PCR, STOOL (REPLACES STOOL CULTURE)

## 2019-09-24 LAB — CBC
HCT: 38.5 % (ref 36.0–46.0)
Hemoglobin: 12 g/dL (ref 12.0–15.0)
MCH: 31.6 pg (ref 26.0–34.0)
MCHC: 31.2 g/dL (ref 30.0–36.0)
MCV: 101.3 fL — ABNORMAL HIGH (ref 80.0–100.0)
Platelets: 283 10*3/uL (ref 150–400)
RBC: 3.8 MIL/uL — ABNORMAL LOW (ref 3.87–5.11)
RDW: 13.2 % (ref 11.5–15.5)
WBC: 7.5 10*3/uL (ref 4.0–10.5)
nRBC: 0 % (ref 0.0–0.2)

## 2019-09-24 LAB — LIPID PANEL
Cholesterol: 224 mg/dL — ABNORMAL HIGH (ref 0–200)
HDL: 28 mg/dL — ABNORMAL LOW (ref >40)
LDL Cholesterol: UNDETERMINED mg/dL (ref 0–99)
Total CHOL/HDL Ratio: 8 RATIO
Triglycerides: 468 mg/dL — ABNORMAL HIGH (ref <150)
VLDL: UNDETERMINED mg/dL (ref 0–40)

## 2019-09-24 LAB — COMPREHENSIVE METABOLIC PANEL
ALT: 20 U/L (ref 0–44)
AST: 18 U/L (ref 15–41)
Albumin: 2.8 g/dL — ABNORMAL LOW (ref 3.5–5.0)
Alkaline Phosphatase: 97 U/L (ref 38–126)
Anion gap: 10 (ref 5–15)
BUN: 14 mg/dL (ref 6–20)
CO2: 23 mmol/L (ref 22–32)
Calcium: 9.2 mg/dL (ref 8.9–10.3)
Chloride: 106 mmol/L (ref 98–111)
Creatinine, Ser: 1.17 mg/dL — ABNORMAL HIGH (ref 0.44–1.00)
GFR calc Af Amer: >60 mL/min (ref >60)
GFR calc non Af Amer: 54 mL/min — ABNORMAL LOW (ref >60)
Glucose, Bld: 138 mg/dL — ABNORMAL HIGH (ref 70–99)
Potassium: 3 mmol/L — ABNORMAL LOW (ref 3.5–5.1)
Sodium: 139 mmol/L (ref 135–145)
Total Bilirubin: 0.6 mg/dL (ref 0.3–1.2)
Total Protein: 5.6 g/dL — ABNORMAL LOW (ref 6.5–8.1)

## 2019-09-24 LAB — C-REACTIVE PROTEIN: CRP: 0.8 mg/dL (ref <1.0)

## 2019-09-24 LAB — HIV ANTIBODY (ROUTINE TESTING W REFLEX): HIV Screen 4th Generation wRfx: NONREACTIVE

## 2019-09-24 LAB — SEDIMENTATION RATE: Sed Rate: 20 mm/hr (ref 0–22)

## 2019-09-24 LAB — GLUCOSE, CAPILLARY
Glucose-Capillary: 148 mg/dL — ABNORMAL HIGH (ref 70–99)
Glucose-Capillary: 220 mg/dL — ABNORMAL HIGH (ref 70–99)
Glucose-Capillary: 129 mg/dL — ABNORMAL HIGH (ref 70–99)

## 2019-09-24 LAB — PROTEIN C, TOTAL: Protein C, Total: 108 % (ref 60–150)

## 2019-09-24 LAB — TSH: TSH: 1.029 u[IU]/mL (ref 0.350–4.500)

## 2019-09-24 LAB — LDL CHOLESTEROL, DIRECT: Direct LDL: 112.7 mg/dL — ABNORMAL HIGH (ref 0–99)

## 2019-09-24 LAB — ANTITHROMBIN III: AntiThromb III Func: 107 % (ref 75–120)

## 2019-09-24 IMAGING — CT CT ABD-PELV W/ CM
2 of 5 series · 14 of 46 positions shown, 16 images · IV contrast (APPLIED)
Comparison: CT [DATE]

CLINICAL DATA: Right lower quadrant pain, several months of
diarrhea

EXAM:
CT ABDOMEN AND PELVIS WITH CONTRAST
TECHNIQUE: Multidetector CT imaging of the abdomen and pelvis was performed
using the standard protocol following bolus administration of
intravenous contrast.
CONTRAST:  100mL OMNIPAQUE IOHEXOL 300 MG/ML  SOLN

[Series 3: abdomen 5.0 · axial · 0.98mm/px · z∈[-498,-98]mm · 11 of 96 slices shown, 13 images]
[im 8/96  soft-tissue]
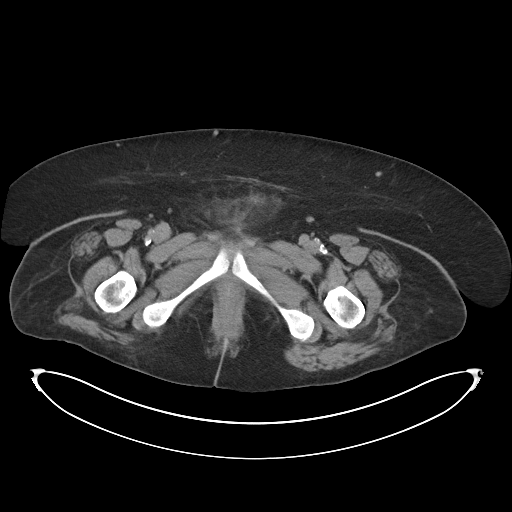
[im 8/96  bone]
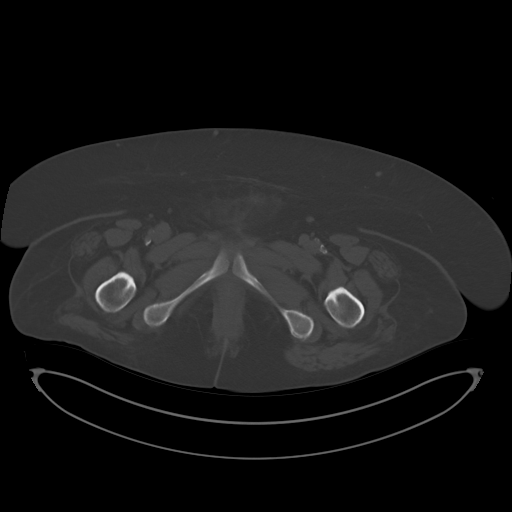
[im 16/96  soft-tissue]
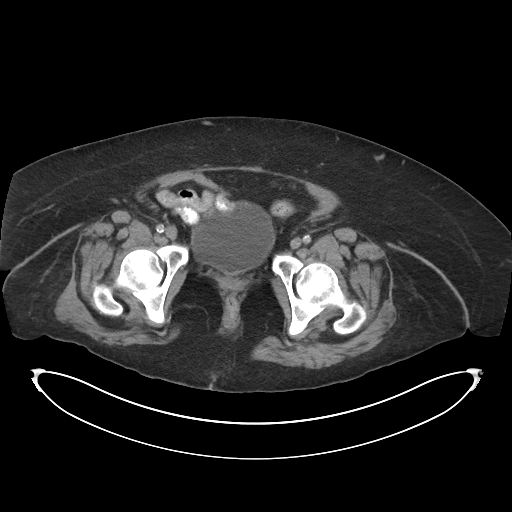
[im 24/96  soft-tissue]
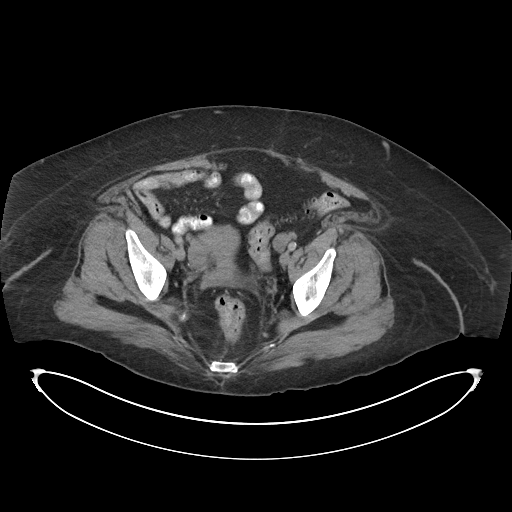
[im 32/96  soft-tissue]
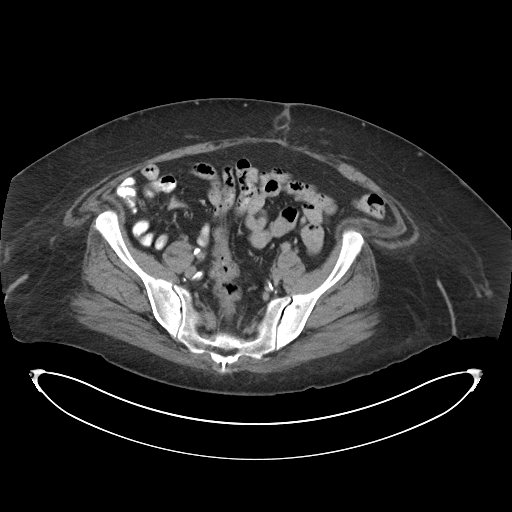
[im 40/96  soft-tissue]
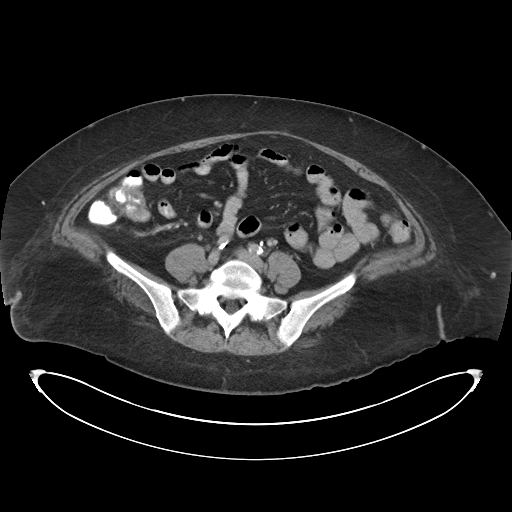
[im 48/96  soft-tissue]
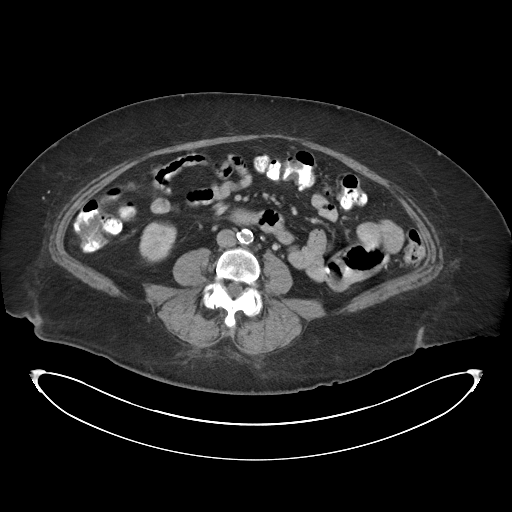
[im 56/96  soft-tissue]
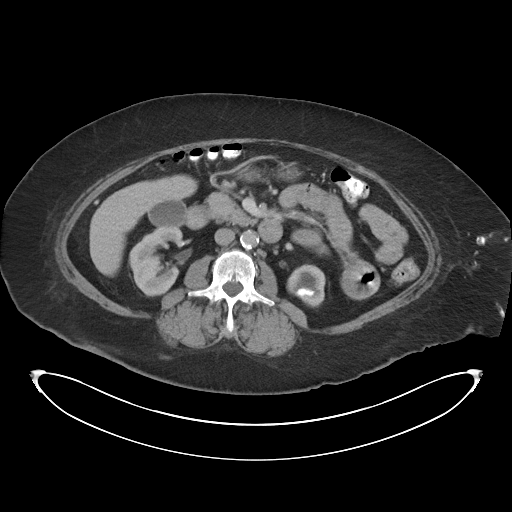
[im 64/96  soft-tissue]
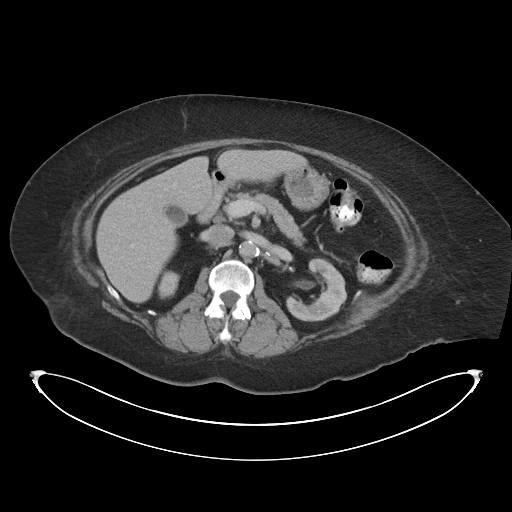
[im 72/96  soft-tissue]
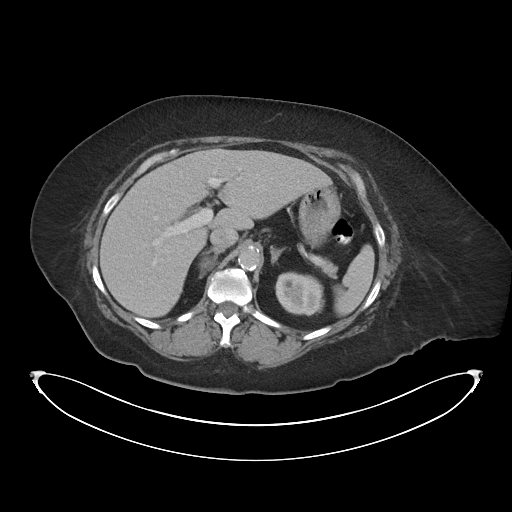
[im 72/96  bone]
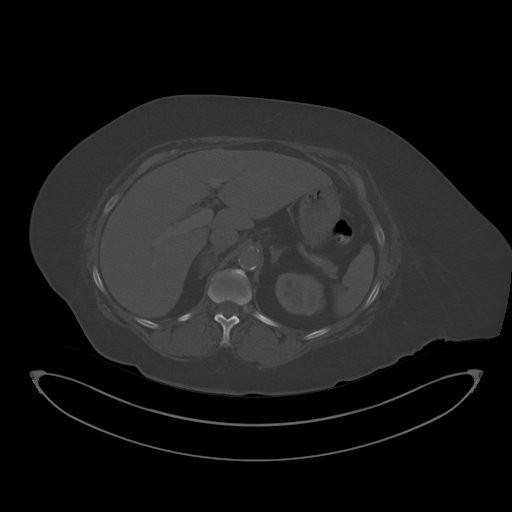
[im 80/96  soft-tissue]
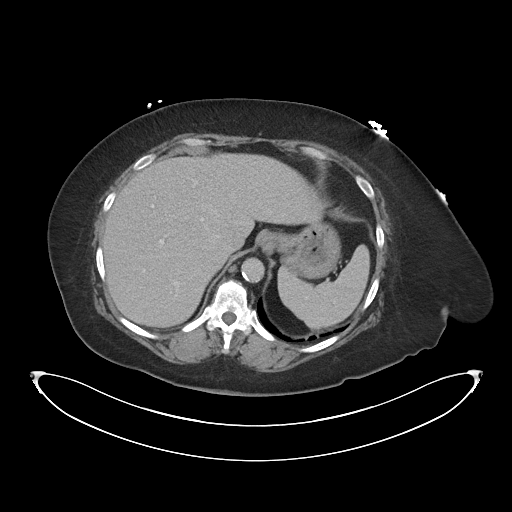
[im 88/96  soft-tissue]
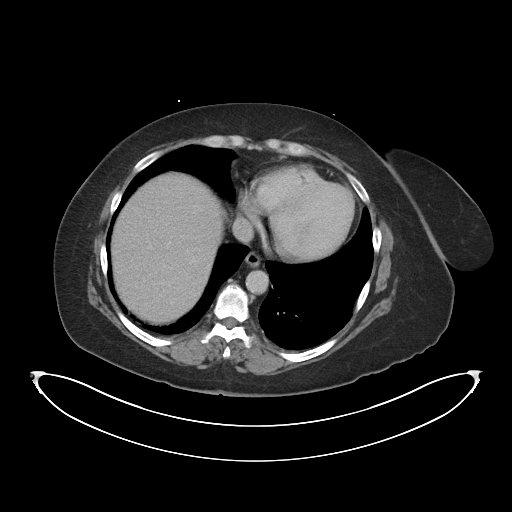

[Series 6: abdomen 3.0 mpr cor · coronal · 0.87mm/px · 3 of 90 slices shown]
[im 30/90  soft-tissue]
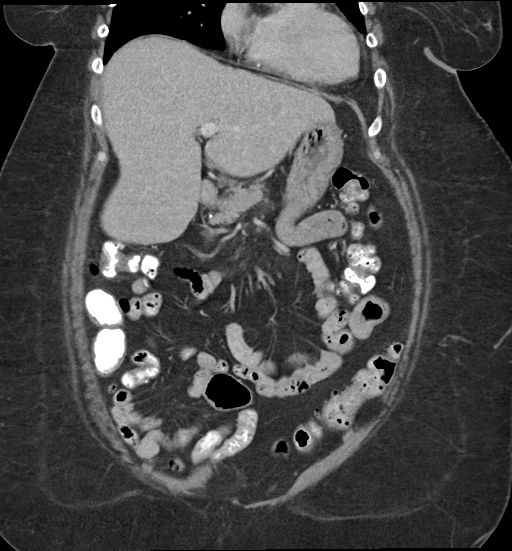
[im 40/90  soft-tissue]
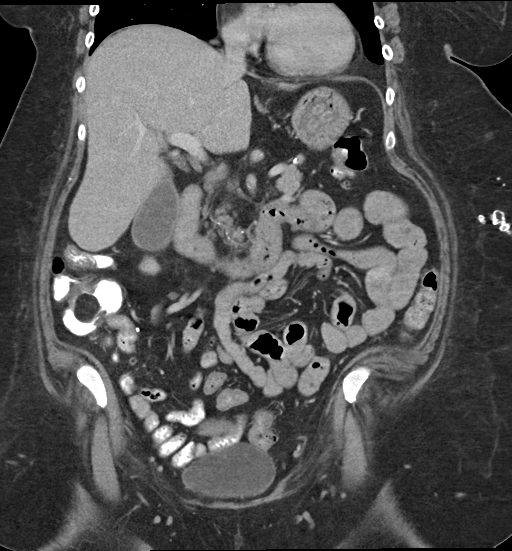
[im 50/90  soft-tissue]
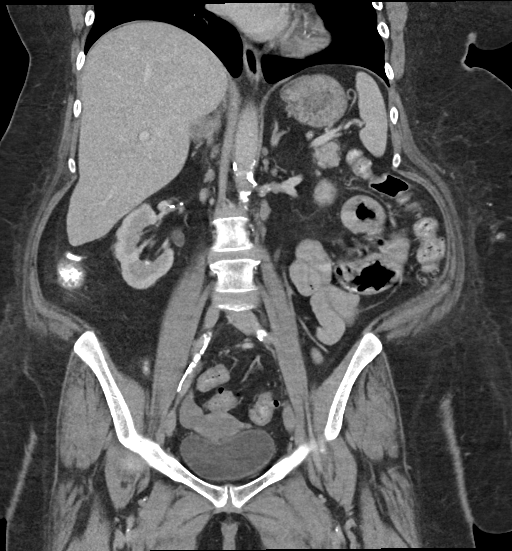

[14 of 46 positions shown; findings below may reference images not displayed]

FINDINGS: Lower chest: Bandlike areas of scarring and/or atelectasis in the
otherwise clear lung bases. Normal heart size. No pericardial
effusion. Coronary artery calcifications are present. Few
calcifications of the aortic leaflets as well.

Hepatobiliary: No worrisome focal liver abnormality is seen. Normal
gallbladder. No visible calcified gallstones. No biliary ductal
dilatation.

Pancreas: Unremarkable. No pancreatic ductal dilatation or
surrounding inflammatory changes.

Spleen: Normal in size without focal abnormality.

Adrenals/Urinary Tract: Stable size of a 3.1 cm right adrenal nodule
measuring less than 10 Hounsfield units on comparison noncontrast CT
exams and most compatible with a benign adenoma. No other concerning
adrenal nodules. Kidneys enhance and excrete symmetrically. Slightly
asymmetric right renal atrophy with some upper pole scarring.
Additional scarring in the lower pole left kidney. No obstructing
urolithiasis or hydronephrosis. Few scattered nonobstructive
collecting system calcifications, largest in the interpolar and
lower pole left kidney, are similar to the comparison CT. No
worrisome renal lesions. Scattered subcentimeter hypertension foci
in both kidneys, left more numerous than right, too small to fully
characterize on CT imaging but statistically likely benign. The
urinary bladder is largely decompressed at the time of exam and
therefore poorly evaluated by CT imaging. No gross bladder
abnormality.

Stomach/Bowel: Distal esophagus, stomach and duodenal sweep are
unremarkable. No small bowel wall thickening or dilatation. No
evidence of obstruction. A normal appendix is visualized. No colonic
dilatation or frank wall thickening though much of the colon is
decompressed distally. Scattered colonic diverticula without focal
inflammation to suggest diverticulitis.

Vascular/Lymphatic: There is extensive calcified noncalcified
atheromatous plaque throughout the abdominal aorta and branch
vessels. Suspected mild-to-moderate narrowing at the celiac axis
with more moderate to high-grade stenosis at the ostia of the SMA
and IMA. Vessels are opacified distally possibly augmented by
collateral flow. Additional renal artery stenoses are likely present
as well. Additional narrowing is suspected at the iliac artery
origins as well. These findings are incompletely assessed on this
non angiographic technique. Mild fusiform ectasia of the right
common iliac artery could reflect some poststenotic dilatation
measuring up to 1.2 cm with a more proximal vessel caliber 0.8 cm.

No suspicious or enlarged lymph nodes in the included lymphatic
chains.

Reproductive: Anteverted uterus.  No concerning adnexal lesions.

Other: No abdominopelvic free fluid or free gas. No bowel containing
hernias. Small fat containing umbilical hernia. Small focus of gas
in the right anterior abdominal subcutaneous tissues possibly
related to injectable use (3/66). Multiple chronic soft tissue
calcifications noted in the left flank as well.

Musculoskeletal: Slight asymmetric atrophy of the medial right and
inferior left rectus sheath, possibly postsurgical in nature. Mild
anterior abdominal wall laxity. Slight pelvic floor laxity noted as
well. Multilevel degenerative changes are present in the imaged
portions of the spine, hips and pelvis. No acute osseous abnormality
or suspicious osseous lesion.
IMPRESSION: 1. Extensive Aortic Atherosclerosis ([HM]-[HM]). Multilevel plaque
narrowing throughout the splanchnic and renal arteries, incompletely
assessed on this non angiographic technique, but with likely
moderate to severe narrowing of the SMA and IMA origin. Findings
could be seen in the setting of a chronic mesenteric ischemic
process.
2. No other acute or suggestive features to provide cause for
patient's symptoms.
3. Stable right adrenal adenoma.
4. Bilateral renal scarring, left greater than right. Nonobstructing
left nephrolithiasis.
5. Aortic Atherosclerosis ([HM]-[HM]).

## 2019-09-24 MED ORDER — IOHEXOL 9 MG/ML PO SOLN
500.0000 mL | ORAL | Status: AC
  Administered 2019-09-24 (×2): 500 mL via ORAL

## 2019-09-24 MED ORDER — IOHEXOL 300 MG/ML  SOLN
100.0000 mL | Freq: Once | INTRAMUSCULAR | Status: AC | PRN
  Administered 2019-09-24: 100 mL via INTRAVENOUS

## 2019-09-24 MED ORDER — POTASSIUM CHLORIDE CRYS ER 20 MEQ PO TBCR
40.0000 meq | EXTENDED_RELEASE_TABLET | Freq: Two times a day (BID) | ORAL | Status: DC
  Administered 2019-09-24 – 2019-09-25 (×2): 40 meq via ORAL
  Filled 2019-09-24 (×3): qty 2

## 2019-09-24 MED ORDER — IOHEXOL 300 MG/ML  SOLN: 100.0000 mL | Freq: Once | INTRAMUSCULAR | Status: DC | PRN

## 2019-09-24 MED ORDER — ENOXAPARIN SODIUM 40 MG/0.4ML ~~LOC~~ SOLN
40.0000 mg | SUBCUTANEOUS | Status: DC
  Administered 2019-09-24 – 2019-09-25 (×2): 40 mg via SUBCUTANEOUS
  Filled 2019-09-24 (×2): qty 0.4

## 2019-09-24 NOTE — Evaluation (Signed)
Occupational Therapy Evaluation Patient Details Name: Pamela Carney MRN: [id number] DOB: Apr 12, 1965 Today's Date: 09/24/2019    History of Present Illness Pt is a 54 y.o. female s/p dizziness, weakness, diarrhea. MRI: b/l posterior cortical and subcortical multifocal ischemic infarcts; remote L PCA and L basal ganglia infarcts. PMHx diabetes, chronic kidney disease, hypertension, hyperlipidemia that vascular surgery has been consulted for high-grade left carotid stenosis with non emergent need for surgery at this time, cryptogenic stroke in 11/2018 with MRI demonstrating L medial occipital lobe infarct.   Clinical Impression   Pt PTA: pt was living with family and reports independence. Pt currently limited by weakness mostly in BLEs, decreased ability to care for self and decreased activity tolerance. Pt supervisionA to minA overall for ADL tasks. Pt minguardA for mobility in room with RW. Pt continues to feel "weird" with no sensation changes mostly weakness in BLEs. Pt fatigues easily as pt experiencing diarrhea multiple times/day. Pt would benefit from continued OT skilled services. OT following acutely.      Follow Up Recommendations  No OT follow up    Equipment Recommendations  None recommended by OT    Recommendations for Other Services       Precautions / Restrictions Precautions Precautions: Fall Restrictions Weight Bearing Restrictions: No      Mobility Bed Mobility               General bed mobility comments: up in recliner pre and post session  Transfers Overall transfer level: Needs assistance Equipment used: Rolling walker (2 wheeled) Transfers: Sit to/from [facility] Sit to Stand: Min guard Stand pivot transfers: Min guard       General transfer comment: minguardA for safety    Balance Overall balance assessment: Needs assistance   Sitting balance-Leahy Scale: Good     Standing balance support: Bilateral upper extremity  supported Standing balance-Leahy Scale: Poor Standing balance comment: UE support for balance at sink for light grooming                           ADL either performed or assessed with clinical judgement   ADL Overall ADL's : Needs assistance/impaired Eating/Feeding: Modified independent;Sitting   Grooming: Supervision/safety;Standing   Upper Body Bathing: Supervision/ safety;Standing   Lower Body Bathing: Supervison/ safety;Sitting/lateral leans;Sit to/from stand   Upper Body Dressing : Supervision/safety;Standing   Lower Body Dressing: Supervision/safety;Sitting/lateral leans;Sit to/from stand   Toilet Transfer: Designer, fashion/clothing and Hygiene: Minimal assistance;Sitting/lateral lean;Sit to/from Sales promotion account executive Details (indicate cue type and reason): Assist for posterior pericare     Functional mobility during ADLs: Min guard;Rolling walker;Cueing for safety General ADL Comments: Pt limited by weakness mostly in BLEs, decreased ability to care for self and decreased activity tolerance.     Vision Baseline Vision/History: Wears glasses Wears Glasses: At all times Patient Visual Report: No change from baseline Vision Assessment?: Yes Eye Alignment: Within Functional Limits Ocular Range of Motion: Within Functional Limits Alignment/Gaze Preference: Within Defined Limits Tracking/Visual Pursuits: Able to track stimulus in all quads without difficulty     Perception     Praxis      Pertinent Vitals/Pain Pain Assessment: No/denies pain     Hand Dominance Right   Extremity/Trunk Assessment Upper Extremity Assessment Upper Extremity Assessment: Overall WFL for tasks assessed   Lower Extremity Assessment Lower Extremity Assessment: Generalized weakness;Defer to PT evaluation   Cervical / Trunk Assessment Cervical / Trunk  Assessment: Normal   Communication Communication Communication: No difficulties    Cognition Arousal/Alertness: Awake/alert Behavior During Therapy: WFL for tasks assessed/performed Overall Cognitive Status: Within Functional Limits for tasks assessed                                     General Comments  Pt reports generalized weakness; not on one side and mostly BLEs. Pt with bouts of diarrhea so pt fatigues easily at this time.    Exercises     Shoulder Instructions      Home Living Family/patient expects to be discharged to:: Private residence Living Arrangements: Spouse/significant other;Children Available Help at Discharge: Family;Available 24 hours/day Type of Home: House Home Access: Stairs to enter [employer] of Steps: 3 Entrance Stairs-Rails: None Home Layout: Two level Alternate Level Stairs-Number of Steps: flight (has been sleeping on couch downstairs and using full bath downstairs   Bathroom Shower/Tub: Teacher, early years/pre: Standard     Home Equipment: Latina Craver - 2 wheels;Shower seat          Prior Functioning/Environment Level of Independence: Independent        Comments: no driving since stroke due to vision deficits; was independent         OT Problem List: Decreased activity tolerance;Decreased strength      OT Treatment/Interventions: Self-care/ADL training;Therapeutic exercise;Neuromuscular education;Energy conservation;DME and/or AE instruction;Therapeutic activities;Balance training;Patient/family education    OT Goals(Current goals can be found in the care plan section) Acute Rehab OT Goals Patient Stated Goal: to return to independent OT Goal Formulation: With patient Time For Goal Achievement: 10/08/19 Potential to Achieve Goals: Good ADL Goals Additional ADL Goal #1: pt will increase to modified independence for OOB ADL tasks to increase independence. Additional ADL Goal #2: Pt will utilize/recall 3 energy conservation strategies to increase independence.  OT  Frequency: Min 2X/week   Barriers to D/C:            Co-evaluation              AM-PAC OT "6 Clicks" Daily Activity     Outcome Measure Help from another person eating meals?: None Help from another person taking care of personal grooming?: A Little Help from another person toileting, which includes using toliet, bedpan, or urinal?: A Little Help from another person bathing (including washing, rinsing, drying)?: A Little Help from another person to put on and taking off regular upper body clothing?: A Little Help from another person to put on and taking off regular lower body clothing?: A Little 6 Click Score: 19   End of Session Equipment Utilized During Treatment: Gait belt Nurse Communication: Mobility status  Activity Tolerance: Patient tolerated treatment well Patient left: in chair;with call bell/phone within reach  OT Visit Diagnosis: Unsteadiness on feet (R26.81);Muscle weakness (generalized) (M62.81)                Time: 0865-7846 OT Time Calculation (min): 15 min Charges:  OT General Charges $OT Visit: 1 Visit OT Evaluation $OT Eval Moderate Complexity: 1 Mod  Jefferey Pica, OTR/L Acute Rehabilitation Services Pager: [phone] Office: [phone]   Akhilesh Sassone C 09/24/2019, 3:51 PM

## 2019-09-24 NOTE — Evaluation (Addendum)
Physical Therapy Evaluation Patient Details Name: Pamela Carney MRN: [id number] DOB: 1966/01/02 Today's Date: 09/24/2019   History of Present Illness  Pt is a 54 y.o. female s/p dizziness, weakness, diarrhea. MRI: b/l posterior cortical and subcortical multifocal ischemic infarcts; remote L PCA and L basal ganglia infarcts. PMHx diabetes, chronic kidney disease, hypertension, hyperlipidemia that vascular surgery has been consulted for high-grade left carotid stenosis with non emergent need for surgery at this time, cryptogenic stroke in 11/2018 with MRI demonstrating L medial occipital lobe infarct.  Clinical Impression  Patient presents with decreased mobility due to generalized weakness, decreased balance, decreased activity tolerance, decreased safety awareness and she will benefit from skilled PT in the acute setting to allow return home with family support and recommend follow up HHPT.  Recommended family support for stairs, bathing and using RW at home for safety.    Orthostatic VS for the past 24 hrs (Last 3 readings):  BP- Lying Pulse- Lying BP- Sitting Pulse- Sitting BP- Standing at 0 minutes Pulse- Standing at 0 minutes  09/24/19 0929 99/69 55 94/63 64 101/84 69    Follow Up Recommendations Home health PT    Equipment Recommendations  None recommended by PT    Recommendations for Other Services       Precautions / Restrictions Precautions Precautions: Fall      Mobility  Bed Mobility Overal bed mobility: Modified Independent                Transfers Overall transfer level: Needs assistance Equipment used: Rolling walker (2 wheeled) Transfers: Sit to/from [facility] Sit to Stand: Min guard Stand pivot transfers: Min guard       General transfer comment: up from EOB and decided to use BSC prior to ambulation, assist for safe transition, backing up  Ambulation/Gait Ambulation/Gait assistance: Min guard Gait Distance (Feet): 12  Feet Assistive device: Rolling walker (2 wheeled) Gait Pattern/deviations: Step-through pattern;Decreased stride length;Trunk flexed     General Gait Details: limited to in room mobility due to frequent stools, used BSC prior to ambulation, but felt unsafe for out of room at this time; assist for balance, maneuvering around bed  Stairs            Wheelchair Mobility    Modified Rankin (Stroke Patients Only)       Balance Overall balance assessment: Needs assistance   Sitting balance-Leahy Scale: Good     Standing balance support: Bilateral upper extremity supported Standing balance-Leahy Scale: Poor Standing balance comment: UE support for balance                             Pertinent Vitals/Pain Pain Assessment: No/denies pain    Home Living Family/patient expects to be discharged to:: Private residence Living Arrangements: Spouse/significant other;Children (son, daughter and husband)   Type of Home: House Home Access: Stairs to enter Entrance Stairs-Rails: None Entrance Stairs-Number of Steps: 3 Home Layout: Two level Home Equipment: Cane - quad;Walker - 2 wheels;Shower seat      Prior Function Level of Independence: Independent         Comments: no driving since stroke due to vision deficits; was independent till couple days PTA needing help from her daughter to get to bathroom     Hand Dominance   Dominant Hand: Right    Extremity/Trunk Assessment   Upper Extremity Assessment Upper Extremity Assessment: Overall WFL for tasks assessed    Lower Extremity Assessment Lower  Extremity Assessment: Generalized weakness (peripheral neuropathy)       Communication   Communication: No difficulties  Cognition Arousal/Alertness: Awake/alert Behavior During Therapy: WFL for tasks assessed/performed Overall Cognitive Status: Within Functional Limits for tasks assessed                                        General  Comments General comments (skin integrity, edema, etc.): Reporting did not feel the one sided weakness expected from a stroke; states has fequent UTI's but antibiotics don't work    Exercises     Assessment/Plan    PT Assessment Patient needs continued PT services  PT Problem List Decreased strength;Decreased mobility;Decreased safety awareness;Decreased balance;Decreased knowledge of use of DME;Decreased knowledge of precautions       PT Treatment Interventions DME instruction;Therapeutic activities;Gait training;Therapeutic exercise;Patient/family education;Functional mobility training;Balance training;Stair training    PT Goals (Current goals can be found in the Care Plan section)  Acute Rehab PT Goals Patient Stated Goal: to return to independent PT Goal Formulation: With patient Time For Goal Achievement: 10/08/19 Potential to Achieve Goals: Good    Frequency Min 4X/week   Barriers to discharge        Co-evaluation               AM-PAC PT "6 Clicks" Mobility  Outcome Measure Help needed turning from your back to your side while in a flat bed without using bedrails?: None Help needed moving from lying on your back to sitting on the side of a flat bed without using bedrails?: None Help needed moving to and from a bed to a chair (including a wheelchair)?: A Little Help needed standing up from a chair using your arms (e.g., wheelchair or bedside chair)?: A Little Help needed to walk in hospital room?: A Little Help needed climbing 3-5 steps with a railing? : A Little 6 Click Score: 20    End of Session   Activity Tolerance: Patient tolerated treatment well Patient left: in chair;with call bell/phone within reach   PT Visit Diagnosis: Other abnormalities of gait and mobility (R26.89);Muscle weakness (generalized) (M62.81)    Time: 7342-8768 PT Time Calculation (min) (ACUTE ONLY): 39 min   Charges:   PT Evaluation $PT Eval Moderate Complexity: 1 Mod PT  Treatments $Gait Training: 8-22 mins $Self Care/Home Management: [place] TLXBW:[phone] Office:[phone] 09/24/2019   Reginia Naas 09/24/2019, 10:43 AM

## 2019-09-24 NOTE — Evaluation (Signed)
Clinical/Bedside Swallow Evaluation Patient Details  Name: Pamela Carney MRN: [id number] Date of Birth: 02/08/1966  Today's Date: 09/24/2019 Time: SLP Start Time (ACUTE ONLY): 0845 SLP Stop Time (ACUTE ONLY): 0855 SLP Time Calculation (min) (ACUTE ONLY): 10 min  Past Medical History:  Past Medical History:  Diagnosis Date  . Abnormal Pap smear   . AKI (acute kidney injury) ([place]) 12/28/2017  . Anxiety   . Arthritis    bil knees, neck  . Bipolar 1 disorder ([place])   . Cataract    Mild  . Cholelithiasis 12/23/2017   noted on CT renal, pt unaware  . Chronic kidney disease (CKD), stage III (moderate)   . Cystitis   . Depression   . Diabetes mellitus without complication ([place])    type 2  . Genital warts    Hx of genital  . GERD (gastroesophageal reflux disease)   . Heart murmur    childhood  . Hematuria   . History of blood transfusion   . History of ectopic pregnancy   . History of gestational diabetes   . History of kidney stones   . History of sepsis    after ectopic pregnancy  . Hyperlipidemia   . Hypertension   . Idiopathic peripheral neuropathy    both feet  . Leg ulcer, left ([place])   . Low back pain   . Migraines   . Obesity   . Oral candidiasis   . Pneumonia   . PONV (postoperative nausea and vomiting)    prolonged sedation  . Recurrent UTI   . 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 Sigmoid diverticulosis 12/23/2017   noted on CT renal, pt unaware  . Sleep apnea    uses cpap  . Stroke [facility])    Cryptogenic  . Ulcer of foot ([place])    Left  . Weakness    Past Surgical History:  Past Surgical History:  Procedure Laterality Date  . BUBBLE STUDY  12/23/2018   Procedure: BUBBLE STUDY;  Surgeon: Lelon Perla, MD;  Location: [facility] ENDOSCOPY;  Service: Cardiovascular;;  . CERVICAL CONE BIOPSY    . CESAREAN SECTION     x3  . CYSTOSCOPY W/ URETERAL STENT PLACEMENT Right  12/15/2018   Procedure: Cystoscopy, right retrograde ureteropyelogram, fluoroscopic interpretation, right double-J stent placement (24 cm x 6 [place]);  Surgeon: Franchot Gallo, MD;  Location: [place];  Service: Urology;  Laterality: Right;  . CYSTOSCOPY WITH RETROGRADE PYELOGRAM, URETEROSCOPY AND STENT PLACEMENT Right 01/27/2019   Procedure: CYSTOSCOPY WITH RETROGRADE PYELOGRAM, URETEROSCOPY AND STENT PLACEMENT; BASKET STONE RETRIEVAL;  Surgeon: Alexis Frock, MD;  Location: [facility];  Service: Urology;  Laterality: Right;  75 MINS  . CYSTOSCOPY/URETEROSCOPY/HOLMIUM LASER/STENT PLACEMENT Right 02/04/2018   Procedure: CYSTOSCOPY/URETEROSCOPY/RETROGRADE PYELOGRAM/HOLMIUM LASER/STENT PLACEMENT;  Surgeon: Alexis Frock, MD;  Location: [facility];  Service: Urology;  Laterality: Right;  . CYSTOSCOPY/URETEROSCOPY/HOLMIUM LASER/STENT PLACEMENT Right 02/07/2018   Procedure: CYSTOSCOPY LEFT STENT EXCHANGE;  Surgeon: Alexis Frock, MD;  Location: [facility];  Service: Urology;  Laterality: Right;  . DILATION AND CURETTAGE OF UTERUS    . ECTOPIC PREGNANCY SURGERY    . IR NEPHROSTOMY PLACEMENT LEFT  12/25/2017  . LOOP RECORDER INSERTION N/A 12/23/2018   Procedure: LOOP RECORDER INSERTION;  Surgeon: Constance Haw, MD;  Location: [place] [facility];  Service: Cardiovascular;  Laterality: N/A;  . NEPHROLITHOTOMY Left 02/04/2018   Procedure: NEPHROLITHOTOMY PERCUTANEOUS;  Surgeon: Tresa Moore,  Hubbard Robinson, MD;  Location: [facility];  Service: Urology;  Laterality: Left;  3 HRS  . NEPHROLITHOTOMY Left 02/07/2018   Procedure: SECOND LOOK NEPHROLITHOTOMY PERCUTANEOUS;  Surgeon: Alexis Frock, MD;  Location: [facility];  Service: Urology;  Laterality: Left;  2 HRS  . OVARIAN CYST REMOVAL    . TEE WITHOUT CARDIOVERSION N/A 12/23/2018   Procedure: TRANSESOPHAGEAL ECHOCARDIOGRAM (TEE);  Surgeon: Lelon Perla, MD;  Location: [facility] ENDOSCOPY;  Service: Cardiovascular;  Laterality: N/A;  . UNILATERAL SALPINGECTOMY     Pt unsure of  which fallopian tube was removed  . WISDOM TOOTH EXTRACTION     HPI:  Pamela Carney is a 54 y.o. female with a PMHx of severe intracranial and extracranial atherosclerotic disease, CVA, DM, HTN and HLD who presented to the [facility] ED with a c/c of dizziness   Assessment / Plan / Recommendation Clinical Impression  Pt demonstrates no signs of dysphagia. Able to drink 3 oz consecutively. Recommend pt resume regular diet.  SLP Visit Diagnosis: Dysphagia, unspecified (R13.10)    Aspiration Risk  Mild aspiration risk    Diet Recommendation Regular;Thin liquid   Liquid Administration via: Cup;Straw Supervision: Patient able to self feed    Other  Recommendations     Follow up Recommendations None      Frequency and Duration            Prognosis        Swallow Study   General HPI: Pamela Carney is a 54 y.o. female with a PMHx of severe intracranial and extracranial atherosclerotic disease, CVA, DM, HTN and HLD who presented to the [facility] ED with a c/c of dizziness Type of Study: Bedside Swallow Evaluation Previous Swallow Assessment: Prior BSE, WNL Diet Prior to this Study: NPO Temperature Spikes Noted: No Respiratory Status: Room air History of Recent Intubation: No Behavior/Cognition: Alert;Cooperative;Pleasant mood Oral Cavity Assessment: Within Functional Limits Oral Care Completed by SLP: No Oral Cavity - Dentition: Adequate natural dentition Vision: Functional for self-feeding Self-Feeding Abilities: Able to feed self Patient Positioning: Upright in bed Baseline Vocal Quality: Normal Volitional Cough: Strong Volitional Swallow: Able to elicit    Oral/Motor/Sensory Function Overall Oral Motor/Sensory Function: Within functional limits   Ice Chips     Thin Liquid Thin Liquid: Within functional limits Presentation: Cup;Straw    Nectar Thick Nectar Thick Liquid: Not tested   Honey Thick Honey Thick Liquid: Not tested   Puree Puree: Within functional limits    Solid     Solid: Within functional limits      Nelli Swalley, Katherene Ponto 09/24/2019,9:00 AM

## 2019-09-24 NOTE — Progress Notes (Signed)
STROKE TEAM PROGRESS NOTE   HISTORY OF PRESENT ILLNESS (per record) Pamela Carney is a 54 y.o. female with a PMHx of severe intracranial and extracranial atherosclerotic disease, CVA, DM, HTN and HLD who presented to the [facility] ED with a c/c of dizziness. MRI revealed strokes and Neurology was consulted.  Per patient, she went to sleep on Thursday 09/21/19 at 0100 and was completely normal. When she woke up at about 1000 am to use the bathroom she noticed that she was dizzy (described as light headed sensation) and she had trouble walking to the bathroom. Patient does not take a daily ASA. She stopped taking it since her last stroke. Her PCP stopped it per patient.  Patient denies ETOH, drug use. Endorses smoking 1/2 pack of cigarettes per day. LSN: 09/20/19 tPA Given: no outside of window.    INTERVAL HISTORY She continues to feel a little lightheaded.  She has some numbness in the legs, but not in the face or arms.  Denies any weakness.      OBJECTIVE Vitals:   09/24/19 0200 09/24/19 0300 09/24/19 0400 09/24/19 0816  BP:    (!) 113/57  Pulse:      Resp: 20 14 18 18   Temp:    97.8 F (36.6 C)  TempSrc:    Oral  SpO2:    96%  Weight:      Height:        CBC:  Recent Labs  Lab 09/23/19 0055 09/23/19 0055 09/23/19 0117 09/24/19 0615  WBC 9.6  --   --  7.5  NEUTROABS 6.4  --   --   --   HGB 14.3   < > 14.6 12.0  HCT 45.6   < > 43.0 38.5  MCV 98.5  --   --  101.3*  PLT 354  --   --  283   < > = values in this interval not displayed.    Basic Metabolic Panel:  Recent Labs  Lab 09/23/19 0117 09/23/19 0927 09/23/19 1750 09/24/19 0615  NA   < >  --  138 139  K   < >  --  3.3* 3.0*  CL   < >  --  106 106  CO2   < >  --  24 23  GLUCOSE   < >  --  169* 138*  BUN   < >  --  16 14  CREATININE   < >  --  1.34* 1.17*  CALCIUM   < >  --  8.7* 9.2  MG  --  2.0  --   --   PHOS  --  3.0  --   --    < > = values in this interval not displayed.    Lipid Panel:     Component  Value Date/Time   CHOL 224 (H) 09/24/2019 0630   TRIG 468 (H) 09/24/2019 0630   HDL 28 (L) 09/24/2019 0630   CHOLHDL 8.0 09/24/2019 0630   VLDL UNABLE TO CALCULATE IF TRIGLYCERIDE OVER 400 mg/dL 09/24/2019 0630   LDLCALC UNABLE TO CALCULATE IF TRIGLYCERIDE OVER 400 mg/dL 09/24/2019 0630   HgbA1c:  Lab Results  Component Value Date   HGBA1C 7.5 (H) 09/23/2019   Urine Drug Screen:     Component Value Date/Time   LABOPIA NONE DETECTED 04/27/2019 1843   COCAINSCRNUR NONE DETECTED 04/27/2019 1843   LABBENZ NONE DETECTED 04/27/2019 1843   AMPHETMU NONE DETECTED 04/27/2019 1843   THCU NONE DETECTED  04/27/2019 1843   LABBARB NONE DETECTED 04/27/2019 1843    Alcohol Level No results found for: [facility]  IMAGING      Transthoracic Echocardiogram  00/00/2021 Pending    Bilateral Carotid Dopplers  00/00/2021 Pending   ECG - SR rate 83 BPM. (See cardiology reading for complete details)   PHYSICAL EXAM Blood pressure (!) 113/57, pulse (!) 57, temperature 97.8 F (36.6 C), temperature source Oral, resp. rate 18, height 5' 2"  (1.575 m), weight 95.9 kg, SpO2 96 %.  Awake, alert, fully oriented. PERL, EOMI, Face symmetrical, tongue midline. Language- fluent, comprehension,naming,repetition - intact. Strength 5/5 BUE and BLE. Sensory - inconsistent. Coord - FTN intact bilateral.      ASSESSMENT/PLAN Pamela Carney is a 54 y.o. female with history of severe intracranial and extracranial atherosclerotic disease, bipolar disorder, CVA, tobacco use, DM, HTN and HLD who presented to the [facility] ED with a c/c of dizziness - MRI revealed strokes. She did not receive IV t-PA due to late presentation (>4.5 hours from time of onset).  Stroke- multifocal  Resultant  Numbness  Code Stroke CT Head -      CT head -  MRI head -  MRA head -   CTA H&N -   CT Perfusion -  Carotid Doppler - pending  2D Echo - pending  Sars Corona Virus 2 - negative  LDL unable to calculate  due to elevated triglycerides > 400  HgbA1c - 7.5  UDS - not ordered  VTE prophylaxis - Lovenox Diet  Diet Order            Diet regular Room service appropriate? Yes; Fluid consistency: Thin  Diet effective now                 No antithrombotic prior to admission, now on aspirin 81 mg daily  Patient counseled to be compliant with her antithrombotic medications  Ongoing aggressive stroke risk factor management  Therapy recommendations:  pending  Disposition:  Pending  Hypertension  Home BP meds: Lozol ; Potassium ; Topamax  Current BP meds: none   Stable . Permissive hypertension (OK if < 220/120) but gradually normalize in 5-7 days  . Long-term BP goal normotensive  Hyperlipidemia  Home Lipid lowering medication: Lipitor 80 mg daily  LDL unable to calculate due to elevated triglycerides > 400, goal < 70  Current lipid lowering medication: Lipitor 80 mg daily   Continue statin at discharge  Diabetes  Home diabetic meds: insulin  Current diabetic meds: insulin  HgbA1c 7.5, goal < 7.0 Recent Labs    09/23/19 1645 09/24/19 0812 09/24/19 1212  GLUCAP 161* 148* 220*    Other Stroke Risk Factors  Cigarette smoker - advised to stop smoking  Obesity, Body mass index is 38.67 kg/m., recommend weight loss, diet and exercise as appropriate   Family hx stroke (grandmother)   Hx stroke  OSA - uses cpap  Other Active Problems  Code status - Full code CKD - stage 3a - creatinine - 1.34->1.17   Hypokalemia - 3.0 - supplemented      Hospital day # 0  Assessment/Plan:  MRI shows bifrontal, biparietal, bioccipital ischemic infarcts, likely embolic.  She has not had history of a. Fib, and tele so far shows NSR.  MRA Brain was motion degraded, but prior CTAs have shown intracranial narrowing.  CDUS shows left ICA 80-99% stenosis, but this is not the cause of her current strokes and I agree that surgery can be deferred  as per vascular surgeon  recommendations.  TTE is negative, but I will order TEE with loop implantation for further work up.  No anticoagulation until we get more objective data.  Cont ASA for now.    Hypercoagulable work up is pending except for AT3 and ESR normal.    Autoimmune work up is also pending.  Chronic diarrhea - stool bacterial/parasite PCR panel pending.    U/A positive for UTI.    Chronic lightheadedness likely due to dehydration from diarrhea.  She is on Midodrine now.  Arta Silence, MS, MD   To contact Stroke Continuity provider, please refer to [email or web address] After hours, contact General Neurology

## 2019-09-24 NOTE — Progress Notes (Signed)
[facility] [place]  Subjective  -no new neurologic events overnight.  States she still feels dizzy when she sits up.   Objective (!) 113/57 (!) 57 97.8 F (36.6 C) (Oral) 18 96%  Intake/Output Summary (Last 24 hours) at 09/24/2019 1048 Last data filed at 09/24/2019 0819 Gross per 24 hour  Intake 2878.3 ml  Output 1450 ml  Net 1428.3 ml    Neurologic exam grossly intact while sitting in the chair with no appreciable focal weakness  Laboratory Lab Results: Recent Labs    09/23/19 0055 09/23/19 0055 09/23/19 0117 09/24/19 0615  WBC 9.6  --   --  7.5  HGB 14.3   < > 14.6 12.0  HCT 45.6   < > 43.0 38.5  PLT 354  --   --  283   < > = values in this interval not displayed.   BMET Recent Labs    09/23/19 1750 09/24/19 0615  NA 138 139  K 3.3* 3.0*  CL 106 106  CO2 24 23  GLUCOSE 169* 138*  BUN 16 14  CREATININE 1.34* 1.17*  CALCIUM 8.7* 9.2    COAG Lab Results  Component Value Date   INR 0.9 09/23/2019   INR 1.1 12/15/2018   INR 1.38 12/23/2017   No results found for: PTT  Assessment/Planning:  54 year old female the vascular surgery was consulted for high-grade left ICA stenosis on duplex after admission for generalized weakness and chronic diarrhea.  Again discussed with the patient this morning that I do not think her carotid disease is symptomatic at this time and would not recommend urgent surgical intervention.  Her MRI shows bilateral hemispheric infarcts most pronounced posteriorly and she had no focal neurologic symptoms on presentation.  Presentation more suggestive of cardioembolic or hypoperfusion.  Appreciate neurology and medicine evaluation.  Unless neurology feels otherwise, will recommend follow-up with Dr. Donzetta Matters as an outpatient and he can then make a decision about elective intervention on the left carotid.     Marty Heck 09/24/2019 10:48 AM --

## 2019-09-24 NOTE — Progress Notes (Addendum)
Subjective: Pamela Carney reports that she still feels dizzy when she sits up. She is still experiencing diarrhea and had 2 episodes yesterday. She also endorses leg pain that is deep in her R leg. She had a negative doppler yesterday. She reiterated that her diarrhea has been ongoing since April. Initially she thought it was due to her metformin but the diarrhea did not resolve when the metformin was stopped. She states that she has lost about 60-70 pounds over a span of a year. Her weight loss did not subside when she was started on insulin. Her bowel movements are not related to diet.   Objective:  Vital signs in last 24 hours: Vitals:   09/24/19 0105 09/24/19 0200 09/24/19 0300 09/24/19 0400  BP:      Pulse:      Resp: 20 20 14 18   Temp:      TempSrc:      SpO2:      Weight:      Height:        Physical Exam Constitutional: no acute distress Head: atraumatic ENT: external ears normal Eyes: EOMI, L pupil 3.55mm and R 43mm, pupils both reactive to light Cardiovascular: regular rate and rhythm, normal heart sounds Pulmonary: effort normal, lungs clear to ascultation bilaterally Abdominal: obese, flat, diffuse mild tenderness, no rebound tenderness, bowel sounds normal Musculoskeletal: no R leg swelling or tenderness Skin: warm and dry Neurological: alert, oriented, CN intact besides anisocoria noted above, sensation intact throughout, no dysmetria or dysdiachokinesia, RUE  Psychiatric: normal mood and affect  Assessment/Plan: Pamela Carney is a 54 y.o. female with hx of HTN, HLD, DM, CKD stage III, smoking, prior cryptogenic stroke in September 2020, OSA, migraines presenting with 3 days of dizziness, also found to be volume deplete and hypokalemic to 2.7. On MRI, was found to have multifocal ischemic stroke, etiology is shower of emboli vs hypoperfusion. She has also had chronic diarrhea, and indapamide use may be compounding her hypokalemia.  Active Problems:   Cerebrovascular  accident (CVA) ([place])   Dizziness  Stroke, multifocal MRI with multifocal ischemic infarcts involving cortical and subcortical cerebral hemispheres bilaterally, most pronounced posteriorly. Several somewhat watershed in distribution and could reflect transient hypoperfusion. Also remote left PCA and left basal ganglia infarcts. Possibly contributing are chronic diarrhea and thiazide use leading to hypotension. Orthostatics negative. Carotid US with 80-99% L carotid stenosis. Will await MRA results for further evaluation of cerebral vasculature. A1c 7.5. Echo on previous admission without PFO. Lower extremity doppler negative. -f/u labs per neuro recs: homocysteine, TSH, ANA. Cardiolipin, prothrombin gene mutation, factor 5 leiden, beta-2-glycoprotein, lupus anticoagulant, protein S, protein C, AT 3 -negative sed rate, CRP -f/u OT, PT, and Spech recommendations -telemetry monitoring -counsel on smoking cessation -Continue to monitor for changes in status.  Hypokalemia Potassium to 2.7 on presentation to ED. Improved to 3.3 yesterday evening, then 3.0 this morning. Likely secondary to chronic diarrhea and indipamide use.  Repleting as necessary. -Replete Klor-con 40mg  PO BID -holding indipamide  Hypotension Presented with BP in 17-408 systolic, imprved to 144-818 here with fluids, midodrine, and holding indipamide. Orthostatics negative. Several month history of diarrhea and thiazide usage.  -continue LR infusion 100 ml/hr -continue Midodrine 5mg  TID -holding Indapimide  Chronic diarrhea Reports diarrhea 4 times daily since April, associated with weight loss and presented with K 2.7. States that her stools are greasy. Declined stool studies by PCP as she did not mind the diarrhea. Has had diarrhea x2 since admission, comparable  to her baseline.  -f/u GI panel -f/u pancreatic elastase -f/u fecal lactoferrin -f/u fecal ova and parasite -f/u HIV screen  Diabetes Mellitus A1c at 7.5 and  glucose at 152 today. Recently taken off metformin by PCP and has increased her home insulin usage to 21 units per day at night time to accommodate. -continue Insulin Aspart sliding scale -continue Insulin Glargine 7 units nightly  Hyperlipidemia Fasting lipid panel with TG 468, cholesterol 224, HDL 28, LDL 112.  -continue Atorvastatin 80 mg QD  Carotid Artery Stenosis Significant stenosis in L ICA to 80-99%. This is more narrow than previous neck imaging. Vascular surgery consulted, recommending outpatient f/u to consider CEA for asymptomatic high grade stenosis.  -f/u with vascular surgery outpatient -continue Atorvastatin 80 mg QD  Diet: regular IVF: LR 114ml/hr VTE: Lovenox Prior to Admission Living Arrangement: home Anticipated Discharge Location: home Barriers to Discharge: completion of neuro workup, hypokalemia Dispo: Anticipated discharge in approximately 2 day(s).   Andrew Au, MD 09/24/2019, 6:13 AM Pager: [phone] After 5pm on weekdays and 1pm on weekends: On Call pager [phone]

## 2019-09-25 DIAGNOSIS — D3501 Benign neoplasm of right adrenal gland: Secondary | ICD-10-CM | POA: Diagnosis present

## 2019-09-25 DIAGNOSIS — K551 Chronic vascular disorders of intestine: Secondary | ICD-10-CM | POA: Diagnosis present

## 2019-09-25 LAB — PROTEIN C ACTIVITY: Protein C Activity: 141 % (ref 73–180)

## 2019-09-25 LAB — GLUCOSE, CAPILLARY
Glucose-Capillary: 123 mg/dL — ABNORMAL HIGH (ref 70–99)
Glucose-Capillary: 177 mg/dL — ABNORMAL HIGH (ref 70–99)
Glucose-Capillary: 204 mg/dL — ABNORMAL HIGH (ref 70–99)

## 2019-09-25 LAB — CORTISOL-AM, BLOOD: Cortisol - AM: 10.7 ug/dL (ref 6.7–22.6)

## 2019-09-25 LAB — LUPUS ANTICOAGULANT PANEL
DRVVT: 40 s (ref 0.0–47.0)
PTT Lupus Anticoagulant: 35.7 s (ref 0.0–51.9)

## 2019-09-25 LAB — PROTEIN S, TOTAL: Protein S Ag, Total: 110 % (ref 60–150)

## 2019-09-25 LAB — BASIC METABOLIC PANEL
Anion gap: 7 (ref 5–15)
BUN: 17 mg/dL (ref 6–20)
CO2: 23 mmol/L (ref 22–32)
Calcium: 8.7 mg/dL — ABNORMAL LOW (ref 8.9–10.3)
Chloride: 108 mmol/L (ref 98–111)
Creatinine, Ser: 1.43 mg/dL — ABNORMAL HIGH (ref 0.44–1.00)
GFR calc Af Amer: 49 mL/min — ABNORMAL LOW (ref >60)
GFR calc non Af Amer: 42 mL/min — ABNORMAL LOW (ref >60)
Glucose, Bld: 126 mg/dL — ABNORMAL HIGH (ref 70–99)
Potassium: 3.5 mmol/L (ref 3.5–5.1)
Sodium: 138 mmol/L (ref 135–145)

## 2019-09-25 LAB — CARDIOLIPIN ANTIBODIES, IGG, IGM, IGA
Anticardiolipin IgA: <9 APL U/mL (ref 0–11)
Anticardiolipin IgG: <9 GPL U/mL (ref 0–14)
Anticardiolipin IgM: <9 MPL U/mL (ref 0–12)

## 2019-09-25 LAB — BETA-2-GLYCOPROTEIN I ABS, IGG/M/A
Beta-2 Glyco I IgG: <9 GPI IgG units (ref 0–20)
Beta-2-Glycoprotein I IgA: <9 GPI IgA units (ref 0–25)
Beta-2-Glycoprotein I IgM: <9 GPI IgM units (ref 0–32)

## 2019-09-25 LAB — CBC
HCT: 36.3 % (ref 36.0–46.0)
Hemoglobin: 11.6 g/dL — ABNORMAL LOW (ref 12.0–15.0)
MCH: 32 pg (ref 26.0–34.0)
MCHC: 32 g/dL (ref 30.0–36.0)
MCV: 100 fL (ref 80.0–100.0)
Platelets: 287 10*3/uL (ref 150–400)
RBC: 3.63 MIL/uL — ABNORMAL LOW (ref 3.87–5.11)
RDW: 13.1 % (ref 11.5–15.5)
WBC: 7.6 10*3/uL (ref 4.0–10.5)
nRBC: 0 % (ref 0.0–0.2)

## 2019-09-25 LAB — PROTEIN S ACTIVITY: Protein S Activity: 118 % (ref 63–140)

## 2019-09-25 MED ORDER — ASPIRIN 81 MG PO CHEW: 81.0000 mg | CHEWABLE_TABLET | Freq: Every day | ORAL | 0 refills | Status: AC

## 2019-09-25 MED ORDER — TICAGRELOR 90 MG PO TABS: 90.0000 mg | ORAL_TABLET | Freq: Two times a day (BID) | ORAL | Status: DC

## 2019-09-25 MED ORDER — TICAGRELOR 90 MG PO TABS: 90.0000 mg | ORAL_TABLET | Freq: Two times a day (BID) | ORAL | 0 refills | Status: AC

## 2019-09-25 MED ORDER — TICAGRELOR 90 MG PO TABS: 180.0000 mg | ORAL_TABLET | Freq: Once | ORAL | 0 refills | Status: AC

## 2019-09-25 MED ORDER — TICAGRELOR 90 MG PO TABS
180.0000 mg | ORAL_TABLET | Freq: Once | ORAL | Status: AC
  Administered 2019-09-25: 180 mg via ORAL
  Filled 2019-09-25: qty 2

## 2019-09-25 NOTE — Progress Notes (Signed)
54 year old female that vascular surgery was consulted for high-grade left ICA stenosis on duplex after admission for generalized weakness and chronic diarrhea.  As previously noted, I do not think her carotid disease is symptomatic at this timeand would not recommend urgent surgical intervention.Her MRI shows bilateral hemispheric infarcts most pronounced posteriorly and she had no focal neurologic symptoms on presentation. Presentation more suggestive of cardioembolic or hypoperfusion and neurology agrees.  Will arrange follow-up in 1 month for evaluation of elective carotid revascularization.  Discussed I think she needs to be optimized medically from her chronic diarrhea and likely volume status issues prior to elective carotid revascularization.  She has previously been seen by Dr. Donzetta Matters and will arrange follow-up with him in 1 month.  I will sign off for now.  Please call us back with further questions or concerns.  Marty Heck, MD [facility] [place] Office: [place]

## 2019-09-25 NOTE — TOC Progression Note (Signed)
Transition of Care [facility]) - Progression Note    Patient Details  Name: Pamela Carney MRN: [id number] Date of Birth: November 28, 1965  Transition of Care [facility]) CM/SW [place], RN Phone Number: [phone]  09/25/2019, 1:02 PM  Clinical Narrative:    Home health PT has been set up with [facility]. Information has been added to the AVS.      Barriers to Discharge: Continued Medical Work up  Expected Discharge Plan and Services                                     HH Arranged: PT [place]: [place] Date [place]: 09/25/19 Time [place]: [place] Representative spoke with at [place]: [place] ([place]) Interventions    Readmission Risk Interventions No flowsheet data found.

## 2019-09-25 NOTE — TOC Transition Note (Signed)
Transition of Care [facility]) - CM/SW Discharge Note   Patient Details  Name: Pamela Carney MRN: [id number] Date of Birth: 1965-07-13  Transition of Care [facility]) CM/SW Contact:  Angelita Ingles, RN Phone Number: [phone]  09/25/2019, 4:09 PM   Clinical Narrative:    Pamela Carney 30 day supply free card given to patient . CM unable to verify copay due to pharmacy closed today. Bedside nurse to give last dose for today prior to discharge so the next dose is not due before tomorrow when patient can get to the pharmacy. Patient has been instructed on use of the card for 30 day supply and to follow up with PCP if she can not afford the medication. Patient verbalizes understanding. CM will sign off.    Final next level of care: [place] Barriers to Discharge: No Barriers Identified   Patient Goals and CMS Choice Patient states their goals for this hospitalization and ongoing recovery are:: To go home CMS Medicare.gov Compare Post Acute Care list provided to:: Patient Choice offered to / list presented to : Patient  Discharge Placement                       Discharge Plan and Services                DME Arranged: N/A DME Agency: NA       HH Arranged: PT HH Agency: [place] Date [place]: 09/25/19 Time [place]: 5003 Representative spoke with at [place]: [place] ([place]) Interventions     Readmission Risk Interventions No flowsheet data found.

## 2019-09-25 NOTE — Progress Notes (Signed)
Initial Nutrition Assessment  DOCUMENTATION CODES:   Obesity unspecified  INTERVENTION:   - Glucerna Shake po TID, each supplement provides 220 kcal and 10 grams of protein  - Pro-stat 30 ml po BID, each supplement provides 100 kcal and 15 grams of protein  - Encourage adequate PO intake  NUTRITION DIAGNOSIS:   Increased nutrient needs related to chronic illness as evidenced by estimated needs.  GOAL:   Patient will meet greater than or equal to 90% of their needs  MONITOR:   PO intake, Supplement acceptance, Labs, Weight trends, I & O's  REASON FOR ASSESSMENT:   Malnutrition Screening Tool    ASSESSMENT:   54 year old female who presented on 7/03 with dizziness and several months of diarrhea. PMH of severe intracranial and extracranial atherosclerotic disease, CVA, T2DM, HTN, HLD, CKD stage III. MRI revealed strokes.   Spoke with pt at bedside. RN in room providing nursing care.  Pt reports that over the last year she has been losing weight. Pt believes that she has lost about 70 lbs over the last year. She is unsure what her UBW is and/or what her weight was prior to weight loss.  Pt reports that she has not changed her eating at all over the last year. She reports eating 1-2 meals daily. Pt states that she tends to get nauseous and dehydrated from her chronic diarrhea. Pt reports that she was told that she doesn't "absorb food" well and that is why she was losing weight and having diarrhea. Pt unable to provide more information about this.  Pt reports that for her meals, she typically eats a frozen dinner or orders fast food or a salad from a restaurant. Pt reports that she does not cook like she used to. Pt states that she used to drink soda but now drinks sweet tea and lemonade.  Reviewed weight history in chart. Pt's weight has fluctuated between 90-101 kg over the last 9 months. There is no real downward trend in her weights. Current weight is 95 kg. Will continue to  monitor weights during admission.  Pt willing to try oral nutrition supplements during admission. Pt states she will see if they cause more diarrhea and if so will stop drinking them. Will trial Glucerna and Pro-stat.  Medications reviewed and include: SSI, Lantus 7 units daily at bedtime, Klor-con 40 mEq BID IVF: LR @ 125 ml/hr  Labs reviewed: cholesterol 224, TG 468, creatinine 1.43 CBG's: 123-220 x 24 hours  UOP: 1250 ml x 24 hours  NUTRITION - FOCUSED PHYSICAL EXAM:    Most Recent Value  Orbital Region No depletion  Upper Arm Region Mild depletion  Thoracic and Lumbar Region No depletion  Buccal Region No depletion  Temple Region No depletion  Clavicle Bone Region Mild depletion  Clavicle and Acromion Bone Region Mild depletion  Scapular Bone Region No depletion  Dorsal Hand No depletion  Patellar Region Mild depletion  Anterior Thigh Region Mild depletion  Posterior Calf Region Mild depletion  Edema (RD Assessment) None  Hair Reviewed  Eyes Reviewed  Mouth Reviewed  Skin Reviewed  Nails Reviewed       Diet Order:   Diet Order            Diet regular Room service appropriate? Yes; Fluid consistency: Thin  Diet effective now                 EDUCATION NEEDS:   Education needs have been addressed  Skin:  Skin Assessment: Reviewed RN  Assessment  Last BM:  09/24/19 type 6  Height:   Ht Readings from Last 1 Encounters:  09/24/19 5\' 2"  (1.575 m)    Weight:   Wt Readings from Last 1 Encounters:  09/24/19 95.9 kg    Ideal Body Weight:  50 kg  BMI:  Body mass index is 38.67 kg/m.  Estimated Nutritional Needs:   Kcal:  1800-2000  Protein:  85-100 grams  Fluid:  1.8-2.0 L    Gaynell Face, MS, RD, LDN Inpatient Clinical Dietitian Please see AMiON for contact information.

## 2019-09-25 NOTE — Progress Notes (Signed)
STROKE TEAM PROGRESS NOTE   INTERVAL HISTORY Patient is sitting up in bed. She is feeling fine and has no complaints. Neurological exam unchanged. Vital signs stable.  OBJECTIVE Vitals:   09/24/19 0816 09/24/19 1702 09/24/19 1708 09/25/19 0724  BP: (!) 113/57 (!) 85/62 (!) 89/51 121/80  Pulse:  74 74 63  Resp: 18 19  14   Temp: 97.8 F (36.6 C) 98.7 F (37.1 C)  98.6 F (37 C)  TempSrc: Oral     SpO2: 96% 95% 95% 92%  Weight:      Height:       CBC:  Recent Labs  Lab 09/23/19 0055 09/23/19 0117 09/24/19 0615 09/25/19 0447  WBC 9.6   < > 7.5 7.6  NEUTROABS 6.4  --   --   --   HGB 14.3   < > 12.0 11.6*  HCT 45.6   < > 38.5 36.3  MCV 98.5   < > 101.3* 100.0  PLT 354   < > 283 287   < > = values in this interval not displayed.    Basic Metabolic Panel:  Recent Labs  Lab 09/23/19 0927 09/23/19 1750 09/24/19 0615 09/25/19 0447  NA  --    < > 139 138  K  --    < > 3.0* 3.5  CL  --    < > 106 108  CO2  --    < > 23 23  GLUCOSE  --    < > 138* 126*  BUN  --    < > 14 17  CREATININE  --    < > 1.17* 1.43*  CALCIUM  --    < > 9.2 8.7*  MG 2.0  --   --   --   PHOS 3.0  --   --   --    < > = values in this interval not displayed.    Lipid Panel:     Component Value Date/Time   CHOL 224 (H) 09/24/2019 0630   TRIG 468 (H) 09/24/2019 0630   HDL 28 (L) 09/24/2019 0630   CHOLHDL 8.0 09/24/2019 0630   VLDL UNABLE TO CALCULATE IF TRIGLYCERIDE OVER 400 mg/dL 09/24/2019 0630   LDLCALC UNABLE TO CALCULATE IF TRIGLYCERIDE OVER 400 mg/dL 09/24/2019 0630   HgbA1c:  Lab Results  Component Value Date   HGBA1C 7.5 (H) 09/23/2019   Urine Drug Screen:     Component Value Date/Time   LABOPIA NONE DETECTED 04/27/2019 1843   COCAINSCRNUR NONE DETECTED 04/27/2019 1843   LABBENZ NONE DETECTED 04/27/2019 1843   AMPHETMU NONE DETECTED 04/27/2019 1843   THCU NONE DETECTED 04/27/2019 1843   LABBARB NONE DETECTED 04/27/2019 1843    Alcohol Level No results found for:  ETH  IMAGING CT ABDOMEN PELVIS W CONTRAST  Result Date: 09/24/2019 CLINICAL DATA:  Right lower quadrant pain, several months of diarrhea EXAM: CT ABDOMEN AND PELVIS WITH CONTRAST TECHNIQUE: Multidetector CT imaging of the abdomen and pelvis was performed using the standard protocol following bolus administration of intravenous contrast. CONTRAST:  133mL OMNIPAQUE IOHEXOL 300 MG/ML  SOLN COMPARISON:  CT 12/15/2018 FINDINGS: Lower chest: Bandlike areas of scarring and/or atelectasis in the otherwise clear lung bases. Normal heart size. No pericardial effusion. Coronary artery calcifications are present. Few calcifications of the aortic leaflets as well. Hepatobiliary: No worrisome focal liver abnormality is seen. Normal gallbladder. No visible calcified gallstones. No biliary ductal dilatation. Pancreas: Unremarkable. No pancreatic ductal dilatation or surrounding inflammatory changes. Spleen: Normal  in size without focal abnormality. Adrenals/Urinary Tract: Stable size of a 3.1 cm right adrenal nodule measuring less than 10 Hounsfield units on comparison noncontrast CT exams and most compatible with a benign adenoma. No other concerning adrenal nodules. Kidneys enhance and excrete symmetrically. Slightly asymmetric right renal atrophy with some upper pole scarring. Additional scarring in the lower pole left kidney. No obstructing urolithiasis or hydronephrosis. Few scattered nonobstructive collecting system calcifications, largest in the interpolar and lower pole left kidney, are similar to the comparison CT. No worrisome renal lesions. Scattered subcentimeter hypertension foci in both kidneys, left more numerous than right, too small to fully characterize on CT imaging but statistically likely benign. The urinary bladder is largely decompressed at the time of exam and therefore poorly evaluated by CT imaging. No gross bladder abnormality. Stomach/Bowel: Distal esophagus, stomach and duodenal sweep are  unremarkable. No small bowel wall thickening or dilatation. No evidence of obstruction. A normal appendix is visualized. No colonic dilatation or frank wall thickening though much of the colon is decompressed distally. Scattered colonic diverticula without focal inflammation to suggest diverticulitis. Vascular/Lymphatic: There is extensive calcified noncalcified atheromatous plaque throughout the abdominal aorta and branch vessels. Suspected mild-to-moderate narrowing at the celiac axis with more moderate to high-grade stenosis at the ostia of the SMA and IMA. Vessels are opacified distally possibly augmented by collateral flow. Additional renal artery stenoses are likely present as well. Additional narrowing is suspected at the iliac artery origins as well. These findings are incompletely assessed on this non angiographic technique. Mild fusiform ectasia of the right common iliac artery could reflect some poststenotic dilatation measuring up to 1.2 cm with a more proximal vessel caliber 0.8 cm. No suspicious or enlarged lymph nodes in the included lymphatic chains. Reproductive: Anteverted uterus.  No concerning adnexal lesions. Other: No abdominopelvic free fluid or free gas. No bowel containing hernias. Small fat containing umbilical hernia. Small focus of gas in the right anterior abdominal subcutaneous tissues possibly related to injectable use (3/66). Multiple chronic soft tissue calcifications noted in the left flank as well. Musculoskeletal: Slight asymmetric atrophy of the medial right and inferior left rectus sheath, possibly postsurgical in nature. Mild anterior abdominal wall laxity. Slight pelvic floor laxity noted as well. Multilevel degenerative changes are present in the imaged portions of the spine, hips and pelvis. No acute osseous abnormality or suspicious osseous lesion. IMPRESSION: 1. Extensive Aortic Atherosclerosis (ICD10-I70.0). Multilevel plaque narrowing throughout the splanchnic and renal  arteries, incompletely assessed on this non angiographic technique, but with likely moderate to severe narrowing of the SMA and IMA origin. Findings could be seen in the setting of a chronic mesenteric ischemic process. 2. No other acute or suggestive features to provide cause for patient's symptoms. 3. Stable right adrenal adenoma. 4. Bilateral renal scarring, left greater than right. Nonobstructing left nephrolithiasis. 5. Aortic Atherosclerosis (ICD10-I70.0). Electronically Signed   By: Lovena Le M.D.   On: 09/24/2019 22:02     PHYSICAL EXAM Pleasant middle-aged Caucasian lady not in distress. . Afebrile. Head is nontraumatic. Neck is supple without bruit.    Cardiac exam no murmur or gallop. Lungs are clear to auscultation. Distal pulses are well felt. Neurological Exam ;  Awake  Alert oriented x 3. Normal speech and language.eye movements full without nystagmus.fundi were not visualized. Vision acuity  appears normal. But visual fields show right homonymous hemianopsia. Hearing is normal. Palatal movements are normal. Face asymmetric with right lower facial weakness.. Tongue midline. Normal strength, tone, reflexes and coordination.  Diminished fine finger movements on the right. Orbits left over right upper extremity. Trace right grip weakness. Normal sensation. Gait deferred.   ASSESSMENT/PLAN Pamela Carney is a 54 y.o. female with history of severe intracranial and extracranial atherosclerotic disease, bipolar disorder, CVA, tobacco use, DM, HTN and HLD who presented to the [facility] ED with a c/c of dizziness - MRI revealed strokes. She did not receive IV t-PA due to late presentation (>4.5 hours from time of onset).  Stroke- multifocal  Code Stroke CT Head -      CT head -  MRI head -multifocal patchy small volume acute ischemic nonhemorrhagic infarcts involving the cortical and subcortical cerebral hemispheres bilaterally, most pronounced posteriorly. Several of these foci are  somewhat watershed in distribution, and could reflect a transient hypoperfusion injury MRA head - Motion degraded exam despite repeated imaging attempts. Negative for large vessel occlusion  CTA H&N -not done  CT Perfusion -not done  Carotid Doppler L ICA 80-99% stenosis   LE doppler no DVT  2D Echo w/ bubble ejection fraction 60 to 65%.  [employer] Virus 2 - negative  Repeat TEE not necessary from our standpoint     LDL unable to calculate due to elevated triglycerides > 400  HgbA1c - 7.5  VTE prophylaxis - Lovenox  No antithrombotic prior to admission, now on aspirin 81 mg daily. Recommend aspirin and Brilinta 90 bid x 30 days then aspirin alone  Therapy recommendations:  No OT  Disposition:  Pending  Carotid Stenosis  Carotid Doppler L ICA 80-99% stenosis   VVS consulted. Does not feel lesion symptomatic -plan for elective revascularization in a month  Hypertension  Home BP meds: Lozol ; Potassium ; Topamax  Current BP meds: none   Stable . Permissive hypertension (OK if < 220/120) but gradually normalize in 5-7 days  . Long-term BP goal normotensive  Hyperlipidemia  Home Lipid lowering medication: Lipitor 80 mg daily  LDL unable to calculate due to elevated triglycerides > 400, goal < 70  Current lipid lowering medication: Lipitor 80 mg daily   Continue statin at discharge  Diabetes type II, uncontrolled  HgbA1c 7.5, goal < 7.0  Other Stroke Risk Factors  Cigarette smoker - advised to stop smoking  Obesity, Body mass index is 38.67 kg/m., recommend weight loss, diet and exercise as appropriate   Family hx stroke (grandmother)   Hx stroke -    OSA - uses cpap x for the past 1 month, no specified reason  Other Active Problems CKD - stage 3a  Hypokalemia -     Hospital day # 1  Patient presented with generalized weakness and dizziness in the setting of chronic diarrhea and hypotension. She does have critical extracranial left carotid  stenosis and some of her infarcts on the left and may be symptomatic but because she has by cerebral infarcts a cardiac source of embolism or atrial fibrillation is more likely. Patient already has a loop recorder which was last interrogated on 09/18/2019 and was negative for A. fib. She also had a TEE during her previous admission for stroke hence I do not think repeat TEE is beneficial .patient was on aspirin and Plavix and failed it hence recommend changing to aspirin and Brilinta for 30 days followed by aspirin.( THALES trial ) Elective left carotid revascularization after a month by Dr. Donzetta Matters from vascular surgery. Long discussion with patient and answered questions. Greater than 50% time during this 35-minute visit was spent on counseling and coordination  of care about her multiple embolic strokes and carotid stenosis and answering questions Antony Contras, MD.  To contact Stroke Continuity provider, please refer to [email or web address] After hours, contact General Neurology

## 2019-09-25 NOTE — Progress Notes (Signed)
Physical Therapy Treatment Patient Details Name: Pamela Carney MRN: [id number] DOB: 06/03/1965 Today's Date: 09/25/2019    History of Present Illness 54 y.o. female s/p dizziness, weakness, diarrhea. MRI: b/l posterior cortical and subcortical multifocal ischemic infarcts; remote L PCA and L basal ganglia infarcts. PMHx diabetes, chronic kidney disease, hypertension, hyperlipidemia that vascular surgery has been consulted for high-grade left carotid stenosis with non emergent need for surgery at this time, cryptogenic stroke in 11/2018 with MRI demonstrating L medial occipital lobe infarct.    PT Comments    Patient plans to d/c home this pm.  Did remind her needs to use RW at home and plan for follow up HHPT.  She remains weak at times, still with diarrhea during this session.  Family intermittently able to assist.  Will need HHPT.   Follow Up Recommendations  Home health PT     Equipment Recommendations  None recommended by PT    Recommendations for Other Services       Precautions / Restrictions Precautions Precautions: Fall Precaution Comments: Right visual field cut and "spot in vision" Restrictions Weight Bearing Restrictions: No    Mobility  Bed Mobility Overal bed mobility: Modified Independent             General bed mobility comments: up in chair  Transfers Overall transfer level: Needs assistance Equipment used: None;Rolling walker (2 wheeled) Transfers: Sit to/from [employer] Transfers Sit to Stand: Supervision Stand pivot transfers: Supervision       General transfer comment: up from recliner to [facility] with close S with BSC right beside recliner, up to walk to sink with RW  Ambulation/Gait Ambulation/Gait assistance: Supervision Gait Distance (Feet): 20 Feet Assistive device: Rolling walker (2 wheeled) Gait Pattern/deviations: Step-through pattern;Step-to pattern;Wide base of support     General Gait Details: S for safety around obstacles in  the room with RW to sink to wash hands; declined to practice steps as wants to "safe her strength to go home"   Stairs             Wheelchair Mobility    Modified Rankin (Stroke Patients Only) Modified Rankin (Stroke Patients Only) Pre-Morbid Rankin Score: Moderate disability Modified Rankin: Moderately severe disability     Balance Overall balance assessment: Needs assistance Sitting-balance support: No upper extremity supported Sitting balance-Leahy Scale: Good     Standing balance support: Bilateral upper extremity supported;No upper extremity supported;During functional activity Standing balance-Leahy Scale: Fair Standing balance comment: can stand without UE support, but needs RW for ambulation                            Cognition Arousal/Alertness: Awake/alert Behavior During Therapy: WFL for tasks assessed/performed Overall Cognitive Status: Impaired/Different from baseline Area of Impairment: Memory;Problem solving;Attention                   Current Attention Level: Selective Memory: Decreased short-term memory     Awareness: Emergent Problem Solving: Requires verbal cues;Slow processing General Comments: Slight tangiental conversation. Increased time and cues. Pt reporting episodes of ST memory deficits.       Exercises      General Comments General comments (skin integrity, edema, etc.): BP sitting at EOB 118/74, standing 103/79, sitting at sink 107/86, sitting in recliner at end of session 93/70      Pertinent Vitals/Pain Pain Assessment: Faces Faces Pain Scale: Hurts little more Pain Location: rectum Pain Descriptors / Indicators: Sore Pain Intervention(s):  Monitored during session;Repositioned;Other (comment) (applied skin protectent)    Home Living                      Prior Function            PT Goals (current goals can now be found in the care plan section) Acute Rehab PT Goals Patient Stated Goal: to  return to independent Progress towards PT goals: Progressing toward goals    Frequency    Min 4X/week      PT Plan Current plan remains appropriate    Co-evaluation              AM-PAC PT "6 Clicks" Mobility   Outcome Measure  Help needed turning from your back to your side while in a flat bed without using bedrails?: None Help needed moving from lying on your back to sitting on the side of a flat bed without using bedrails?: None Help needed moving to and from a bed to a chair (including a wheelchair)?: None Help needed standing up from a chair using your arms (e.g., wheelchair or bedside chair)?: None Help needed to walk in hospital room?: A Little Help needed climbing 3-5 steps with a railing? : A Little 6 Click Score: 22    End of Session   Activity Tolerance: Patient tolerated treatment well Patient left: in chair;with call bell/phone within reach;with nursing/sitter in room   PT Visit Diagnosis: Other abnormalities of gait and mobility (R26.89);Muscle weakness (generalized) (M62.81)     Time: 3785-8850 PT Time Calculation (min) (ACUTE ONLY): 19 min  Charges:  $Gait Training: 8-22 mins                     Magda Kiel, PT Acute Rehabilitation Services Pager:[phone] Office:[phone] 09/25/2019    Pamela Carney 09/25/2019, 4:14 PM

## 2019-09-25 NOTE — Progress Notes (Addendum)
Subjective:  Patient is examined at bedside today. Patient reports improvement of dizziness. Patient states that diarrhea persists and she had about 3 episodes of diarrheas in the past 24h. Denies CP, SOB, or new neuro Sx. Patient is living at home with her husband and children. Her 53 y/o son can assist with ADLs.  Objective:  Vital signs in last 24 hours: Vitals:   09/24/19 0400 09/24/19 0816 09/24/19 1702 09/24/19 1708  BP:  (!) 113/57 (!) 85/62 (!) 89/51  Pulse:   74 74  Resp: 18 18 19    Temp:  97.8 F (36.6 C) 98.7 F (37.1 C)   TempSrc:  Oral    SpO2:  96% 95% 95%  Weight:      Height:        Physical Exam Constitutional: no acute distress Head: atraumatic ENT: external ears normal Eyes: EOMI, L pupil 3.59mm and R 63mm, both round and reactive to light Cardiovascular: regular rate and rhythm, normal heart sounds Pulmonary: effort normal, lungs clear to ascultation bilaterally Abdominal: flat, nontender, no rebound tenderness, bowel sounds normal Skin: warm and dry Neurological: alert, CN 2-12 intact besides anisocoria noted above which has been present since admission, no dysmetria, no dysdiachokinesia, sensation intact throughout except in R hand where patient notes tingling from chronic neuropathy, RUE slightly weaker than L, otherwise 4/5 strength throughout Psychiatric: normal mood and affect  Assessment/Plan: Pamela Carney is a 54 y.o. female with hx of HTN, HLD, DM, CKD stage III, smoking, prior cryptogenic stroke in September 2020, OSA, migraines presenting with 3 days of dizziness, also found to be volume deplete and hypokalemic to 2.7. On MRI, was found to have multifocal ischemic stroke. She has also had chronic diarrhea, and indapamide use may be compounding her hypokalemia.  Principal Problem:   Cerebrovascular accident (CVA) ([place]) Active Problems:   Dizziness   Hypovolemia   Diarrhea   Chronic mesenteric ischemia (HCC)   Adenoma of right adrenal  gland  Stroke, multifocal Etiology most likely embolic. Hypoperfusion and high grade L ICA stenosis also considered, but do not explain her stroke distribution. MRI with multifocal ischemic infarcts involving cortical and subcortical cerebral hemispheres bilaterally, most pronounced posteriorly. Also remote left PCA and left basal ganglia infarcts. Possibly contributing are chronic diarrhea and thiazide use leading to hypotension. Orthostatics negative. A1c 7.5. Lower extremity doppler negative. Echo with normal EF and no PFO. Vascular consulted for L ICA 80-99% stenosis, they deemed noncontributory to current presentation and will follow up outpatient for elective CEA. Elevated lipids, described below. -TEE and loop per neuro, appreciate recs -other labs to f/u per neuro recs: homocysteine, ANA, Cardiolipin, prothrombin gene mutation, factor 5 leiden, beta-2-glycoprotein, lupus anticoagulant, protein S, protein C -negative sed rate, CRP, TSH, Anithrombin 3 -PT recommending home PT, OT and speech without further needs -telemetry monitoring -counseled on smoking cessation    Chronic diarrhea, chronic mesenteric ischemia Reports diarrhea 4 times daily since April, associated with weight loss and presented with K 2.7. States that her stools are greasy. Declined stool studies by PCP as she did not mind the diarrhea. Has had ongoing diarrhea since admission, comparable to her baseline. Colonoscopy on February to evaluate constipation without significant findings. GI panel negative. HIV negative.  CT abdomen with extensive atherosclerosis including splanchnic, renal, SMA, and IMA arteries consistent with chronic mesenteric ischemia -f/u pancreatic elastase -f/u fecal lactoferrin -f/u fecal ova and parasite -f/u with GI outpatient -can discuss with vascular surgery at follow up if she would  be a candidate for intervention for mesenteric ischemia  Hypokalemia Potassium 2.7 on presentation to ED.  Improving with supplementation, 3.5 today. No arrhythmias. Likely secondary to chronic diarrhea and indapamide use.  Repleting as necessary. -Replete Klor-con 40mg  PO BID -holding indipamide   Hypotension Presented with BP in 90-240 systolic, improved to 973-532 here with fluids, midodrine, and holding indipamide. Orthostatics negative. Several month history of diarrhea and thiazide usage.  -continue LR infusion 100 ml/hr -discontinue Midodrine  -holding Indapimide   Diabetes Mellitus A1c at 7.5 and glucose at 126 today. Recently taken off metformin by PCP and has increased her home insulin usage to 21 units per day at night time to accommodate. -continue Insulin Aspart sliding scale, 11 units received yesterday -continue Insulin Glargine 7 units nightly   Hyperlipidemia Fasting lipid panel with TG 468, cholesterol 224, HDL 28, LDL 112.  -continue Atorvastatin 80 mg QD   Carotid Artery Stenosis Significant stenosis in L ICA to 80-99%. This is more narrow than previous neck imaging. Vascular surgery consulted, recommending outpatient f/u to consider CEA for asymptomatic high grade stenosis.  -f/u with vascular surgery outpatient -continue Atorvastatin 80 mg QD  Recurrent nephrolithiasis Reports urologist started her on indipamide for chronic nephrolithiasis. Non-obstructing stone noted on CT abdomen. Indipamide stopped for hypotension and hypokalemia. -f/u with urologist outpatient   Diet: regular IVF: LR 152ml/hr VTE: Lovenox Prior to Admission Living Arrangement: home Anticipated Discharge Location: home Barriers to Discharge: completion of neuro workup, hypokalemia Dispo: Anticipated discharge in approximately 1-2 day(s).    Andrew Au, MD 09/25/2019, 6:53 AM Pager: [phone] After 5pm on weekdays and 1pm on weekends: On Call pager [phone]

## 2019-09-25 NOTE — Progress Notes (Signed)
Occupational Therapy Treatment Patient Details Name: Pamela Carney MRN: [id number] DOB: 1965-12-16 Today's Date: 09/25/2019    History of present illness 54 y.o. female s/p dizziness, weakness, diarrhea. MRI: b/l posterior cortical and subcortical multifocal ischemic infarcts; remote L PCA and L basal ganglia infarcts. PMHx diabetes, chronic kidney disease, hypertension, hyperlipidemia that vascular surgery has been consulted for high-grade left carotid stenosis with non emergent need for surgery at this time, cryptogenic stroke in 11/2018 with MRI demonstrating L medial occipital lobe infarct.   OT comments  Pt progressing towards established OT goals. Pt presenting with vision deficits, decreased cognition, and poor activity tolerance with dizziness and drops in BP. Pt reporting that she is having new "spots in vision" and "blurry vision" in addition to her right side field cut from prior CVA. Pt with difficulty reading, locating ADL items, and navigating room. Pt also with decreased ST memory, problem solving, and awareness requiring increased cues and time throughout session. Pt reporting she wasn't sure if she had a CVA this admission and that she was forgetting information. Pt with decreased FM skills and sensation at fingertips as seen by dropping ADL items and poor in-hand manipulation. Update dc recommendation to [facility] and will continue to follow acutely as admitted.    Follow Up Recommendations  Home health OT;Supervision - Intermittent    Equipment Recommendations  None recommended by OT    Recommendations for Other Services      Precautions / Restrictions Precautions Precautions: Fall Precaution Comments: Right visual field cut and "spot in vision" Restrictions Weight Bearing Restrictions: No       Mobility Bed Mobility Overal bed mobility: Modified Independent             General bed mobility comments: Increased time  Transfers Overall transfer level: Needs  assistance Equipment used: Rolling walker (2 wheeled) Transfers: Sit to/from [facility] Sit to Stand: Min guard Stand pivot transfers: Min guard       General transfer comment: Min Guard A for safety    Balance Overall balance assessment: Needs assistance Sitting-balance support: No upper extremity supported;Feet supported Sitting balance-Leahy Scale: Good     Standing balance support: Bilateral upper extremity supported;No upper extremity supported Standing balance-Leahy Scale: Poor Standing balance comment: UE support for balance at sink for light grooming                           ADL either performed or assessed with clinical judgement   ADL Overall ADL's : Needs assistance/impaired     Grooming: Wash/dry hands;Oral care;Set up;Supervision/safety;Sitting Grooming Details (indicate cue type and reason): Supervision for safety. Dizziness in standing, so sitting at sink                 Toilet Transfer: Min guard (simulated to chair) Toilet Transfer Details (indicate cue type and reason): Min Guard A for safety         Functional mobility during ADLs: Min guard;Rolling walker;Cueing for safety General ADL Comments: Pt with dizziness with mobility. Limiting functional performance. BP stable. Provided EC handout and will need to review in full.     Vision   Vision Assessment?: Yes Eye Alignment: Within Functional Limits Ocular Range of Motion: Within Functional Limits Alignment/Gaze Preference: Within Defined Limits Tracking/Visual Pursuits: Able to track stimulus in all quads without difficulty Additional Comments: Pt reporting she is "seeing spot". Baseline R field cut since last stroke. When reading, pt abel to read but  reports that her vision is fuzzy even with her glasses.   Perception     Praxis      Cognition Arousal/Alertness: Awake/alert Behavior During Therapy: WFL for tasks assessed/performed Overall Cognitive Status:  Impaired/Different from baseline Area of Impairment: Problem solving;Memory;Awareness                     Memory: Decreased short-term memory     Awareness: Emergent Problem Solving: Requires verbal cues;Slow processing General Comments: Slight tangiental conversation. Increased time and cues. Pt reporting episodes of ST memory deficits.         Exercises     Shoulder Instructions       General Comments BP sitting at EOB 118/74, standing 103/79, sitting at sink 107/86, sitting in recliner at end of session 93/70    Pertinent Vitals/ Pain       Pain Assessment: No/denies pain  Home Living                                          Prior Functioning/Environment              Frequency  Min 2X/week        Progress Toward Goals  OT Goals(current goals can now be found in the care plan section)  Progress towards OT goals: Progressing toward goals  Acute Rehab OT Goals Patient Stated Goal: to return to independent OT Goal Formulation: With patient Time For Goal Achievement: 10/08/19 Potential to Achieve Goals: Good ADL Goals Additional ADL Goal #1: pt will increase to modified independence for OOB ADL tasks to increase independence. Additional ADL Goal #2: Pt will utilize/recall 3 energy conservation strategies to increase independence.  Plan Discharge plan remains appropriate    Co-evaluation                 AM-PAC OT "6 Clicks" Daily Activity     Outcome Measure   Help from another person eating meals?: None Help from another person taking care of personal grooming?: A Little Help from another person toileting, which includes using toliet, bedpan, or urinal?: A Little Help from another person bathing (including washing, rinsing, drying)?: A Little Help from another person to put on and taking off regular upper body clothing?: A Little Help from another person to put on and taking off regular lower body clothing?: A Little 6  Click Score: 19    End of Session Equipment Utilized During Treatment: Rolling walker  OT Visit Diagnosis: Unsteadiness on feet (R26.81);Muscle weakness (generalized) (M62.81)   Activity Tolerance Patient tolerated treatment well   Patient Left in chair;with call bell/phone within reach   Nurse Communication Mobility status        Time: 8182-9937 OT Time Calculation (min): 36 min  Charges: OT General Charges $OT Visit: 1 Visit OT Treatments $Self Care/Home Management : 23-37 mins  [place], OTR/L Acute Rehab Pager: [phone] Office: [place] 09/25/2019, 2:59 PM

## 2019-09-26 ENCOUNTER — Encounter (INDEPENDENT_AMBULATORY_CARE_PROVIDER_SITE_OTHER): Payer: 59 | Admitting: Podiatry

## 2019-09-26 LAB — ANA: Anti Nuclear Antibody (ANA): NEGATIVE

## 2019-09-26 LAB — LACTOFERRIN, FECAL, QUALITATIVE: Lactoferrin, Fecal, Qual: NEGATIVE

## 2019-09-26 NOTE — TOC Benefit Eligibility Note (Signed)
Transition of Care [facility]) Benefit Eligibility Note    Patient Details  Name: Pamela Carney MRN: [id number] Date of Birth: 1965/11/24                                Shelda Altes Phone Number: 09/26/2019, 9:42 AM

## 2019-09-26 NOTE — Care Management (Signed)
Per [employer]. W/ optium RX @[phone]   Co-pay for Brilinta 90 mg bid  $Zero Co-pay. No PA required No Deductible Tier 4  Any retail pharmacy

## 2019-09-26 NOTE — Progress Notes (Signed)
This encounter was created in error - please disregard.

## 2019-09-27 ENCOUNTER — Telehealth: Payer: Self-pay

## 2019-09-27 LAB — HOMOCYSTEINE: Homocysteine: 11 umol/L (ref 0.0–14.5)

## 2019-09-27 NOTE — Telephone Encounter (Signed)
Transition Care Management Follow-up Telephone Call  Date of discharge and from where: 09/25/2019, [facility]   How have you been since you were released from the hospital? She stated she is feeling " fine."  Any questions or concerns?She explained that she would like to speak with her PCP about her insulin and diabetic regime. She said that the metformin was stopped due to her diarrhea but she continued to have diarrhea after stopping the medication.  She needs to schedule GI follow up,   Items Reviewed:  Did the pt receive and understand the discharge instructions provided?yes  Medications obtained and verified?  she said she has all medications except the Brilinta. Her pharmacy didn't have it in stock yet and she needs to check to see if it is available today. She verbalized the importance of obtaining that brilinta.  She didn't have any questions about her med regime  or feel it was necessary to review the medication list.    Any new allergies since your discharge?  none reported   Do you have support at home?  she lives with her husband but she said he was taken to the ED today with chest pain.   Home health was ordered through [facility] but she said that she has not heard from them yet.  Has glucometer and said that her blood sugars have been " high."   Functional Questionnaire: (I = Independent and D = Dependent) ADLs: independent, has RW but thinks she could benefit from a cane.  Has not been seen by home PT yet  Follow up appointments reviewed:   PCP Hospital f/u appt confirmed?Geryl Rankins, NP - 10/04/2019  Specialist Hospital f/u appt confirmed?vascular - 11/03/2019, neurology - 12/28/2019  If their condition worsens, is the pt aware to call PCP or go to the Emergency Dept.? yes  Was the patient provided with contact information for the PCP's office or ED? she has the clinic phone number  Was to pt encouraged to call back with questions or concerns?  yes

## 2019-09-28 ENCOUNTER — Other Ambulatory Visit: Payer: Self-pay | Admitting: Nurse Practitioner

## 2019-09-28 DIAGNOSIS — K551 Chronic vascular disorders of intestine: Secondary | ICD-10-CM

## 2019-09-28 DIAGNOSIS — I700 Atherosclerosis of aorta: Secondary | ICD-10-CM

## 2019-09-28 LAB — FACTOR 5 LEIDEN

## 2019-09-28 NOTE — Telephone Encounter (Signed)
She has been referred to cardiology and gastroenterology. She has significant coronary artery disease likely from her poorly controlled chronic health conditions and Gi needs to see her for her Diarrhea. Let her know they will be calling to schedule

## 2019-09-29 LAB — PROTHROMBIN GENE MUTATION

## 2019-09-29 NOTE — Telephone Encounter (Signed)
Spoke to patient and informed on referrals. Pt. Understood and was informed she have a cardiologist appt.

## 2019-10-02 LAB — OVA + PARASITE EXAM

## 2019-10-02 LAB — O&P RESULT

## 2019-10-04 ENCOUNTER — Ambulatory Visit: Payer: 59 | Attending: Nurse Practitioner | Admitting: Nurse Practitioner

## 2019-10-04 ENCOUNTER — Encounter: Payer: Self-pay | Admitting: Nurse Practitioner

## 2019-10-04 ENCOUNTER — Other Ambulatory Visit: Payer: Self-pay

## 2019-10-04 DIAGNOSIS — K551 Chronic vascular disorders of intestine: Secondary | ICD-10-CM

## 2019-10-04 DIAGNOSIS — I6522 Occlusion and stenosis of left carotid artery: Secondary | ICD-10-CM

## 2019-10-04 DIAGNOSIS — Z09 Encounter for follow-up examination after completed treatment for conditions other than malignant neoplasm: Secondary | ICD-10-CM | POA: Diagnosis not present

## 2019-10-04 DIAGNOSIS — I700 Atherosclerosis of aorta: Secondary | ICD-10-CM

## 2019-10-04 DIAGNOSIS — N200 Calculus of kidney: Secondary | ICD-10-CM | POA: Diagnosis not present

## 2019-10-04 DIAGNOSIS — N1831 Chronic kidney disease, stage 3a: Secondary | ICD-10-CM

## 2019-10-04 NOTE — Progress Notes (Addendum)
Virtual Visit via Telephone Note Due to national recommendations of social distancing due to [place] 19, telehealth visit is felt to be most appropriate for this patient at this time.  I discussed the limitations, risks, security and privacy concerns of performing an evaluation and management service by telephone and the availability of in person appointments. I also discussed with the patient that there may be a patient responsible charge related to this service. The patient expressed understanding and agreed to proceed.    I connected with Rosemary Holms on 10/04/19  at  10:50 AM EDT  EDT by telephone and verified that I am speaking with the correct person using two identifiers.   Consent I discussed the limitations, risks, security and privacy concerns of performing an evaluation and management service by telephone and the availability of in person appointments. I also discussed with the patient that there may be a patient responsible charge related to this service. The patient expressed understanding and agreed to proceed.   Location of Patient: Private Residence   Location of Provider: [place] and [employer] Office    Persons participating in Telemedicine visit: Geryl Rankins FNP-BC [place]    History of Present Illness: Telemedicine visit for: HFU PMH significant for diabetes mellitus type 2, dyslipidemia, hypertension and cryptogenic stroke.  She was admitted to the hospital on 09/23/2019 through 09/25/2019 after being treated for multifocal stroke with likely embolic etiology, chronic mesenteric ischemia. Also noted for left ICA stenosis and CTA revealed extensive atherosclerosis involving the SMA and IMA. She has been referred to vascular surgery for this. Echo with bubble study revealed normal EF and no PFO. No A. fib was detected. She was started on aspirin, Brilinta and is to continue on atorvastatin. In regard to her chronic diarrhea GI panel was  negative. Her indapamide which she takes for chronic kidney stones was discontinued due to hypokalemia. She was instructed to follow-up with gastroenterology, urology and vascular.    Today she states she is doing well. Right foot has been swollen with no pain or erythema since she left the hospital 9 days ago. She has not tried to elevate the foot or wear her compression stockings. I recommended both. If swelling does not improve over the weekend she was instructed to call the office on Monday. She is able to bear weight and denies any symptoms of DVT.  Doppler study for BLE DVT in hospital was negative.   Currently taking 23 units of lantus with blood glucose levels around 140 fasting and postprandial. She was instructed to increase to 25 units. Needs tight A1c control. She is sedentary and not diet or exercise adherent. Continues to smoke cigarettes.  Lab Results  Component Value Date   HGBA1C 7.5 (H) 09/23/2019     Past Medical History:  Diagnosis Date  . Abnormal Pap smear   . AKI (acute kidney injury) ([place]) 12/28/2017  . Anxiety   . Arthritis    bil knees, neck  . Bipolar 1 disorder ([place])   . Cataract    Mild  . Cholelithiasis 12/23/2017   noted on CT renal, pt unaware  . Chronic kidney disease (CKD), stage III (moderate)   . Cystitis   . Depression   . Diabetes mellitus without complication ([place])    type 2  . Genital warts    Hx of genital  . GERD (gastroesophageal reflux disease)   . Heart murmur    childhood  . Hematuria   .  History of blood transfusion   . History of ectopic pregnancy   . History of gestational diabetes   . History of kidney stones   . History of sepsis    after ectopic pregnancy  . Hyperlipidemia   . Hypertension   . Idiopathic peripheral neuropathy    both feet  . Leg ulcer, left ([place])   . Low back pain   . Migraines   . Obesity   . Oral candidiasis   . Pneumonia   . PONV (postoperative nausea and vomiting)    prolonged sedation  .  Recurrent UTI   . 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 Sigmoid diverticulosis 12/23/2017   noted on CT renal, pt unaware  . Sleep apnea    uses cpap  . Stroke [facility])    Cryptogenic  . Ulcer of foot ([place])    Left  . Weakness     Past Surgical History:  Procedure Laterality Date  . BUBBLE STUDY  12/23/2018   Procedure: BUBBLE STUDY;  Surgeon: Lelon Perla, MD;  Location: [facility] ENDOSCOPY;  Service: Cardiovascular;;  . CERVICAL CONE BIOPSY    . CESAREAN SECTION     x3  . CYSTOSCOPY W/ URETERAL STENT PLACEMENT Right 12/15/2018   Procedure: Cystoscopy, right retrograde ureteropyelogram, fluoroscopic interpretation, right double-J stent placement (24 cm x 6 [place]);  Surgeon: Franchot Gallo, MD;  Location: [place];  Service: Urology;  Laterality: Right;  . CYSTOSCOPY WITH RETROGRADE PYELOGRAM, URETEROSCOPY AND STENT PLACEMENT Right 01/27/2019   Procedure: CYSTOSCOPY WITH RETROGRADE PYELOGRAM, URETEROSCOPY AND STENT PLACEMENT; BASKET STONE RETRIEVAL;  Surgeon: Alexis Frock, MD;  Location: [facility];  Service: Urology;  Laterality: Right;  75 MINS  . CYSTOSCOPY/URETEROSCOPY/HOLMIUM LASER/STENT PLACEMENT Right 02/04/2018   Procedure: CYSTOSCOPY/URETEROSCOPY/RETROGRADE PYELOGRAM/HOLMIUM LASER/STENT PLACEMENT;  Surgeon: Alexis Frock, MD;  Location: [facility];  Service: Urology;  Laterality: Right;  . CYSTOSCOPY/URETEROSCOPY/HOLMIUM LASER/STENT PLACEMENT Right 02/07/2018   Procedure: CYSTOSCOPY LEFT STENT EXCHANGE;  Surgeon: Alexis Frock, MD;  Location: [facility];  Service: Urology;  Laterality: Right;  . DILATION AND CURETTAGE OF UTERUS    . ECTOPIC PREGNANCY SURGERY    . IR NEPHROSTOMY PLACEMENT LEFT  12/25/2017  . LOOP RECORDER INSERTION N/A 12/23/2018   Procedure: LOOP RECORDER INSERTION;  Surgeon: Constance Haw, MD;  Location: [facility];  Service: Cardiovascular;  Laterality: N/A;  .  NEPHROLITHOTOMY Left 02/04/2018   Procedure: NEPHROLITHOTOMY PERCUTANEOUS;  Surgeon: Alexis Frock, MD;  Location: [facility];  Service: Urology;  Laterality: Left;  3 HRS  . NEPHROLITHOTOMY Left 02/07/2018   Procedure: SECOND LOOK NEPHROLITHOTOMY PERCUTANEOUS;  Surgeon: Alexis Frock, MD;  Location: [facility];  Service: Urology;  Laterality: Left;  2 HRS  . OVARIAN CYST REMOVAL    . TEE WITHOUT CARDIOVERSION N/A 12/23/2018   Procedure: TRANSESOPHAGEAL ECHOCARDIOGRAM (TEE);  Surgeon: Lelon Perla, MD;  Location: [facility] ENDOSCOPY;  Service: Cardiovascular;  Laterality: N/A;  . UNILATERAL SALPINGECTOMY     Pt unsure of which fallopian tube was removed  . WISDOM TOOTH EXTRACTION      Family History  Problem Relation Age of Onset  . Diabetes Paternal Grandfather   . COPD Paternal Grandfather   . Heart disease Paternal Grandfather   . Cancer Paternal Grandfather        bone  . Cancer Paternal Grandmother   . Heart disease Paternal Grandmother   . Cancer Father   . Hypertension  Father   . Heart attack Father   . Alcohol abuse Father   . Depression Father   . Heart failure Mother   . Diabetes Mother   . Heart disease Mother   . Hyperlipidemia Mother   . Hypertension Mother   . Heart attack Mother   . Peripheral vascular disease Mother   . Depression Mother   . COPD Mother   . Hypertension Sister   . Heart attack Sister   . Stroke Maternal Grandmother   . Colon cancer Neg Hx   . Colon polyps Neg Hx   . Esophageal cancer Neg Hx   . Rectal cancer Neg Hx   . Stomach cancer Neg Hx     Social History   Socioeconomic History  . Marital status: Married    Spouse name: Not on file  . Number of children: Not on file  . Years of education: Not on file  . Highest education level: Not on file  Occupational History  . Not on file  Tobacco Use  . Smoking status: Current Every Day Smoker    Packs/day: 1.00    Years: 37.00    Pack years: 37.00    Types: Cigarettes  . Smokeless  tobacco: Never Used  Vaping Use  . Vaping Use: Never used  Substance and Sexual Activity  . Alcohol use: No    Alcohol/week: 0.0 standard drinks  . Drug use: No  . Sexual activity: Not Currently    Birth control/protection: I.U.D.    Comment: Mirena-1st intercourse 54 yo-More than 5 partners  Other Topics Concern  . Not on file  Social History Narrative  . Not on file   Social Determinants of Health   Financial Resource Strain:   . Difficulty of Paying Living Expenses:   Food Insecurity:   . Worried About Charity fundraiser in the Last Year:   . Arboriculturist in the Last Year:   Transportation Needs:   . Film/video editor (Medical):   Marland Kitchen Lack of Transportation (Non-Medical):   Physical Activity:   . Days of Exercise per Week:   . Minutes of Exercise per Session:   Stress:   . Feeling of Stress :   Social Connections:   . Frequency of Communication with Friends and Family:   . Frequency of Social Gatherings with Friends and Family:   . Attends Religious Services:   . Active Member of Clubs or Organizations:   . Attends Archivist Meetings:   Marland Kitchen Marital Status:      Observations/Objective: Awake, alert and oriented x 3   ROS  Assessment and Plan: Veronda was seen today for hospitalization follow-up.  Diagnoses and all orders for this visit:  Hospital discharge follow-up  Recurrent kidney stones -     Ambulatory referral to Urology  Chronic mesenteric ischemia [facility])  Follow-up with vascular surgery on 11/03/2019 Follow-up with gastroenterology on Nov 21, 2019  Stenosis of left internal carotid artery Follow-up with vascular surgery on 11/03/2019  Aortic atherosclerosis [facility]) Follow-up with cardiology on 10/25/2019  Recurrent Kidney Stones Referral placed to urology.  Follow Up Instructions Return in about 2 months (around 12/05/2019).     I discussed the assessment and treatment plan with the patient. The patient was provided an opportunity  to ask questions and all were answered. The patient agreed with the plan and demonstrated an understanding of the instructions.   The patient was advised to call back or seek an in-person evaluation if the  symptoms worsen or if the condition fails to improve as anticipated.  I provided 25 minutes of non-face-to-face time during this encounter including median intraservice time, reviewing previous notes, labs, imaging, medications and explaining diagnosis and management.  [name], FNP-BC

## 2019-10-09 ENCOUNTER — Other Ambulatory Visit: Payer: 59

## 2019-10-12 ENCOUNTER — Ambulatory Visit (INDEPENDENT_AMBULATORY_CARE_PROVIDER_SITE_OTHER): Payer: 59 | Admitting: Podiatry

## 2019-10-12 ENCOUNTER — Encounter: Payer: Self-pay | Admitting: Podiatry

## 2019-10-12 ENCOUNTER — Other Ambulatory Visit: Payer: Self-pay

## 2019-10-12 DIAGNOSIS — E1142 Type 2 diabetes mellitus with diabetic polyneuropathy: Secondary | ICD-10-CM | POA: Diagnosis not present

## 2019-10-12 DIAGNOSIS — M79676 Pain in unspecified toe(s): Secondary | ICD-10-CM

## 2019-10-12 DIAGNOSIS — B351 Tinea unguium: Secondary | ICD-10-CM | POA: Diagnosis not present

## 2019-10-12 MED ORDER — PREGABALIN 75 MG PO CAPS
75.0000 mg | ORAL_CAPSULE | Freq: Two times a day (BID) | ORAL | 3 refills | Status: AC
Start: 2019-10-12 — End: ?

## 2019-10-12 MED ORDER — KETOCONAZOLE 2 % EX CREA: 1.0000 "application " | TOPICAL_CREAM | Freq: Two times a day (BID) | CUTANEOUS | 2 refills | Status: AC

## 2019-10-12 NOTE — Progress Notes (Signed)
Subjective:  Patient ID: Pamela Carney, female    DOB: [birth year],  MRN: [id number] HPI Chief Complaint  Patient presents with  . Nail Problem    Toenails bilateral - thick and discolored, unable to cut herselt, requesting nail trim, also skin is peeling, tried multiple creams, also taking Cymbalta for neuropathy, "any other options"  . Diabetes    Last a1c was 7.5  . New Patient (Initial Visit)    54 y.o. female presents with the above complaint.   ROS: Denies fever chills nausea vomiting muscle aches pains calf pain back pain chest pain shortness of breath.  Past Medical History:  Diagnosis Date  . Abnormal Pap smear   . AKI (acute kidney injury) ([place]) 12/28/2017  . Anxiety   . Arthritis    bil knees, neck  . Bipolar 1 disorder ([place])   . Cataract    Mild  . Cholelithiasis 12/23/2017   noted on CT renal, pt unaware  . Chronic kidney disease (CKD), stage III (moderate)   . Cystitis   . Depression   . Diabetes mellitus without complication ([place])    type 2  . Genital warts    Hx of genital  . GERD (gastroesophageal reflux disease)   . Heart murmur    childhood  . Hematuria   . History of blood transfusion   . History of ectopic pregnancy   . History of gestational diabetes   . History of kidney stones   . History of sepsis    after ectopic pregnancy  . Hyperlipidemia   . Hypertension   . Idiopathic peripheral neuropathy    both feet  . Leg ulcer, left ([place])   . Low back pain   . Migraines   . Obesity   . Oral candidiasis   . Pneumonia   . PONV (postoperative nausea and vomiting)    prolonged sedation  . Recurrent UTI   . 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 Sigmoid diverticulosis 12/23/2017   noted on CT renal, pt unaware  . Sleep apnea    uses cpap  . 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 Ulcer of foot ([place])    Left  . Weakness    Past Surgical History:    Procedure Laterality Date  . BUBBLE STUDY  12/23/2018   Procedure: BUBBLE STUDY;  Surgeon: Lelon Perla, MD;  Location: [facility] ENDOSCOPY;  Service: Cardiovascular;;  . CERVICAL CONE BIOPSY    . CESAREAN SECTION     x3  . CYSTOSCOPY W/ URETERAL STENT PLACEMENT Right 12/15/2018   Procedure: Cystoscopy, right retrograde ureteropyelogram, fluoroscopic interpretation, right double-J stent placement (24 cm x 6 [place]);  Surgeon: Franchot Gallo, MD;  Location: [place];  Service: Urology;  Laterality: Right;  . CYSTOSCOPY WITH RETROGRADE PYELOGRAM, URETEROSCOPY AND STENT PLACEMENT Right 01/27/2019   Procedure: CYSTOSCOPY WITH RETROGRADE PYELOGRAM, URETEROSCOPY AND STENT PLACEMENT; BASKET STONE RETRIEVAL;  Surgeon: Alexis Frock, MD;  Location: [facility] ORS;  Service: Urology;  Laterality: Right;  75 MINS  . CYSTOSCOPY/URETEROSCOPY/HOLMIUM LASER/STENT PLACEMENT Right 02/04/2018   Procedure: CYSTOSCOPY/URETEROSCOPY/RETROGRADE PYELOGRAM/HOLMIUM LASER/STENT PLACEMENT;  Surgeon: Alexis Frock, MD;  Location: [facility];  Service: Urology;  Laterality: Right;  . CYSTOSCOPY/URETEROSCOPY/HOLMIUM LASER/STENT PLACEMENT Right 02/07/2018   Procedure: CYSTOSCOPY LEFT STENT EXCHANGE;  Surgeon: Alexis Frock, MD;  Location: [facility];  Service: Urology;  Laterality: Right;  . DILATION AND CURETTAGE OF UTERUS    .  ECTOPIC PREGNANCY SURGERY    . IR NEPHROSTOMY PLACEMENT LEFT  12/25/2017  . LOOP RECORDER INSERTION N/A 12/23/2018   Procedure: LOOP RECORDER INSERTION;  Surgeon: Constance Haw, MD;  Location: [facility];  Service: Cardiovascular;  Laterality: N/A;  . NEPHROLITHOTOMY Left 02/04/2018   Procedure: NEPHROLITHOTOMY PERCUTANEOUS;  Surgeon: Alexis Frock, MD;  Location: [facility];  Service: Urology;  Laterality: Left;  3 HRS  . NEPHROLITHOTOMY Left 02/07/2018   Procedure: SECOND LOOK NEPHROLITHOTOMY PERCUTANEOUS;  Surgeon: Alexis Frock, MD;  Location: [facility];  Service: Urology;  Laterality: Left;  2 HRS   . OVARIAN CYST REMOVAL    . TEE WITHOUT CARDIOVERSION N/A 12/23/2018   Procedure: TRANSESOPHAGEAL ECHOCARDIOGRAM (TEE);  Surgeon: Lelon Perla, MD;  Location[facility] ENDOSCOPY;  Service: Cardiovascular;  Laterality: N/A;  . UNILATERAL SALPINGECTOMY     Pt unsure of which fallopian tube was removed  . WISDOM TOOTH EXTRACTION      Current Outpatient Medications:  .  aspirin 81 MG chewable tablet, Chew 1 tablet (81 mg total) by mouth daily., Disp: 90 tablet, Rfl: 0 .  atorvastatin (LIPITOR) 80 MG tablet, Take 1 tablet (80 mg total) by mouth daily at 6 PM., Disp: 90 tablet, Rfl: 2 .  AZO CRANBERRY GUMMIES PO, Take 2 tablets by mouth daily., Disp: , Rfl:  .  Blood Glucose Monitoring Suppl (ONETOUCH VERIO) w/Device KIT, Use to check blood sugars every morning fasting and 2 hours after largest meal (Patient taking differently: 1 each by Other route See admin instructions. Use to check blood sugars every morning fasting and 2 hours after largest meal), Disp: 1 kit, Rfl: 0 .  cetirizine (ZYRTEC) 10 MG tablet, Take 1 tablet (10 mg total) by mouth daily., Disp: 90 tablet, Rfl: 2 .  DULoxetine (CYMBALTA) 60 MG capsule, TAKE 1 CAPSULE BY MOUTH DAILY (Patient taking differently: Take 60 mg by mouth daily. ), Disp: 90 capsule, Rfl: 1 .  fluconazole (DIFLUCAN) 150 MG tablet, Take 150 mg by mouth once., Disp: , Rfl:  .  fluticasone (FLONASE) 50 MCG/ACT nasal spray, Place 1 spray into both nostrils daily. (Patient not taking: Reported on 08/30/2019), Disp: 16 g, Rfl: 2 .  glucose blood (ONETOUCH VERIO) test strip, Use to check blood sugars every morning fasting and 2 hours after largest meal (Patient taking differently: 1 each by Other route See admin instructions. Use to check blood sugars every morning fasting and 2 hours after largest meal), Disp: 200 each, Rfl: 3 .  ibuprofen (ADVIL) 200 MG tablet, Take 200 mg by mouth every 6 (six) hours as needed for moderate pain., Disp: , Rfl:  .  insulin glargine (LANTUS)  100 UNIT/ML Solostar Pen, Inject 15 Units into the skin at bedtime. (Patient taking differently: Inject 23 Units into the skin at bedtime. ), Disp: 15 mL, Rfl: prn .  Insulin Pen Needle (PEN NEEDLES) 31G X 8 MM MISC, Use as instructed. Inject into the skin once nightly. (Patient taking differently: 1 each by Other route See admin instructions. Use as instructed. Inject into the skin once nightly.), Disp: 100 each, Rfl: 3 .  Multiple Vitamins-Minerals (ONE-A-DAY WOMENS 50+ ADVANTAGE) TABS, Take 1 tablet by mouth daily., Disp: , Rfl:  .  ondansetron (ZOFRAN) 4 MG tablet, Take 1 tablet (4 mg total) by mouth every 8 (eight) hours as needed for nausea or vomiting., Disp: 20 tablet, Rfl: 1 .  ONE TOUCH LANCETS MISC, Use to check blood sugars every morning fasting and 2 hours  after largest meal (Patient taking differently: 1 each by Other route See admin instructions. Use to check blood sugars every morning fasting and 2 hours after largest meal), Disp: 200 each, Rfl: 3 .  ticagrelor (BRILINTA) 90 MG TABS tablet, Take 1 tablet (90 mg total) by mouth 2 (two) times daily., Disp: 60 tablet, Rfl: 0 .  topiramate (TOPAMAX) 100 MG tablet, Take 1 tablet (100 mg total) by mouth 2 (two) times daily. (Patient taking differently: Take 50 mg by mouth 2 (two) times daily. ), Disp: 180 tablet, Rfl: 3  Allergies  Allergen Reactions  . Sulfa Antibiotics Hives and Swelling    Swelling site not recalled  . Latex Rash  . Tape Rash    Not tolerated well   Review of Systems Objective:  There were no vitals filed for this visit.  General: Well developed, nourished, in no acute distress, alert and oriented x3   Dermatological: Skin is warm, dry and supple bilateral. Nails x 10 are poorly maintained.  Nails; remaining integument appears unremarkable at this time. There are no open sores, no preulcerative lesions, no rash or signs of infection present.  Thick yellow dystrophic clinically mycotic she also has tinea pedis left  foot only  Vascular: Dorsalis Pedis artery and Posterior Tibial artery pedal pulses are 1/4 bilateral with immedate capillary fill time. Pedal hair growth present. No varicosities and no lower extremity edema present bilateral.   Neruologic: Grossly intact via light touch bilateral. Vibratory intact via tuning fork bilateral. Protective threshold with Semmes Wienstein monofilament diminished to all pedal sites bilateral. Patellar and Achilles deep tendon reflexes 2+ bilateral. No Babinski or clonus noted bilateral.   Musculoskeletal: No gross boney pedal deformities bilateral. No pain, crepitus, or limitation noted with foot and ankle range of motion bilateral. Muscular strength 5/5 in all groups tested bilateral.  Gait: Unassisted, Nonantalgic.    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  Pain in limb secondary to onychomycosis neuropathy.  Tinea pedis.  Plan: Discussed etiology pathology conservative surgical therapies we will start her on Lyrica 75 mg we discussed the possibility of side effects with Lyrica we discussed this in great detail today she still like to try the medication.  75 mg 1 p.o. twice daily and I debrided nails 1 through 5 bilaterally also started her on ketoconazole for the tinea pedis.     Garett Tetzloff T. [place], [place]

## 2019-10-20 ENCOUNTER — Ambulatory Visit: Payer: 59 | Admitting: Nurse Practitioner

## 2019-10-21 ENCOUNTER — Other Ambulatory Visit: Payer: Self-pay

## 2019-10-21 ENCOUNTER — Emergency Department (HOSPITAL_COMMUNITY)
Admission: EM | Admit: 2019-10-21 | Discharge: 2019-10-21 | Disposition: A | Discharge status: Home / Self Care | Payer: 59 | Source: Home / Self Care | Attending: Emergency Medicine | Admitting: Emergency Medicine

## 2019-10-21 ENCOUNTER — Emergency Department (HOSPITAL_COMMUNITY): Payer: 59

## 2019-10-21 DIAGNOSIS — E1169 Type 2 diabetes mellitus with other specified complication: Secondary | ICD-10-CM | POA: Insufficient documentation

## 2019-10-21 DIAGNOSIS — Z91040 Latex allergy status: Secondary | ICD-10-CM | POA: Insufficient documentation

## 2019-10-21 DIAGNOSIS — R531 Weakness: Secondary | ICD-10-CM | POA: Insufficient documentation

## 2019-10-21 DIAGNOSIS — D3500 Benign neoplasm of unspecified adrenal gland: Secondary | ICD-10-CM | POA: Insufficient documentation

## 2019-10-21 DIAGNOSIS — Z794 Long term (current) use of insulin: Secondary | ICD-10-CM | POA: Insufficient documentation

## 2019-10-21 DIAGNOSIS — N183 Chronic kidney disease, stage 3 unspecified: Secondary | ICD-10-CM | POA: Insufficient documentation

## 2019-10-21 DIAGNOSIS — Z79899 Other long term (current) drug therapy: Secondary | ICD-10-CM | POA: Insufficient documentation

## 2019-10-21 DIAGNOSIS — R5381 Other malaise: Secondary | ICD-10-CM

## 2019-10-21 DIAGNOSIS — I129 Hypertensive chronic kidney disease with stage 1 through stage 4 chronic kidney disease, or unspecified chronic kidney disease: Secondary | ICD-10-CM | POA: Insufficient documentation

## 2019-10-21 DIAGNOSIS — Z7982 Long term (current) use of aspirin: Secondary | ICD-10-CM | POA: Insufficient documentation

## 2019-10-21 DIAGNOSIS — F17210 Nicotine dependence, cigarettes, uncomplicated: Secondary | ICD-10-CM | POA: Insufficient documentation

## 2019-10-21 DIAGNOSIS — I639 Cerebral infarction, unspecified: Secondary | ICD-10-CM | POA: Diagnosis not present

## 2019-10-21 LAB — COMPREHENSIVE METABOLIC PANEL
ALT: 19 U/L (ref 0–44)
AST: 16 U/L (ref 15–41)
Albumin: 3.1 g/dL — ABNORMAL LOW (ref 3.5–5.0)
Alkaline Phosphatase: 110 U/L (ref 38–126)
Anion gap: 10 (ref 5–15)
BUN: 17 mg/dL (ref 6–20)
CO2: 21 mmol/L — ABNORMAL LOW (ref 22–32)
Calcium: 9.3 mg/dL (ref 8.9–10.3)
Chloride: 107 mmol/L (ref 98–111)
Creatinine, Ser: 1.43 mg/dL — ABNORMAL HIGH (ref 0.44–1.00)
GFR calc Af Amer: 49 mL/min — ABNORMAL LOW (ref >60)
GFR calc non Af Amer: 42 mL/min — ABNORMAL LOW (ref >60)
Glucose, Bld: 255 mg/dL — ABNORMAL HIGH (ref 70–99)
Potassium: 3.6 mmol/L (ref 3.5–5.1)
Sodium: 138 mmol/L (ref 135–145)
Total Bilirubin: 0.4 mg/dL (ref 0.3–1.2)
Total Protein: 6.5 g/dL (ref 6.5–8.1)

## 2019-10-21 LAB — I-STAT CHEM 8, ED
BUN: 19 mg/dL (ref 6–20)
Calcium, Ion: 1.28 mmol/L (ref 1.15–1.40)
Chloride: 108 mmol/L (ref 98–111)
Creatinine, Ser: 1.4 mg/dL — ABNORMAL HIGH (ref 0.44–1.00)
Glucose, Bld: 257 mg/dL — ABNORMAL HIGH (ref 70–99)
HCT: 40 % (ref 36.0–46.0)
Hemoglobin: 13.6 g/dL (ref 12.0–15.0)
Potassium: 3.6 mmol/L (ref 3.5–5.1)
Sodium: 141 mmol/L (ref 135–145)
TCO2: 20 mmol/L — ABNORMAL LOW (ref 22–32)

## 2019-10-21 LAB — CBC
HCT: 41.4 % (ref 36.0–46.0)
Hemoglobin: 13.2 g/dL (ref 12.0–15.0)
MCH: 31.8 pg (ref 26.0–34.0)
MCHC: 31.9 g/dL (ref 30.0–36.0)
MCV: 99.8 fL (ref 80.0–100.0)
Platelets: 321 10*3/uL (ref 150–400)
RBC: 4.15 MIL/uL (ref 3.87–5.11)
RDW: 13.2 % (ref 11.5–15.5)
WBC: 11.9 10*3/uL — ABNORMAL HIGH (ref 4.0–10.5)
nRBC: 0 % (ref 0.0–0.2)

## 2019-10-21 LAB — DIFFERENTIAL
Abs Immature Granulocytes: 0.05 10*3/uL (ref 0.00–0.07)
Basophils Absolute: 0.1 10*3/uL (ref 0.0–0.1)
Basophils Relative: 1 %
Eosinophils Absolute: 0.2 10*3/uL (ref 0.0–0.5)
Eosinophils Relative: 1 %
Immature Granulocytes: 0 %
Lymphocytes Relative: 16 %
Lymphs Abs: 1.9 10*3/uL (ref 0.7–4.0)
Monocytes Absolute: 0.5 10*3/uL (ref 0.1–1.0)
Monocytes Relative: 5 %
Neutro Abs: 9.1 10*3/uL — ABNORMAL HIGH (ref 1.7–7.7)
Neutrophils Relative %: 77 %

## 2019-10-21 LAB — APTT: aPTT: 30 seconds (ref 24–36)

## 2019-10-21 LAB — I-STAT BETA HCG BLOOD, ED (MC, WL, AP ONLY): I-stat hCG, quantitative: <5 m[IU]/mL (ref <5)

## 2019-10-21 LAB — CBG MONITORING, ED: Glucose-Capillary: 172 mg/dL — ABNORMAL HIGH (ref 70–99)

## 2019-10-21 LAB — PROTIME-INR
INR: 1 (ref 0.8–1.2)
Prothrombin Time: 12.3 seconds (ref 11.4–15.2)

## 2019-10-21 IMAGING — CT CT HEAD W/O CM
4 series · 15 of 47 positions shown, 17 images · non-contrast
Comparison: [DATE]

CLINICAL DATA: Generalized weakness for 1 day

EXAM:
CT HEAD WITHOUT CONTRAST
TECHNIQUE: Contiguous axial images were obtained from the base of the skull
through the vertex without intravenous contrast.

[Series 3: head without · axial · non-contrast · 0.41mm/px · z∈[-181,-61]mm · 7 of 34 slices shown, 9 images]
[im 5/34  brain]
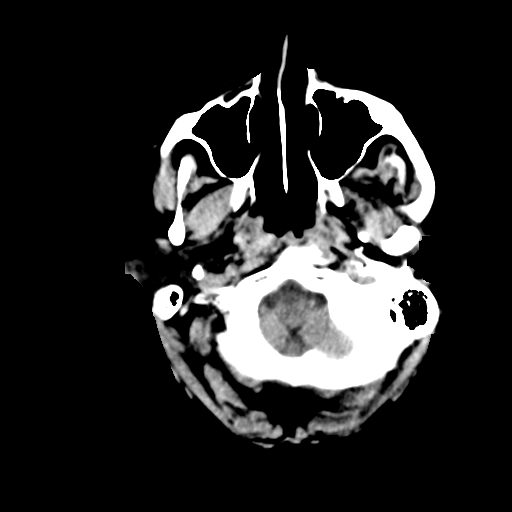
[im 5/34  bone]
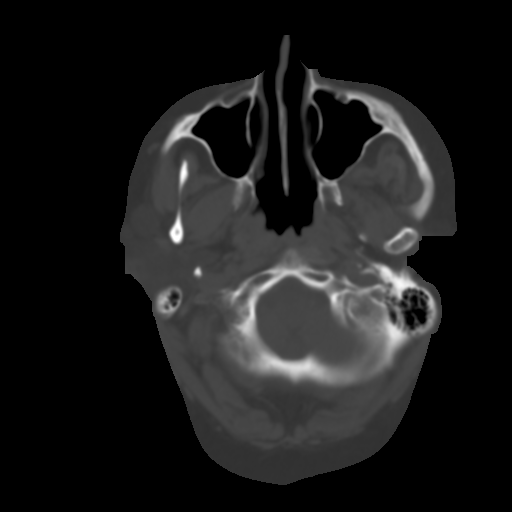
[im 9/34  brain]
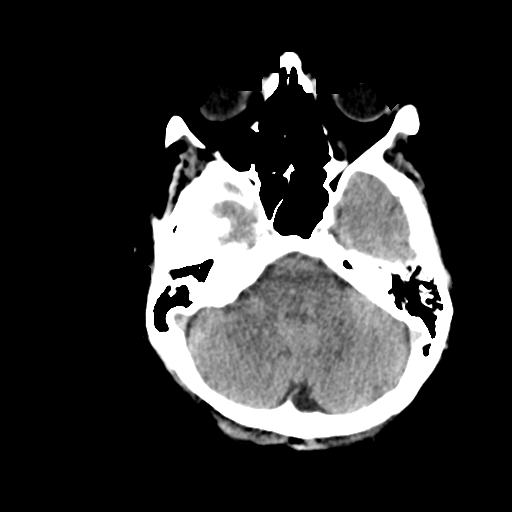
[im 13/34  brain]
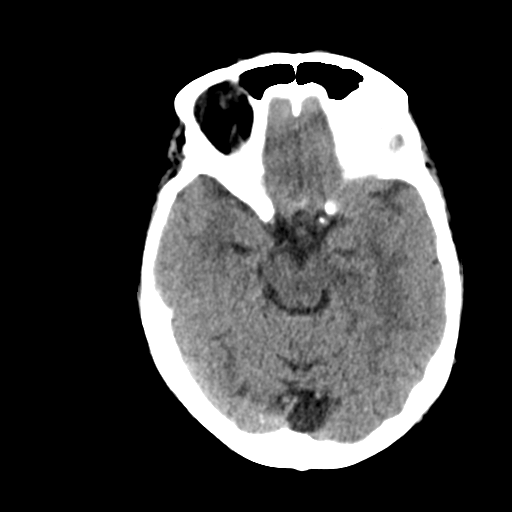
[im 17/34  brain]
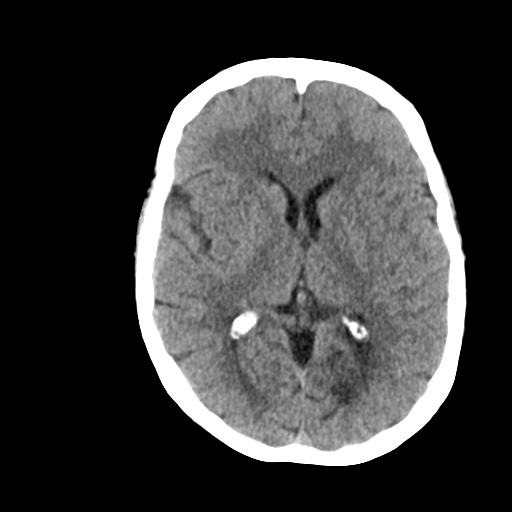
[im 21/34  brain]
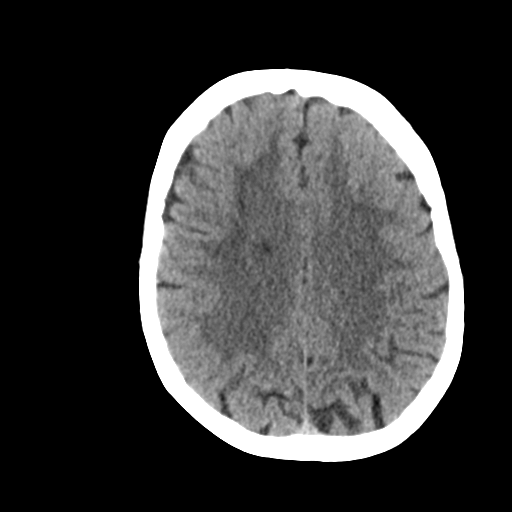
[im 21/34  bone]
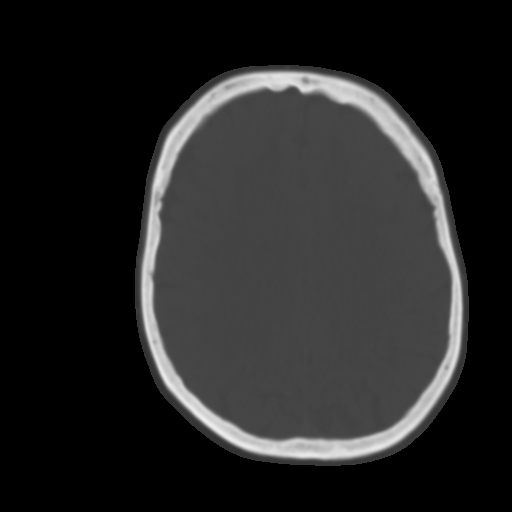
[im 25/34  brain]
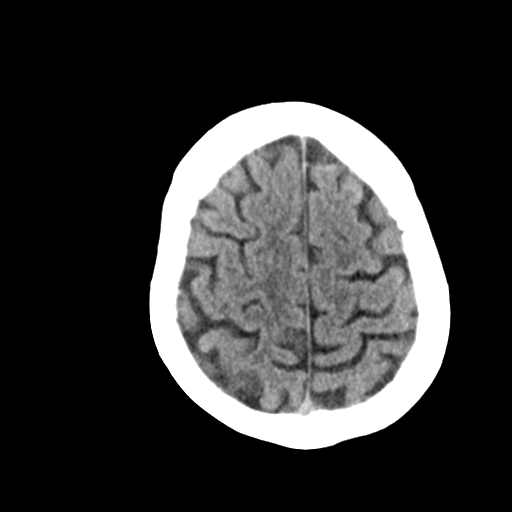
[im 29/34  brain]
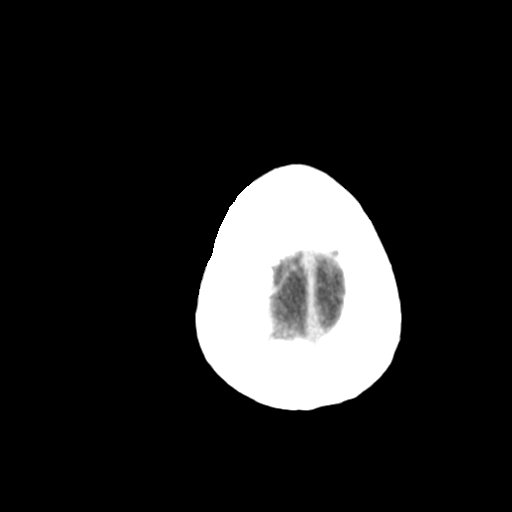

[Series 4: head bone · axial · 0.41mm/px · z∈[-185,-169]mm · 2 of 85 slices shown]
[im 9/85  bone]
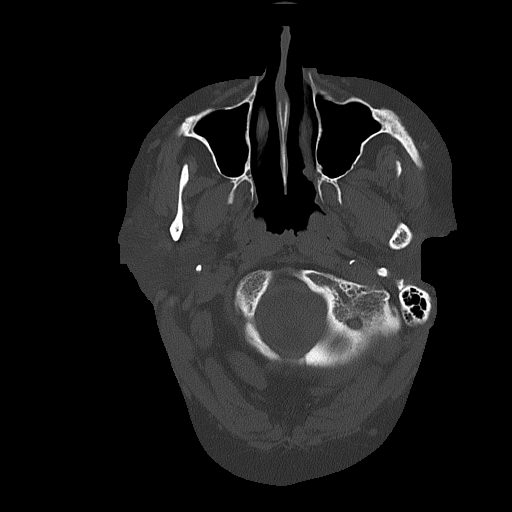
[im 17/85  bone]
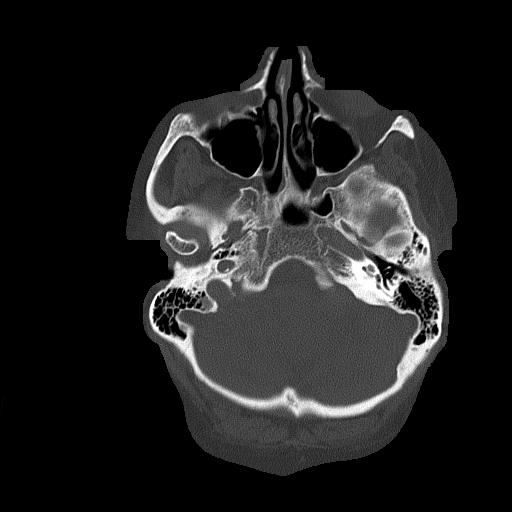

[Series 5: head without cor · coronal · non-contrast · 0.33mm/px · 3 of 69 slices shown]
[im 23/69  brain]
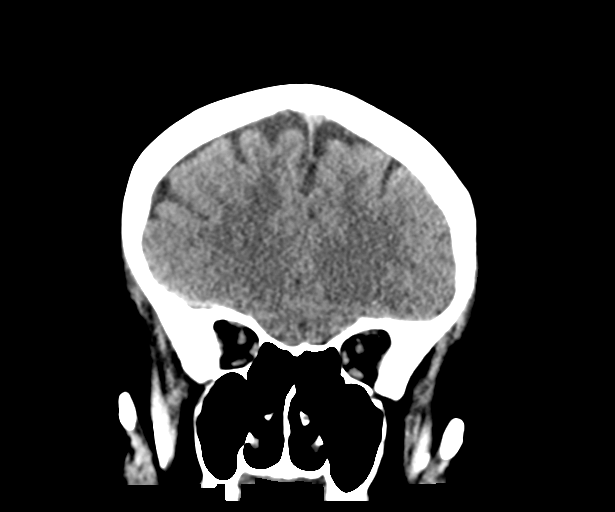
[im 31/69  brain]
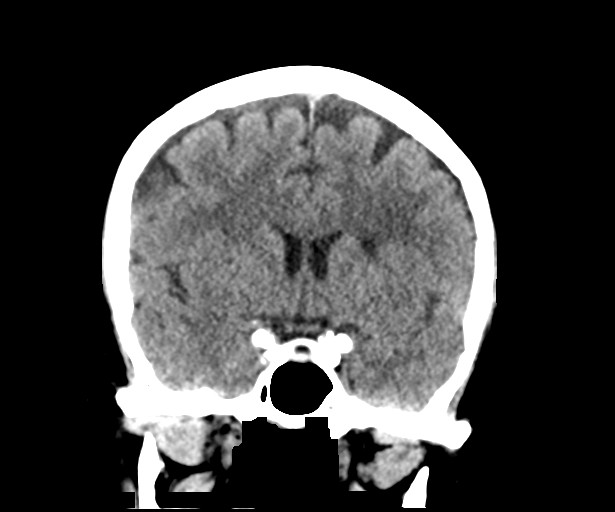
[im 38/69  brain]
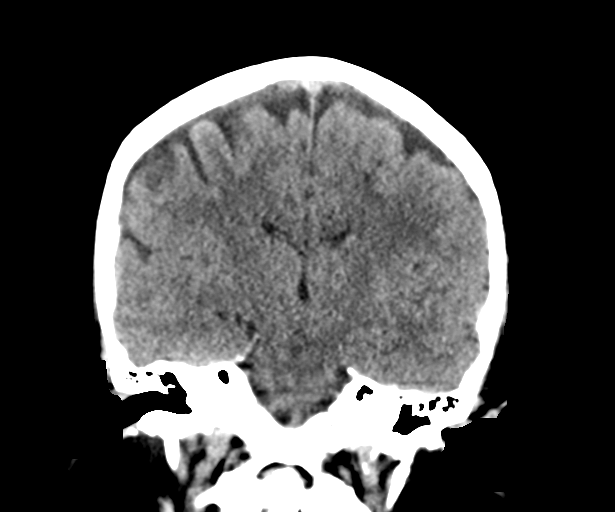

[Series 6: head without sag · sagittal · non-contrast · 0.33mm/px · 3 of 67 slices shown]
[im 23/67  brain]
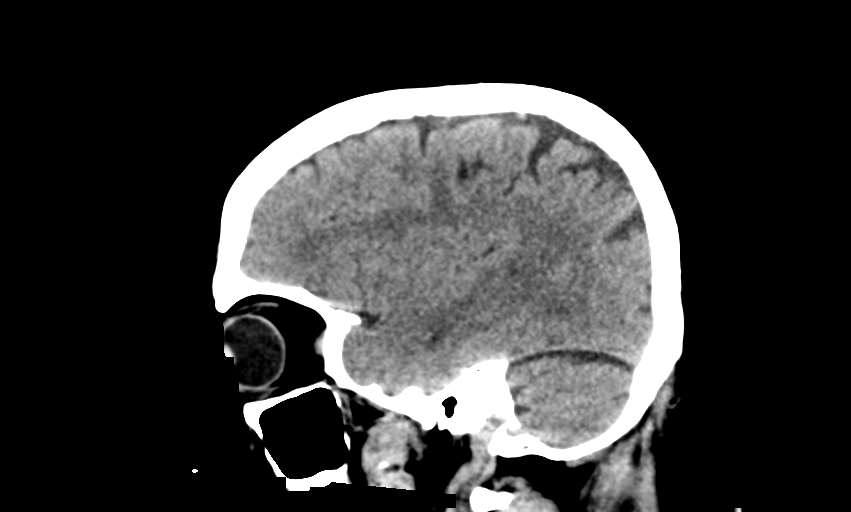
[im 34/67  brain]
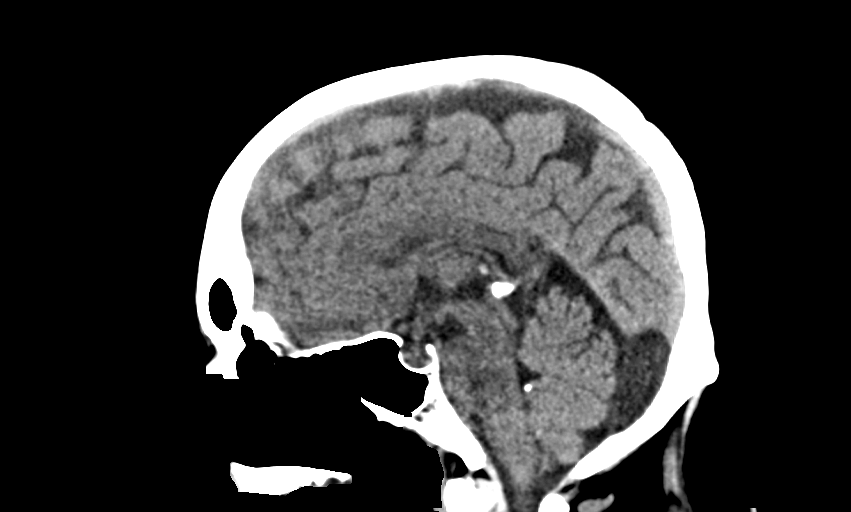
[im 45/67  brain]
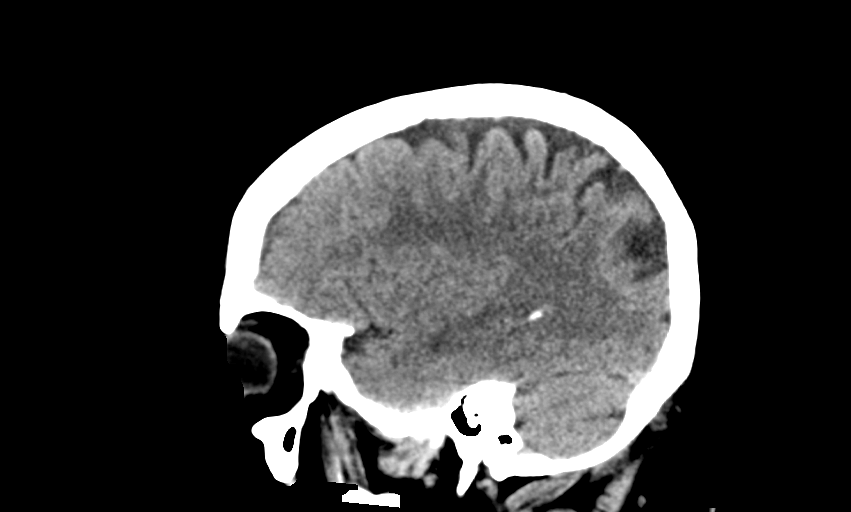

[15 of 47 positions shown; findings below may reference images not displayed]

FINDINGS: Brain: No findings to suggest acute hemorrhage, acute infarction or
space-occupying mass lesion. The small punctate areas of acute
ischemia seen on recent MRI are not well appreciated on this exam.
Prior lacunar infarct in the left basal ganglia is noted. Mild
chronic white matter ischemic changes are seen.

Vascular: No hyperdense vessel or unexpected calcification.

Skull: Normal. Negative for fracture or focal lesion.

Sinuses/Orbits: No acute finding.

Other: None.
IMPRESSION: Chronic ischemic changes stable from the prior study. No acute
abnormality noted.

## 2019-10-21 MED ORDER — SODIUM CHLORIDE 0.9% FLUSH: 3.0000 mL | Freq: Once | INTRAVENOUS | Status: DC

## 2019-10-21 NOTE — Discharge Instructions (Signed)
Make sure you are getting plenty of rest, drinking a lot of fluids, eating 3 meals a day, and taking all of your medicines as directed.  Call your PCP for follow-up appointment as soon as possible.

## 2019-10-21 NOTE — ED Triage Notes (Signed)
Patient reports "stroke like symptoms" X1 day. States she has had inc generalized weakness/lethargy. No neuro deficits noted. Equal grips, no unilateral weakness, no numbness/tingling.

## 2019-10-21 NOTE — ED Provider Notes (Signed)
[facility] EMERGENCY DEPARTMENT Provider Note   CSN: [id number] Arrival date & time: 10/21/19  0149     History Chief Complaint  Patient presents with  . Stroke like S/S    Pamela Carney is a 54 y.o. female.  HPI She is here for evaluation of weakness.  She is worried that she has had a stroke.  She does not describe focal weakness just feels overall weak.  She has been seeing her PCP who has recently indicated that the patient has been referred to vascular surgery, and GI, for evaluation and treatment of chronic mesenteric ischemia.  She was placed on Brilinta after recent hospitalization, with evaluation for stroke.  She has been referred to vascular surgery for management of left carotid stenosis.  This was not felt to be the cause of her recent stroke.  She denies fever, vomiting, dizziness or paresthesia.  She has had chronic diarrhea for 4 months.  She denies fever, chills, cough or shortness of breath.  There are no other known modifying factors.    Past Medical History:  Diagnosis Date  . Abnormal Pap smear   . AKI (acute kidney injury) ([place]) 12/28/2017  . Anxiety   . Arthritis    bil knees, neck  . Bipolar 1 disorder ([place])   . Cataract    Mild  . Cholelithiasis 12/23/2017   noted on CT renal, pt unaware  . Chronic kidney disease (CKD), stage III (moderate)   . Cystitis   . Depression   . Diabetes mellitus without complication ([place])    type 2  . Genital warts    Hx of genital  . GERD (gastroesophageal reflux disease)   . Heart murmur    childhood  . Hematuria   . History of blood transfusion   . History of ectopic pregnancy   . History of gestational diabetes   . History of kidney stones   . History of sepsis    after ectopic pregnancy  . Hyperlipidemia   . Hypertension   . Idiopathic peripheral neuropathy    both feet  . Leg ulcer, left ([place])   . Low back pain   . Migraines   . Obesity   . Oral candidiasis   . Pneumonia   .  PONV (postoperative nausea and vomiting)    prolonged sedation  . Recurrent UTI   . 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 Sigmoid diverticulosis 12/23/2017   noted on CT renal, pt unaware  . Sleep apnea    uses cpap  . 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 Ulcer of foot ([place])    Left  . Weakness     Patient Active Problem List   Diagnosis Date Noted  . Chronic mesenteric ischemia ([place]) 09/25/2019  . Adenoma of right adrenal gland 09/25/2019  . Hypovolemia   . Diarrhea   . Dizziness 09/23/2019  . Acute renal failure (ARF) ([place]) 06/21/2019  . Nausea 06/21/2019  . Dehydration 06/21/2019  . HLD (hyperlipidemia) 06/21/2019  . Acute metabolic encephalopathy 92/42/6834  . UTI (urinary tract infection) 04/27/2019  . Cerebrovascular accident (CVA) ([place]) 12/22/2018  . Septic shock ([place]) 12/15/2018  . Ureteral stone with hydronephrosis   . Acute renal failure superimposed on stage 3 chronic kidney disease ([place])   . Type 2 diabetes mellitus with other specified complication ([place]) 19/62/2297  . Bipolar 1 disorder ([place]) 02/01/2018  .  IUD (intrauterine device) in place 02/01/2018  . Obesity 02/01/2018  . Healthcare maintenance 02/01/2018  . CKD (chronic kidney disease), stage III 12/28/2017  . 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 Acute cystitis with hematuria   . Chronic interstitial nephritis 12/24/2017  . Generalized weakness 12/24/2017  . Sepsis ([place]) 12/24/2017  . Hypokalemia 12/24/2017  . Oral candidiasis 12/24/2017  . Neuropathy involving both lower extremities 01/31/2014  . Perimenopausal 12/29/2012  . Family planning, IUD (intrauterine device) check/reinsertion/removal 12/28/2012  . Pain 12/27/2012    Past Surgical History:  Procedure Laterality Date  . BUBBLE STUDY  12/23/2018   Procedure: BUBBLE STUDY;  Surgeon: Lelon Perla, MD;  Location: [facility] ENDOSCOPY;  Service: Cardiovascular;;  .  CERVICAL CONE BIOPSY    . CESAREAN SECTION     x3  . CYSTOSCOPY W/ URETERAL STENT PLACEMENT Right 12/15/2018   Procedure: Cystoscopy, right retrograde ureteropyelogram, fluoroscopic interpretation, right double-J stent placement (24 cm x 6 [place]);  Surgeon: Franchot Gallo, MD;  Location: [facility];  Service: Urology;  Laterality: Right;  . CYSTOSCOPY WITH RETROGRADE PYELOGRAM, URETEROSCOPY AND STENT PLACEMENT Right 01/27/2019   Procedure: CYSTOSCOPY WITH RETROGRADE PYELOGRAM, URETEROSCOPY AND STENT PLACEMENT; BASKET STONE RETRIEVAL;  Surgeon: Alexis Frock, MD;  Location: [facility];  Service: Urology;  Laterality: Right;  75 MINS  . CYSTOSCOPY/URETEROSCOPY/HOLMIUM LASER/STENT PLACEMENT Right 02/04/2018   Procedure: CYSTOSCOPY/URETEROSCOPY/RETROGRADE PYELOGRAM/HOLMIUM LASER/STENT PLACEMENT;  Surgeon: Alexis Frock, MD;  Location: [facility]S;  Service: Urology;  Laterality: Right;  . CYSTOSCOPY/URETEROSCOPY/HOLMIUM LASER/STENT PLACEMENT Right 02/07/2018   Procedure: CYSTOSCOPY LEFT STENT EXCHANGE;  Surgeon: Alexis Frock, MD;  Location: [facility];  Service: Urology;  Laterality: Right;  . DILATION AND CURETTAGE OF UTERUS    . ECTOPIC PREGNANCY SURGERY    . IR NEPHROSTOMY PLACEMENT LEFT  12/25/2017  . LOOP RECORDER INSERTION N/A 12/23/2018   Procedure: LOOP RECORDER INSERTION;  Surgeon: Constance Haw, MD;  Location: [facility] CV LAB;  Service: Cardiovascular;  Laterality: N/A;  . NEPHROLITHOTOMY Left 02/04/2018   Procedure: NEPHROLITHOTOMY PERCUTANEOUS;  Surgeon: Alexis Frock, MD;  Location: [facility];  Service: Urology;  Laterality: Left;  3 HRS  . NEPHROLITHOTOMY Left 02/07/2018   Procedure: SECOND LOOK NEPHROLITHOTOMY PERCUTANEOUS;  Surgeon: Alexis Frock, MD;  Location: [facility];  Service: Urology;  Laterality: Left;  2 HRS  . OVARIAN CYST REMOVAL    . TEE WITHOUT CARDIOVERSION N/A 12/23/2018   Procedure: TRANSESOPHAGEAL ECHOCARDIOGRAM (TEE);  Surgeon: Lelon Perla, MD;  [facility]  ENDOSCOPY;  Service: Cardiovascular;  Laterality: N/A;  . UNILATERAL SALPINGECTOMY     Pt unsure of which fallopian tube was removed  . WISDOM TOOTH EXTRACTION       OB History    Gravida  7   Para  3   Term      Preterm  3   AB  2   Living  3     SAB  1   TAB      Ectopic  1   Multiple      Live Births  5           Family History  Problem Relation Age of Onset  . Diabetes Paternal Grandfather   . COPD Paternal Grandfather   . Heart disease Paternal Grandfather   . Cancer Paternal Grandfather        bone  . Cancer Paternal Grandmother   . Heart disease Paternal Grandmother   . Cancer Father   . Hypertension Father   . Heart attack Father   .  Alcohol abuse Father   . Depression Father   . Heart failure Mother   . Diabetes Mother   . Heart disease Mother   . Hyperlipidemia Mother   . Hypertension Mother   . Heart attack Mother   . Peripheral vascular disease Mother   . Depression Mother   . COPD Mother   . Hypertension Sister   . Heart attack Sister   . Stroke Maternal Grandmother   . Colon cancer Neg Hx   . Colon polyps Neg Hx   . Esophageal cancer Neg Hx   . Rectal cancer Neg Hx   . Stomach cancer Neg Hx     Social History   Tobacco Use  . Smoking status: Current Every Day Smoker    Packs/day: 1.00    Years: 37.00    Pack years: 37.00    Types: Cigarettes  . Smokeless tobacco: Never Used  Vaping Use  . Vaping Use: Never used  Substance Use Topics  . Alcohol use: No    Alcohol/week: 0.0 standard drinks  . Drug use: No    Home Medications Prior to Admission medications   Medication Sig Start Date End Date Taking? Authorizing Provider  aspirin 81 MG chewable tablet Chew 1 tablet (81 mg total) by mouth daily. 09/26/19   Andrew Au, MD  atorvastatin (LIPITOR) 80 MG tablet Take 1 tablet (80 mg total) by mouth daily at 6 PM. 01/16/19   [name], NP  AZO CRANBERRY GUMMIES PO Take 2 tablets by mouth daily.    [provider]  Blood Glucose Monitoring Suppl (ONETOUCH VERIO) w/Device KIT Use to check blood sugars every morning fasting and 2 hours after largest meal Patient taking differently: 1 each by Other route See admin instructions. Use to check blood sugars every morning fasting and 2 hours after largest meal 02/02/18   Danford, Valetta Fuller D, NP  cetirizine (ZYRTEC) 10 MG tablet Take 1 tablet (10 mg total) by mouth daily. 05/25/19 08/30/19  [name], NP  DULoxetine (CYMBALTA) 60 MG capsule TAKE 1 CAPSULE BY MOUTH DAILY Patient taking differently: Take 60 mg by mouth daily.  09/13/19   Charlott Rakes, MD  fluconazole (DIFLUCAN) 150 MG tablet Take 150 mg by mouth once. 09/15/19   [provider]  fluticasone (FLONASE) 50 MCG/ACT nasal spray Place 1 spray into both nostrils daily. Patient not taking: Reported on 08/30/2019 06/24/19 09/22/19  Mercy Riding, MD  glucose blood (ONETOUCH VERIO) test strip Use to check blood sugars every morning fasting and 2 hours after largest meal Patient taking differently: 1 each by Other route See admin instructions. Use to check blood sugars every morning fasting and 2 hours after largest meal 02/23/19   [name], NP  ibuprofen (ADVIL) 200 MG tablet Take 200 mg by mouth every 6 (six) hours as needed for moderate pain.    [provider]  insulin glargine (LANTUS) 100 UNIT/ML Solostar Pen Inject 15 Units into the skin at bedtime. Patient taking differently: Inject 23 Units into the skin at bedtime.  08/28/19   [name], NP  Insulin Pen Needle (PEN NEEDLES) 31G X 8 MM MISC Use as instructed. Inject into the skin once nightly. Patient taking differently: 1 each by Other route See admin instructions. Use as instructed. Inject into the skin once nightly. 08/28/19   [name], NP  ketoconazole (NIZORAL) 2 % cream Apply 1 application topically 2 (two) times daily. 10/12/19   Hyatt, Max  T, DPM  Multiple Vitamins-Minerals (ONE-A-DAY WOMENS 50+  ADVANTAGE) TABS Take 1 tablet by mouth daily.    [provider]  ondansetron (ZOFRAN) 4 MG tablet Take 1 tablet (4 mg total) by mouth every 8 (eight) hours as needed for nausea or vomiting. 05/26/19   [name], NP  ONE TOUCH LANCETS MISC Use to check blood sugars every morning fasting and 2 hours after largest meal Patient taking differently: 1 each by Other route See admin instructions. Use to check blood sugars every morning fasting and 2 hours after largest meal 02/02/18   Danford, Valetta Fuller D, NP  pregabalin (LYRICA) 75 MG capsule Take 1 capsule (75 mg total) by mouth 2 (two) times daily. 10/12/19   Hyatt, Max T, DPM  ticagrelor (BRILINTA) 90 MG TABS tablet Take 1 tablet (90 mg total) by mouth 2 (two) times daily. 09/25/19   Andrew Au, MD  topiramate (TOPAMAX) 100 MG tablet Take 1 tablet (100 mg total) by mouth 2 (two) times daily. Patient taking differently: Take 50 mg by mouth 2 (two) times daily.  06/27/19 06/26/20  Frann Rider, NP    Allergies    Sulfa antibiotics, Latex, and Tape  Review of Systems   Review of Systems  Physical Exam Updated Vital Signs BP (!) 123/90   Pulse 64   Temp 98 F (36.7 C) (Oral)   Resp 19   Ht 5' 2"  (1.575 m)   Wt (!) 95.9 kg   SpO2 97%   BMI 38.67 kg/m   Physical Exam  ED Results / Procedures / Treatments   Labs (all labs ordered are listed, but only abnormal results are displayed) Labs Reviewed  CBC - Abnormal; Notable for the following components:      Result Value   WBC 11.9 (*)    All other components within normal limits  DIFFERENTIAL - Abnormal; Notable for the following components:   Neutro Abs 9.1 (*)    All other components within normal limits  COMPREHENSIVE METABOLIC PANEL - Abnormal; Notable for the following components:   CO2 21 (*)    Glucose, Bld 255 (*)    Creatinine, Ser 1.43 (*)    Albumin 3.1 (*)    GFR calc non Af Amer 42 (*)    GFR calc Af Amer 49 (*)    All other components within normal limits    I-STAT CHEM 8, ED - Abnormal; Notable for the following components:   Creatinine, Ser 1.40 (*)    Glucose, Bld 257 (*)    TCO2 20 (*)    All other components within normal limits  CBG MONITORING, ED - Abnormal; Notable for the following components:   Glucose-Capillary 172 (*)    All other components within normal limits  PROTIME-INR  APTT  I-STAT BETA HCG BLOOD, ED ([facility], [facility], [facility] ONLY)    EKG EKG Interpretation  Date/Time:  Saturday October 21 2019 02:00:37 EDT Ventricular Rate:  87 PR Interval:  146 QRS Duration: 94 QT Interval:  404 QTC Calculation: 486 R Axis:   68 Text Interpretation: Normal sinus rhythm Prolonged QT Abnormal ECG When compared with ECG of 09/23/2019, No significant change was found Confirmed by Delora Fuel ([id number]) on 10/21/2019 2:16:24 AM   Radiology CT HEAD WO CONTRAST  Result Date: 10/21/2019 CLINICAL DATA:  Generalized weakness for 1 day EXAM: CT HEAD WITHOUT CONTRAST TECHNIQUE: Contiguous axial images were obtained from the base of the skull through the vertex without intravenous contrast. COMPARISON:  09/23/2019 FINDINGS:  Brain: No findings to suggest acute hemorrhage, acute infarction or space-occupying mass lesion. The small punctate areas of acute ischemia seen on recent MRI are not well appreciated on this exam. Prior lacunar infarct in the left basal ganglia is noted. Mild chronic white matter ischemic changes are seen. Vascular: No hyperdense vessel or unexpected calcification. Skull: Normal. Negative for fracture or focal lesion. Sinuses/Orbits: No acute finding. Other: None. IMPRESSION: Chronic ischemic changes stable from the prior study. No acute abnormality noted. Electronically Signed   By: Inez Catalina M.D.   On: 10/21/2019 03:16    Procedures Procedures (including critical care time)  Medications Ordered in ED Medications  sodium chloride flush (NS) 0.9 % injection 3 mL (3 mLs Intravenous Not Given 10/21/19 1244)    ED Course  I have reviewed  the triage vital signs and the nursing notes.  Pertinent labs & imaging results that were available during my care of the patient were reviewed by me and considered in my medical decision making (see chart for details).  Clinical Course as of Oct 20 1905  Sat Oct 21, 2019  1303 Abnormal, high  CBG monitoring, ED(!) [EW]  1304 Normal  I-Stat beta hCG blood, ED [EW]  1304 Abnormal, elevated neutrophils  Differential(!) [EW]  1304 Normal except elevated creatinine and glucose.  Total CO2 low  I-stat chem 8, ED(!) [EW]  1304 Except CO2 low, glucose high, creatinine high, albumin low, GFR low  Comprehensive metabolic panel(!) [EW]  9924 Normal except white count high   [EW]  1304 No CVA.  Interpretation by radiology; chronic small vessel disease is present.  CT HEAD WO CONTRAST [EW]  2683 Patient daughter is here now and states that patient has been generally weak, since recent treatment in the hospital.  She has not noticed any distinct symptoms of acute stroke.  At this time the patient is sitting up and eating, and is requesting to go to the bathroom to urinate.   [EW]  1722 She was able to walk with assistance.   [EW]  4196 Consultation TOC, to arrange for home health services.   [EW]  1722 TOC requested that I order a DME 3 N 1 device.  This has been done.  They will contact her home health service for ongoing management.   [EW]    Clinical Course User Index [EW] Daleen Bo, MD   MDM Rules/Calculators/A&P                           Patient Vitals for the past 24 hrs:  BP Temp Temp src Pulse Resp SpO2 Height Weight  10/21/19 1758 (!) 123/90 -- -- 64 19 -- -- --  10/21/19 1731 (!) 111/54 -- -- 78 17 -- -- --  10/21/19 1643 (!) 135/89 98 F (36.7 C) Oral 94 19 97 % -- --  10/21/19 0934 (!) 138/90 -- -- 71 18 97 % -- --  10/21/19 0606 128/84 98.1 F (36.7 C) Oral 74 18 97 % -- --  10/21/19 0206 -- -- -- -- -- -- 5' 2"  (1.575 m) (!) 95.9 kg  10/21/19 0152 110/81 98.4 F  (36.9 C) Oral 89 18 95 % -- --    7:07 PM Reevaluation with update and discussion. After initial assessment and treatment, an updated evaluation reveals more alert, and able to walk.Daleen Bo   Medical Decision Making:  This patient is presenting for evaluation of nonspecific weakness, which does require a  range of treatment options, and is a complaint that involves a high risk of morbidity and mortality. The differential diagnoses include recurrent stroke, metabolic disorder, hypovolemia, malaise. I decided to review old records, and in summary chronically ill patient, presenting with weakness, without concerning historical symptoms for stroke.  Ongoing diarrhea, and recent hospitalization.  I did not require additional historical information from anyone.  Clinical Laboratory Tests Ordered, included CBC and Metabolic panel. Review indicates normal except mild hyperglycemia and renal insufficiency, near baseline.. Radiologic Tests Ordered, included CT head.  I independently Visualized: Radiographic images, which show no CVA, or tumor  Cardiac Monitor Tracing which shows normal sinus rhythm    Critical Interventions-clinical evaluation, laboratory testing, CT imaging, observation, orthostatic vital signs, ambulation trial, reassessment  After These Interventions, the Patient was reevaluated and was found stable for discharge with additional resources at home including home health services and a device to help her shower and toilet.  CRITICAL CARE-no Performed by: Daleen Bo  Nursing Notes Reviewed/ Care Coordinated Applicable Imaging Reviewed Interpretation of Laboratory Data incorporated into ED treatment  The patient appears reasonably screened and/or stabilized for discharge and I doubt any other medical condition or other [facility] requiring further screening, evaluation, or treatment in the ED at this time prior to discharge.  Plan: Home Medications-usual; Home Treatments-push  fluids in diet; return here if the recommended treatment, does not improve the symptoms; Recommended follow up-PCP checkup soon as possible.     Final Clinical Impression(s) / ED Diagnoses Final diagnoses:  Weakness  Malaise    Rx / DC Orders ED Discharge Orders    None       Daleen Bo, MD 10/21/19 1907

## 2019-10-21 NOTE — ED Notes (Signed)
Patient transported to CT 

## 2019-10-21 NOTE — ED Notes (Signed)
Pt ambulated very unsteady with assistance.

## 2019-10-21 NOTE — ED Notes (Signed)
Pt able to eat some of the food and drink with no nausea or vomiting.

## 2019-10-21 NOTE — ED Notes (Signed)
Food given to the pt, pt not able to ambulate at this time, unable to stand by her self.

## 2019-10-22 ENCOUNTER — Telehealth: Payer: Self-pay | Admitting: Surgery

## 2019-10-22 NOTE — Telephone Encounter (Signed)
ED CM received message from ED CSW that patient needs an OP Rehab arranged.  Referral faxed  to [facility] [phone].  The ofice will reach out to patient to schedule appointments.

## 2019-10-23 ENCOUNTER — Inpatient Hospital Stay (HOSPITAL_COMMUNITY)
Admission: EM | Admit: 2019-10-23 | Discharge: 2019-11-22 | DRG: 034 | Disposition: E | Payer: 59 | Attending: Neurology | Admitting: Neurology

## 2019-10-23 ENCOUNTER — Inpatient Hospital Stay (HOSPITAL_COMMUNITY): Payer: 59

## 2019-10-23 ENCOUNTER — Emergency Department (HOSPITAL_COMMUNITY): Payer: 59

## 2019-10-23 ENCOUNTER — Encounter (HOSPITAL_COMMUNITY): Payer: Self-pay | Admitting: Internal Medicine

## 2019-10-23 ENCOUNTER — Ambulatory Visit (INDEPENDENT_AMBULATORY_CARE_PROVIDER_SITE_OTHER): Payer: 59 | Admitting: *Deleted

## 2019-10-23 ENCOUNTER — Other Ambulatory Visit: Payer: Self-pay

## 2019-10-23 DIAGNOSIS — L8990 Pressure ulcer of unspecified site, unspecified stage: Secondary | ICD-10-CM | POA: Insufficient documentation

## 2019-10-23 DIAGNOSIS — N1831 Chronic kidney disease, stage 3a: Secondary | ICD-10-CM | POA: Diagnosis present

## 2019-10-23 DIAGNOSIS — K219 Gastro-esophageal reflux disease without esophagitis: Secondary | ICD-10-CM | POA: Diagnosis present

## 2019-10-23 DIAGNOSIS — Z6838 Body mass index (BMI) 38.0-38.9, adult: Secondary | ICD-10-CM

## 2019-10-23 DIAGNOSIS — Z83438 Family history of other disorder of lipoprotein metabolism and other lipidemia: Secondary | ICD-10-CM

## 2019-10-23 DIAGNOSIS — Z823 Family history of stroke: Secondary | ICD-10-CM

## 2019-10-23 DIAGNOSIS — I611 Nontraumatic intracerebral hemorrhage in hemisphere, cortical: Secondary | ICD-10-CM | POA: Diagnosis not present

## 2019-10-23 DIAGNOSIS — R270 Ataxia, unspecified: Secondary | ICD-10-CM | POA: Diagnosis present

## 2019-10-23 DIAGNOSIS — I6523 Occlusion and stenosis of bilateral carotid arteries: Secondary | ICD-10-CM

## 2019-10-23 DIAGNOSIS — Z20822 Contact with and (suspected) exposure to covid-19: Secondary | ICD-10-CM | POA: Diagnosis present

## 2019-10-23 DIAGNOSIS — H269 Unspecified cataract: Secondary | ICD-10-CM | POA: Diagnosis present

## 2019-10-23 DIAGNOSIS — Y838 Other surgical procedures as the cause of abnormal reaction of the patient, or of later complication, without mention of misadventure at the time of the procedure: Secondary | ICD-10-CM | POA: Diagnosis not present

## 2019-10-23 DIAGNOSIS — H5347 Heteronymous bilateral field defects: Secondary | ICD-10-CM | POA: Diagnosis present

## 2019-10-23 DIAGNOSIS — Z882 Allergy status to sulfonamides status: Secondary | ICD-10-CM

## 2019-10-23 DIAGNOSIS — F17210 Nicotine dependence, cigarettes, uncomplicated: Secondary | ICD-10-CM | POA: Diagnosis present

## 2019-10-23 DIAGNOSIS — E1169 Type 2 diabetes mellitus with other specified complication: Secondary | ICD-10-CM | POA: Diagnosis present

## 2019-10-23 DIAGNOSIS — K551 Chronic vascular disorders of intestine: Secondary | ICD-10-CM | POA: Diagnosis present

## 2019-10-23 DIAGNOSIS — N200 Calculus of kidney: Secondary | ICD-10-CM | POA: Diagnosis present

## 2019-10-23 DIAGNOSIS — E1165 Type 2 diabetes mellitus with hyperglycemia: Secondary | ICD-10-CM | POA: Diagnosis present

## 2019-10-23 DIAGNOSIS — I615 Nontraumatic intracerebral hemorrhage, intraventricular: Secondary | ICD-10-CM | POA: Diagnosis not present

## 2019-10-23 DIAGNOSIS — E1122 Type 2 diabetes mellitus with diabetic chronic kidney disease: Secondary | ICD-10-CM | POA: Diagnosis present

## 2019-10-23 DIAGNOSIS — R40241 Glasgow coma scale score 13-15, unspecified time: Secondary | ICD-10-CM | POA: Diagnosis not present

## 2019-10-23 DIAGNOSIS — R410 Disorientation, unspecified: Secondary | ICD-10-CM

## 2019-10-23 DIAGNOSIS — G510 Bell's palsy: Secondary | ICD-10-CM | POA: Diagnosis present

## 2019-10-23 DIAGNOSIS — M170 Bilateral primary osteoarthritis of knee: Secondary | ICD-10-CM | POA: Diagnosis present

## 2019-10-23 DIAGNOSIS — Z818 Family history of other mental and behavioral disorders: Secondary | ICD-10-CM

## 2019-10-23 DIAGNOSIS — E1151 Type 2 diabetes mellitus with diabetic peripheral angiopathy without gangrene: Secondary | ICD-10-CM | POA: Diagnosis present

## 2019-10-23 DIAGNOSIS — K529 Noninfective gastroenteritis and colitis, unspecified: Secondary | ICD-10-CM | POA: Diagnosis present

## 2019-10-23 DIAGNOSIS — G9349 Other encephalopathy: Secondary | ICD-10-CM | POA: Diagnosis present

## 2019-10-23 DIAGNOSIS — G932 Benign intracranial hypertension: Secondary | ICD-10-CM | POA: Diagnosis not present

## 2019-10-23 DIAGNOSIS — J9601 Acute respiratory failure with hypoxia: Secondary | ICD-10-CM | POA: Diagnosis not present

## 2019-10-23 DIAGNOSIS — R471 Dysarthria and anarthria: Secondary | ICD-10-CM | POA: Diagnosis present

## 2019-10-23 DIAGNOSIS — R001 Bradycardia, unspecified: Secondary | ICD-10-CM | POA: Diagnosis not present

## 2019-10-23 DIAGNOSIS — I9762 Postprocedural hemorrhage of a circulatory system organ or structure following other procedure: Secondary | ICD-10-CM | POA: Diagnosis not present

## 2019-10-23 DIAGNOSIS — Z66 Do not resuscitate: Secondary | ICD-10-CM | POA: Diagnosis not present

## 2019-10-23 DIAGNOSIS — I959 Hypotension, unspecified: Secondary | ICD-10-CM | POA: Diagnosis not present

## 2019-10-23 DIAGNOSIS — Z978 Presence of other specified devices: Secondary | ICD-10-CM

## 2019-10-23 DIAGNOSIS — Z808 Family history of malignant neoplasm of other organs or systems: Secondary | ICD-10-CM

## 2019-10-23 DIAGNOSIS — Z91048 Other nonmedicinal substance allergy status: Secondary | ICD-10-CM

## 2019-10-23 DIAGNOSIS — E785 Hyperlipidemia, unspecified: Secondary | ICD-10-CM | POA: Diagnosis present

## 2019-10-23 DIAGNOSIS — Z515 Encounter for palliative care: Secondary | ICD-10-CM | POA: Diagnosis not present

## 2019-10-23 DIAGNOSIS — N3001 Acute cystitis with hematuria: Secondary | ICD-10-CM

## 2019-10-23 DIAGNOSIS — I639 Cerebral infarction, unspecified: Principal | ICD-10-CM | POA: Diagnosis present

## 2019-10-23 DIAGNOSIS — E1136 Type 2 diabetes mellitus with diabetic cataract: Secondary | ICD-10-CM | POA: Diagnosis present

## 2019-10-23 DIAGNOSIS — F319 Bipolar disorder, unspecified: Secondary | ICD-10-CM | POA: Diagnosis present

## 2019-10-23 DIAGNOSIS — I6529 Occlusion and stenosis of unspecified carotid artery: Secondary | ICD-10-CM

## 2019-10-23 DIAGNOSIS — Z825 Family history of asthma and other chronic lower respiratory diseases: Secondary | ICD-10-CM

## 2019-10-23 DIAGNOSIS — E874 Mixed disorder of acid-base balance: Secondary | ICD-10-CM | POA: Diagnosis present

## 2019-10-23 DIAGNOSIS — R29711 NIHSS score 11: Secondary | ICD-10-CM | POA: Diagnosis present

## 2019-10-23 DIAGNOSIS — E669 Obesity, unspecified: Secondary | ICD-10-CM | POA: Diagnosis present

## 2019-10-23 DIAGNOSIS — I619 Nontraumatic intracerebral hemorrhage, unspecified: Secondary | ICD-10-CM | POA: Diagnosis present

## 2019-10-23 DIAGNOSIS — Z7902 Long term (current) use of antithrombotics/antiplatelets: Secondary | ICD-10-CM

## 2019-10-23 DIAGNOSIS — I69998 Other sequelae following unspecified cerebrovascular disease: Secondary | ICD-10-CM

## 2019-10-23 DIAGNOSIS — Z79899 Other long term (current) drug therapy: Secondary | ICD-10-CM

## 2019-10-23 DIAGNOSIS — G4733 Obstructive sleep apnea (adult) (pediatric): Secondary | ICD-10-CM | POA: Diagnosis present

## 2019-10-23 DIAGNOSIS — Z87440 Personal history of urinary (tract) infections: Secondary | ICD-10-CM

## 2019-10-23 DIAGNOSIS — I6310 Cerebral infarction due to embolism of unspecified precerebral artery: Secondary | ICD-10-CM

## 2019-10-23 DIAGNOSIS — Z811 Family history of alcohol abuse and dependence: Secondary | ICD-10-CM

## 2019-10-23 DIAGNOSIS — D7589 Other specified diseases of blood and blood-forming organs: Secondary | ICD-10-CM | POA: Diagnosis present

## 2019-10-23 DIAGNOSIS — Z91040 Latex allergy status: Secondary | ICD-10-CM

## 2019-10-23 DIAGNOSIS — F419 Anxiety disorder, unspecified: Secondary | ICD-10-CM | POA: Diagnosis present

## 2019-10-23 DIAGNOSIS — Z7982 Long term (current) use of aspirin: Secondary | ICD-10-CM

## 2019-10-23 DIAGNOSIS — I10 Essential (primary) hypertension: Secondary | ICD-10-CM

## 2019-10-23 DIAGNOSIS — N183 Chronic kidney disease, stage 3 unspecified: Secondary | ICD-10-CM | POA: Diagnosis present

## 2019-10-23 DIAGNOSIS — G8194 Hemiplegia, unspecified affecting left nondominant side: Secondary | ICD-10-CM | POA: Diagnosis present

## 2019-10-23 DIAGNOSIS — R778 Other specified abnormalities of plasma proteins: Secondary | ICD-10-CM | POA: Diagnosis present

## 2019-10-23 DIAGNOSIS — G43909 Migraine, unspecified, not intractable, without status migrainosus: Secondary | ICD-10-CM | POA: Diagnosis present

## 2019-10-23 DIAGNOSIS — Z833 Family history of diabetes mellitus: Secondary | ICD-10-CM

## 2019-10-23 DIAGNOSIS — Z8249 Family history of ischemic heart disease and other diseases of the circulatory system: Secondary | ICD-10-CM

## 2019-10-23 DIAGNOSIS — Z8619 Personal history of other infectious and parasitic diseases: Secondary | ICD-10-CM

## 2019-10-23 DIAGNOSIS — I129 Hypertensive chronic kidney disease with stage 1 through stage 4 chronic kidney disease, or unspecified chronic kidney disease: Secondary | ICD-10-CM | POA: Diagnosis present

## 2019-10-23 DIAGNOSIS — Z794 Long term (current) use of insulin: Secondary | ICD-10-CM

## 2019-10-23 DIAGNOSIS — R7989 Other specified abnormal findings of blood chemistry: Secondary | ICD-10-CM

## 2019-10-23 DIAGNOSIS — J988 Other specified respiratory disorders: Secondary | ICD-10-CM | POA: Diagnosis not present

## 2019-10-23 DIAGNOSIS — K5900 Constipation, unspecified: Secondary | ICD-10-CM | POA: Diagnosis not present

## 2019-10-23 LAB — DIFFERENTIAL
Abs Immature Granulocytes: 0.02 10*3/uL (ref 0.00–0.07)
Basophils Absolute: 0 10*3/uL (ref 0.0–0.1)
Basophils Relative: 0 %
Eosinophils Absolute: 0.1 10*3/uL (ref 0.0–0.5)
Eosinophils Relative: 1 %
Immature Granulocytes: 0 %
Lymphocytes Relative: 23 %
Lymphs Abs: 2.1 10*3/uL (ref 0.7–4.0)
Monocytes Absolute: 0.4 10*3/uL (ref 0.1–1.0)
Monocytes Relative: 4 %
Neutro Abs: 6.7 10*3/uL (ref 1.7–7.7)
Neutrophils Relative %: 72 %

## 2019-10-23 LAB — FOLATE: Folate: 51.7 ng/mL (ref >5.9)

## 2019-10-23 LAB — I-STAT CHEM 8, ED
BUN: 26 mg/dL — ABNORMAL HIGH (ref 6–20)
Calcium, Ion: 1.28 mmol/L (ref 1.15–1.40)
Chloride: 109 mmol/L (ref 98–111)
Creatinine, Ser: 1.2 mg/dL — ABNORMAL HIGH (ref 0.44–1.00)
Glucose, Bld: 196 mg/dL — ABNORMAL HIGH (ref 70–99)
HCT: 43 % (ref 36.0–46.0)
Hemoglobin: 14.6 g/dL (ref 12.0–15.0)
Potassium: 3.8 mmol/L (ref 3.5–5.1)
Sodium: 143 mmol/L (ref 135–145)
TCO2: 22 mmol/L (ref 22–32)

## 2019-10-23 LAB — COMPREHENSIVE METABOLIC PANEL
ALT: 19 U/L (ref 0–44)
AST: 37 U/L (ref 15–41)
Albumin: 3.5 g/dL (ref 3.5–5.0)
Alkaline Phosphatase: 114 U/L (ref 38–126)
Anion gap: 15 (ref 5–15)
BUN: 22 mg/dL — ABNORMAL HIGH (ref 6–20)
CO2: 18 mmol/L — ABNORMAL LOW (ref 22–32)
Calcium: 9.9 mg/dL (ref 8.9–10.3)
Chloride: 107 mmol/L (ref 98–111)
Creatinine, Ser: 1.21 mg/dL — ABNORMAL HIGH (ref 0.44–1.00)
GFR calc Af Amer: 60 mL/min — ABNORMAL LOW (ref >60)
GFR calc non Af Amer: 51 mL/min — ABNORMAL LOW (ref >60)
Glucose, Bld: 188 mg/dL — ABNORMAL HIGH (ref 70–99)
Potassium: 5.2 mmol/L — ABNORMAL HIGH (ref 3.5–5.1)
Sodium: 140 mmol/L (ref 135–145)
Total Bilirubin: 1.1 mg/dL (ref 0.3–1.2)
Total Protein: 6.8 g/dL (ref 6.5–8.1)

## 2019-10-23 LAB — RAPID URINE DRUG SCREEN, HOSP PERFORMED
Amphetamines: NOT DETECTED
Barbiturates: NOT DETECTED
Benzodiazepines: NOT DETECTED
Cocaine: NOT DETECTED
Opiates: NOT DETECTED
Tetrahydrocannabinol: NOT DETECTED

## 2019-10-23 LAB — CBC
HCT: 43.3 % (ref 36.0–46.0)
Hemoglobin: 13.5 g/dL (ref 12.0–15.0)
MCH: 31.6 pg (ref 26.0–34.0)
MCHC: 31.2 g/dL (ref 30.0–36.0)
MCV: 101.4 fL — ABNORMAL HIGH (ref 80.0–100.0)
Platelets: 123 10*3/uL — ABNORMAL LOW (ref 150–400)
RBC: 4.27 MIL/uL (ref 3.87–5.11)
RDW: 13.2 % (ref 11.5–15.5)
WBC: 9.2 10*3/uL (ref 4.0–10.5)
nRBC: 0 % (ref 0.0–0.2)

## 2019-10-23 LAB — URINALYSIS, ROUTINE W REFLEX MICROSCOPIC
Bilirubin Urine: NEGATIVE
Glucose, UA: NEGATIVE mg/dL
Ketones, ur: NEGATIVE mg/dL
Nitrite: POSITIVE — AB
Protein, ur: 30 mg/dL — AB
Specific Gravity, Urine: 1.025 (ref 1.005–1.030)
WBC, UA: >50 WBC/hpf — ABNORMAL HIGH (ref 0–5)
pH: 7 (ref 5.0–8.0)

## 2019-10-23 LAB — PROTIME-INR
INR: 1.1 (ref 0.8–1.2)
Prothrombin Time: 13.5 seconds (ref 11.4–15.2)

## 2019-10-23 LAB — I-STAT BETA HCG BLOOD, ED (MC, WL, AP ONLY): I-stat hCG, quantitative: <5 m[IU]/mL (ref <5)

## 2019-10-23 LAB — CBG MONITORING, ED: Glucose-Capillary: 156 mg/dL — ABNORMAL HIGH (ref 70–99)

## 2019-10-23 LAB — SARS CORONAVIRUS 2 BY RT PCR (HOSPITAL ORDER, PERFORMED IN ~~LOC~~ HOSPITAL LAB): SARS Coronavirus 2: NEGATIVE

## 2019-10-23 LAB — TROPONIN I (HIGH SENSITIVITY)
Troponin I (High Sensitivity): 174 ng/L (ref <18)
Troponin I (High Sensitivity): 167 ng/L (ref <18)

## 2019-10-23 LAB — APTT: aPTT: 31 seconds (ref 24–36)

## 2019-10-23 LAB — GLUCOSE, CAPILLARY: Glucose-Capillary: 177 mg/dL — ABNORMAL HIGH (ref 70–99)

## 2019-10-23 LAB — VITAMIN B12: Vitamin B-12: 235 pg/mL (ref 180–914)

## 2019-10-23 LAB — ETHANOL: Alcohol, Ethyl (B): <10 mg/dL (ref <10)

## 2019-10-23 IMAGING — MR MR HEAD W/O CM
10 of 11 series · 43 of 48 positions shown · non-contrast
Comparison: MRI head [DATE]

CLINICAL DATA: Stroke follow-up.

EXAM:
MRI HEAD WITHOUT CONTRAST
TECHNIQUE: Multiplanar, multiecho pulse sequences of the brain and surrounding
structures were obtained without intravenous contrast.

[Series 5: DWI · axial · 3.0mm · 0.88mm/px · z∈[-79,+70]mm · 10 of 104 slices shown (1 of 4)]
[im 1/104]
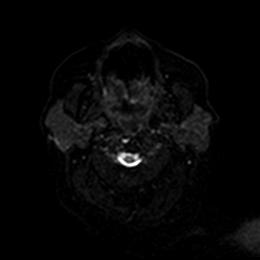
[im 12/104]
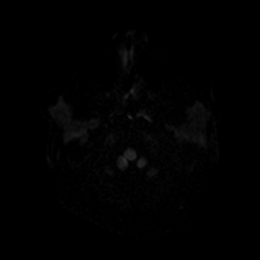
[im 23/104]
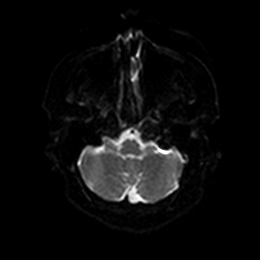
[im 35/104]
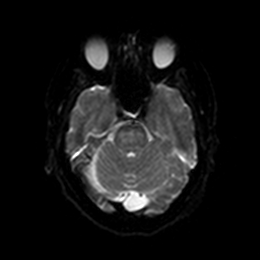
[im 46/104]
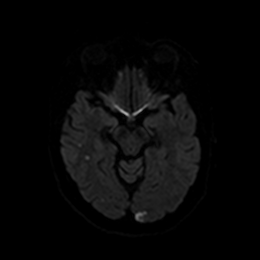
[im 58/104]
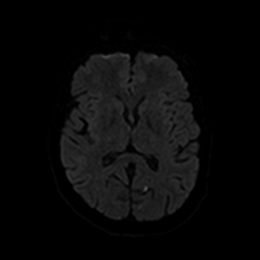
[im 69/104]
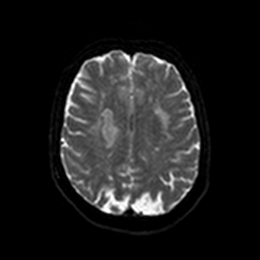
[im 81/104]
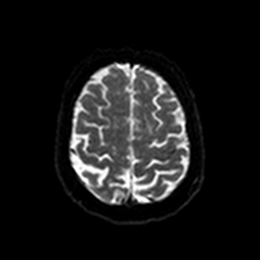
[im 92/104]
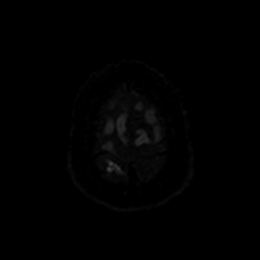
[im 104/104]
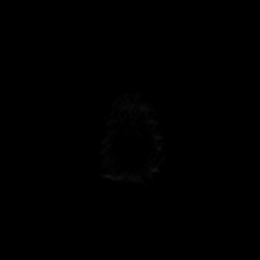

[Series 6: DWI · axial · 3.0mm · 0.88mm/px · z∈[-79,+70]mm · 5 of 51 slices shown (2 of 4)]
[im 1/51]
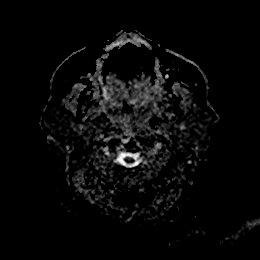
[im 13/51]
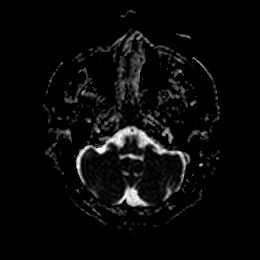
[im 26/51]
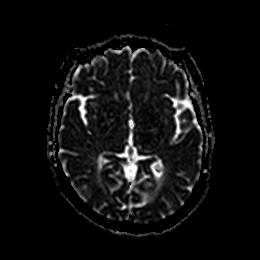
[im 38/51]
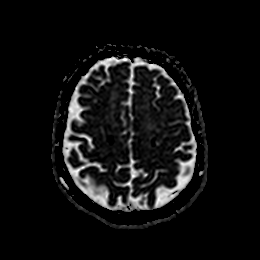
[im 51/51]
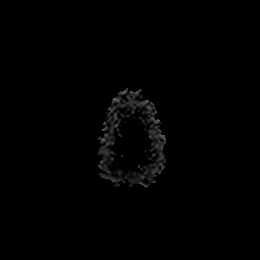

[Series 7: DWI · coronal · 4.0mm · 0.88mm/px · 6 of 68 slices shown (3 of 4)]
[im 1/68]
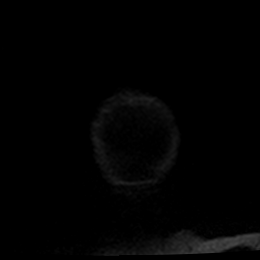
[im 14/68]
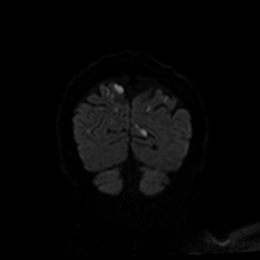
[im 27/68]
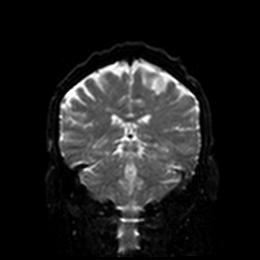
[im 41/68]
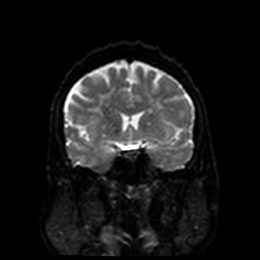
[im 54/68]
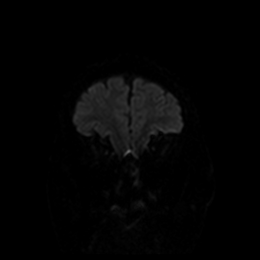
[im 68/68]
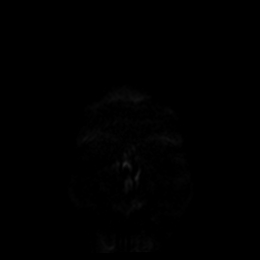

[Series 8: DWI · coronal · 4.0mm · 0.88mm/px · 3 of 34 slices shown (4 of 4)]
[im 1/34]
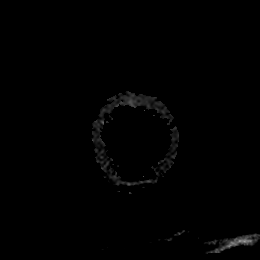
[im 17/34]
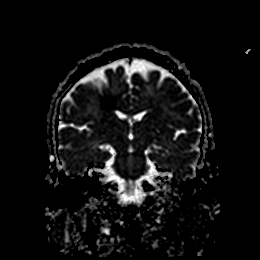
[im 34/34]
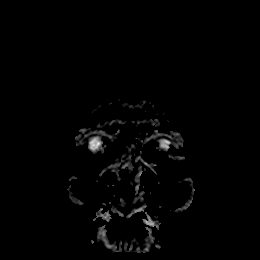

[Series 9: FLAIR · axial · 5.0mm · 0.45mm/px · z∈[-72,+68]mm · 2 of 25 slices shown]
[im 1/25]
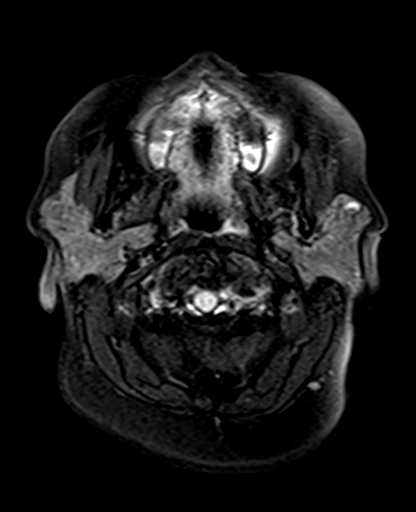
[im 25/25]
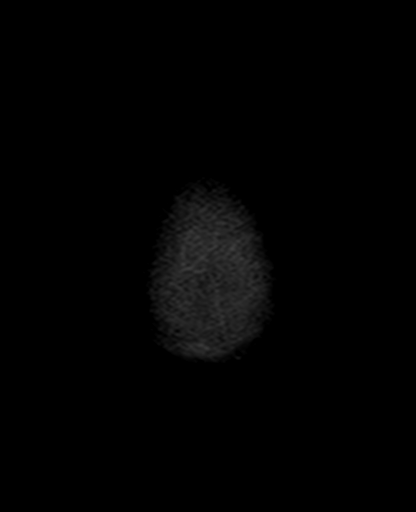

[Series 11: pha_images · axial · 3.0mm · 0.90mm/px · z∈[-76,+70]mm · 5 of 51 slices shown]
[im 1/51]
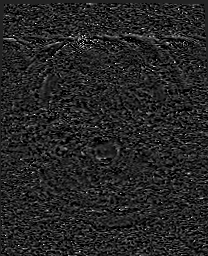
[im 13/51]
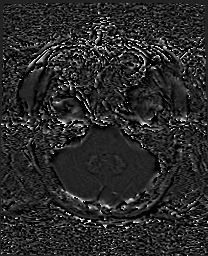
[im 26/51]
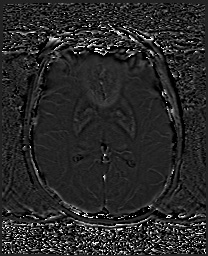
[im 38/51]
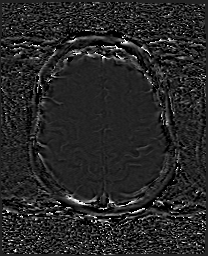
[im 51/51]
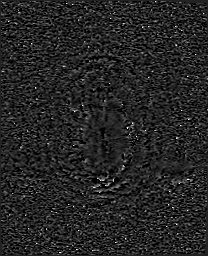

[Series 12: swi_images · axial · 3.0mm · 0.90mm/px · z∈[-76,+73]mm · 5 of 52 slices shown]
[im 1/52]
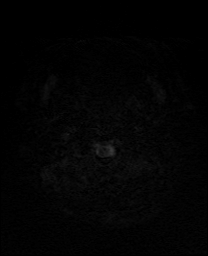
[im 13/52]
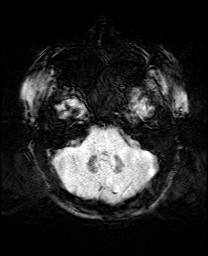
[im 26/52]
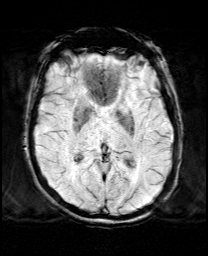
[im 39/52]
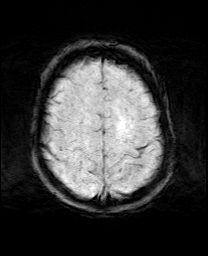
[im 52/52]
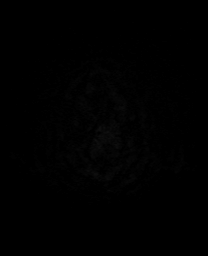

[Series 14: T1 · sagittal · 5.0mm · 0.75mm/px · 2 of 25 slices shown]
[im 1/25]
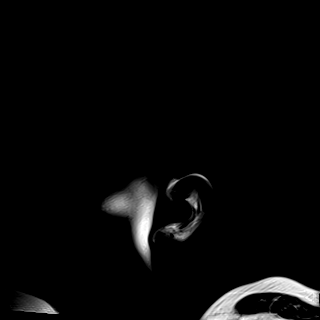
[im 25/25]
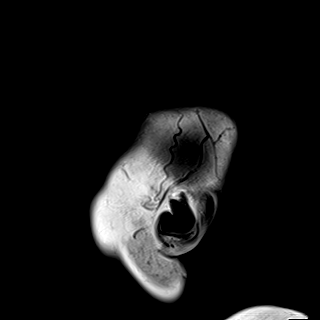

[Series 15: T2 · axial · 5.0mm · 0.72mm/px · z∈[-69,+70]mm · 2 of 25 slices shown (1 of 2)]
[im 1/25]
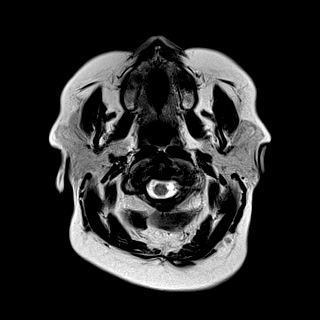
[im 25/25]
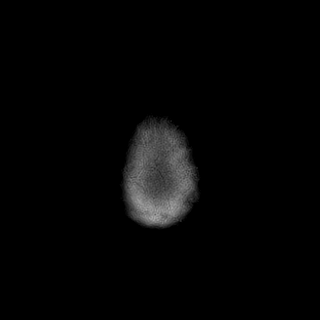

[Series 17: T2 · coronal · 5.0mm · 0.34mm/px · 3 of 29 slices shown (2 of 2)]
[im 1/29]
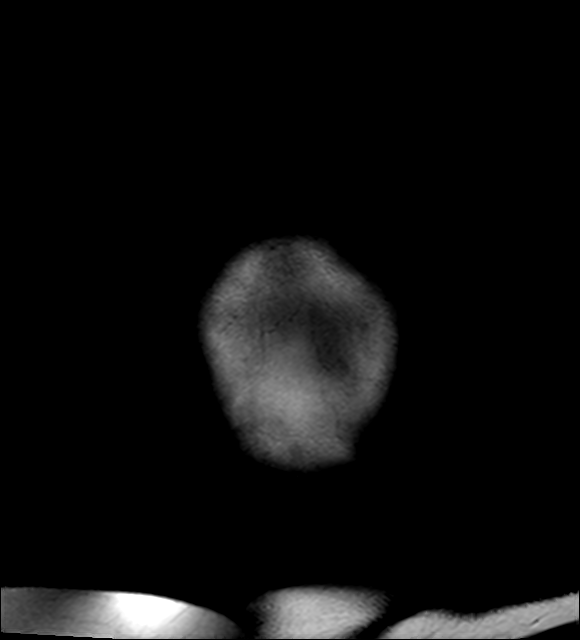
[im 15/29]
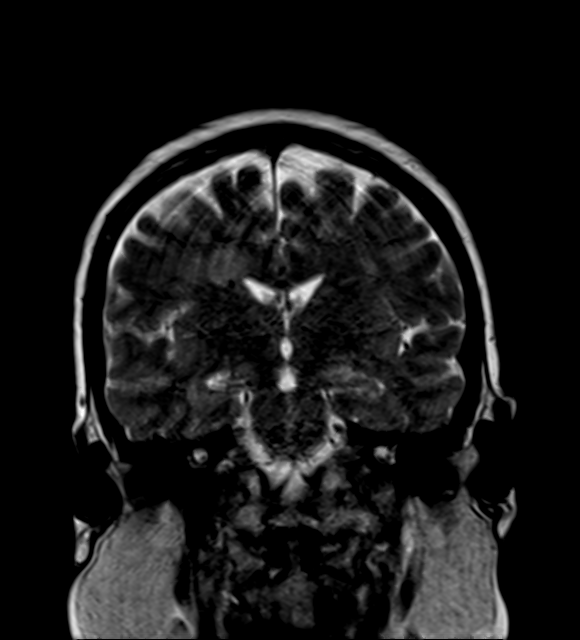
[im 29/29]
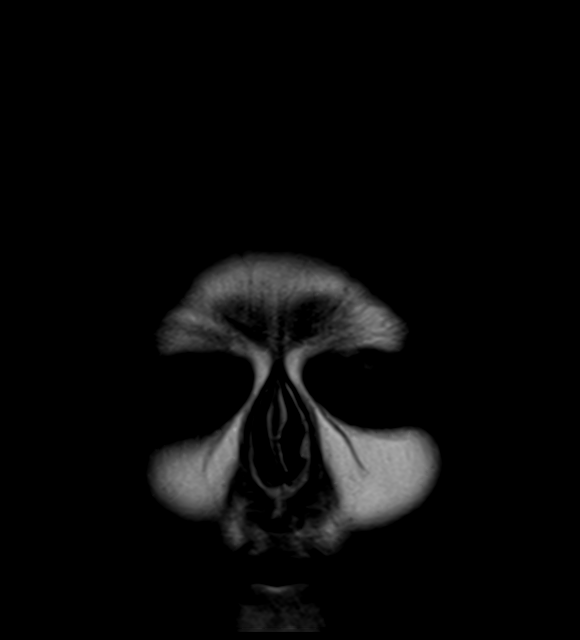

[43 of 48 positions shown; findings below may reference images not displayed]

FINDINGS: Brain: Multiple areas of acute infarct are present with progression
since 1 month ago. Bilateral watershed infarcts in the deep white
matter bilaterally have progressed and are more prominent the right
than the left. Chronic cortical infarct in the left occipital lobe
has progressed. Small chronic infarct right occipital lobe also with
progression. No acute infarct in the brainstem or cerebellum.

Ventricle size and cerebral volume normal. Chronic microvascular
ischemic changes in the white matter. Prominent chronic ischemic
changes for age in the pons bilaterally. Negative for hemorrhage or
mass.

Image quality degraded by motion on some sequences.

Vascular: Normal arterial flow voids.

Skull and upper cervical spine: No focal skeletal lesion.

Sinuses/Orbits: Paranasal sinuses clear.  Negative orbit

Other: None
IMPRESSION: Progression of acute infarcts compared with 1 month ago. Extensive
watershed infarction bilaterally right greater than left. Chronic
infarcts in the occipital lobes left greater than right also with
progression.

Chronic microvascular ischemic change in the white matter and pons,
relatively advanced for age.

## 2019-10-23 MED ORDER — CLOPIDOGREL BISULFATE 75 MG PO TABS
75.0000 mg | ORAL_TABLET | Freq: Every day | ORAL | Status: DC
  Administered 2019-10-23 – 2019-10-29 (×7): 75 mg via ORAL
  Filled 2019-10-23 (×7): qty 1

## 2019-10-23 MED ORDER — ENOXAPARIN SODIUM 40 MG/0.4ML ~~LOC~~ SOLN
40.0000 mg | SUBCUTANEOUS | Status: DC
  Administered 2019-10-24 – 2019-10-29 (×5): 40 mg via SUBCUTANEOUS
  Filled 2019-10-23 (×5): qty 0.4

## 2019-10-23 MED ORDER — ASPIRIN 81 MG PO CHEW
81.0000 mg | CHEWABLE_TABLET | Freq: Every day | ORAL | Status: DC
  Administered 2019-10-24 – 2019-10-29 (×6): 81 mg via ORAL
  Filled 2019-10-23 (×6): qty 1

## 2019-10-23 MED ORDER — TICAGRELOR 90 MG PO TABS: 90.0000 mg | ORAL_TABLET | Freq: Two times a day (BID) | ORAL | Status: DC

## 2019-10-23 MED ORDER — LACTATED RINGERS IV SOLN: INTRAVENOUS | Status: DC

## 2019-10-23 MED ORDER — IOHEXOL 350 MG/ML SOLN
100.0000 mL | Freq: Once | INTRAVENOUS | Status: AC | PRN
  Administered 2019-10-23: 100 mL via INTRAVENOUS

## 2019-10-23 MED ORDER — ATORVASTATIN CALCIUM 80 MG PO TABS
80.0000 mg | ORAL_TABLET | Freq: Every day | ORAL | Status: DC
  Administered 2019-10-24 – 2019-10-29 (×5): 80 mg via ORAL
  Filled 2019-10-23 (×5): qty 1

## 2019-10-23 MED ORDER — INSULIN ASPART 100 UNIT/ML ~~LOC~~ SOLN
0.0000 [IU] | SUBCUTANEOUS | Status: DC
  Administered 2019-10-23: 2 [IU] via SUBCUTANEOUS
  Administered 2019-10-23: 3 [IU] via SUBCUTANEOUS
  Administered 2019-10-24 (×3): 2 [IU] via SUBCUTANEOUS

## 2019-10-23 MED ORDER — SODIUM CHLORIDE 0.9 % IV BOLUS
1000.0000 mL | Freq: Once | INTRAVENOUS | Status: AC
  Administered 2019-10-23: 1000 mL via INTRAVENOUS

## 2019-10-23 MED ORDER — SODIUM CHLORIDE 0.9 % IV SOLN
1.0000 g | Freq: Once | INTRAVENOUS | Status: AC
  Administered 2019-10-23: 1 g via INTRAVENOUS
  Filled 2019-10-23: qty 10

## 2019-10-23 NOTE — H&P (View-Only) (Signed)
[facility] NOTE   MRN : [id number]  Reason for Consult: B ICA stenosis  Referring Physician: ED  History of Present Illness: 54 y/o female She was seen 4 days earlier with stroke like symptoms and no focal weakness.  She has left UE weakness today and family members stated she was "not acting right."     CTA done today shows: Severe atherosclerosis with right worse than left cerebral watershed ischemia by CT perfusion.  Critical bilateral proximal ICA narrowings, progressed from 2020.   60% right ICA petrous segment narrowing  She was admitted on 09/23/2019 and seen by Dr. Carlis Abbott.  MRI brain then showed patchy small volume acute ischemic nonhemorrhagic infarcts in the cortical subcortical hemispheres bilaterally most pronounced posteriorly.  Dr. Carlis Abbott did not think the carotid disease was symptomatic and arranged out patient f/u.  Prior to that Dr. Donzetta Matters her in Oct. 2020 for symptomatic carotid stenosis with left occipital stroke on MRI.  CTA at that time showed showed right ICA stenosis of 70 to 80% and left ICA stenosis of 60%.  She is on aspirin and a statin.  She is also on Brilinta.  This was started for her carotid disease.  Past medical history hypertension, hyperlipidemia, diabetes, CKD, and Chronic mesenteric ischemia.     Current Facility-Administered Medications  Medication Dose Route Frequency Provider Last Rate Last Admin  . aspirin chewable tablet 81 mg  81 mg Oral Daily Agyei, Obed K, MD      . atorvastatin (LIPITOR) tablet 80 mg  80 mg Oral q1800 Agyei, Obed K, MD      . cefTRIAXone (ROCEPHIN) 1 g in sodium chloride 0.9 % 100 mL IVPB  1 g Intravenous Once Agyei, Caprice Kluver, MD      . Derrill Memo ON 10/24/2019] enoxaparin (LOVENOX) injection 40 mg  40 mg Subcutaneous Q24H Agyei, Obed K, MD      . insulin aspart (novoLOG) injection 0-15 Units  0-15 Units Subcutaneous Q4H Agyei, Obed K, MD      . lactated ringers infusion   Intravenous Continuous Jean Rosenthal, MD 100 mL/hr at 11/13/2019 1313 New Bag at 10/26/2019 1313  . ticagrelor (BRILINTA) tablet 90 mg  90 mg Oral BID Jean Rosenthal, MD       Current Outpatient Medications  Medication Sig Dispense Refill  . aspirin 81 MG chewable tablet Chew 1 tablet (81 mg total) by mouth daily. 90 tablet 0  . atorvastatin (LIPITOR) 80 MG tablet Take 1 tablet (80 mg total) by mouth daily at 6 PM. 90 tablet 2  . AZO CRANBERRY GUMMIES PO Take 2 tablets by mouth daily.    . Blood Glucose Monitoring Suppl (ONETOUCH VERIO) w/Device KIT Use to check blood sugars every morning fasting and 2 hours after largest meal (Patient taking differently: 1 each by Other route See admin instructions. Use to check blood sugars every morning fasting and 2 hours after largest meal) 1 kit 0  . cetirizine (ZYRTEC) 10 MG tablet Take 1 tablet (10 mg total) by mouth daily. 90 tablet 2  . DULoxetine (CYMBALTA) 60 MG capsule TAKE 1 CAPSULE BY MOUTH DAILY (Patient taking differently: Take 60 mg by mouth daily. ) 90 capsule 1  . fluconazole (DIFLUCAN) 150 MG tablet Take 150 mg by mouth once.    . fluticasone (FLONASE) 50 MCG/ACT nasal spray Place 1 spray into both nostrils daily. (Patient not taking: Reported on 08/30/2019) 16 g 2  . glucose blood (ONETOUCH  VERIO) test strip Use to check blood sugars every morning fasting and 2 hours after largest meal (Patient taking differently: 1 each by Other route See admin instructions. Use to check blood sugars every morning fasting and 2 hours after largest meal) 200 each 3  . ibuprofen (ADVIL) 200 MG tablet Take 200 mg by mouth every 6 (six) hours as needed for moderate pain.    Marland Kitchen insulin glargine (LANTUS) 100 UNIT/ML Solostar Pen Inject 15 Units into the skin at bedtime. (Patient taking differently: Inject 23 Units into the skin at bedtime. ) 15 mL prn  . Insulin Pen Needle (PEN NEEDLES) 31G X 8 MM MISC Use as instructed. Inject into the skin once nightly. (Patient taking differently: 1 each by Other  route See admin instructions. Use as instructed. Inject into the skin once nightly.) 100 each 3  . ketoconazole (NIZORAL) 2 % cream Apply 1 application topically 2 (two) times daily. 15 g 2  . Multiple Vitamins-Minerals (ONE-A-DAY WOMENS 50+ ADVANTAGE) TABS Take 1 tablet by mouth daily.    . ondansetron (ZOFRAN) 4 MG tablet Take 1 tablet (4 mg total) by mouth every 8 (eight) hours as needed for nausea or vomiting. 20 tablet 1  . ONE TOUCH LANCETS MISC Use to check blood sugars every morning fasting and 2 hours after largest meal (Patient taking differently: 1 each by Other route See admin instructions. Use to check blood sugars every morning fasting and 2 hours after largest meal) 200 each 3  . pregabalin (LYRICA) 75 MG capsule Take 1 capsule (75 mg total) by mouth 2 (two) times daily. 60 capsule 3  . ticagrelor (BRILINTA) 90 MG TABS tablet Take 1 tablet (90 mg total) by mouth 2 (two) times daily. 60 tablet 0  . topiramate (TOPAMAX) 100 MG tablet Take 1 tablet (100 mg total) by mouth 2 (two) times daily. (Patient taking differently: Take 50 mg by mouth 2 (two) times daily. ) 180 tablet 3    Pt meds include: Statin :Yes Betablocker: No ASA: Yes Other anticoagulants/antiplatelets: Brilinta  Past Medical History:  Diagnosis Date  . Abnormal Pap smear   . AKI (acute kidney injury) ([place]) 12/28/2017  . Anxiety   . Arthritis    bil knees, neck  . Bipolar 1 disorder ([place])   . Cataract    Mild  . Cholelithiasis 12/23/2017   noted on CT renal, pt unaware  . Chronic kidney disease (CKD), stage III (moderate)   . Cystitis   . Depression   . Diabetes mellitus without complication ([place])    type 2  . Genital warts    Hx of genital  . GERD (gastroesophageal reflux disease)   . Heart murmur    childhood  . Hematuria   . History of blood transfusion   . History of ectopic pregnancy   . History of gestational diabetes   . History of kidney stones   . History of sepsis    after ectopic  pregnancy  . Hyperlipidemia   . Hypertension   . Idiopathic peripheral neuropathy    both feet  . Leg ulcer, left ([place])   . Low back pain   . Migraines   . Obesity   . Oral candidiasis   . Pneumonia   . PONV (postoperative nausea and vomiting)    prolonged sedation  . Recurrent UTI   . 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 Sigmoid  diverticulosis 12/23/2017   noted on CT renal, pt unaware  . Sleep apnea    uses cpap  . Stroke [facility])    Cryptogenic  . Ulcer of foot ([place])    Left  . Weakness     Past Surgical History:  Procedure Laterality Date  . BUBBLE STUDY  12/23/2018   Procedure: BUBBLE STUDY;  Surgeon: Lelon Perla, MD;  Location: [facility] ENDOSCOPY;  Service: Cardiovascular;;  . CERVICAL CONE BIOPSY    . CESAREAN SECTION     x3  . CYSTOSCOPY W/ URETERAL STENT PLACEMENT Right 12/15/2018   Procedure: Cystoscopy, right retrograde ureteropyelogram, fluoroscopic interpretation, right double-J stent placement (24 cm x 6 [place]);  Surgeon: Franchot Gallo, MD;  Location: [place];  Service: Urology;  Laterality: Right;  . CYSTOSCOPY WITH RETROGRADE PYELOGRAM, URETEROSCOPY AND STENT PLACEMENT Right 01/27/2019   Procedure: CYSTOSCOPY WITH RETROGRADE PYELOGRAM, URETEROSCOPY AND STENT PLACEMENT; BASKET STONE RETRIEVAL;  Surgeon: Alexis Frock, MD;  Location: [facility];  Service: Urology;  Laterality: Right;  75 MINS  . CYSTOSCOPY/URETEROSCOPY/HOLMIUM LASER/STENT PLACEMENT Right 02/04/2018   Procedure: CYSTOSCOPY/URETEROSCOPY/RETROGRADE PYELOGRAM/HOLMIUM LASER/STENT PLACEMENT;  Surgeon: Alexis Frock, MD;  Location: [facility];  Service: Urology;  Laterality: Right;  . CYSTOSCOPY/URETEROSCOPY/HOLMIUM LASER/STENT PLACEMENT Right 02/07/2018   Procedure: CYSTOSCOPY LEFT STENT EXCHANGE;  Surgeon: Alexis Frock, MD;  Location: [facility];  Service: Urology;  Laterality: Right;  . DILATION AND CURETTAGE OF  UTERUS    . ECTOPIC PREGNANCY SURGERY    . IR NEPHROSTOMY PLACEMENT LEFT  12/25/2017  . LOOP RECORDER INSERTION N/A 12/23/2018   Procedure: LOOP RECORDER INSERTION;  Surgeon: Constance Haw, MD;  Location: [facility];  Service: Cardiovascular;  Laterality: N/A;  . NEPHROLITHOTOMY Left 02/04/2018   Procedure: NEPHROLITHOTOMY PERCUTANEOUS;  Surgeon: Alexis Frock, MD;  Location: [facility];  Service: Urology;  Laterality: Left;  3 HRS  . NEPHROLITHOTOMY Left 02/07/2018   Procedure: SECOND LOOK NEPHROLITHOTOMY PERCUTANEOUS;  Surgeon: Alexis Frock, MD;  Location: [facility];  Service: Urology;  Laterality: Left;  2 HRS  . OVARIAN CYST REMOVAL    . TEE WITHOUT CARDIOVERSION N/A 12/23/2018   Procedure: TRANSESOPHAGEAL ECHOCARDIOGRAM (TEE);  Surgeon: Lelon Perla, MD;  Location: [facility] ENDOSCOPY;  Service: Cardiovascular;  Laterality: N/A;  . UNILATERAL SALPINGECTOMY     Pt unsure of which fallopian tube was removed  . WISDOM TOOTH EXTRACTION      Social History Social History   Tobacco Use  . Smoking status: Current Every Day Smoker    Packs/day: 1.00    Years: 37.00    Pack years: 37.00    Types: Cigarettes  . Smokeless tobacco: Never Used  Vaping Use  . Vaping Use: Never used  Substance Use Topics  . Alcohol use: No    Alcohol/week: 0.0 standard drinks  . Drug use: No    Family History Family History  Problem Relation Age of Onset  . Diabetes Paternal Grandfather   . COPD Paternal Grandfather   . Heart disease Paternal Grandfather   . Cancer Paternal Grandfather        bone  . Cancer Paternal Grandmother   . Heart disease Paternal Grandmother   . Cancer Father   . Hypertension Father   . Heart attack Father   . Alcohol abuse Father   . Depression Father   . Heart failure Mother   . Diabetes Mother   . Heart disease Mother   . Hyperlipidemia Mother   . Hypertension Mother   . Heart attack  Mother   . Peripheral vascular disease Mother   . Depression Mother    . COPD Mother   . Hypertension Sister   . Heart attack Sister   . Stroke Maternal Grandmother   . Colon cancer Neg Hx   . Colon polyps Neg Hx   . Esophageal cancer Neg Hx   . Rectal cancer Neg Hx   . Stomach cancer Neg Hx     Allergies  Allergen Reactions  . Sulfa Antibiotics Hives and Swelling    Swelling site not recalled  . Latex Rash  . Tape Rash    Not tolerated well     REVIEW OF SYSTEMS  General: [ ]  Weight loss, [ ]  Fever, [ ]  chills Neurologic: [ ]  Dizziness, [ ]  Blackouts, [ ]  Seizure [ ]  Stroke, [ ]  "Mini stroke", [ ]  Slurred speech, [ ]  Temporary blindness; [ ]  weakness in arms or legs, [ ]  Hoarseness [ ]  Dysphagia Cardiac: [ ]  Chest pain/pressure, [ ]  Shortness of breath at rest [ ]  Shortness of breath with exertion, [ ]  Atrial fibrillation or irregular heartbeat  Vascular: [ ]  Pain in legs with walking, [ ]  Pain in legs at rest, [ ]  Pain in legs at night,  [ ]  Non-healing ulcer, [ ]  Blood clot in vein/DVT,   Pulmonary: [ ]  Home oxygen, [ ]  Productive cough, [ ]  Coughing up blood, [ ]  Asthma,  [ ]  Wheezing [ ]  COPD Musculoskeletal:  [ ]  Arthritis, [ ]  Low back pain, [ ]  Joint pain Hematologic: [ ]  Easy Bruising, [ ]  Anemia; [ ]  Hepatitis Gastrointestinal: [ ]  Blood in stool, [ ]  Gastroesophageal Reflux/heartburn, Urinary: [ ]  chronic Kidney disease, [ ]  on HD - [ ]  MWF or [ ]  TTHS, [ ]  Burning with urination, [ ]  Difficulty urinating Skin: [ ]  Rashes, [ ]  Wounds Psychological: [ ]  Anxiety, [ ]  Depression  Physical Examination Vitals:   11/09/2019 1144 11/10/2019 1230 10/26/2019 1245 11/05/2019 1315  BP:  (!) 167/73 (!) 164/91 (!) 156/107  Pulse: 74 63 66 80  Resp: 16 13 15 16   Temp:      TempSrc:      SpO2: 95% 98% 96% 97%   There is no height or weight on file to calculate BMI.  General:  WDWN in NAD HENT: WNL Eyes: Pupils equal Pulmonary: normal non-labored breathing , without Rales, rhonchi,  wheezing Cardiac: RRR, without  Murmurs, rubs or gallops; No  carotid bruits Abdomen: soft, NT, no masses Skin: no rashes, ulcers noted;  no Gangrene , no cellulitis; no open wounds;   Vascular Exam/Pulses:Palpable radial, femoral pulses B , No pedal pulses, but feet are warm without non healing ulcers.   Musculoskeletal: no muscle wasting or atrophy; no edema.    Neurologic: A&O X 3; Appropriate Affect ;  SENSATION: normal; MOTOR FUNCTION: 5/5 Symmetric, left UE hand and arm weakness.   Speech is fluent/normal, confused.   Significant Diagnostic Studies: CBC Lab Results  Component Value Date   WBC 9.2 10/24/2019   HGB 14.6 10/25/2019   HCT 43.0 11/18/2019   MCV 101.4 (H) 10/27/2019   PLT 123 (L) 11/17/2019    BMET    Component Value Date/Time   NA 143 11/17/2019 1020   NA 142 08/28/2019 1441   K 3.8 11/09/2019 1020   CL 109 11/01/2019 1020   CO2 18 (L) 11/02/2019 1009   GLUCOSE 196 (H) 11/19/2019 1020   BUN 26 (H) 10/28/2019 1020   BUN  29 (H) 08/28/2019 1441   CREATININE 1.20 (H) 11/12/2019 1020   CALCIUM 9.9 10/27/2019 1009   GFRNONAA 51 (L) 11/19/2019 1009   GFRAA 60 (L) 10/22/2019 1009   Estimated Creatinine Clearance: 59.2 mL/min (A) (by C-G formula based on SCr of 1.2 mg/dL (H)).  COAG Lab Results  Component Value Date   INR 1.1 11/16/2019   INR SPECIMEN CLOTTED 11/06/2019   INR 1.0 10/21/2019     Non-Invasive Vascular Imaging:  Carotid duplex 09/23/19 Right Carotid: Velocities in the right ICA are consistent with a 1-39% stenosis. Left Carotid: Velocities in the left ICA are consistent with a 80-99% stenosis.  EXAM: CT ANGIOGRAPHY HEAD AND NECK  CT PERFUSION BRAIN  TECHNIQUE: Multidetector CT imaging of the head and neck was performed using the standard protocol during bolus administration of intravenous contrast. Multiplanar CT image reconstructions and MIPs were obtained to evaluate the vascular anatomy. Carotid stenosis measurements (when applicable) are obtained utilizing NASCET criteria, using the  distal internal carotid diameter as the denominator.  Multiphase CT imaging of the brain was performed following IV bolus contrast injection. Subsequent parametric perfusion maps were calculated using RAPID software.  CONTRAST:  Dose is currently not known  COMPARISON:  Brain MRI and MRA 09/23/2019. Noncontrast head CT from 2 days ago. Neck CTA 12/21/2018  FINDINGS: CT HEAD FINDINGS  Brain: Occipital and frontal parietal cortical and white matter infarct by recent brain MRI. There may have been progression from previous allowing for cross modality differences. No hemorrhage, hydrocephalus, or masslike finding  Vascular: Negative  Skull: No acute or aggressive finding  Sinuses/Orbits: Negative  Other: Discussed with Dr. Leonel Ramsay in person  CTA NECK FINDINGS  Aortic arch: Heavily calcified aorta with 3 vessel branching. No aneurysm or dissection.  Right carotid system: Diffuse atheromatous wall thickening of the brachiocephalic and common carotid arteries. Severe proximal ICA stenosis, ~ 3 cm beyond the bifurcation and just below the right mandibular angle. No superimposed dissection or ulceration.  Left carotid system: Diffuse atheromatous wall thickening of the common carotid. Proximal ICA stenosis with positive remodeling and low-density plaque at the bulb. Critical stenosis at the distal bulb with non measurable enhancing lumen. No superimposed ulceration or dissection is seen.  Vertebral arteries: Atheromatous bilateral subclavian artery. Left subclavian origin stenosis measures 60%. At least moderate narrowing of the left vertebral artery at the origin, quantification limited by small vessel size and obliquity. Milder right V1 segment stenosis.  Skeleton: Extensive dental erosions. Multilevel cervical disc narrowing and protrusion.  Other neck: No evidence of mass or inflammation  Upper chest: Mosaic attenuation of the apical lungs likely  related to smoking.  Review of the MIP images confirms the above findings  CTA HEAD FINDINGS  Anterior circulation: Heavily calcified carotid siphons with 60% narrowing at the petrous segment on the right. Slightly less intense enhancement of the right ICA like related to the stenosis in the neck. No major branch occlusion; diffuse atheromatous irregularity of M3 and M4 branches, worse on the right based on MIPS. Worsened, very diminutive right A1 segment.  Posterior circulation: Severe left vertebral stenosis beyond the patent PICA. Diminutive basilar due to hypoplastic P1 segments, with mild narrowing. Atheromatous irregularity of the right more than left PCA with diffuse attenuation on the right, unchanged.  Venous sinuses: Unremarkable in the arterial phase  Anatomic variants: Phase as above  Review of the MIP images confirms the above findings  CT Brain Perfusion Findings:  CBF (<30%) Volume: 85m  Perfusion (Tmax>6.0s) volume:  175m and roughly located along the bilateral cerebral watershed territories, worse on the right.  Mismatch Volume: 1157m IMPRESSION: 1. No emergent vascular finding. 2. Recent watershed type infarcts by brain MRI. There may have been progression since 09/23/2019. 3. Severe atherosclerosis with right worse than left cerebral watershed ischemia by CT perfusion. 4. Critical bilateral proximal ICA narrowings, progressed from 2020. 60% right ICA petrous segment narrowing. 5. Chronic severe left V4 segment stenosis with minimal contribution to the basilar. 6. Advanced intracranial branch atheromatous irregularity. There is severe right A1 segment narrowing which is new from 2020 7. 60% proximal left subclavian stenosis. Moderate left vertebral origin   ASSESSMENT/PLAN:  ICA stenosis B carotids with new stroke symptom of left UE weakness.    She appears confused and not a good historian.  No family present.  The CTA gives the  impression of progressive  Of  right worse than left cerebral watershed ischemia.    She was on Brillinta for medical management.  Her previous work up was negative for A fib and workup included 2D echo without cardiac source of embolism, negative for PFO, loop recorder.    She does have CTA changes and left UE weakness.  I will discuss this with Dr. FiOneida Alar This is not a clear cut symptomatic carotid stenosis.     EmRoxy Horseman/0/07/[birth year]77:4:80M  Complicated course over last few months.  At this point she now has right gaze preference and some confusion/memory loss.  She also has some weakness in her right upper extremity with lack of wrist extension and elbow extension.  She may also have some left leg weakness but more subtle.  She has bilateral >80% ICA stenosis and has failed medical management on 2 occasions now.  The lesions are both fairly high so we will evaluate for TCAR stenting right followed by left.  Most likely this hospitalization depending on her recovery.  I will switch her back to plavix for now in anticipation of TCAR as bleeding risk from brilinta would be higher periprocedure.  Will follow to see how she recovers over next few days  ChRuta HindsMD [facility] [place]: [phone]

## 2019-10-23 NOTE — Plan of Care (Signed)
  Problem: Education: Goal: Knowledge of General Education information will improve Description: Including pain rating scale, medication(s)/side effects and non-pharmacologic comfort measures Outcome: Progressing   Problem: Health Behavior/Discharge Planning: Goal: Ability to manage health-related needs will improve Outcome: Progressing   Problem: Clinical Measurements: Goal: Ability to maintain clinical measurements within normal limits will improve Outcome: Progressing Goal: Will remain free from infection Outcome: Progressing Goal: Diagnostic test results will improve Outcome: Progressing Goal: Respiratory complications will improve Outcome: Progressing Goal: Cardiovascular complication will be avoided Outcome: Progressing   Problem: Activity: Goal: Risk for activity intolerance will decrease Outcome: Progressing   Problem: Nutrition: Goal: Adequate nutrition will be maintained Outcome: Progressing   Problem: Coping: Goal: Level of anxiety will decrease Outcome: Progressing   Problem: Elimination: Goal: Will not experience complications related to bowel motility Outcome: Progressing Goal: Will not experience complications related to urinary retention Outcome: Progressing   Problem: Pain Managment: Goal: General experience of comfort will improve Outcome: Progressing   Problem: Safety: Goal: Ability to remain free from injury will improve Outcome: Progressing   Problem: Skin Integrity: Goal: Risk for impaired skin integrity will decrease Outcome: Progressing   Problem: Education: Goal: Knowledge of disease or condition will improve Outcome: Progressing Goal: Knowledge of secondary prevention will improve Outcome: Progressing   Problem: Coping: Goal: Will verbalize positive feelings about self Outcome: Progressing Goal: Will identify appropriate support needs Outcome: Progressing

## 2019-10-23 NOTE — ED Notes (Signed)
Neurologist PA at bedside

## 2019-10-23 NOTE — ED Triage Notes (Signed)
Per EMS pt's family sts that pt was d/c from the hospital 2 days ago after having a stroke, and the family sts that ever since the patient was brought home she has L sided weakness, is only oriented to her name, unable to perform ADLs. Pt continues with same symptoms as two days ago, no change since then.

## 2019-10-23 NOTE — H&P (Addendum)
Date: 11/10/2019               Patient Name:  Pamela Carney MRN: [id number]  DOB: [birth year] Age / Sex: 54 y.o., female   PCP: Pamela Pounds, NP         Medical Service: Internal Medicine Teaching Service         Attending Physician: Dr. Jimmye Carney    First Contact: Dr. Coy Carney  Pager: [phone]  Second Contact: Pamela Distance, DO, [place] Pager: Pamela Carney [phone])       After Hours (After 5p/  First Contact Pager: [phone]  weekends / holidays): Second Contact Pager: [phone]   Chief Complaint: Left-sided weakness, altered mental status  History of Present Illness:  *History obtained from chart review, ED provider and spouse Pamela Carney)*  [name] is a 54 year old woman with medical history significant for prior cryptogenic stroke (failed aspirin and Plavix), diabetes mellitus, hyperlipidemia, CKD stage III, hypertension, bipolar 1 disorder, chronic mesenteric ischemia who presented to the hospital with worsening left-sided weakness.  Upon speaking to patient's spouse Pamela Carney), he made me aware that his wife was completely fine, ambulating and completing or ADLs until Friday night when she began experiencing generalized weakness, left-sided weakness which prompted him to bring her to the emergency department.  At the emergency department, CT head without contrast performed revealed chronic ischemic changes without acute findings.  Given that the ED provider thought her work-up was unrevealing, she was subsequently discharged from the ED to follow-up with her PCP and vascular surgery.  Her husband makes me aware that she has not improved since discharge from the ED.  [name] is unable to cooperate with assessment though she is alert and oriented x3 (name, place, reason for admission) however she continues to make nonsensical speech which limits interview questionnaire.  Of note, she was admitted to the hospital from September 23, 2019 to September 25, 2019 for which she was found to have multifocal  ischemic changes on MRI thought to be secondary to watershed infarcts.  There were also consideration for possible embolic source however loop recorder did not reveal any cardiac abnormalities.  She was subsequently discharged on aspirin and Brilinta.  During that admission, she was also found to have severe left-sided ICA stenosis at 80-99% however these were thought to be unlikely to cause for her generalized weakness.    Lab Orders     Urine culture     SARS Coronavirus 2 by RT PCR (hospital order, performed in [facility] hospital lab) Nasopharyngeal Nasopharyngeal Swab     Ethanol     Protime-INR     CBC     Differential     Comprehensive metabolic panel     Urine rapid drug screen (hosp performed)     Urinalysis, Routine w reflex microscopic     APTT     Protime-INR     CBC     Comprehensive metabolic panel     Vitamin B12     Folate, serum, performed at [facility] lab     I-stat chem 8, ED     I-Stat beta hCG blood, ED   Meds:  No current facility-administered medications on file prior to encounter.   Current Outpatient Medications on File Prior to Encounter  Medication Sig  . aspirin 81 MG chewable tablet Chew 1 tablet (81 mg total) by mouth daily.  Marland Kitchen atorvastatin (LIPITOR) 80 MG tablet Take 1 tablet (80 mg total) by mouth daily at 6 PM.  .  AZO CRANBERRY GUMMIES PO Take 2 tablets by mouth daily.  . Blood Glucose Monitoring Suppl (ONETOUCH VERIO) w/Device KIT Use to check blood sugars every morning fasting and 2 hours after largest meal (Patient taking differently: 1 each by Other route See admin instructions. Use to check blood sugars every morning fasting and 2 hours after largest meal)  . cetirizine (ZYRTEC) 10 MG tablet Take 1 tablet (10 mg total) by mouth daily.  . DULoxetine (CYMBALTA) 60 MG capsule TAKE 1 CAPSULE BY MOUTH DAILY (Patient taking differently: Take 60 mg by mouth daily. )  . fluconazole (DIFLUCAN) 150 MG tablet Take 150 mg by mouth once.  . fluticasone  (FLONASE) 50 MCG/ACT nasal spray Place 1 spray into both nostrils daily. (Patient not taking: Reported on 08/30/2019)  . glucose blood (ONETOUCH VERIO) test strip Use to check blood sugars every morning fasting and 2 hours after largest meal (Patient taking differently: 1 each by Other route See admin instructions. Use to check blood sugars every morning fasting and 2 hours after largest meal)  . ibuprofen (ADVIL) 200 MG tablet Take 200 mg by mouth every 6 (six) hours as needed for moderate pain.  Marland Kitchen insulin glargine (LANTUS) 100 UNIT/ML Solostar Pen Inject 15 Units into the skin at bedtime. (Patient taking differently: Inject 23 Units into the skin at bedtime. )  . Insulin Pen Needle (PEN NEEDLES) 31G X 8 MM MISC Use as instructed. Inject into the skin once nightly. (Patient taking differently: 1 each by Other route See admin instructions. Use as instructed. Inject into the skin once nightly.)  . ketoconazole (NIZORAL) 2 % cream Apply 1 application topically 2 (two) times daily.  . Multiple Vitamins-Minerals (ONE-A-DAY WOMENS 50+ ADVANTAGE) TABS Take 1 tablet by mouth daily.  . ondansetron (ZOFRAN) 4 MG tablet Take 1 tablet (4 mg total) by mouth every 8 (eight) hours as needed for nausea or vomiting.  . ONE TOUCH LANCETS MISC Use to check blood sugars every morning fasting and 2 hours after largest meal (Patient taking differently: 1 each by Other route See admin instructions. Use to check blood sugars every morning fasting and 2 hours after largest meal)  . pregabalin (LYRICA) 75 MG capsule Take 1 capsule (75 mg total) by mouth 2 (two) times daily.  . ticagrelor (BRILINTA) 90 MG TABS tablet Take 1 tablet (90 mg total) by mouth 2 (two) times daily.  Marland Kitchen topiramate (TOPAMAX) 100 MG tablet Take 1 tablet (100 mg total) by mouth 2 (two) times daily. (Patient taking differently: Take 50 mg by mouth 2 (two) times daily. )     Allergies: Allergies as of 11/02/2019 - Review Complete 11/06/2019  Allergen  Reaction Noted  . Sulfa antibiotics Hives and Swelling 12/27/2012  . Latex Rash 12/23/2017  . Tape Rash 12/23/2017   Past Medical History:  Diagnosis Date  . Abnormal Pap smear   . AKI (acute kidney injury) ([place]) 12/28/2017  . Anxiety   . Arthritis    bil knees, neck  . Bipolar 1 disorder ([place])   . Cataract    Mild  . Cholelithiasis 12/23/2017   noted on CT renal, pt unaware  . Chronic kidney disease (CKD), stage III (moderate)   . Cystitis   . Depression   . Diabetes mellitus without complication ([place])    type 2  . Genital warts    Hx of genital  . GERD (gastroesophageal reflux disease)   . Heart murmur    childhood  . Hematuria   .  History of blood transfusion   . History of ectopic pregnancy   . History of gestational diabetes   . History of kidney stones   . History of sepsis    after ectopic pregnancy  . Hyperlipidemia   . Hypertension   . Idiopathic peripheral neuropathy    both feet  . Leg ulcer, left ([place])   . Low back pain   . Migraines   . Obesity   . Oral candidiasis   . Pneumonia   . PONV (postoperative nausea and vomiting)    prolonged sedation  . Recurrent UTI   . 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 Sigmoid diverticulosis 12/23/2017   noted on CT renal, pt unaware  . Sleep apnea    uses cpap  . Stroke [facility])    Cryptogenic  . Ulcer of foot ([place])    Left  . Weakness     Family History: Unable to appropriately assess given patient's altered cognition  Social History: Unable to properly assess due to patient's altered cognition  Review of Systems: A complete ROS was negative except as per HPI.   Physical Exam: Blood pressure (!) 148/107, pulse 86, temperature 98.1 F (36.7 C), temperature source Oral, resp. rate 19, SpO2 91 %. Physical Exam Vitals and nursing note reviewed.  Constitutional:      General: She is not in acute distress.     Comments: Alert and oriented to name, place and reason for admission but intermittently repeats interview questionaire  HENT:     Head: Normocephalic and atraumatic.     Mouth/Throat:     Mouth: Mucous membranes are moist.  Cardiovascular:     Rate and Rhythm: Normal rate and regular rhythm.     Heart sounds: Normal heart sounds. No murmur heard.   Pulmonary:     Effort: Pulmonary effort is normal. No respiratory distress.     Breath sounds: No wheezing, rhonchi or rales.     Comments: Anteriorly Abdominal:     General: Bowel sounds are normal.     Tenderness: There is no abdominal tenderness.  Musculoskeletal:        General: No tenderness or deformity.     Cervical back: Normal range of motion.     Right lower leg: No edema.     Left lower leg: No edema.  Skin:    General: Skin is warm.  Neurological:     Motor: Weakness present.  Psychiatric:     Comments: Altered Cognition    Neurologic exam: (MOST OF NEURO EXAM LIMITED BY ALTERED COGNITION) Mental status: A&Ox3 Cranial Nerves: II: PERRL V, VII: Face symmetric, sensation intact in all 3 divisions  XI: Head turn and shoulder shrug normal bilaterally  Motor:  -Left upper extremity: 2/5 -Left lower extremity: 4/5 -Right upper extremity: 4/5 -Right lower extremity: 4/5 Sensory: Light touch intact and symmetric bilaterally (regurgitate "I think so")   EKG: personally reviewed my interpretation is TWI in lead III, V5, V6.  Old compared to EKG on 7/3  CXR: None  Assessment & Plan by Problem: Principal Problem:   Symptomatic carotid artery stenosis Active Problems:   CKD (chronic kidney disease), stage III   Type 2 diabetes mellitus with other specified complication (HCC)   Bipolar 1 disorder (HCC)   Obesity   Chronic mesenteric ischemia ([place])   [name] is a 54 year old woman with medical history significant for prior cryptogenic  stroke (failed aspirin and Plavix), diabetes  mellitus, hyperlipidemia, CKD stage III, hypertension, bipolar 1 disorder, chronic mesenteric ischemia here for management of generalized weakness   #Left-sided weakness #Symptomatic bilateral carotid stenosis #Watershed infarct #Acute encephalopathy  Worsening left-sided weakness x4 days with acute encephalopathy.  CT angiography head and neck, cerebral perfusion reviews progression of watershed infarcts in the brain, severe arthrosclerosis with R>>L cerebral watershed ischemia, critical bilateral proximal ICA narrowing, chronic CVA left V4 segment stenosis. Unsure if patient had an episode of transient hypotension at home that might've proceeded her onset of symptoms.  There is no clear signs of an infectious etiology though urinalysis shows pyuria, bacteriuria and positive leukocytes, she remains afebrile without leukocytosis.  There is no metabolic etiology to her encephalopathy either. -Follow-up neurology recommendation -Follow-up with vascular surgery recommendation -Frequent neuro checks -Hold antihypertensives to allow cerebral perfusion -Continue aspirin, Lipitor, Brilinta -Allow for permissive HTN (systolic < 536 and diastolic < 144) -Hold obtaining Echocardiogram as recent Echo on 7/3 was unremarkable  -Carotid doppler or CTA head & neck  -Tele monitoring  -SLP eval -PT/OT   #Chronic mesenteric ischemia #NAGMA History of chronic diarrhea found to have chronic mesenteric ischemia on CT abdomen and pelvis during her recent admission. -Continur IVF to avoid hypotension -Follow-up GI outpatient   #Hypertension -Avoid antihypertensives    #Type II ischemia Troponin of 174, EKG without ischemic changes. -Follow-up repeat troponin   #Type 2 diabetes mellitus -SSI every 4 hour in anticipation for possible vascular intervention   #Hyperlipidemia -Continue Lipitor    #Chronic UTI #Recurrent kidney stones  She has had persistent pyuria, bacteriuria, leukocyte since  2012 1 or her urine studies.  She remains asymptomatic.  I wonder if she has vertical antral fistula that could be contributing to her chronically positive urine findings.  She was started on ceftriaxone in the ED but will err on the side of discontinuation.  Urine is alkalotic. CT renal stone 2019 showed multiple bilateral nonobstructing 2.2 cm lower pole partial staghorn on the left -s/p ceftriaxone x 1 dose -Follow-up urine culture -Might warrant urology evaluation for cystoscopy   #CKD stage III-stable sCr 1.2 (baseline 1.4) -Continue to monitor   #Non-anion gap metabolic acidosis Serum bicarb 18.  Likely secondary to chronic diarrhea -Continue to monitor -Daily BMP   #Macrocytosis Vitamin B12 in September 2020 was 454 -Follow-up vitamin R15, folic acid   FEN: N.p.o., LR VTE ppx: Lovenox CODE STATUS: Full code  Prior to Admission Living Arrangement: Home Anticipated Discharge Location: Pending Barriers to Discharge: Ongoing medical work-up  Dispo: Admit patient to Observation with expected length of stay less than 2 midnights.  Signed: Jean Rosenthal, MD 10/29/2019, 2:16 PM  Pager: [phone] Internal Medicine Teaching Service After 5pm on weekdays and 1pm on weekends: On Call pager: [phone]

## 2019-10-23 NOTE — Consult Note (Addendum)
[facility] NOTE   MRN : [id number]  Reason for Consult: B ICA stenosis  Referring Physician: ED  History of Present Illness: 54 y/o female She was seen 4 days earlier with stroke like symptoms and no focal weakness.  She has left UE weakness today and family members stated she was "not acting right."     CTA done today shows: Severe atherosclerosis with right worse than left cerebral watershed ischemia by CT perfusion.  Critical bilateral proximal ICA narrowings, progressed from 2020.   60% right ICA petrous segment narrowing  She was admitted on 09/23/2019 and seen by Dr. Carlis Abbott.  MRI brain then showed patchy small volume acute ischemic nonhemorrhagic infarcts in the cortical subcortical hemispheres bilaterally most pronounced posteriorly.  Dr. Carlis Abbott did not think the carotid disease was symptomatic and arranged out patient f/u.  Prior to that Dr. Donzetta Matters her in Oct. 2020 for symptomatic carotid stenosis with left occipital stroke on MRI.  CTA at that time showed showed right ICA stenosis of 70 to 80% and left ICA stenosis of 60%.  She is on aspirin and a statin.  She is also on Brilinta.  This was started for her carotid disease.  Past medical history hypertension, hyperlipidemia, diabetes, CKD, and Chronic mesenteric ischemia.     Current Facility-Administered Medications  Medication Dose Route Frequency Provider Last Rate Last Admin  . aspirin chewable tablet 81 mg  81 mg Oral Daily Agyei, Obed K, MD      . atorvastatin (LIPITOR) tablet 80 mg  80 mg Oral q1800 Agyei, Obed K, MD      . cefTRIAXone (ROCEPHIN) 1 g in sodium chloride 0.9 % 100 mL IVPB  1 g Intravenous Once Agyei, Caprice Kluver, MD      . Derrill Memo ON 10/24/2019] enoxaparin (LOVENOX) injection 40 mg  40 mg Subcutaneous Q24H Agyei, Obed K, MD      . insulin aspart (novoLOG) injection 0-15 Units  0-15 Units Subcutaneous Q4H Agyei, Obed K, MD      . lactated ringers infusion   Intravenous Continuous Jean Rosenthal, MD 100 mL/hr at 11/17/2019 1313 New Bag at 10/25/2019 1313  . ticagrelor (BRILINTA) tablet 90 mg  90 mg Oral BID Jean Rosenthal, MD       Current Outpatient Medications  Medication Sig Dispense Refill  . aspirin 81 MG chewable tablet Chew 1 tablet (81 mg total) by mouth daily. 90 tablet 0  . atorvastatin (LIPITOR) 80 MG tablet Take 1 tablet (80 mg total) by mouth daily at 6 PM. 90 tablet 2  . AZO CRANBERRY GUMMIES PO Take 2 tablets by mouth daily.    . Blood Glucose Monitoring Suppl (ONETOUCH VERIO) w/Device KIT Use to check blood sugars every morning fasting and 2 hours after largest meal (Patient taking differently: 1 each by Other route See admin instructions. Use to check blood sugars every morning fasting and 2 hours after largest meal) 1 kit 0  . cetirizine (ZYRTEC) 10 MG tablet Take 1 tablet (10 mg total) by mouth daily. 90 tablet 2  . DULoxetine (CYMBALTA) 60 MG capsule TAKE 1 CAPSULE BY MOUTH DAILY (Patient taking differently: Take 60 mg by mouth daily. ) 90 capsule 1  . fluconazole (DIFLUCAN) 150 MG tablet Take 150 mg by mouth once.    . fluticasone (FLONASE) 50 MCG/ACT nasal spray Place 1 spray into both nostrils daily. (Patient not taking: Reported on 08/30/2019) 16 g 2  . glucose blood (ONETOUCH  VERIO) test strip Use to check blood sugars every morning fasting and 2 hours after largest meal (Patient taking differently: 1 each by Other route See admin instructions. Use to check blood sugars every morning fasting and 2 hours after largest meal) 200 each 3  . ibuprofen (ADVIL) 200 MG tablet Take 200 mg by mouth every 6 (six) hours as needed for moderate pain.    Marland Kitchen insulin glargine (LANTUS) 100 UNIT/ML Solostar Pen Inject 15 Units into the skin at bedtime. (Patient taking differently: Inject 23 Units into the skin at bedtime. ) 15 mL prn  . Insulin Pen Needle (PEN NEEDLES) 31G X 8 MM MISC Use as instructed. Inject into the skin once nightly. (Patient taking differently: 1 each by Other  route See admin instructions. Use as instructed. Inject into the skin once nightly.) 100 each 3  . ketoconazole (NIZORAL) 2 % cream Apply 1 application topically 2 (two) times daily. 15 g 2  . Multiple Vitamins-Minerals (ONE-A-DAY WOMENS 50+ ADVANTAGE) TABS Take 1 tablet by mouth daily.    . ondansetron (ZOFRAN) 4 MG tablet Take 1 tablet (4 mg total) by mouth every 8 (eight) hours as needed for nausea or vomiting. 20 tablet 1  . ONE TOUCH LANCETS MISC Use to check blood sugars every morning fasting and 2 hours after largest meal (Patient taking differently: 1 each by Other route See admin instructions. Use to check blood sugars every morning fasting and 2 hours after largest meal) 200 each 3  . pregabalin (LYRICA) 75 MG capsule Take 1 capsule (75 mg total) by mouth 2 (two) times daily. 60 capsule 3  . ticagrelor (BRILINTA) 90 MG TABS tablet Take 1 tablet (90 mg total) by mouth 2 (two) times daily. 60 tablet 0  . topiramate (TOPAMAX) 100 MG tablet Take 1 tablet (100 mg total) by mouth 2 (two) times daily. (Patient taking differently: Take 50 mg by mouth 2 (two) times daily. ) 180 tablet 3    Pt meds include: Statin :Yes Betablocker: No ASA: Yes Other anticoagulants/antiplatelets: Brilinta  Past Medical History:  Diagnosis Date  . Abnormal Pap smear   . AKI (acute kidney injury) ([place]) 12/28/2017  . Anxiety   . Arthritis    bil knees, neck  . Bipolar 1 disorder ([place])   . Cataract    Mild  . Cholelithiasis 12/23/2017   noted on CT renal, pt unaware  . Chronic kidney disease (CKD), stage III (moderate)   . Cystitis   . Depression   . Diabetes mellitus without complication ([place])    type 2  . Genital warts    Hx of genital  . GERD (gastroesophageal reflux disease)   . Heart murmur    childhood  . Hematuria   . History of blood transfusion   . History of ectopic pregnancy   . History of gestational diabetes   . History of kidney stones   . History of sepsis    after ectopic  pregnancy  . Hyperlipidemia   . Hypertension   . Idiopathic peripheral neuropathy    both feet  . Leg ulcer, left ([place])   . Low back pain   . Migraines   . Obesity   . Oral candidiasis   . Pneumonia   . PONV (postoperative nausea and vomiting)    prolonged sedation  . Recurrent UTI   . 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 Sigmoid  diverticulosis 12/23/2017   noted on CT renal, pt unaware  . Sleep apnea    uses cpap  . Stroke [facility])    Cryptogenic  . Ulcer of foot ([place])    Left  . Weakness     Past Surgical History:  Procedure Laterality Date  . BUBBLE STUDY  12/23/2018   Procedure: BUBBLE STUDY;  Surgeon: Lelon Perla, MD;  Location: [facility] ENDOSCOPY;  Service: Cardiovascular;;  . CERVICAL CONE BIOPSY    . CESAREAN SECTION     x3  . CYSTOSCOPY W/ URETERAL STENT PLACEMENT Right 12/15/2018   Procedure: Cystoscopy, right retrograde ureteropyelogram, fluoroscopic interpretation, right double-J stent placement (24 cm x 6 [place]);  Surgeon: Franchot Gallo, MD;  Location: [facility];  Service: Urology;  Laterality: Right;  . CYSTOSCOPY WITH RETROGRADE PYELOGRAM, URETEROSCOPY AND STENT PLACEMENT Right 01/27/2019   Procedure: CYSTOSCOPY WITH RETROGRADE PYELOGRAM, URETEROSCOPY AND STENT PLACEMENT; BASKET STONE RETRIEVAL;  Surgeon: Alexis Frock, MD;  Location: [facility];  Service: Urology;  Laterality: Right;  75 MINS  . CYSTOSCOPY/URETEROSCOPY/HOLMIUM LASER/STENT PLACEMENT Right 02/04/2018   Procedure: CYSTOSCOPY/URETEROSCOPY/RETROGRADE PYELOGRAM/HOLMIUM LASER/STENT PLACEMENT;  Surgeon: Alexis Frock, MD;  Location: [facility];  Service: Urology;  Laterality: Right;  . CYSTOSCOPY/URETEROSCOPY/HOLMIUM LASER/STENT PLACEMENT Right 02/07/2018   Procedure: CYSTOSCOPY LEFT STENT EXCHANGE;  Surgeon: Alexis Frock, MD;  Location: [facility];  Service: Urology;  Laterality: Right;  . DILATION AND CURETTAGE OF  UTERUS    . ECTOPIC PREGNANCY SURGERY    . IR NEPHROSTOMY PLACEMENT LEFT  12/25/2017  . LOOP RECORDER INSERTION N/A 12/23/2018   Procedure: LOOP RECORDER INSERTION;  Surgeon: Constance Haw, MD;  Location: [place] [facility];  Service: Cardiovascular;  Laterality: N/A;  . NEPHROLITHOTOMY Left 02/04/2018   Procedure: NEPHROLITHOTOMY PERCUTANEOUS;  Surgeon: Alexis Frock, MD;  Location: [facility];  Service: Urology;  Laterality: Left;  3 HRS  . NEPHROLITHOTOMY Left 02/07/2018   Procedure: SECOND LOOK NEPHROLITHOTOMY PERCUTANEOUS;  Surgeon: Alexis Frock, MD;  Location: [facility];  Service: Urology;  Laterality: Left;  2 HRS  . OVARIAN CYST REMOVAL    . TEE WITHOUT CARDIOVERSION N/A 12/23/2018   Procedure: TRANSESOPHAGEAL ECHOCARDIOGRAM (TEE);  Surgeon: Lelon Perla, MD;  Location: [facility] ENDOSCOPY;  Service: Cardiovascular;  Laterality: N/A;  . UNILATERAL SALPINGECTOMY     Pt unsure of which fallopian tube was removed  . WISDOM TOOTH EXTRACTION      Social History Social History   Tobacco Use  . Smoking status: Current Every Day Smoker    Packs/day: 1.00    Years: 37.00    Pack years: 37.00    Types: Cigarettes  . Smokeless tobacco: Never Used  Vaping Use  . Vaping Use: Never used  Substance Use Topics  . Alcohol use: No    Alcohol/week: 0.0 standard drinks  . Drug use: No    Family History Family History  Problem Relation Age of Onset  . Diabetes Paternal Grandfather   . COPD Paternal Grandfather   . Heart disease Paternal Grandfather   . Cancer Paternal Grandfather        bone  . Cancer Paternal Grandmother   . Heart disease Paternal Grandmother   . Cancer Father   . Hypertension Father   . Heart attack Father   . Alcohol abuse Father   . Depression Father   . Heart failure Mother   . Diabetes Mother   . Heart disease Mother   . Hyperlipidemia Mother   . Hypertension Mother   . Heart attack  Mother   . Peripheral vascular disease Mother   . Depression Mother    . COPD Mother   . Hypertension Sister   . Heart attack Sister   . Stroke Maternal Grandmother   . Colon cancer Neg Hx   . Colon polyps Neg Hx   . Esophageal cancer Neg Hx   . Rectal cancer Neg Hx   . Stomach cancer Neg Hx     Allergies  Allergen Reactions  . Sulfa Antibiotics Hives and Swelling    Swelling site not recalled  . Latex Rash  . Tape Rash    Not tolerated well     REVIEW OF SYSTEMS  General: [ ]  Weight loss, [ ]  Fever, [ ]  chills Neurologic: [ ]  Dizziness, [ ]  Blackouts, [ ]  Seizure [ ]  Stroke, [ ]  "Mini stroke", [ ]  Slurred speech, [ ]  Temporary blindness; [ ]  weakness in arms or legs, [ ]  Hoarseness [ ]  Dysphagia Cardiac: [ ]  Chest pain/pressure, [ ]  Shortness of breath at rest [ ]  Shortness of breath with exertion, [ ]  Atrial fibrillation or irregular heartbeat  Vascular: [ ]  Pain in legs with walking, [ ]  Pain in legs at rest, [ ]  Pain in legs at night,  [ ]  Non-healing ulcer, [ ]  Blood clot in vein/DVT,   Pulmonary: [ ]  Home oxygen, [ ]  Productive cough, [ ]  Coughing up blood, [ ]  Asthma,  [ ]  Wheezing [ ]  COPD Musculoskeletal:  [ ]  Arthritis, [ ]  Low back pain, [ ]  Joint pain Hematologic: [ ]  Easy Bruising, [ ]  Anemia; [ ]  Hepatitis Gastrointestinal: [ ]  Blood in stool, [ ]  Gastroesophageal Reflux/heartburn, Urinary: [ ]  chronic Kidney disease, [ ]  on HD - [ ]  MWF or [ ]  TTHS, [ ]  Burning with urination, [ ]  Difficulty urinating Skin: [ ]  Rashes, [ ]  Wounds Psychological: [ ]  Anxiety, [ ]  Depression  Physical Examination Vitals:   11/14/2019 1144 11/18/2019 1230 11/16/2019 1245 11/21/2019 1315  BP:  (!) 167/73 (!) 164/91 (!) 156/107  Pulse: 74 63 66 80  Resp: 16 13 15 16   Temp:      TempSrc:      SpO2: 95% 98% 96% 97%   There is no height or weight on file to calculate BMI.  General:  WDWN in NAD HENT: WNL Eyes: Pupils equal Pulmonary: normal non-labored breathing , without Rales, rhonchi,  wheezing Cardiac: RRR, without  Murmurs, rubs or gallops; No  carotid bruits Abdomen: soft, NT, no masses Skin: no rashes, ulcers noted;  no Gangrene , no cellulitis; no open wounds;   Vascular Exam/Pulses:Palpable radial, femoral pulses B , No pedal pulses, but feet are warm without non healing ulcers.   Musculoskeletal: no muscle wasting or atrophy; no edema.    Neurologic: A&O X 3; Appropriate Affect ;  SENSATION: normal; MOTOR FUNCTION: 5/5 Symmetric, left UE hand and arm weakness.   Speech is fluent/normal, confused.   Significant Diagnostic Studies: CBC Lab Results  Component Value Date   WBC 9.2 11/13/2019   HGB 14.6 11/12/2019   HCT 43.0 11/01/2019   MCV 101.4 (H) 10/29/2019   PLT 123 (L) 10/26/2019    BMET    Component Value Date/Time   NA 143 10/27/2019 1020   NA 142 08/28/2019 1441   K 3.8 11/20/2019 1020   CL 109 10/25/2019 1020   CO2 18 (L) 11/07/2019 1009   GLUCOSE 196 (H) 11/21/2019 1020   BUN 26 (H) 11/03/2019 1020   BUN  29 (H) 08/28/2019 1441   CREATININE 1.20 (H) 11/18/2019 1020   CALCIUM 9.9 11/01/2019 1009   GFRNONAA 51 (L) 11/15/2019 1009   GFRAA 60 (L) 11/06/2019 1009   Estimated Creatinine Clearance: 59.2 mL/min (A) (by C-G formula based on SCr of 1.2 mg/dL (H)).  COAG Lab Results  Component Value Date   INR 1.1 10/26/2019   INR SPECIMEN CLOTTED 11/18/2019   INR 1.0 10/21/2019     Non-Invasive Vascular Imaging:  Carotid duplex 09/23/19 Right Carotid: Velocities in the right ICA are consistent with a 1-39% stenosis. Left Carotid: Velocities in the left ICA are consistent with a 80-99% stenosis.  EXAM: CT ANGIOGRAPHY HEAD AND NECK  CT PERFUSION BRAIN  TECHNIQUE: Multidetector CT imaging of the head and neck was performed using the standard protocol during bolus administration of intravenous contrast. Multiplanar CT image reconstructions and MIPs were obtained to evaluate the vascular anatomy. Carotid stenosis measurements (when applicable) are obtained utilizing NASCET criteria, using the  distal internal carotid diameter as the denominator.  Multiphase CT imaging of the brain was performed following IV bolus contrast injection. Subsequent parametric perfusion maps were calculated using RAPID software.  CONTRAST:  Dose is currently not known  COMPARISON:  Brain MRI and MRA 09/23/2019. Noncontrast head CT from 2 days ago. Neck CTA 12/21/2018  FINDINGS: CT HEAD FINDINGS  Brain: Occipital and frontal parietal cortical and white matter infarct by recent brain MRI. There may have been progression from previous allowing for cross modality differences. No hemorrhage, hydrocephalus, or masslike finding  Vascular: Negative  Skull: No acute or aggressive finding  Sinuses/Orbits: Negative  Other: Discussed with Dr. Leonel Ramsay in person  CTA NECK FINDINGS  Aortic arch: Heavily calcified aorta with 3 vessel branching. No aneurysm or dissection.  Right carotid system: Diffuse atheromatous wall thickening of the brachiocephalic and common carotid arteries. Severe proximal ICA stenosis, ~ 3 cm beyond the bifurcation and just below the right mandibular angle. No superimposed dissection or ulceration.  Left carotid system: Diffuse atheromatous wall thickening of the common carotid. Proximal ICA stenosis with positive remodeling and low-density plaque at the bulb. Critical stenosis at the distal bulb with non measurable enhancing lumen. No superimposed ulceration or dissection is seen.  Vertebral arteries: Atheromatous bilateral subclavian artery. Left subclavian origin stenosis measures 60%. At least moderate narrowing of the left vertebral artery at the origin, quantification limited by small vessel size and obliquity. Milder right V1 segment stenosis.  Skeleton: Extensive dental erosions. Multilevel cervical disc narrowing and protrusion.  Other neck: No evidence of mass or inflammation  Upper chest: Mosaic attenuation of the apical lungs likely  related to smoking.  Review of the MIP images confirms the above findings  CTA HEAD FINDINGS  Anterior circulation: Heavily calcified carotid siphons with 60% narrowing at the petrous segment on the right. Slightly less intense enhancement of the right ICA like related to the stenosis in the neck. No major branch occlusion; diffuse atheromatous irregularity of M3 and M4 branches, worse on the right based on MIPS. Worsened, very diminutive right A1 segment.  Posterior circulation: Severe left vertebral stenosis beyond the patent PICA. Diminutive basilar due to hypoplastic P1 segments, with mild narrowing. Atheromatous irregularity of the right more than left PCA with diffuse attenuation on the right, unchanged.  Venous sinuses: Unremarkable in the arterial phase  Anatomic variants: Phase as above  Review of the MIP images confirms the above findings  CT Brain Perfusion Findings:  CBF (<30%) Volume: 37m  Perfusion (Tmax>6.0s) volume:  153m and roughly located along the bilateral cerebral watershed territories, worse on the right.  Mismatch Volume: 1122m IMPRESSION: 1. No emergent vascular finding. 2. Recent watershed type infarcts by brain MRI. There may have been progression since 09/23/2019. 3. Severe atherosclerosis with right worse than left cerebral watershed ischemia by CT perfusion. 4. Critical bilateral proximal ICA narrowings, progressed from 2020. 60% right ICA petrous segment narrowing. 5. Chronic severe left V4 segment stenosis with minimal contribution to the basilar. 6. Advanced intracranial branch atheromatous irregularity. There is severe right A1 segment narrowing which is new from 2020 7. 60% proximal left subclavian stenosis. Moderate left vertebral origin   ASSESSMENT/PLAN:  ICA stenosis B carotids with new stroke symptom of left UE weakness.    She appears confused and not a good historian.  No family present.  The CTA gives the  impression of progressive  Of  right worse than left cerebral watershed ischemia.    She was on Brillinta for medical management.  Her previous work up was negative for A fib and workup included 2D echo without cardiac source of embolism, negative for PFO, loop recorder.    She does have CTA changes and left UE weakness.  I will discuss this with Dr. FiOneida Alar This is not a clear cut symptomatic carotid stenosis.     EmRoxy Horseman/11/23/2353:7:32M  Complicated course over last few months.  At this point she now has right gaze preference and some confusion/memory loss.  She also has some weakness in her right upper extremity with lack of wrist extension and elbow extension.  She may also have some left leg weakness but more subtle.  She has bilateral >80% ICA stenosis and has failed medical management on 2 occasions now.  The lesions are both fairly high so we will evaluate for TCAR stenting right followed by left.  Most likely this hospitalization depending on her recovery.  I will switch her back to plavix for now in anticipation of TCAR as bleeding risk from brilinta would be higher periprocedure.  Will follow to see how she recovers over next few days  ChRuta HindsMD [facility] [place]: [phone]

## 2019-10-23 NOTE — ED Provider Notes (Signed)
[facility] EMERGENCY DEPARTMENT Provider Note   CSN: [id number] Arrival date & time: 10/22/2019  5102     History CC: Confusion, left sided weakness  Pamela Carney is a 54 y.o. female w/ hx of lacunar infarct, left critical ICA stenosis, on aspirin & brillinta, presenting to ED with confusion and left sided weakness.   Her husband [name] by phone tells me that these symptoms began 4 days ago, on Thursday.  He states she has been confused, "repeating words at me," and had left hand weakness, dropping objects at home.  He brought her to the [facility] 10/21/19, at which time she a CT head wo contrast which showed no clear evidence of acute infarct, and was discharged home.  Today she returns with persistent symptoms of confusion and left sided weakness.    The patient cannot provide a reliable history due to confusion, but can answer focused questions. She denies chest pain or SOB.  She denies headache or vision changes.  She endorses numbness and weakness in her left hand and her left leg.  Medical record review shows hospitalization for multifocal acute ischemic infarcts in early July 2021 in watershed distribution.  She was noted to have significant ICA stenosis at that time and was seen by vascular surgery while in the hospital, with plan for outpatient f/u.  Per Dr Ainsley Spinner consult note on 09/23/19:  "Discussed with the patient that I do not think her carotid disease is symptomatic at this time and would not recommend urgent surgical intervention.  Her MRI shows bilateral hemispheric infarcts most pronounced posteriorly and she had no focal neurologic symptoms on presentation.  She came in with generalized weakness over the last three days and profuse diarrhea for the last 4 months.  Presentation more suggestive of cardioembolic or hypoperfusion.  Appreciate neurology and medicine evaluation.  Would likely recommend follow-up with Dr. Donzetta Matters as an outpatient and he can then make a decision  about elective intervention on the left carotid if he determines she is an appropriate surgery candidate. "  HPI     Past Medical History:  Diagnosis Date  . Abnormal Pap smear   . AKI (acute kidney injury) ([place]) 12/28/2017  . Anxiety   . Arthritis    bil knees, neck  . Bipolar 1 disorder ([place])   . Cataract    Mild  . Cholelithiasis 12/23/2017   noted on CT renal, pt unaware  . Chronic kidney disease (CKD), stage III (moderate)   . Cystitis   . Depression   . Diabetes mellitus without complication ([place])    type 2  . Genital warts    Hx of genital  . GERD (gastroesophageal reflux disease)   . Heart murmur    childhood  . Hematuria   . History of blood transfusion   . History of ectopic pregnancy   . History of gestational diabetes   . History of kidney stones   . History of sepsis    after ectopic pregnancy  . Hyperlipidemia   . Hypertension   . Idiopathic peripheral neuropathy    both feet  . Leg ulcer, left ([place])   . Low back pain   . Migraines   . Obesity   . Oral candidiasis   . Pneumonia   . PONV (postoperative nausea and vomiting)    prolonged sedation  . Recurrent UTI   . Renal calculi 12/23/2017   Multiple bilateral nonobstructing, 2.2 cm lower pole partial staghorn on left, noted  on CT renal  . Sigmoid diverticulosis 12/23/2017   noted on CT renal, pt unaware  . Sleep apnea    uses cpap  . Stroke [facility])    Cryptogenic  . Ulcer of foot ([place])    Left  . Weakness     Patient Active Problem List   Diagnosis Date Noted  . Symptomatic carotid artery stenosis 11/18/2019  . Chronic mesenteric ischemia ([place]) 09/25/2019  . Adenoma of right adrenal gland 09/25/2019  . Hypovolemia   . Diarrhea   . Dizziness 09/23/2019  . Acute renal failure (ARF) ([place]) 06/21/2019  . Nausea 06/21/2019  . Dehydration 06/21/2019  . HLD (hyperlipidemia) 06/21/2019  . Acute metabolic encephalopathy 09/38/1829  . UTI (urinary tract infection) 04/27/2019  .  Cerebrovascular accident (CVA) ([place]) 12/22/2018  . Septic shock ([place]) 12/15/2018  . Ureteral stone with hydronephrosis   . Acute renal failure superimposed on stage 3 chronic kidney disease ([place])   . Type 2 diabetes mellitus with other specified complication ([place]) 93/71/6967  . Bipolar 1 disorder ([place]) 02/01/2018  . IUD (intrauterine device) in place 02/01/2018  . Obesity 02/01/2018  . Healthcare maintenance 02/01/2018  . CKD (chronic kidney disease), stage III 12/28/2017  . 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 Acute cystitis with hematuria   . Chronic interstitial nephritis 12/24/2017  . Generalized weakness 12/24/2017  . Sepsis ([place]) 12/24/2017  . Hypokalemia 12/24/2017  . Oral candidiasis 12/24/2017  . Neuropathy involving both lower extremities 01/31/2014  . Perimenopausal 12/29/2012  . Family planning, IUD (intrauterine device) check/reinsertion/removal 12/28/2012  . Pain 12/27/2012    Past Surgical History:  Procedure Laterality Date  . BUBBLE STUDY  12/23/2018   Procedure: BUBBLE STUDY;  Surgeon: Lelon Perla, MD;  Location: [facility] ENDOSCOPY;  Service: Cardiovascular;;  . CERVICAL CONE BIOPSY    . CESAREAN SECTION     x3  . CYSTOSCOPY W/ URETERAL STENT PLACEMENT Right 12/15/2018   Procedure: Cystoscopy, right retrograde ureteropyelogram, fluoroscopic interpretation, right double-J stent placement (24 cm x 6 [place]);  Surgeon: Franchot Gallo, MD;  Location: [facility];  Service: Urology;  Laterality: Right;  . CYSTOSCOPY WITH RETROGRADE PYELOGRAM, URETEROSCOPY AND STENT PLACEMENT Right 01/27/2019   Procedure: CYSTOSCOPY WITH RETROGRADE PYELOGRAM, URETEROSCOPY AND STENT PLACEMENT; BASKET STONE RETRIEVAL;  Surgeon: [place] Frock, MD;  Location: [facility] ORS;  Service: Urology;  Laterality: Right;  75 MINS  . CYSTOSCOPY/URETEROSCOPY/HOLMIUM LASER/STENT PLACEMENT Right 02/04/2018   Procedure: CYSTOSCOPY/URETEROSCOPY/RETROGRADE PYELOGRAM/HOLMIUM LASER/STENT PLACEMENT;  Surgeon: [place] Frock, MD;  Location: [facility] ORS;  Service: Urology;  Laterality: Right;  . CYSTOSCOPY/URETEROSCOPY/HOLMIUM LASER/STENT PLACEMENT Right 02/07/2018   Procedure: CYSTOSCOPY LEFT STENT EXCHANGE;  Surgeon: [place] Frock, MD;  Location: [facility] ORS;  Service: Urology;  Laterality: Right;  . DILATION AND CURETTAGE OF UTERUS    . ECTOPIC PREGNANCY SURGERY    . IR NEPHROSTOMY PLACEMENT LEFT  12/25/2017  . LOOP RECORDER INSERTION N/A 12/23/2018   Procedure: LOOP RECORDER INSERTION;  Surgeon: Constance Haw, MD;  Location: [facility] CV LAB;  Service: Cardiovascular;  Laterality: N/A;  . NEPHROLITHOTOMY Left 02/04/2018   Procedure: NEPHROLITHOTOMY PERCUTANEOUS;  Surgeon: [place] Frock, MD;  Location: [facility] ORS;  Service: Urology;  Laterality: Left;  3 HRS  . NEPHROLITHOTOMY Left 02/07/2018   Procedure: SECOND LOOK NEPHROLITHOTOMY PERCUTANEOUS;  Surgeon: [place] Frock, MD;  Location: [facility] ORS;  Service: Urology;  Laterality: Left;  2 HRS  . OVARIAN CYST REMOVAL    . TEE WITHOUT CARDIOVERSION N/A 12/23/2018   Procedure: TRANSESOPHAGEAL ECHOCARDIOGRAM (TEE);  Surgeon: Stanford Breed,  Denice Bors, MD;  Location[facility] ENDOSCOPY;  Service: Cardiovascular;  Laterality: N/A;  . UNILATERAL SALPINGECTOMY     Pt unsure of which fallopian tube was removed  . WISDOM TOOTH EXTRACTION       OB History    Gravida  7   Para  3   Term      Preterm  3   AB  2   Living  3     SAB  1   TAB      Ectopic  1   Multiple      Live Births  5           Family History  Problem Relation Age of Onset  . Diabetes Paternal Grandfather   . COPD Paternal Grandfather   . Heart disease Paternal Grandfather   . Cancer Paternal Grandfather        bone  . Cancer Paternal Grandmother   . Heart disease Paternal Grandmother   . Cancer Father   . Hypertension Father   . Heart attack Father   . Alcohol abuse Father   . Depression Father   . Heart failure Mother   . Diabetes Mother   . Heart disease Mother   .  Hyperlipidemia Mother   . Hypertension Mother   . Heart attack Mother   . Peripheral vascular disease Mother   . Depression Mother   . COPD Mother   . Hypertension Sister   . Heart attack Sister   . Stroke Maternal Grandmother   . Colon cancer Neg Hx   . Colon polyps Neg Hx   . Esophageal cancer Neg Hx   . Rectal cancer Neg Hx   . Stomach cancer Neg Hx     Social History   Tobacco Use  . Smoking status: Current Every Day Smoker    Packs/day: 1.00    Years: 37.00    Pack years: 37.00    Types: Cigarettes  . Smokeless tobacco: Never Used  Vaping Use  . Vaping Use: Never used  Substance Use Topics  . Alcohol use: No    Alcohol/week: 0.0 standard drinks  . Drug use: No    Home Medications Prior to Admission medications   Medication Sig Start Date End Date Taking? Authorizing Provider  aspirin 81 MG chewable tablet Chew 1 tablet (81 mg total) by mouth daily. 09/26/19   Andrew Au, MD  atorvastatin (LIPITOR) 80 MG tablet Take 1 tablet (80 mg total) by mouth daily at 6 PM. 01/16/19   [name], NP  AZO CRANBERRY GUMMIES PO Take 2 tablets by mouth daily.    [provider]  Blood Glucose Monitoring Suppl (ONETOUCH VERIO) w/Device KIT Use to check blood sugars every morning fasting and 2 hours after largest meal Patient taking differently: 1 each by Other route See admin instructions. Use to check blood sugars every morning fasting and 2 hours after largest meal 02/02/18   Danford, Valetta Fuller D, NP  cetirizine (ZYRTEC) 10 MG tablet Take 1 tablet (10 mg total) by mouth daily. 05/25/19 08/30/19  [name], NP  DULoxetine (CYMBALTA) 60 MG capsule TAKE 1 CAPSULE BY MOUTH DAILY Patient taking differently: Take 60 mg by mouth daily.  09/13/19   Charlott Rakes, MD  fluconazole (DIFLUCAN) 150 MG tablet Take 150 mg by mouth once. 09/15/19   [provider]  fluticasone (FLONASE) 50 MCG/ACT nasal spray Place 1 spray into both nostrils daily. Patient not taking:  Reported on 08/30/2019 06/24/19  09/22/19  Mercy Riding, MD  glucose blood (ONETOUCH VERIO) test strip Use to check blood sugars every morning fasting and 2 hours after largest meal Patient taking differently: 1 each by Other route See admin instructions. Use to check blood sugars every morning fasting and 2 hours after largest meal 02/23/19   [name], NP  ibuprofen (ADVIL) 200 MG tablet Take 200 mg by mouth every 6 (six) hours as needed for moderate pain.    [provider]  insulin glargine (LANTUS) 100 UNIT/ML Solostar Pen Inject 15 Units into the skin at bedtime. Patient taking differently: Inject 23 Units into the skin at bedtime.  08/28/19   [name], NP  Insulin Pen Needle (PEN NEEDLES) 31G X 8 MM MISC Use as instructed. Inject into the skin once nightly. Patient taking differently: 1 each by Other route See admin instructions. Use as instructed. Inject into the skin once nightly. 08/28/19   [name], NP  ketoconazole (NIZORAL) 2 % cream Apply 1 application topically 2 (two) times daily. 10/12/19   Hyatt, Max T, DPM  Multiple Vitamins-Minerals (ONE-A-DAY WOMENS 50+ ADVANTAGE) TABS Take 1 tablet by mouth daily.    [provider]  ondansetron (ZOFRAN) 4 MG tablet Take 1 tablet (4 mg total) by mouth every 8 (eight) hours as needed for nausea or vomiting. 05/26/19   [name], NP  ONE TOUCH LANCETS MISC Use to check blood sugars every morning fasting and 2 hours after largest meal Patient taking differently: 1 each by Other route See admin instructions. Use to check blood sugars every morning fasting and 2 hours after largest meal 02/02/18   Danford, Valetta Fuller D, NP  pregabalin (LYRICA) 75 MG capsule Take 1 capsule (75 mg total) by mouth 2 (two) times daily. 10/12/19   Hyatt, Max T, DPM  ticagrelor (BRILINTA) 90 MG TABS tablet Take 1 tablet (90 mg total) by mouth 2 (two) times daily. 09/25/19   Andrew Au, MD  topiramate (TOPAMAX) 100 MG tablet Take 1 tablet (100 mg  total) by mouth 2 (two) times daily. Patient taking differently: Take 50 mg by mouth 2 (two) times daily.  06/27/19 06/26/20  Frann Rider, NP    Allergies    Sulfa antibiotics, Latex, and Tape  Review of Systems   Review of Systems  Unable to perform ROS: Mental status change (level 5 caveat)    Physical Exam Updated Vital Signs BP (!) 164/120   Pulse 79   Temp 98.1 F (36.7 C) (Oral)   Resp 19   SpO2 95%   Physical Exam Vitals and nursing note reviewed.  Constitutional:      General: She is not in acute distress.    Appearance: She is well-developed.  HENT:     Head: Normocephalic and atraumatic.  Eyes:     Conjunctiva/sclera: Conjunctivae normal.  Cardiovascular:     Rate and Rhythm: Normal rate and regular rhythm.     Pulses: Normal pulses.  Pulmonary:     Effort: Pulmonary effort is normal. No respiratory distress.     Breath sounds: Normal breath sounds.  Abdominal:     General: There is no distension.     Palpations: Abdomen is soft.     Tenderness: There is no abdominal tenderness.  Musculoskeletal:     Cervical back: Neck supple.  Skin:    General: Skin is warm and dry.  Neurological:     Mental Status: She is alert.  GCS: GCS eye subscore is 4. GCS verbal subscore is 5. GCS motor subscore is 6.     Cranial Nerves: No dysarthria or facial asymmetry.     Comments: Right sided gaze deviation Diminished sensation in left arm and left leg 3/5 strength in left arm and left leg diffusely 5/5 strength and normal sensation in right extremities     ED Results / Procedures / Treatments   Labs (all labs ordered are listed, but only abnormal results are displayed) Labs Reviewed  CBC - Abnormal; Notable for the following components:      Result Value   MCV 101.4 (*)    Platelets 123 (*)    All other components within normal limits  COMPREHENSIVE METABOLIC PANEL - Abnormal; Notable for the following components:   Potassium 5.2 (*)    CO2 18 (*)     Glucose, Bld 188 (*)    BUN 22 (*)    Creatinine, Ser 1.21 (*)    GFR calc non Af Amer 51 (*)    GFR calc Af Amer 60 (*)    All other components within normal limits  URINALYSIS, ROUTINE W REFLEX MICROSCOPIC - Abnormal; Notable for the following components:   APPearance CLOUDY (*)    Hgb urine dipstick SMALL (*)    Protein, ur 30 (*)    Nitrite POSITIVE (*)    Leukocytes,Ua LARGE (*)    WBC, UA >50 (*)    Bacteria, UA RARE (*)    All other components within normal limits  I-STAT CHEM 8, ED - Abnormal; Notable for the following components:   BUN 26 (*)    Creatinine, Ser 1.20 (*)    Glucose, Bld 196 (*)    All other components within normal limits  CBG MONITORING, ED - Abnormal; Notable for the following components:   Glucose-Capillary 156 (*)    All other components within normal limits  TROPONIN I (HIGH SENSITIVITY) - Abnormal; Notable for the following components:   Troponin I (High Sensitivity) 174 (*)    All other components within normal limits  TROPONIN I (HIGH SENSITIVITY) - Abnormal; Notable for the following components:   Troponin I (High Sensitivity) 167 (*)    All other components within normal limits  SARS CORONAVIRUS 2 BY RT PCR (HOSPITAL ORDER, [facility])  URINE CULTURE  ETHANOL  PROTIME-INR  DIFFERENTIAL  RAPID URINE DRUG SCREEN, HOSP PERFORMED  APTT  PROTIME-INR  FOLATE  VITAMIN B12  CBC  COMPREHENSIVE METABOLIC PANEL  I-STAT BETA HCG BLOOD, ED ([facility], [facility], [facility] ONLY)    EKG EKG Interpretation  Date/Time:  Monday October 23 2019 09:32:14 EDT Ventricular Rate:  80 PR Interval:    QRS Duration: 106 QT Interval:  386 QTC Calculation: 446 R Axis:   93 Text Interpretation: Sinus rhythm Borderline right axis deviation Nonspecific T abnormalities, diffuse leads No STMEI Confirmed by Octaviano Glow [phone]) on 11/01/2019 9:34:08 AM   Radiology CT Angio Head W or Wo Contrast  Result Date: 10/29/2019 CLINICAL DATA:  Weakness for 2  days. EXAM: CT ANGIOGRAPHY HEAD AND NECK CT PERFUSION BRAIN TECHNIQUE: Multidetector CT imaging of the head and neck was performed using the standard protocol during bolus administration of intravenous contrast. Multiplanar CT image reconstructions and MIPs were obtained to evaluate the vascular anatomy. Carotid stenosis measurements (when applicable) are obtained utilizing NASCET criteria, using the distal internal carotid diameter as the denominator. Multiphase CT imaging of the brain was performed following IV bolus contrast  injection. Subsequent parametric perfusion maps were calculated using RAPID software. CONTRAST:  Dose is currently not known COMPARISON:  Brain MRI and MRA 09/23/2019. Noncontrast head CT from 2 days ago. Neck CTA 12/21/2018 FINDINGS: CT HEAD FINDINGS Brain: Occipital and frontal parietal cortical and white matter infarct by recent brain MRI. There may have been progression from previous allowing for cross modality differences. No hemorrhage, hydrocephalus, or masslike finding Vascular: Negative Skull: No acute or aggressive finding Sinuses/Orbits: Negative Other: Discussed with Dr. Leonel Ramsay in person CTA NECK FINDINGS Aortic arch: Heavily calcified aorta with 3 vessel branching. No aneurysm or dissection. Right carotid system: Diffuse atheromatous wall thickening of the brachiocephalic and common carotid arteries. Severe proximal ICA stenosis, ~ 3 cm beyond the bifurcation and just below the right mandibular angle. No superimposed dissection or ulceration. Left carotid system: Diffuse atheromatous wall thickening of the common carotid. Proximal ICA stenosis with positive remodeling and low-density plaque at the bulb. Critical stenosis at the distal bulb with non measurable enhancing lumen. No superimposed ulceration or dissection is seen. Vertebral arteries: Atheromatous bilateral subclavian artery. Left subclavian origin stenosis measures 60%. At least moderate narrowing of the left  vertebral artery at the origin, quantification limited by small vessel size and obliquity. Milder right V1 segment stenosis. Skeleton: Extensive dental erosions. Multilevel cervical disc narrowing and protrusion. Other neck: No evidence of mass or inflammation Upper chest: Mosaic attenuation of the apical lungs likely related to smoking. Review of the MIP images confirms the above findings CTA HEAD FINDINGS Anterior circulation: Heavily calcified carotid siphons with 60% narrowing at the petrous segment on the right. Slightly less intense enhancement of the right ICA like related to the stenosis in the neck. No major branch occlusion; diffuse atheromatous irregularity of M3 and M4 branches, worse on the right based on MIPS. Worsened, very diminutive right A1 segment. Posterior circulation: Severe left vertebral stenosis beyond the patent PICA. Diminutive basilar due to hypoplastic P1 segments, with mild narrowing. Atheromatous irregularity of the right more than left PCA with diffuse attenuation on the right, unchanged. Venous sinuses: Unremarkable in the arterial phase Anatomic variants: Phase as above Review of the MIP images confirms the above findings CT Brain Perfusion Findings: CBF (<30%) Volume: 70m Perfusion (Tmax>6.0s) volume: 1187mand roughly located along the bilateral cerebral watershed territories, worse on the right. Mismatch Volume: 11264mMPRESSION: 1. No emergent vascular finding. 2. Recent watershed type infarcts by brain MRI. There may have been progression since 09/23/2019. 3. Severe atherosclerosis with right worse than left cerebral watershed ischemia by CT perfusion. 4. Critical bilateral proximal ICA narrowings, progressed from 2020. 60% right ICA petrous segment narrowing. 5. Chronic severe left V4 segment stenosis with minimal contribution to the basilar. 6. Advanced intracranial branch atheromatous irregularity. There is severe right A1 segment narrowing which is new from 2020 7. 60%  proximal left subclavian stenosis. Moderate left vertebral origin narrowing. Electronically Signed   By: JonMonte FantasiaD.   On: 10/29/2019 11:43   CT Angio Neck W and/or Wo Contrast  Result Date: 10/25/2019 CLINICAL DATA:  Weakness for 2 days. EXAM: CT ANGIOGRAPHY HEAD AND NECK CT PERFUSION BRAIN TECHNIQUE: Multidetector CT imaging of the head and neck was performed using the standard protocol during bolus administration of intravenous contrast. Multiplanar CT image reconstructions and MIPs were obtained to evaluate the vascular anatomy. Carotid stenosis measurements (when applicable) are obtained utilizing NASCET criteria, using the distal internal carotid diameter as the denominator. Multiphase CT imaging of the brain was performed following  IV bolus contrast injection. Subsequent parametric perfusion maps were calculated using RAPID software. CONTRAST:  Dose is currently not known COMPARISON:  Brain MRI and MRA 09/23/2019. Noncontrast head CT from 2 days ago. Neck CTA 12/21/2018 FINDINGS: CT HEAD FINDINGS Brain: Occipital and frontal parietal cortical and white matter infarct by recent brain MRI. There may have been progression from previous allowing for cross modality differences. No hemorrhage, hydrocephalus, or masslike finding Vascular: Negative Skull: No acute or aggressive finding Sinuses/Orbits: Negative Other: Discussed with Dr. Leonel Ramsay in person CTA NECK FINDINGS Aortic arch: Heavily calcified aorta with 3 vessel branching. No aneurysm or dissection. Right carotid system: Diffuse atheromatous wall thickening of the brachiocephalic and common carotid arteries. Severe proximal ICA stenosis, ~ 3 cm beyond the bifurcation and just below the right mandibular angle. No superimposed dissection or ulceration. Left carotid system: Diffuse atheromatous wall thickening of the common carotid. Proximal ICA stenosis with positive remodeling and low-density plaque at the bulb. Critical stenosis at the distal  bulb with non measurable enhancing lumen. No superimposed ulceration or dissection is seen. Vertebral arteries: Atheromatous bilateral subclavian artery. Left subclavian origin stenosis measures 60%. At least moderate narrowing of the left vertebral artery at the origin, quantification limited by small vessel size and obliquity. Milder right V1 segment stenosis. Skeleton: Extensive dental erosions. Multilevel cervical disc narrowing and protrusion. Other neck: No evidence of mass or inflammation Upper chest: Mosaic attenuation of the apical lungs likely related to smoking. Review of the MIP images confirms the above findings CTA HEAD FINDINGS Anterior circulation: Heavily calcified carotid siphons with 60% narrowing at the petrous segment on the right. Slightly less intense enhancement of the right ICA like related to the stenosis in the neck. No major branch occlusion; diffuse atheromatous irregularity of M3 and M4 branches, worse on the right based on MIPS. Worsened, very diminutive right A1 segment. Posterior circulation: Severe left vertebral stenosis beyond the patent PICA. Diminutive basilar due to hypoplastic P1 segments, with mild narrowing. Atheromatous irregularity of the right more than left PCA with diffuse attenuation on the right, unchanged. Venous sinuses: Unremarkable in the arterial phase Anatomic variants: Phase as above Review of the MIP images confirms the above findings CT Brain Perfusion Findings: CBF (<30%) Volume: 8m Perfusion (Tmax>6.0s) volume: 1179mand roughly located along the bilateral cerebral watershed territories, worse on the right. Mismatch Volume: 11265mMPRESSION: 1. No emergent vascular finding. 2. Recent watershed type infarcts by brain MRI. There may have been progression since 09/23/2019. 3. Severe atherosclerosis with right worse than left cerebral watershed ischemia by CT perfusion. 4. Critical bilateral proximal ICA narrowings, progressed from 2020. 60% right ICA petrous  segment narrowing. 5. Chronic severe left V4 segment stenosis with minimal contribution to the basilar. 6. Advanced intracranial branch atheromatous irregularity. There is severe right A1 segment narrowing which is new from 2020 7. 60% proximal left subclavian stenosis. Moderate left vertebral origin narrowing. Electronically Signed   By: JonMonte FantasiaD.   On: 11/12/2019 11:43   CT CEREBRAL PERFUSION W CONTRAST  Result Date: 11/09/2019 CLINICAL DATA:  Weakness for 2 days. EXAM: CT ANGIOGRAPHY HEAD AND NECK CT PERFUSION BRAIN TECHNIQUE: Multidetector CT imaging of the head and neck was performed using the standard protocol during bolus administration of intravenous contrast. Multiplanar CT image reconstructions and MIPs were obtained to evaluate the vascular anatomy. Carotid stenosis measurements (when applicable) are obtained utilizing NASCET criteria, using the distal internal carotid diameter as the denominator. Multiphase CT imaging of the brain was performed  following IV bolus contrast injection. Subsequent parametric perfusion maps were calculated using RAPID software. CONTRAST:  Dose is currently not known COMPARISON:  Brain MRI and MRA 09/23/2019. Noncontrast head CT from 2 days ago. Neck CTA 12/21/2018 FINDINGS: CT HEAD FINDINGS Brain: Occipital and frontal parietal cortical and white matter infarct by recent brain MRI. There may have been progression from previous allowing for cross modality differences. No hemorrhage, hydrocephalus, or masslike finding Vascular: Negative Skull: No acute or aggressive finding Sinuses/Orbits: Negative Other: Discussed with Dr. Leonel Ramsay in person CTA NECK FINDINGS Aortic arch: Heavily calcified aorta with 3 vessel branching. No aneurysm or dissection. Right carotid system: Diffuse atheromatous wall thickening of the brachiocephalic and common carotid arteries. Severe proximal ICA stenosis, ~ 3 cm beyond the bifurcation and just below the right mandibular angle. No  superimposed dissection or ulceration. Left carotid system: Diffuse atheromatous wall thickening of the common carotid. Proximal ICA stenosis with positive remodeling and low-density plaque at the bulb. Critical stenosis at the distal bulb with non measurable enhancing lumen. No superimposed ulceration or dissection is seen. Vertebral arteries: Atheromatous bilateral subclavian artery. Left subclavian origin stenosis measures 60%. At least moderate narrowing of the left vertebral artery at the origin, quantification limited by small vessel size and obliquity. Milder right V1 segment stenosis. Skeleton: Extensive dental erosions. Multilevel cervical disc narrowing and protrusion. Other neck: No evidence of mass or inflammation Upper chest: Mosaic attenuation of the apical lungs likely related to smoking. Review of the MIP images confirms the above findings CTA HEAD FINDINGS Anterior circulation: Heavily calcified carotid siphons with 60% narrowing at the petrous segment on the right. Slightly less intense enhancement of the right ICA like related to the stenosis in the neck. No major branch occlusion; diffuse atheromatous irregularity of M3 and M4 branches, worse on the right based on MIPS. Worsened, very diminutive right A1 segment. Posterior circulation: Severe left vertebral stenosis beyond the patent PICA. Diminutive basilar due to hypoplastic P1 segments, with mild narrowing. Atheromatous irregularity of the right more than left PCA with diffuse attenuation on the right, unchanged. Venous sinuses: Unremarkable in the arterial phase Anatomic variants: Phase as above Review of the MIP images confirms the above findings CT Brain Perfusion Findings: CBF (<30%) Volume: 59m Perfusion (Tmax>6.0s) volume: 1189mand roughly located along the bilateral cerebral watershed territories, worse on the right. Mismatch Volume: 11235mMPRESSION: 1. No emergent vascular finding. 2. Recent watershed type infarcts by brain MRI.  There may have been progression since 09/23/2019. 3. Severe atherosclerosis with right worse than left cerebral watershed ischemia by CT perfusion. 4. Critical bilateral proximal ICA narrowings, progressed from 2020. 60% right ICA petrous segment narrowing. 5. Chronic severe left V4 segment stenosis with minimal contribution to the basilar. 6. Advanced intracranial branch atheromatous irregularity. There is severe right A1 segment narrowing which is new from 2020 7. 60% proximal left subclavian stenosis. Moderate left vertebral origin narrowing. Electronically Signed   By: JonMonte FantasiaD.   On: 11/03/2019 11:43    Procedures Procedures (including critical care time)  Medications Ordered in ED Medications  lactated ringers infusion ( Intravenous New Bag/Given 11/18/2019 1313)  atorvastatin (LIPITOR) tablet 80 mg (has no administration in time range)  enoxaparin (LOVENOX) injection 40 mg (has no administration in time range)  insulin aspart (novoLOG) injection 0-15 Units (2 Units Subcutaneous Given 11/12/2019 1659)  aspirin chewable tablet 81 mg (81 mg Oral Not Given 11/21/2019 1420)  clopidogrel (PLAVIX) tablet 75 mg (75 mg Oral Given 11/05/2019 1700)  iohexol (OMNIPAQUE) 350 MG/ML injection 100 mL (100 mLs Intravenous Contrast Given 11/21/2019 1150)  sodium chloride 0.9 % bolus 1,000 mL (0 mLs Intravenous Stopped 11/03/2019 1241)  cefTRIAXone (ROCEPHIN) 1 g in sodium chloride 0.9 % 100 mL IVPB (0 g Intravenous Stopped 11/18/2019 1439)    ED Course  I have reviewed the triage vital signs and the nursing notes.  Pertinent labs & imaging results that were available during my care of the patient were reviewed by me and considered in my medical decision making (see chart for details).  54 yo female presenting to ED with confusion, left sided weakness for 4 days.  Onset of symptoms confirmed by her husband.  She was outside the window period for a CODE STROKE even with concern for LVO.  I emergently consulted neurology  after my initial clinical assessment, who recommended CT perfusion scan and CTA head and neck, which were obtained.  These showed continued watershed infarct pattern and critical levels of vascular stenosis including the bilateral carotids.  Please see neurology consult note.  Vascular surgery team also consulted prior to medical admission.  The patient remained HD stable in the ED and could answer basic questions.  There was no indication for intubation at this time.  I personally reviewed her labs and w/u notable for likely UTI (+UA nitrites, leuks), elevated but stable troponins on repeat.  Cr stable near recent baseline at 1.2.  K+ mildly elevated at 5.2 without acute ECG changes.  WBC 9.2.    She did not present with SIRS criteria concerning for urosepsis, nor evidence of hypotension or shock.  Her ECG per my interpretation did not show acute ischemic changes to suggest ACS.  She had no chest pain or SOB on exam.  IV ceftriaxone ordered for UTI, IV fluids ordered for hypoperfusion   I personally reviewed her prior medical chart as noted above Additional history obtained from her husband Porschea Borys by phone.  Clinical Course as of Oct 23 1727  Mon Oct 23, 2019  8088 I spoke to Dr Leonel Ramsay from neurology - patient here with presentation concerning for LVO but symptoms for 4 days per family, not a CODE stroke but he requests CT perfusion scan and CTA head and neck.   [MT]  1137 Dr Leonel Ramsay reviewed images, feels this is likely hypoperfusion related to her vascular disease.  He advised medical admission and re-consultation with vascular surgery - orders placed.     [MT]  1152 IMPRESSION: 1. No emergent vascular finding. 2. Recent watershed type infarcts by brain MRI. There may have been progression since 09/23/2019. 3. Severe atherosclerosis with right worse than left cerebral watershed ischemia by CT perfusion. 4. Critical bilateral proximal ICA narrowings, progressed from  2020. 60% right ICA petrous segment narrowing. 5. Chronic severe left V4 segment stenosis with minimal contribution to the basilar. 6. Advanced intracranial branch atheromatous irregularity. There is severe right A1 segment narrowing which is new from 2020 7. 60% proximal left subclavian stenosis. Moderate left vertebral origin narrowing.    [MT]  70 Spoke to vasc team, in the OR now, consult placed   [MT]  [place] husband updated on patient's status and likely diagnosis.     [MT]  1103 Signed out to IM hospital team   [MT]  1300 Trop elevated, with no active chest pain.  Recommending trending but doubtful of ACS at this time.   [MT]  1339 Likely UTI.  No SIRS criteria to suggest sepsis at this time.  Ordered IV ceftriaxone.   [MT]    Clinical Course User Index [MT] Felisa Zechman, Carola Rhine, MD    Final Clinical Impression(s) / ED Diagnoses Final diagnoses:  Acute cystitis with hematuria  Elevated troponin  Confusion  Carotid stenosis, bilateral    Rx / DC Orders ED Discharge Orders    None       Langston Masker Carola Rhine, MD 11/03/2019 1729

## 2019-10-23 NOTE — ED Notes (Signed)
Vascular at bedside

## 2019-10-23 NOTE — Consult Note (Signed)
Neurology Consultation  Reason for Consult: Confusion left-sided weakness Referring Physician: Wyvonnia Dusky, MD  CC: Confusion and left-sided weakness  History is obtained from: Chart and husband  HPI: VELEDA MUN is a 54 y.o. female with history of cryptogenic stroke obesity, hypertension, hyperlipidemia, diabetes. 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 At that time was noted through carotid Doppler that she had a right 50-79% stenosis and on the left 40-59% stenosis. 2D echo showed an EF of 55% with no source of embolus. It was recommended that patient stay on aspirin and Plavix for 3 weeks then aspirin alone. She was then again seen on 09/23/2019 for dizziness. At that time CTA of head and neck showed a positive occlusion of the distal left PCA that corresponded to the acute ischemia. Today's history was obtained from  Patient was seen 4 days ago for strokelike symptoms. At that time she was here for evaluation of weakness. Per note she did not describe any focal weakness she just felt weak all over at that time. Patient received a CT of head which did not show any acute stroke or bleed. Patient was sent home from ER at that time.  Patient returns today with confusion and left-sided weakness. She is currently on aspirin and Brilinta. Patient is very confused and cannot provide any history. Since her visit 4 days ago she has now developed left-sided weakness and  confusion. In talking to the husband she has had a left hemianopsia since previous strokes. The left-sided weakness is new. Since 4 days ago.   LKW: 4 days  tpa given?: no, out of window Premorbid modified Rankin scale (mRS): 3  NIHSS 1a Level of Conscious.: 0 1b LOC Questions: 2 1c LOC Commands: 0 2 Best Gaze: 2 3 Visual: 2 4 Facial Palsy: 1 5a Motor Arm - left: 1 5b  Motor Arm - Right: 0 6a Motor Leg - Left: 0 6b Motor Leg - Right: 0 7 Limb Ataxia: 1 8 Sensory: 0 9 Best Language: 0 10 Dysarthria: 0 11 Extinct. and Inatten.: 2 TOTAL: 11   Past Medical History:  Diagnosis Date  . Abnormal Pap smear   . AKI (acute kidney injury) ([place]) 12/28/2017  . Anxiety   . Arthritis    bil knees, neck  . Bipolar 1 disorder ([place])   . Cataract    Mild  . Cholelithiasis 12/23/2017   noted on CT renal, pt unaware  . Chronic kidney disease (CKD), stage III (moderate)   . Cystitis   . Depression   . Diabetes mellitus without complication ([place])    type 2  . Genital warts    Hx of genital  . GERD (gastroesophageal reflux disease)   . Heart murmur    childhood  . Hematuria   . History of blood transfusion   . History of ectopic pregnancy   . History of gestational diabetes   . History of kidney stones   . History of sepsis    after ectopic pregnancy  . Hyperlipidemia   . Hypertension   . Idiopathic peripheral neuropathy    both feet  . Leg ulcer, left ([place])   . Low back pain   . Migraines   . Obesity   . Oral candidiasis   . Pneumonia   . PONV (postoperative nausea and vomiting)    prolonged sedation  .  Recurrent UTI   . 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 Sigmoid diverticulosis 12/23/2017   noted on CT renal, pt unaware  . Sleep apnea    uses cpap  . Stroke [facility])    Cryptogenic  . Ulcer of foot ([place])    Left  . Weakness     Family History  Problem Relation Age of Onset  . Diabetes Paternal Grandfather   . COPD Paternal Grandfather   . Heart disease Paternal Grandfather   . Cancer Paternal Grandfather        bone  . Cancer Paternal Grandmother   . Heart disease Paternal Grandmother   . Cancer Father   . Hypertension Father   . Heart attack Father   . Alcohol abuse Father   . Depression Father   . Heart failure Mother   . Diabetes  Mother   . Heart disease Mother   . Hyperlipidemia Mother   . Hypertension Mother   . Heart attack Mother   . Peripheral vascular disease Mother   . Depression Mother   . COPD Mother   . Hypertension Sister   . Heart attack Sister   . Stroke Maternal Grandmother   . Colon cancer Neg Hx   . Colon polyps Neg Hx   . Esophageal cancer Neg Hx   . Rectal cancer Neg Hx   . Stomach cancer Neg Hx    Social History:   reports that she has been smoking cigarettes. She has a 37.00 pack-year smoking history. She has never used smokeless tobacco. She reports that she does not drink alcohol and does not use drugs.  Medications No current facility-administered medications for this encounter.  Current Outpatient Medications:  .  aspirin 81 MG chewable tablet, Chew 1 tablet (81 mg total) by mouth daily., Disp: 90 tablet, Rfl: 0 .  atorvastatin (LIPITOR) 80 MG tablet, Take 1 tablet (80 mg total) by mouth daily at 6 PM., Disp: 90 tablet, Rfl: 2 .  AZO CRANBERRY GUMMIES PO, Take 2 tablets by mouth daily., Disp: , Rfl:  .  Blood Glucose Monitoring Suppl (ONETOUCH VERIO) w/Device KIT, Use to check blood sugars every morning fasting and 2 hours after largest meal (Patient taking differently: 1 each by Other route See admin instructions. Use to check blood sugars every morning fasting and 2 hours after largest meal), Disp: 1 kit, Rfl: 0 .  cetirizine (ZYRTEC) 10 MG tablet, Take 1 tablet (10 mg total) by mouth daily., Disp: 90 tablet, Rfl: 2 .  DULoxetine (CYMBALTA) 60 MG capsule, TAKE 1 CAPSULE BY MOUTH DAILY (Patient taking differently: Take 60 mg by mouth daily. ), Disp: 90 capsule, Rfl: 1 .  fluconazole (DIFLUCAN) 150 MG tablet, Take 150 mg by mouth once., Disp: , Rfl:  .  fluticasone (FLONASE) 50 MCG/ACT nasal spray, Place 1 spray into both nostrils daily. (Patient not taking: Reported on 08/30/2019), Disp: 16 g, Rfl: 2 .  glucose blood (ONETOUCH VERIO) test strip, Use to check blood sugars every morning  fasting and 2 hours after largest meal (Patient taking differently: 1 each by Other route See admin instructions. Use to check blood sugars every morning fasting and 2 hours after largest meal), Disp: 200 each, Rfl: 3 .  ibuprofen (ADVIL) 200 MG tablet, Take 200 mg by mouth every 6 (six) hours as needed for moderate pain., Disp: , Rfl:  .  insulin glargine (LANTUS) 100 UNIT/ML  Solostar Pen, Inject 15 Units into the skin at bedtime. (Patient taking differently: Inject 23 Units into the skin at bedtime. ), Disp: 15 mL, Rfl: prn .  Insulin Pen Needle (PEN NEEDLES) 31G X 8 MM MISC, Use as instructed. Inject into the skin once nightly. (Patient taking differently: 1 each by Other route See admin instructions. Use as instructed. Inject into the skin once nightly.), Disp: 100 each, Rfl: 3 .  ketoconazole (NIZORAL) 2 % cream, Apply 1 application topically 2 (two) times daily., Disp: 15 g, Rfl: 2 .  Multiple Vitamins-Minerals (ONE-A-DAY WOMENS 50+ ADVANTAGE) TABS, Take 1 tablet by mouth daily., Disp: , Rfl:  .  ondansetron (ZOFRAN) 4 MG tablet, Take 1 tablet (4 mg total) by mouth every 8 (eight) hours as needed for nausea or vomiting., Disp: 20 tablet, Rfl: 1 .  ONE TOUCH LANCETS MISC, Use to check blood sugars every morning fasting and 2 hours after largest meal (Patient taking differently: 1 each by Other route See admin instructions. Use to check blood sugars every morning fasting and 2 hours after largest meal), Disp: 200 each, Rfl: 3 .  pregabalin (LYRICA) 75 MG capsule, Take 1 capsule (75 mg total) by mouth 2 (two) times daily., Disp: 60 capsule, Rfl: 3 .  ticagrelor (BRILINTA) 90 MG TABS tablet, Take 1 tablet (90 mg total) by mouth 2 (two) times daily., Disp: 60 tablet, Rfl: 0 .  topiramate (TOPAMAX) 100 MG tablet, Take 1 tablet (100 mg total) by mouth 2 (two) times daily. (Patient taking differently: Take 50 mg by mouth 2 (two) times daily. ), Disp: 180 tablet, Rfl: 3  ROS: Unable to obtain secondary to  confusion  Exam: Current vital signs: BP (!) 171/105   Pulse 74   Temp 98.1 F (36.7 C) (Oral)   Resp 16   SpO2 95%  Vital signs in last 24 hours: Temp:  [98.1 F (36.7 C)] 98.1 F (36.7 C) (08/02 0934) Pulse Rate:  [74-79] 74 (08/02 1144) Resp:  [16-19] 16 (08/02 1144) BP: (124-171)/(82-105) 171/105 (08/02 1135) SpO2:  [94 %-95 %] 95 % (08/02 1144)   Constitutional: Appears well-developed and well-nourished.  Eyes: No scleral injection HENT: No OP obstrucion Head: Normocephalic.  Cardiovascular: Normal rate and regular rhythm.  Respiratory: Effort normal, non-labored breathing GI: Soft.  No distension. There is no tenderness.  Skin: WDI  Neuro: Mental Status: Patient is awake, alert, not oriented to person, place, month, year, and situation. Speech-shows no dysarthria however she does show possible expressive aphasia as she is very confused and perseverates on either left hand or thumb Cranial Nerves: II: Visual Fields are full.  III,IV, VI: Forced right gaze however can cross midline with doll's eyes. Pupils equal, round and reactive to light V: Difficult to assess this patient could not tell me if either side was different VII: Left facial droop VIII: hearing is intact to voice X: Palat elevates symmetrically XI: Shoulder shrug is symmetric. XII: tongue is midline without atrophy or fasciculations.  Motor: Right arm 5/5, right leg 5/5, left arm 3/5, left leg 5/5 Drift in left arm Sensory: Withdraws to noxious stimuli and right arm, right leg, left leg.  DSS-continues to say left with testing both right, left, both Deep Tendon Reflexes: 2+ and symmetric in the biceps and patellae.  Plantars: Upgoing on the left downgoing on the right Cerebellar: FNF intact on the right dysmetric on the left secondary to weakness could not do heel-to-shin as she did not understand what I was  asking  Labs I have reviewed labs in epic and the results pertinent to this  consultation are:   CBC    Component Value Date/Time   WBC 9.2 11/14/2019 1009   RBC 4.27 11/21/2019 1009   HGB 14.6 10/26/2019 1020   HGB 15.1 08/28/2019 1441   HCT 43.0 11/21/2019 1020   HCT 46.2 08/28/2019 1441   PLT 123 (L) 11/04/2019 1009   PLT 394 08/28/2019 1441   MCV 101.4 (H) 11/05/2019 1009   MCV 96 08/28/2019 1441   MCH 31.6 11/11/2019 1009   MCHC 31.2 11/18/2019 1009   RDW 13.2 10/29/2019 1009   RDW 14.5 08/28/2019 1441   LYMPHSABS 2.1 10/29/2019 1009   LYMPHSABS 2.8 10/05/2013 1532   MONOABS 0.4 11/05/2019 1009   EOSABS 0.1 11/07/2019 1009   EOSABS 0.1 10/05/2013 1532   BASOSABS 0.0 11/05/2019 1009   BASOSABS 0.0 10/05/2013 1532    CMP     Component Value Date/Time   NA 143 11/13/2019 1020   NA 142 08/28/2019 1441   K 3.8 10/22/2019 1020   CL 109 11/02/2019 1020   CO2 21 (L) 10/21/2019 0212   GLUCOSE 196 (H) 11/08/2019 1020   BUN 26 (H) 11/13/2019 1020   BUN 29 (H) 08/28/2019 1441   CREATININE 1.20 (H) 11/01/2019 1020   CALCIUM 9.3 10/21/2019 0212   PROT 6.5 10/21/2019 0212   PROT 7.5 08/28/2019 1441   ALBUMIN 3.1 (L) 10/21/2019 0212   ALBUMIN 4.6 08/28/2019 1441   AST 16 10/21/2019 0212   ALT 19 10/21/2019 0212   ALKPHOS 110 10/21/2019 0212   BILITOT 0.4 10/21/2019 0212   BILITOT <0.2 08/28/2019 1441   GFRNONAA 42 (L) 10/21/2019 0212   GFRAA 49 (L) 10/21/2019 0212    Lipid Panel     Component Value Date/Time   CHOL 224 (H) 09/24/2019 0630   TRIG 468 (H) 09/24/2019 0630   HDL 28 (L) 09/24/2019 0630   CHOLHDL 8.0 09/24/2019 0630   VLDL UNABLE TO CALCULATE IF TRIGLYCERIDE OVER 400 mg/dL 09/24/2019 0630   LDLCALC UNABLE TO CALCULATE IF TRIGLYCERIDE OVER 400 mg/dL 09/24/2019 0630   LDLDIRECT 112.7 (H) 09/24/2019 0630     Imaging I have reviewed the images obtained:  CTA head neck along with perfusion-no emergent vascular findings. Recent watershed type infarcts by MRI I there may been some progression since 09/23/2019. Severe  atherosclerosis within the right worse than left cerebral watershed ischemia by CT perfusion. Critical bilateral ICA narrowing, progressed from 2020. 60% on the right ICA petrous segment narrowing. Chronic severe left V4 segment stenosis with minimal contribution to the basilar. Advanced intracranial branch atheromatous irregularity. There is a severe right A1 segment narrowing which is new from 2020. 60% proximal left subclavian stenosis. Moderate left vertebral origin narrowing.  Etta Quill PA-C [facility] [phone]  M-F  (9:00 am- 5:00 PM)  11/16/2019, 11:58 AM   I have seen the patient reviewed the above note.  She has findings consistent with recurrent stroke.  Assessment:  This is a 54 year old female with recent admission for watershed ischemia who has been found to have bilateral severe carotid stenosis with diminutive posterior circulation due to bilateral fetal PCAs.  She is in a state of hypoperfusion due to this.  She has been symptomatic for a couple of days, but I think likely due to neglect but not sought care.   Recommendations: -LDL, A1c -Continue aspirin and Brilinta -MRI brain -Vascular surgery planning on TCAR stenting. -Stroke team to follow  Roland Rack, MD [facility] [phone]  If 7pm- 7am, please page neurology on call as listed in [place].

## 2019-10-24 LAB — COMPREHENSIVE METABOLIC PANEL
ALT: 19 U/L (ref 0–44)
AST: 16 U/L (ref 15–41)
Albumin: 3.2 g/dL — ABNORMAL LOW (ref 3.5–5.0)
Alkaline Phosphatase: 106 U/L (ref 38–126)
Anion gap: 15 (ref 5–15)
BUN: 19 mg/dL (ref 6–20)
CO2: 22 mmol/L (ref 22–32)
Calcium: 9.7 mg/dL (ref 8.9–10.3)
Chloride: 106 mmol/L (ref 98–111)
Creatinine, Ser: 1.11 mg/dL — ABNORMAL HIGH (ref 0.44–1.00)
GFR calc Af Amer: >60 mL/min (ref >60)
GFR calc non Af Amer: 57 mL/min — ABNORMAL LOW (ref >60)
Glucose, Bld: 114 mg/dL — ABNORMAL HIGH (ref 70–99)
Potassium: 3.3 mmol/L — ABNORMAL LOW (ref 3.5–5.1)
Sodium: 143 mmol/L (ref 135–145)
Total Bilirubin: 0.9 mg/dL (ref 0.3–1.2)
Total Protein: 6.3 g/dL — ABNORMAL LOW (ref 6.5–8.1)

## 2019-10-24 LAB — CBC
HCT: 40.5 % (ref 36.0–46.0)
Hemoglobin: 13 g/dL (ref 12.0–15.0)
MCH: 31.8 pg (ref 26.0–34.0)
MCHC: 32.1 g/dL (ref 30.0–36.0)
MCV: 99 fL (ref 80.0–100.0)
Platelets: 286 10*3/uL (ref 150–400)
RBC: 4.09 MIL/uL (ref 3.87–5.11)
RDW: 13.2 % (ref 11.5–15.5)
WBC: 9.5 10*3/uL (ref 4.0–10.5)
nRBC: 0 % (ref 0.0–0.2)

## 2019-10-24 LAB — URINE CULTURE

## 2019-10-24 LAB — LIPID PANEL
Cholesterol: 200 mg/dL (ref 0–200)
HDL: 32 mg/dL — ABNORMAL LOW (ref >40)
LDL Cholesterol: 113 mg/dL — ABNORMAL HIGH (ref 0–99)
Total CHOL/HDL Ratio: 6.3 RATIO
Triglycerides: 276 mg/dL — ABNORMAL HIGH (ref <150)
VLDL: 55 mg/dL — ABNORMAL HIGH (ref 0–40)

## 2019-10-24 LAB — GLUCOSE, CAPILLARY
Glucose-Capillary: 114 mg/dL — ABNORMAL HIGH (ref 70–99)
Glucose-Capillary: 114 mg/dL — ABNORMAL HIGH (ref 70–99)
Glucose-Capillary: 123 mg/dL — ABNORMAL HIGH (ref 70–99)
Glucose-Capillary: 128 mg/dL — ABNORMAL HIGH (ref 70–99)
Glucose-Capillary: 131 mg/dL — ABNORMAL HIGH (ref 70–99)
Glucose-Capillary: 131 mg/dL — ABNORMAL HIGH (ref 70–99)
Glucose-Capillary: 136 mg/dL — ABNORMAL HIGH (ref 70–99)
Glucose-Capillary: 136 mg/dL — ABNORMAL HIGH (ref 70–99)
Glucose-Capillary: 147 mg/dL — ABNORMAL HIGH (ref 70–99)

## 2019-10-24 LAB — HEMOGLOBIN A1C
Hgb A1c MFr Bld: 7.4 % — ABNORMAL HIGH (ref 4.8–5.6)
Mean Plasma Glucose: 165.68 mg/dL

## 2019-10-24 MED ORDER — LACTATED RINGERS IV BOLUS
500.0000 mL | Freq: Once | INTRAVENOUS | Status: AC
  Administered 2019-10-24: 500 mL via INTRAVENOUS

## 2019-10-24 MED ORDER — INSULIN GLARGINE 100 UNIT/ML ~~LOC~~ SOLN
7.0000 [IU] | Freq: Every day | SUBCUTANEOUS | Status: DC
  Administered 2019-10-24 – 2019-10-29 (×6): 7 [IU] via SUBCUTANEOUS
  Filled 2019-10-24 (×7): qty 0.07

## 2019-10-24 MED ORDER — INSULIN ASPART 100 UNIT/ML ~~LOC~~ SOLN
0.0000 [IU] | Freq: Three times a day (TID) | SUBCUTANEOUS | Status: DC
  Administered 2019-10-24 – 2019-10-25 (×2): 1 [IU] via SUBCUTANEOUS
  Administered 2019-10-25: 2 [IU] via SUBCUTANEOUS
  Administered 2019-10-25 – 2019-10-26 (×2): 1 [IU] via SUBCUTANEOUS
  Administered 2019-10-26 (×2): 2 [IU] via SUBCUTANEOUS

## 2019-10-24 MED ORDER — POTASSIUM CHLORIDE 10 MEQ/100ML IV SOLN
10.0000 meq | INTRAVENOUS | Status: AC
  Administered 2019-10-24 (×3): 10 meq via INTRAVENOUS
  Filled 2019-10-24: qty 100

## 2019-10-24 NOTE — Evaluation (Signed)
Physical Therapy Evaluation Patient Details Name: Pamela Carney MRN: [id number] DOB: 05-31-1965 Today's Date: 10/24/2019   History of Present Illness  Pt is a 54 y/o female with a PMH significant for prior cryptogenic stroke, DM, CKD stage III, HTN, bipolar 1 disorder, chronic mesenteric ischemia. She presented to the ED with worsening L sided weakness. MRI on 8/2 reveals progression of acute infarcts compared to 1 month ago. Extensive watershed infarction bilaterally R greated than L. Chronic infarcts in the occipital lobes L greater than R also with progrsesion.   Clinical Impression  Pt admitted with above diagnosis. At the time of PT eval pt was able to perform bed mobility with max assist and transfers/ambulation with up to +2 min assist for balance and safety. Cognition significantly limiting session and home set-up/PLOF listed below is from prior admission. Recommending follow-up at the CIR level to maximize functional independence and safety prior to return home with family support. Pt currently with functional limitations due to the deficits listed below (see PT Problem List). Pt will benefit from acute skilled PT to allow discharge to the venue listed below.      Follow Up Recommendations CIR    Equipment Recommendations  None recommended by PT    Recommendations for Other Services       Precautions / Restrictions Precautions Precautions: Fall Precaution Comments:  Restrictions Weight Bearing Restrictions: No      Mobility  Bed Mobility Overal bed mobility: Needs Assistance Bed Mobility: Supine to Sit     Supine to sit: Max assist     General bed mobility comments: Max A to scoot L hip forward with pad to EOB  Transfers Overall transfer level: Needs assistance Equipment used: 2 person hand held assist Transfers: Sit to/from [facility] Sit to Stand: Min assist Stand pivot transfers: Min assist       General transfer comment: Min A with  multimodal cues for seqeuncing and coordination  Ambulation/Gait Ambulation/Gait assistance: Min assist;+2 safety/equipment Gait Distance (Feet): 5 Feet Assistive device: 1 person hand held assist Gait Pattern/deviations: Shuffle;Trunk flexed;Narrow base of support Gait velocity: Decreased Gait velocity interpretation: <1.31 ft/sec, indicative of household ambulator General Gait Details: Shuffling steps to get from [facility] to recliner. +2 helpful for line management.   Stairs            Wheelchair Mobility    Modified Rankin (Stroke Patients Only)       Balance Overall balance assessment: Needs assistance Sitting-balance support: Bilateral upper extremity supported Sitting balance-Leahy Scale: Fair Sitting balance - Comments: min guard   Standing balance support: Bilateral upper extremity supported;During functional activity Standing balance-Leahy Scale: Poor Standing balance comment: reliant on BUE support                             Pertinent Vitals/Pain Pain Assessment: Faces Faces Pain Scale: Hurts a little bit Pain Location: LLE when sitting on BSC Pain Descriptors / Indicators: Discomfort;Grimacing Pain Intervention(s): Limited activity within patient's tolerance;Monitored during session;Repositioned    Home Living Family/patient expects to be discharged to:: Private residence Living Arrangements: Spouse/significant other Available Help at Discharge: Family;Available 24 hours/day Type of Home: House Home Access: Stairs to enter Entrance Stairs-Rails: None Entrance Stairs-Number of Steps: 3 Home Layout: Two level Home Equipment: [place] - 2 wheels;Shower seat Additional Comments: Home set up and support gathered from chart review. Pt limited historian and unable to answer questions about home set up  and PLOF. She reports living with her daughter but unsure how accurate this is.     Prior Function           Comments: Unsure - pt  limited historian and unable to report PLOF     Hand Dominance   Dominant Hand: Left    Extremity/Trunk Assessment   Upper Extremity Assessment Upper Extremity Assessment: Defer to OT evaluation LUE Deficits / Details: 2+/5 in shoulder flexion against gravity but able to obtain full ROM with assist LUE Sensation: decreased proprioception LUE Coordination: decreased gross motor;decreased fine motor    Lower Extremity Assessment Lower Extremity Assessment: Generalized weakness;LLE deficits/detail LLE Deficits / Details: Strength grossly equal bilaterally with MMT however during functional activity noted decreased initiation of movement and what appeared to be strength deficits in LLE.  LLE Sensation: decreased light touch    Cervical / Trunk Assessment Cervical / Trunk Assessment: Normal (Forward head posture with rounded shoulders in standing. )  Communication   Communication: Expressive difficulties (difficulties with word finding)  Cognition Arousal/Alertness: Awake/alert Behavior During Therapy: Flat affect Overall Cognitive Status: Impaired/Different from baseline Area of Impairment: Orientation;Attention;Memory;Following commands;Safety/judgement;Awareness;Problem solving                 Orientation Level: Disoriented to;Time;Situation;Place (Knows she is at [facility] but does not know city/state) Current Attention Level: Focused Memory: Decreased recall of precautions;Decreased short-term memory Following Commands: Follows one step commands with increased time Safety/Judgement: Decreased awareness of safety;Decreased awareness of deficits Awareness: Intellectual Problem Solving: Slow processing;Decreased initiation;Difficulty sequencing;Requires verbal cues;Requires tactile cues General Comments: Pt with confusion unsure why she feels bad/in pain and could not describe where or what. Pt with urinary incontinence with transfer and unsure if she would need to go  again. Pt has slow processing and decreased initiation needing multimodal cues for sequencing      General Comments General comments (skin integrity, edema, etc.): Pt limited historian and unable to answer complex questions. inconsistent with yes and no     Exercises     Assessment/Plan    PT Assessment Patient needs continued PT services  PT Problem List Decreased strength;Decreased activity tolerance;Decreased range of motion;Decreased balance;Decreased mobility;Decreased coordination;Decreased cognition;Decreased knowledge of use of DME;Decreased safety awareness;Decreased knowledge of precautions;Impaired tone;Impaired sensation       PT Treatment Interventions DME instruction;Gait training;Stair training;Functional mobility training;Therapeutic activities;Therapeutic exercise;Neuromuscular re-education;Patient/family education    PT Goals (Current goals can be found in the Care Plan section)  Acute Rehab PT Goals Patient Stated Goal: get better PT Goal Formulation: Patient unable to participate in goal setting Time For Goal Achievement: 11/07/19 Potential to Achieve Goals: Good    Frequency Min 4X/week   Barriers to discharge        Co-evaluation PT/OT/SLP Co-Evaluation/Treatment: Yes Reason for Co-Treatment: Complexity of the patient's impairments (multi-system involvement);Necessary to address cognition/behavior during functional activity;For patient/therapist safety;To address functional/ADL transfers PT goals addressed during session: Mobility/safety with mobility;Balance;Strengthening/ROM OT goals addressed during session: ADL's and self-care;Proper use of Adaptive equipment and DME;Strengthening/ROM       AM-PAC PT "6 Clicks" Mobility  Outcome Measure Help needed turning from your back to your side while in a flat bed without using bedrails?: A Little Help needed moving from lying on your back to sitting on the side of a flat bed without using bedrails?:  Total Help needed moving to and from a bed to a chair (including a wheelchair)?: A Little Help needed standing up from a chair using your arms (e.g.,  wheelchair or bedside chair)?: A Little Help needed to walk in hospital room?: A Little Help needed climbing 3-5 steps with a railing? : Total 6 Click Score: 14    End of Session Equipment Utilized During Treatment: Gait belt Activity Tolerance: Patient tolerated treatment well Patient left: in chair;with call bell/phone within reach;with chair alarm set Nurse Communication: Mobility status PT Visit Diagnosis: Hemiplegia and hemiparesis Hemiplegia - Right/Left: Left Hemiplegia - dominant/non-dominant: Non-dominant Hemiplegia - caused by: Cerebral infarction    Time: 8307-3543 PT Time Calculation (min) (ACUTE ONLY): 34 min   Charges:   PT Evaluation $PT Eval Moderate Complexity: 1 Mod          Rolinda Roan, PT, DPT Acute Rehabilitation Services Pager: [phone] Office: [phone]   Thelma Comp 10/24/2019, 2:24 PM

## 2019-10-24 NOTE — Progress Notes (Signed)
STROKE TEAM PROGRESS NOTE   INTERVAL HISTORY   Sitting up in the chair. B ICA stenosis. Plans for TCAR on right side first by VVS then the other a few weeks later.  I have personally reviewed history of presenting illness with the patient, electronic medical records and imaging films in PACS.  She presented with left-sided weakness and has bilateral moderate to severe carotid stenosis.  Vitals:   10/24/19 0025 10/24/19 0411 10/24/19 0829 10/24/19 1203  BP: 114/83 122/81 (!) 135/100 122/78  Pulse: 70 77 74 72  Resp: 18 18 19 17   Temp: 98.5 F (36.9 C) 98 F (36.7 C) 98.1 F (36.7 C) 98 F (36.7 C)  TempSrc: Oral Oral Oral Oral  SpO2: 96% 98% 92% 96%   CBC:  Recent Labs  Lab 10/21/19 0212 10/21/19 0247 10/30/2019 1009 11/15/2019 1009 11/07/2019 1020 10/24/19 0326  WBC 11.9*   < > 9.2  --   --  9.5  NEUTROABS 9.1*  --  6.7  --   --   --   HGB 13.2   < > 13.5   < > 14.6 13.0  HCT 41.4   < > 43.3   < > 43.0 40.5  MCV 99.8   < > 101.4*  --   --  99.0  PLT 321   < > 123*  --   --  286   < > = values in this interval not displayed.   Basic Metabolic Panel:  Recent Labs  Lab 11/14/2019 1009 11/18/2019 1009 11/05/2019 1020 10/24/19 0326  NA 140   < > 143 143  K 5.2*   < > 3.8 3.3*  CL 107   < > 109 106  CO2 18*  --   --  22  GLUCOSE 188*   < > 196* 114*  BUN 22*   < > 26* 19  CREATININE 1.21*   < > 1.20* 1.11*  CALCIUM 9.9  --   --  9.7   < > = values in this interval not displayed.   Lipid Panel:  Recent Labs  Lab 10/24/19 0326  CHOL 200  TRIG 276*  HDL 32*  CHOLHDL 6.3  VLDL 55*  LDLCALC 113*   HgbA1c:  Recent Labs  Lab 10/24/19 0326  HGBA1C 7.4*   Urine Drug Screen:  Recent Labs  Lab 10/26/2019 1242  LABOPIA NONE DETECTED  COCAINSCRNUR NONE DETECTED  LABBENZ NONE DETECTED  AMPHETMU NONE DETECTED  THCU NONE DETECTED  LABBARB NONE DETECTED    Alcohol Level  Recent Labs  Lab 10/28/2019 1009  [place] <10    IMAGING past 24 hours MR BRAIN WO CONTRAST  Result  Date: 10/30/2019 CLINICAL DATA:  Stroke follow-up. EXAM: MRI HEAD WITHOUT CONTRAST TECHNIQUE: Multiplanar, multiecho pulse sequences of the brain and surrounding structures were obtained without intravenous contrast. COMPARISON:  MRI head 09/23/2019 FINDINGS: Brain: Multiple areas of acute infarct are present with progression since 1 month ago. Bilateral watershed infarcts in the deep white matter bilaterally have progressed and are more prominent the right than the left. Chronic cortical infarct in the left occipital lobe has progressed. Small chronic infarct right occipital lobe also with progression. No acute infarct in the brainstem or cerebellum. Ventricle size and cerebral volume normal. Chronic microvascular ischemic changes in the white matter. Prominent chronic ischemic changes for age in the pons bilaterally. Negative for hemorrhage or mass. Image quality degraded by motion on some sequences. Vascular: Normal arterial flow voids. Skull and upper cervical  spine: No focal skeletal lesion. Sinuses/Orbits: Paranasal sinuses clear.  Negative orbit Other: None IMPRESSION: Progression of acute infarcts compared with 1 month ago. Extensive watershed infarction bilaterally right greater than left. Chronic infarcts in the occipital lobes left greater than right also with progression. Chronic microvascular ischemic change in the white matter and pons, relatively advanced for age. Electronically Signed   By: Franchot Gallo M.D.   On: 11/20/2019 18:57    PHYSICAL EXAM Pleasant obese middle-aged Caucasian lady not in distress. . Afebrile. Head is nontraumatic. Neck is supple without bruit.    Cardiac exam no murmur or gallop. Lungs are clear to auscultation. Distal pulses are well felt. Neurological Exam :  She is awake alert oriented to place and person.  Speech is slow and hesitant and nonfluent.  Able to name repeat and comprehend well.  No dysarthria.  Extraocular movements appear full range.  Right gaze  preference but is able to look to the left past midline.  Left lower facial weakness.  Tongue midline motor system exam shows mild left hemiparesis with subtle left upper and lower extremity drift with significant weakness of left grip and intrinsic hand muscles.  Orbits right over left upper extremity.  Intact touch pinprick sensation with slight inattention to the left on simultaneous testing.  Coordination slightly impaired on the left compared to the right.  Gait not tested.  ASSESSMENT/PLAN [name] is a 54 y.o. female with history of cryptogenic stroke, obesity, hypertension, hyperlipidemia, diabetes, ICA stenosis recently seen for dizziness and weakness. Now presenting with confusion and L sided weakness.   Stroke:   R>L watershed infarcts in setting of B ICA stenosis, now felt to be large vessel disease source  CT head No acute abnormality. Small vessel disease.   CTA head & neck no LVO. Critical B ICA narrowing, R petrous now 60%. Chronic severe L V4 stenosis. Severe R A1 narrowing. Advanced intracranial atherosclerosis. L subclavian stenosis 60%.  CT perfusion R>L watershed ischemia.   MRI  Progression of R>L watershed infarcts. Chronic L>R occipital infarcts. Small vessel disease in white matter and pons.   LDL 113  HgbA1c 7.4  Loop recorder neg for AF last admission  VTE prophylaxis - Lovenox 40 mg sq daily   aspirin 81 mg daily and Brilinta (ticagrelor) 90 mg bid prior to admission, now on aspirin 81 mg daily and clopidogrel 75 mg daily (changed from Brilinta given risk bleeding as per VVS )  Therapy recommendations:  CIR  Disposition:  pending   Bilateral Carotid stenosis  L ICA 80-99% stenosis   R petrous ICA at 60% (previous CD with similar results)  VVS consulted ([place])  Plans Tcar this week on L. Plans for eventual on the the R  Referred to VVS during 12/2018 stay (see below)  Hypertension  Stable . Permissive hypertension (OK if < 220/120)  but gradually normalize in 5-7 days . Long-term BP goal normotensive  Hyperlipidemia  Home meds:  No statin listed in home meds though d/c in July on Lipitor 80   Now on lipitor 80  LDL 113, goal < 70  Continue statin at discharge  Diabetes type II Uncontrolled  HgbA1c 7.4, goal < 7.0  Other Stroke Risk Factors  Cigarette smoker, advised to stop smoking  Obesity, There is no height or weight on file to calculate BMI., recommend weight loss, diet and exercise as appropriate   Hx stroke/TIA  09/25/2019 - multifocal infarcts felt to be embolic  77/8242 -  L occipital infarct in setting of urosepsis - infarct embolic secondary to unknown source, suspicious for endocarditis. If negative, need to look for AF.Marland Kitchen Carotid Doppler  R 50-79%, L 40-59% stenosis. R ICA stenosis not felt to be related to current infarct. VVS consulted - Dr. Leonie Man discussed with Dr. Donzetta Matters - will follow as an OP  Family hx stroke (grandmother)  Obstructive sleep apnea  Migraines  Hx Moderate aorto bi-iliac atherosclerotic disease  Other Active Problems  Chronic mesenteric ischemia  NAGMA  Chronic UTI  Recurrent kidney stones  CKD stage III  Non-anion gap metabolic acidosis  macrocytosis  Hospital day # 1 She presented last month with bilateral carotid stenosis and left occipital infarct in the setting of urosepsis and plans were to do elective revascularization by vascular surgery but now clearly has presented with watershed infarcts and will need revascularization during the current admission.  Agree with vascular surgery plan to consider right carotid revascularization with TCAR first and then electively 2 weeks later on the left side.  Avoid hypotension and slight permissive hypertension.  Aggressive risk factor modification.  Continue aspirin and Plavix.  Greater than 50% time during this 35-minute visit were spent on counseling and coordination of care about her strokes and symptomatic carotid  stenosis and answering questions.  Discussed with Dr. Oneida Alar vascular surgeon Antony Contras, MD Medical Director [place] Pager: [phone] 10/24/2019 6:00 PM  To contact Stroke Continuity provider, please refer to [email or web address] After hours, contact General Neurology

## 2019-10-24 NOTE — Evaluation (Signed)
Clinical/Bedside Swallow Evaluation Patient Details  Name: Pamela Carney MRN: [id number] Date of Birth: [birth year]  Today's Date: 10/24/2019 Time: SLP Start Time (ACUTE ONLY): 5784 SLP Stop Time (ACUTE ONLY): 0920 SLP Time Calculation (min) (ACUTE ONLY): 17 min  Past Medical History:  Past Medical History:  Diagnosis Date  . Abnormal Pap smear   . AKI (acute kidney injury) ([place]) 12/28/2017  . Anxiety   . Arthritis    bil knees, neck  . Bipolar 1 disorder ([place])   . Cataract    Mild  . Cholelithiasis 12/23/2017   noted on CT renal, pt unaware  . Chronic kidney disease (CKD), stage III (moderate)   . Cystitis   . Depression   . Diabetes mellitus without complication ([place])    type 2  . Genital warts    Hx of genital  . GERD (gastroesophageal reflux disease)   . Heart murmur    childhood  . Hematuria   . History of blood transfusion   . History of ectopic pregnancy   . History of gestational diabetes   . History of kidney stones   . History of sepsis    after ectopic pregnancy  . Hyperlipidemia   . Hypertension   . Idiopathic peripheral neuropathy    both feet  . Leg ulcer, left ([place])   . Low back pain   . Migraines   . Obesity   . Oral candidiasis   . Pneumonia   . PONV (postoperative nausea and vomiting)    prolonged sedation  . Recurrent UTI   . 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 Sigmoid diverticulosis 12/23/2017   noted on CT renal, pt unaware  . Sleep apnea    uses cpap  . Stroke [facility])    Cryptogenic  . Ulcer of foot ([place])    Left  . Weakness    Past Surgical History:  Past Surgical History:  Procedure Laterality Date  . BUBBLE STUDY  12/23/2018   Procedure: BUBBLE STUDY;  Surgeon: Lelon Perla, MD;  Location: [facility] ENDOSCOPY;  Service: Cardiovascular;;  . CERVICAL CONE BIOPSY    . CESAREAN SECTION     x3  . CYSTOSCOPY W/ URETERAL STENT PLACEMENT Right  12/15/2018   Procedure: Cystoscopy, right retrograde ureteropyelogram, fluoroscopic interpretation, right double-J stent placement (24 cm x 6 [place]);  Surgeon: Franchot Gallo, MD;  Location: [facility];  Service: Urology;  Laterality: Right;  . CYSTOSCOPY WITH RETROGRADE PYELOGRAM, URETEROSCOPY AND STENT PLACEMENT Right 01/27/2019   Procedure: CYSTOSCOPY WITH RETROGRADE PYELOGRAM, URETEROSCOPY AND STENT PLACEMENT; BASKET STONE RETRIEVAL;  Surgeon: Alexis Frock, MD;  Location: [facility] ORS;  Service: Urology;  Laterality: Right;  75 MINS  . CYSTOSCOPY/URETEROSCOPY/HOLMIUM LASER/STENT PLACEMENT Right 02/04/2018   Procedure: CYSTOSCOPY/URETEROSCOPY/RETROGRADE PYELOGRAM/HOLMIUM LASER/STENT PLACEMENT;  Surgeon: Alexis Frock, MD;  Location: [facility] ORS;  Service: Urology;  Laterality: Right;  . CYSTOSCOPY/URETEROSCOPY/HOLMIUM LASER/STENT PLACEMENT Right 02/07/2018   Procedure: CYSTOSCOPY LEFT STENT EXCHANGE;  Surgeon: Alexis Frock, MD;  Location: [facility] ORS;  Service: Urology;  Laterality: Right;  . DILATION AND CURETTAGE OF UTERUS    . ECTOPIC PREGNANCY SURGERY    . IR NEPHROSTOMY PLACEMENT LEFT  12/25/2017  . LOOP RECORDER INSERTION N/A 12/23/2018   Procedure: LOOP RECORDER INSERTION;  Surgeon: Constance Haw, MD;  Location: [facility] LAB;  Service: Cardiovascular;  Laterality: N/A;  . NEPHROLITHOTOMY Left 02/04/2018   Procedure: NEPHROLITHOTOMY PERCUTANEOUS;  Surgeon: Tresa Moore,  Hubbard Robinson, MD;  Location: [facility] OR[facility];  Service: Urology;  Laterality: Left;  3 HRS  . NEPHROLITHOTOMY Left 02/07/2018   Procedure: SECOND LOOK NEPHROLITHOTOMY PERCUTANEOUS;  Surgeon: Alexis Frock, MD;  Location: [facility];  Service: Urology;  Laterality: Left;  2 HRS  . OVARIAN CYST REMOVAL    . TEE WITHOUT CARDIOVERSION N/A 12/23/2018   Procedure: TRANSESOPHAGEAL ECHOCARDIOGRAM (TEE);  Surgeon: Lelon Perla, MD;  Location: [facility] ENDOSCOPY;  Service: Cardiovascular;  Laterality: N/A;  . UNILATERAL SALPINGECTOMY     Pt unsure of  which fallopian tube was removed  . WISDOM TOOTH EXTRACTION     HPI:  54 year old woman with medical history significant for prior cryptogenic stroke (failed aspirin and Plavix), diabetes mellitus, hyperlipidemia, CKD stage III, hypertension, bipolar 1 disorder, chronic mesenteric ischemia who presented to the hospital with worsening left-sided weakness. MRI of brain Progression of acute infarcts compared with 1 month ago. Extensive watershed infarction bilaterally right greater than left. Chronic   Assessment / Plan / Recommendation Clinical Impression  Pt presents with a suspected grossly intact oropharyngeal swallow. Baseline upper endentulous status exhibited with some missing lower dentition resulting in prolonged mastication and delayed oral transit of solids. With extended time, pt able to achieve adequate oral clearance. No overt s/sx of aspiration including with 3 oz water challenge. Recommend regular thin liquid diet with standard aspiration precautions. Consider oral rinse for suspected thrush as patchy white lingual coating exhibited, not clearing with oral care by SLP. RN notified.    SLP Visit Diagnosis: Dysphagia, unspecified (R13.10)    Aspiration Risk  Mild aspiration risk    Diet Recommendation   Regular thin liquids  Medication Administration: Whole meds with liquid    Other  Recommendations Oral Care Recommendations: Oral care BID   Follow up Recommendations 24 hour supervision/assistance      Frequency and Duration N/A      Prognosis Prognosis for Safe Diet Advancement: Good      Swallow Study   General Date of Onset: 11/05/2019 HPI: 54 year old woman with medical history significant for prior cryptogenic stroke (failed aspirin and Plavix), diabetes mellitus, hyperlipidemia, CKD stage III, hypertension, bipolar 1 disorder, chronic mesenteric ischemia who presented to the hospital with worsening left-sided weakness. MRI of brain Progression of acute infarcts  compared with 1 month ago. Extensive watershed infarction bilaterally right greater than left. Chronic Type of Study: Bedside Swallow Evaluation Previous Swallow Assessment: prior CSE's unremarkable Diet Prior to this Study: NPO Temperature Spikes Noted: No Respiratory Status: Room air History of Recent Intubation: No Behavior/Cognition: Alert;Distractible;Cooperative Oral Cavity Assessment: Dry Oral Care Completed by SLP: Yes Oral Cavity - Dentition: Missing dentition;Poor condition (edentulous upper) Vision: Functional for self-feeding Self-Feeding Abilities: Needs set up Patient Positioning: Upright in bed Baseline Vocal Quality: Low vocal intensity Volitional Swallow: Able to elicit    Oral/Motor/Sensory Function Overall Oral Motor/Sensory Function: Generalized oral weakness   Ice Chips Ice chips: Not tested   Thin Liquid Thin Liquid: Impaired Presentation: Cup;Straw Oral Phase Impairments: Reduced lingual movement/coordination Oral Phase Functional Implications: Prolonged oral transit Pharyngeal  Phase Impairments: Suspected delayed Swallow;Multiple swallows    Nectar Thick Nectar Thick Liquid: Not tested   Honey Thick Honey Thick Liquid: Not tested   Puree Puree: Within functional limits   Solid     Solid: Impaired Oral Phase Impairments: Impaired mastication;Reduced lingual movement/coordination Oral Phase Functional Implications: Prolonged oral transit Pharyngeal Phase Impairments: Suspected delayed Swallow;Multiple swallows      Cleaster Shiffer E Libbey Duce MA, CCC-SLP Acute Rehabilitation  Services  10/24/2019,9:27 AM

## 2019-10-24 NOTE — Evaluation (Addendum)
Occupational Therapy Evaluation Patient Details Name: Pamela Carney MRN: [id number] DOB: 04-27-1965 Today's Date: 10/24/2019    History of Present Illness Pt is a 54 y.o. female s/p dizziness, weakness, diarrhea. MRI: b/l posterior cortical and subcortical multifocal ischemic infarcts; remote L PCA and L BG infarcts. PMHx DM, CKD, HTN, HLD that vascular surgery has been consulted for high-grade left carotid stenosis with non emergent need for surgery at this time, cryptogenic stroke in 11/2018 with MRI demonstrating L medial occipital lobe infarct. 8/2 MRI: Progression of acute infarcts compared with 1 month ago. Extensive watershed infarction bilaterally R greater than L. Chronic infarcts in the occipital lobes L greater than R also with progression.    Clinical Impression   Pt limited historian with confusion and unable to confirm PLOF and home set up. According to chart review, PTA pt lived with spouse and was independent with ADLs. Pt A&Ox2 disoriented needing cues for time/place and unsure of situation. Pt needing max multimodal cues for attention to task, initiation, sequencing, and attending to L side. Pt with impaired vision in L field and unable to track to L side. Requires set up - Total A with for ADLs due to decreased cognition and decreased coordination, strength, and awareness on L side. Requires Min A with transfers due to decreased coordination and multimodal cues for sequencing and moving L leg. Pt +1 for transfers but would benefit from +2 with gait progression. Believe pt would benefit from skilled OT services acutely and at the CIR level to increase independence and participation in ADLs.    Follow Up Recommendations  CIR    Equipment Recommendations  Other (comment) (TBD at next venue of care)    Recommendations for Other Services Rehab consult     Precautions / Restrictions Precautions Precautions: Fall Precaution Comments: pt with increased confusion and weakness in  LLE      Mobility Bed Mobility Overal bed mobility: Needs Assistance Bed Mobility: Supine to Sit     Supine to sit: Max assist     General bed mobility comments: Max A to scoot L hip forward with pad to EOB  Transfers Overall transfer level: Needs assistance Equipment used: 2 person hand held assist Transfers: Sit to/from [facility] Sit to Stand: Min assist Stand pivot transfers: Min assist       General transfer comment: Min A with multimodal cues for seqeuncing and coordination    Balance Overall balance assessment: Needs assistance Sitting-balance support: Bilateral upper extremity supported Sitting balance-Leahy Scale: Fair Sitting balance - Comments: min guard   Standing balance support: Bilateral upper extremity supported;During functional activity Standing balance-Leahy Scale: Poor Standing balance comment: reliant on BUE support                           ADL either performed or assessed with clinical judgement   ADL Overall ADL's : Needs assistance/impaired     Grooming: Sitting;Cueing for sequencing;Set up   Upper Body Bathing: Sitting;Cueing for sequencing;Cueing for safety;Moderate assistance   Lower Body Bathing: Maximal assistance;Sitting/lateral leans;Cueing for sequencing;Cueing for safety   Upper Body Dressing : Moderate assistance;Sitting;Cueing for sequencing;Cueing for safety   Lower Body Dressing: Sit to/from stand;Sitting/lateral leans;Total assistance;Cueing for sequencing;Cueing for safety Lower Body Dressing Details (indicate cue type and reason): reliance on BUE support Toilet Transfer: Minimal assistance;BSC;Stand-pivot;Cueing for sequencing;Cueing for safety Toilet Transfer Details (indicate cue type and reason): Pt able to transfer to [facility] with Min A and max  verbal cues for sequencing and to move BLE's Toileting- Clothing Manipulation and Hygiene: Total assistance;Sit to/from stand;Cueing for sequencing;Cueing  for safety Toileting - Clothing Manipulation Details (indicate cue type and reason): Total A for perineal care due to relaince on BUEs   Tub/Shower Transfer Details (indicate cue type and reason): deferred due to pt safety Functional mobility during ADLs: Minimal assistance;Cueing for safety;Cueing for sequencing General ADL Comments: Pt with decreased cognition and decreased coordination, strength, and awareness on L side     Vision Patient Visual Report: No change from baseline Vision Assessment?: Yes Eye Alignment: Within Functional Limits Ocular Range of Motion: Restricted on the left Alignment/Gaze Preference: Gaze left Tracking/Visual Pursuits: Decreased smoothness of horizontal tracking;Other (comment) (L side tracking) Visual Fields: Left visual field deficit Additional Comments: Pt unable to look to L side - needing to turn head and cannot attend to side for more than a few seconds at a time            Pertinent Vitals/Pain Pain Assessment: Faces Faces Pain Scale: Hurts a little bit Pain Location: LLE when sitting on BSC Pain Descriptors / Indicators: Discomfort;Grimacing Pain Intervention(s): Limited activity within patient's tolerance;Monitored during session;Repositioned     Hand Dominance Left   Extremity/Trunk Assessment Upper Extremity Assessment Upper Extremity Assessment: LUE deficits/detail LUE Deficits / Details: 2+/5 in shoulder flexion against gravity but able to obtain full ROM with assist LUE Sensation: decreased proprioception LUE Coordination: decreased gross motor;decreased fine motor   Lower Extremity Assessment Lower Extremity Assessment: Defer to PT evaluation   Cervical / Trunk Assessment Cervical / Trunk Assessment: Normal   Communication Communication Communication: Expressive difficulties (difficulties with word finding)   Cognition Arousal/Alertness: Awake/alert Behavior During Therapy: Flat affect Overall Cognitive Status:  Impaired/Different from baseline Area of Impairment: Orientation;Attention;Memory;Following commands;Safety/judgement;Awareness;Problem solving                 Orientation Level: Disoriented to;Time;Situation Current Attention Level: Focused Memory: Decreased recall of precautions;Decreased short-term memory Following Commands: Follows one step commands with increased time Safety/Judgement: Decreased awareness of safety;Decreased awareness of deficits Awareness: Intellectual Problem Solving: Slow processing;Decreased initiation;Difficulty sequencing;Requires verbal cues;Requires tactile cues General Comments: Pt with condusion unsure why she feels bad/in pain and could not describe where or what. Pt with urinary incontinence with transfer and unsure if she would need to go again. Pt has slow processing and decreased initiation needing multimodal cues for sequencing   General Comments  Pt limited historian and unable to answer complex questions. inconsistent with yes and no             Home Living Family/patient expects to be discharged to:: Private residence Living Arrangements: Spouse/significant other Available Help at Discharge: Family;Available 24 hours/day Type of Home: House Home Access: Stairs to enter [employer] of Steps: 3 Entrance Stairs-Rails: None Home Layout: Two level Alternate Level Stairs-Number of Steps: flight (has been sleeping on couch downstairs and using full bath downstairs   Bathroom Shower/Tub: Teacher, early years/pre: Standard     Home Equipment: Latina Craver - 2 wheels;Shower seat   Additional Comments: Home set up and support gathered from chart review. Pt limited historian and unable to answer questions about home set up and PLOF      Prior Functioning/Environment          Comments: Unsure - pt limited historian and unable to report PLOF        OT Problem List: Decreased strength;Decreased range of  motion;Decreased activity tolerance;Impaired  balance (sitting and/or standing);Impaired vision/perception;Decreased coordination;Decreased cognition;Decreased safety awareness;Decreased knowledge of use of DME or AE;Decreased knowledge of precautions;Obesity;Impaired UE functional use      OT Treatment/Interventions: Self-care/ADL training;Neuromuscular education;Energy conservation;DME and/or AE instruction;Manual therapy;Therapeutic activities;Cognitive remediation/compensation;Visual/perceptual remediation/compensation;Patient/family education;Balance training    OT Goals(Current goals can be found in the care plan section) Acute Rehab OT Goals Patient Stated Goal: get better OT Goal Formulation: With patient Time For Goal Achievement: 11/07/19 Potential to Achieve Goals: Good  OT Frequency: Min 2X/week           Co-evaluation PT/OT/SLP Co-Evaluation/Treatment: Yes Reason for Co-Treatment: Complexity of the patient's impairments (multi-system involvement);Necessary to address cognition/behavior during functional activity;For patient/therapist safety;To address functional/ADL transfers   OT goals addressed during session: ADL's and self-care;Proper use of Adaptive equipment and DME;Strengthening/ROM      AM-PAC OT "6 Clicks" Daily Activity     Outcome Measure Help from another person eating meals?: A Little Help from another person taking care of personal grooming?: A Little Help from another person toileting, which includes using toliet, bedpan, or urinal?: Total Help from another person bathing (including washing, rinsing, drying)?: A Lot Help from another person to put on and taking off regular upper body clothing?: A Lot Help from another person to put on and taking off regular lower body clothing?: Total 6 Click Score: 12   End of Session Equipment Utilized During Treatment: Gait belt Nurse Communication: Mobility status  Activity Tolerance: Patient tolerated treatment  well Patient left: in chair;with call bell/phone within reach;with chair alarm set  OT Visit Diagnosis: Unsteadiness on feet (R26.81);Other abnormalities of gait and mobility (R26.89);Muscle weakness (generalized) (M62.81);Other symptoms and signs involving the nervous system (R29.898);Other symptoms and signs involving cognitive function;Hemiplegia and hemiparesis Hemiplegia - Right/Left: Left Hemiplegia - dominant/non-dominant: Dominant                Time: 9292-4462 OT Time Calculation (min): 34 min Charges:  OT General Charges $OT Visit: 1 Visit OT Evaluation $OT Eval Moderate Complexity: 1 Mod  Jannat Rosemeyer/OTS  Tenae Graziosi 10/24/2019, 1:40 PM

## 2019-10-24 NOTE — Progress Notes (Signed)
Subjective:   Overnight, patient became hypotensive so she received 500 cc of LR.  Patient evaluated at bedside this morning. When asked if she understands why she is in the hospital, she states, "I think I had a stroke." When asked if she has discomfort, she says, "I don't know."  She is in no acute distress. However unable to articulate symptoms.    Objective:  Vital signs in last 24 hours: Vitals:   10/24/2019 2006 10/22/2019 2313 10/24/19 0025 10/24/19 0411  BP: (!) 125/104  114/83 122/81  Pulse: (!) 57  70 77  Resp: 18  18 18   Temp: 97.8 F (36.6 C)  98.5 F (36.9 C) 98 F (36.7 C)  TempSrc: Oral Oral Oral Oral  SpO2:   96% 98%   Physical Exam General: Middle-age woman sitting comfortably in chair. No acute distress. Cardiac: RRR.  No murmurs, rubs, or gallops.  No lower extremity edema. Lungs: CTAB. No increased work of breathing. No wheezing. Abdomen: Soft, nontender, nondistended.  Normal bowel sounds. Extremities:  5/5 hand grip strength bilaterally. Slight Left arm drift. 5/5 dorsiflexion and plantar flexion bilateral feet. Neurovascularly intact. Neuro: Oriented to person, place, not year. Right gaze preference. Moves all extremities. Slight droop of left cheek. Speech fluent. Counts forwards and backwards 1-10.   Assessment/Plan:  Principal Problem:   Symptomatic carotid artery stenosis Active Problems:   CKD (chronic kidney disease), stage III   Type 2 diabetes mellitus with other specified complication (HCC)   Bipolar 1 disorder (HCC)   Obesity   Chronic mesenteric ischemia ([place])  Pamela Carney is a 54 year old woman with medical history significant for prior cryptogenic stroke (failed aspirin and Plavix), diabetes mellitus, hyperlipidemia, CKD stage III, hypertension, bipolar 1 disorder, chronic mesenteric ischemia here for management of generalized weakness and altered mental status.  #Left-sided weakness #Symptomatic bilateral carotid stenosis #Watershed  infarct #Acute encephalopathy  Worsening left-sided weakness x4 days with acute encephalopathy. CT angiography head and neck, cerebral perfusion reviews progression of watershed infarcts in the brain, severe arthrosclerosis with R>>L cerebral watershed ischemia, critical bilateral proximal ICA narrowing, chronic CVA left V4 segment stenosis. Symptoms likely secondary to hypoperfusion in the setting of progressive critical ICA stenosis and watershed infarcts. Significantly drop in BP overnight, 500 cc of LR given.  Patient has failed medical therapy multiple times for ICA stenosis.  Vascular surgery planning for tentative right carotic stent on Monday with improvement of mental status. -Vascular surgery following, plan TCAR stenting on 8/9 if patient's mental status improves -Stroke team following, appreciate recs -Frequent neuro checks -Permissive hypertension (systolic <673 and diastolic <419) -Continue aspirin and Plavix -Tele monitoring  -Continue PT/OT/SLP eval --Neg drug screen --Normal B12, folate  #Chronic mesenteric ischemia #NAGMA History of chronic diarrhea found to have chronic mesenteric ischemia on CT abdomen and pelvis during her recent admission. -Continur IVF to avoid hypotension -Follow-up GI outpatient  #Hypertension Avoiding antihypertensives to allow for appropriate perfusion of brain.Patient given 500 cc LR after drop in BP overnight --Daily vitals --Hold antihypertensives  #Type II ischemia Troponin of 174, EKG without ischemic changes. -Repeat trop trending down (167)  #Type 2 diabetes mellitus -SSI every 4 hour in anticipation for possible vascular intervention  #Hyperlipidemia -Continue Lipitor  #Chronic UTI #Recurrent kidney stones  UA shows pyuria, bacteriuria and positive leukocytes. Patient remains afebrile without leukocytosis. Has hx of persistent pyuria, bacteriuria, leukocyte since 2012. She remains asymptomatic. Urine is alkalotic. 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 No dysuria or hematuria. Remains aferile -S/p ceftriaxone x 1 dose -Urine culture contaminated -Consider urology evaluation for cystoscopy  #CKD stage III-stable sCr 1.2 (baseline 1.4) -Continue to monitor with BMP  #Non-anion gap metabolic acidosis --Resolved  #Macrocytosis --Resolved --Normal K93, folic acid  FEN: N.p.o., LR VTE ppx: Lovenox CODE STATUS: Full code  Prior to Admission Living Arrangement: Home Anticipated Discharge Location: Home Barriers to Discharge: Ongoing medical work up Dispo: Anticipated discharge in approximately 7 day(s).   Lacinda Axon, MD 10/24/2019, 7:36 AM [phone] Internal Medicine Teaching Service After 5pm on weekdays and 1pm on weekends: On Call pager [phone]

## 2019-10-24 NOTE — Progress Notes (Addendum)
Progress Note    10/24/2019 7:39 AM * No surgery found *  Subjective:  Can't articulate symptoms this morning. Oriented to name and place. In NAD   Vitals:   10/24/19 0025 10/24/19 0411  BP: 114/83 122/81  Pulse: 70 77  Resp: 18 18  Temp: 98.5 F (36.9 C) 98 F (36.7 C)  SpO2: 96% 98%    Physical Exam: Cardiac:  RRR Lungs:  CTAB Extremities:  5/5 hand grip strength bilaterally. She has AROM of right arm. Left arm drift and unable to extend left wrist. 5/5 dorsiflexion and plantar flexion bilateral feet. Neuro: Right gaze preference persists. She opens her mouth on command, but does not stick out her tongue. Face symmetrical. Speech fluent.  CBC    Component Value Date/Time   WBC 9.5 10/24/2019 0326   RBC 4.09 10/24/2019 0326   HGB 13.0 10/24/2019 0326   HGB 15.1 08/28/2019 1441   HCT 40.5 10/24/2019 0326   HCT 46.2 08/28/2019 1441   PLT 286 10/24/2019 0326   PLT 394 08/28/2019 1441   MCV 99.0 10/24/2019 0326   MCV 96 08/28/2019 1441   MCH 31.8 10/24/2019 0326   MCHC 32.1 10/24/2019 0326   RDW 13.2 10/24/2019 0326   RDW 14.5 08/28/2019 1441   LYMPHSABS 2.1 10/30/2019 1009   LYMPHSABS 2.8 10/05/2013 1532   MONOABS 0.4 11/12/2019 1009   EOSABS 0.1 10/22/2019 1009   EOSABS 0.1 10/05/2013 1532   BASOSABS 0.0 10/30/2019 1009   BASOSABS 0.0 10/05/2013 1532    BMET    Component Value Date/Time   NA 143 10/24/2019 0326   NA 142 08/28/2019 1441   K 3.3 (L) 10/24/2019 0326   CL 106 10/24/2019 0326   CO2 22 10/24/2019 0326   GLUCOSE 114 (H) 10/24/2019 0326   BUN 19 10/24/2019 0326   BUN 29 (H) 08/28/2019 1441   CREATININE 1.11 (H) 10/24/2019 0326   CALCIUM 9.7 10/24/2019 0326   GFRNONAA 57 (L) 10/24/2019 0326   GFRAA >60 10/24/2019 0326     Intake/Output Summary (Last 24 hours) at 10/24/2019 0739 Last data filed at 10/24/2019 0619 Gross per 24 hour  Intake 1350 ml  Output 500 ml  Net 850 ml    HOSPITAL MEDICATIONS Scheduled Meds: . aspirin  81 mg  Oral Daily  . atorvastatin  80 mg Oral q1800  . clopidogrel  75 mg Oral Daily  . enoxaparin (LOVENOX) injection  40 mg Subcutaneous Q24H  . insulin aspart  0-15 Units Subcutaneous Q4H   Continuous Infusions: . lactated ringers 100 mL/hr at 10/24/19 0536  . potassium chloride     PRN Meds:.  Assessment: Bilateral severe carotid stenosis (>80% ICA stenosis) with watershed ischemia right worse than left and findings of  recurrent stroke despite medical management. VSS.   Plan: -Continue Plavix.  Continue observation of clinical status with plans for likely right TCAR followed by left TCAR. -DVT prophylaxis:  Lovenox   Risa Grill, PA-C [facility] [phone] 10/24/2019  7:39 AM   Agree with above.  Will review images today with [facility] for possible TCAR if she makes reasonable recovery.  Tentatively right carotid stent on Monday.  Ruta Hinds, MD [facility] [place] Office: [phone]   Addendum 2pm: CTA reviewed with [facility] and d/w Dr Leonie Man.  I feel she would be candidate for right side TCAR plan for this on Monday if she continues to recover.  Ruta Hinds, MD [facility]  Office: [phone]

## 2019-10-25 LAB — CUP PACEART REMOTE DEVICE CHECK
Date Time Interrogation Session: 20210730230333
Implantable Pulse Generator Implant Date: 20201002

## 2019-10-25 LAB — GLUCOSE, CAPILLARY
Glucose-Capillary: 122 mg/dL — ABNORMAL HIGH (ref 70–99)
Glucose-Capillary: 124 mg/dL — ABNORMAL HIGH (ref 70–99)
Glucose-Capillary: 156 mg/dL — ABNORMAL HIGH (ref 70–99)
Glucose-Capillary: 146 mg/dL — ABNORMAL HIGH (ref 70–99)

## 2019-10-25 NOTE — Progress Notes (Signed)
Physical Therapy Treatment Patient Details Name: Pamela Carney MRN: [id number] DOB: 09-09-1965 Today's Date: 10/25/2019    History of Present Illness Pt is a 54 y/o female with a PMH significant for prior cryptogenic stroke, DM, CKD stage III, HTN, bipolar 1 disorder, chronic mesenteric ischemia. She presented to the ED with worsening L sided weakness. MRI on 8/2 reveals progression of acute infarcts compared to 1 month ago. Extensive watershed infarction bilaterally R greated than L. Chronic infarcts in the occipital lobes L greater than R also with progrsesion.     PT Comments    Pt progressing towards physical therapy goals. Pt improved today and easily able to look to the L and maintain her attention to the L for a short time (~1 minute) before eyes drifted back to the right. With mobility pt required a consistent min assist with RW for balance, walker management, and to manage obstacles on the L. CIR remains the optimal d/c disposition at this time. Will continue to follow.    Follow Up Recommendations  CIR     Equipment Recommendations  None recommended by PT    Recommendations for Other Services       Precautions / Restrictions Precautions Precautions: Fall Restrictions Weight Bearing Restrictions: No    Mobility  Bed Mobility Overal bed mobility: Needs Assistance Bed Mobility: Supine to Sit;Rolling;Sit to Supine Rolling: Min assist (rolling R)   Supine to sit: Min assist Sit to supine: Min assist   General bed mobility comments: Assist with the bed pad to complete roll to the R for peri-care. Pt was able to initiate supine>sit but required assist with the bed pad to scoot all the way forward to get her feet on the floor. On return to the bed, Min assist provided to elevate LLE up into bed.   Transfers Overall transfer level: Needs assistance Equipment used: Rolling walker (2 wheeled) Transfers: Sit to/from Stand Sit to Stand: Min assist         General  transfer comment: VC's for hand placement on seated surface for safety. Min assist to power-up to full standing position. Uncontrolled descent back to bed on return sitting.   Ambulation/Gait Ambulation/Gait assistance: Min assist Gait Distance (Feet): 25 Feet Assistive device: Rolling walker (2 wheeled) Gait Pattern/deviations: Shuffle;Trunk flexed;Narrow base of support;Drifts right/left Gait velocity: Decreased Gait velocity interpretation: <1.31 ft/sec, indicative of household ambulator General Gait Details: Moving extremely slow. Drifting L and bumping into obstacles on her L.    Stairs             Wheelchair Mobility    Modified Rankin (Stroke Patients Only) Modified Rankin (Stroke Patients Only) Pre-Morbid Rankin Score: Moderate disability Modified Rankin: Moderately severe disability     Balance Overall balance assessment: Needs assistance Sitting-balance support: Bilateral upper extremity supported Sitting balance-Leahy Scale: Fair Sitting balance - Comments: min guard   Standing balance support: Bilateral upper extremity supported;During functional activity Standing balance-Leahy Scale: Poor Standing balance comment: reliant on BUE support                            Cognition Arousal/Alertness: Awake/alert Behavior During Therapy: Flat affect Overall Cognitive Status: Impaired/Different from baseline Area of Impairment: Orientation;Attention;Memory;Following commands;Safety/judgement;Awareness;Problem solving                 Orientation Level: Disoriented to;Time (Asking what day it is today - oriented x3) Current Attention Level: Sustained Memory: Decreased recall of precautions;Decreased short-term memory Following  Commands: Follows one step commands with increased time (Not able to follow multi-step commands) Safety/Judgement: Decreased awareness of safety;Decreased awareness of deficits Awareness: Intellectual Problem Solving: Slow  processing;Decreased initiation;Difficulty sequencing;Requires verbal cues;Requires tactile cues General Comments: Pt with urinary and bowel incontinence on arrival and not aware that she was sitting in a large amount of diarrhea despite the smell.       Exercises      General Comments        Pertinent Vitals/Pain Pain Assessment: No/denies pain Pain Intervention(s): Monitored during session    Home Living                      Prior Function            PT Goals (current goals can now be found in the care plan section) Acute Rehab PT Goals Patient Stated Goal: get better PT Goal Formulation: Patient unable to participate in goal setting Time For Goal Achievement: 11/07/19 Potential to Achieve Goals: Good Progress towards PT goals: Progressing toward goals    Frequency    Min 4X/week      PT Plan Current plan remains appropriate    Co-evaluation              AM-PAC PT "6 Clicks" Mobility   Outcome Measure  Help needed turning from your back to your side while in a flat bed without using bedrails?: A Little Help needed moving from lying on your back to sitting on the side of a flat bed without using bedrails?: A Little Help needed moving to and from a bed to a chair (including a wheelchair)?: A Little Help needed standing up from a chair using your arms (e.g., wheelchair or bedside chair)?: A Little Help needed to walk in hospital room?: A Little Help needed climbing 3-5 steps with a railing? : A Lot 6 Click Score: 17    End of Session Equipment Utilized During Treatment: Gait belt Activity Tolerance: Patient tolerated treatment well Patient left: in bed;with call bell/phone within reach;with bed alarm set Nurse Communication: Mobility status PT Visit Diagnosis: Hemiplegia and hemiparesis Hemiplegia - Right/Left: Left Hemiplegia - dominant/non-dominant: Non-dominant Hemiplegia - caused by: Cerebral infarction     Time: 1423-1500 PT Time  Calculation (min) (ACUTE ONLY): 37 min  Charges:  $Gait Training: 8-22 mins $Therapeutic Activity: 8-22 mins                     Rolinda Roan, PT, DPT Acute Rehabilitation Services Pager: [phone] Office: [phone]    Thelma Comp 10/25/2019, 3:20 PM

## 2019-10-25 NOTE — Progress Notes (Signed)
STROKE TEAM PROGRESS NOTE   INTERVAL HISTORY   Sitting up in the chair.  Neurologically stable.  No changes.  Vital signs stable.  Vascular surgery plan on doing right carotid revascularization on Monday.  Vitals:   10/24/19 2348 10/25/19 0354 10/25/19 0753 10/25/19 1217  BP: (!) 124/93 138/75 (!) 157/87 (!) 166/85  Pulse: 65 78 68 72  Resp: 18 17 18 18   Temp: 97.6 F (36.4 C) 98.3 F (36.8 C) 98.1 F (36.7 C) 98.1 F (36.7 C)  TempSrc: Axillary Axillary Oral Oral  SpO2: 93% 92% 95% 94%   CBC:  Recent Labs  Lab 10/21/19 0212 10/21/19 0247 11/10/2019 1009 10/25/2019 1009 10/28/2019 1020 10/24/19 0326  WBC 11.9*   < > 9.2  --   --  9.5  NEUTROABS 9.1*  --  6.7  --   --   --   HGB 13.2   < > 13.5   < > 14.6 13.0  HCT 41.4   < > 43.3   < > 43.0 40.5  MCV 99.8   < > 101.4*  --   --  99.0  PLT 321   < > 123*  --   --  286   < > = values in this interval not displayed.   Basic Metabolic Panel:  Recent Labs  Lab 11/09/2019 1009 11/02/2019 1009 11/04/2019 1020 10/24/19 0326  NA 140   < > 143 143  K 5.2*   < > 3.8 3.3*  CL 107   < > 109 106  CO2 18*  --   --  22  GLUCOSE 188*   < > 196* 114*  BUN 22*   < > 26* 19  CREATININE 1.21*   < > 1.20* 1.11*  CALCIUM 9.9  --   --  9.7   < > = values in this interval not displayed.   Lipid Panel:  Recent Labs  Lab 10/24/19 0326  CHOL 200  TRIG 276*  HDL 32*  CHOLHDL 6.3  VLDL 55*  LDLCALC 113*   HgbA1c:  Recent Labs  Lab 10/24/19 0326  HGBA1C 7.4*   Urine Drug Screen:  Recent Labs  Lab 10/28/2019 1242  LABOPIA NONE DETECTED  COCAINSCRNUR NONE DETECTED  LABBENZ NONE DETECTED  AMPHETMU NONE DETECTED  THCU NONE DETECTED  LABBARB NONE DETECTED    Alcohol Level  Recent Labs  Lab 10/26/2019 1009  ETH <10    IMAGING past 24 hours No results found.  PHYSICAL EXAM Pleasant obese middle-aged Caucasian lady not in distress. . Afebrile. Head is nontraumatic. Neck is supple without bruit.    Cardiac exam no murmur or gallop.  Lungs are clear to auscultation. Distal pulses are well felt. Neurological Exam :  She is awake alert oriented to place and person.  Speech is slow and hesitant and nonfluent.  Able to name repeat and comprehend well.  No dysarthria.  Extraocular movements appear full range.  Right gaze preference but is able to look to the left past midline.  Left lower facial weakness.  Tongue midline motor system exam shows mild left hemiparesis with subtle left upper and lower extremity drift with significant weakness of left grip and intrinsic hand muscles.  Orbits right over left upper extremity.  Intact touch pinprick sensation with slight inattention to the left on simultaneous testing.  Coordination slightly impaired on the left compared to the right.  Gait not tested.  ASSESSMENT/PLAN Ms. NALINI ALCARAZ is a 54 y.o. female with history  of cryptogenic stroke, obesity, hypertension, hyperlipidemia, diabetes, ICA stenosis recently seen for dizziness and weakness. Now presenting with confusion and L sided weakness.   Stroke:   R>L watershed infarcts in setting of B ICA stenosis, now felt to be large vessel disease source  CT head No acute abnormality. Small vessel disease.   CTA head & neck no LVO. Critical B ICA narrowing, R petrous now 60%. Chronic severe L V4 stenosis. Severe R A1 narrowing. Advanced intracranial atherosclerosis. L subclavian stenosis 60%.  CT perfusion R>L watershed ischemia.   MRI  Progression of R>L watershed infarcts. Chronic L>R occipital infarcts. Small vessel disease in white matter and pons.   LDL 113  HgbA1c 7.4  Loop recorder neg for AF last admission  VTE prophylaxis - Lovenox 40 mg sq daily   aspirin 81 mg daily and Brilinta (ticagrelor) 90 mg bid prior to admission, now on aspirin 81 mg daily and clopidogrel 75 mg daily (changed from Brilinta given risk bleeding as per VVS )  Therapy recommendations:  CIR  Disposition:  pending   Bilateral Carotid stenosis  L  ICA 80-99% stenosis   R petrous ICA at 60% (previous CD with similar results)  VVS consulted ([place])  Plans Tcar this week on L. Plans for eventual on the the R  Referred to VVS during 12/2018 stay (see below)  Hypertension  Stable . Permissive hypertension (OK if < 220/120) but gradually normalize in 5-7 days . Long-term BP goal normotensive  Hyperlipidemia  Home meds:  No statin listed in home meds though d/c in July on Lipitor 80   Now on lipitor 80  LDL 113, goal < 70  Continue statin at discharge  Diabetes type II Uncontrolled  HgbA1c 7.4, goal < 7.0  Other Stroke Risk Factors  Cigarette smoker, advised to stop smoking  Obesity, There is no height or weight on file to calculate BMI., recommend weight loss, diet and exercise as appropriate   Hx stroke/TIA  09/25/2019 - multifocal infarcts felt to be embolic  16/1096 - L occipital infarct in setting of urosepsis - infarct embolic secondary to unknown source, suspicious for endocarditis. If negative, need to look for AF.Marland Kitchen Carotid Doppler  R 50-79%, L 40-59% stenosis. R ICA stenosis not felt to be related to current infarct. VVS consulted - Dr. Leonie Man discussed with Dr. Donzetta Matters - will follow as an OP  Family hx stroke (grandmother)  Obstructive sleep apnea  Migraines  Hx Moderate aorto bi-iliac atherosclerotic disease  Other Active Problems  Chronic mesenteric ischemia  NAGMA  Chronic UTI  Recurrent kidney stones  CKD stage III  Non-anion gap metabolic acidosis  macrocytosis  Hospital day # 2 She presented last month with bilateral carotid stenosis and left occipital infarct in the setting of urosepsis and plans were to do elective revascularization by vascular surgery but now clearly has presented with watershed infarcts and will need revascularization during the current admission.  Agree with vascular surgery plan to consider right carotid revascularization with TCAR first and then electively 2 weeks  later on the left side.  Avoid hypotension and slight permissive hypertension.  Aggressive risk factor modification.  Continue aspirin and Plavix.  Stroke team will sign off.  Kindly call for questions. Antony Contras, MD Medical Director [facility] Pager: [phone] 10/25/2019 2:58 PM  To contact Stroke Continuity provider, please refer to [email or web address] After hours, contact General Neurology

## 2019-10-25 NOTE — Progress Notes (Signed)
Thank you for consult on Pamela Carney. Note that surgery scheduled for Monday. Please reconsult post procedure if she continues to have rehab needs.

## 2019-10-25 NOTE — Progress Notes (Addendum)
  Progress Note    10/25/2019 7:33 AM * No surgery date entered *  Subjective: No complaints this morning. Oriented to name and place and is able to tell me why she is here. Not in any acute distress.   Vitals:   10/24/19 2348 10/25/19 0354  BP: (!) 124/93 138/75  Pulse: 65 78  Resp: 18 17  Temp: 97.6 F (36.4 C) 98.3 F (36.8 C)  SpO2: 93% 92%   Physical Exam: Cardiac:  regular Lungs: non labored Extremities:  Moves all extremities without deficits. 5/5 grip strength bilaterally. Still unable to extend left wrist Neurologic: alert and oriented to person and place. She is able to follow most commands but slow to respond. Face is symmetric. Tongue is midline. Speech is fluent and coherent.  CBC    Component Value Date/Time   WBC 9.5 10/24/2019 0326   RBC 4.09 10/24/2019 0326   HGB 13.0 10/24/2019 0326   HGB 15.1 08/28/2019 1441   HCT 40.5 10/24/2019 0326   HCT 46.2 08/28/2019 1441   PLT 286 10/24/2019 0326   PLT 394 08/28/2019 1441   MCV 99.0 10/24/2019 0326   MCV 96 08/28/2019 1441   MCH 31.8 10/24/2019 0326   MCHC 32.1 10/24/2019 0326   RDW 13.2 10/24/2019 0326   RDW 14.5 08/28/2019 1441   LYMPHSABS 2.1 11/08/2019 1009   LYMPHSABS 2.8 10/05/2013 1532   MONOABS 0.4 10/26/2019 1009   EOSABS 0.1 11/02/2019 1009   EOSABS 0.1 10/05/2013 1532   BASOSABS 0.0 11/02/2019 1009   BASOSABS 0.0 10/05/2013 1532    BMET    Component Value Date/Time   NA 143 10/24/2019 0326   NA 142 08/28/2019 1441   K 3.3 (L) 10/24/2019 0326   CL 106 10/24/2019 0326   CO2 22 10/24/2019 0326   GLUCOSE 114 (H) 10/24/2019 0326   BUN 19 10/24/2019 0326   BUN 29 (H) 08/28/2019 1441   CREATININE 1.11 (H) 10/24/2019 0326   CALCIUM 9.7 10/24/2019 0326   GFRNONAA 57 (L) 10/24/2019 0326   GFRAA >60 10/24/2019 0326    INR    Component Value Date/Time   INR 1.1 10/29/2019 1143     Intake/Output Summary (Last 24 hours) at 10/25/2019 0733 Last data filed at 10/25/2019 0300 Gross per 24  hour  Intake 2612 ml  Output --  Net 2612 ml     Assessment/Plan:  54 y.o. female with bilateral high grade ICA stenosis with watershed ischemia that is greater on the right then the left with recurrent stroke presenting with LUE weakness.Previously on [place] for medical management. Now on Aspirin and Plavix. Will need to continue these as well as Lipitor.  Seems to be improving neurologically. Tentatively scheduled for staged Right TCAR on Monday followed by Left TCAR in a couple weeks   DVT prophylaxis:  Lovenox   Karoline Caldwell, PA-C [facility] [phone] 10/25/2019 7:33 AM   Agree with above.  Orientation and motor function improving overall.  She is a candidate for right TCAR and interval left TCAR.  She is on the OR schedule for Monday.  Continue asa plavix  Will recheck on Sunday to make sure appropriate for operation.  Ruta Hinds, MD [facility] [place] Office: [phone]

## 2019-10-25 NOTE — Progress Notes (Addendum)
Subjective:   No acute events overnight. This am, patient states she thinks she is getting more confused. Oriented to person, place, year. States she doesn't remember if she spoke with any other physicians this morning. Patient was updated on plan for R ICA stenting on Monday. Denies any pain.   Objective:  Vital signs in last 24 hours: Vitals:   10/24/19 1543 10/24/19 2042 10/24/19 2348 10/25/19 0354  BP: 109/82 110/89 (!) 124/93 138/75  Pulse: 72 96 65 78  Resp: 18 17 18 17   Temp: 98 F (36.7 C) (!) 97.5 F (36.4 C) 97.6 F (36.4 C) 98.3 F (36.8 C)  TempSrc: Oral Axillary Axillary Axillary  SpO2: (!) 89% 94% 93% 92%   Physical Exam General: Pleasant middle aged woman laying in bed. No acute distress. Cardiac: RRR.  No murmurs, rubs, or gallops.  No lower extremity edema. Lungs: CTAB. No increased work of breathing. No wheezing. Abdomen: Soft, nontender, nondistended. Normal bowel sounds. Extremities:  Palpable pulses. Neurovascularly intact. Neuro: Oriented to person, place, year, month. "Every day is August." R-ward gaze preference, does not cross midline to the L. Seems confused with directions to assess gaze. Grip strength 5/5 on the R, 4/5 on the L. L arm droops when both arms extended above her head. Dorsi- and plantarflexion. 7 quick beats of clonus with L foot dorsiflexion, mild clonus on the R. Positive babinski on the L.   Assessment/Plan:  Principal Problem:   Symptomatic carotid artery stenosis Active Problems:   CKD (chronic kidney disease), stage III   Type 2 diabetes mellitus with other specified complication (HCC)   Bipolar 1 disorder (HCC)   Obesity   Chronic mesenteric ischemia ([place])  [name] is a 54 year old woman with medical history significant for prior cryptogenic stroke (failed aspirin and Plavix), diabetes mellitus, hyperlipidemia, CKD stage III, hypertension, bipolar 1 disorder, chronic mesenteric ischemia here for management of generalized  weakness and altered mental status. Pending R TCAR on Monday.   #Left-sided weakness #Symptomatic bilateral carotid stenosis #Watershed infarct #Acute encephalopathy  Worsening left-sided weakness x4 days with acute encephalopathy. CT angiography head and neck, cerebral perfusion reviews progression of watershed infarcts in the brain, severe arthrosclerosis with R>>L cerebral watershed ischemia, critical bilateral proximal ICA narrowing, chronic CVA left V4 segment stenosis. Symptoms likely secondary to hypoperfusion in the setting of progressive critical ICA stenosis and watershed infarcts. Vascular surgery planning for tentative right carotic stent on Monday with improvement of mental status. No acute events overnight. Patient improving neurologically. -Vascular surgery following, tentatively planned Right TCAR stenting on 8/9, followed by Left TCAR in a few weeks. -Stroke team following, appreciate recs -Frequent neuro checks -Continue permissive hypertension (systolic <672 and diastolic <094) -Continue aspirin and Plavix -Tele monitoring  -Continue PT/OT/SLP eval  #Chronic mesenteric ischemia #NAGMA History of chronic diarrhea found to have chronic mesenteric ischemia on CT abdomen and pelvis during her recent admission. -Continur IVF to avoid hypotension -Follow-up GI outpatient  #Hypertension Avoiding antihypertensives to allow for appropriate perfusion of brain. --Daily vitals --Hold antihypertensives  #Type II ischemia Troponin of 174, EKG without ischemic changes. -Repeat trop trending down (167)  #Type 2 diabetes mellitus -SSI every 4 hour in anticipation for possible vascular intervention  #Hyperlipidemia -Continue Lipitor  #Chronic UTI #Recurrent kidney stones  UA shows pyuria, bacteriuria and positive leukocytes. Patient remains afebrile without leukocytosis. Has hx of persistent pyuria, bacteriuria, leukocyte since 2012. She remains asymptomatic. Urine is  alkalotic. 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 No dysuria or hematuria. Remains aferile -S/p ceftriaxone x 1 dose -Urine culture contaminated -Consider urology evaluation for cystoscopy  #CKD stage III-stable sCr improved to 1.11 (baseline 1.4). Making urine appropriately  #Non-anion gap metabolic acidosis --Resolved  #Macrocytosis --Resolved --Normal B61, folic acid  FEN: N.p.o., LR VTE ppx: Lovenox CODE STATUS: Full code  Prior to Admission Living Arrangement: Home Anticipated Discharge Location: Home Barriers to Discharge: Ongoing medical work up Dispo: Anticipated discharge in approximately 7 day(s).   Lacinda Axon, MD 10/25/2019, 6:31 AM [phone] Internal Medicine Teaching Service After 5pm on weekdays and 1pm on weekends: On Call pager [phone]  DOS 10/25/19:  Internal Medicine Attending:   I saw and examined the patient. I reviewed the resident's note and I agree with the resident's findings and plan as documented in the resident's note.

## 2019-10-26 LAB — GLUCOSE, CAPILLARY
Glucose-Capillary: 144 mg/dL — ABNORMAL HIGH (ref 70–99)
Glucose-Capillary: 155 mg/dL — ABNORMAL HIGH (ref 70–99)
Glucose-Capillary: 157 mg/dL — ABNORMAL HIGH (ref 70–99)
Glucose-Capillary: 196 mg/dL — ABNORMAL HIGH (ref 70–99)
Glucose-Capillary: 211 mg/dL — ABNORMAL HIGH (ref 70–99)

## 2019-10-26 NOTE — Progress Notes (Signed)
Physical Therapy Treatment Patient Details Name: Pamela Carney MRN: [id number] DOB: [birth year] Today's Date: 10/26/2019    History of Present Illness Pt is a 54 y/o female with a PMH significant for prior cryptogenic stroke, DM, CKD stage III, HTN, bipolar 1 disorder, chronic mesenteric ischemia. She presented to the ED with worsening L sided weakness. MRI on 8/2 reveals progression of acute infarcts compared to 1 month ago. Extensive watershed infarction bilaterally R greated than L. Chronic infarcts in the occipital lobes L greater than R also with progrsesion.     PT Comments    Pt in bed upon arrival of PT, agreeable to participate in session with focus on progression of gait and safety with navigation. The pt was able to demo improvements in ambulation in her room, but remains significantly limited by poor safety awareness and deficits in dynamic stability requiring cues and minA to navigate around objects and make adjustments. The pt will continue to benefit from intensive therapies to meet her goal of returning to her prior level of function.   5X Sit-to-Stand: 34 sec (> 11.4 sec indicates increased risk of falls for individuals aged 60-69, > 15 sec indicates increased risk of recurrent falls)     Follow Up Recommendations  CIR     Equipment Recommendations  None recommended by PT (defer to post acute)    Recommendations for Other Services       Precautions / Restrictions Precautions Precautions: Fall Precaution Comments: pt with increased confusion and weakness in LLE Restrictions Weight Bearing Restrictions: No    Mobility  Bed Mobility Overal bed mobility: Needs Assistance Bed Mobility: Supine to Sit;Rolling Rolling: Min guard   Supine to sit: Min assist     General bed mobility comments: pt very slow with all movement, but able to come to long sitting in bed without assist, then minA to facilitate scooting to EOB with bed pad. The pt was able to initiate  movements but is very slow  Transfers Overall transfer level: Needs assistance Equipment used: Rolling walker (2 wheeled) Transfers: Sit to/from Stand Sit to Stand: Min guard         General transfer comment: VC for hand placement and technique, but pt is able to physically rise without assist. poor eccentric lower, improved when cued to use her hands  Ambulation/Gait Ambulation/Gait assistance: Min assist Gait Distance (Feet): 30 Feet Assistive device: Rolling walker (2 wheeled) Gait Pattern/deviations: Shuffle;Trunk flexed;Narrow base of support;Drifts right/left Gait velocity: Decreased Gait velocity interpretation: <1.31 ft/sec, indicative of household ambulator General Gait Details: pt moves very slowly with short shuffle steps (especially with her LLE), running into multiple obstacles on her L despite cues, often needing minA to make adjustments   Stairs             Wheelchair Mobility    Modified Rankin (Stroke Patients Only) Modified Rankin (Stroke Patients Only) Pre-Morbid Rankin Score: Moderate disability Modified Rankin: Moderately severe disability     Balance Overall balance assessment: Needs assistance Sitting-balance support: Bilateral upper extremity supported Sitting balance-Leahy Scale: Fair Sitting balance - Comments: min guard   Standing balance support: Bilateral upper extremity supported;During functional activity Standing balance-Leahy Scale: Poor Standing balance comment: pt able to ambulate in small space without BUE support (walker did not fit in bathroom) but remains seeking UE support. single UE support to brush teeth at sink  Cognition Arousal/Alertness: Awake/alert Behavior During Therapy: Flat affect Overall Cognitive Status: Impaired/Different from baseline Area of Impairment: Orientation;Attention;Memory;Following commands;Safety/judgement;Awareness;Problem solving                  Orientation Level: Disoriented to;Time Current Attention Level: Sustained Memory: Decreased recall of precautions;Decreased short-term memory Following Commands: Follows one step commands with increased time Safety/Judgement: Decreased awareness of safety;Decreased awareness of deficits Awareness: Intellectual Problem Solving: Slow processing;Decreased initiation;Difficulty sequencing;Requires verbal cues;Requires tactile cues General Comments: Pt answering "I don't know" to most questions, decreased self-awareness noted as pt is unaware of pain, if she is finished going to the bathroom. Demos left inattention with inability to correct positioning without cues/minA      Exercises Other Exercises Other Exercises: 5xSTS from recliner with cues for use of hands. 34 sec    General Comments General comments (skin integrity, edema, etc.): Pt aware she is not at her baseline through session asking "I wonder when I will get back to normal" but remains limited historian and responding "I don't know" often      Pertinent Vitals/Pain Pain Assessment: No/denies pain Faces Pain Scale: No hurt (pt reporting "I don't know" when asked about pain, no grimacing or other indicators) Pain Intervention(s): Monitored during session           PT Goals (current goals can now be found in the care plan section) Acute Rehab PT Goals Patient Stated Goal: return to "how I was before" PT Goal Formulation: With patient Time For Goal Achievement: 11/07/19 Potential to Achieve Goals: Good Progress towards PT goals: Progressing toward goals    Frequency    Min 4X/week      PT Plan Current plan remains appropriate       AM-PAC PT "6 Clicks" Mobility   Outcome Measure  Help needed turning from your back to your side while in a flat bed without using bedrails?: A Little Help needed moving from lying on your back to sitting on the side of a flat bed without using bedrails?: A Little Help needed moving  to and from a bed to a chair (including a wheelchair)?: A Little Help needed standing up from a chair using your arms (e.g., wheelchair or bedside chair)?: A Little Help needed to walk in hospital room?: A Little Help needed climbing 3-5 steps with a railing? : A Lot 6 Click Score: 17    End of Session Equipment Utilized During Treatment: Gait belt Activity Tolerance: Patient tolerated treatment well Patient left: in chair;with chair alarm set Nurse Communication: Mobility status PT Visit Diagnosis: Hemiplegia and hemiparesis;Difficulty in walking, not elsewhere classified (R26.2) Hemiplegia - Right/Left: Left Hemiplegia - dominant/non-dominant: Non-dominant Hemiplegia - caused by: Cerebral infarction     Time: 5176-1607 PT Time Calculation (min) (ACUTE ONLY): 31 min  Charges:  $Gait Training: 8-22 mins $Therapeutic Activity: 8-22 mins                     Karma Ganja, PT, DPT   Acute Rehabilitation Department Pager #: [phone]   Otho Bellows 10/26/2019, 8:56 AM

## 2019-10-26 NOTE — Plan of Care (Signed)
  Problem: Education: Goal: Knowledge of General Education information will improve Description Including pain rating scale, medication(s)/side effects and non-pharmacologic comfort measures Outcome: Progressing   Problem: Health Behavior/Discharge Planning: Goal: Ability to manage health-related needs will improve Outcome: Progressing   

## 2019-10-26 NOTE — Progress Notes (Signed)
IP rehab admissions - please see note from Reesa Chew 10/25/19.  I met with patient today and gave her rehab booklets to review.  Patient scheduled for procedure on Monday.  Will follow for progress and rehab needs post procedure.  Call me for questions.  [phone]

## 2019-10-26 NOTE — Progress Notes (Addendum)
Subjective:   Pamela Carney. This AM, patient reports she doesn't feel like her self, and she states, "I just don't like it." Knows she had a stroke and that she is scheduled for surgery Monday. Patient  Worries that she may never feel like herself again. The procedure was explained to her and she was reassured that the procedure should help help her get better. She wants to see her husband and the team informed her that they will give him a call.    Objective:  Vital signs in last 24 hours: Vitals:   10/25/19 1542 10/25/19 2021 10/25/19 2328 10/26/19 0356  BP: (!) 139/95 134/78 (!) 124/100 (!) 157/87  Pulse: 72 72 81 74  Resp: 18 16 16 18   Temp: 97.9 F (36.6 C) 97.8 F (36.6 C) (!) 97.4 F (36.3 C) 97.8 F (36.6 C)  TempSrc: Oral Oral Oral Oral  SpO2: 94% 94% 100% 95%   Physical Exam General: Pleasant middle-aged woman laying in bed. No acute distress. More engaged today. Cardiac: RRR. No murmurs, rubs, or gallops. No lower extremity edema. Lungs: CTAB. No increased work of breathing. No wheezing. Abdomen: Soft, nontender, nondistended. Normal bowel sounds. Extremities:  Palpable pulses. Neurovascularly intact. Neuro: Improved fluency. Oriented to person, year, month, place, season. Still R-ward gaze preference with inability to cross midline to the left. Grip strength 5/5 on right, 4/5 on left. Arms above with improved contracture on the L and R Dorsi- and plantarflexion with inconsistent clonus. Positive babinski on the L.  Assessment/Plan:  Principal Problem:   Symptomatic carotid artery stenosis Active Problems:   CKD (chronic kidney disease), stage III   Type 2 diabetes mellitus with other specified complication (HCC)   Bipolar 1 disorder (HCC)   Obesity   Chronic mesenteric ischemia ([place])  [name] is a 54 year old woman with medical history significant for prior cryptogenic stroke (failed aspirin and Plavix), diabetes mellitus, hyperlipidemia, CKD stage III,  hypertension, bipolar 1 disorder, chronic mesenteric ischemia here for management of generalized weakness and altered mental status. Pending R TCAR on Monday.    #Left-sided weakness #Symptomatic bilateral carotid stenosis #Watershed infarct #Acute encephalopathy due to stroke, improving Worsening left-sided weakness x4 days with acute encephalopathy. CT angiography head and neck, cerebral perfusion reviews progression of watershed infarcts in the brain, severe arthrosclerosis with R>>L cerebral watershed ischemia, critical bilateral proximal ICA narrowing, chronic CVA left V4 segment stenosis. Symptoms likely secondary to hypoperfusion in the setting of progressive critical ICA stenosis and watershed infarcts. Vascular surgery planning for tentative right carotic stent on Monday with improvement of mental status. No acute events overnight. Patient improving neurologically. More engaging today.  -Vascular surgery following, scheduled Right TCAR stenting on 8/9, followed by Left TCAR in 2 weeks. -Stroke team signed off on 8/4 -Frequent neuro checks -Continue slight permissive hypertension (systolic < 035 and diastolic < 009) -Continue aspirin and Plavix -Tele monitoring  -PT/OT recommending CIR --Normal B12/Folate   #Chronic mesenteric ischemia, currently asymptomatic #NAGMA History of chronic diarrhea found to have chronic mesenteric ischemia on CT abdomen and pelvis during her recent admission. -Continur IVF to avoid hypotension -Follow-up GI outpatient   #Hypertension Avoiding antihypertensives to allow for appropriate perfusion of brain. --Daily vitals --Hold antihypertensives   #Type II ischemia, resolved Troponin of 174, EKG without ischemic changes. -Repeat trop trending down (167)   #Type 2 diabetes mellitus Blood sugar in 120s-150s. -Continue SSI every 4 hour   #Hyperlipidemia -Continue Lipitor   #Chronic UTI #Recurrent kidney stones  UA  shows pyuria, bacteriuria and  positive leukocytes. Patient remains afebrile without leukocytosis. Has hx of persistent pyuria, bacteriuria, leukocyte since 2012. She remains asymptomatic. Urine is alkalotic. 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 No dysuria or hematuria. Remains aferile -S/p ceftriaxone x 1 dose -Urine culture contaminated -Consider urology evaluation for cystoscopy  #CKD stage IIIa-stable sCr improved to 1.11 (baseline 1.4). Making urine appropriately     FEN: N.p.o., LR VTE ppx: Lovenox CODE STATUS: Full code   Prior to Admission Living Arrangement: Home Anticipated Discharge Location: Home Barriers to Discharge: Pending R TCAR Dispo: Anticipated discharge in approximately 7 day(s).   Lacinda Axon, MD 10/26/2019, 6:28 AM [phone] Internal Medicine Teaching Service After 5pm on weekdays and 1pm on weekends: On Call pager [phone]  Attending attestation: Internal Medicine Attending:   I saw and examined the patient. I reviewed the resident's note and I agree with the resident's findings and plan as documented in the resident's note.  We are encouraged by the slow but daily improvement in her mental status and exam, today most notably evident in her ability to extend her LUE nearly fully above her head, and her more alert/focused mental status with increased spontaneous speech and ability to respond to questions.  No changes in therapy anticipated while we wait for procedure on 11/19/2019.

## 2019-10-26 NOTE — Progress Notes (Signed)
Carelink Summary Report / Loop Recorder 

## 2019-10-27 LAB — GLUCOSE, CAPILLARY
Glucose-Capillary: 147 mg/dL — ABNORMAL HIGH (ref 70–99)
Glucose-Capillary: 181 mg/dL — ABNORMAL HIGH (ref 70–99)
Glucose-Capillary: 162 mg/dL — ABNORMAL HIGH (ref 70–99)
Glucose-Capillary: 183 mg/dL — ABNORMAL HIGH (ref 70–99)

## 2019-10-27 MED ORDER — INSULIN ASPART 100 UNIT/ML ~~LOC~~ SOLN
0.0000 [IU] | Freq: Three times a day (TID) | SUBCUTANEOUS | Status: DC
  Administered 2019-10-27: 2 [IU] via SUBCUTANEOUS
  Administered 2019-10-27: 1 [IU] via SUBCUTANEOUS
  Administered 2019-10-27: 2 [IU] via SUBCUTANEOUS
  Administered 2019-10-28 (×2): 1 [IU] via SUBCUTANEOUS
  Administered 2019-10-28 – 2019-10-29 (×4): 2 [IU] via SUBCUTANEOUS

## 2019-10-27 MED ORDER — ONDANSETRON HCL 4 MG PO TABS
4.0000 mg | ORAL_TABLET | Freq: Three times a day (TID) | ORAL | Status: DC | PRN
  Administered 2019-10-27: 4 mg via ORAL
  Filled 2019-10-27: qty 1

## 2019-10-27 MED ORDER — INSULIN ASPART 100 UNIT/ML ~~LOC~~ SOLN: 0.0000 [IU] | Freq: Every day | SUBCUTANEOUS | Status: DC

## 2019-10-27 MED ORDER — ONDANSETRON HCL 4 MG PO TABS: 4.0000 mg | ORAL_TABLET | ORAL | Status: DC | PRN

## 2019-10-27 NOTE — Plan of Care (Signed)

## 2019-10-27 NOTE — Progress Notes (Signed)
Occupational Therapy Treatment Patient Details Name: Pamela Carney MRN: [id number] DOB: 1965-12-12 Today's Date: 10/27/2019    History of present illness Pt is a 54 y/o female with a PMH significant for prior cryptogenic stroke, DM, CKD stage III, HTN, bipolar 1 disorder, chronic mesenteric ischemia. She presented to the ED with worsening L sided weakness. MRI on 8/2 reveals progression of acute infarcts compared to 1 month ago. Extensive watershed infarction bilaterally R greated than L. Chronic infarcts in the occipital lobes L greater than R also with progrsesion.    OT comments  Pt. Seen for skilled OT session.  Pts. Family present for session.  Focus of session introduction and use of theraband and squeeze ball as part of HEP for b ue strengthing.  Limited participation secondary to complaints of nausea.  Will finish HEP review next session with introduction of theraputty also.    Follow Up Recommendations  CIR    Equipment Recommendations       Recommendations for Other Services Rehab consult    Precautions / Restrictions Precautions Precautions: Fall       Mobility Bed Mobility                  Transfers                      Balance                                           ADL either performed or assessed with clinical judgement   ADL                                               Vision       Perception     Praxis      Cognition Arousal/Alertness: Lethargic Behavior During Therapy: Flat affect                                            Exercises Other Exercises Other Exercises: provided theraband and began education of use for b ue strengthening.  demonstrated how to secure to bed rail and hold with oposing hand.  pt. able to complete 5 reps of multi direction shoulder and elbow flex. then states she is feeling nausous and needed to stop.  dtr. in law present and requests "squeeze  ball".  reviewed need for grasp and release not just grasp.  they verbalized understanding.   Shoulder Instructions       General Comments      Pertinent Vitals/ Pain       Pain Location: nausea  Home Living                                          Prior Functioning/Environment              Frequency  Min 2X/week        Progress Toward Goals  OT Goals(current goals can now be found in the care plan section)  Progress towards OT goals: Progressing toward  goals     Plan      Co-evaluation                 AM-PAC OT "6 Clicks" Daily Activity     Outcome Measure   Help from another person eating meals?: A Little Help from another person taking care of personal grooming?: A Little Help from another person toileting, which includes using toliet, bedpan, or urinal?: Total Help from another person bathing (including washing, rinsing, drying)?: A Lot Help from another person to put on and taking off regular upper body clothing?: A Lot Help from another person to put on and taking off regular lower body clothing?: Total 6 Click Score: 12    End of Session    OT Visit Diagnosis: Unsteadiness on feet (R26.81);Other abnormalities of gait and mobility (R26.89);Muscle weakness (generalized) (M62.81);Other symptoms and signs involving the nervous system (R29.898);Other symptoms and signs involving cognitive function;Hemiplegia and hemiparesis Hemiplegia - Right/Left: Left Hemiplegia - dominant/non-dominant: Dominant   Activity Tolerance Other (comment) (limited by nausea)   Patient Left in bed;with family/visitor present   Nurse Communication Other (comment) (pt. requesting medicine for nausea)        Time: 2707-8675 OT Time Calculation (min): 9 min  Charges: OT General Charges $OT Visit: 1 Visit OT Treatments $Therapeutic Exercise: 8-22 mins  Sonia Baller, COTA/L Acute Rehabilitation [phone]   Janice Coffin 10/27/2019,  12:35 PM

## 2019-10-27 NOTE — Progress Notes (Signed)
Subjective:   [name] This AM reports she is feeling well, enjoyed her breakfast. Patient agrees that she is getting better. Expresses it was comforting to see her husband yesterday, and she hopes her son will be by today as well. Volunteers that she knows she has her procedures. Feet wiggle with glee when daughter and granddaughter visit. Family reports she has had one major stroke and two mini strokes in the last year. Patient looks much better to them compares to when they brought her in.    Objective:  Vital signs in last 24 hours: Vitals:   10/26/19 1141 10/26/19 1619 10/26/19 2344 10/27/19 0341  BP: (!) 141/85 (!) 155/82 111/86 (!) 138/95  Pulse: (!) 58 66 67 63  Resp: 16 18 18 18   Temp: 98.2 F (36.8 C) 98.4 F (36.9 C) 97.9 F (36.6 C) 98.1 F (36.7 C)  TempSrc: Oral Oral Oral Oral  SpO2: 94% 94% 97% 98%   Physical Exam General: Very pleasant middle-aged woman, cooperative with exam, chuckles appropriately periodically. No acute distress Cardiac: RRR. No murmurs, rubs, or gallops. No lower extremity edema. Lungs: CTAB. No increased work of breathing. No wheezing. Abdomen: Soft, nontender, nondistended. Normal bowel sounds. Extremities:  Palpable pulses. Neurovascularly intact. Skin: No obvious rashes or lesions. Neuro: Oriented to self, place, year, day. Names banana, pen, and TV. Eyes cross midline when looking to the left. Strength 5/5 in RUE, RLE, LUE, and 4/5 in LLE. L and R Dorsi- and plantarflexion with inconsistent clonus, 4-5 beats max, improved from previous day. Babinski on L foot. Sensation intact. Psych: Improved spontaneous speech and response to questions. Normal mood and affect.  Assessment/Plan:  Principal Problem:   Symptomatic carotid artery stenosis Active Problems:   CKD (chronic kidney disease), stage III   Type 2 diabetes mellitus with other specified complication (HCC)   Bipolar 1 disorder (HCC)   Obesity   Chronic mesenteric ischemia  ([place])  [name] is a 54 year old woman with medical history significant for prior cryptogenic stroke (failed aspirin and Plavix), diabetes mellitus, hyperlipidemia, CKD stage III, hypertension, bipolar 1 disorder, chronic mesenteric ischemia here for management of generalized weakness and altered mental status. Pending R TCAR on Monday.   #Left-sided weakness #Symptomatic bilateral carotid stenosis #Watershed infarct #Acute encephalopathy  Worsening left-sided weakness x4 days with acute encephalopathy. CT angiography head and neck, cerebral perfusion reviews progression of watershed infarcts in the brain, severe arthrosclerosis with R>>L cerebral watershed ischemia, critical bilateral proximal ICA narrowing, chronic CVA left V4 segment stenosis. Symptoms likely secondary to hypoperfusion in the setting of progressive critical ICA stenosis and watershed infarcts. Vascular surgery planning for tentative right carotic stent on Monday with improvement of mental status. No acute events overnight. Patient with significant improvement in mental status and nero exam today.  -Vascular surgery following, scheduled Right TCAR stenting on 8/9, followed by Left TCAR in 2 weeks. -Frequent neuro checks -Continue slight permissive hypertension (systolic <341 and diastolic <937) -Continue aspirin and Plavix -CIR s/p Right TCAR --Normal B12/Folate  #Chronic mesenteric ischemia #NAGMA History of chronic diarrhea found to have chronic mesenteric ischemia on CT abdomen and pelvis during her recent admission. -Follow-up GI outpatient  #Hypertension Avoiding antihypertensives to allow for appropriate perfusion of brain. BP stable with sBP range of 120s to 150s. --Daily vitals --Continue to hold antihypertensives  #Type 2 diabetes mellitus Blood sugar stable with current regimen. -Continue SSI every 4 hour  #Hyperlipidemia -Continue Lipitor  #CKD stage III, stable sCr improved to 1.11 (baseline  1.4). Making urine appropriately --BMP this weekend   FEN: N.p.o., LR VTE ppx: Lovenox CODE STATUS: Full code  Prior to Admission Living Arrangement: Home Anticipated Discharge Location: Home Barriers to Discharge: Pending R TCAR Dispo: Anticipated discharge in approximately 3-4 day(s).   Lacinda Axon, MD 10/27/2019, 6:27 AM [phone] Internal Medicine Teaching Service After 5pm on weekdays and 1pm on weekends: On Call pager [phone]

## 2019-10-27 NOTE — Progress Notes (Signed)
Physical Therapy Treatment Patient Details Name: Pamela Carney MRN: [id number] DOB: 08-12-1965 Today's Date: 10/27/2019    History of Present Illness Pt is a 54 y/o female with a PMH significant for prior cryptogenic stroke, DM, CKD stage III, HTN, bipolar 1 disorder, chronic mesenteric ischemia. She presented to the ED with worsening L sided weakness. MRI on 8/2 reveals progression of acute infarcts compared to 1 month ago. Extensive watershed infarction bilaterally R greated than L. Chronic infarcts in the occipital lobes L greater than R also with progrsesion.     PT Comments    Session limited by nausea and vomiting. Pt reports feeling "overheated" and required prolonged seated rest EOB before she vomited. After vomit, pt was able to ambulate to the sink to brush her teeth and then reports she needs to lay back down, continuing to state "I'm overheated". RN aware. Although session limited, still feel pt is appropriate for CIR level therapies at d/c. Will continue to follow.    Follow Up Recommendations  CIR     Equipment Recommendations  None recommended by PT (defer to post acute)    Recommendations for Other Services       Precautions / Restrictions Precautions Precautions: Fall Restrictions Weight Bearing Restrictions: No    Mobility  Bed Mobility Overal bed mobility: Needs Assistance Bed Mobility: Supine to Sit;Sit to Supine     Supine to sit: Min assist Sit to supine: Min assist   General bed mobility comments: Assist to scoot out fully to EOB, and for LE elevation back up into the bed at end of session.   Transfers Overall transfer level: Needs assistance Equipment used: Rolling walker (2 wheeled) Transfers: Sit to/from Stand Sit to Stand: Min guard         General transfer comment: Light guard as pt powered up to full standing position. Increased time required but pt appeared fairly steady.   Ambulation/Gait Ambulation/Gait assistance: Min assist Gait  Distance (Feet): 8 Feet Assistive device: Rolling walker (2 wheeled) Gait Pattern/deviations: Shuffle;Trunk flexed;Narrow base of support;Drifts right/left Gait velocity: Decreased Gait velocity interpretation: <1.31 ft/sec, indicative of household ambulator General Gait Details: pt moves very slowly with short shuffle steps (especially with her LLE), running into multiple obstacles on her L despite cues, often needing minA to make adjustments. Pt only ambulated to the sink and back to the bed to brush her teeth after vomiting.   Stairs             Wheelchair Mobility    Modified Rankin (Stroke Patients Only) Modified Rankin (Stroke Patients Only) Pre-Morbid Rankin Score: Moderate disability Modified Rankin: Moderately severe disability     Balance Overall balance assessment: Needs assistance Sitting-balance support: Bilateral upper extremity supported Sitting balance-Leahy Scale: Fair Sitting balance - Comments: min guard   Standing balance support: Bilateral upper extremity supported;During functional activity Standing balance-Leahy Scale: Poor Standing balance comment: pt able to ambulate in small space without BUE support (walker did not fit in bathroom) but remains seeking UE support. single UE support to brush teeth at sink                            Cognition Arousal/Alertness: Awake/alert Behavior During Therapy: Flat affect Overall Cognitive Status: Impaired/Different from baseline Area of Impairment: Attention;Memory;Following commands;Safety/judgement;Awareness;Problem solving                   Current Attention Level: Sustained Memory: Decreased recall of precautions;Decreased short-term  memory Following Commands: Follows one step commands with increased time Safety/Judgement: Decreased awareness of safety;Decreased awareness of deficits Awareness: Intellectual Problem Solving: Slow processing;Decreased initiation;Difficulty  sequencing;Requires verbal cues;Requires tactile cues        Exercises Other Exercises Other Exercises: provided theraband and began education of use for b ue strengthening.  demonstrated how to secure to bed rail and hold with oposing hand.  pt. able to complete 5 reps of multi direction shoulder and elbow flex. then states she is feeling nausous and needed to stop.  dtr. in law present and requests "squeeze ball".  reviewed need for grasp and release not just grasp.  they verbalized understanding.    General Comments        Pertinent Vitals/Pain Pain Assessment: No/denies pain Faces Pain Scale: No hurt Pain Location: nausea    Home Living                      Prior Function            PT Goals (current goals can now be found in the care plan section) Acute Rehab PT Goals Patient Stated Goal: "feel better" - N/V this session PT Goal Formulation: With patient Time For Goal Achievement: 11/07/19 Potential to Achieve Goals: Good Progress towards PT goals: Progressing toward goals    Frequency    Min 4X/week      PT Plan Current plan remains appropriate    Co-evaluation              AM-PAC PT "6 Clicks" Mobility   Outcome Measure  Help needed turning from your back to your side while in a flat bed without using bedrails?: A Little Help needed moving from lying on your back to sitting on the side of a flat bed without using bedrails?: A Little Help needed moving to and from a bed to a chair (including a wheelchair)?: A Little Help needed standing up from a chair using your arms (e.g., wheelchair or bedside chair)?: A Little Help needed to walk in hospital room?: A Little Help needed climbing 3-5 steps with a railing? : A Lot 6 Click Score: 17    End of Session Equipment Utilized During Treatment: Gait belt Activity Tolerance: Patient tolerated treatment well Patient left: in bed;with bed alarm set;with call bell/phone within reach Nurse  Communication: Mobility status PT Visit Diagnosis: Hemiplegia and hemiparesis;Difficulty in walking, not elsewhere classified (R26.2) Hemiplegia - Right/Left: Left Hemiplegia - dominant/non-dominant: Non-dominant Hemiplegia - caused by: Cerebral infarction     Time: 1022-1055 PT Time Calculation (min) (ACUTE ONLY): 33 min  Charges:  $Gait Training: 8-22 mins $Therapeutic Activity: 8-22 mins                     Rolinda Roan, PT, DPT Acute Rehabilitation Services Pager: [phone] Office: [place] 10/27/2019, 1:47 PM

## 2019-10-28 LAB — GLUCOSE, CAPILLARY
Glucose-Capillary: 139 mg/dL — ABNORMAL HIGH (ref 70–99)
Glucose-Capillary: 135 mg/dL — ABNORMAL HIGH (ref 70–99)
Glucose-Capillary: 191 mg/dL — ABNORMAL HIGH (ref 70–99)
Glucose-Capillary: 157 mg/dL — ABNORMAL HIGH (ref 70–99)

## 2019-10-28 MED ORDER — POLYETHYLENE GLYCOL 3350 17 G PO PACK
17.0000 g | PACK | Freq: Every day | ORAL | Status: DC
  Administered 2019-10-28 – 2019-10-29 (×2): 17 g via ORAL
  Filled 2019-10-28 (×2): qty 1

## 2019-10-28 MED ORDER — LACTATED RINGERS IV BOLUS
500.0000 mL | Freq: Once | INTRAVENOUS | Status: AC
  Administered 2019-10-28: 500 mL via INTRAVENOUS

## 2019-10-28 MED ORDER — SENNA 8.6 MG PO TABS
2.0000 | ORAL_TABLET | Freq: Two times a day (BID) | ORAL | Status: DC
  Administered 2019-10-28 – 2019-10-29 (×3): 17.2 mg via ORAL
  Filled 2019-10-28 (×3): qty 2

## 2019-10-28 NOTE — Progress Notes (Signed)
Subjective:   [name] Pamela Carney was evaluated at the bedside. She states she is feeling better today. Yesterday she had an episode of nausea and vomiting after breakfast, but hasn't had recurrence since. She did not have much of an appetite so she did not eat her dinner. This morning, she has been able to tolerate her breakfast without N/V. She has not had a bowel movement for 4 days and feels like today could be the day she moves her bowel again. She was disappointed that her husband could not come visit her yesterday due to the limited visitation policy.     Objective:  Vital signs in last 24 hours: Vitals:   10/27/19 1947 10/27/19 2346 10/28/19 0337 10/28/19 0833  BP: (!) 131/97 (!) 129/93 114/83 (!) 164/90  Pulse: 62 71 68 61  Resp: 18 16 15 16   Temp: 98.2 F (36.8 C) 97.8 F (36.6 C) 97.8 F (36.6 C) 98.1 F (36.7 C)  TempSrc: Oral Oral Oral Oral  SpO2: 95% 98% 95% 95%   Physical Exam General: Very pleasant middle-aged woman comfortably laying in bed. No acute distress Cardiac: RRR. No murmurs, rubs, or gallops. No lower extremity edema. Lungs: CTAB. No increased work of breathing. No wheezing. Abdomen: Soft, nontender, nondistended. Normal bowel sounds. Extremities:  Palpable pulses. Neurovascularly intact. Skin: No obvious rashes or lesions. Neuro: A&O x3. Strength 5/5 in RUE, RLE, LUE, and 4/5 in LLE. L and R Dorsi- and plantarflexion with inconsistent clonus, 4-5 beats max, similar to previous day. Sensation intact. Psych: Improved fluency. Responds to questions without difficulty. Normal mood and affect.  Assessment/Plan:  Principal Problem:   Symptomatic carotid artery stenosis Active Problems:   CKD (chronic kidney disease), stage III   Type 2 diabetes mellitus with other specified complication (HCC)   Bipolar 1 disorder (HCC)   Obesity   Chronic mesenteric ischemia ([place])  Pamela Carney is a 54 year old woman with medical history significant for prior  cryptogenic stroke (failed aspirin and Plavix), diabetes mellitus, hyperlipidemia, CKD stage III, hypertension, bipolar 1 disorder, chronic mesenteric ischemia here for management of generalized weakness and altered mental status. Pending R TCAR on Monday.   #Left-sided weakness #Symptomatic bilateral carotid stenosis #Watershed infarct #Acute encephalopathy  Worsening left-sided weakness x4 days with acute encephalopathy. CT angiography head and neck, cerebral perfusion reviews progression of watershed infarcts in the brain, severe arthrosclerosis with R>>L cerebral watershed ischemia, critical bilateral proximal ICA narrowing, chronic CVA left V4 segment stenosis. Symptoms likely secondary to hypoperfusion in the setting of progressive critical ICA stenosis and watershed infarcts. Vascular surgery planning for tentative right carotic stent on Monday with improvement of mental status. BP dropped to 114/83 overnight and patient was given 1000 cc LR. BP improved this AM. Patient continues to show improvement in mental status and nero exam. -Vascular surgery following, scheduled Right TCAR stenting on 8/9, followed by Left TCAR in 2 weeks. -Frequent neuro checks -Continue slight permissive hypertension (systolic <268 and diastolic <341) -Continue aspirin and Plavix -CIR s/p Right TCAR --Normal B12/Folate --Pending CMP, CBC  #Nausea Patient report nausea after breakfast yesterday. Improved with prn Zofran. Likely due to patient's lack of BM for the last 4 days.  --Continue Zofran prn  #Chronic mesenteric ischemia #NAGMA History of chronic diarrhea found to have chronic mesenteric ischemia on CT abdomen and pelvis during her recent admission. -Follow-up GI outpatient  #Hypertension Avoiding antihypertensives to allow for appropriate perfusion of brain. BP stable with sBP range of 120s to 150s. --Daily vitals --  Continue to hold antihypertensives  #Type 2 diabetes mellitus Blood sugar  stable with current regimen. -Continue SSI every 4 hour  #Hyperlipidemia -Continue Lipitor  #CKD stage III, stable sCr improved to 1.11 (baseline 1.4). Making urine appropriately --Follow up morning CMP   FEN: N.p.o., LR VTE ppx: Lovenox CODE STATUS: Full code  Prior to Admission Living Arrangement: Home Anticipated Discharge Location: Home Barriers to Discharge: Pending R TCAR Dispo: Anticipated discharge in approximately 2-3 day(s).   Lacinda Axon, MD 10/28/2019, 11:48 AM [phone] Internal Medicine Teaching Service After 5pm on weekdays and 1pm on weekends: On Call pager [phone]

## 2019-10-29 ENCOUNTER — Encounter (HOSPITAL_COMMUNITY): Payer: Self-pay | Admitting: Internal Medicine

## 2019-10-29 LAB — COMPREHENSIVE METABOLIC PANEL
ALT: 15 U/L (ref 0–44)
AST: 18 U/L (ref 15–41)
Albumin: 3.1 g/dL — ABNORMAL LOW (ref 3.5–5.0)
Alkaline Phosphatase: 90 U/L (ref 38–126)
Anion gap: 12 (ref 5–15)
BUN: 23 mg/dL — ABNORMAL HIGH (ref 6–20)
CO2: 22 mmol/L (ref 22–32)
Calcium: 9.6 mg/dL (ref 8.9–10.3)
Chloride: 105 mmol/L (ref 98–111)
Creatinine, Ser: 1.23 mg/dL — ABNORMAL HIGH (ref 0.44–1.00)
GFR calc Af Amer: 58 mL/min — ABNORMAL LOW (ref >60)
GFR calc non Af Amer: 50 mL/min — ABNORMAL LOW (ref >60)
Glucose, Bld: 116 mg/dL — ABNORMAL HIGH (ref 70–99)
Potassium: 3.8 mmol/L (ref 3.5–5.1)
Sodium: 139 mmol/L (ref 135–145)
Total Bilirubin: 0.8 mg/dL (ref 0.3–1.2)
Total Protein: 6.1 g/dL — ABNORMAL LOW (ref 6.5–8.1)

## 2019-10-29 LAB — CBC
HCT: 39.8 % (ref 36.0–46.0)
Hemoglobin: 12.6 g/dL (ref 12.0–15.0)
MCH: 31.1 pg (ref 26.0–34.0)
MCHC: 31.7 g/dL (ref 30.0–36.0)
MCV: 98.3 fL (ref 80.0–100.0)
Platelets: 308 10*3/uL (ref 150–400)
RBC: 4.05 MIL/uL (ref 3.87–5.11)
RDW: 12.3 % (ref 11.5–15.5)
WBC: 9.5 10*3/uL (ref 4.0–10.5)
nRBC: 0 % (ref 0.0–0.2)

## 2019-10-29 LAB — MAGNESIUM: Magnesium: 2 mg/dL (ref 1.7–2.4)

## 2019-10-29 LAB — GLUCOSE, CAPILLARY
Glucose-Capillary: 152 mg/dL — ABNORMAL HIGH (ref 70–99)
Glucose-Capillary: 196 mg/dL — ABNORMAL HIGH (ref 70–99)
Glucose-Capillary: 154 mg/dL — ABNORMAL HIGH (ref 70–99)
Glucose-Capillary: 185 mg/dL — ABNORMAL HIGH (ref 70–99)

## 2019-10-29 MED ORDER — SENNOSIDES-DOCUSATE SODIUM 8.6-50 MG PO TABS
2.0000 | ORAL_TABLET | Freq: Two times a day (BID) | ORAL | Status: DC
  Administered 2019-10-29 (×2): 2 via ORAL
  Filled 2019-10-29 (×3): qty 2

## 2019-10-29 MED ORDER — KETOROLAC TROMETHAMINE 30 MG/ML IJ SOLN
30.0000 mg | Freq: Once | INTRAMUSCULAR | Status: AC
  Administered 2019-10-29: 30 mg via INTRAVENOUS
  Filled 2019-10-29: qty 1

## 2019-10-29 MED ORDER — MAGNESIUM HYDROXIDE 400 MG/5ML PO SUSP
15.0000 mL | Freq: Once | ORAL | Status: AC
  Administered 2019-10-29: 15 mL via ORAL
  Filled 2019-10-29: qty 30

## 2019-10-29 MED ORDER — CEFAZOLIN SODIUM-DEXTROSE 2-4 GM/100ML-% IV SOLN
2.0000 g | INTRAVENOUS | Status: AC
  Administered 2019-10-30: 2 g via INTRAVENOUS
  Filled 2019-10-29: qty 100

## 2019-10-29 MED ORDER — CEFAZOLIN SODIUM-DEXTROSE 2-4 GM/100ML-% IV SOLN: 2.0000 g | INTRAVENOUS | Status: DC

## 2019-10-29 MED ORDER — BUTALBITAL-APAP-CAFFEINE 50-325-40 MG PO TABS
1.0000 | ORAL_TABLET | ORAL | Status: DC | PRN
  Administered 2019-10-29: 1 via ORAL
  Filled 2019-10-29: qty 1

## 2019-10-29 NOTE — Progress Notes (Signed)
   Subjective:   Overnight, night team paged regarding bradycardia that was nonsustained and asymptomatic.   Evaluated at bedside this morning. Reports feeling "pretty good." Still has not yet had a BM, has not been out of bed much. Says she would be up for getting out of bed more. Says, "I think I need to poop." No N/V today.   Objective:  Vital signs in last 24 hours: Vitals:   10/28/19 2339 10/29/19 0517 10/29/19 0751 10/29/19 1111  BP: 135/84 126/86 (!) 151/93 (!) 165/83  Pulse: 62 (!) 58 60 62  Resp: 18 18 18 18   Temp: 98.1 F (36.7 C) 98 F (36.7 C) 98.1 F (36.7 C) 98 F (36.7 C)  TempSrc: Oral Oral    SpO2: 94% 97% 93%    Constitutional: Tired-appearing woman sitting upright in bed eating breakfast, alert and cooperative with exam Abd: soft, non-distended  Neurological: Oriented to person, place, and time. Demonstrates ability to perform basic calculations. Able to cross midline when looking to the left. Strength 5/5 in bilateral upper extremities. Sensation intact in bilateral upper extremities. Strength 5/5 in lower extremities. Clonus improving. Psych: Affect is flat   Assessment/Plan:  Principal Problem:   Symptomatic carotid artery stenosis Active Problems:   CKD (chronic kidney disease), stage III   Type 2 diabetes mellitus with other specified complication (HCC)   Bipolar 1 disorder (HCC)   Obesity   Chronic mesenteric ischemia ([place])  Pamela Carney is a 54 year old woman with medical history significant for prior cryptogenic stroke (failed aspirin and Plavix), diabetes mellitus, hyperlipidemia, CKD stage III, hypertension, bipolar 1 disorder, chronic mesenteric ischemia here for management of generalized weakness and altered mental status. Pending R TCAR on Monday.   #Left-sided weakness #Symptomatic bilateral carotid stenosis #Watershed infarct #Acute encephalopathy Worsening left-sided weakness x4 dayswithacute encephalopathy. CT angiography head and  neck, cerebral perfusion reviews progression of watershed infarcts in the brain, severe arthrosclerosis with R>>Lcerebral watershed ischemia, critical bilateral proximal ICA narrowing, chronic CVA left V4 segment stenosis.Symptoms likely secondary to hypoperfusion in the setting of progressive critical ICA stenosis and watershed infarcts. Vascular surgery planning for  right carotic stent on Monday. Patient continues to show improvement in mental status and nero exam. -Vascular surgery following, scheduled Right TCAR stenting on 8/9, followed by Left TCAR in 2 weeks. -Frequent neuro checks -Continue slight permissive hypertension (systolic <341 and diastolic <962) -Continue aspirin and Plavix -CIR s/p Right TCAR --labs are stable for surgery   #Constipation -has a history of chronic diarrhea in the setting of chronic mesenteric ischemia, but has not had BM since HD#1 -currently on scheduled Miralax and twice daily Senna-S -would prefer to have BM today prior to surgery; will escalate to milk of magnesia if she does not have a BM by this afternoon -ideally need to avoid straining to decrease risk of cerebral hypoperfusion    #Hypertension Avoiding antihypertensives to allow for appropriate perfusion of brain. BP stable with sBP range of 120s to 150s. --Daily vitals --Continue to hold antihypertensives  #Type 2 diabetes mellitus Blood sugar stable with current regimen. -Continue SSI and Lantus 7 units qhs   #Hyperlipidemia -ContinueLipitor  #CKD stage III, stable baseline 1.4; creatine stable for surgery tomorrow   Prior to Admission Living Arrangement: home Anticipated Discharge Location: CIR postoperatively  Barriers to Discharge: continued medical management    Pamela Bison, DO 10/29/2019, 11:52 AM Pager: [phone] After 5pm on weekdays and 1pm on weekends: On Call pager [phone]

## 2019-10-29 NOTE — Progress Notes (Signed)
Tele called on two different occassions, states "Pt's HR went down to low 30s, went back up and sustained in the 40s." Checked Pt and asked "How are you feeling?" Pt states "I am feeling fine"

## 2019-10-29 NOTE — Progress Notes (Signed)
Physical Therapy Treatment Patient Details Name: Pamela Carney MRN: [id number] DOB: July 17, 1965 Today's Date: 10/29/2019    History of Present Illness Pt is a 54 y/o female with a PMH significant for prior cryptogenic stroke, DM, CKD stage III, HTN, bipolar 1 disorder, chronic mesenteric ischemia. She presented to the ED with worsening L sided weakness. MRI on 8/2 reveals progression of acute infarcts compared to 1 month ago. Extensive watershed infarction bilaterally R greated than L. Chronic infarcts in the occipital lobes L greater than R also with progrsesion.     PT Comments    Pt progressing towards physical therapy goals. Continues to report feeling overheated when attempting mobility, but no nausea/vomiting this session. Pt ambulated to the bathroom and back to the recliner only. Feel she is continuing to improve with L sided inattention, however overall will still benefit from multidisciplinary therapies available at [facility]. Will continue to follow.     Follow Up Recommendations  [facility]     Equipment Recommendations  None recommended by PT (defer to post acute)    Recommendations for Other Services       Precautions / Restrictions Precautions Precautions: Fall Restrictions Weight Bearing Restrictions: No    Mobility  Bed Mobility Overal bed mobility: Needs Assistance Bed Mobility: Supine to Sit     Supine to sit: Min guard     General bed mobility comments: Increased time to transition to sitting position and scoot out for feet to touch the floor. Pt required several cues to keep initiating movement.  Transfers Overall transfer level: Needs assistance Equipment used: Rolling walker (2 wheeled);1 person hand held assist Transfers: Sit to/from Stand Sit to Stand: Min guard;Min assist         General transfer comment: Min guard with HHA from EOB and min assist with RW from toilet.   Ambulation/Gait Ambulation/Gait assistance: Min assist Gait Distance (Feet): 25  Feet Assistive device: Rolling walker (2 wheeled) Gait Pattern/deviations: Shuffle;Trunk flexed;Narrow base of support;Drifts right/left Gait velocity: Decreased Gait velocity interpretation: <1.31 ft/sec, indicative of household ambulator General Gait Details: Moving slow and requiring assistance. Pt ambulated in room only. Reports feeling overheated initially after getting up and then again when up from the toilet. HHA to the toilet, and RW back to the chair.    Stairs             Wheelchair Mobility    Modified Rankin (Stroke Patients Only) Modified Rankin (Stroke Patients Only) Pre-Morbid Rankin Score: Moderate disability Modified Rankin: Moderately severe disability     Balance Overall balance assessment: Needs assistance Sitting-balance support: Bilateral upper extremity supported Sitting balance-Leahy Scale: Fair Sitting balance - Comments: min guard   Standing balance support: Bilateral upper extremity supported;During functional activity Standing balance-Leahy Scale: Poor Standing balance comment: pt able to ambulate in small space without BUE support (walker did not fit in bathroom) but remains seeking UE support. single UE support to brush teeth at sink                            Cognition Arousal/Alertness: Awake/alert Behavior During Therapy: Flat affect Overall Cognitive Status: Impaired/Different from baseline Area of Impairment: Attention;Memory;Following commands;Safety/judgement;Awareness;Problem solving                   Current Attention Level: Sustained Memory: Decreased recall of precautions;Decreased short-term memory Following Commands: Follows one step commands with increased time Safety/Judgement: Decreased awareness of safety;Decreased awareness of deficits Awareness: Emergent Problem  Solving: Slow processing;Decreased initiation;Difficulty sequencing;Requires verbal cues;Requires tactile cues        Exercises       General Comments        Pertinent Vitals/Pain Pain Assessment: No/denies pain Faces Pain Scale: No hurt    Home Living                      Prior Function            PT Goals (current goals can now be found in the care plan section) Acute Rehab PT Goals Patient Stated Goal: Eat lunch PT Goal Formulation: With patient Time For Goal Achievement: 11/07/19 Potential to Achieve Goals: Good Progress towards PT goals: Progressing toward goals    Frequency    Min 4X/week      PT Plan Current plan remains appropriate    Co-evaluation              AM-PAC PT "6 Clicks" Mobility   Outcome Measure  Help needed turning from your back to your side while in a flat bed without using bedrails?: A Little Help needed moving from lying on your back to sitting on the side of a flat bed without using bedrails?: A Little Help needed moving to and from a bed to a chair (including a wheelchair)?: A Little Help needed standing up from a chair using your arms (e.g., wheelchair or bedside chair)?: A Little Help needed to walk in hospital room?: A Little Help needed climbing 3-5 steps with a railing? : A Lot 6 Click Score: 17    End of Session Equipment Utilized During Treatment: Gait belt Activity Tolerance: Patient tolerated treatment well Patient left: in bed;with bed alarm set;with call bell/phone within reach Nurse Communication: Mobility status PT Visit Diagnosis: Hemiplegia and hemiparesis;Difficulty in walking, not elsewhere classified (R26.2) Hemiplegia - Right/Left: Left Hemiplegia - dominant/non-dominant: Non-dominant Hemiplegia - caused by: Cerebral infarction     Time: 6160-7371 PT Time Calculation (min) (ACUTE ONLY): 22 min  Charges:  $Gait Training: 8-22 mins                     Rolinda Roan, PT, DPT Acute Rehabilitation Services Pager: [phone] Office: [phone]    Thelma Comp 10/29/2019, 3:40 PM

## 2019-10-29 NOTE — Progress Notes (Signed)
    Subjective  -   Feels good this morning   Physical Exam:  Grip strength slightly less in the left arm Able to left both legs off of the bed Oriented       Assessment/Plan:    Symptomatic right carotid stenosis: We discussed proceeding with right sided TCAR tomorrow.  She will continue her aspirin Plavix and statin.  Details of the procedure were discussed with the patient and she wishes to proceed.  [name] 10/29/2019 3:11 PM --  Vitals:   10/29/19 0751 10/29/19 1111  BP: (!) 151/93 (!) 165/83  Pulse: 60 62  Resp: 18 18  Temp: 98.1 F (36.7 C) 98 F (36.7 C)  SpO2: 93%     Intake/Output Summary (Last 24 hours) at 10/29/2019 1511 Last data filed at 10/29/2019 0521 Gross per 24 hour  Intake 480 ml  Output 1300 ml  Net -820 ml     Laboratory CBC    Component Value Date/Time   WBC 9.5 10/29/2019 0221   HGB 12.6 10/29/2019 0221   HGB 15.1 08/28/2019 1441   HCT 39.8 10/29/2019 0221   HCT 46.2 08/28/2019 1441   PLT 308 10/29/2019 0221   PLT 394 08/28/2019 1441    BMET    Component Value Date/Time   NA 139 10/29/2019 0221   NA 142 08/28/2019 1441   K 3.8 10/29/2019 0221   CL 105 10/29/2019 0221   CO2 22 10/29/2019 0221   GLUCOSE 116 (H) 10/29/2019 0221   BUN 23 (H) 10/29/2019 0221   BUN 29 (H) 08/28/2019 1441   CREATININE 1.23 (H) 10/29/2019 0221   CALCIUM 9.6 10/29/2019 0221   GFRNONAA 50 (L) 10/29/2019 0221   GFRAA 58 (L) 10/29/2019 0221    COAG Lab Results  Component Value Date   INR 1.1 10/27/2019   INR SPECIMEN CLOTTED 10/26/2019   INR 1.0 10/21/2019   No results found for: PTT  Antibiotics Anti-infectives (From admission, onward)   Start     Dose/Rate Route Frequency Ordered Stop   10/30/19 0000  ceFAZolin (ANCEF) IVPB 2g/100 mL premix     Discontinue    Note to Pharmacy: Send with pt to OR   2 g 200 mL/hr over 30 Minutes Intravenous On call 10/29/19 0957 11-03-19 0000   11/01/2019 1345  cefTRIAXone (ROCEPHIN) 1 g in sodium  chloride 0.9 % 100 mL IVPB        1 g 200 mL/hr over 30 Minutes Intravenous  Once 11/08/2019 1339 11/12/2019 1439       V. Leia Alf, M.D., [facility] [place] Office: [phone] Pager:  [phone]

## 2019-10-30 ENCOUNTER — Inpatient Hospital Stay (HOSPITAL_COMMUNITY): Payer: 59 | Admitting: Certified Registered Nurse Anesthetist

## 2019-10-30 ENCOUNTER — Inpatient Hospital Stay (HOSPITAL_COMMUNITY): Payer: 59

## 2019-10-30 ENCOUNTER — Encounter (HOSPITAL_COMMUNITY): Admission: EM | Disposition: E | Payer: Self-pay | Source: Home / Self Care | Attending: Internal Medicine

## 2019-10-30 ENCOUNTER — Inpatient Hospital Stay (HOSPITAL_COMMUNITY): Payer: 59 | Admitting: Critical Care Medicine

## 2019-10-30 ENCOUNTER — Encounter: Payer: 59 | Admitting: Physician Assistant

## 2019-10-30 DIAGNOSIS — I611 Nontraumatic intracerebral hemorrhage in hemisphere, cortical: Secondary | ICD-10-CM

## 2019-10-30 DIAGNOSIS — J988 Other specified respiratory disorders: Secondary | ICD-10-CM

## 2019-10-30 DIAGNOSIS — I619 Nontraumatic intracerebral hemorrhage, unspecified: Secondary | ICD-10-CM | POA: Diagnosis present

## 2019-10-30 DIAGNOSIS — I6523 Occlusion and stenosis of bilateral carotid arteries: Secondary | ICD-10-CM

## 2019-10-30 DIAGNOSIS — L8990 Pressure ulcer of unspecified site, unspecified stage: Secondary | ICD-10-CM | POA: Insufficient documentation

## 2019-10-30 HISTORY — PX: ULTRASOUND GUIDANCE FOR VASCULAR ACCESS: SHX6516

## 2019-10-30 HISTORY — PX: TRANSCAROTID ARTERY REVASCULARIZATIONÂ: SHX6778

## 2019-10-30 LAB — POCT I-STAT 7, (LYTES, BLD GAS, ICA,H+H)
Acid-base deficit: 2 mmol/L (ref 0.0–2.0)
Bicarbonate: 22.3 mmol/L (ref 20.0–28.0)
Calcium, Ion: 1.19 mmol/L (ref 1.15–1.40)
HCT: 33 % — ABNORMAL LOW (ref 36.0–46.0)
Hemoglobin: 11.2 g/dL — ABNORMAL LOW (ref 12.0–15.0)
O2 Saturation: 100 %
Patient temperature: 98.2
Potassium: 3.9 mmol/L (ref 3.5–5.1)
Sodium: 140 mmol/L (ref 135–145)
TCO2: 23 mmol/L (ref 22–32)
pCO2 arterial: 33.5 mmHg (ref 32.0–48.0)
pH, Arterial: 7.43 (ref 7.350–7.450)
pO2, Arterial: 375 mmHg — ABNORMAL HIGH (ref 83.0–108.0)

## 2019-10-30 LAB — BASIC METABOLIC PANEL
Anion gap: 11 (ref 5–15)
BUN: 26 mg/dL — ABNORMAL HIGH (ref 6–20)
CO2: 22 mmol/L (ref 22–32)
Calcium: 9.5 mg/dL (ref 8.9–10.3)
Chloride: 107 mmol/L (ref 98–111)
Creatinine, Ser: 1.31 mg/dL — ABNORMAL HIGH (ref 0.44–1.00)
GFR calc Af Amer: 54 mL/min — ABNORMAL LOW (ref >60)
GFR calc non Af Amer: 47 mL/min — ABNORMAL LOW (ref >60)
Glucose, Bld: 132 mg/dL — ABNORMAL HIGH (ref 70–99)
Potassium: 3.5 mmol/L (ref 3.5–5.1)
Sodium: 140 mmol/L (ref 135–145)

## 2019-10-30 LAB — CBC WITH DIFFERENTIAL/PLATELET
Abs Immature Granulocytes: 0.09 10*3/uL — ABNORMAL HIGH (ref 0.00–0.07)
Basophils Absolute: 0 10*3/uL (ref 0.0–0.1)
Basophils Relative: 0 %
Eosinophils Absolute: 0 10*3/uL (ref 0.0–0.5)
Eosinophils Relative: 0 %
HCT: 35.4 % — ABNORMAL LOW (ref 36.0–46.0)
Hemoglobin: 11.5 g/dL — ABNORMAL LOW (ref 12.0–15.0)
Immature Granulocytes: 1 %
Lymphocytes Relative: 4 %
Lymphs Abs: 0.6 10*3/uL — ABNORMAL LOW (ref 0.7–4.0)
MCH: 31.3 pg (ref 26.0–34.0)
MCHC: 32.5 g/dL (ref 30.0–36.0)
MCV: 96.5 fL (ref 80.0–100.0)
Monocytes Absolute: 0.5 10*3/uL (ref 0.1–1.0)
Monocytes Relative: 3 %
Neutro Abs: 15.2 10*3/uL — ABNORMAL HIGH (ref 1.7–7.7)
Neutrophils Relative %: 92 %
Platelets: 327 10*3/uL (ref 150–400)
RBC: 3.67 MIL/uL — ABNORMAL LOW (ref 3.87–5.11)
RDW: 12.4 % (ref 11.5–15.5)
WBC: 16.5 10*3/uL — ABNORMAL HIGH (ref 4.0–10.5)
nRBC: 0 % (ref 0.0–0.2)

## 2019-10-30 LAB — GLUCOSE, CAPILLARY
Glucose-Capillary: 150 mg/dL — ABNORMAL HIGH (ref 70–99)
Glucose-Capillary: 203 mg/dL — ABNORMAL HIGH (ref 70–99)
Glucose-Capillary: 176 mg/dL — ABNORMAL HIGH (ref 70–99)
Glucose-Capillary: 191 mg/dL — ABNORMAL HIGH (ref 70–99)
Glucose-Capillary: 179 mg/dL — ABNORMAL HIGH (ref 70–99)
Glucose-Capillary: 170 mg/dL — ABNORMAL HIGH (ref 70–99)

## 2019-10-30 LAB — COMPREHENSIVE METABOLIC PANEL
ALT: 13 U/L (ref 0–44)
AST: 20 U/L (ref 15–41)
Albumin: 3.2 g/dL — ABNORMAL LOW (ref 3.5–5.0)
Alkaline Phosphatase: 86 U/L (ref 38–126)
Anion gap: 12 (ref 5–15)
BUN: 21 mg/dL — ABNORMAL HIGH (ref 6–20)
CO2: 21 mmol/L — ABNORMAL LOW (ref 22–32)
Calcium: 9 mg/dL (ref 8.9–10.3)
Chloride: 105 mmol/L (ref 98–111)
Creatinine, Ser: 1.03 mg/dL — ABNORMAL HIGH (ref 0.44–1.00)
GFR calc Af Amer: >60 mL/min (ref >60)
GFR calc non Af Amer: >60 mL/min (ref >60)
Glucose, Bld: 190 mg/dL — ABNORMAL HIGH (ref 70–99)
Potassium: 3.8 mmol/L (ref 3.5–5.1)
Sodium: 138 mmol/L (ref 135–145)
Total Bilirubin: 0.4 mg/dL (ref 0.3–1.2)
Total Protein: 5.9 g/dL — ABNORMAL LOW (ref 6.5–8.1)

## 2019-10-30 LAB — CBC
HCT: 40 % (ref 36.0–46.0)
HCT: 37.6 % (ref 36.0–46.0)
Hemoglobin: 13 g/dL (ref 12.0–15.0)
Hemoglobin: 12.2 g/dL (ref 12.0–15.0)
MCH: 31.8 pg (ref 26.0–34.0)
MCH: 31.9 pg (ref 26.0–34.0)
MCHC: 32.5 g/dL (ref 30.0–36.0)
MCHC: 32.4 g/dL (ref 30.0–36.0)
MCV: 97.8 fL (ref 80.0–100.0)
MCV: 98.4 fL (ref 80.0–100.0)
Platelets: 318 10*3/uL (ref 150–400)
Platelets: 301 10*3/uL (ref 150–400)
RBC: 4.09 MIL/uL (ref 3.87–5.11)
RBC: 3.82 MIL/uL — ABNORMAL LOW (ref 3.87–5.11)
RDW: 12.4 % (ref 11.5–15.5)
RDW: 12.4 % (ref 11.5–15.5)
WBC: 9.1 10*3/uL (ref 4.0–10.5)
WBC: 13.9 10*3/uL — ABNORMAL HIGH (ref 4.0–10.5)
nRBC: 0 % (ref 0.0–0.2)
nRBC: 0 % (ref 0.0–0.2)

## 2019-10-30 LAB — CREATININE, SERUM
Creatinine, Ser: 1.14 mg/dL — ABNORMAL HIGH (ref 0.44–1.00)
GFR calc Af Amer: >60 mL/min (ref >60)
GFR calc non Af Amer: 55 mL/min — ABNORMAL LOW (ref >60)

## 2019-10-30 LAB — TYPE AND SCREEN
ABO/RH(D): A POS
Antibody Screen: NEGATIVE

## 2019-10-30 LAB — PROTIME-INR
INR: 1 (ref 0.8–1.2)
INR: 1.1 (ref 0.8–1.2)
Prothrombin Time: 12.5 seconds (ref 11.4–15.2)
Prothrombin Time: 13.2 seconds (ref 11.4–15.2)

## 2019-10-30 LAB — PHOSPHORUS: Phosphorus: 3 mg/dL (ref 2.5–4.6)

## 2019-10-30 LAB — ABO/RH: ABO/RH(D): A POS

## 2019-10-30 LAB — LACTIC ACID, PLASMA: Lactic Acid, Venous: 1.7 mmol/L (ref 0.5–1.9)

## 2019-10-30 LAB — MRSA PCR SCREENING: MRSA by PCR: NEGATIVE

## 2019-10-30 LAB — MAGNESIUM: Magnesium: 1.9 mg/dL (ref 1.7–2.4)

## 2019-10-30 LAB — APTT: aPTT: 29 seconds (ref 24–36)

## 2019-10-30 LAB — POCT ACTIVATED CLOTTING TIME: Activated Clotting Time: 340 seconds

## 2019-10-30 IMAGING — CT CT HEAD CODE STROKE
3 of 4 series · 13 of 47 positions shown, 15 images · IV contrast (omnipaque)
Comparison: Recent prior CT, CTA, MRI

CLINICAL DATA: Left-sided weakness

EXAM:
CT HEAD WITHOUT CONTRAST
CT ANGIOGRAPHY OF THE HEAD AND NECK
TECHNIQUE: Contiguous axial images were obtained from the base of the skull
through the vertex without intravenous contrast. Multidetector CT
imaging of the head and neck was performed using the standard
protocol during bolus administration of intravenous contrast.
Multiplanar CT image reconstructions and MIPs were obtained to
evaluate the vascular anatomy. Carotid stenosis measurements (when
applicable) are obtained utilizing NASCET criteria, using the distal
internal carotid diameter as the denominator.
CONTRAST:  40mL OMNIPAQUE IOHEXOL 350 MG/ML SOLN

[Series 3: head wo · axial · 0.39mm/px · z∈[-133,-13]mm · 7 of 32 slices shown, 9 images]
[im 4/32  brain]
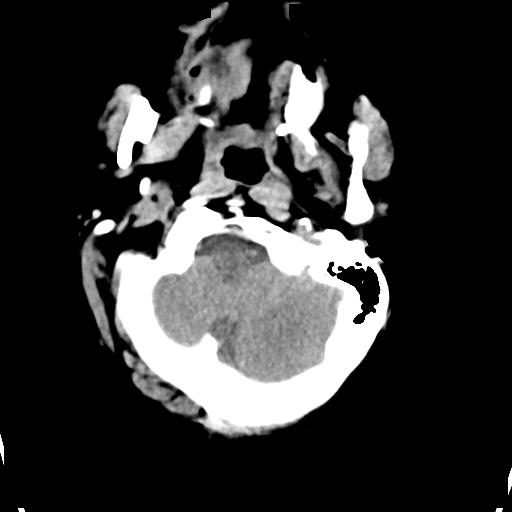
[im 4/32  bone]
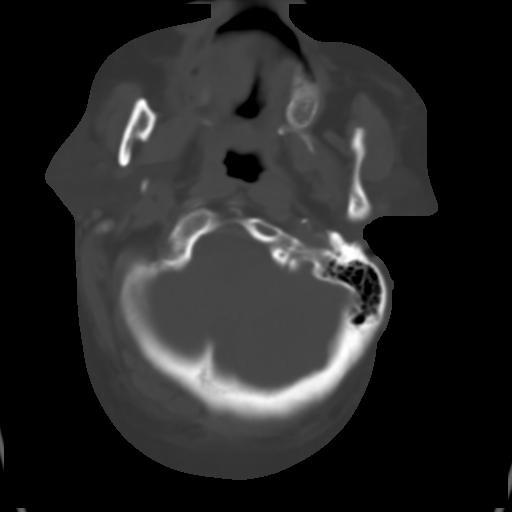
[im 8/32  brain]
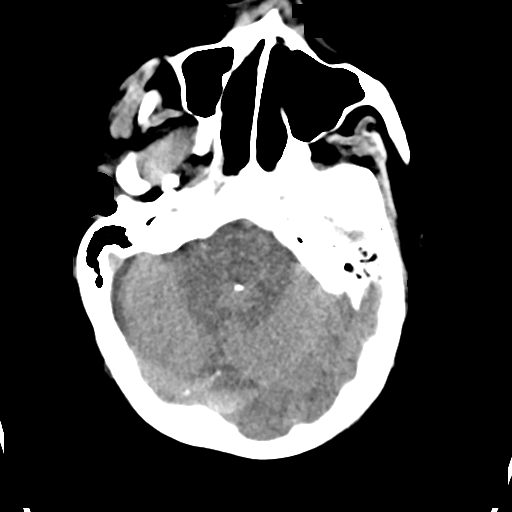
[im 12/32  brain]
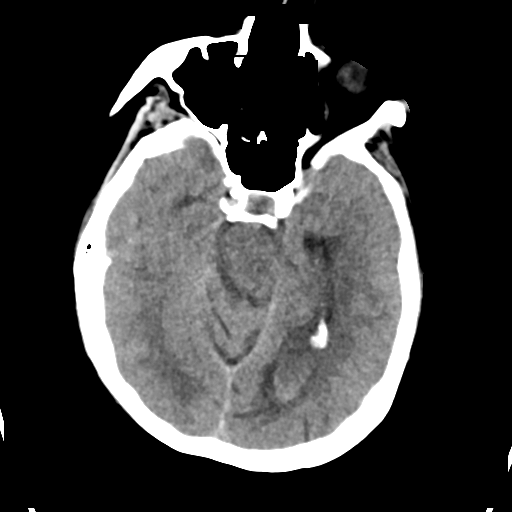
[im 16/32  brain]
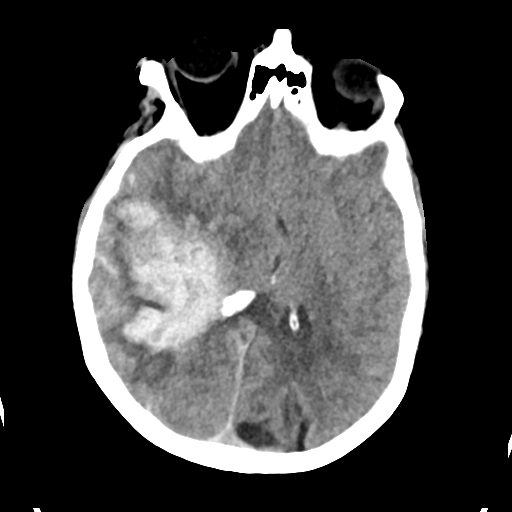
[im 20/32  brain]
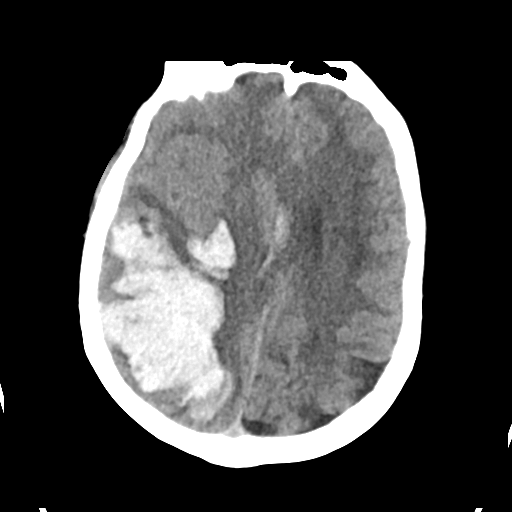
[im 20/32  bone]
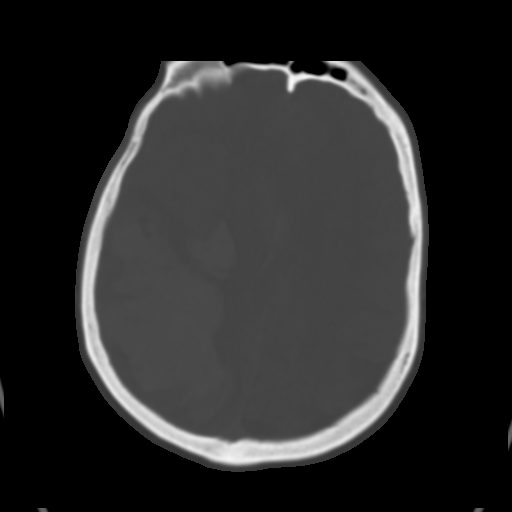
[im 24/32  brain]
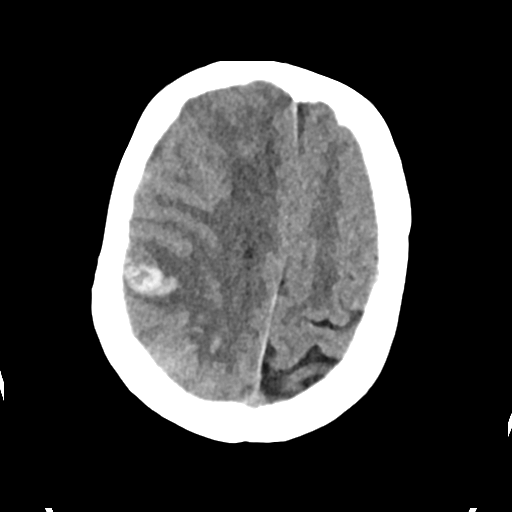
[im 28/32  brain]
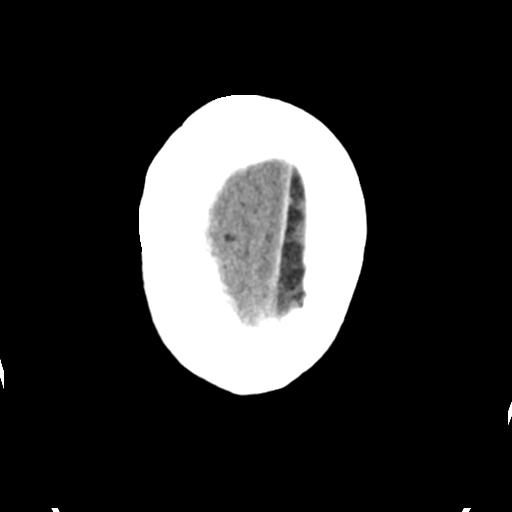

[Series 5: cor soft · coronal · 0.31mm/px · 3 of 72 slices shown]
[im 24/72  brain]
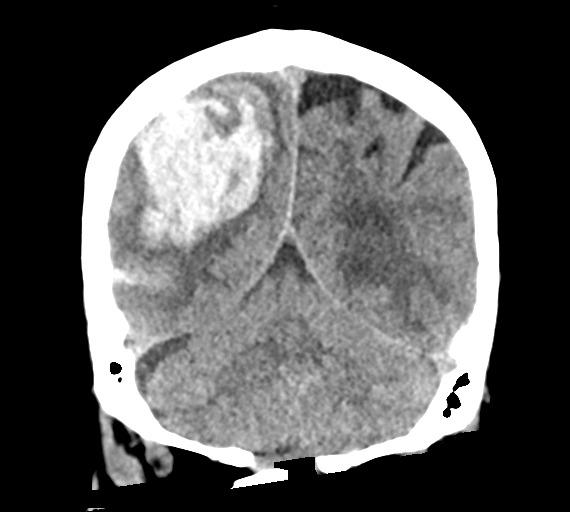
[im 32/72  brain]
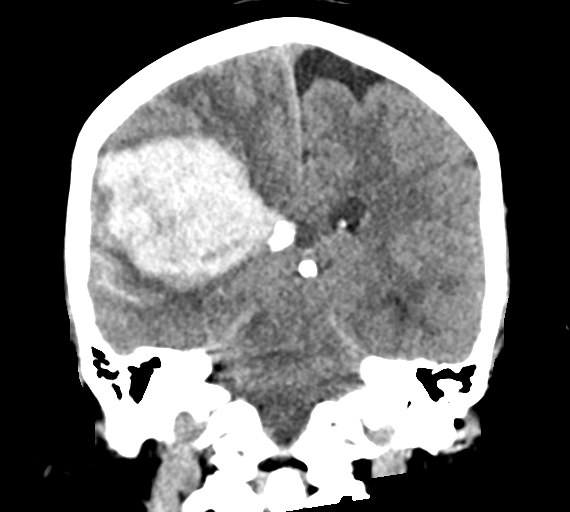
[im 40/72  brain]
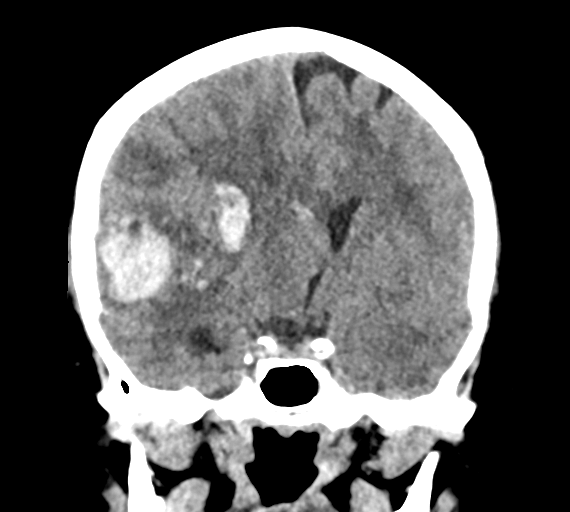

[Series 6: sag soft · sagittal · 0.33mm/px · 3 of 59 slices shown]
[im 22/59  brain]
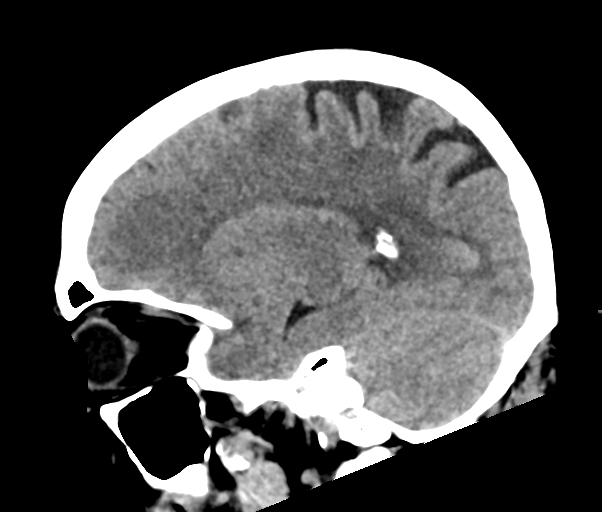
[im 30/59  brain]
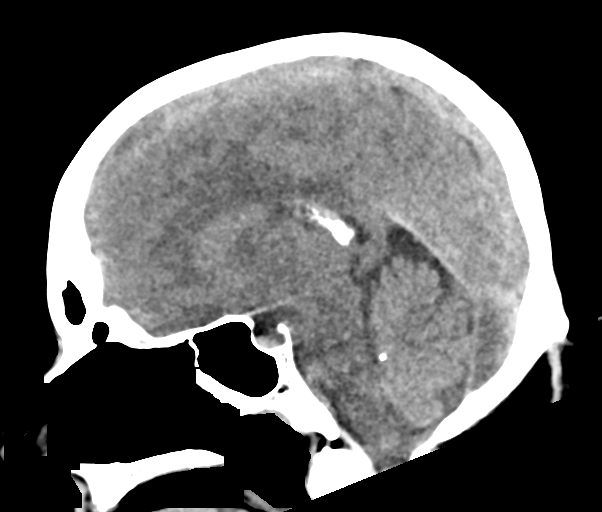
[im 37/59  brain]
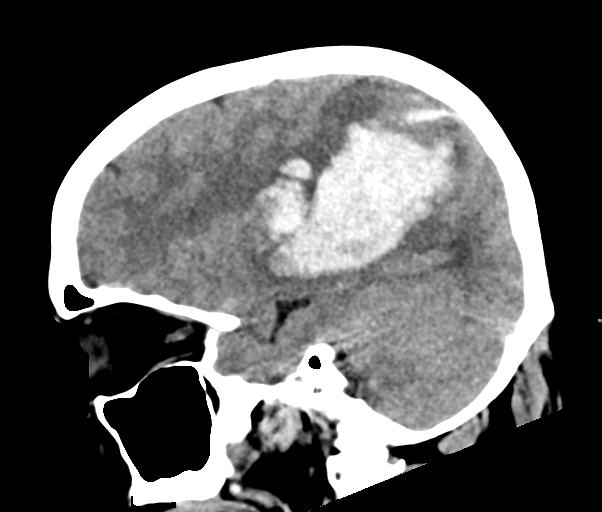

[13 of 47 positions shown; findings below may reference images not displayed]

FINDINGS: CT HEAD

Brain: New large area of hemorrhage primarily involving right
parietotemporal lobes. There is a sulcal subarachnoid extension as
well as intraventricular extension within the lateral ventricles.
Leftward midline shift measures approximately 9 mm. There is early
trapping of the left lateral ventricle. There is also trapping of
the right temporal horn with medialization of the right uncus. There
are some areas of hypoattenuation corresponding to infarctions seen
on recent MRI.

Vascular: Unremarkable.

Skull: Unremarkable.

Sinuses/Orbits: No acute abnormality.

CTA NECK

Aortic arch: Plaque along the aortic arch and great vessel origins
as before.

Right carotid system: Common carotid is patent with calcified and
noncalcified plaque. There is luminal narrowing from surrounding
postoperative change. Interval stent placement spanning the
bifurcation and proximal internal carotid. There remains luminal
narrowing within the patent stent of greater than 50% relative to
the distal cervical ICA. However, there is significant improvement
compared to the prior study.

Left carotid system: Common carotid is patent with calcified and
noncalcified plaque. As before, there is critical stenosis of the
distal left carotid bulb with no measurable residual lumen.

Vertebral arteries:Patent with multifocal plaque. Origins are not
well evaluated due to artifact.

Skeleton: No new findings

Other neck: Postoperative changes of right trans carotid stent
placement with soft tissue swelling and foci of gas.

CTA HEAD

Anterior circulation: Stable appearance of the intracranial internal
carotid arteries with diffuse plaque and stenoses. Proximal right
middle cerebral artery is patent. There is at least moderate
stenosis of the proximal right M1 MCA. Left middle and both anterior
cerebral arteries are patent. Atherosclerotic irregularity is again
identified.

Posterior circulation: Intracranial right vertebral artery is
patent. Poor opacification of the intracranial left vertebral artery
with severe stenosis proximally. Diminutive basilar is patent.
Posterior cerebral arteries are patent and primarily supplied by
posterior communicating arteries. Atherosclerotic irregularity again
noted.

Venous sinuses: Not well evaluated.
IMPRESSION: New large right parietotemporal hemorrhage with intraventricular
extension and mass effect including leftward midline shift and
trapping of the right temporal horn and left lateral ventricle. May
reflect hemorrhagic conversion of a posterior MCA territory
infarction.

No new large vessel occlusion. Postoperative changes of right trans
carotid stent placement. There is some narrowing of the right common
carotid from surrounding postoperative change. Right ICA stenosis is
improved and stent is patent with persistent greater than 50%
stenosis relative to distal ICA. Is

These results were called by telephone at the time of interpretation
on [DATE] at [DATE] to provider FILIEP ; FILIEP ,
who verbally acknowledged these results.

## 2019-10-30 IMAGING — CT CT ANGIO HEAD
3 of 7 series · 10 of 36 positions shown · IV contrast (APPLIED)
Comparison: Recent prior CT, CTA, MRI

CLINICAL DATA: Left-sided weakness

EXAM:
CT HEAD WITHOUT CONTRAST
CT ANGIOGRAPHY OF THE HEAD AND NECK
TECHNIQUE: Contiguous axial images were obtained from the base of the skull
through the vertex without intravenous contrast. Multidetector CT
imaging of the head and neck was performed using the standard
protocol during bolus administration of intravenous contrast.
Multiplanar CT image reconstructions and MIPs were obtained to
evaluate the vascular anatomy. Carotid stenosis measurements (when
applicable) are obtained utilizing NASCET criteria, using the distal
internal carotid diameter as the denominator.
CONTRAST:  40mL OMNIPAQUE IOHEXOL 350 MG/ML SOLN

[Series 5: cta neck/head · axial · 0.50mm/px · z∈[-206,-100]mm · 2 of 161 slices shown]
[im 54/161  soft-tissue]
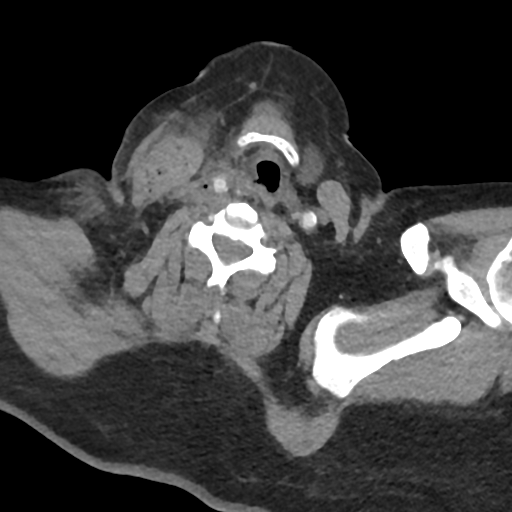
[im 107/161  soft-tissue]
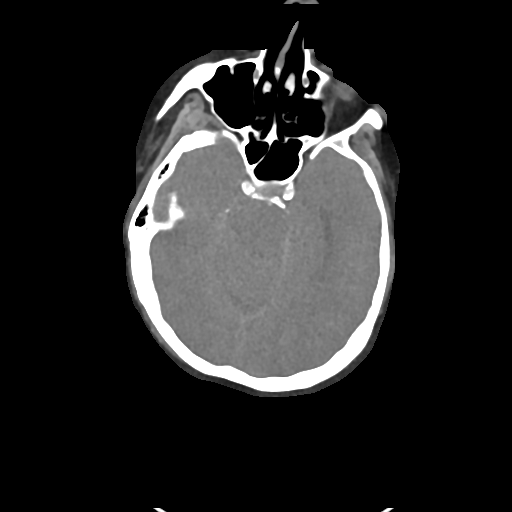

[Series 7: ax thins · axial · 0.39mm/px · z∈[-247,-19]mm · 6 of 321 slices shown]
[im 46/321  soft-tissue]
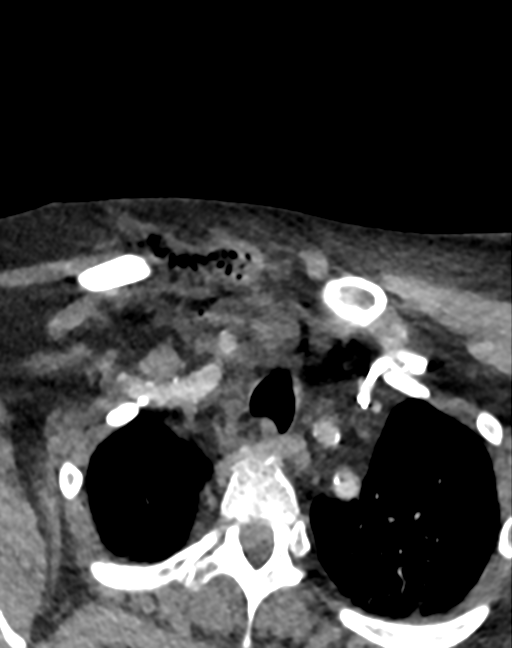
[im 92/321  bone]
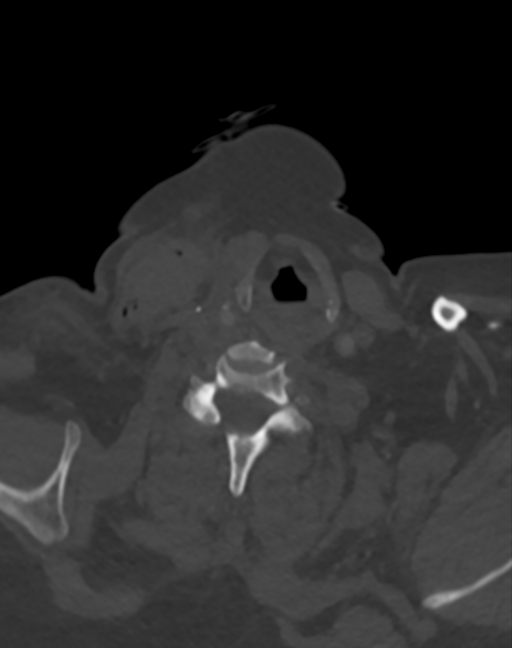
[im 138/321  soft-tissue]
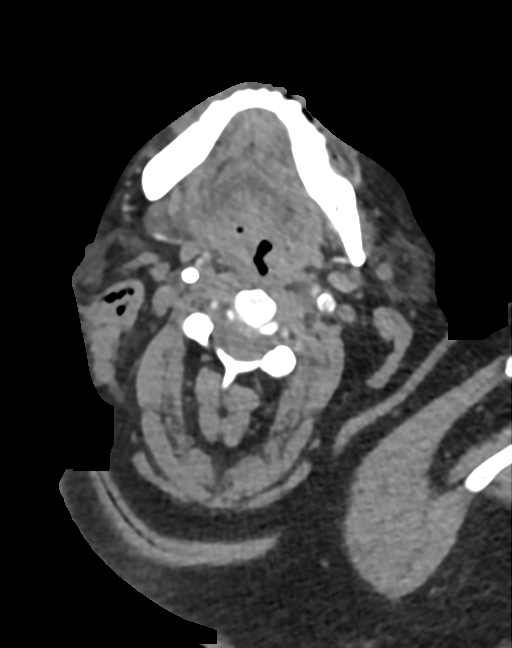
[im 183/321  bone]
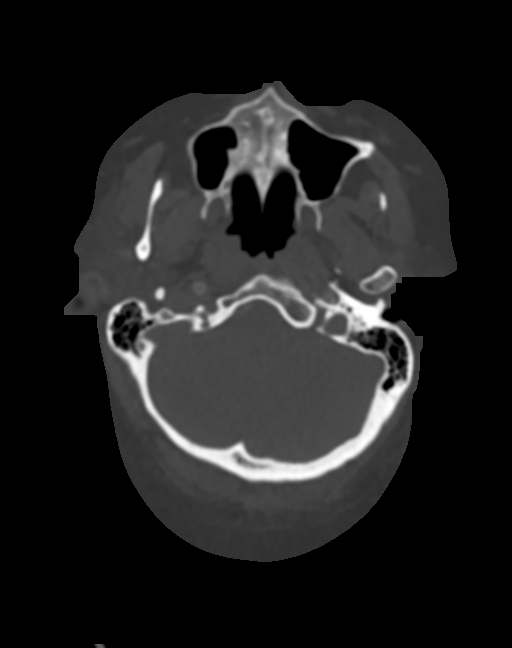
[im 229/321  soft-tissue]
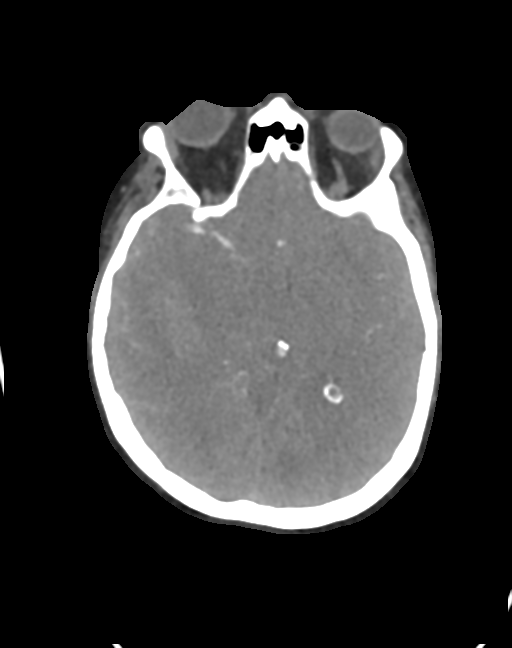
[im 275/321  bone]
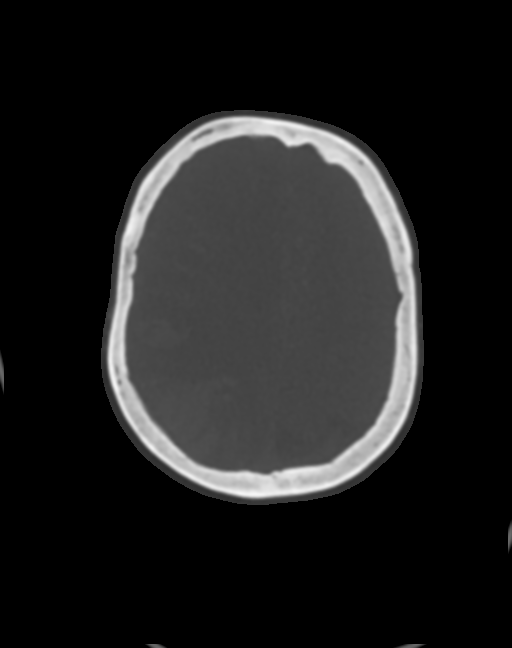

[Series 9: sag thins · sagittal · 0.49mm/px · 2 of 201 slices shown]
[im 9/201  soft-tissue]
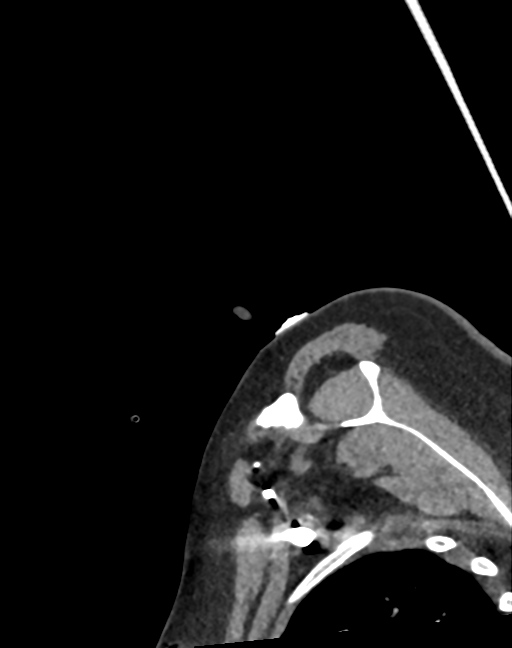
[im 193/201  soft-tissue]
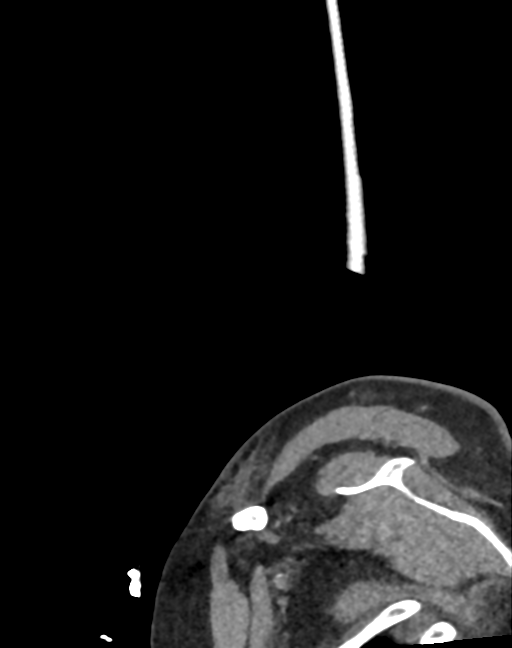

[10 of 36 positions shown; findings below may reference images not displayed]

FINDINGS: CT HEAD

Brain: New large area of hemorrhage primarily involving right
parietotemporal lobes. There is a sulcal subarachnoid extension as
well as intraventricular extension within the lateral ventricles.
Leftward midline shift measures approximately 9 mm. There is early
trapping of the left lateral ventricle. There is also trapping of
the right temporal horn with medialization of the right uncus. There
are some areas of hypoattenuation corresponding to infarctions seen
on recent MRI.

Vascular: Unremarkable.

Skull: Unremarkable.

Sinuses/Orbits: No acute abnormality.

CTA NECK

Aortic arch: Plaque along the aortic arch and great vessel origins
as before.

Right carotid system: Common carotid is patent with calcified and
noncalcified plaque. There is luminal narrowing from surrounding
postoperative change. Interval stent placement spanning the
bifurcation and proximal internal carotid. There remains luminal
narrowing within the patent stent of greater than 50% relative to
the distal cervical ICA. However, there is significant improvement
compared to the prior study.

Left carotid system: Common carotid is patent with calcified and
noncalcified plaque. As before, there is critical stenosis of the
distal left carotid bulb with no measurable residual lumen.

Vertebral arteries:Patent with multifocal plaque. Origins are not
well evaluated due to artifact.

Skeleton: No new findings

Other neck: Postoperative changes of right trans carotid stent
placement with soft tissue swelling and foci of gas.

CTA HEAD

Anterior circulation: Stable appearance of the intracranial internal
carotid arteries with diffuse plaque and stenoses. Proximal right
middle cerebral artery is patent. There is at least moderate
stenosis of the proximal right M1 MCA. Left middle and both anterior
cerebral arteries are patent. Atherosclerotic irregularity is again
identified.

Posterior circulation: Intracranial right vertebral artery is
patent. Poor opacification of the intracranial left vertebral artery
with severe stenosis proximally. Diminutive basilar is patent.
Posterior cerebral arteries are patent and primarily supplied by
posterior communicating arteries. Atherosclerotic irregularity again
noted.

Venous sinuses: Not well evaluated.
IMPRESSION: New large right parietotemporal hemorrhage with intraventricular
extension and mass effect including leftward midline shift and
trapping of the right temporal horn and left lateral ventricle. May
reflect hemorrhagic conversion of a posterior MCA territory
infarction.

No new large vessel occlusion. Postoperative changes of right trans
carotid stent placement. There is some narrowing of the right common
carotid from surrounding postoperative change. Right ICA stenosis is
improved and stent is patent with persistent greater than 50%
stenosis relative to distal ICA. Is

These results were called by telephone at the time of interpretation
on [DATE] at [DATE] to provider FILIEP ; FILIEP ,
who verbally acknowledged these results.

## 2019-10-30 IMAGING — DX DG CHEST 1V PORT
1 series · 1 of 1 positions shown · non-contrast
Comparison: [DATE]

CLINICAL DATA: Intubated

EXAM:
PORTABLE CHEST 1 VIEW

[chest ap]
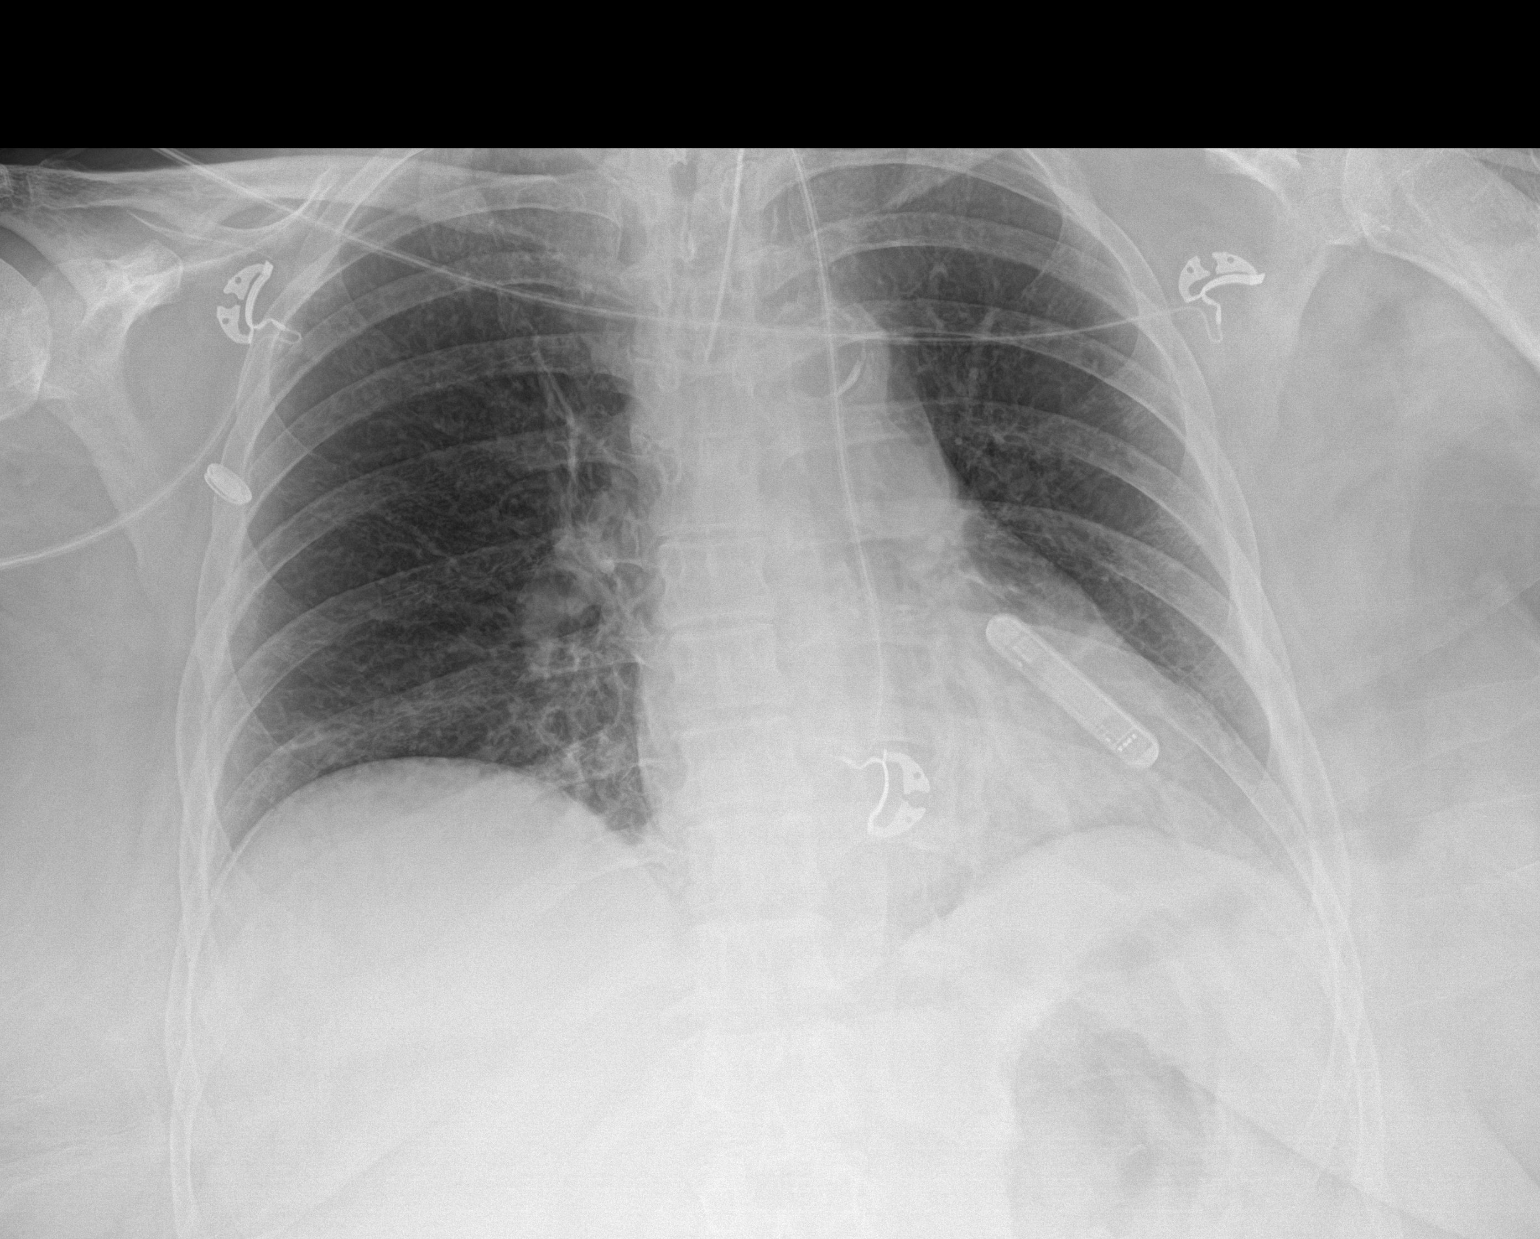

[1 of 1 positions shown; findings below may reference images not displayed]

FINDINGS: Endotracheal tube is 2.3 cm above the carina. Heart is borderline in
size. Bibasilar densities likely reflect atelectasis. No effusions
or pneumothorax. No acute bony abnormality. Loop recorder device
projects over the left chest.
IMPRESSION: Endotracheal tube 2.3 cm above the carina.

Bibasilar linear densities, likely atelectasis.

## 2019-10-30 SURGERY — TRANSCAROTID ARTERY REVASCULARIZATION (TCAR)
Anesthesia: General | Site: Neck | Laterality: Right

## 2019-10-30 MED ORDER — CLOPIDOGREL BISULFATE 75 MG PO TABS: 75.0000 mg | ORAL_TABLET | Freq: Every day | ORAL | Status: DC

## 2019-10-30 MED ORDER — ASPIRIN 81 MG PO CHEW: 81.0000 mg | CHEWABLE_TABLET | Freq: Every day | ORAL | Status: DC

## 2019-10-30 MED ORDER — SUGAMMADEX SODIUM 200 MG/2ML IV SOLN
INTRAVENOUS | Status: DC | PRN
  Administered 2019-10-30: 200 mg via INTRAVENOUS

## 2019-10-30 MED ORDER — ACETAMINOPHEN 160 MG/5ML PO SOLN: 1000.0000 mg | Freq: Once | ORAL | Status: DC | PRN

## 2019-10-30 MED ORDER — GLYCOPYRROLATE PF 0.2 MG/ML IJ SOSY
PREFILLED_SYRINGE | INTRAMUSCULAR | Status: AC
  Filled 2019-10-30: qty 1

## 2019-10-30 MED ORDER — EPHEDRINE 5 MG/ML INJ
INTRAVENOUS | Status: AC
  Filled 2019-10-30: qty 10

## 2019-10-30 MED ORDER — FENTANYL CITRATE (PF) 250 MCG/5ML IJ SOLN
INTRAMUSCULAR | Status: AC
  Filled 2019-10-30: qty 5

## 2019-10-30 MED ORDER — ACETAMINOPHEN 160 MG/5ML PO SOLN: 325.0000 mg | ORAL | Status: DC | PRN

## 2019-10-30 MED ORDER — CEFAZOLIN SODIUM-DEXTROSE 2-3 GM-%(50ML) IV SOLR
INTRAVENOUS | Status: DC | PRN
  Administered 2019-10-30: 2 g via INTRAVENOUS

## 2019-10-30 MED ORDER — DOCUSATE SODIUM 100 MG PO CAPS: 100.0000 mg | ORAL_CAPSULE | Freq: Every day | ORAL | Status: DC

## 2019-10-30 MED ORDER — IODIXANOL 320 MG/ML IV SOLN
INTRAVENOUS | Status: DC | PRN
  Administered 2019-10-30: 22 mL via INTRA_ARTERIAL

## 2019-10-30 MED ORDER — 0.9 % SODIUM CHLORIDE (POUR BTL) OPTIME
TOPICAL | Status: DC | PRN
  Administered 2019-10-30: 1000 mL

## 2019-10-30 MED ORDER — SUCCINYLCHOLINE CHLORIDE 20 MG/ML IJ SOLN
INTRAMUSCULAR | Status: DC | PRN
  Administered 2019-10-30: 140 mg via INTRAVENOUS

## 2019-10-30 MED ORDER — HEPARIN SODIUM (PORCINE) 1000 UNIT/ML IJ SOLN
INTRAMUSCULAR | Status: DC | PRN
Start: 2019-10-30 — End: 2019-10-30
  Administered 2019-10-30: 10000 [IU] via INTRAVENOUS

## 2019-10-30 MED ORDER — FENTANYL CITRATE (PF) 100 MCG/2ML IJ SOLN
INTRAMUSCULAR | Status: AC
  Filled 2019-10-30: qty 2

## 2019-10-30 MED ORDER — POTASSIUM CHLORIDE 20 MEQ PO PACK: 20.0000 meq | PACK | Freq: Every day | ORAL | Status: DC | PRN

## 2019-10-30 MED ORDER — ALUM & MAG HYDROXIDE-SIMETH 200-200-20 MG/5ML PO SUSP: 15.0000 mL | ORAL | Status: DC | PRN

## 2019-10-30 MED ORDER — EPHEDRINE SULFATE-NACL 50-0.9 MG/10ML-% IV SOSY
PREFILLED_SYRINGE | INTRAVENOUS | Status: DC | PRN
  Administered 2019-10-30 (×2): 10 mg via INTRAVENOUS
  Administered 2019-10-30: 5 mg via INTRAVENOUS
  Administered 2019-10-30: 10 mg via INTRAVENOUS
  Administered 2019-10-30: 5 mg via INTRAVENOUS

## 2019-10-30 MED ORDER — CEFAZOLIN SODIUM-DEXTROSE 2-4 GM/100ML-% IV SOLN
2.0000 g | Freq: Three times a day (TID) | INTRAVENOUS | Status: DC
  Administered 2019-10-30: 2 g via INTRAVENOUS
  Filled 2019-10-30: qty 100

## 2019-10-30 MED ORDER — PROPOFOL 500 MG/50ML IV EMUL
INTRAVENOUS | Status: DC | PRN
  Administered 2019-10-30: 75 ug/kg/min via INTRAVENOUS
  Administered 2019-10-30: 70 ug/kg/min via INTRAVENOUS

## 2019-10-30 MED ORDER — PANTOPRAZOLE SODIUM 40 MG PO TBEC: 40.0000 mg | DELAYED_RELEASE_TABLET | Freq: Every day | ORAL | Status: DC

## 2019-10-30 MED ORDER — PROPOFOL 10 MG/ML IV BOLUS
INTRAVENOUS | Status: DC | PRN
  Administered 2019-10-30: 50 mg via INTRAVENOUS
  Administered 2019-10-30: 30 mg via INTRAVENOUS

## 2019-10-30 MED ORDER — ONDANSETRON HCL 4 MG/2ML IJ SOLN
INTRAMUSCULAR | Status: AC
  Filled 2019-10-30: qty 2

## 2019-10-30 MED ORDER — PHENYLEPHRINE 40 MCG/ML (10ML) SYRINGE FOR IV PUSH (FOR BLOOD PRESSURE SUPPORT)
PREFILLED_SYRINGE | INTRAVENOUS | Status: AC
  Filled 2019-10-30: qty 10

## 2019-10-30 MED ORDER — GLYCOPYRROLATE PF 0.2 MG/ML IJ SOSY
PREFILLED_SYRINGE | INTRAMUSCULAR | Status: DC | PRN
Start: 2019-10-30 — End: 2019-10-30
  Administered 2019-10-30: .2 mg via INTRAVENOUS

## 2019-10-30 MED ORDER — HYDRALAZINE HCL 20 MG/ML IJ SOLN: 5.0000 mg | INTRAMUSCULAR | Status: DC | PRN

## 2019-10-30 MED ORDER — PHENYLEPHRINE 40 MCG/ML (10ML) SYRINGE FOR IV PUSH (FOR BLOOD PRESSURE SUPPORT)
PREFILLED_SYRINGE | INTRAVENOUS | Status: DC | PRN
  Administered 2019-10-30: 80 ug via INTRAVENOUS
  Administered 2019-10-30: 40 ug via INTRAVENOUS
  Administered 2019-10-30 (×3): 80 ug via INTRAVENOUS
  Administered 2019-10-30 (×2): 40 ug via INTRAVENOUS
  Administered 2019-10-30 (×2): 80 ug via INTRAVENOUS
  Administered 2019-10-30: 40 ug via INTRAVENOUS
  Administered 2019-10-30 (×2): 80 ug via INTRAVENOUS

## 2019-10-30 MED ORDER — FAMOTIDINE 40 MG/5ML PO SUSR: 20.0000 mg | Freq: Two times a day (BID) | ORAL | Status: DC

## 2019-10-30 MED ORDER — DEXAMETHASONE SODIUM PHOSPHATE 10 MG/ML IJ SOLN
INTRAMUSCULAR | Status: AC
  Filled 2019-10-30: qty 1

## 2019-10-30 MED ORDER — CHLORHEXIDINE GLUCONATE 0.12% ORAL RINSE (MEDLINE KIT)
15.0000 mL | Freq: Two times a day (BID) | OROMUCOSAL | Status: DC
  Administered 2019-10-30: 15 mL via OROMUCOSAL

## 2019-10-30 MED ORDER — ALBUMIN HUMAN 5 % IV SOLN
INTRAVENOUS | Status: DC | PRN
Start: 2019-10-30 — End: 2019-10-30

## 2019-10-30 MED ORDER — PHENYLEPHRINE HCL-NACL 20-0.9 MG/250ML-% IV SOLN: INTRAVENOUS | Status: DC | PRN

## 2019-10-30 MED ORDER — ROCURONIUM BROMIDE 100 MG/10ML IV SOLN
INTRAVENOUS | Status: DC | PRN
  Administered 2019-10-30: 80 mg via INTRAVENOUS

## 2019-10-30 MED ORDER — ACETAMINOPHEN 650 MG RE SUPP: 325.0000 mg | RECTAL | Status: DC | PRN

## 2019-10-30 MED ORDER — OXYCODONE HCL 5 MG PO TABS
5.0000 mg | ORAL_TABLET | ORAL | Status: DC | PRN
Start: 2019-10-30 — End: 2019-10-30

## 2019-10-30 MED ORDER — ACETAMINOPHEN 10 MG/ML IV SOLN
1000.0000 mg | Freq: Once | INTRAVENOUS | Status: DC | PRN
  Administered 2019-10-30: 1000 mg via INTRAVENOUS

## 2019-10-30 MED ORDER — POLYETHYLENE GLYCOL 3350 17 G PO PACK: 17.0000 g | PACK | Freq: Every day | ORAL | Status: DC

## 2019-10-30 MED ORDER — CLEVIDIPINE BUTYRATE 0.5 MG/ML IV EMUL
0.0000 mg/h | INTRAVENOUS | Status: DC
  Administered 2019-10-30: 1 mg/h via INTRAVENOUS

## 2019-10-30 MED ORDER — INSULIN ASPART 100 UNIT/ML ~~LOC~~ SOLN
0.0000 [IU] | SUBCUTANEOUS | Status: DC
  Administered 2019-10-30 (×2): 4 [IU] via SUBCUTANEOUS

## 2019-10-30 MED ORDER — PROTAMINE SULFATE 10 MG/ML IV SOLN
INTRAVENOUS | Status: DC | PRN
  Administered 2019-10-30: 100 mg via INTRAVENOUS

## 2019-10-30 MED ORDER — SODIUM CHLORIDE 0.9 % IV SOLN
0.0125 ug/kg/min | INTRAVENOUS | Status: AC
  Administered 2019-10-30: .15 ug/kg/min via INTRAVENOUS
  Filled 2019-10-30: qty 2000

## 2019-10-30 MED ORDER — POLYETHYLENE GLYCOL 3350 17 G PO PACK: 17.0000 g | PACK | Freq: Every day | ORAL | Status: DC | PRN

## 2019-10-30 MED ORDER — PROPOFOL 10 MG/ML IV BOLUS
INTRAVENOUS | Status: AC
  Filled 2019-10-30: qty 20

## 2019-10-30 MED ORDER — CHLORHEXIDINE GLUCONATE CLOTH 2 % EX PADS
6.0000 | MEDICATED_PAD | Freq: Every day | CUTANEOUS | Status: DC
  Administered 2019-10-30: 6 via TOPICAL

## 2019-10-30 MED ORDER — LIDOCAINE HCL (CARDIAC) PF 100 MG/5ML IV SOSY
PREFILLED_SYRINGE | INTRAVENOUS | Status: DC | PRN
  Administered 2019-10-30: 100 mg via INTRAVENOUS

## 2019-10-30 MED ORDER — PHENYLEPHRINE HCL-NACL 10-0.9 MG/250ML-% IV SOLN
INTRAVENOUS | Status: DC | PRN
  Administered 2019-10-30: 100 ug/min via INTRAVENOUS
  Administered 2019-10-30: 75 ug/min via INTRAVENOUS

## 2019-10-30 MED ORDER — PROPOFOL 1000 MG/100ML IV EMUL
INTRAVENOUS | Status: AC
  Filled 2019-10-30: qty 100

## 2019-10-30 MED ORDER — LACTATED RINGERS IV SOLN: INTRAVENOUS | Status: DC | PRN

## 2019-10-30 MED ORDER — ACETAMINOPHEN 10 MG/ML IV SOLN
INTRAVENOUS | Status: AC
  Filled 2019-10-30: qty 100

## 2019-10-30 MED ORDER — METOPROLOL TARTRATE 5 MG/5ML IV SOLN: 2.0000 mg | INTRAVENOUS | Status: DC | PRN

## 2019-10-30 MED ORDER — LIDOCAINE 2% (20 MG/ML) 5 ML SYRINGE
INTRAMUSCULAR | Status: DC | PRN
  Administered 2019-10-30: 60 mg via INTRAVENOUS

## 2019-10-30 MED ORDER — SODIUM CHLORIDE 0.9 % IV SOLN
INTRAVENOUS | Status: DC | PRN
  Administered 2019-10-30: 250 mL via INTRAVENOUS

## 2019-10-30 MED ORDER — ACETAMINOPHEN 325 MG PO TABS: 325.0000 mg | ORAL_TABLET | ORAL | Status: DC | PRN

## 2019-10-30 MED ORDER — SODIUM CHLORIDE 0.9 % IV SOLN
500.0000 mL | Freq: Once | INTRAVENOUS | Status: AC | PRN
  Administered 2019-10-30: 500 mL via INTRAVENOUS

## 2019-10-30 MED ORDER — ATORVASTATIN CALCIUM 80 MG PO TABS: 80.0000 mg | ORAL_TABLET | Freq: Every day | ORAL | Status: DC

## 2019-10-30 MED ORDER — FENTANYL CITRATE (PF) 250 MCG/5ML IJ SOLN
INTRAMUSCULAR | Status: DC | PRN
  Administered 2019-10-30: 125 ug via INTRAVENOUS

## 2019-10-30 MED ORDER — PROPOFOL 1000 MG/100ML IV EMUL
10.0000 ug/kg/min | INTRAVENOUS | Status: DC
  Administered 2019-10-30: 50 ug/kg/min via INTRAVENOUS
  Filled 2019-10-30: qty 100

## 2019-10-30 MED ORDER — ACETAMINOPHEN 500 MG PO TABS: 1000.0000 mg | ORAL_TABLET | Freq: Once | ORAL | Status: DC | PRN

## 2019-10-30 MED ORDER — OXYCODONE HCL 5 MG PO TABS: 5.0000 mg | ORAL_TABLET | Freq: Once | ORAL | Status: DC | PRN

## 2019-10-30 MED ORDER — LABETALOL HCL 5 MG/ML IV SOLN: 10.0000 mg | INTRAVENOUS | Status: DC | PRN

## 2019-10-30 MED ORDER — MAGNESIUM SULFATE 2 GM/50ML IV SOLN: 2.0000 g | Freq: Every day | INTRAVENOUS | Status: DC | PRN

## 2019-10-30 MED ORDER — MORPHINE SULFATE (PF) 2 MG/ML IV SOLN
2.0000 mg | INTRAVENOUS | Status: DC | PRN
  Administered 2019-10-30: 4 mg via INTRAVENOUS
  Filled 2019-10-30: qty 2

## 2019-10-30 MED ORDER — ORAL CARE MOUTH RINSE
15.0000 mL | OROMUCOSAL | Status: DC
  Administered 2019-10-30: 15 mL via OROMUCOSAL

## 2019-10-30 MED ORDER — PHENOL 1.4 % MT LIQD: 1.0000 | OROMUCOSAL | Status: DC | PRN

## 2019-10-30 MED ORDER — HEMOSTATIC AGENTS (NO CHARGE) OPTIME
TOPICAL | Status: DC | PRN
  Administered 2019-10-30: 1 via TOPICAL

## 2019-10-30 MED ORDER — DOCUSATE SODIUM 50 MG/5ML PO LIQD: 100.0000 mg | Freq: Two times a day (BID) | ORAL | Status: DC | PRN

## 2019-10-30 MED ORDER — SODIUM CHLORIDE 0.9 % IV SOLN
INTRAVENOUS | Status: DC | PRN
  Administered 2019-10-30: 500 mL

## 2019-10-30 MED ORDER — ONDANSETRON HCL 4 MG/2ML IJ SOLN: 4.0000 mg | Freq: Four times a day (QID) | INTRAMUSCULAR | Status: DC | PRN

## 2019-10-30 MED ORDER — ONDANSETRON HCL 4 MG/2ML IJ SOLN
INTRAMUSCULAR | Status: DC | PRN
  Administered 2019-10-30: 4 mg via INTRAVENOUS

## 2019-10-30 MED ORDER — IOHEXOL 350 MG/ML SOLN
40.0000 mL | Freq: Once | INTRAVENOUS | Status: AC | PRN
  Administered 2019-10-30: 40 mL via INTRAVENOUS

## 2019-10-30 MED ORDER — LABETALOL HCL 5 MG/ML IV SOLN
INTRAVENOUS | Status: AC
  Filled 2019-10-30: qty 4

## 2019-10-30 MED ORDER — LACTATED RINGERS IV BOLUS
1000.0000 mL | Freq: Once | INTRAVENOUS | Status: AC
  Administered 2019-10-30: 1000 mL via INTRAVENOUS

## 2019-10-30 MED ORDER — OXYCODONE HCL 5 MG/5ML PO SOLN: 5.0000 mg | Freq: Once | ORAL | Status: DC | PRN

## 2019-10-30 MED ORDER — POTASSIUM CHLORIDE CRYS ER 20 MEQ PO TBCR: 20.0000 meq | EXTENDED_RELEASE_TABLET | Freq: Every day | ORAL | Status: DC | PRN

## 2019-10-30 MED ORDER — LIDOCAINE 2% (20 MG/ML) 5 ML SYRINGE
INTRAMUSCULAR | Status: AC
  Filled 2019-10-30: qty 5

## 2019-10-30 MED ORDER — FENTANYL CITRATE (PF) 100 MCG/2ML IJ SOLN
25.0000 ug | INTRAMUSCULAR | Status: DC | PRN
  Administered 2019-10-30: 25 ug via INTRAVENOUS

## 2019-10-30 MED ORDER — ESMOLOL HCL 100 MG/10ML IV SOLN
INTRAVENOUS | Status: DC | PRN
  Administered 2019-10-30: 60 ug via INTRAVENOUS

## 2019-10-30 MED ORDER — ESMOLOL HCL 100 MG/10ML IV SOLN
INTRAVENOUS | Status: AC
  Filled 2019-10-30: qty 10

## 2019-10-30 MED ORDER — DEXAMETHASONE SODIUM PHOSPHATE 10 MG/ML IJ SOLN
INTRAMUSCULAR | Status: DC | PRN
  Administered 2019-10-30: 10 mg via INTRAVENOUS

## 2019-10-30 MED ORDER — DOCUSATE SODIUM 100 MG PO CAPS: 100.0000 mg | ORAL_CAPSULE | Freq: Two times a day (BID) | ORAL | Status: DC | PRN

## 2019-10-30 MED ORDER — SODIUM CHLORIDE 0.9 % IV SOLN: INTRAVENOUS | Status: DC

## 2019-10-30 MED ORDER — MIDAZOLAM HCL 2 MG/2ML IJ SOLN
INTRAMUSCULAR | Status: AC
  Filled 2019-10-30: qty 2

## 2019-10-30 MED ORDER — DOCUSATE SODIUM 50 MG/5ML PO LIQD: 100.0000 mg | Freq: Every day | ORAL | Status: DC

## 2019-10-30 MED ORDER — ROCURONIUM BROMIDE 10 MG/ML (PF) SYRINGE
PREFILLED_SYRINGE | INTRAVENOUS | Status: AC
  Filled 2019-10-30: qty 10

## 2019-10-30 MED ORDER — SUCCINYLCHOLINE CHLORIDE 200 MG/10ML IV SOSY
PREFILLED_SYRINGE | INTRAVENOUS | Status: AC
  Filled 2019-10-30: qty 10

## 2019-10-30 MED ORDER — GUAIFENESIN-DM 100-10 MG/5ML PO SYRP: 15.0000 mL | ORAL_SOLUTION | ORAL | Status: DC | PRN

## 2019-10-30 MED ORDER — SODIUM CHLORIDE 0.9 % IV SOLN: Freq: Once | INTRAVENOUS | Status: AC

## 2019-10-30 SURGICAL SUPPLY — 77 items
BAG BANDED W/RUBBER/TAPE 36X54 (MISCELLANEOUS) ×3 IMPLANT
BALLN STERLING RX 5X40X80 (BALLOONS) ×3
BALLOON STERLING RX 5X40X80 (BALLOONS) IMPLANT
CANISTER SUCT 3000ML PPV (MISCELLANEOUS) ×3 IMPLANT
CANNULA VESSEL 3MM 2 BLNT TIP (CANNULA) ×3 IMPLANT
CLIP VESOCCLUDE MED 6/CT (CLIP) ×3 IMPLANT
CLIP VESOCCLUDE SM WIDE 6/CT (CLIP) ×3 IMPLANT
COVER DOME SNAP 22 D (MISCELLANEOUS) ×3 IMPLANT
COVER PROBE W GEL 5X96 (DRAPES) ×3 IMPLANT
COVER SURGICAL LIGHT HANDLE (MISCELLANEOUS) ×1 IMPLANT
COVER WAND RF STERILE (DRAPES) ×3 IMPLANT
DECANTER SPIKE VIAL GLASS SM (MISCELLANEOUS) IMPLANT
DERMABOND ADVANCED (GAUZE/BANDAGES/DRESSINGS) ×1
DERMABOND ADVANCED .7 DNX12 (GAUZE/BANDAGES/DRESSINGS) ×2 IMPLANT
DRAIN HEMOVAC 1/8 X 5 (WOUND CARE) IMPLANT
DRAPE FEMORAL ANGIO 80X135IN (DRAPES) ×4 IMPLANT
DRAPE INCISE IOBAN 66X45 STRL (DRAPES) ×6 IMPLANT
DRSG TEGADERM 2-3/8X2-3/4 SM (GAUZE/BANDAGES/DRESSINGS) ×1 IMPLANT
DRSG TEGADERM 4X4.75 (GAUZE/BANDAGES/DRESSINGS) IMPLANT
ELECT REM PT RETURN 9FT ADLT (ELECTROSURGICAL) ×3
ELECTRODE REM PT RTRN 9FT ADLT (ELECTROSURGICAL) ×2 IMPLANT
EVACUATOR SILICONE 100CC (DRAIN) IMPLANT
GAUZE 4X4 16PLY RFD (DISPOSABLE) ×1 IMPLANT
GAUZE SPONGE 2X2 8PLY STRL LF (GAUZE/BANDAGES/DRESSINGS) IMPLANT
GLOVE BIO SURGEON STRL SZ7.5 (GLOVE) ×3 IMPLANT
GLOVE BIOGEL PI IND STRL 7.0 (GLOVE) IMPLANT
GLOVE BIOGEL PI INDICATOR 7.0 (GLOVE) ×2
GLOVE SURG SS PI 6.5 STRL IVOR (GLOVE) ×2 IMPLANT
GLOVE SURG SS PI 7.5 STRL IVOR (GLOVE) ×1 IMPLANT
GOWN STRL REUS W/ TWL LRG LVL3 (GOWN DISPOSABLE) ×6 IMPLANT
GOWN STRL REUS W/TWL LRG LVL3 (GOWN DISPOSABLE) ×9
GUIDEWIRE ENROUTE 0.014 (WIRE) ×1 IMPLANT
HEMOSTAT SPONGE AVITENE ULTRA (HEMOSTASIS) ×1 IMPLANT
INTRODUCER KIT GALT 7CM (INTRODUCER) ×3
KIT BASIN OR (CUSTOM PROCEDURE TRAY) ×3 IMPLANT
KIT ENCORE 26 ADVANTAGE (KITS) ×3 IMPLANT
KIT INTRODUCER GALT 7 (INTRODUCER) ×2 IMPLANT
KIT TURNOVER KIT B (KITS) ×3 IMPLANT
NDL HYPO 25GX1X1/2 BEV (NEEDLE) IMPLANT
NDL PERC 18GX7CM (NEEDLE) ×2 IMPLANT
NEEDLE HYPO 25GX1X1/2 BEV (NEEDLE) IMPLANT
NEEDLE PERC 18GX7CM (NEEDLE) ×3 IMPLANT
NS IRRIG 1000ML POUR BTL (IV SOLUTION) ×6 IMPLANT
PACK CAROTID (CUSTOM PROCEDURE TRAY) ×3 IMPLANT
PACK UNIVERSAL I (CUSTOM PROCEDURE TRAY) ×3 IMPLANT
PAD ARMBOARD 7.5X6 YLW CONV (MISCELLANEOUS) ×6 IMPLANT
POSITIONER HEAD DONUT 9IN (MISCELLANEOUS) ×3 IMPLANT
PROTECTION STATION PRESSURIZED (MISCELLANEOUS) ×3
SET MICROPUNCTURE 5F STIFF (MISCELLANEOUS) ×3 IMPLANT
SHEATH AVANTI 11CM 5FR (SHEATH) IMPLANT
SHUNT CAROTID BYPASS 10 (VASCULAR PRODUCTS) IMPLANT
SHUNT CAROTID BYPASS 12FRX15.5 (VASCULAR PRODUCTS) IMPLANT
SPONGE GAUZE 2X2 STER 10/PKG (GAUZE/BANDAGES/DRESSINGS) ×1
STATION PROTECTION PRESSURIZED (MISCELLANEOUS) ×2 IMPLANT
STENT TRANSCAROTID SYS 7X40 (Permanent Stent) ×1 IMPLANT
STOPCOCK MORSE 400PSI 3WAY (MISCELLANEOUS) ×3 IMPLANT
SUT ETHILON 3 0 PS 1 (SUTURE) IMPLANT
SUT PROLENE 5 0 C 1 24 (SUTURE) ×1 IMPLANT
SUT PROLENE 6 0 BV (SUTURE) ×1 IMPLANT
SUT PROLENE 6 0 CC (SUTURE) ×3 IMPLANT
SUT SILK 2 0 PERMA HAND 18 BK (SUTURE) ×3 IMPLANT
SUT SILK 2 0SH CR/8 30 (SUTURE) ×3 IMPLANT
SUT SILK 3 0 TIES 17X18 (SUTURE)
SUT SILK 3-0 18XBRD TIE BLK (SUTURE) IMPLANT
SUT VIC AB 3-0 SH 27 (SUTURE) ×3
SUT VIC AB 3-0 SH 27X BRD (SUTURE) ×2 IMPLANT
SUT VICRYL 4-0 PS2 18IN ABS (SUTURE) ×3 IMPLANT
SYR 10ML LL (SYRINGE) ×9 IMPLANT
SYR 20ML LL LF (SYRINGE) ×3 IMPLANT
SYR 5ML LL (SYRINGE) ×3 IMPLANT
SYR CONTROL 10ML LL (SYRINGE) IMPLANT
TOWEL GREEN STERILE (TOWEL DISPOSABLE) ×3 IMPLANT
TUBING EXTENTION W/L.L. (IV SETS) ×3 IMPLANT
WATER STERILE IRR 1000ML POUR (IV SOLUTION) ×3 IMPLANT
WIRE AMPLATZ SS-J .035X180CM (WIRE) IMPLANT
WIRE BENTSON .035X145CM (WIRE) ×3 IMPLANT
WIRE STARTER BENTSON 035X150 (WIRE) ×1 IMPLANT

## 2019-10-30 NOTE — Progress Notes (Signed)
Pt now not moving her left arm or left leg. Right neck incision mild swelling but not much different from 3-4 hours ago  Vitals:   10/26/2019 1300 11/14/2019 1330 11/03/2019 1400 11/05/2019 1430  BP: 120/81 117/89 (!) 143/91 (!) 155/96  Pulse: 92 88 75 89  Resp: (!) 22 (!) 21 16 (!) 29  Temp:      TempSrc:      SpO2: 97% 98% 98% 97%   Pt baseline confused but very difficult to communicate currently  Will call code stroke and proceed to CTA to assess stent  Ruta Hinds, MD [facility] Office: [phone]

## 2019-10-30 NOTE — Discharge Instructions (Signed)
[facility] [address] [facility]  Discharge Instructions   Transcarotid Artery Revascularization/ Stenting (TCAR)  Please refer to the following instructions for your post-procedure care. Your surgeon or physician assistant will discuss any changes with you.  Activity  You are encouraged to walk as much as you can. You can slowly return to normal activities but must avoid strenuous activity and heavy lifting until your doctor tell you it's okay. Avoid activities such as vacuuming or swinging a golf club. You can drive after one week if you are comfortable and you are no longer taking prescription pain medications. It is normal to feel tired for serval weeks after your surgery. It is also normal to have difficulty with sleep habits, eating, and bowel movements after surgery. These will go away with time.  Bathing/Showering  Shower daily after you go home. Do not soak in a bathtub, hot tub, or swim until the incision heals completely.  Incision Care  Shower every day. Clean your incision with mild soap and water. Pat the area dry with a clean towel. You do not need a bandage unless otherwise instructed. Do not apply any ointments or creams to your incision. You may have skin glue on your incision. Do not peel it off. It will come off on its own in about one week. Your incision may feel thickened and raised for several weeks after your surgery. This is normal and the skin will soften over time.   For Men Only: It's okay to shave around the incision but do not shave the incision itself for 2 weeks. It is common to have numbness under your chin that could last for several months.  Diet  Resume your normal diet. There are no special food restrictions following this procedure. A low fat/low cholesterol diet is recommended for all patients with vascular disease. In order to heal from your surgery, it is CRITICAL to get adequate nutrition. Your body requires vitamins, minerals, and  protein. Vegetables are the best source of vitamins and minerals. Vegetables also provide the perfect balance of protein. Processed food has little nutritional value, so try to avoid this.  Medications  Resume taking all of your medications unless your doctor or physician assistant tells you not to. If your incision is causing pain, you may take over-the- counter pain relievers such as acetaminophen (Tylenol). If you were prescribed a stronger pain medication, please be aware these medications can cause nausea and constipation. Prevent nausea by taking the medication with a snack or meal. Avoid constipation by drinking plenty of fluids and eating foods with a high amount of fiber, such as fruits, vegetables, and grains.   Do not take Tylenol if you are taking prescription pain medications.  Follow Up  Our office will schedule a follow up appointment 3 weeks following discharge.  Please call us immediately for any of the following conditions  . Increased pain, redness, drainage (pus) from your incision site. . Fever of 101 degrees or higher. . If you should develop stroke (slurred speech, difficulty swallowing, weakness on one side of your body, loss of vision) you should call 911 and go to the nearest emergency room. .  Reduce your risk of vascular disease:  . Stop smoking. If you would like help call [facility] at 1-800-QUIT-NOW 249-880-5696) or [place] at [phone]. . Manage your cholesterol . Maintain a desired weight . Control your diabetes . Keep your blood pressure down .  If you have any questions, please call the office at  [phone]. 

## 2019-10-30 NOTE — Consult Note (Signed)
NAME:  Pamela Carney, MRN:  [id number], DOB:  11-Jan-1966, LOS: 7 ADMISSION DATE:  11/04/2019, CONSULTATION DATE:  10/26/2019 REFERRING MD:  Dr. 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  Seen by neurology and vascular surgery.  Found to have b/l severe carotid stenosis with watershed ischemia.  She had Rt trans carotid artery revascularization on 10/30/19.  Had worsening neuro exam post procedure.  CT head showed large ICH.  Seen by neurosurgery and advised she was not a surgical candidate.  Remained on vent post-op and PCCM consulted.  Hx from chart and medical team.  Past Medical History  HTN, HLD, DM  Significant Hospital Events   8/02 Admit 8/09 Rt trans carotid artery revascularization   Consults:  Neurology Vascular surgery Neurosurgery  Procedures:  ETT 8/09 >>  Significant Diagnostic Tests:  CT head 8/09 >> large Rt parietotemporal hemorrhage with IV extension and mass effect with leftward midline shift  Micro Data:  COVID 8/02 >> negative  Antimicrobials:     Interim history/subjective:    Objective   Blood pressure 125/87, pulse 82, temperature 98.2 F (36.8 C), resp. rate 20, SpO2 100 %.    Vent Mode: PRVC FiO2 (%):  [100 %] 100 % Set Rate:  [16 bmp] 16 bmp Vt Set:  [400 mL] 400 mL PEEP:  [5 cmH20] 5 cmH20 Plateau Pressure:  [13 cmH20] 13 cmH20   Intake/Output Summary (Last 24 hours) at 11/03/2019 1730 Last data filed at 11/03/2019 1400 Gross per 24 hour  Intake 3594.09 ml  Output 1400 ml  Net 2194.09 ml   There were no vitals filed for this visit.  Examination:  General - sedated Eyes - Rt pupil larger than Lt ENT - ETT in place Cardiac - regular rate/rhythm, no murmur Chest - equal breath sounds b/l, no wheezing or rales Abdomen - soft, non tender, + bowel  sounds Extremities - no cyanosis, clubbing, or edema Skin - no rashes Neuro - not following commands  Resolved Hospital Problem list     Assessment & Plan:   Acute hypoxic respiratory failure with compromised airway in setting of ICH. - full vent support until family decides goals of care  Hx of CVA with carotid stenosis s/p Rt TCAR complicated by large ICH. - not surgical candidate per neurosurgery assessment - family deciding about goals of care - defer to neurology about plans for ASA, plavix  DM type II with hyperglycemia. - SSI   Best practice:  Diet: NPO DVT prophylaxis: SCDs GI prophylaxis: Pepcid Mobility: Bed rest Code Status: Full code Disposition: ICU  Labs   CBC: Recent Labs  Lab 10/24/19 0326 10/29/19 0221 11/03/2019 0417 11/16/2019 1122  WBC 9.5 9.5 9.1 13.9*  HGB 13.0 12.6 13.0 12.2  HCT 40.5 39.8 40.0 37.6  MCV 99.0 98.3 97.8 98.4  PLT 286 308 318 741    Basic Metabolic Panel: Recent Labs  Lab 10/24/19 0326 10/29/19 0221 11/19/2019 0417 11/17/2019 1122  NA 143 139 140  --   K 3.3* 3.8 3.5  --   CL 106 105 107  --   CO2 _0 --   GLUCOSE 114* 116* 132*  --   BUN 19 23* 26*  --   CREATININE 1.11* 1.23* 1.31* 1.14*  CALCIUM 9.7 9.6 9.5  --  MG  --  2.0  --   --    GFR: Estimated Creatinine Clearance: 62.3 mL/min (A) (by C-G formula based on SCr of 1.14 mg/dL (H)). Recent Labs  Lab 10/24/19 0326 10/29/19 0221 11/21/2019 0417 11/02/2019 1122  WBC 9.5 9.5 9.1 13.9*    Liver Function Tests: Recent Labs  Lab 10/24/19 0326 10/29/19 0221  AST 16 18  ALT 19 15  ALKPHOS 106 90  BILITOT 0.9 0.8  PROT 6.3* 6.1*  ALBUMIN 3.2* 3.1*   No results for input(s): LIPASE, AMYLASE in the last 168 hours. No results for input(s): AMMONIA in the last 168 hours.  ABG    Component Value Date/Time   PHART 7.410 12/15/2018 0707   PCO2ART 37.6 12/15/2018 0707   PO2ART 73.0 (L) 12/15/2018 0707   HCO3 23.6 12/15/2018 0707   TCO2 22 11/14/2019  1020   ACIDBASEDEF 1.0 12/15/2018 0505   O2SAT 94.0 12/15/2018 0707     Coagulation Profile: Recent Labs  Lab 11/13/2019 0417  INR 1.0    Cardiac Enzymes: No results for input(s): CKTOTAL, CKMB, CKMBINDEX, TROPONINI in the last 168 hours.  HbA1C: Hgb A1c MFr Bld  Date/Time Value Ref Range Status  10/24/2019 03:26 AM 7.4 (H) 4.8 - 5.6 % Final    Comment:    (NOTE) Pre diabetes:          5.7%-6.4%  Diabetes:              >6.4%  Glycemic control for   <7.0% adults with diabetes   09/23/2019 09:27 AM 7.5 (H) 4.8 - 5.6 % Final    Comment:    (NOTE) Pre diabetes:          5.7%-6.4%  Diabetes:              >6.4%  Glycemic control for   <7.0% adults with diabetes     CBG: Recent Labs  Lab 10/29/19 1740 10/29/19 2124 10/24/2019 0605 11/18/2019 1036 10/29/2019 1500  GLUCAP 154* 185* 150* 203* 176*    Review of Systems:   Unable to obtain.  Past Medical History  She,  has a past medical history of Abnormal Pap smear, AKI (acute kidney injury) ([place]) (12/28/2017), Anxiety, Arthritis, Bipolar 1 disorder ([place]), Cataract, Cholelithiasis (12/23/2017), Chronic kidney disease (CKD), stage III (moderate), Cystitis, Depression, Diabetes mellitus without complication ([place]), Genital warts, GERD (gastroesophageal reflux disease), Heart murmur, Hematuria, History of blood transfusion, History of ectopic pregnancy, History of gestational diabetes, History of kidney stones, History of sepsis, Hyperlipidemia, Hypertension, Idiopathic peripheral neuropathy, Leg ulcer, left ([place]), Low back pain, Migraines, Obesity, Oral candidiasis, Pneumonia, PONV (postoperative nausea and vomiting), Recurrent UTI, Renal calculi (12/23/2017), Sigmoid diverticulosis (12/23/2017), Sleep apnea, Stroke ([place]), Ulcer of foot ([place]), and Weakness.   Surgical History    Past Surgical History:  Procedure Laterality Date  . BUBBLE STUDY  12/23/2018   Procedure: BUBBLE STUDY;  Surgeon: Lelon Perla, MD;  Location:  [facility] ENDOSCOPY;  Service: Cardiovascular;;  . CERVICAL CONE BIOPSY    . CESAREAN SECTION     x3  . CYSTOSCOPY W/ URETERAL STENT PLACEMENT Right 12/15/2018   Procedure: Cystoscopy, right retrograde ureteropyelogram, fluoroscopic interpretation, right double-J stent placement (24 cm x 6 [place]);  Surgeon: Franchot Gallo, MD;  Location: [place];  Service: Urology;  Laterality: Right;  . CYSTOSCOPY WITH RETROGRADE PYELOGRAM, URETEROSCOPY AND STENT PLACEMENT Right 01/27/2019   Procedure: CYSTOSCOPY WITH RETROGRADE PYELOGRAM, URETEROSCOPY AND STENT PLACEMENT; BASKET STONE RETRIEVAL;  Surgeon: Alexis Frock, MD;  Location: [facility] ORS;  Service: Urology;  Laterality: Right;  75 MINS  . CYSTOSCOPY/URETEROSCOPY/HOLMIUM LASER/STENT PLACEMENT Right 02/04/2018   Procedure: CYSTOSCOPY/URETEROSCOPY/RETROGRADE PYELOGRAM/HOLMIUM LASER/STENT PLACEMENT;  Surgeon: Alexis Frock, MD;  Location: [facility]S;  Service: Urology;  Laterality: Right;  . CYSTOSCOPY/URETEROSCOPY/HOLMIUM LASER/STENT PLACEMENT Right 02/07/2018   Procedure: CYSTOSCOPY LEFT STENT EXCHANGE;  Surgeon: Alexis Frock, MD;  Location: [facility];  Service: Urology;  Laterality: Right;  . DILATION AND CURETTAGE OF UTERUS    . ECTOPIC PREGNANCY SURGERY    . IR NEPHROSTOMY PLACEMENT LEFT  12/25/2017  . LOOP RECORDER INSERTION N/A 12/23/2018   Procedure: LOOP RECORDER INSERTION;  Surgeon: Constance Haw, MD;  Location: [facility];  Service: Cardiovascular;  Laterality: N/A;  . NEPHROLITHOTOMY Left 02/04/2018   Procedure: NEPHROLITHOTOMY PERCUTANEOUS;  Surgeon: Alexis Frock, MD;  Location: [facility];  Service: Urology;  Laterality: Left;  3 HRS  . NEPHROLITHOTOMY Left 02/07/2018   Procedure: SECOND LOOK NEPHROLITHOTOMY PERCUTANEOUS;  Surgeon: Alexis Frock, MD;  Location: [facility];  Service: Urology;  Laterality: Left;  2 HRS  . OVARIAN CYST REMOVAL    . TEE WITHOUT CARDIOVERSION N/A 12/23/2018   Procedure: TRANSESOPHAGEAL ECHOCARDIOGRAM (TEE);   Surgeon: Lelon Perla, MD;  Location: [facility] ENDOSCOPY;  Service: Cardiovascular;  Laterality: N/A;  . UNILATERAL SALPINGECTOMY     Pt unsure of which fallopian tube was removed  . WISDOM TOOTH EXTRACTION       Social History   reports that she has been smoking cigarettes. She has a 37.00 pack-year smoking history. She has never used smokeless tobacco. She reports that she does not drink alcohol and does not use drugs.   Family History   Her family history includes Alcohol abuse in her father; COPD in her mother and paternal grandfather; Cancer in her father, paternal grandfather, and paternal grandmother; Depression in her father and mother; Diabetes in her mother and paternal grandfather; Heart attack in her father, mother, and sister; Heart disease in her mother, paternal grandfather, and paternal grandmother; Heart failure in her mother; Hyperlipidemia in her mother; Hypertension in her father, mother, and sister; Peripheral vascular disease in her mother; Stroke in her maternal grandmother. There is no history of Colon cancer, Colon polyps, Esophageal cancer, Rectal cancer, or Stomach cancer.   Allergies Allergies  Allergen Reactions  . Sulfa Antibiotics Hives and Swelling    Swelling site not recalled  . Latex Rash  . Tape Rash    Not tolerated well     Home Medications  Prior to Admission medications   Medication Sig Start Date End Date Taking? Authorizing Provider  aspirin 81 MG chewable tablet Chew 1 tablet (81 mg total) by mouth daily. 09/26/19  Yes Andrew Au, MD  atorvastatin (LIPITOR) 80 MG tablet Take 1 tablet (80 mg total) by mouth daily at 6 PM. 01/16/19  Yes [name], NP  AZO CRANBERRY GUMMIES PO Take 2 tablets by mouth daily.   Yes [provider]  Blood Glucose Monitoring Suppl (ONETOUCH VERIO) w/Device KIT Use to check blood sugars every morning fasting and 2 hours after largest meal Patient taking differently: 1 each by Other route See admin  instructions. Use to check blood sugars every morning fasting and 2 hours after largest meal 02/02/18  Yes Danford, Valetta Fuller D, NP  cetirizine (ZYRTEC) 10 MG tablet Take 1 tablet (10 mg total) by mouth daily. 05/25/19 10/24/19 Yes [name], NP  DULoxetine (CYMBALTA) 60 MG capsule TAKE 1 CAPSULE BY MOUTH DAILY  Patient taking differently: Take 60 mg by mouth daily.  09/13/19  Yes Newlin, Charlane Ferretti, MD  fluticasone (FLONASE) 50 MCG/ACT nasal spray Place 1 spray into both nostrils daily. 06/24/19 10/24/19 Yes Mercy Riding, MD  glucose blood (ONETOUCH VERIO) test strip Use to check blood sugars every morning fasting and 2 hours after largest meal Patient taking differently: 1 each by Other route See admin instructions. Use to check blood sugars every morning fasting and 2 hours after largest meal 02/23/19  Yes [name], NP  ibuprofen (ADVIL) 200 MG tablet Take 200 mg by mouth every 6 (six) hours as needed for moderate pain.   Yes [provider]  insulin glargine (LANTUS) 100 UNIT/ML Solostar Pen Inject 15 Units into the skin at bedtime. Patient taking differently: Inject 25 Units into the skin at bedtime.  08/28/19  Yes [name], NP  Insulin Pen Needle (PEN NEEDLES) 31G X 8 MM MISC Use as instructed. Inject into the skin once nightly. Patient taking differently: 1 each by Other route See admin instructions. Use as instructed. Inject into the skin once nightly. 08/28/19  Yes [name], NP  ketoconazole (NIZORAL) 2 % cream Apply 1 application topically 2 (two) times daily. 10/12/19  Yes Hyatt, Max T, DPM  Multiple Vitamins-Minerals (ONE-A-DAY WOMENS 50+ ADVANTAGE) TABS Take 1 tablet by mouth daily.   Yes [provider]  ondansetron (ZOFRAN) 4 MG tablet Take 1 tablet (4 mg total) by mouth every 8 (eight) hours as needed for nausea or vomiting. 05/26/19  Yes [name], NP  ONE TOUCH LANCETS MISC Use to check blood sugars every morning fasting and 2 hours after largest  meal Patient taking differently: 1 each by Other route See admin instructions. Use to check blood sugars every morning fasting and 2 hours after largest meal 02/02/18  Yes Danford, Valetta Fuller D, NP  pregabalin (LYRICA) 75 MG capsule Take 1 capsule (75 mg total) by mouth 2 (two) times daily. 10/12/19  Yes Hyatt, Max T, DPM  ticagrelor (BRILINTA) 90 MG TABS tablet Take 1 tablet (90 mg total) by mouth 2 (two) times daily. 09/25/19  Yes Andrew Au, MD  topiramate (TOPAMAX) 100 MG tablet Take 1 tablet (100 mg total) by mouth 2 (two) times daily. Patient taking differently: Take 50 mg by mouth 2 (two) times daily.  06/27/19 06/26/20 Yes Frann Rider, NP     Critical care time: 34 minutes  Chesley Mires, MD [place] Pager - [phone] 11/05/2019, 5:45 PM

## 2019-10-30 NOTE — Anesthesia Procedure Notes (Signed)
Procedure Name: Intubation Date/Time: 11/11/2019 3:38 PM Performed by: Wilburn Cornelia, CRNA Pre-anesthesia Checklist: Patient identified, Emergency Drugs available, Suction available, Patient being monitored and Timeout performed Patient Re-evaluated:Patient Re-evaluated prior to induction Oxygen Delivery Method: Ambu bag Preoxygenation: Pre-oxygenation with 100% oxygen Induction Type: IV induction Laryngoscope Size: Mac and 4 Grade View: Grade I Tube type: Oral Tube size: 7.5 mm Number of attempts: 1 Airway Equipment and Method: Stylet Placement Confirmation: ETT inserted through vocal cords under direct vision,  positive ETCO2,  CO2 detector and breath sounds checked- equal and bilateral Secured at: 21 cm Tube secured with: Tape Dental Injury: Teeth and Oropharynx as per pre-operative assessment

## 2019-10-30 NOTE — Progress Notes (Signed)
Patient status declined in CT after code stroke was initiated. Anesthesia, Neurology, and Rapid Response at the bedside. Verbal order for SBP to remain < 140. Vital signs from CT remained stable with all SBP < 140. Patient back in PACU, awaiting bed placement in 4N ICU.   Gabriel Rainwater, RN

## 2019-10-30 NOTE — Code Documentation (Signed)
Patient from 3W for admission with a watershed infarct. Pt went for a right carotid endarterectomy procedure today for "symptomatic carotid artery stenosis".  A code stroke was called in [place] [facility] after RNs  noticed increased left-sided weakness and a change in LOC. Vascular surgeon had been called several times prior for increased swelling of right neck at surgical site. Stroke team, Neurologist, and RRT responded to bedside. Pt was taken for a CT but on the way down the hall pt had more difficulty breathing and did not seem to be protecting her airway. Pt was taken into CT 1 and anesthesia met there to intubate and get further IV access for scans. CT/CTA were completed and revealed:  "New large right parietotemporal hemorrhage with intraventricular extension and mass effect including leftward midline shift and trapping of the right temporal horn and left lateral ventricle. May reflect hemorrhagic conversion of a posterior MCA territory Infarction."   Pharmacy called and BP meds delivered to bedside to keep sys <140. Pt taken back up to PACU until 4N ICU bed available. Neurosurgery consulted and at the bedside. Family was called and updated by neurology. Anesthesia placed an Arterial line in PACU. Plan: frequent neuro checks/vitals and pupils. Keep BP<140. SRN at bedside with PACU RN's to help until transfer to 4N ICU. Miah Boye, Rande Brunt, RN, SCRN

## 2019-10-30 NOTE — Progress Notes (Addendum)
   Subjective:   Yesterday, patient complained of some headache and was given Toradol, which improved her pain. No acute events over. This AM, patient was taken to the OR for a Right TCAR.   After success R TCAR, pt was found to be at neuro baseline with some confusion and left hand/arm weakness o/w neuro intact. However, pt started not moving her left arm or left leg so code stroke was called and she was sent for a CTA to assess the stent.   Patient unavailable throughout the day for an evaluation by the team.  Objective:  Vital signs in last 24 hours: Vitals:   10/29/19 1609 10/29/19 1938 10/29/19 2344 10/26/2019 0324  BP: 134/81 (!) 147/99 115/81 125/89  Pulse: 66 66 65 62  Resp: 18 18 18 18   Temp: 97.8 F (36.6 C) 97.9 F (36.6 C) 98.2 F (36.8 C) 97.9 F (36.6 C)  TempSrc:  Oral Oral Oral  SpO2: 95% 100% 98% 97%     Assessment/Plan:  Principal Problem:   Symptomatic carotid artery stenosis Active Problems:   CKD (chronic kidney disease), stage III   Type 2 diabetes mellitus with other specified complication (HCC)   Bipolar 1 disorder (HCC)   Obesity   Chronic mesenteric ischemia (HCC)  [name] is a 54 year old woman with medical history significant for prior cryptogenic stroke (failed aspirin and Plavix), diabetes mellitus, hyperlipidemia, CKD stage III, hypertension, bipolar 1 disorder, chronic mesenteric ischemia here for management of generalized weakness and altered mental status.  She has been neurologically improving, and underwent R TCAR as planned today.  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  She is intubated and her prognosis is very poor for meaningful survival.  Review of documentation indicates that her family is aware and her husband is coming to hospital for further discussions.  The internal  medicine team is available to help in any way we can.       Lacinda Axon, MD 10/24/2019, 6:25 AM Pager: [phone] Internal Medicine Teaching Service After 5pm on weekdays and 1pm on weekends: On Call pager [phone]

## 2019-10-30 NOTE — Progress Notes (Signed)
Pt CT shows large intracranial bleed on ipsilateral side.  She is currently being evaluated by the neuro service.  I discussed with her husband that this is a significant injury that is life threatening problem or significant permanent neurologic deficit. I have updated the pt husband on this situation  The Neurology service is currently evaluating her and is involved with discussions with the family.  Pamela Hinds MD

## 2019-10-30 NOTE — Transfer of Care (Signed)
Immediate Anesthesia Transfer of Care Note  Patient: Pamela Carney  Procedure(s) Performed: Dorthula Perfect ARTERY REVASCULARIZATION (Right Neck) ULTRASOUND GUIDANCE FOR VASCULAR ACCESS (Right Groin)  Patient Location: PACU  Anesthesia Type:General  Level of Consciousness: awake and patient cooperative  Airway & Oxygen Therapy: Patient Spontanous Breathing and Patient connected to face mask oxygen  Post-op Assessment: Report given to RN, Post -op Vital signs reviewed and stable and Patient moving all extremities X 4  Post vital signs: Reviewed and stable  Last Vitals:  Vitals Value Taken Time  BP 116/89 10/25/2019 1034  Temp    Pulse 98 11/08/2019 1035  Resp 34 10/28/2019 1035  SpO2 97 % 11/09/2019 1035  Vitals shown include unvalidated device data.  Last Pain:  Vitals:   11/05/2019 0324  TempSrc: Oral  PainSc:          Complications: No complications documented.

## 2019-10-30 NOTE — Consult Note (Addendum)
Neurology Consultation Reason for Consult: Code stroke  Referring Physician: Dr. Oleta Mouse  CC: Increasing left-sided weakness  History is obtained from: Patient and nursing staff at bedside as well as Dr. Ermalene Postin and chart review.   HPI: CORAL TIMME is a 54 y.o. female with bilateral carotid stenosis and a prior left occipital infarct which occurred in the setting of urosepsis.  Initially there were plans to do delayed revascularization, but given watershed infarcts the timeline for revascularization was moved up.  She was allowed to maintain permissive hypertension throughout the procedure.  She was last at her baseline neurological examination on nursings 12 PM ev aluation.  Her blood pressures have been running in the 160s over 70s, and her glucose in the high normal range as well.  Over the course of the day she was noted to be getting progressively weaker in the left side, with a hematoma developing at the surgical site.  When she became unable to move the left side at all, on nursing evaluation, a code stroke was activated.  LKW: Noon on 8/9 tPA given?: No due to intracerebral hemorrhage  ICH Score: 2  Time performed: 5 minutes prior to head CT GCS: 13-15 is 0 points Infratentorial: No.. If yes, 1 point Volume: >30cc is 1 point  Age: 54 y.o.. >80 is 1 point Intraventricular extension is 1 point  A Score of 2 points has a 30 day mortality of 26%. Stroke. 2001 Apr;32(4):891-7.  On review of systems patient did endorse a headache, and just generally felt "terrible" but she could not provide more specifics given the acute nature of her condition   Exam: Current vital signs: BP (!) 127/99   Pulse (!) 101   Temp 98.2 F (36.8 C)   Resp 17   SpO2 100%  Vital signs in last 24 hours: Temp:  [97.8 F (36.6 C)-98.2 F (36.8 C)] 98.2 F (36.8 C) (08/09 1648) Pulse Rate:  [62-107] 101 (08/09 1900) Resp:  [12-29] 17 (08/09 1900) BP: (103-155)/(58-130) 127/99 (08/09  1900) SpO2:  [93 %-100 %] 100 % (08/09 1900) Arterial Line BP: (79-140)/(59-94) 132/90 (08/09 1845) FiO2 (%):  [40 %-100 %] 40 % (08/09 1733)   Physical Exam  Constitutional: Appears obese and chronically ill Psych: Too sleepy to reliably assess affect, but cooperative when awake Eyes: No scleral injection HENT: No OP obstruction, hematoma at the right carotid endarterectomy site MSK: no joint deformities.  Cardiovascular: Normal rate and regular rhythm.  Respiratory: Intermittently quite labored breathing GI: Soft.  No distension. There is no tenderness.   Neuro:  On my initial evaluation, the patient was sleepy but with stimulation would awaken and attend to the examiner, though she was neglecting the left side.  She was able to name and repeat, states that it was August and that he was here for a right carotid endarterectomy.  Pupils were equal and reactive from 3 to 1 mm, she had a right gaze preference but could bury her sclera to the left, corneals were intact, cough was spontaneous, and she was extensor posturing on the left upper extremity to noxious stimulation, with triple flexion in the left lower extremity, for stimulation.  She was strong in the right upper and lower extremities.  During transport to CT her quality of breathing became different and her blood pressures began to drop.  NIHSS score: 21 on initial evaluation in the PACU 1A: Level of Consciousness - 1 1B: Ask Month and Age - 2 1C: 'Blink Eyes' & '  Squeeze Hands' - 1 2: Test Horizontal Extraocular Movements - 2 3: Test Visual Fields - 2 4: Test Facial Palsy - 2 5A: Test Left Arm Motor Drift - 3 (extensor posturing) 5B: Test Right Arm Motor Drift - 0 6A: Test Left Leg Motor Drift - 3 (triple flexion) 6B: Test Right Leg Motor Drift - 0 7: Test Limb Ataxia - 0 8: Test Sensation - 2 9: Test Language/Aphasia- 1 10: Test Dysarthria - 1 11: Test Extinction/Inattention - 1  After intubation she had pupils  reactive from 2.5 to 2 mm very sluggish, bilateral corneals intact, cough and gag intact,   I have reviewed labs in epic and the results pertinent to this consultation are: Normal PT, PTT and INR, normal platelets  Head CT was personally reviewed and demonstrated a massive hemorrhage  Impression: Kazoua Gossen is a 54 year old woman who had severe symptomatic bilateral carotid stenosis.  Post-procedurally she suffered a intracerebral hemorrhage.  Given the time course of her symptoms with initial gradual worsening followed by a rapid decline in her mental status, she likely suffered an ischemic stroke with secondary hemorrhagic conversion.  Extensive discussion was had with neurosurgery as well as the patient's husband, son, and sister Reather Converse.  The patient's husband told me that he and his wife had discussed that neither of them would want aggressive medical treatments in the setting of an overall poor medical prognosis.  We discussed that even with the most aggressive treatment data suggested that her chance of 30-day mortality would be ~25% based on her ICH score and that in the best case scenario she would require significant nursing care and would not be able to move the left side of her body but may be able to have conversations with the family.  We discussed that even if she did not pass away, she would be at risk of developing pneumonias and other infections due to how critically ill she is.  He informed me that he had had discussions with her and that she would not have wanted aggressive care in a situation with such a poor prognosis.  He confirmed that he wanted to have family come and visit to see goodbye, and that they were all local and would be able to come within the day.  He confirmed that after these visits she would want to be palliatively extubated.  In the interim, he confirmed that continuing the current care was acceptable but escalation of care such as cardiopulmonary resuscitation  would not be what his wife would want.  Recommendations:  # Intracerebral hemorrhage -Neurosurgery consulted for consideration of surgical intervention -Patient intubated for airway protection, critical care medicine consulted for ventilator management -Systolic blood pressure goal less than 140; will not start pressors or have a floor blood pressure goal given goals of care  # Goals of care -Goals of care discussions held with family, decision made to transition to palliative care after end-of-life visits were completed with family   Time coordinating the evaluation of the patient, discussions with family about end-of-life care and goals of care took 8 minutes  [place] [phone]

## 2019-10-30 NOTE — Anesthesia Preprocedure Evaluation (Addendum)
Anesthesia Evaluation  Patient identified by MRN, date of birth, ID band Patient confused  General Assessment Comment:Unsure of surgery or procedural side. Oriented to name and year. Called husband who was aware of surgery and expressed understanding without further questions.  Reviewed: Allergy & Precautions, NPO status , Patient's Chart, lab work & pertinent test results  History of Anesthesia Complications (+) PONV and history of anesthetic complications  Airway Mallampati: III  TM Distance: >3 FB Neck ROM: Full    Dental  (+) Dental Advisory Given   Pulmonary sleep apnea , Current Smoker and Patient abstained from smoking.,    breath sounds clear to auscultation       Cardiovascular hypertension, + Peripheral Vascular Disease   Rhythm:Regular  2020 tte:  1. Left ventricular ejection fraction, by visual estimation, is 55 to  60%. The left ventricle has normal function. There is mildly increased  left ventricular hypertrophy.  2. Global right ventricle has normal systolic function.The right  ventricular size is not well visualized.  3. Left atrial size was normal.  4. Right atrial size was normal.  5. The mitral valve is normal in structure. No evidence of mitral valve  regurgitation.  6. The tricuspid valve is normal in structure. Tricuspid valve  regurgitation is trivial.  7. The aortic valve is tricuspid Aortic valve regurgitation was not  visualized by color flow Doppler. Mild aortic valve sclerosis without  stenosis.  8. The pulmonic valve was grossly normal. Pulmonic valve regurgitation is  not visualized by color flow Doppler.  9. Moderate plaque invoving the descending aorta.  10. Normal LV systolic function; mild LVH; lipomatous hypertrophy;  negative saline microcavitation study.    Neuro/Psych  Headaches, PSYCHIATRIC DISORDERS Anxiety Depression Bipolar Disorder Left sided weakness per notes   Neuromuscular disease CVA, Residual Symptoms    GI/Hepatic Neg liver ROS,   Endo/Other  diabetes, Type 2, Insulin Dependent  Renal/GU CRF and ARFRenal disease     Musculoskeletal  (+) Arthritis ,   Abdominal   Peds  Hematology Plavix last dose 8/8   Anesthesia Other Findings Chronic mesenteric ischemia  Reproductive/Obstetrics                            Anesthesia Physical Anesthesia Plan  ASA: IV  Anesthesia Plan: General   Post-op Pain Management:    Induction: Intravenous  PONV Risk Score and Plan: 3 and Ondansetron, Propofol infusion and TIVA  Airway Management Planned: Oral ETT  Additional Equipment: Arterial line  Intra-op Plan:   Post-operative Plan: Extubation in OR  Informed Consent:     Dental advisory given  Plan Discussed with: CRNA and Surgeon  Anesthesia Plan Comments:         Anesthesia Quick Evaluation

## 2019-10-30 NOTE — Consult Note (Signed)
Reason for Consult: Right intracranial hemorrhage Referring Physician: Dr. Elizabeth Sauer Pamela Carney is an 54 y.o. female.  HPI: The patient is a 54 year old white female smoker on Plavix with multiple vascular issues including progressive cerebrovascular accidents.  She was admitted and found to have bilateral carotid artery stenosis.  She underwent a right carotid endarterectomy by Dr. Oneida Alar today.  By report she initially awoke from anesthesia uneventfully but became left hemiplegic.  She had a decreased mental status and was intubated.  She was taken to the CAT scanner where a CT scan demonstrated a large right intracerebral hemorrhage.  She was evaluated by the stroke team.  A neurosurgical consultation was requested.  I immediately came from the office and evaluated the patient.  Presently she is intubated.  Past Medical History:  Diagnosis Date  . Abnormal Pap smear   . AKI (acute kidney injury) ([place]) 12/28/2017  . Anxiety   . Arthritis    bil knees, neck  . Bipolar 1 disorder ([place])   . Cataract    Mild  . Cholelithiasis 12/23/2017   noted on CT renal, pt unaware  . Chronic kidney disease (CKD), stage III (moderate)   . Cystitis   . Depression   . Diabetes mellitus without complication ([place])    type 2  . Genital warts    Hx of genital  . GERD (gastroesophageal reflux disease)   . Heart murmur    childhood  . Hematuria   . History of blood transfusion   . History of ectopic pregnancy   . History of gestational diabetes   . History of kidney stones   . History of sepsis    after ectopic pregnancy  . Hyperlipidemia   . Hypertension   . Idiopathic peripheral neuropathy    both feet  . Leg ulcer, left ([place])   . Low back pain   . Migraines   . Obesity   . Oral candidiasis   . Pneumonia   . PONV (postoperative nausea and vomiting)    prolonged sedation  . Recurrent UTI   . Renal calculi 12/23/2017   Multiple bilateral nonobstructing, 2.2 cm lower pole partial  staghorn on left, noted on CT renal  . Sigmoid diverticulosis 12/23/2017   noted on CT renal, pt unaware  . Sleep apnea    uses cpap  . Stroke [facility])    Cryptogenic  . Ulcer of foot ([place])    Left  . Weakness     Past Surgical History:  Procedure Laterality Date  . BUBBLE STUDY  12/23/2018   Procedure: BUBBLE STUDY;  Surgeon: Lelon Perla, MD;  Location: [facility] ENDOSCOPY;  Service: Cardiovascular;;  . CERVICAL CONE BIOPSY    . CESAREAN SECTION     x3  . CYSTOSCOPY W/ URETERAL STENT PLACEMENT Right 12/15/2018   Procedure: Cystoscopy, right retrograde ureteropyelogram, fluoroscopic interpretation, right double-J stent placement (24 cm x 6 [place]);  Surgeon: Franchot Gallo, MD;  Location: [place];  Service: Urology;  Laterality: Right;  . CYSTOSCOPY WITH RETROGRADE PYELOGRAM, URETEROSCOPY AND STENT PLACEMENT Right 01/27/2019   Procedure: CYSTOSCOPY WITH RETROGRADE PYELOGRAM, URETEROSCOPY AND STENT PLACEMENT; BASKET STONE RETRIEVAL;  Surgeon: Alexis Frock, MD;  Location: [facility] ORS;  Service: Urology;  Laterality: Right;  75 MINS  . CYSTOSCOPY/URETEROSCOPY/HOLMIUM LASER/STENT PLACEMENT Right 02/04/2018   Procedure: CYSTOSCOPY/URETEROSCOPY/RETROGRADE PYELOGRAM/HOLMIUM LASER/STENT PLACEMENT;  Surgeon: Alexis Frock, MD;  Location: [facility];  Service: Urology;  Laterality: Right;  . CYSTOSCOPY/URETEROSCOPY/HOLMIUM LASER/STENT PLACEMENT Right 02/07/2018   Procedure: CYSTOSCOPY LEFT  STENT EXCHANGE;  Surgeon: Alexis Frock, MD;  Location: [facility];  Service: Urology;  Laterality: Right;  . DILATION AND CURETTAGE OF UTERUS    . ECTOPIC PREGNANCY SURGERY    . IR NEPHROSTOMY PLACEMENT LEFT  12/25/2017  . LOOP RECORDER INSERTION N/A 12/23/2018   Procedure: LOOP RECORDER INSERTION;  Surgeon: Constance Haw, MD;  Location: [place] CV LAB;  Service: Cardiovascular;  Laterality: N/A;  . NEPHROLITHOTOMY Left 02/04/2018   Procedure: NEPHROLITHOTOMY PERCUTANEOUS;  Surgeon: Alexis Frock, MD;   Location: [facility];  Service: Urology;  Laterality: Left;  3 HRS  . NEPHROLITHOTOMY Left 02/07/2018   Procedure: SECOND LOOK NEPHROLITHOTOMY PERCUTANEOUS;  Surgeon: Alexis Frock, MD;  Location[facility];  Service: Urology;  Laterality: Left;  2 HRS  . OVARIAN CYST REMOVAL    . TEE WITHOUT CARDIOVERSION N/A 12/23/2018   Procedure: TRANSESOPHAGEAL ECHOCARDIOGRAM (TEE);  Surgeon: Lelon Perla, MD;  Location: [facility] ENDOSCOPY;  Service: Cardiovascular;  Laterality: N/A;  . UNILATERAL SALPINGECTOMY     Pt unsure of which fallopian tube was removed  . WISDOM TOOTH EXTRACTION      Family History  Problem Relation Age of Onset  . Diabetes Paternal Grandfather   . COPD Paternal Grandfather   . Heart disease Paternal Grandfather   . Cancer Paternal Grandfather        bone  . Cancer Paternal Grandmother   . Heart disease Paternal Grandmother   . Cancer Father   . Hypertension Father   . Heart attack Father   . Alcohol abuse Father   . Depression Father   . Heart failure Mother   . Diabetes Mother   . Heart disease Mother   . Hyperlipidemia Mother   . Hypertension Mother   . Heart attack Mother   . Peripheral vascular disease Mother   . Depression Mother   . COPD Mother   . Hypertension Sister   . Heart attack Sister   . Stroke Maternal Grandmother   . Colon cancer Neg Hx   . Colon polyps Neg Hx   . Esophageal cancer Neg Hx   . Rectal cancer Neg Hx   . Stomach cancer Neg Hx     Social History:  reports that she has been smoking cigarettes. She has a 37.00 pack-year smoking history. She has never used smokeless tobacco. She reports that she does not drink alcohol and does not use drugs.  Allergies:  Allergies  Allergen Reactions  . Sulfa Antibiotics Hives and Swelling    Swelling site not recalled  . Latex Rash  . Tape Rash    Not tolerated well    Medications:  I have reviewed the patient's current medications. Prior to Admission:  Medications Prior to Admission   Medication Sig Dispense Refill Last Dose  . aspirin 81 MG chewable tablet Chew 1 tablet (81 mg total) by mouth daily. 90 tablet 0 Past Week at Unknown time  . atorvastatin (LIPITOR) 80 MG tablet Take 1 tablet (80 mg total) by mouth daily at 6 PM. 90 tablet 2 Past Week at Unknown time  . AZO CRANBERRY GUMMIES PO Take 2 tablets by mouth daily.   unknown  . Blood Glucose Monitoring Suppl (ONETOUCH VERIO) w/Device KIT Use to check blood sugars every morning fasting and 2 hours after largest meal (Patient taking differently: 1 each by Other route See admin instructions. Use to check blood sugars every morning fasting and 2 hours after largest meal) 1 kit 0   . cetirizine (ZYRTEC)  10 MG tablet Take 1 tablet (10 mg total) by mouth daily. 90 tablet 2 Past Week at Unknown time  . DULoxetine (CYMBALTA) 60 MG capsule TAKE 1 CAPSULE BY MOUTH DAILY (Patient taking differently: Take 60 mg by mouth daily. ) 90 capsule 1 Past Week at Unknown time  . fluticasone (FLONASE) 50 MCG/ACT nasal spray Place 1 spray into both nostrils daily. 16 g 2 unknown  . glucose blood (ONETOUCH VERIO) test strip Use to check blood sugars every morning fasting and 2 hours after largest meal (Patient taking differently: 1 each by Other route See admin instructions. Use to check blood sugars every morning fasting and 2 hours after largest meal) 200 each 3   . ibuprofen (ADVIL) 200 MG tablet Take 200 mg by mouth every 6 (six) hours as needed for moderate pain.   unknown  . insulin glargine (LANTUS) 100 UNIT/ML Solostar Pen Inject 15 Units into the skin at bedtime. (Patient taking differently: Inject 25 Units into the skin at bedtime. ) 15 mL prn Past Week at Unknown time  . Insulin Pen Needle (PEN NEEDLES) 31G X 8 MM MISC Use as instructed. Inject into the skin once nightly. (Patient taking differently: 1 each by Other route See admin instructions. Use as instructed. Inject into the skin once nightly.) 100 each 3   . ketoconazole (NIZORAL) 2  % cream Apply 1 application topically 2 (two) times daily. 15 g 2 Past Week at Unknown time  . Multiple Vitamins-Minerals (ONE-A-DAY WOMENS 50+ ADVANTAGE) TABS Take 1 tablet by mouth daily.   Past Week at Unknown time  . ondansetron (ZOFRAN) 4 MG tablet Take 1 tablet (4 mg total) by mouth every 8 (eight) hours as needed for nausea or vomiting. 20 tablet 1 Past Week at Unknown time  . ONE TOUCH LANCETS MISC Use to check blood sugars every morning fasting and 2 hours after largest meal (Patient taking differently: 1 each by Other route See admin instructions. Use to check blood sugars every morning fasting and 2 hours after largest meal) 200 each 3   . pregabalin (LYRICA) 75 MG capsule Take 1 capsule (75 mg total) by mouth 2 (two) times daily. 60 capsule 3 Past Week at Unknown time  . ticagrelor (BRILINTA) 90 MG TABS tablet Take 1 tablet (90 mg total) by mouth 2 (two) times daily. 60 tablet 0 unknown  . topiramate (TOPAMAX) 100 MG tablet Take 1 tablet (100 mg total) by mouth 2 (two) times daily. (Patient taking differently: Take 50 mg by mouth 2 (two) times daily. ) 180 tablet 3 Past Week at Unknown time   Scheduled: . [MAR Hold] aspirin  81 mg Oral Daily  . [MAR Hold] atorvastatin  80 mg Oral q1800  . [MAR Hold] clopidogrel  75 mg Oral Daily  . [MAR Hold] enoxaparin (LOVENOX) injection  40 mg Subcutaneous Q24H  . famotidine  20 mg Per Tube BID  . fentaNYL      . [MAR Hold] insulin aspart  0-5 Units Subcutaneous QHS  . [MAR Hold] insulin aspart  0-9 Units Subcutaneous TID WC  . [MAR Hold] insulin glargine  7 Units Subcutaneous QHS  . [MAR Hold] polyethylene glycol  17 g Oral Daily  . [MAR Hold] senna-docusate  2 tablet Oral BID   Continuous: . [MAR Hold] sodium chloride Stopped (11/12/2019 0619)  . sodium chloride 75 mL/hr at 11/18/2019 1145  . acetaminophen    . acetaminophen 1,000 mg (11/14/2019 1356)  . propofol (DIPRIVAN) infusion 50 mcg/kg/min (  10/27/2019 1628)   PRN:[MAR Hold] sodium  chloride, acetaminophen, acetaminophen **OR** acetaminophen (TYLENOL) oral liquid 160 mg/5 mL, docusate sodium, fentaNYL (SUBLIMAZE) injection, [MAR Hold] ondansetron, oxyCODONE **OR** oxyCODONE, polyethylene glycol Anti-infectives (From admission, onward)   Start     Dose/Rate Route Frequency Ordered Stop   11/19/2019 0600  ceFAZolin (ANCEF) IVPB 2g/100 mL premix       Note to Pharmacy: Send with pt to OR   2 g 200 mL/hr over 30 Minutes Intravenous On call 10/29/19 2207 11/05/2019 0649   11/20/2019 0000  ceFAZolin (ANCEF) IVPB 2g/100 mL premix  Status:  Discontinued       Note to Pharmacy: Send with pt to OR   2 g 200 mL/hr over 30 Minutes Intravenous On call 10/29/19 0957 10/29/19 2207   11/13/2019 1345  cefTRIAXone (ROCEPHIN) 1 g in sodium chloride 0.9 % 100 mL IVPB        1 g 200 mL/hr over 30 Minutes Intravenous  Once 11/05/2019 1339 10/26/2019 1439       Results for orders placed or performed during the hospital encounter of 11/03/2019 (from the past 48 hour(s))  Glucose, capillary     Status: Abnormal   Collection Time: 10/28/19  4:34 PM  Result Value Ref Range   Glucose-Capillary 191 (H) 70 - 99 mg/dL    Comment: Glucose reference range applies only to samples taken after fasting for at least 8 hours.  Glucose, capillary     Status: Abnormal   Collection Time: 10/28/19  9:05 PM  Result Value Ref Range   Glucose-Capillary 157 (H) 70 - 99 mg/dL    Comment: Glucose reference range applies only to samples taken after fasting for at least 8 hours.  CBC     Status: None   Collection Time: 10/29/19  2:21 AM  Result Value Ref Range   WBC 9.5 4.0 - 10.5 K/uL   RBC 4.05 3.87 - 5.11 MIL/uL   Hemoglobin 12.6 12.0 - 15.0 g/dL   HCT 39.8 36 - 46 %   MCV 98.3 80.0 - 100.0 fL   MCH 31.1 26.0 - 34.0 pg   MCHC 31.7 30.0 - 36.0 g/dL   RDW 12.3 11.5 - 15.5 %   Platelets 308 150 - 400 K/uL   nRBC 0.0 0.0 - 0.2 %    Comment: Performed at [facility], [place] [address], [place], [place] [address]   Comprehensive metabolic panel     Status: Abnormal   Collection Time: 10/29/19  2:21 AM  Result Value Ref Range   Sodium 139 135 - 145 mmol/L   Potassium 3.8 3.5 - 5.1 mmol/L   Chloride 105 98 - 111 mmol/L   CO2 22 22 - 32 mmol/L   Glucose, Bld 116 (H) 70 - 99 mg/dL    Comment: Glucose reference range applies only to samples taken after fasting for at least 8 hours.   BUN 23 (H) 6 - 20 mg/dL   Creatinine, Ser 1.23 (H) 0.44 - 1.00 mg/dL   Calcium 9.6 8.9 - 10.3 mg/dL   Total Protein 6.1 (L) 6.5 - 8.1 g/dL   Albumin 3.1 (L) 3.5 - 5.0 g/dL   AST 18 15 - 41 U/L   ALT 15 0 - 44 U/L   Alkaline Phosphatase 90 38 - 126 U/L   Total Bilirubin 0.8 0.3 - 1.2 mg/dL   GFR calc non Af Amer 50 (L) >60 mL/min   GFR calc Af Amer 58 (L) >60 mL/min  Anion gap 12 5 - 15    Comment: Performed at [facility] [address] [place], [place] [address]  Magnesium     Status: None   Collection Time: 10/29/19  2:21 AM  Result Value Ref Range   Magnesium 2.0 1.7 - 2.4 mg/dL    Comment: Performed at [facility] [address] [place], [place] [address]  Glucose, capillary     Status: Abnormal   Collection Time: 10/29/19 11:09 AM  Result Value Ref Range   Glucose-Capillary 152 (H) 70 - 99 mg/dL    Comment: Glucose reference range applies only to samples taken after fasting for at least 8 hours.  Glucose, capillary     Status: Abnormal   Collection Time: 10/29/19  4:28 PM  Result Value Ref Range   Glucose-Capillary 196 (H) 70 - 99 mg/dL    Comment: Glucose reference range applies only to samples taken after fasting for at least 8 hours.  Glucose, capillary     Status: Abnormal   Collection Time: 10/29/19  5:40 PM  Result Value Ref Range   Glucose-Capillary 154 (H) 70 - 99 mg/dL    Comment: Glucose reference range applies only to samples taken after fasting for at least 8 hours.  Glucose, capillary     Status: Abnormal   Collection Time: 10/29/19  9:24 PM  Result Value Ref Range    Glucose-Capillary 185 (H) 70 - 99 mg/dL    Comment: Glucose reference range applies only to samples taken after fasting for at least 8 hours.  Protime-INR     Status: None   Collection Time: 10/24/2019  4:17 AM  Result Value Ref Range   Prothrombin Time 12.5 11.4 - 15.2 seconds   INR 1.0 0.8 - 1.2    Comment: (NOTE) INR goal varies based on device and disease states. Performed at [facility], [place] [address], [place], [place] [address]   Basic metabolic panel     Status: Abnormal   Collection Time: 11/10/2019  4:17 AM  Result Value Ref Range   Sodium 140 135 - 145 mmol/L   Potassium 3.5 3.5 - 5.1 mmol/L   Chloride 107 98 - 111 mmol/L   CO2 22 22 - 32 mmol/L   Glucose, Bld 132 (H) 70 - 99 mg/dL    Comment: Glucose reference range applies only to samples taken after fasting for at least 8 hours.   BUN 26 (H) 6 - 20 mg/dL   Creatinine, Ser 1.31 (H) 0.44 - 1.00 mg/dL   Calcium 9.5 8.9 - 10.3 mg/dL   GFR calc non Af Amer 47 (L) >60 mL/min   GFR calc Af Amer 54 (L) >60 mL/min   Anion gap 11 5 - 15    Comment: Performed at [facility] [address] [place] [address]  CBC     Status: None   Collection Time: 10/26/2019  4:17 AM  Result Value Ref Range   WBC 9.1 4.0 - 10.5 K/uL   RBC 4.09 3.87 - 5.11 MIL/uL   Hemoglobin 13.0 12.0 - 15.0 g/dL   HCT 40.0 36 - 46 %   MCV 97.8 80.0 - 100.0 fL   MCH 31.8 26.0 - 34.0 pg   MCHC 32.5 30.0 - 36.0 g/dL   RDW 12.4 11.5 - 15.5 %   Platelets 318 150 - 400 K/uL   nRBC 0.0 0.0 - 0.2 %    Comment: Performed at [facility], [place] [address] [place], [place] [address]  ABO/Rh     Status: None   Collection Time: 10/22/2019  4:17 AM  Result Value Ref Range   ABO/RH(D)      A POS Performed at [facility] [address] [place], [place] [address]   Glucose, capillary     Status: Abnormal   Collection Time: 11/12/2019  6:05 AM  Result Value Ref Range   Glucose-Capillary 150 (H) 70 - 99 mg/dL    Comment: Glucose  reference range applies only to samples taken after fasting for at least 8 hours.  Type and screen     Status: None   Collection Time: 10/25/2019  7:30 AM  Result Value Ref Range   ABO/RH(D) A POS    Antibody Screen NEG    Sample Expiration      11/02/2019,2359 Performed at [facility], [place] [address] [place], [place] [address]   Glucose, capillary     Status: Abnormal   Collection Time: 10/24/2019 10:36 AM  Result Value Ref Range   Glucose-Capillary 203 (H) 70 - 99 mg/dL    Comment: Glucose reference range applies only to samples taken after fasting for at least 8 hours.  CBC     Status: Abnormal   Collection Time: 11/20/2019 11:22 AM  Result Value Ref Range   WBC 13.9 (H) 4.0 - 10.5 K/uL   RBC 3.82 (L) 3.87 - 5.11 MIL/uL   Hemoglobin 12.2 12.0 - 15.0 g/dL   HCT 37.6 36 - 46 %   MCV 98.4 80.0 - 100.0 fL   MCH 31.9 26.0 - 34.0 pg   MCHC 32.4 30.0 - 36.0 g/dL   RDW 12.4 11.5 - 15.5 %   Platelets 301 150 - 400 K/uL   nRBC 0.0 0.0 - 0.2 %    Comment: Performed at [facility], [place] [address], [place], [place] [address]  Creatinine, serum     Status: Abnormal   Collection Time: 11/06/2019 11:22 AM  Result Value Ref Range   Creatinine, Ser 1.14 (H) 0.44 - 1.00 mg/dL   GFR calc non Af Amer 55 (L) >60 mL/min   GFR calc Af Amer >60 >60 mL/min    Comment: Performed at [facility] [address], [place], [place] [address]  Glucose, capillary     Status: Abnormal   Collection Time: 11/16/2019  3:00 PM  Result Value Ref Range   Glucose-Capillary 176 (H) 70 - 99 mg/dL    Comment: Glucose reference range applies only to samples taken after fasting for at least 8 hours.   Comment 1 Notify RN    Comment 2 Document in Chart     CT Code Stroke CTA Head W/WO contrast  Result Date: 10/22/2019 CLINICAL DATA:  Left-sided weakness EXAM: CT HEAD WITHOUT CONTRAST CT ANGIOGRAPHY OF THE HEAD AND NECK TECHNIQUE: Contiguous axial images were obtained from the base of the skull through  the vertex without intravenous contrast. Multidetector CT imaging of the head and neck was performed using the standard protocol during bolus administration of intravenous contrast. Multiplanar CT image reconstructions and MIPs were obtained to evaluate the vascular anatomy. Carotid stenosis measurements (when applicable) are obtained utilizing NASCET criteria, using the distal internal carotid diameter as the denominator. CONTRAST:  76m OMNIPAQUE IOHEXOL 350 MG/ML SOLN COMPARISON:  Recent prior CT, CTA, MRI FINDINGS: CT HEAD Brain: New large area of hemorrhage primarily involving right parietotemporal lobes. There is a sulcal  subarachnoid extension as well as intraventricular extension within the lateral ventricles. Leftward midline shift measures approximately 9 mm. There is early trapping of the left lateral ventricle. There is also trapping of the right temporal horn with medialization of the right uncus. There are some areas of hypoattenuation corresponding to infarctions seen on recent MRI. Vascular: Unremarkable. Skull: Unremarkable. Sinuses/Orbits: No acute abnormality. CTA NECK Aortic arch: Plaque along the aortic arch and great vessel origins as before. Right carotid system: Common carotid is patent with calcified and noncalcified plaque. There is luminal narrowing from surrounding postoperative change. Interval stent placement spanning the bifurcation and proximal internal carotid. There remains luminal narrowing within the patent stent of greater than 50% relative to the distal cervical ICA. However, there is significant improvement compared to the prior study. Left carotid system: Common carotid is patent with calcified and noncalcified plaque. As before, there is critical stenosis of the distal left carotid bulb with no measurable residual lumen. Vertebral arteries:Patent with multifocal plaque. Origins are not well evaluated due to artifact. Skeleton: No new findings Other neck: Postoperative changes  of right trans carotid stent placement with soft tissue swelling and foci of gas. CTA HEAD Anterior circulation: Stable appearance of the intracranial internal carotid arteries with diffuse plaque and stenoses. Proximal right middle cerebral artery is patent. There is at least moderate stenosis of the proximal right M1 MCA. Left middle and both anterior cerebral arteries are patent. Atherosclerotic irregularity is again identified. Posterior circulation: Intracranial right vertebral artery is patent. Poor opacification of the intracranial left vertebral artery with severe stenosis proximally. Diminutive basilar is patent. Posterior cerebral arteries are patent and primarily supplied by posterior communicating arteries. Atherosclerotic irregularity again noted. Venous sinuses: Not well evaluated. IMPRESSION: New large right parietotemporal hemorrhage with intraventricular extension and mass effect including leftward midline shift and trapping of the right temporal horn and left lateral ventricle. May reflect hemorrhagic conversion of a posterior MCA territory infarction. No new large vessel occlusion. Postoperative changes of right trans carotid stent placement. There is some narrowing of the right common carotid from surrounding postoperative change. Right ICA stenosis is improved and stent is patent with persistent greater than 50% stenosis relative to distal ICA. Is These results were called by telephone at the time of interpretation on 10/29/2019 at 3:45 pm to provider [place] ; Ruta Hinds , who verbally acknowledged these results. Electronically Signed   By: Macy Mis M.D.   On: 11/09/2019 16:03   CT Code Stroke CTA Neck W/WO contrast  Result Date: 11/04/2019 CLINICAL DATA:  Left-sided weakness EXAM: CT HEAD WITHOUT CONTRAST CT ANGIOGRAPHY OF THE HEAD AND NECK TECHNIQUE: Contiguous axial images were obtained from the base of the skull through the vertex without intravenous contrast.  Multidetector CT imaging of the head and neck was performed using the standard protocol during bolus administration of intravenous contrast. Multiplanar CT image reconstructions and MIPs were obtained to evaluate the vascular anatomy. Carotid stenosis measurements (when applicable) are obtained utilizing NASCET criteria, using the distal internal carotid diameter as the denominator. CONTRAST:  74m OMNIPAQUE IOHEXOL 350 MG/ML SOLN COMPARISON:  Recent prior CT, CTA, MRI FINDINGS: CT HEAD Brain: New large area of hemorrhage primarily involving right parietotemporal lobes. There is a sulcal subarachnoid extension as well as intraventricular extension within the lateral ventricles. Leftward midline shift measures approximately 9 mm. There is early trapping of the left lateral ventricle. There is also trapping of the right temporal horn with medialization of the right uncus. There are  some areas of hypoattenuation corresponding to infarctions seen on recent MRI. Vascular: Unremarkable. Skull: Unremarkable. Sinuses/Orbits: No acute abnormality. CTA NECK Aortic arch: Plaque along the aortic arch and great vessel origins as before. Right carotid system: Common carotid is patent with calcified and noncalcified plaque. There is luminal narrowing from surrounding postoperative change. Interval stent placement spanning the bifurcation and proximal internal carotid. There remains luminal narrowing within the patent stent of greater than 50% relative to the distal cervical ICA. However, there is significant improvement compared to the prior study. Left carotid system: Common carotid is patent with calcified and noncalcified plaque. As before, there is critical stenosis of the distal left carotid bulb with no measurable residual lumen. Vertebral arteries:Patent with multifocal plaque. Origins are not well evaluated due to artifact. Skeleton: No new findings Other neck: Postoperative changes of right trans carotid stent placement  with soft tissue swelling and foci of gas. CTA HEAD Anterior circulation: Stable appearance of the intracranial internal carotid arteries with diffuse plaque and stenoses. Proximal right middle cerebral artery is patent. There is at least moderate stenosis of the proximal right M1 MCA. Left middle and both anterior cerebral arteries are patent. Atherosclerotic irregularity is again identified. Posterior circulation: Intracranial right vertebral artery is patent. Poor opacification of the intracranial left vertebral artery with severe stenosis proximally. Diminutive basilar is patent. Posterior cerebral arteries are patent and primarily supplied by posterior communicating arteries. Atherosclerotic irregularity again noted. Venous sinuses: Not well evaluated. IMPRESSION: New large right parietotemporal hemorrhage with intraventricular extension and mass effect including leftward midline shift and trapping of the right temporal horn and left lateral ventricle. May reflect hemorrhagic conversion of a posterior MCA territory infarction. No new large vessel occlusion. Postoperative changes of right trans carotid stent placement. There is some narrowing of the right common carotid from surrounding postoperative change. Right ICA stenosis is improved and stent is patent with persistent greater than 50% stenosis relative to distal ICA. Is These results were called by telephone at the time of interpretation on 10/29/2019 at 3:45 pm to provider [place] ; Ruta Hinds , who verbally acknowledged these results. Electronically Signed   By: Macy Mis M.D.   On: 11/05/2019 16:03   CT HEAD CODE STROKE WO CONTRAST  Result Date: 10/27/2019 CLINICAL DATA:  Left-sided weakness EXAM: CT HEAD WITHOUT CONTRAST CT ANGIOGRAPHY OF THE HEAD AND NECK TECHNIQUE: Contiguous axial images were obtained from the base of the skull through the vertex without intravenous contrast. Multidetector CT imaging of the head and neck was  performed using the standard protocol during bolus administration of intravenous contrast. Multiplanar CT image reconstructions and MIPs were obtained to evaluate the vascular anatomy. Carotid stenosis measurements (when applicable) are obtained utilizing NASCET criteria, using the distal internal carotid diameter as the denominator. CONTRAST:  68m OMNIPAQUE IOHEXOL 350 MG/ML SOLN COMPARISON:  Recent prior CT, CTA, MRI FINDINGS: CT HEAD Brain: New large area of hemorrhage primarily involving right parietotemporal lobes. There is a sulcal subarachnoid extension as well as intraventricular extension within the lateral ventricles. Leftward midline shift measures approximately 9 mm. There is early trapping of the left lateral ventricle. There is also trapping of the right temporal horn with medialization of the right uncus. There are some areas of hypoattenuation corresponding to infarctions seen on recent MRI. Vascular: Unremarkable. Skull: Unremarkable. Sinuses/Orbits: No acute abnormality. CTA NECK Aortic arch: Plaque along the aortic arch and great vessel origins as before. Right carotid system: Common carotid is patent with calcified and noncalcified  plaque. There is luminal narrowing from surrounding postoperative change. Interval stent placement spanning the bifurcation and proximal internal carotid. There remains luminal narrowing within the patent stent of greater than 50% relative to the distal cervical ICA. However, there is significant improvement compared to the prior study. Left carotid system: Common carotid is patent with calcified and noncalcified plaque. As before, there is critical stenosis of the distal left carotid bulb with no measurable residual lumen. Vertebral arteries:Patent with multifocal plaque. Origins are not well evaluated due to artifact. Skeleton: No new findings Other neck: Postoperative changes of right trans carotid stent placement with soft tissue swelling and foci of gas. CTA HEAD  Anterior circulation: Stable appearance of the intracranial internal carotid arteries with diffuse plaque and stenoses. Proximal right middle cerebral artery is patent. There is at least moderate stenosis of the proximal right M1 MCA. Left middle and both anterior cerebral arteries are patent. Atherosclerotic irregularity is again identified. Posterior circulation: Intracranial right vertebral artery is patent. Poor opacification of the intracranial left vertebral artery with severe stenosis proximally. Diminutive basilar is patent. Posterior cerebral arteries are patent and primarily supplied by posterior communicating arteries. Atherosclerotic irregularity again noted. Venous sinuses: Not well evaluated. IMPRESSION: New large right parietotemporal hemorrhage with intraventricular extension and mass effect including leftward midline shift and trapping of the right temporal horn and left lateral ventricle. May reflect hemorrhagic conversion of a posterior MCA territory infarction. No new large vessel occlusion. Postoperative changes of right trans carotid stent placement. There is some narrowing of the right common carotid from surrounding postoperative change. Right ICA stenosis is improved and stent is patent with persistent greater than 50% stenosis relative to distal ICA. Is These results were called by telephone at the time of interpretation on 11/13/2019 at 3:45 pm to provider [place] ; Ruta Hinds , who verbally acknowledged these results. Electronically Signed   By: Macy Mis M.D.   On: 10/22/2019 16:03   Structural Heart Procedure  Result Date: 10/24/2019 See surgical note for result.  HYBRID OR IMAGING ([facility] ONLY)  Result Date: 10/25/2019 There is no interpretation for this exam.  This order is for images obtained during a surgical procedure.  Please See "Surgeries" Tab for more information regarding the procedure.    ROS: Unobtainable Blood pressure 115/71, pulse 94, temperature 97.8  F (36.6 C), resp. rate (!) 27, SpO2 100 %. Estimated body mass index is 38.67 kg/m as calculated from the following:   Height as of 10/21/19: 5' 2"  (1.575 m).   Weight as of 10/21/19: 95.9 kg.  Physical Exam  General: An intubated, nonsedated, 54 year old obese white female  HEENT: Unremarkable  Neck: The patient has a right carotid artery surgery bandage  Thorax: Symmetric  Abdomen: Obese  Neurologic exam: Glasgow Coma Scale 7, intubated, [id number].  She abnormal reflexes on the right.  She is densely left hemiparetic.  The patient's pupils are equal.  She has corneal reflexes.  I have reviewed the patient's head CT performed at [facility] today.  It demonstrates a large right posterior parietal hemorrhage with significant mass-effect and midline shift.  There is brainstem compression and hypodensity, with central hyperintensity consistent with Duret hemorrhage.  There is no hydrocephalus.    Assessment/Plan: Right intracerebral hemorrhage: I have discussed the situation with Dr. Curly Shores and Dr. Ermalene Postin and subsequently with the patient's husband via the telephone.  I have explained that she is quite ill and will likely not survive.  We have discussed the various treatment  options including doing nothing, medical management, craniotomy for evacuation of the hematoma, ventriculostomy, etc.  Under the circumstances I am not very optimistic that surgery will help her and, at best, may leave her hemiplegic and nursing home bound.  I have answered all the patient's husband questions.  He has told me that under the circumstances his wife would not want to be kept alive.  He has asked that we keep her comfortable.  He is gathering his family members and on his way to the hospital.  Ophelia Charter 11/11/2019, 4:23 PM

## 2019-10-30 NOTE — Progress Notes (Signed)
Pt awake in PACU at neuro baseline with some confusion and left hand/arm weakness o/w neuro intact.    Right neck small hematoma pressure held for 5 min Right groin small amount of ooze but no hematoma  S//p right TCAR stent  Continue to observe neck and groin To stepdown soon  Ruta Hinds, MD [facility] [place] Office: [phone]

## 2019-10-30 NOTE — Progress Notes (Addendum)
Chrys Racer, RN called [facility]S at this time and spoke to [employer] on the phone.  Referral #: [id number] Coordinator: Esmond Harps

## 2019-10-30 NOTE — Progress Notes (Signed)
The patient has worsened clinically. Pupils now asymmetric and unreactive, right larger and irregular.   BP (!) 127/99   Pulse (!) 101   Temp 98.2 F (36.8 C)   Resp 17   SpO2 100%   I have called the patient's husband, Pamela Carney, and updated him on his wife's condition. I have informed him that a STAT CT head will not be obtained as it would not change management. He has expressed understanding and agreement and has been informed earlier today that the patient has an extremely poor prognosis. Her status was changed by family to DNR earlier this evening. The patient's husband will bring family to visit his wife in the ICU this tonight.   Electronically signed: Dr. Kerney Elbe

## 2019-10-30 NOTE — Anesthesia Procedure Notes (Signed)
Arterial Line Insertion Start/End08/19/2021 6:55 AM, 10/30/2019 7:05 AM Performed by: Lowella Dell, CRNA, CRNA  Patient location: Pre-op. Preanesthetic checklist: patient identified, IV checked, site marked, risks and benefits discussed, surgical consent, monitors and equipment checked, pre-op evaluation, timeout performed and anesthesia consent Lidocaine 1% used for infiltration Left, radial was placed Catheter size: 20 G Hand hygiene performed  and maximum sterile barriers used  Allen's test indicative of satisfactory collateral circulation Attempts: 1 Procedure performed without using ultrasound guided technique. Following insertion, dressing applied. Post procedure assessment: normal  Patient tolerated the procedure well with no immediate complications. Additional procedure comments: Inserted by Edmund Hilda, SRNA under direct supervision. Marland Kitchen

## 2019-10-30 NOTE — Progress Notes (Signed)
D/w Dr. Curly Shores who has communicated with family.  Family has decided for DNR status.  They are gathering other family members and then likely transition to comfort care.  DNR order placed.  Continue current therapies until remainder of family arrives.  Chesley Mires, MD [place] Pager - [phone] 10/30/2019, 6:02 PM

## 2019-10-30 NOTE — Anesthesia Procedure Notes (Addendum)
Procedure Name: Intubation Date/Time: 11/16/2019 7:51 AM Performed by: Lowella Dell, CRNA Pre-anesthesia Checklist: Patient identified, Emergency Drugs available, Suction available and Patient being monitored Patient Re-evaluated:Patient Re-evaluated prior to induction Oxygen Delivery Method: Circle System Utilized Preoxygenation: Pre-oxygenation with 100% oxygen Induction Type: IV induction Ventilation: Mask ventilation without difficulty Laryngoscope Size: Mac and 3 Grade View: Grade I Tube type: Oral Tube size: 7.0 mm Number of attempts: 1 Airway Equipment and Method: Stylet Placement Confirmation: ETT inserted through vocal cords under direct vision,  positive ETCO2 and breath sounds checked- equal and bilateral Secured at: 21 cm Tube secured with: Tape Dental Injury: Teeth and Oropharynx as per pre-operative assessment  Comments: Airway performed by [facility]

## 2019-10-30 NOTE — Progress Notes (Signed)
Pt was transferred to 4N from PACU via ventilator with no complications noted. RT will continue to monitor.

## 2019-10-30 NOTE — Progress Notes (Signed)
OT Cancellation Note  Patient Details Name: Pamela Carney MRN: [id number] DOB: 01/12/1966   Cancelled Treatment:    Reason Eval/Treat Not Completed: Patient at procedure or test/ unavailable, off unit at procedure.  Will follow up and see as able.   Jolaine Artist, OT Acute Rehabilitation Services Pager [phone] Office [phone]   Delight Stare 11/09/2019, 7:45 AM

## 2019-10-30 NOTE — Op Note (Signed)
Procedure: Trans carotid artery revascularization (TCAR) right carotid stent  Preoperative diagnosis: symptomatic right internal carotid artery stenosis  Postoperative diagnosis: Same  Anesthesia: Gen.  Assistant: Karoline Caldwell PA. necessary for expediting procedure and manipulation of wires/catheters  Indications: Patient is an 54 year old female with > 80% right internal carotid artery with contralateral 75% stenosis  Operative findings: #1 80% right internal carotid artery stenosis stented to residual 0% stenosis (7 x 40 mm ENROUTE stent)  #2 right femoral venous access  #3.  No significant intracranial disease  Operative details: After obtaining informed consent, the patient was taken the operating room. The patient was placed in supine position upper table. After induction of general anesthesia and endotracheal intubation, the patient was inspected with ultrasound on the right neck. At this point the entire right neck and chest were prepped and draped in usual sterile fashion. The right groin was also prepped and draped in usual sterile fashion. A time out was performed. Next a oblique incision was made between the heads of the sternocleidomastoid muscle on the right side of the neck. Incision was carried down through the platysma to the level of the right internal jugular vein. This was reflected laterally. The vagus nerve was identified and protected. Common carotid artery was dissected free circumferentially at the base of the incision. This was elevated up in the operative field with an vessel loop. Approximately 3-4 cm of the artery was dissected free circumferentially to allow adequate exposure. A pursestring suture was placed in the artery at the area we were planning to puncture using a running 6-0 Prolene suture.At this point femoral venous access was established via the right groin. Ultrasound was used to identify the right common femoral vein. An introducer needle was used to  cannulate the right common femoral vein and an 035 Bentsen wire advanced into the vein and the flow reversal sheath placed over this. It was thoroughly flushed with heparinized saline. The patient was given 10,000 units of total heparin to establish an ACT greater than 250. At this point a micropuncture needle was used to cannulate the right common carotid artery. The micropuncture wire was advanced just below the level of the carotid bifurcation. The micropuncture sheath was then advanced over this about 3 cm into the common carotid artery. This was thoroughly flushed with heparinized saline.a contrast angiogram was then performed in AP an 23 degree RAO projection to confirm that we were truly intraluminal with the sheath. This was also used to determine the level of the carotid bifurcation.  There was a greater than 80% stenosis in the mid right cervical internal carotid artery no significant stenosis was seen in the distal common carotid artery all the way up to its terminal branches in multiple views.  The carotid Amplatz wire was then placed through the micropuncture sheath and the sheath removed. The 8 French flow reversal arterial sheath was then advanced over the guidewire and into the common carotid artery. Sheath was sutured to the skin with 3 2-0 silk sutures.Contrast angiogram was again performed to make sure that we were within the true lumen. This was done in 2 views.  The filter device was switched to the low-flow selection. The flow reversal system was then hooked up to the arterial end of the sheath after everything had been thoroughly flushed and de-aired. Passive flow was used to fill the arterial and of the filter and through the filter and up to the level of the venous sheath. The venous sheath was also fully  de-aired and passive flow was established. This was checked by saline infusion in the venous port and noted to flush easily. High flow was then turned on on the filter device. At this  point a TCAR timeout was performed to make sure that the patient's blood pressure was reasonable. It was systolic 676 at this point. We again confirmed that the ACT was above 250. The balloon and guidewire insufflator and stent were all selected. These were all prepped. A 5 x 40 mm angioplasty balloon had been selected based on the preoperative CT. Based on the angiogram intraoperatively as well as preoperative CT and 7 x 40 mm Enroute stent was selected. The 014 wire was slightly shaped opening and due to slight curvature of the internal carotid artery. This was advanced with the balloon over it into the common carotid artery and a guidewire used to selectively catheterize the right internal carotid artery. The lesion was moderately calcified.We confirmed that we were within the internal carotid artery by first establishing that the guidewire advanced into the petrous portion of the internal carotid artery. At this point the 5 x 40 balloon was centered on the lesion and inflated to 6 atm slowly inflating and deflating the balloon. After predilatation a contrast angiogram was again performed which showed some improvement in the stenosis. We then brought up the 7 x 40 Enroute stent and advanced and centered this on the lesion.  This was then deployed using a pinch technique after opening the Tuohy Borst valve.  We then gave the patient 3 minutes of additional flow reversal before performing a completion angiogram.  Completion angiogram showed no residual waist.  An AP and lateral angiogram were performed to confirm that the stent was fully deployed.  There was no dissection and the stent was well opposed.  At this point the guidewire was removed. The flow reversal system was clamped off at the arterial sheath and disconnected. The remaining blood was returned to the patient. This was then disconnected from the venous sheath. The arterial sheath was removed and the pursestring suture cinched down. The patient was  given 100 mg of protamine to achieve hemostasis. The venous sheath was pulled and hemostasis obtained with direct pressure. The carotid was inspected found to be without any area of hematoma or active bleeding. The platysma muscle was reapproximated using a running 3-0 Vicryl suture. The skin was closed with a 4-0 Vicryl subcuticular stitch. Dermabond was applied to the incision. Patient was awakened in the operating room and moving her upper and lower extremities symmetrically. She was taken to the recovery room in stable condition.  Ruta Hinds, MD [facility] [place] Office: [phone] Pager: [phone]

## 2019-10-30 NOTE — Interval H&P Note (Signed)
History and Physical Interval Note:  11/07/2019 7:28 AM  Pamela Carney  has presented today for surgery, with the diagnosis of BILATERAL CAROTID ARTERY STENOSIS.  The various methods of treatment have been discussed with the patient and family. After consideration of risks, benefits and other options for treatment, the patient has consented to  Procedure(s): TRANSCAROTID ARTERY REVASCULARIZATION (Right) as a surgical intervention.  The patient's history has been reviewed, patient examined, no change in status, stable for surgery.  I have reviewed the patient's chart and labs.  Questions were answered to the patient's satisfaction.     Ruta Hinds

## 2019-10-30 NOTE — Anesthesia Procedure Notes (Signed)
Arterial Line Insertion  Patient location: Pre-op. Preanesthetic checklist: patient identified, IV checked, site marked, risks and benefits discussed, surgical consent, monitors and equipment checked, pre-op evaluation, timeout performed and anesthesia consent Lidocaine 1% used for infiltration Left, radial was placed Catheter size: 20 G Hand hygiene performed  and maximum sterile barriers used   Attempts: 1 Procedure performed without using ultrasound guided technique. Following insertion, dressing applied and Biopatch. Post procedure assessment: normal and unchanged  Patient tolerated the procedure well with no immediate complications.

## 2019-10-31 ENCOUNTER — Encounter (HOSPITAL_COMMUNITY): Payer: Self-pay | Admitting: Vascular Surgery

## 2019-10-31 ENCOUNTER — Ambulatory Visit: Payer: 59 | Admitting: Nurse Practitioner

## 2019-10-31 MED ORDER — GLYCOPYRROLATE 0.2 MG/ML IJ SOLN: 0.2000 mg | INTRAMUSCULAR | Status: DC | PRN

## 2019-10-31 MED ORDER — MORPHINE SULFATE (PF) 2 MG/ML IV SOLN: 2.0000 mg | INTRAVENOUS | Status: DC | PRN

## 2019-10-31 MED ORDER — ACETAMINOPHEN 325 MG PO TABS: 650.0000 mg | ORAL_TABLET | Freq: Four times a day (QID) | ORAL | Status: DC | PRN

## 2019-10-31 MED ORDER — MORPHINE BOLUS VIA INFUSION
5.0000 mg | INTRAVENOUS | Status: DC | PRN
  Filled 2019-10-31: qty 5

## 2019-10-31 MED ORDER — POLYVINYL ALCOHOL 1.4 % OP SOLN
1.0000 [drp] | Freq: Four times a day (QID) | OPHTHALMIC | Status: DC | PRN
  Filled 2019-10-31: qty 15

## 2019-10-31 MED ORDER — MORPHINE 100MG IN NS 100ML (1MG/ML) PREMIX INFUSION
0.0000 mg/h | INTRAVENOUS | Status: DC
  Administered 2019-10-31: 5 mg/h via INTRAVENOUS
  Filled 2019-10-31: qty 100

## 2019-10-31 MED ORDER — MIDAZOLAM HCL 2 MG/2ML IJ SOLN
2.0000 mg | INTRAMUSCULAR | Status: DC | PRN
  Administered 2019-10-31: 2 mg via INTRAVENOUS
  Filled 2019-10-31: qty 4

## 2019-10-31 MED ORDER — GLYCOPYRROLATE 1 MG PO TABS
1.0000 mg | ORAL_TABLET | ORAL | Status: DC | PRN
  Filled 2019-10-31: qty 1

## 2019-10-31 MED ORDER — DEXTROSE 5 % IV SOLN: INTRAVENOUS | Status: DC

## 2019-10-31 MED ORDER — ACETAMINOPHEN 650 MG RE SUPP: 650.0000 mg | Freq: Four times a day (QID) | RECTAL | Status: DC | PRN

## 2019-10-31 MED ORDER — DIPHENHYDRAMINE HCL 50 MG/ML IJ SOLN: 25.0000 mg | INTRAMUSCULAR | Status: DC | PRN

## 2019-11-03 ENCOUNTER — Ambulatory Visit: Payer: 59 | Admitting: Vascular Surgery

## 2019-11-04 NOTE — Anesthesia Postprocedure Evaluation (Signed)
Anesthesia Post Note  Patient: MAYTE DIERS  Procedure(s) Performed: TRANSCAROTID ARTERY REVASCULARIZATION (Right Neck) ULTRASOUND GUIDANCE FOR VASCULAR ACCESS (Right Groin)     Patient location during evaluation: PACU Anesthesia Type: General Level of consciousness: awake Pain management: pain level controlled Vital Signs Assessment: post-procedure vital signs reviewed and stable Respiratory status: spontaneous breathing, nonlabored ventilation, respiratory function stable and patient connected to nasal cannula oxygen Cardiovascular status: blood pressure returned to baseline and stable Postop Assessment: no apparent nausea or vomiting Anesthetic complications: no Comments: Patient with acute left sided paralysis and altered mentation. Code stroke called and patient taken to CT. Found to have large ICH, intubated for airway protection and admitted to ICU   No complications documented.  Last Vitals:  Vitals:   Nov 11, 2019 0100 Nov 11, 2019 0114  BP:    Pulse:    Resp:    Temp:    SpO2: (!) 54% (!) 69%    Last Pain:  Vitals:   2019-11-11 0000  TempSrc: Axillary  PainSc:                  Juaquina Machnik

## 2019-11-16 ENCOUNTER — Ambulatory Visit: Payer: Medicaid Other | Admitting: Podiatry

## 2019-11-22 NOTE — Progress Notes (Deleted)
     11/12/2019 Pamela Carney [id number] December 19, 1965   Chief Complaint:  History of Present Illness: Pamela Carney is a 54 year old female with a past medical history of anxiety, depression, bipolar disorder,  hypertension, CVA 11/2018 on ASA and Plavix, DM II, CKD state III, kidney stones, sleep apnea, GERD   She was initially seen in our office by Dr. Tarri Glenn on 04/03/2019 for further evaluation for constipation.   Colonoscopy 05/19/2019: - Hemorrhoids found on perianal exam. - Diverticulosis in the entire examined colon. - One less than 1 mm polyp in the distal sigmoid colon, removed with a cold snare. Resected and retrieved. Surgical [P], colon, sigmoid and hepatic flexure, polyp (2) - TUBULAR ADENOMA, NEGATIVE FOR HIGH GRADE DYSPLASIA (X3). - UNINVOLVED COLONIC MUCOSA (X1).  Past Medical History:  Diagnosis Date  . Abnormal Pap smear   . AKI (acute kidney injury) ([place]) 12/28/2017  . Anxiety   . Arthritis    bil knees, neck  . Bipolar 1 disorder ([place])   . Cataract    Mild  . Cholelithiasis 12/23/2017   noted on CT renal, pt unaware  . Chronic kidney disease (CKD), stage III (moderate)   . Cystitis   . Depression   . Diabetes mellitus without complication ([place])    type 2  . Genital warts    Hx of genital  . GERD (gastroesophageal reflux disease)   . Heart murmur    childhood  . Hematuria   . History of blood transfusion   . History of ectopic pregnancy   . History of gestational diabetes   . History of kidney stones   . History of sepsis    after ectopic pregnancy  . Hyperlipidemia   . Hypertension   . Idiopathic peripheral neuropathy    both feet  . Leg ulcer, left ([place])   . Low back pain   . Migraines   . Obesity   . Oral candidiasis   . Pneumonia   . PONV (postoperative nausea and vomiting)    prolonged sedation  . Recurrent UTI   . Renal calculi 12/23/2017   Multiple bilateral nonobstructing, 2.2 cm lower pole partial staghorn on left, noted on  CT renal  . Sigmoid diverticulosis 12/23/2017   noted on CT renal, pt unaware  . Sleep apnea    uses cpap  . Stroke [facility])    Cryptogenic  . Ulcer of foot ([place])    Left  . Weakness       Current Medications, Allergies, Past Medical History, Past Surgical History, Family History and Social History were reviewed in [employer] record.   Physical Exam: LMP  (LMP Unknown)  General: Well developed, w   ***female in no acute distress. Head: Normocephalic and atraumatic. Eyes: No scleral icterus. Conjunctiva pink . Ears: Normal auditory acuity. Mouth: Dentition intact. No ulcers or lesions.  Lungs: Clear throughout to auscultation. Heart: Regular rate and rhythm, no murmur. Abdomen: Soft, nontender and nondistended. No masses or hepatomegaly. Normal bowel sounds x 4 quadrants.  Rectal: *** Musculoskeletal: Symmetrical with no gross deformities. Extremities: No edema. Neurological: Alert oriented x 4. No focal deficits.  Psychological: Alert and cooperative. Normal mood and affect  Assessment and Recommendations: ***

## 2019-11-22 NOTE — Procedures (Signed)
Extubation Procedure Note  Patient Details:   Name: Pamela Carney DOB: 06/06/1965 MRN: [id number]   Airway Documentation:  Airway 7.5 mm (Active)  Secured at (cm) 25 cm 11/15/2019 2104  Measured From Lips 10/28/2019 2104  [place] 10/24/2019 2104  Secured By [employer] 11/19/2019 2104  Tube Holder Repositioned Yes 10/28/2019 2104  Site Condition Dry 11/05/2019 2104   Vent end date: (not recorded) Vent end time: (not recorded)   Evaluation  O2 sats: currently acceptable Complications: No apparent complications Patient did tolerate procedure well. Bilateral Breath Sounds: Clear, Diminished   No   Terminal extubation per MD order.  Patsy Baltimore Nadra Hritz 11-04-19, 12:53 AM

## 2019-11-22 NOTE — Progress Notes (Signed)
[facility] Progress Note Patient Name: Pamela Carney DOB: 1966-02-28 MRN: [id number]   Date of Service  Nov 02, 2019  HPI/Events of Note  Spoke with Shelva Majestic who is the husband of the patient. He states that given his wife's extensive neurological insult and poor prognosis for neurological recovery, he and the family wish to move to comfort measures and allow his wife to pass with comfort and dignity.  eICU Interventions  Will precede with comfort measures via the PCCM withdrawal of life sustaining treatment protocol order set.         [name] 11/02/19, 12:30 AM

## 2019-11-22 NOTE — Death Summary Note (Signed)
Date of Death: Nov 20, 2019 Time of Death: 0255  Patient asystole at 0255 after withdrawal of care at 0050. No heart beat upon auscultation.   2 RN's that verified cardiac death: Alger Memos, RN & Montez Hageman, RN  Spouse, Shelva Majestic, at bedside when TOD was called.   CDS called with TOD, patient potential eye and tissue donor. Ref # [id number]  Medical Examiner, Izora Ribas, contacted regarding patient as a possible ME case. Patient is not considered a ME case.  Attending physician, Cheral Marker MD, notified of patient's death at 80.

## 2019-11-22 DEATH — deceased

## 2019-11-23 ENCOUNTER — Encounter (HOSPITAL_COMMUNITY): Payer: 59

## 2019-11-23 ENCOUNTER — Encounter: Payer: 59 | Admitting: Vascular Surgery

## 2019-12-22 NOTE — Death Summary Note (Signed)
DEATH SUMMARY   Patient Details  Name: Pamela Carney MRN: [id number] DOB: 1965-09-10  Admission/Discharge Information   Admit Date:  11/02/2019  Date of Death: Date of Death: 11-10-2019  Time of Death: Time of Death: 0255  Length of Stay: 06/08/22  Referring Physician: [name], NP   Reason(s) for Hospitalization  Acute stroke  Diagnoses  Preliminary cause of death: Intracranial hemorrhage Secondary Diagnoses (including complications and co-morbidities):  Principal Problem:   Symptomatic carotid artery stenosis Active Problems:   CKD (chronic kidney disease), stage III   Type 2 diabetes mellitus with other specified complication ([place])   Bipolar 1 disorder ([place])   Obesity   Chronic mesenteric ischemia ([place])   ICH (intracerebral hemorrhage) ([place])   Pressure injury of skin   Carotid stenosis, bilateral Acute stroke  Brief Hospital Course (including significant findings, care, treatment, and services provided and events leading to death)  Pamela Carney is a 54 y.o. year old female who was admitted 11/02/2019. She was initially seen by neurology found to have bilateral severe carotid stenosis. She was found to have acute stroke. Her main symptoms were confusion as well as left upper extremity weakness. She was placed on aspirin Plavix and a statin. She still has some baseline confusion but was improving overall. She was thought to be neurologically stable. She underwent right transcarotid stenting on October 30, 2019. In the recovery room she was noticed to have declining neurologic function and was emergently evaluated with a code stroke. She was noted on head CT to have a large intracranial hemorrhage. She was evaluated by neurology and neurosurgery and thought that she most likely would not have meaningful recovery. The family at that point decided to make her comfort care.. She expired early on the morning of 11/10/2022.    Pertinent Labs and Studies  Significant Diagnostic  Studies CT Code Stroke CTA Head W/WO contrast  Result Date: 11/01/2019 CLINICAL DATA:  Left-sided weakness EXAM: CT HEAD WITHOUT CONTRAST CT ANGIOGRAPHY OF THE HEAD AND NECK TECHNIQUE: Contiguous axial images were obtained from the base of the skull through the vertex without intravenous contrast. Multidetector CT imaging of the head and neck was performed using the standard protocol during bolus administration of intravenous contrast. Multiplanar CT image reconstructions and MIPs were obtained to evaluate the vascular anatomy. Carotid stenosis measurements (when applicable) are obtained utilizing NASCET criteria, using the distal internal carotid diameter as the denominator. CONTRAST:  49mL OMNIPAQUE IOHEXOL 350 MG/ML SOLN COMPARISON:  Recent prior CT, CTA, MRI FINDINGS: CT HEAD Brain: New large area of hemorrhage primarily involving right parietotemporal lobes. There is a sulcal subarachnoid extension as well as intraventricular extension within the lateral ventricles. Leftward midline shift measures approximately 9 mm. There is early trapping of the left lateral ventricle. There is also trapping of the right temporal horn with medialization of the right uncus. There are some areas of hypoattenuation corresponding to infarctions seen on recent MRI. Vascular: Unremarkable. Skull: Unremarkable. Sinuses/Orbits: No acute abnormality. CTA NECK Aortic arch: Plaque along the aortic arch and great vessel origins as before. Right carotid system: Common carotid is patent with calcified and noncalcified plaque. There is luminal narrowing from surrounding postoperative change. Interval stent placement spanning the bifurcation and proximal internal carotid. There remains luminal narrowing within the patent stent of greater than 50% relative to the distal cervical ICA. However, there is significant improvement compared to the prior study. Left carotid system: Common carotid is patent with calcified and noncalcified  plaque. As  before, there is critical stenosis of the distal left carotid bulb with no measurable residual lumen. Vertebral arteries:Patent with multifocal plaque. Origins are not well evaluated due to artifact. Skeleton: No new findings Other neck: Postoperative changes of right trans carotid stent placement with soft tissue swelling and foci of gas. CTA HEAD Anterior circulation: Stable appearance of the intracranial internal carotid arteries with diffuse plaque and stenoses. Proximal right middle cerebral artery is patent. There is at least moderate stenosis of the proximal right M1 MCA. Left middle and both anterior cerebral arteries are patent. Atherosclerotic irregularity is again identified. Posterior circulation: Intracranial right vertebral artery is patent. Poor opacification of the intracranial left vertebral artery with severe stenosis proximally. Diminutive basilar is patent. Posterior cerebral arteries are patent and primarily supplied by posterior communicating arteries. Atherosclerotic irregularity again noted. Venous sinuses: Not well evaluated. IMPRESSION: New large right parietotemporal hemorrhage with intraventricular extension and mass effect including leftward midline shift and trapping of the right temporal horn and left lateral ventricle. May reflect hemorrhagic conversion of a posterior MCA territory infarction. No new large vessel occlusion. Postoperative changes of right trans carotid stent placement. There is some narrowing of the right common carotid from surrounding postoperative change. Right ICA stenosis is improved and stent is patent with persistent greater than 50% stenosis relative to distal ICA. Is These results were called by telephone at the time of interpretation on 11/17/2019 at 3:45 pm to provider [place] ; Ruta Hinds , who verbally acknowledged these results. Electronically Signed   By: Macy Mis M.D.   On: 11/02/2019 16:03   CT Code Stroke CTA Neck W/WO  contrast  Result Date: 11/21/2019 CLINICAL DATA:  Left-sided weakness EXAM: CT HEAD WITHOUT CONTRAST CT ANGIOGRAPHY OF THE HEAD AND NECK TECHNIQUE: Contiguous axial images were obtained from the base of the skull through the vertex without intravenous contrast. Multidetector CT imaging of the head and neck was performed using the standard protocol during bolus administration of intravenous contrast. Multiplanar CT image reconstructions and MIPs were obtained to evaluate the vascular anatomy. Carotid stenosis measurements (when applicable) are obtained utilizing NASCET criteria, using the distal internal carotid diameter as the denominator. CONTRAST:  36mL OMNIPAQUE IOHEXOL 350 MG/ML SOLN COMPARISON:  Recent prior CT, CTA, MRI FINDINGS: CT HEAD Brain: New large area of hemorrhage primarily involving right parietotemporal lobes. There is a sulcal subarachnoid extension as well as intraventricular extension within the lateral ventricles. Leftward midline shift measures approximately 9 mm. There is early trapping of the left lateral ventricle. There is also trapping of the right temporal horn with medialization of the right uncus. There are some areas of hypoattenuation corresponding to infarctions seen on recent MRI. Vascular: Unremarkable. Skull: Unremarkable. Sinuses/Orbits: No acute abnormality. CTA NECK Aortic arch: Plaque along the aortic arch and great vessel origins as before. Right carotid system: Common carotid is patent with calcified and noncalcified plaque. There is luminal narrowing from surrounding postoperative change. Interval stent placement spanning the bifurcation and proximal internal carotid. There remains luminal narrowing within the patent stent of greater than 50% relative to the distal cervical ICA. However, there is significant improvement compared to the prior study. Left carotid system: Common carotid is patent with calcified and noncalcified plaque. As before, there is critical stenosis of  the distal left carotid bulb with no measurable residual lumen. Vertebral arteries:Patent with multifocal plaque. Origins are not well evaluated due to artifact. Skeleton: No new findings Other neck: Postoperative changes of right trans carotid  stent placement with soft tissue swelling and foci of gas. CTA HEAD Anterior circulation: Stable appearance of the intracranial internal carotid arteries with diffuse plaque and stenoses. Proximal right middle cerebral artery is patent. There is at least moderate stenosis of the proximal right M1 MCA. Left middle and both anterior cerebral arteries are patent. Atherosclerotic irregularity is again identified. Posterior circulation: Intracranial right vertebral artery is patent. Poor opacification of the intracranial left vertebral artery with severe stenosis proximally. Diminutive basilar is patent. Posterior cerebral arteries are patent and primarily supplied by posterior communicating arteries. Atherosclerotic irregularity again noted. Venous sinuses: Not well evaluated. IMPRESSION: New large right parietotemporal hemorrhage with intraventricular extension and mass effect including leftward midline shift and trapping of the right temporal horn and left lateral ventricle. May reflect hemorrhagic conversion of a posterior MCA territory infarction. No new large vessel occlusion. Postoperative changes of right trans carotid stent placement. There is some narrowing of the right common carotid from surrounding postoperative change. Right ICA stenosis is improved and stent is patent with persistent greater than 50% stenosis relative to distal ICA. Is These results were called by telephone at the time of interpretation on 11/21/2019 at 3:45 pm to provider [place] ; Ruta Hinds , who verbally acknowledged these results. Electronically Signed   By: Macy Mis M.D.   On: 11/19/2019 16:03   DG CHEST PORT 1 VIEW  Result Date: 11/12/2019 CLINICAL DATA:  Intubated EXAM:  PORTABLE CHEST 1 VIEW COMPARISON:  12/15/2018 FINDINGS: Endotracheal tube is 2.3 cm above the carina. Heart is borderline in size. Bibasilar densities likely reflect atelectasis. No effusions or pneumothorax. No acute bony abnormality. Loop recorder device projects over the left chest. IMPRESSION: Endotracheal tube 2.3 cm above the carina. Bibasilar linear densities, likely atelectasis. Electronically Signed   By: Rolm Baptise M.D.   On: 10/26/2019 17:27   CT HEAD CODE STROKE WO CONTRAST  Result Date: 10/30/2019 CLINICAL DATA:  Left-sided weakness EXAM: CT HEAD WITHOUT CONTRAST CT ANGIOGRAPHY OF THE HEAD AND NECK TECHNIQUE: Contiguous axial images were obtained from the base of the skull through the vertex without intravenous contrast. Multidetector CT imaging of the head and neck was performed using the standard protocol during bolus administration of intravenous contrast. Multiplanar CT image reconstructions and MIPs were obtained to evaluate the vascular anatomy. Carotid stenosis measurements (when applicable) are obtained utilizing NASCET criteria, using the distal internal carotid diameter as the denominator. CONTRAST:  66mL OMNIPAQUE IOHEXOL 350 MG/ML SOLN COMPARISON:  Recent prior CT, CTA, MRI FINDINGS: CT HEAD Brain: New large area of hemorrhage primarily involving right parietotemporal lobes. There is a sulcal subarachnoid extension as well as intraventricular extension within the lateral ventricles. Leftward midline shift measures approximately 9 mm. There is early trapping of the left lateral ventricle. There is also trapping of the right temporal horn with medialization of the right uncus. There are some areas of hypoattenuation corresponding to infarctions seen on recent MRI. Vascular: Unremarkable. Skull: Unremarkable. Sinuses/Orbits: No acute abnormality. CTA NECK Aortic arch: Plaque along the aortic arch and great vessel origins as before. Right carotid system: Common carotid is patent with  calcified and noncalcified plaque. There is luminal narrowing from surrounding postoperative change. Interval stent placement spanning the bifurcation and proximal internal carotid. There remains luminal narrowing within the patent stent of greater than 50% relative to the distal cervical ICA. However, there is significant improvement compared to the prior study. Left carotid system: Common carotid is patent with calcified and noncalcified plaque. As before,  there is critical stenosis of the distal left carotid bulb with no measurable residual lumen. Vertebral arteries:Patent with multifocal plaque. Origins are not well evaluated due to artifact. Skeleton: No new findings Other neck: Postoperative changes of right trans carotid stent placement with soft tissue swelling and foci of gas. CTA HEAD Anterior circulation: Stable appearance of the intracranial internal carotid arteries with diffuse plaque and stenoses. Proximal right middle cerebral artery is patent. There is at least moderate stenosis of the proximal right M1 MCA. Left middle and both anterior cerebral arteries are patent. Atherosclerotic irregularity is again identified. Posterior circulation: Intracranial right vertebral artery is patent. Poor opacification of the intracranial left vertebral artery with severe stenosis proximally. Diminutive basilar is patent. Posterior cerebral arteries are patent and primarily supplied by posterior communicating arteries. Atherosclerotic irregularity again noted. Venous sinuses: Not well evaluated. IMPRESSION: New large right parietotemporal hemorrhage with intraventricular extension and mass effect including leftward midline shift and trapping of the right temporal horn and left lateral ventricle. May reflect hemorrhagic conversion of a posterior MCA territory infarction. No new large vessel occlusion. Postoperative changes of right trans carotid stent placement. There is some narrowing of the right common carotid  from surrounding postoperative change. Right ICA stenosis is improved and stent is patent with persistent greater than 50% stenosis relative to distal ICA. Is These results were called by telephone at the time of interpretation on 11/21/2019 at 3:45 pm to provider [place] ; Ruta Hinds , who verbally acknowledged these results. Electronically Signed   By: Macy Mis M.D.   On: 10/22/2019 16:03    Microbiology No results found for this or any previous visit (from the past 240 hour(s)).  Lab Basic Metabolic Panel: No results for input(s): NA, K, CL, CO2, GLUCOSE, BUN, CREATININE, CALCIUM, MG, PHOS in the last 168 hours. Liver Function Tests: No results for input(s): AST, ALT, ALKPHOS, BILITOT, PROT, ALBUMIN in the last 168 hours. No results for input(s): LIPASE, AMYLASE in the last 168 hours. No results for input(s): AMMONIA in the last 168 hours. CBC: No results for input(s): WBC, NEUTROABS, HGB, HCT, MCV, PLT in the last 168 hours. Cardiac Enzymes: No results for input(s): CKTOTAL, CKMB, CKMBINDEX, TROPONINI in the last 168 hours. Sepsis Labs: No results for input(s): PROCALCITON, WBC, LATICACIDVEN in the last 168 hours.  Procedures/Operations  Right transcarotid stenting   Ruta Hinds 11/29/2019, 3:19 PM

## 2019-12-25 ENCOUNTER — Ambulatory Visit: Payer: 59 | Admitting: Nurse Practitioner

## 2019-12-28 ENCOUNTER — Ambulatory Visit: Payer: 59 | Admitting: Adult Health

## 2020-01-15 ENCOUNTER — Ambulatory Visit: Payer: Medicaid Other | Admitting: Podiatry
